# Patient Record
Sex: Female | Born: 1945
Health system: Southern US, Community
[De-identification: ages and names within clinical notes are randomized; demographics above are authoritative.]

## PROBLEM LIST (undated history)

## (undated) DIAGNOSIS — C801 Malignant (primary) neoplasm, unspecified: Secondary | ICD-10-CM

## (undated) DIAGNOSIS — J449 Chronic obstructive pulmonary disease, unspecified: Secondary | ICD-10-CM

## (undated) DIAGNOSIS — F32A Depression, unspecified: Secondary | ICD-10-CM

## (undated) DIAGNOSIS — E785 Hyperlipidemia, unspecified: Secondary | ICD-10-CM

## (undated) DIAGNOSIS — F329 Major depressive disorder, single episode, unspecified: Secondary | ICD-10-CM

## (undated) DIAGNOSIS — E119 Type 2 diabetes mellitus without complications: Secondary | ICD-10-CM

## (undated) HISTORY — DX: Chronic obstructive pulmonary disease, unspecified: J44.9

## (undated) HISTORY — DX: Depression, unspecified: F32.A

## (undated) HISTORY — DX: Malignant (primary) neoplasm, unspecified: C80.1

## (undated) HISTORY — DX: Type 2 diabetes mellitus without complications: E11.9

## (undated) HISTORY — DX: Major depressive disorder, single episode, unspecified: F32.9

## (undated) HISTORY — DX: Hyperlipidemia, unspecified: E78.5

## (undated) HISTORY — PX: CATARACT EXTRACTION: SUR2

---

## 2003-10-08 DIAGNOSIS — C801 Malignant (primary) neoplasm, unspecified: Secondary | ICD-10-CM

## 2003-10-08 HISTORY — DX: Malignant (primary) neoplasm, unspecified: C80.1

## 2003-10-08 HISTORY — PX: BREAST SURGERY: SHX581

## 2004-07-07 ENCOUNTER — Ambulatory Visit: Payer: Self-pay | Admitting: Internal Medicine

## 2004-08-07 ENCOUNTER — Ambulatory Visit: Payer: Self-pay | Admitting: Internal Medicine

## 2004-08-24 ENCOUNTER — Ambulatory Visit: Payer: Self-pay | Admitting: General Surgery

## 2004-09-06 ENCOUNTER — Ambulatory Visit: Payer: Self-pay | Admitting: Internal Medicine

## 2004-09-17 ENCOUNTER — Ambulatory Visit: Payer: Self-pay | Admitting: General Surgery

## 2004-10-07 ENCOUNTER — Ambulatory Visit: Payer: Self-pay | Admitting: Internal Medicine

## 2004-11-07 ENCOUNTER — Ambulatory Visit: Payer: Self-pay | Admitting: Internal Medicine

## 2004-11-26 ENCOUNTER — Other Ambulatory Visit: Payer: Self-pay

## 2004-11-26 ENCOUNTER — Emergency Department: Payer: Self-pay | Admitting: General Practice

## 2004-12-05 ENCOUNTER — Ambulatory Visit: Payer: Self-pay | Admitting: Internal Medicine

## 2005-01-05 ENCOUNTER — Ambulatory Visit: Payer: Self-pay | Admitting: Internal Medicine

## 2005-02-04 ENCOUNTER — Ambulatory Visit: Payer: Self-pay | Admitting: Internal Medicine

## 2005-02-08 ENCOUNTER — Ambulatory Visit: Payer: Self-pay | Admitting: General Surgery

## 2005-03-07 ENCOUNTER — Ambulatory Visit: Payer: Self-pay | Admitting: Internal Medicine

## 2005-04-06 ENCOUNTER — Ambulatory Visit: Payer: Self-pay | Admitting: Internal Medicine

## 2005-05-07 ENCOUNTER — Ambulatory Visit: Payer: Self-pay | Admitting: Internal Medicine

## 2005-06-07 ENCOUNTER — Ambulatory Visit: Payer: Self-pay | Admitting: Internal Medicine

## 2005-06-12 ENCOUNTER — Ambulatory Visit: Payer: Self-pay | Admitting: General Surgery

## 2005-07-07 ENCOUNTER — Ambulatory Visit: Payer: Self-pay | Admitting: Internal Medicine

## 2005-08-07 ENCOUNTER — Ambulatory Visit: Payer: Self-pay | Admitting: Internal Medicine

## 2005-09-06 ENCOUNTER — Ambulatory Visit: Payer: Self-pay | Admitting: Internal Medicine

## 2005-10-07 ENCOUNTER — Ambulatory Visit: Payer: Self-pay | Admitting: Internal Medicine

## 2005-12-03 ENCOUNTER — Ambulatory Visit: Payer: Self-pay | Admitting: Internal Medicine

## 2005-12-04 ENCOUNTER — Ambulatory Visit: Payer: Self-pay | Admitting: General Surgery

## 2005-12-05 ENCOUNTER — Ambulatory Visit: Payer: Self-pay | Admitting: Internal Medicine

## 2006-05-02 ENCOUNTER — Ambulatory Visit: Payer: Self-pay | Admitting: Internal Medicine

## 2006-05-07 ENCOUNTER — Ambulatory Visit: Payer: Self-pay | Admitting: Internal Medicine

## 2006-06-18 ENCOUNTER — Ambulatory Visit: Payer: Self-pay | Admitting: General Surgery

## 2006-12-02 ENCOUNTER — Ambulatory Visit: Payer: Self-pay | Admitting: Internal Medicine

## 2006-12-09 ENCOUNTER — Ambulatory Visit: Payer: Self-pay | Admitting: Family Medicine

## 2006-12-09 ENCOUNTER — Encounter: Payer: Self-pay | Admitting: Family Medicine

## 2006-12-09 ENCOUNTER — Other Ambulatory Visit: Admission: RE | Admit: 2006-12-09 | Discharge: 2006-12-09 | Payer: Self-pay | Admitting: Family Medicine

## 2007-03-03 ENCOUNTER — Ambulatory Visit: Payer: Self-pay | Admitting: General Surgery

## 2007-04-22 ENCOUNTER — Ambulatory Visit: Payer: Self-pay | Admitting: Family Medicine

## 2007-04-22 DIAGNOSIS — K8020 Calculus of gallbladder without cholecystitis without obstruction: Secondary | ICD-10-CM | POA: Insufficient documentation

## 2007-04-22 DIAGNOSIS — F17200 Nicotine dependence, unspecified, uncomplicated: Secondary | ICD-10-CM | POA: Insufficient documentation

## 2007-04-23 ENCOUNTER — Ambulatory Visit: Payer: Self-pay | Admitting: Internal Medicine

## 2007-04-28 LAB — CONVERTED CEMR LAB
ALT: 34 units/L (ref 0–35)
AST: 29 units/L (ref 0–37)
Albumin: 3.4 g/dL — ABNORMAL LOW (ref 3.5–5.2)
Alkaline Phosphatase: 102 units/L (ref 39–117)
BUN: 9 mg/dL (ref 6–23)
Basophils Absolute: 0.1 10*3/uL (ref 0.0–0.1)
Basophils Relative: 1.3 % — ABNORMAL HIGH (ref 0.0–1.0)
Bilirubin, Direct: 0.1 mg/dL (ref 0.0–0.3)
CO2: 28 meq/L (ref 19–32)
Calcium: 9.1 mg/dL (ref 8.4–10.5)
Chloride: 105 meq/L (ref 96–112)
Cholesterol: 236 mg/dL (ref 0–200)
Creatinine, Ser: 0.7 mg/dL (ref 0.4–1.2)
Direct LDL: 152.3 mg/dL
Eosinophils Absolute: 0.2 10*3/uL (ref 0.0–0.6)
Eosinophils Relative: 2.9 % (ref 0.0–5.0)
GFR calc Af Amer: 111 mL/min
GFR calc non Af Amer: 91 mL/min
Glucose, Bld: 127 mg/dL — ABNORMAL HIGH (ref 70–99)
HCT: 37.7 % (ref 36.0–46.0)
HDL: 31.6 mg/dL — ABNORMAL LOW (ref >39.0)
Hemoglobin: 13.5 g/dL (ref 12.0–15.0)
Hgb A1c MFr Bld: 5.9 % (ref 4.6–6.0)
Lymphocytes Relative: 25.9 % (ref 12.0–46.0)
MCHC: 35.9 g/dL (ref 30.0–36.0)
MCV: 92.1 fL (ref 78.0–100.0)
Monocytes Absolute: 0.3 10*3/uL (ref 0.2–0.7)
Monocytes Relative: 4.6 % (ref 3.0–11.0)
Neutro Abs: 3.9 10*3/uL (ref 1.4–7.7)
Neutrophils Relative %: 65.3 % (ref 43.0–77.0)
Platelets: 214 10*3/uL (ref 150–400)
Potassium: 4.3 meq/L (ref 3.5–5.1)
RBC: 4.1 M/uL (ref 3.87–5.11)
RDW: 12.7 % (ref 11.5–14.6)
Sodium: 140 meq/L (ref 135–145)
TSH: 2.38 microintl units/mL (ref 0.35–5.50)
Total Bilirubin: 0.6 mg/dL (ref 0.3–1.2)
Total CHOL/HDL Ratio: 7.5
Total Protein: 6.2 g/dL (ref 6.0–8.3)
Triglycerides: 398 mg/dL (ref 0–149)
VLDL: 80 mg/dL — ABNORMAL HIGH (ref 0–40)
WBC: 6.1 10*3/uL (ref 4.5–10.5)

## 2007-04-30 ENCOUNTER — Ambulatory Visit: Payer: Self-pay | Admitting: Family Medicine

## 2007-04-30 DIAGNOSIS — E782 Mixed hyperlipidemia: Secondary | ICD-10-CM

## 2007-04-30 DIAGNOSIS — E0849 Diabetes mellitus due to underlying condition with other diabetic neurological complication: Secondary | ICD-10-CM | POA: Insufficient documentation

## 2007-04-30 DIAGNOSIS — E1169 Type 2 diabetes mellitus with other specified complication: Secondary | ICD-10-CM | POA: Insufficient documentation

## 2007-05-05 ENCOUNTER — Telehealth (INDEPENDENT_AMBULATORY_CARE_PROVIDER_SITE_OTHER): Payer: Self-pay | Admitting: Internal Medicine

## 2007-07-06 ENCOUNTER — Ambulatory Visit: Payer: Self-pay | Admitting: Family Medicine

## 2007-07-07 LAB — CONVERTED CEMR LAB
BUN: 8 mg/dL (ref 6–23)
CO2: 30 meq/L (ref 19–32)
Calcium: 8.9 mg/dL (ref 8.4–10.5)
Chloride: 104 meq/L (ref 96–112)
Cholesterol: 186 mg/dL (ref 0–200)
Creatinine, Ser: 0.7 mg/dL (ref 0.4–1.2)
Direct LDL: 106.6 mg/dL
GFR calc Af Amer: 111 mL/min
GFR calc non Af Amer: 91 mL/min
Glucose, Bld: 126 mg/dL — ABNORMAL HIGH (ref 70–99)
HDL: 35.2 mg/dL — ABNORMAL LOW (ref >39.0)
Potassium: 4.2 meq/L (ref 3.5–5.1)
Sodium: 140 meq/L (ref 135–145)
Total CHOL/HDL Ratio: 5.3
Triglycerides: 344 mg/dL (ref 0–149)
VLDL: 69 mg/dL — ABNORMAL HIGH (ref 0–40)

## 2007-07-09 ENCOUNTER — Ambulatory Visit: Payer: Self-pay | Admitting: Family Medicine

## 2007-07-09 ENCOUNTER — Telehealth (INDEPENDENT_AMBULATORY_CARE_PROVIDER_SITE_OTHER): Payer: Self-pay | Admitting: *Deleted

## 2007-07-09 DIAGNOSIS — M79609 Pain in unspecified limb: Secondary | ICD-10-CM | POA: Insufficient documentation

## 2007-07-09 LAB — CONVERTED CEMR LAB
Cholesterol, target level: 200 mg/dL
HDL goal, serum: 40 mg/dL
LDL Goal: 130 mg/dL

## 2007-07-14 ENCOUNTER — Telehealth (INDEPENDENT_AMBULATORY_CARE_PROVIDER_SITE_OTHER): Payer: Self-pay | Admitting: *Deleted

## 2007-08-11 ENCOUNTER — Ambulatory Visit: Payer: Self-pay | Admitting: Family Medicine

## 2007-08-11 DIAGNOSIS — F331 Major depressive disorder, recurrent, moderate: Secondary | ICD-10-CM | POA: Insufficient documentation

## 2007-08-19 ENCOUNTER — Ambulatory Visit: Payer: Self-pay | Admitting: Family Medicine

## 2007-08-31 ENCOUNTER — Telehealth: Payer: Self-pay | Admitting: Family Medicine

## 2007-09-17 ENCOUNTER — Telehealth: Payer: Self-pay | Admitting: Family Medicine

## 2007-10-06 ENCOUNTER — Encounter: Payer: Self-pay | Admitting: Family Medicine

## 2007-11-12 ENCOUNTER — Ambulatory Visit: Payer: Self-pay | Admitting: General Surgery

## 2007-11-26 ENCOUNTER — Encounter: Payer: Self-pay | Admitting: Family Medicine

## 2008-01-11 ENCOUNTER — Telehealth: Payer: Self-pay | Admitting: Family Medicine

## 2008-01-11 ENCOUNTER — Encounter (INDEPENDENT_AMBULATORY_CARE_PROVIDER_SITE_OTHER): Payer: Self-pay | Admitting: *Deleted

## 2008-03-14 ENCOUNTER — Ambulatory Visit: Payer: Self-pay | Admitting: Family Medicine

## 2008-03-14 DIAGNOSIS — J449 Chronic obstructive pulmonary disease, unspecified: Secondary | ICD-10-CM | POA: Insufficient documentation

## 2008-03-22 LAB — CONVERTED CEMR LAB
ALT: 32 units/L (ref 0–35)
AST: 29 units/L (ref 0–37)
Albumin: 3.5 g/dL (ref 3.5–5.2)
Alkaline Phosphatase: 54 units/L (ref 39–117)
BUN: 10 mg/dL (ref 6–23)
Bilirubin, Direct: 0.1 mg/dL (ref 0.0–0.3)
CO2: 27 meq/L (ref 19–32)
Calcium: 9.3 mg/dL (ref 8.4–10.5)
Chloride: 102 meq/L (ref 96–112)
Cholesterol: 255 mg/dL (ref 0–200)
Creatinine, Ser: 0.9 mg/dL (ref 0.4–1.2)
Creatinine,U: 151.5 mg/dL
Direct LDL: 195.1 mg/dL
GFR calc Af Amer: 82 mL/min
GFR calc non Af Amer: 68 mL/min
Glucose, Bld: 122 mg/dL — ABNORMAL HIGH (ref 70–99)
HDL: 31.9 mg/dL — ABNORMAL LOW (ref >39.0)
Hgb A1c MFr Bld: 6.1 % — ABNORMAL HIGH (ref 4.6–6.0)
Microalb Creat Ratio: 2.6 mg/g (ref 0.0–30.0)
Microalb, Ur: 0.4 mg/dL (ref 0.0–1.9)
Potassium: 4.5 meq/L (ref 3.5–5.1)
Sodium: 139 meq/L (ref 135–145)
Total Bilirubin: 0.7 mg/dL (ref 0.3–1.2)
Total CHOL/HDL Ratio: 8
Total Protein: 6.9 g/dL (ref 6.0–8.3)
Triglycerides: 179 mg/dL — ABNORMAL HIGH (ref 0–149)
VLDL: 36 mg/dL (ref 0–40)

## 2008-03-30 ENCOUNTER — Ambulatory Visit: Payer: Self-pay | Admitting: Internal Medicine

## 2008-03-30 ENCOUNTER — Encounter: Payer: Self-pay | Admitting: Family Medicine

## 2008-04-06 ENCOUNTER — Ambulatory Visit: Payer: Self-pay | Admitting: Family Medicine

## 2008-04-06 DIAGNOSIS — M542 Cervicalgia: Secondary | ICD-10-CM | POA: Insufficient documentation

## 2008-04-12 ENCOUNTER — Ambulatory Visit (HOSPITAL_COMMUNITY): Admission: RE | Admit: 2008-04-12 | Discharge: 2008-04-12 | Payer: Self-pay | Admitting: *Deleted

## 2008-04-12 ENCOUNTER — Encounter: Payer: Self-pay | Admitting: Family Medicine

## 2008-05-03 ENCOUNTER — Telehealth: Payer: Self-pay | Admitting: Family Medicine

## 2008-06-24 ENCOUNTER — Ambulatory Visit: Payer: Self-pay | Admitting: Family Medicine

## 2008-06-24 DIAGNOSIS — D649 Anemia, unspecified: Secondary | ICD-10-CM | POA: Insufficient documentation

## 2008-06-24 DIAGNOSIS — R109 Unspecified abdominal pain: Secondary | ICD-10-CM | POA: Insufficient documentation

## 2008-06-24 LAB — CONVERTED CEMR LAB
ALT: 23 units/L (ref 0–35)
AST: 23 units/L (ref 0–37)
Albumin: 3.6 g/dL (ref 3.5–5.2)
Alkaline Phosphatase: 115 units/L (ref 39–117)
BUN: 8 mg/dL (ref 6–23)
Basophils Absolute: 0 10*3/uL (ref 0.0–0.1)
Basophils Relative: 0.6 % (ref 0.0–3.0)
Bilirubin Urine: NEGATIVE
Bilirubin, Direct: 0.2 mg/dL (ref 0.0–0.3)
Blood Glucose, Fasting: 145 mg/dL
CO2: 30 meq/L (ref 19–32)
Calcium: 9 mg/dL (ref 8.4–10.5)
Chloride: 105 meq/L (ref 96–112)
Creatinine, Ser: 0.7 mg/dL (ref 0.4–1.2)
Eosinophils Absolute: 0.3 10*3/uL (ref 0.0–0.7)
Eosinophils Relative: 3.6 % (ref 0.0–5.0)
GFR calc Af Amer: 110 mL/min
GFR calc non Af Amer: 91 mL/min
Glucose, Bld: 130 mg/dL — ABNORMAL HIGH (ref 70–99)
Glucose, Urine, Semiquant: NEGATIVE
HCT: 32.9 % — ABNORMAL LOW (ref 36.0–46.0)
Hemoglobin: 11.5 g/dL — ABNORMAL LOW (ref 12.0–15.0)
Hgb A1c MFr Bld: 5.9 % (ref 4.6–6.0)
Ketones, urine, test strip: NEGATIVE
Lymphocytes Relative: 18.7 % (ref 12.0–46.0)
MCHC: 34.9 g/dL (ref 30.0–36.0)
MCV: 94.7 fL (ref 78.0–100.0)
Monocytes Absolute: 0.5 10*3/uL (ref 0.1–1.0)
Monocytes Relative: 7.4 % (ref 3.0–12.0)
Neutro Abs: 5.2 10*3/uL (ref 1.4–7.7)
Neutrophils Relative %: 69.7 % (ref 43.0–77.0)
Nitrite: POSITIVE
Platelets: 202 10*3/uL (ref 150–400)
Potassium: 4.1 meq/L (ref 3.5–5.1)
Protein, U semiquant: 30
RBC: 3.47 M/uL — ABNORMAL LOW (ref 3.87–5.11)
RDW: 12.9 % (ref 11.5–14.6)
Sodium: 139 meq/L (ref 135–145)
Specific Gravity, Urine: <1.005
Total Bilirubin: 1.2 mg/dL (ref 0.3–1.2)
Total Protein: 7.4 g/dL (ref 6.0–8.3)
Urobilinogen, UA: 0.2
WBC: 7.4 10*3/uL (ref 4.5–10.5)
pH: 7.5

## 2008-06-25 ENCOUNTER — Encounter: Payer: Self-pay | Admitting: Family Medicine

## 2008-07-08 ENCOUNTER — Ambulatory Visit: Payer: Self-pay | Admitting: Family Medicine

## 2008-07-11 ENCOUNTER — Encounter: Admission: RE | Admit: 2008-07-11 | Discharge: 2008-07-11 | Payer: Self-pay | Admitting: Family Medicine

## 2008-07-12 LAB — CONVERTED CEMR LAB
Ferritin: 167.4 ng/mL (ref 10.0–291.0)
Folate: 10.6 ng/mL
Iron: 66 ug/dL (ref 42–145)
Saturation Ratios: 21.2 % (ref 20.0–50.0)
Transferrin: 222.8 mg/dL (ref >=212.0)
Vitamin B-12: 262 pg/mL (ref 211–911)

## 2008-07-13 ENCOUNTER — Telehealth: Payer: Self-pay | Admitting: Family Medicine

## 2008-07-20 ENCOUNTER — Encounter: Payer: Self-pay | Admitting: Family Medicine

## 2008-11-10 ENCOUNTER — Ambulatory Visit: Payer: Self-pay | Admitting: Family Medicine

## 2008-12-15 ENCOUNTER — Ambulatory Visit: Payer: Self-pay | Admitting: Family Medicine

## 2008-12-19 ENCOUNTER — Telehealth: Payer: Self-pay | Admitting: Family Medicine

## 2008-12-19 LAB — CONVERTED CEMR LAB
ALT: 40 units/L — ABNORMAL HIGH (ref 0–35)
AST: 33 units/L (ref 0–37)
Albumin: 3.9 g/dL (ref 3.5–5.2)
Alkaline Phosphatase: 110 units/L (ref 39–117)
BUN: 6 mg/dL (ref 6–23)
Bilirubin, Direct: 0.1 mg/dL (ref 0.0–0.3)
CO2: 29 meq/L (ref 19–32)
Calcium: 9.8 mg/dL (ref 8.4–10.5)
Chloride: 103 meq/L (ref 96–112)
Cholesterol: 243 mg/dL (ref 0–200)
Creatinine, Ser: 0.8 mg/dL (ref 0.4–1.2)
Direct LDL: 141.9 mg/dL
GFR calc Af Amer: 94 mL/min
GFR calc non Af Amer: 78 mL/min
Glucose, Bld: 124 mg/dL — ABNORMAL HIGH (ref 70–99)
HDL: 35.4 mg/dL — ABNORMAL LOW (ref >39.0)
Hgb A1c MFr Bld: 6.2 % — ABNORMAL HIGH (ref 4.6–6.0)
Potassium: 4.9 meq/L (ref 3.5–5.1)
Sodium: 141 meq/L (ref 135–145)
Total Bilirubin: 0.9 mg/dL (ref 0.3–1.2)
Total CHOL/HDL Ratio: 6.9
Total Protein: 6.9 g/dL (ref 6.0–8.3)
Triglycerides: 396 mg/dL (ref 0–149)
VLDL: 79 mg/dL — ABNORMAL HIGH (ref 0–40)

## 2008-12-20 ENCOUNTER — Encounter (INDEPENDENT_AMBULATORY_CARE_PROVIDER_SITE_OTHER): Payer: Self-pay | Admitting: *Deleted

## 2009-01-10 ENCOUNTER — Encounter: Payer: Self-pay | Admitting: Family Medicine

## 2009-01-10 ENCOUNTER — Telehealth: Payer: Self-pay | Admitting: Family Medicine

## 2009-01-16 ENCOUNTER — Telehealth: Payer: Self-pay | Admitting: Family Medicine

## 2009-01-20 ENCOUNTER — Telehealth: Payer: Self-pay | Admitting: Family Medicine

## 2009-02-02 ENCOUNTER — Ambulatory Visit: Payer: Self-pay | Admitting: Family Medicine

## 2009-02-02 DIAGNOSIS — K5900 Constipation, unspecified: Secondary | ICD-10-CM | POA: Insufficient documentation

## 2009-02-24 ENCOUNTER — Ambulatory Visit: Payer: Self-pay | Admitting: Family Medicine

## 2009-02-24 LAB — CONVERTED CEMR LAB
ALT: 31 units/L (ref 0–35)
AST: 26 units/L (ref 0–37)
Cholesterol: 188 mg/dL (ref 0–200)
Creatinine,U: 95.6 mg/dL
Direct LDL: 117.7 mg/dL
HDL: 35.6 mg/dL — ABNORMAL LOW (ref >39.00)
Hgb A1c MFr Bld: 6.2 % (ref 4.6–6.5)
Microalb Creat Ratio: 4.2 mg/g (ref 0.0–30.0)
Microalb, Ur: 0.4 mg/dL (ref 0.0–1.9)
Total CHOL/HDL Ratio: 5
Triglycerides: 308 mg/dL — ABNORMAL HIGH (ref 0.0–149.0)
VLDL: 61.6 mg/dL — ABNORMAL HIGH (ref 0.0–40.0)

## 2009-02-28 ENCOUNTER — Ambulatory Visit: Payer: Self-pay | Admitting: Family Medicine

## 2009-02-28 ENCOUNTER — Encounter: Payer: Self-pay | Admitting: Family Medicine

## 2009-02-28 DIAGNOSIS — C50919 Malignant neoplasm of unspecified site of unspecified female breast: Secondary | ICD-10-CM | POA: Insufficient documentation

## 2009-02-28 DIAGNOSIS — R209 Unspecified disturbances of skin sensation: Secondary | ICD-10-CM | POA: Insufficient documentation

## 2009-02-28 LAB — CONVERTED CEMR LAB: LDL Goal: 100 mg/dL

## 2009-03-01 ENCOUNTER — Other Ambulatory Visit: Admission: RE | Admit: 2009-03-01 | Discharge: 2009-03-01 | Payer: Self-pay | Admitting: Family Medicine

## 2009-03-02 ENCOUNTER — Ambulatory Visit: Payer: Self-pay | Admitting: Family Medicine

## 2009-03-02 ENCOUNTER — Encounter: Payer: Self-pay | Admitting: Family Medicine

## 2009-03-03 ENCOUNTER — Encounter (INDEPENDENT_AMBULATORY_CARE_PROVIDER_SITE_OTHER): Payer: Self-pay | Admitting: *Deleted

## 2009-03-07 ENCOUNTER — Encounter (INDEPENDENT_AMBULATORY_CARE_PROVIDER_SITE_OTHER): Payer: Self-pay | Admitting: *Deleted

## 2009-05-26 ENCOUNTER — Ambulatory Visit: Payer: Self-pay | Admitting: Family Medicine

## 2009-05-26 LAB — CONVERTED CEMR LAB
Cholesterol: 136 mg/dL (ref 0–200)
Direct LDL: 70.7 mg/dL
HDL: 35 mg/dL — ABNORMAL LOW (ref >39.00)
Hgb A1c MFr Bld: 6.9 % — ABNORMAL HIGH (ref 4.6–6.5)
LDL Cholesterol: 70 mg/dL
Total CHOL/HDL Ratio: 4
Triglycerides: 277 mg/dL — ABNORMAL HIGH (ref 0.0–149.0)
VLDL: 55.4 mg/dL — ABNORMAL HIGH (ref 0.0–40.0)

## 2009-05-29 ENCOUNTER — Ambulatory Visit: Payer: Self-pay | Admitting: Family Medicine

## 2009-08-28 ENCOUNTER — Ambulatory Visit: Payer: Self-pay | Admitting: Family Medicine

## 2009-08-29 LAB — CONVERTED CEMR LAB: Hgb A1c MFr Bld: 7.9 % — ABNORMAL HIGH (ref 4.6–6.5)

## 2009-09-05 ENCOUNTER — Ambulatory Visit: Payer: Self-pay | Admitting: Family Medicine

## 2009-09-05 DIAGNOSIS — I10 Essential (primary) hypertension: Secondary | ICD-10-CM | POA: Insufficient documentation

## 2009-09-05 DIAGNOSIS — R0789 Other chest pain: Secondary | ICD-10-CM | POA: Insufficient documentation

## 2009-09-11 ENCOUNTER — Telehealth (INDEPENDENT_AMBULATORY_CARE_PROVIDER_SITE_OTHER): Payer: Self-pay | Admitting: Radiology

## 2009-09-12 ENCOUNTER — Encounter (HOSPITAL_COMMUNITY): Admission: RE | Admit: 2009-09-12 | Discharge: 2009-10-06 | Payer: Self-pay | Admitting: Family Medicine

## 2009-09-12 ENCOUNTER — Ambulatory Visit: Payer: Self-pay | Admitting: Internal Medicine

## 2009-09-12 ENCOUNTER — Ambulatory Visit: Payer: Self-pay

## 2009-09-14 ENCOUNTER — Telehealth: Payer: Self-pay | Admitting: Family Medicine

## 2009-09-19 ENCOUNTER — Ambulatory Visit: Payer: Self-pay | Admitting: Family Medicine

## 2009-09-22 LAB — CONVERTED CEMR LAB
BUN: 5 mg/dL — ABNORMAL LOW (ref 6–23)
CO2: 27 meq/L (ref 19–32)
Calcium: 9.8 mg/dL (ref 8.4–10.5)
Chloride: 102 meq/L (ref 96–112)
Creatinine, Ser: 0.6 mg/dL (ref 0.4–1.2)
GFR calc non Af Amer: 107.98 mL/min (ref >60)
Glucose, Bld: 107 mg/dL — ABNORMAL HIGH (ref 70–99)
Potassium: 4.4 meq/L (ref 3.5–5.1)
Sodium: 138 meq/L (ref 135–145)

## 2009-09-25 ENCOUNTER — Ambulatory Visit: Payer: Self-pay | Admitting: Cardiology

## 2009-09-27 ENCOUNTER — Telehealth: Payer: Self-pay | Admitting: Family Medicine

## 2009-09-27 DIAGNOSIS — J984 Other disorders of lung: Secondary | ICD-10-CM | POA: Insufficient documentation

## 2009-12-04 ENCOUNTER — Ambulatory Visit: Payer: Self-pay | Admitting: Family Medicine

## 2009-12-05 LAB — CONVERTED CEMR LAB
ALT: 33 units/L (ref 0–35)
AST: 29 units/L (ref 0–37)
Albumin: 3.8 g/dL (ref 3.5–5.2)
Alkaline Phosphatase: 90 units/L (ref 39–117)
BUN: 4 mg/dL — ABNORMAL LOW (ref 6–23)
Bilirubin, Direct: 0.1 mg/dL (ref 0.0–0.3)
CO2: 28 meq/L (ref 19–32)
Calcium: 9.2 mg/dL (ref 8.4–10.5)
Chloride: 104 meq/L (ref 96–112)
Cholesterol: 255 mg/dL — ABNORMAL HIGH (ref 0–200)
Creatinine, Ser: 0.7 mg/dL (ref 0.4–1.2)
Direct LDL: 152.5 mg/dL
GFR calc non Af Amer: 90.32 mL/min (ref >60)
Glucose, Bld: 159 mg/dL — ABNORMAL HIGH (ref 70–99)
HDL: 42.2 mg/dL (ref >39.00)
Hgb A1c MFr Bld: 7.2 % — ABNORMAL HIGH (ref 4.6–6.5)
Potassium: 4.3 meq/L (ref 3.5–5.1)
Sodium: 138 meq/L (ref 135–145)
TSH: 2.16 microintl units/mL (ref 0.35–5.50)
Total Bilirubin: 0.5 mg/dL (ref 0.3–1.2)
Total CHOL/HDL Ratio: 6
Total Protein: 6.7 g/dL (ref 6.0–8.3)
Triglycerides: 480 mg/dL — ABNORMAL HIGH (ref 0.0–149.0)
VLDL: 96 mg/dL — ABNORMAL HIGH (ref 0.0–40.0)

## 2009-12-08 ENCOUNTER — Ambulatory Visit: Payer: Self-pay | Admitting: Family Medicine

## 2010-01-03 ENCOUNTER — Telehealth: Payer: Self-pay | Admitting: Family Medicine

## 2010-01-03 ENCOUNTER — Encounter: Payer: Self-pay | Admitting: Family Medicine

## 2010-01-18 ENCOUNTER — Telehealth: Payer: Self-pay | Admitting: Family Medicine

## 2010-01-19 ENCOUNTER — Telehealth: Payer: Self-pay | Admitting: Family Medicine

## 2010-02-21 ENCOUNTER — Ambulatory Visit: Payer: Self-pay | Admitting: Family Medicine

## 2010-02-23 ENCOUNTER — Telehealth: Payer: Self-pay | Admitting: Family Medicine

## 2010-02-23 ENCOUNTER — Ambulatory Visit: Payer: Self-pay | Admitting: Family Medicine

## 2010-02-27 ENCOUNTER — Encounter: Payer: Self-pay | Admitting: Family Medicine

## 2010-02-28 LAB — CONVERTED CEMR LAB
ALT: 20 units/L (ref 0–35)
AST: 25 units/L (ref 0–37)
Albumin: 4.4 g/dL (ref 3.5–5.2)
Alkaline Phosphatase: 97 units/L (ref 39–117)
BUN: 10 mg/dL (ref 6–23)
Bilirubin, Direct: 0.1 mg/dL (ref 0.0–0.3)
CO2: 27 meq/L (ref 19–32)
Calcium: 9.3 mg/dL (ref 8.4–10.5)
Chloride: 101 meq/L (ref 96–112)
Cholesterol: 131 mg/dL (ref 0–200)
Creatinine, Ser: 0.8 mg/dL (ref 0.4–1.2)
Creatinine,U: 227 mg/dL
Direct LDL: 69 mg/dL
GFR calc non Af Amer: 83.35 mL/min (ref >60)
Glucose, Bld: 133 mg/dL — ABNORMAL HIGH (ref 70–99)
HDL: 36.4 mg/dL — ABNORMAL LOW (ref >39.00)
Hgb A1c MFr Bld: 6.4 % (ref 4.6–6.5)
Microalb Creat Ratio: 0.6 mg/g (ref 0.0–30.0)
Microalb, Ur: 1.3 mg/dL (ref 0.0–1.9)
Potassium: 4 meq/L (ref 3.5–5.1)
Sodium: 140 meq/L (ref 135–145)
Total Bilirubin: 0.5 mg/dL (ref 0.3–1.2)
Total CHOL/HDL Ratio: 4
Total Protein: 6.9 g/dL (ref 6.0–8.3)
Triglycerides: 236 mg/dL — ABNORMAL HIGH (ref 0.0–149.0)
VLDL: 47.2 mg/dL — ABNORMAL HIGH (ref 0.0–40.0)

## 2010-03-02 ENCOUNTER — Telehealth: Payer: Self-pay | Admitting: Family Medicine

## 2010-03-02 ENCOUNTER — Ambulatory Visit: Payer: Self-pay | Admitting: Family Medicine

## 2010-03-20 ENCOUNTER — Telehealth: Payer: Self-pay | Admitting: Family Medicine

## 2010-03-20 ENCOUNTER — Ambulatory Visit: Payer: Self-pay | Admitting: Family Medicine

## 2010-03-21 LAB — CONVERTED CEMR LAB
Creatinine, Ser: 0.6 mg/dL (ref 0.4–1.2)
Uric Acid, Serum: 4.6 mg/dL (ref 2.4–7.0)

## 2010-03-27 ENCOUNTER — Ambulatory Visit: Payer: Self-pay | Admitting: Family Medicine

## 2010-03-27 ENCOUNTER — Encounter: Payer: Self-pay | Admitting: Family Medicine

## 2010-03-27 ENCOUNTER — Ambulatory Visit: Payer: Self-pay | Admitting: Cardiology

## 2010-03-29 ENCOUNTER — Telehealth: Payer: Self-pay | Admitting: Family Medicine

## 2010-11-06 NOTE — Letter (Signed)
Summary: Results Follow-up Letter  [place] [facility]  [address] [place], [place] [address]   Phone: [phone]  Fax: [phone]    03/07/2009     [place], [place]  [address]    Dear Ms. Theodoro Clock,   The following are the results of your recent test(s):  Test     Result     Pap Smear    Normal_______  Not Normal_____       Comments: _________________________________________________________ Cholesterol LDL(Bad cholesterol):          Your goal is less than:         HDL (Good cholesterol):        Your goal is more than: _________________________________________________________ Other Tests:  Bone Density test was normal repeat in 2 years _________________________________________________________  Please call for an appointment Or _________________________________________________________ _________________________________________________________ _________________________________________________________  Sincerely,  Zenda Alpers CMA [place] at [facility]

## 2010-11-06 NOTE — Assessment & Plan Note (Signed)
Summary: BODY ACHES/CLE   Vital Signs:  Patient Profile:   65 Years Old Female Weight:      147 pounds Temp:     97.9 degrees F oral Pulse rate:   88 / minute Pulse rhythm:   regular BP sitting:   120 / 70  (left arm) Cuff size:   regular  Vitals Entered By: Marty Heck (June 24, 2008 9:21 AM)                 Chief Complaint:  Pain on both sides x 2 weeks.  History of Present Illness: In past few weeks, has had bilateral flank achyness, low back pain. Last night started with a subjective fever, some increase sweating throughout the day Fatigue, weakness Some increase in cough, no wheezing, mucus is thicker Coughing makes it worse, no worse with movement no face pain, no ear pain no dysuria, no abdominal pain several days of constipation Has borderline DM atlast check, never kept appt with nutritionist.  Stopped spiriva  3 weeks ago because she feels that it was causing more cough and phlegm. Started working out with [employer] several weeks ago    Current Allergies: No known allergies   Past Medical History:    Reviewed history from 04/06/2008 and no changes required:       Current Problems:        COPD, MILD (ICD-496)       DEPRESSION, ACUTE, RECURRENT (ICD-296.33)       FOOT PAIN, BILATERAL (ICD-729.5)       HYPERGLYCEMIA (ICD-790.29)       HYPERLIPIDEMIA, MIXED (ICD-272.2)       EXAMINATION, ROUTINE MEDICAL (ICD-V70.0)       HYPERCHOLESTEROLEMIA, PURE (ICD-272.0)       CIGARETTE SMOKER (ICD-305.1)       CHOLELITHIASIS W/O CHOLECYSTITIS W/O OBST (ICD-574.20)           Social History:    Reviewed history from 04/06/2008 and no changes required:       Current Smoker    Review of Systems      See HPI   Physical Exam  General:     overweight appearing female in NAD Mouth:     Oral mucosa and oropharynx without lesions or exudates.  Teeth in good repair. MMM Lungs:     Normal respiratory effort, chest expands symmetrically. Lungs are clear  to auscultation, no crackles or wheezes. Heart:     Normal rate and regular rhythm. S1 and S2 normal without gallop, murmur, click, rub or other extra sounds. Abdomen:     diffuse mild ttp, B CVA tenderness mildlyno guarding, no rigidity, no inguinal hernia, no hepatomegaly, and no splenomegaly.   Pulses:     R and L posterior tibial pulses are full and equal bilaterally  Skin:     Intact without suspicious lesions or rashes Cervical Nodes:     no cervical or supraclavicular lymphadenopathy     Impression & Recommendations:  Problem # 1:  FLANK PAIN (ICD-789.09) Bilateral. Given weakness, cough and fever; this may all be due to viral syndrome. No specific findings on PE.  She does not feel like she is having a COPD exacerbation. Will treat with symptomatic care.  Check cbc, CMET to make sure no sign of bacterial infection.  The following medications were removed from the medication list:    Diclofenac Sodium 75 Mg Tbec (Diclofenac sodium) .[name]... 1 tab by mouth two times a day    Flexeril 5 Mg  Tabs (Cyclobenzaprine hcl) .[name]... 1-2 tab by mouth .at bedtime as needed muscle spasm  Orders: TLB-CBC Platelet - w/Differential (85025-CBCD) TLB-Hepatic/Liver Function Pnl (80076-HEPATIC) TLB-BMP (Basic Metabolic Panel-BMET) (99991111) T-Culture, Urine [id number])   Problem # 2:  DIABETES MELLITUS, TYPE II (ICD-250.00) She has diagnosis of diabetes. We will check A1C again to determine if  medication is needed. This may be contributing to her feeling poorly. Encouraged exercise, weight loss, healthy eating habits.  Labs Reviewed: HgBA1c: 6.1 (03/14/2008)   Creat: 0.9 (03/14/2008)   Microalbumin: 0.4 (03/14/2008)   Complete Medication List: 1)  Neurontin 300 Mg Caps (Gabapentin) .... Take 3 capsules by mouth at bedtime 2)  Cymbalta 30 Mg Cpep (Duloxetine hcl) .... Take 1 tablet by mouth bid 3)  Lipitor 40 Mg Tabs (Atorvastatin calcium) .... Take 1 tablet by mouth once a day 4)  Vit E  120 Mg  .... Take by mouth daily 5)  Magnesium  .... Take by mouth as directed 6)  Chromium  .... Take by mouth as directed  Other Orders: UA Dipstick W/ Micro (manual) (81000) T-Culture, Urine [id number]) TLB-A1C / Hgb A1C (Glycohemoglobin) (83036-A1C) TLB-Lipid Panel (80061-LIPID)    ] Prior Medications (reviewed today): NEURONTIN 300 MG  CAPS (GABAPENTIN) Take 3 capsules by mouth at bedtime CYMBALTA 30 MG  CPEP (DULOXETINE HCL) Take 1 tablet by mouth BID LIPITOR 40 MG  TABS (ATORVASTATIN CALCIUM) Take 1 tablet by mouth once a day VIT E 120 MG () take by mouth daily MAGNESIUM () take by mouth as directed CHROMIUM () take by mouth as directed Current Allergies: No known allergies   Laboratory Results   Urine Tests  Date/Time Received: June 24, 2008 9:49 AM  Date/Time Reported: June 24, 2008 9:49 AM   Routine Urinalysis   Color: yellow Appearance: Hazy Glucose: negative   (Normal Range: Negative) Bilirubin: negative   (Normal Range: Negative) Ketone: negative   (Normal Range: Negative) Spec. Gravity: <1.005   (Normal Range: 1.003-1.035) Blood: trace-intact   (Normal Range: Negative) pH: 7.5   (Normal Range: 5.0-8.0) Protein: 30   (Normal Range: Negative) Urobilinogen: 0.2   (Normal Range: 0-1) Nitrite: positive   (Normal Range: Negative) Leukocyte Esterace: small   (Normal Range: Negative)  Urine Microscopic WBC/HPF: 2-3 RBC/HPF: 2-3 Bacteria/HPF: many Epithelial/HPF: several    Comments: porr quality sample  Blood Tests    Time patient last ate: yesterday  Glucose (fasting): 145 mg/dL   (Normal Range: 70-105)      Laboratory Results   Urine Tests    Routine Urinalysis   Color: yellow Appearance: Hazy Glucose: negative   (Normal Range: Negative) Bilirubin: negative   (Normal Range: Negative) Ketone: negative   (Normal Range: Negative) Spec. Gravity: <1.005   (Normal Range: 1.003-1.035) Blood: trace-intact   (Normal Range:  Negative) pH: 7.5   (Normal Range: 5.0-8.0) Protein: 30   (Normal Range: Negative) Urobilinogen: 0.2   (Normal Range: 0-1) Nitrite: positive   (Normal Range: Negative) Leukocyte Esterace: small   (Normal Range: Negative)  Urine Microscopic WBC/hpf: 2-3 RBC/hpf: 2-3 Bacteria: many Epithelial: several    Comments: porr quality sample  Blood Tests     Glucose (fasting): 145 mg/dL   (Normal Range: 70-105)

## 2010-11-06 NOTE — Assessment & Plan Note (Signed)
Summary: FOLLOW UP   Vital Signs:  Patient Profile:   65 Years Old Female Weight:      152 pounds Temp:     97.8 degrees F oral Pulse rate:   80 / minute Pulse rhythm:   regular BP sitting:   126 / 78  (left arm) Cuff size:   large  Vitals Entered By: Christena Deem (August 11, 2007 3:10 PM)                 Chief Complaint:  Follow up.  History of Present Illness: borderline diabetes ache in both feet, top and bottom x 6-8 months no weakness in legs hurts to stand, ball of foot painful no numbness, tingling no low back pain wants to hold off on nerve conduction studies trial of neurontin 300mg  by mouth qHS Has had 90% improvement in foot pain no new symptoms associated  She has been irritable at work  worsening depression, no insomnia some anxiety, tearfulness worst at work no SI slowed thinking no hallucinations some paranoia On Effexor since May 2005 has been on celexa, zoloft, wellbutrin saw pshychiatrist in past in [place] at [facility] (reviewed today): No known allergies   Past Medical History:    Reviewed history and no changes required:       Current Problems:        DEPRESSION, ACUTE, RECURRENT (ICD-296.33)       FOOT PAIN, BILATERAL (ICD-729.5)       HYPERGLYCEMIA (ICD-790.29)       HYPERLIPIDEMIA, MIXED (ICD-272.2)       EXAMINATION, ROUTINE MEDICAL (ICD-V70.0)       HYPERCHOLESTEROLEMIA, PURE (ICD-272.0)       CIGARETTE SMOKER (ICD-305.1)       CHOLELITHIASIS W/O CHOLECYSTITIS W/O OBST (ICD-574.20)             Review of Systems      See HPI   Physical Exam  General:     overweight-appearing.   Psych:     Oriented X3 and memory intact for recent and remote, tearful    Impression & Recommendations:  Problem # 1:  DEPRESSION, ACUTE, RECURRENT (ICD-296.33) Will wean off effexor and try wellbutrin.  She is interested in this med because she would also like to stop smoking. No SI/HI.   Discussed med benifits, treatment course and SE. Close follow up.  Problem # 2:  FOOT PAIN, BILATERAL (ICD-729.5) much improved with neurontin.    Complete Medication List: 1)  Femara 2.5 Mg Tabs (Letrozole) .... Take 1 tablet by mouth once a day 2)  Fenofibrate 160 Mg Tabs (Fenofibrate) .... Take 1 tablet by mouth once a day 3)  Lamisil 250 Mg Tabs (Terbinafine hcl) .... Take 1 tablet by mouth once a day x 6weeks 4)  Neurontin 300 Mg Caps (Gabapentin) .[name].. 1 tab by mouth at bedtime, then may increase to two times a day after 1 week 5)  Wellbutrin Xl 150 Mg Tb24 (Bupropion hcl) .... Take 1 tablet by mouth once a day   Patient Instructions: 1)  Please schedule a follow-up appointment in 3 weeks f/up mood. 2)  Wean off effexor as instructed, then start wellbutrin.    Prescriptions: WELLBUTRIN XL 150 MG  TB24 (BUPROPION HCL) Take 1 tablet by mouth once a day  #30 x 0   Entered and Authorized by:   Eliezer Lofts MD   Signed by:   Eliezer Lofts MD on 08/11/2007   Method used:  Electronically sent to ...       [facility]       [place], [place]  [address]       Ph: [phone] or [phone]       Fax: [phone]   RxID:   [id number]  ] Current Allergies (reviewed today): No known allergies  Current Medications (including changes made in today's visit):  FEMARA 2.5 MG TABS (LETROZOLE) Take 1 tablet by mouth once a day FENOFIBRATE 160 MG  TABS (FENOFIBRATE) Take 1 tablet by mouth once a day LAMISIL 250 MG  TABS (TERBINAFINE HCL) Take 1 tablet by mouth once a day x 6weeks NEURONTIN 300 MG  CAPS (GABAPENTIN) 1 tab by mouth at bedtime, then may increase to two times a day after 1 week WELLBUTRIN XL 150 MG  TB24 (BUPROPION HCL) Take 1 tablet by mouth once a day

## 2010-11-06 NOTE — Assessment & Plan Note (Signed)
Summary: ? on rx/rbh   Vital Signs:  Patient Profile:   65 Years Old Female Weight:      151.13 pounds Temp:     98.2 degrees F oral Pulse rate:   92 / minute Pulse rhythm:   regular BP sitting:   106 / 70  (left arm) Cuff size:   regular  Vitals Entered By: Christena Deem (March 14, 2008 9:08 AM)                 Chief Complaint:  Follow up.  History of Present Illness: Few days after starting Chantix  headaches and anxiety she has now stopped the Chantix, her symptoms have improved--no furhter headaches previously had been doing fairly well on Cymbalta, 50% improvement no SI/no HI still with depression, some insomnia she was fired from her job   SOB with exercising, still smoking chronic smokers cough family members have COPD and she is interested in inhaler for breathing.    Current Allergies (reviewed today): No known allergies   Past Medical History:    Reviewed history from 08/11/2007 and no changes required:       Current Problems:        DEPRESSION, ACUTE, RECURRENT (ICD-296.33)       FOOT PAIN, BILATERAL (ICD-729.5)       HYPERGLYCEMIA (ICD-790.29)       HYPERLIPIDEMIA, MIXED (ICD-272.2)       EXAMINATION, ROUTINE MEDICAL (ICD-V70.0)       HYPERCHOLESTEROLEMIA, PURE (ICD-272.0)       CIGARETTE SMOKER (ICD-305.1)       CHOLELITHIASIS W/O CHOLECYSTITIS W/O OBST (ICD-574.20)              Physical Exam  General:     Well-developed,well-nourished,in no acute distress; alert,appropriate and cooperative throughout examination Mouth:     Oral mucosa and oropharynx without lesions or exudates.  Teeth in good repair. Neck:     no carotid bruit or thyromegaly n Lungs:     Normal respiratory effort, chest expands symmetrically. Lungs are clear to auscultation, no crackles or wheezes. Heart:     Normal rate and regular rhythm. S1 and S2 normal without gallop, murmur, click, rub or other extra sounds. Pulses:     R and L posterior tibial pulses are full  and equal bilaterally  Extremities:     no edema  Diabetes Management Exam:    Foot Exam (with socks and/or shoes not present):       Sensory-Pinprick/Light touch:          Left medial foot (L-4): normal          Left dorsal foot (L-5): normal          Left lateral foot (S-1): normal          Right medial foot (L-4): normal          Right dorsal foot (L-5): normal          Right lateral foot (S-1): normal       Sensory-Monofilament:          Left foot: normal          Right foot: normal       Inspection:          Left foot: normal          Right foot: normal       Nails:          Left foot: normal  Right foot: normal    Impression & Recommendations:  Problem # 1:  DEPRESSION, ACUTE, RECURRENT (ICD-296.33) poorly controlled, will increase to 60 mg daily on cymbalta. Follow up in 1 months, or earlier if needed.  Problem # 2:  HYPERLIPIDEMIA, MIXED (ICD-272.2) She has been working on lifestyle change. Due for reeval. Her updated medication list for this problem includes:    Fenofibrate 160 Mg Tabs (Fenofibrate) .[name]... Take 1 tablet by mouth once a day  Orders: TLB-BMP (Basic Metabolic Panel-BMET) (99991111) TLB-Hepatic/Liver Function Pnl (80076-HEPATIC) TLB-Lipid Panel (80061-LIPID)   Problem # 3:  HYPERGLYCEMIA (ICD-790.29) Will eval A1C given almost diagnosis of DM with fasting blood sugar. Encouraged exercise, weight loss, healthy eating habits.  Orders: TLB-A1C / Hgb A1C (Glycohemoglobin) (83036-A1C) TLB-Microalbumin/Creat Ratio, Urine (82043-MALB)   Problem # 4:  DYSPNEA ON EXERTION (ICD-786.09) Likely COPD, but spirometry poor quality and poor pt effort.   Will send for full PFTs. Likely needs Spiriva, as well as smoking cessation. Orders: Spirometry w/Graph (94010) Misc. Referral (Misc. Ref)   Complete Medication List: 1)  Fenofibrate 160 Mg Tabs (Fenofibrate) .... Take 1 tablet by mouth once a day 2)  Neurontin 300 Mg Caps (Gabapentin) ....  Take 3 capsules by mouth at bedtime 3)  Cymbalta 30 Mg Cpep (Duloxetine hcl) .... Take 1 tablet by mouth bid   Patient Instructions: 1)  Prescriptions sent to [facility].  2)  Follow up appt in 1 month mood eval.  3)  Referral Appointment Information 4)  Day/Date: 5)  Time: 6)  Place/MD: 7)  Address: 8)  Phone/Fax: 9)  Patient given appointment information. Information/Orders faxed/mailed.    Prescriptions: FENOFIBRATE 160 MG  TABS (FENOFIBRATE) Take 1 tablet by mouth once a day  #30 x 6   Entered and Authorized by:   Eliezer Lofts MD   Signed by:   Eliezer Lofts MD on 03/14/2008   Method used:   Electronically sent to ...       [facility]       [place], [place]  [address]       Ph: [phone] or [phone]       Fax: [phone]   RxID:   [id number] NEURONTIN 300 MG  CAPS (GABAPENTIN) Take 3 capsules by mouth at bedtime  #90 x 6   Entered and Authorized by:   Eliezer Lofts MD   Signed by:   Eliezer Lofts MD on 03/14/2008   Method used:   Electronically sent to ...       [facility]       [place], [place]  [address]       Ph: [phone] or [phone]       Fax: [phone]   RxID:   [id number] CYMBALTA 30 MG  CPEP (DULOXETINE HCL) Take 1 tablet by mouth BID  #60 x 6   Entered and Authorized by:   Eliezer Lofts MD   Signed by:   Eliezer Lofts MD on 03/14/2008   Method used:   Electronically sent to ...       [facility]       [place], [place]  [address]       Ph: [phone] or [phone]  Fax: [phone]   [id number]  ] Current Allergies (reviewed today): No known allergies  Current Medications (including changes made in today's visit):  FENOFIBRATE 160 MG  TABS (FENOFIBRATE) Take 1 tablet by mouth once a day NEURONTIN 300 MG  CAPS (GABAPENTIN) Take 3 capsules by mouth at bedtime CYMBALTA 30 MG  CPEP  (DULOXETINE HCL) Take 1 tablet by mouth BID

## 2010-11-06 NOTE — Miscellaneous (Signed)
  Clinical Lists Changes  Medications: Added new medication of CYMBALTA 30 MG CPEP (DULOXETINE HCL) Take 1 capsule by mouth two times a day

## 2010-11-06 NOTE — Progress Notes (Signed)
Summary: refill, referra  Phone Note Call from Patient Call back at [facility] Phone [phone]   Caller: Patient Call For: Gary Bultman Summary of Call: pt wants a referral to a podiatrist, also needs refill of gabepentin, and is requesting more lamisil. She says a friend of her is also on lamisil and she was told the dose she was on was for fingernail fungus not toenails. Initial call taken by: Daralene Milch,  September 17, 2007 9:41 AM  Follow-up for Phone Call        Referral sent. Dose is the same for toenails but may need to continue it out 12 weeks for toenails.  Notify patient that the below prescriptions have been sent to the pharmacy electronically.       Prescriptions: NEURONTIN 300 MG  CAPS (GABAPENTIN) 1 tab by mouth at bedtime, then may increase to two times a day after 1 week  #60 x 5   Entered and Authorized by:   Eliezer Lofts MD   Signed by:   Eliezer Lofts MD on 09/17/2007   Method used:   Telephoned to ...         [id number] LAMISIL 250 MG  TABS (TERBINAFINE HCL) Take 1 tablet by mouth once a day x 6weeks  #30 x 1   Entered and Authorized by:   Eliezer Lofts MD   Signed by:   Eliezer Lofts MD on 09/17/2007   Method used:   Telephoned to ...         [id number]:[id number]   Patient Advised. ..................................................................Marland KitchenChristena Deem  September 17, 2007 11:37 AM  Appended Document: refill, referra Pt called to advise that the pharmacy did not get the rx's sent in on 09/17/07.  Rx's called to [facility] at pt's request.

## 2010-11-06 NOTE — Consult Note (Signed)
Summary: [facility]/ Report/Maggie May   Imported By: Genice Rouge 07/21/2008 12:33:39  _____________________________________________________________________  External Attachment:    Type:   Image     Comment:   External Document

## 2010-11-06 NOTE — Miscellaneous (Signed)
  Clinical Lists Changes  Medications: Added new medication of CHANTIX 1 MG  TABS (VARENICLINE TARTRATE) Take one tablet by mouth two times a day. - Signed Rx of CHANTIX 1 MG  TABS (VARENICLINE TARTRATE) Take one tablet by mouth two times a day.;  #56 x 0;  Signed;  Entered by: Christena Deem;  Authorized by: Eliezer Lofts MD;  Method used: Print then Give to Patient    Prescriptions: CHANTIX 1 MG  TABS (VARENICLINE TARTRATE) Take one tablet by mouth two times a day.  #56 x 0   Entered by:   Christena Deem   Authorized by:   Eliezer Lofts MD   Signed by:   Christena Deem on 01/11/2008   Method used:   Print then Give to Patient   RxID:   [id number]

## 2010-11-06 NOTE — Assessment & Plan Note (Signed)
Summary: hand is swollen   Vital Signs:  Patient profile:   65 year old female Height:      62 inches Weight:      144.50 pounds BMI:     26.52 Temp:     98.5 degrees F oral Pulse rate:   84 / minute Pulse rhythm:   regular BP sitting:   110 / 70  (right arm) Cuff size:   regular  Vitals Entered By: Sherrian Divers CMA Deborra Medina) (Feb 23, 2010 3:44 PM) CC: swollen left hand   History of Present Illness: 65 yo female new to here for acute onset of left arm/ hand pain, swelling and redness.  Started yesterday afternoon, no known trauma/injury. Was outside, not sure if she was bitten by anything.  Of note, h/o breast CA and DVT in left arm. Not on any anticoagulation.      Current Medications (verified): 1)  Neurontin 300 Mg  Caps (Gabapentin) .... Take 3 Capsules By Mouth At Bedtime 2)  Crestor 20 Mg Tabs (Rosuvastatin Calcium) .[name].. 1 Tab By Mouth Daily 3)  Glucose Monitor of Choice .... Check Blood Sugar Daily Dx 250.00 4)  Test Strips of Choice .... Check Blood Sugar Daily Dx 250.00 5)  Lancets .... Check Blood Sugar Daily Dx 250.00 6)  Advair Diskus 250-50 Mcg/dose Misc (Fluticasone-Salmeterol) .[name].. 1 Puff Inh Two Times A Day 7)  Cymbalta 30 Mg Cpep (Duloxetine Hcl) .... Take 1 Capsule By Mouth Two Times A Day 8)  Fish Oil 1000 Mg Caps (Omega-3 Fatty Acids) .[name].. 1 Tab By Mouth Two Times A Day 9)  Ventolin Hfa 108 (90 Base) Mcg/act Aers (Albuterol Sulfate) .... 2 Puffs Every 4-8 Hours As Needed For Wheezing  Allergies (verified): No Known Drug Allergies  Review of Systems      See HPI General:  Denies fever. CV:  Denies chest pain or discomfort. Resp:  Denies shortness of breath. MS:  Denies loss of strength.  Physical Exam  General:  overweight appearing female in NAD, smells of smoke Lungs:  Normal respiratory effort, chest expands symmetrically. Lungs are clear to auscultation, no crackles or wheezes. Heart:  Normal rate and regular rhythm. S1 and S2 normal without  gallop, murmur, click, rub or other extra sounds. Msk:  left arm- swelling from left elbow to finger tips, TTP throughout, warm to touch. Good pulses Neurologic:  alert & oriented X3 and cranial nerves II-XII intact.   Skin:  abrasion on left hand   Impression & Recommendations:  Problem # 1:  ARM PAIN, LEFT (ICD-729.5) Assessment New Concerned for DVT given h/o malignancy and prior DVT. ? possible cellulitis given abrasion on left hand. Will send for doppler and start on Doxycycline 100 mg two times a day 10 days follow up with primary MD on Monday. Orders: Radiology Referral (Radiology)  Complete Medication List: 1)  Neurontin 300 Mg Caps (Gabapentin) .... Take 3 capsules by mouth at bedtime 2)  Crestor 20 Mg Tabs (Rosuvastatin calcium) .[name].. 1 tab by mouth daily 3)  Glucose Monitor of Choice  .... Check blood sugar daily dx 250.00 4)  Test Strips of Choice  .... Check blood sugar daily dx 250.00 5)  Lancets  .... Check blood sugar daily dx 250.00 6)  Advair Diskus 250-50 Mcg/dose Misc (Fluticasone-salmeterol) .[name].. 1 puff inh two times a day 7)  Cymbalta 30 Mg Cpep (Duloxetine hcl) .... Take 1 capsule by mouth two times a day 8)  Fish Oil 1000 Mg Caps (Omega-3 fatty  acids) .[name].. 1 tab by mouth two times a day 9)  Ventolin Hfa 108 (90 Base) Mcg/act Aers (Albuterol sulfate) .... 2 puffs every 4-8 hours as needed for wheezing  Patient Instructions: 1)  Take doxycline as directed. 2)  Please see Caren Griffins on your way out to set up your ultrasound. 3)  Please make appointment to see Dr. Diona Browner or Dr. Lorelei Pont on Monday if there is no improvement.  Current Allergies (reviewed today): No known allergies

## 2010-11-06 NOTE — Progress Notes (Signed)
Summary: wants to change to cymbalta  Phone Note Call from Patient Call back at Home Phone [phone]   Caller: Patient Call For: Eliezer Lofts MD Summary of Call: Pt wants to go back on cymbalta.  She says the welbutrin didnt help with her smoking or her depression.  Uses [facility]. Initial call taken by: Marty Heck,  January 16, 2009 12:07 PM  Follow-up for Phone Call        Left message on voicemail. Follow-up by: Christena Deem,  January 16, 2009 4:06 PM      Prescriptions: CYMBALTA 30 MG CPEP (DULOXETINE HCL) Take 1 capsule by mouth two times a day  #60 x 3   Entered and Authorized by:   Eliezer Lofts MD   Signed by:   Eliezer Lofts MD on 01/16/2009   Method used:   Electronically to        [facility] (retail)       [address]       [place], [place]  [address]       Ph: [phone]       Fax: [phone]   [id number]
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
## 2010-11-06 NOTE — Consult Note (Signed)
Summary: [facility]/Dr.Regal  [facility]/Dr.Regal   Imported By: Lanice Shirts 10/20/2007 15:00:06  _____________________________________________________________________  External Attachment:    Type:   Image     Comment:   External Document

## 2010-11-06 NOTE — Assessment & Plan Note (Signed)
Summary: 2:30 DISCUSS MEDICATION/CLE   Vital Signs:  Patient Profile:   65 Years Old Female Weight:      151.13 pounds Temp:     97.8 degrees F oral Pulse rate:   80 / minute Pulse rhythm:   regular BP sitting:   126 / 86  (left arm) Cuff size:   regular  Vitals Entered By: Christena Deem (August 19, 2007 2:32 PM)                 Chief Complaint:  Discuss medication - not feeling well.  History of Present Illness: Has been having some mild lightheadedness, dizzyness since stopping the effexor 11/9 Started wellbutirn on 11/10 tearful this AM at work   Current Allergies (reviewed today): No known allergies   Past Medical History:    Reviewed history from 08/11/2007 and no changes required:       Current Problems:        DEPRESSION, ACUTE, RECURRENT (ICD-296.33)       FOOT PAIN, BILATERAL (ICD-729.5)       HYPERGLYCEMIA (ICD-790.29)       HYPERLIPIDEMIA, MIXED (ICD-272.2)       EXAMINATION, ROUTINE MEDICAL (ICD-V70.0)       HYPERCHOLESTEROLEMIA, PURE (ICD-272.0)       CIGARETTE SMOKER (ICD-305.1)       CHOLELITHIASIS W/O CHOLECYSTITIS W/O OBST (ICD-574.20)           Family History:    Reviewed history and no changes required:    Review of Systems      See HPI   Physical Exam  General:     overweight-appearing, fatigued appearing Eyes:     No corneal or conjunctival inflammation noted. EOMI. Perrla. Funduscopic exam benign, without hemorrhages, exudates or papilledema. Vision grossly normal. No nystagmus Ears:     External ear exam shows no significant lesions or deformities.  Otoscopic examination reveals clear canals, tympanic membranes are intact bilaterally without bulging, retraction, inflammation or discharge. Hearing is grossly normal bilaterally. Nose:     External nasal examination shows no deformity or inflammation. Nasal mucosa are pink and moist without lesions or exudates. Mouth:     Oral mucosa and oropharynx without lesions or exudates.   Teeth in good repair. Lungs:     Normal respiratory effort, chest expands symmetrically. Lungs are clear to auscultation, no crackles or wheezes. Heart:     Normal rate and regular rhythm. S1 and S2 normal without gallop, murmur, click, rub or other extra sounds. Neurologic:     No cranial nerve deficits noted. Station and gait are normal.  Sensory, motor and coordinative functions appear intact. Psych:     Cognition and judgment appear intact. Alert and cooperative with normal attention span and concentration. No apparent delusions, illusions, hallucinations    Impression & Recommendations:  Problem # 1:  DEPRESSION, ACUTE, RECURRENT (ICD-296.33) lightheadedness likely secondary to new med. Pt cannot tolerate it.  Will stop wellbutrin and try Cymbalta in 1 week.  Offered referral to pshychiatrist. NO SI.  Call if not improving or if SE.  Complete Medication List: 1)  Femara 2.5 Mg Tabs (Letrozole) .... Take 1 tablet by mouth once a day 2)  Fenofibrate 160 Mg Tabs (Fenofibrate) .... Take 1 tablet by mouth once a day 3)  Lamisil 250 Mg Tabs (Terbinafine hcl) .... Take 1 tablet by mouth once a day x 6weeks 4)  Neurontin 300 Mg Caps (Gabapentin) .[name].. 1 tab by mouth at bedtime, then may increase to two times  a day after 1 week 5)  Cymbalta 30 Mg Cpep (Duloxetine hcl) .... Take 1 tablet by mouth once a day   Patient Instructions: 1)  Stop wellbutrin. 2)  In a week may start Cymbalta.    Prescriptions: CYMBALTA 30 MG  CPEP (DULOXETINE HCL) Take 1 tablet by mouth once a day  #30 x 0   Entered and Authorized by:   Eliezer Lofts MD   Signed by:   Eliezer Lofts MD on 08/19/2007   Method used:   Samples Given   RxID:   [id number]  ] Current Allergies (reviewed today): No known allergies  Current Medications (including changes made in today's visit):  FEMARA 2.5 MG TABS (LETROZOLE) Take 1 tablet by mouth once a day FENOFIBRATE 160 MG  TABS (FENOFIBRATE) Take 1 tablet by mouth once a  day LAMISIL 250 MG  TABS (TERBINAFINE HCL) Take 1 tablet by mouth once a day x 6weeks NEURONTIN 300 MG  CAPS (GABAPENTIN) 1 tab by mouth at bedtime, then may increase to two times a day after 1 week CYMBALTA 30 MG  CPEP (DULOXETINE HCL) Take 1 tablet by mouth once a day   Appended Document: Orders Update    Clinical Lists Changes  Orders: Added new Service order of Est. Patient Level III [id number]) - Signed

## 2010-11-06 NOTE — Progress Notes (Signed)
Summary: Pt returned nurse call....  Phone Note Call from Patient   Caller: Patient Call For: [phone] Summary of Call: Pt returned nurse call.  Would like to discuss diag mammo.Marland KitchenMarland KitchenPlease call her back.Allen Norris  March 29, 2010 12:57 PM Initial call taken by: Allen Norris,  March 29, 2010 12:57 PM  Follow-up for Phone Call        Patient wants results of CT scan if possible would not like to wait until monday Follow-up by: Zenda Alpers CMA Deborra Medina),  March 29, 2010 1:06 PM  Additional Follow-up for Phone Call Additional follow up Details #1::        diag mammo normal CT chest stable with 2 small nodules appearing identical -- f/u CT recommended in 12-18 months. Reassuring and not alarming exam at all. Additional Follow-up by: Owens Loffler MD,  March 29, 2010 1:16 PM    Additional Follow-up for Phone Call Additional follow up Details #2::    Lmom for patient to return my call Follow-up by: Zenda Alpers CMA Deborra Medina),  March 29, 2010 2:06 PM  Additional Follow-up for Phone Call Additional follow up Details #3:: Details for Additional Follow-up Action Taken: Patient advised of results.Cathlean Cower CMA   Additional Follow-up by: Zenda Alpers CMA Deborra Medina),  March 29, 2010 2:13 PM

## 2010-11-06 NOTE — Progress Notes (Signed)
Summary: ? on scan  Phone Note Call from Patient Call back at Home Phone [phone]   Caller: Patient Call For: Eliezer Lofts MD Reason for Call: Lab or Test Results Summary of Call: Pt states that the tech told her she felt a fatty tumor on her collar bone and would like to know if its true or not. Initial call taken by: Jasmine December CMA,  September 14, 2009 10:45 AM  Follow-up for Phone Call        Per tech at heart test..mass noted...cannot say what it is.  Amke appt for exam.  Follow-up by: Eliezer Lofts MD,  September 14, 2009 11:20 AM  Additional Follow-up for Phone Call Additional follow up Details #1::        Patient advised.Cathlean Cower CMA  Additional Follow-up by: Zenda Alpers CMA Deborra Medina),  September 14, 2009 12:43 PM

## 2010-11-06 NOTE — Consult Note (Signed)
Summary: [facility]/Consultation Report/Dr. Bary Castilla   [facility]Dr. Bary Castilla   Imported By: Genice Rouge 12/02/2007 10:29:59  _____________________________________________________________________  External Attachment:    Type:   Image     Comment:   External Document

## 2010-11-06 NOTE — Progress Notes (Signed)
Summary: wants results  Phone Note Call from Patient Call back at Home Phone [phone]   Caller: Patient Call For: Eliezer Lofts MD Summary of Call: Patient called requesting results from her ct scan and lab work.  Initial call taken by: Lacretia Nicks,  September 27, 2009 11:40 AM  Follow-up for Phone Call        Called patient. Reviewed CT scan results with her. Small pulmonary nodule - f/u 6-12 mo indicated.  No abnormality around the area in question noted and no uptake of contrast.  cc: Dr. Diona Browner Follow-up by: Owens Loffler MD,  September 27, 2009 1:57 PM     Appended Document: wants results See addendum to Ct result.

## 2010-11-06 NOTE — Progress Notes (Signed)
Summary: refill med  Phone Note Call from Patient Call back at Home Phone [phone]   Caller: Patient Call For: bedsole Summary of Call: pt recieved a message she wants refill effexor xr 150, 75mg  until can be seen nest month. Initial call taken by: Daralene Milch,  July 14, 2007 1:12 PM  Follow-up for Phone Call        Please call in below prescription to pharmacy.  Follow-up by: Eliezer Lofts MD,  July 14, 2007 1:51 PM  Additional Follow-up for Phone Call Additional follow up Details #1::        rx sent in to [facility] ..................................................................Marland KitchenRonny Bacon Chavers  July 14, 2007 2:26 PM       Prescriptions: EFFEXOR XR 75 MG CP24 (VENLAFAXINE HCL) Take 1 capsule by mouth once a day  #30 x 0   Entered by:   Daralene Milch   Authorized by:   Eliezer Lofts MD   Signed by:   Daralene Milch on 07/14/2007   Method used:   Electronically sent to ...       [facility]       [place], [place]  [address]       Ph: [phone] or [phone]       Fax: [phone]   RxID:   [id number] EFFEXOR XR 150 MG CP24 (VENLAFAXINE HCL) Take 1 capsule by mouth once a day  #30 x 0   Entered by:   Daralene Milch   Authorized by:   Eliezer Lofts MD   Signed by:   Daralene Milch on 07/14/2007   Method used:   Electronically sent to ...       [facility]       [place], [place]  [address]       Ph: [phone] or [phone]       Fax: [phone]   RxID:   [id number] EFFEXOR XR 150 MG CP24 (VENLAFAXINE HCL) Take 1 capsule by mouth once a day  #0 x 0   Entered and Authorized by:   Eliezer Lofts MD   Signed by:   Christena Deem on 07/14/2007   Method used:   Telephoned to ...         [id number]:[id number]

## 2010-11-06 NOTE — Progress Notes (Signed)
Summary: regarding tick bite  Phone Note Call from Patient   Caller: Patient Summary of Call: Pt came in to office yesterday.  She found a tick under her arm on saturday.  Area itches, is red and irritated looking.  Pt has been scratching.  She feels fine otherwise.  I advised pt to try some benedryl and see if that helps.  I called her back today, she did try the benedryl, it did help and she is better today.  She will call if any further problems. Initial call taken by: Marty Heck CMA,  January 18, 2010 3:55 PM

## 2010-11-06 NOTE — Assessment & Plan Note (Signed)
Summary: CPX/DLO   Vital Signs:  Patient profile:   65 year old female Height:      62 inches Weight:      140 pounds BMI:     25.70 Temp:     98.1 degrees F oral Pulse rate:   76 / minute Pulse rhythm:   regular BP sitting:   110 / 80  (left arm) Cuff size:   regular  Vitals Entered By: Zenda Alpers CMA Deborra Medina) (Mar 02, 2010 3:19 PM) CC: 30 minute exam   History of Present Illness:  The patient is here for annual wellness exam and preventative care.    Also with the following acute and chronic issues.   Left hand swelling..no evidence of DVT on dopplers. On doxycyline x 10 day course...with almost total improvement.  COPD, over useing albuterol. Continue Advair. Did not tolerate Spiriva. Open to smoking cessation  Hypertension History:      Positive major cardiovascular risk factors include female age 33 years old or older, diabetes, hyperlipidemia, hypertension, and current tobacco user.  Negative major cardiovascular risk factors include negative family history for ischemic heart disease.        Further assessment for target organ damage reveals no history of ASHD, stroke/TIA, or peripheral vascular disease.     Problems Prior to Update: 1)  Arm Pain, Left  (ICD-729.5) 2)  Pulmonary Nodule  (ICD-518.89) 3)  Neoplasms Unspec Nature Bone Soft Tissue&skin  (ICD-239.2) 4)  Abnormal Electrocardiogram  (ICD-794.31) 5)  Hypertension, Benign Essential  (ICD-401.1) 6)  Chest Pain, Atypical  (ICD-786.59) 7)  Hx of Adenocarcinoma, Breast  (ICD-174.9) 8)  Routine Gynecological Examination  (ICD-V72.31) 9)  Well Woman  (ICD-V70.0) 10)  Disturbance of Skin Sensation  (ICD-782.0) 11)  Special Screening For Osteoporosis  (ICD-V82.81) 12)  Constipation  (ICD-564.00) 13)  Anemia, Normocytic  (ICD-285.9) 14)  Flank Pain  (ICD-789.09) 15)  Neck Pain, Left  (ICD-723.1) 16)  Copd, Mild  (ICD-496) 17)  Depression, Acute, Recurrent  (ICD-296.33) 18)  Foot Pain, Bilateral   (ICD-729.5) 19)  Diabetes Mellitus, Type II  (ICD-250.00) 20)  Hyperlipidemia, Mixed  (ICD-272.2) 21)  Examination, Routine Medical  (ICD-V70.0) 22)  Cigarette Smoker  (ICD-305.1) 23)  Cholelithiasis w/o Cholecystitis w/o Obst  (ICD-574.20)  Current Medications (verified): 1)  Neurontin 300 Mg  Caps (Gabapentin) .... Take 3 Capsules By Mouth At Bedtime 2)  Crestor 20 Mg Tabs (Rosuvastatin Calcium) .[name].. 1 Tab By Mouth Daily 3)  Glucose Monitor of Choice .... Check Blood Sugar Daily Dx 250.00 4)  Test Strips of Choice .... Check Blood Sugar Daily Dx 250.00 5)  Lancets .... Check Blood Sugar Daily Dx 250.00 6)  Advair Diskus 250-50 Mcg/dose Misc (Fluticasone-Salmeterol) .[name].. 1 Puff Inh Two Times A Day 7)  Cymbalta 30 Mg Cpep (Duloxetine Hcl) .... Take 1 Capsule By Mouth Two Times A Day 8)  Fish Oil 1000 Mg Caps (Omega-3 Fatty Acids) .[name].. 1 Tab By Mouth Two Times A Day 9)  Proair Hfa 108 (90 Base) Mcg/act Aers (Albuterol Sulfate) .... 2 Puffs Every 4-6 Hours As Needed For Wheezing For Rescue 10)  Atrovent Hfa 17 Mcg/act Aers (Ipratropium Bromide Hfa) .... 2 Puff Inhaled Every 6 Hours 11)  Bupropion Hcl 150 Mg Xr12h-Tab (Bupropion Hcl) .[name].. 1 Tab By Mouth Two Times A Day X 7-12 Weeks  Allergies (verified): No Known Drug Allergies  Past History:  Past medical, surgical, family and social histories (including risk factors) reviewed, and no changes noted (except as noted  below).  Past Medical History: Reviewed history from 04/06/2008 and no changes required. Current Problems:  COPD, MILD (ICD-496) DEPRESSION, ACUTE, RECURRENT (ICD-296.33) FOOT PAIN, BILATERAL (ICD-729.5) HYPERGLYCEMIA (ICD-790.29) HYPERLIPIDEMIA, MIXED (ICD-272.2) EXAMINATION, ROUTINE MEDICAL (ICD-V70.0) HYPERCHOLESTEROLEMIA, PURE (ICD-272.0) CIGARETTE SMOKER (ICD-305.1) CHOLELITHIASIS W/O CHOLECYSTITIS W/O OBST (ICD-574.20)    Past Surgical History: Reviewed history from 07/09/2007 and no changes required. 2005  lumpectomy  Family History: Reviewed history and no changes required.  Social History: Reviewed history from 04/06/2008 and no changes required. Current Smoker  Review of Systems General:  Denies fatigue and fever. CV:  Denies chest pain or discomfort. Resp:  Denies shortness of breath. GI:  Denies abdominal pain and bloody stools. GU:  Denies abnormal vaginal bleeding and dysuria. Psych:  Denies anxiety and depression.  Physical Exam  General:  overweight appearing female in NAD, smells of smoke Ears:  External ear exam shows no significant lesions or deformities.  Otoscopic examination reveals clear canals, tympanic membranes are intact bilaterally without bulging, retraction, inflammation or discharge. Hearing is grossly normal bilaterally. Nose:  External nasal examination shows no deformity or inflammation. Nasal mucosa are pink and moist without lesions or exudates. Mouth:  Oral mucosa and oropharynx without lesions or exudates.  Poor dentition, MMM Neck:  no carotid bruit or thyromegaly .nodes Chest Wall:  No deformities, masses, or tenderness noted. Breasts:  No mass, nodules, thickening, tenderness, bulging, retraction, inflamation, nipple discharge or skin changes noted.   Lungs:  Normal respiratory effort, chest expands symmetrically. Lungs are clear to auscultation, no crackles or wheezes. Heart:  Normal rate and regular rhythm. S1 and S2 normal without gallop, murmur, click, rub or other extra sounds. Abdomen:  Bowel sounds positive,abdomen soft and non-tender without masses, organomegaly or hernias noted. Genitalia:  refused  Msk:  No deformity or scoliosis noted of thoracic or lumbar spine.   Pulses:  R and L carotid,radial,femoral,dorsalis pedis and posterior tibial pulses are full and equal bilaterally Extremities:  No clubbing, cyanosis, edema, or deformity noted with normal full range of motion of all joints.    Diabetes Management Exam:    Foot Exam (with socks  and/or shoes not present):       Sensory-Pinprick/Light touch:          Left medial foot (L-4): normal          Left dorsal foot (L-5): normal          Left lateral foot (S-1): normal          Right medial foot (L-4): normal          Right dorsal foot (L-5): normal          Right lateral foot (S-1): normal       Sensory-Monofilament:          Left foot: normal          Right foot: normal       Inspection:          Left foot: normal          Right foot: normal       Nails:          Left foot: normal          Right foot: normal   Impression & Recommendations:  Problem # 1:  WELL WOMAN (ICD-V70.0) Assessment Improved The patient's preventative maintenance and recommended screening tests for an annual wellness exam were reviewed in full today. Brought up to date unless services declined.  Counselled on the importance of  diet, exercise, and its role in overall health and mortality. The patient's FH and SH was reviewed, including their home life, tobacco status, and drug and alcohol status.     Problem # 2:  HYPERTENSION, BENIGN ESSENTIAL (ICD-401.1)  Well controlled. Continue current medication.   BP today: 110/80 Prior BP: 110/70 (02/23/2010)  Prior 10 Yr Risk Heart Disease: 24 % (12/08/2009)  Labs Reviewed: K+: 4.0 (02/21/2010) Creat: : 0.8 (02/21/2010)   Chol: 131 (02/21/2010)   HDL: 36.40 (02/21/2010)   LDL: 70 (05/26/2009)   TG: 236.0 (02/21/2010)  Problem # 3:  COPD, MILD (ICD-496) Stable control.  Her updated medication list for this problem includes:    Advair Diskus 250-50 Mcg/dose Misc (Fluticasone-salmeterol) .[name]... 1 puff inh two times a day    Proair Hfa 108 (90 Base) Mcg/act Aers (Albuterol sulfate) .[name]... 2 puffs every 4-6 hours as needed for wheezing for rescue    Atrovent Hfa 17 Mcg/act Aers (Ipratropium bromide hfa) .[name]... 2 puff inhaled every 6 hours  Problem # 4:  DIABETES MELLITUS, TYPE II (ICD-250.00) Well controlled. Continue current medication.    Labs Reviewed: Creat: 0.8 (02/21/2010)    Reviewed HgBA1c results: 6.4 (02/21/2010)  7.2 (12/04/2009)  Problem # 5:  DEPRESSION, ACUTE, RECURRENT (ICD-296.33) stable on current medication.   Complete Medication List: 1)  Neurontin 300 Mg Caps (Gabapentin) .... Take 3 capsules by mouth at bedtime 2)  Crestor 20 Mg Tabs (Rosuvastatin calcium) .[name].. 1 tab by mouth daily 3)  Glucose Monitor of Choice  .... Check blood sugar daily dx 250.00 4)  Test Strips of Choice  .... Check blood sugar daily dx 250.00 5)  Lancets  .... Check blood sugar daily dx 250.00 6)  Advair Diskus 250-50 Mcg/dose Misc (Fluticasone-salmeterol) .[name].. 1 puff inh two times a day 7)  Cymbalta 30 Mg Cpep (Duloxetine hcl) .... Take 1 capsule by mouth two times a day 8)  Fish Oil 1000 Mg Caps (Omega-3 fatty acids) .[name].. 1 tab by mouth two times a day 9)  Proair Hfa 108 (90 Base) Mcg/act Aers (Albuterol sulfate) .... 2 puffs every 4-6 hours as needed for wheezing for rescue 10)  Atrovent Hfa 17 Mcg/act Aers (Ipratropium bromide hfa) .... 2 puff inhaled every 6 hours 11)  Bupropion Hcl 150 Mg Xr12h-tab (Bupropion hcl) .[name].. 1 tab by mouth two times a day x 7-12 weeks  Other Orders: Radiology Referral (Radiology)  Hypertension Assessment/Plan:      The patient's hypertensive risk group is category C: Target organ damage and/or diabetes.  Her calculated 10 year risk of coronary heart disease is 24 %.  Today's blood pressure is 110/80.  Her blood pressure goal is < 130/80.  Patient Instructions: 1)  Referral Appointment Information 2)  Day/Date: 3)  Time: 4)  Place/MD: 5)  Address: 6)  Phone/Fax: 7)  Patient given appointment information. Information/Orders faxed/mailed.  8)  Call insurance about shingles vaccine coverage...make nurse visit to get if interested. 9)  Increase ALA and DHA Fish oil to 4000 mg divided daily.  10)  Follow up appt in 3 months DM.  11)  Start bupropion for smoking cessation and mood. 12)    Limit albuterol use..change to atrovent and save albutrol for rescue.  13)  BMP prior to visit, ICD-9: 250.00 Dx 14)  Hepatic Panel prior to visit ICD-9:  15)  Lipid panel prior to visit ICD-9 :  16)  HgBA1c prior to visit  ICD-9:  Prescriptions: BUPROPION HCL 150 MG XR12H-TAB (BUPROPION HCL)  1 tab by mouth two times a day x 7-12 weeks  #60 x 3   Entered and Authorized by:   Eliezer Lofts MD   Signed by:   Eliezer Lofts MD on 03/02/2010   Method used:   Electronically to        [facility] (retail)       [address]       [place], [place]  [address]       Ph: [phone]       Fax: [phone]   [id number] ATROVENT HFA 17 MCG/ACT AERS (IPRATROPIUM BROMIDE HFA) 2 puff inhaled every 6 hours  #1 x 11   Entered and Authorized by:   Eliezer Lofts MD   Signed by:   Eliezer Lofts MD on 03/02/2010   Method used:   Electronically to        [facility] (retail)       [address]       [place], [place]  [address]       Ph: [phone]       Fax: [phone]   [id number]   Current Allergies (reviewed today): No known allergies  Hemoccult Next Due:  Not Indicated Last PAP:  NEGATIVE FOR INTRAEPITHELIAL LESIONS OR MALIGNANCY. (02/28/2009 12:00:00 AM) PAP Next Due:  Refused DX last year nml

## 2010-11-06 NOTE — Progress Notes (Signed)
Summary: Nuc Pre-Procedure  Phone Note Outgoing Call Call back at Home Phone [phone]   Call placed by: Vedia Pereyra, CNMT Call placed to: Patient Reason for Call: Confirm/change Appt Summary of Call: Reviewed information on [facility] Information Sheet (see scanned document for further details).  Spoke with patient.     Nuclear Med Background Indications for Stress Test: Evaluation for Ischemia, Abnormal EKG   History: COPD      Nuclear Pre-Procedure Cardiac Risk Factors: Hypertension, NIDDM, Smoker Height (in): 62

## 2010-11-06 NOTE — Progress Notes (Signed)
Summary: Cymbalta  Phone Note Refill Request Message from:  Fax from Pharmacy on January 10, 2009 4:43 PM  Refills Requested: Medication #1:  CYMBALTA 30 MG CPEP Take 1 capsule by mouth two times a day. [facility]  Phone:   [phone]    This medication was not originally on the med list.  It was added in response to this refill request.   Method Requested: Electronic Initial call taken by: Christena Deem,  January 10, 2009 4:45 PM  Follow-up for Phone Call        denied.[name] on wellbutrin...no longer on cymbalta. Follow-up by: Eliezer Lofts MD,  January 10, 2009 5:05 PM  Additional Follow-up for Phone Call Additional follow up Details #1::        Faxed denial. Additional Follow-up by: Christena Deem,  January 10, 2009 5:26 PM

## 2010-11-06 NOTE — Assessment & Plan Note (Signed)
Summary: GALLSTONES  Medications Added EFFEXOR XR 150 MG CP24 (VENLAFAXINE HCL) Take 1 capsule by mouth once a day FEMARA 2.5 MG TABS (LETROZOLE) Take 1 tablet by mouth once a day EFFEXOR XR 75 MG CP24 (VENLAFAXINE HCL) Take 1 capsule by mouth once a day      Allergies Added: NKDA  Vital Signs:  Patient Profile:   64 Years Old Female Weight:      154 pounds Temp:     98.5 degrees F oral Pulse rate:   103 / minute BP sitting:   164 / 88  (right arm) Cuff size:   regular  Vitals Entered By: Glenna Durand (April 22, 2007 3:59 PM)               Chief Complaint:  gallstones and ?med to break them up.  History of Present Illness: Here to discuss meds to get rid of gall stones.  Has been diagnosed with stones since 1988, controlling with diet.  Discussed briefly with Dr Bary Castilla in 2005 when being treated for breast ca.  No plans then made for removal.  Not sure why flairing now.  Pain under R lower ribs, sharpe, goes away quickly.  Some nausea, no vomiting, or fever/chills.  Also wants to talk about stopping smoking.  Tried Chantix some months ago.  1mg  tabs made nauseated and not feel right.  Sopped in  ~ 2-3 wks of starting.  Wants to try again.  Has some pills from before.  Current Allergies: No known allergies      Review of Systems      See HPI   Physical Exam  General:     alert, well-developed, well-nourished, well-hydrated, and overweight-appearing.  NAD Head:     normocephalic.   Eyes:     no injection.   Lungs:     normal respiratory effort.   Abdomen:     obese, soft, normal bowel sounds, no masses, no abdominal hernia, no inguinal hernia, no hepatomegaly, and no splenomegaly.  tender mid epigastric and RUQ. Neurologic:     alert & oriented X3, sensation intact to light touch, and gait normal.   Skin:     turgor normal and color normal.   Psych:     normally interactive.      Impression & Recommendations:  Problem # 1:  CHOLELITHIASIS W/O  CHOLECYSTITIS W/O OBST (ICD-574.20) fat free diet  Dr Bary Castilla if increased pain/N/V  Problem # 2:  CIGARETTE SMOKER (ICD-305.1) reviewed use of Chantix   Medications Added to Medication List This Visit: 1)  Effexor Xr 150 Mg Cp24 (Venlafaxine hcl) .... Take 1 capsule by mouth once a day 2)  Femara 2.5 Mg Tabs (Letrozole) .... Take 1 tablet by mouth once a day 3)  Effexor Xr 75 Mg Cp24 (Venlafaxine hcl) .... Take 1 capsule by mouth once a day   Patient Instructions: 1)  schedule fasting labs:  [place] panel---v70.0 2)                                        lipids--272.0

## 2010-11-06 NOTE — Assessment & Plan Note (Signed)
Summary: 30 MIN 54MONTH FOLLOW UP/RBH   Vital Signs:  Patient profile:   65 year old female Height:      62 inches Weight:      146.6 pounds BMI:     26.91 Temp:     98.4 degrees F oral Pulse rate:   96 / minute Pulse rhythm:   regular BP sitting:   120 / 78  (left arm) Cuff size:   regular  Vitals Entered By: Zenda Alpers CMA Deborra Medina) (December 08, 2009 3:38 PM)  History of Present Illness: Chief complaint 3 month follow up  No further chest wall swelling or chesst pain. Stable SOB, chronic.   DM , improved control, but remains above 7. Limited exercise. No weight loss.  Some diet changes.  Still drinks soda.  Now eats whole.   Has been out of crestor for 1 week  now poorly controlled.   Hypertension History:      She denies chest pain, palpitations, dyspnea with exertion, orthopnea, peripheral edema, and side effects from treatment.  She notes no problems with any antihypertensive medication side effects.  Well controlled at home .        Positive major cardiovascular risk factors include female age 49 years old or older, diabetes, hyperlipidemia, hypertension, and current tobacco user.  Negative major cardiovascular risk factors include negative family history for ischemic heart disease.        Further assessment for target organ damage reveals no history of ASHD, stroke/TIA, or peripheral vascular disease.     Problems Prior to Update: 1)  Pulmonary Nodule  (ICD-518.89) 2)  Neoplasms Unspec Nature Bone Soft Tissue&skin  (ICD-239.2) 3)  Abnormal Electrocardiogram  (ICD-794.31) 4)  Hypertension, Benign Essential  (ICD-401.1) 5)  Chest Pain, Atypical  (ICD-786.59) 6)  Hx of Adenocarcinoma, Breast  (ICD-174.9) 7)  Routine Gynecological Examination  (ICD-V72.31) 8)  Well Woman  (ICD-V70.0) 9)  Disturbance of Skin Sensation  (ICD-782.0) 10)  Special Screening For Osteoporosis  (ICD-V82.81) 11)  Constipation  (ICD-564.00) 12)  Anemia, Normocytic  (ICD-285.9) 13)  Flank Pain   (ICD-789.09) 14)  Neck Pain, Left  (ICD-723.1) 15)  Copd, Mild  (ICD-496) 16)  Depression, Acute, Recurrent  (ICD-296.33) 17)  Foot Pain, Bilateral  (ICD-729.5) 18)  Diabetes Mellitus, Type II  (ICD-250.00) 19)  Hyperlipidemia, Mixed  (ICD-272.2) 20)  Examination, Routine Medical  (ICD-V70.0) 21)  Hypercholesterolemia, Pure  (ICD-272.0) 22)  Cigarette Smoker  (ICD-305.1) 23)  Cholelithiasis w/o Cholecystitis w/o Obst  (ICD-574.20)  Current Medications (verified): 1)  Neurontin 300 Mg  Caps (Gabapentin) .... Take 3 Capsules By Mouth At Bedtime 2)  Crestor 20 Mg Tabs (Rosuvastatin Calcium) .[name].. 1 Tab By Mouth Daily 3)  Glucose Monitor of Choice .... Check Blood Sugar Daily Dx 250.00 4)  Test Strips of Choice .... Check Blood Sugar Daily Dx 250.00 5)  Lancets .... Check Blood Sugar Daily Dx 250.00 6)  Advair Diskus 250-50 Mcg/dose Misc (Fluticasone-Salmeterol) .[name].. 1 Puff Inh Two Times A Day 7)  Cymbalta 30 Mg Cpep (Duloxetine Hcl) .... Take 1 Capsule By Mouth Two Times A Day 8)  Fish Oil 1000 Mg Caps (Omega-3 Fatty Acids) .[name].. 1 Tab By Mouth Two Times A Day  Allergies (verified): No Known Drug Allergies  Past History:  Past medical, surgical, family and social histories (including risk factors) reviewed, and no changes noted (except as noted below).  Past Medical History: Reviewed history from 04/06/2008 and no changes required. Current Problems:  COPD, MILD (ICD-496)  DEPRESSION, ACUTE, RECURRENT (ICD-296.33) FOOT PAIN, BILATERAL (ICD-729.5) HYPERGLYCEMIA (ICD-790.29) HYPERLIPIDEMIA, MIXED (ICD-272.2) EXAMINATION, ROUTINE MEDICAL (ICD-V70.0) HYPERCHOLESTEROLEMIA, PURE (ICD-272.0) CIGARETTE SMOKER (ICD-305.1) CHOLELITHIASIS W/O CHOLECYSTITIS W/O OBST (ICD-574.20)    Past Surgical History: Reviewed history from 07/09/2007 and no changes required. 2005 lumpectomy  Family History: Reviewed history and no changes required.  Social History: Reviewed history from 04/06/2008  and no changes required. Current Smoker  Review of Systems General:  Complains of fatigue. CV:  Denies chest pain or discomfort. Resp:  Denies sputum productive and wheezing; stable SOB. GI:  Denies abdominal pain. GU:  Denies dysuria.  Physical Exam  General:  overweight appearing female in NAD, smells of smoke Ears:  External ear exam shows no significant lesions or deformities.  Otoscopic examination reveals clear canals, tympanic membranes are intact bilaterally without bulging, retraction, inflammation or discharge. Hearing is grossly normal bilaterally. Nose:  External nasal examination shows no deformity or inflammation. Nasal mucosa are pink and moist without lesions or exudates. Mouth:  Oral mucosa and oropharynx without lesions or exudates.  Poor dentition, MMM Neck:  no carotid bruit or thyromegaly .nodes Lungs:  Normal respiratory effort, chest expands symmetrically. Lungs are clear to auscultation, no crackles or wheezes. Heart:  Normal rate and regular rhythm. S1 and S2 normal without gallop, murmur, click, rub or other extra sounds. Abdomen:  Bowel sounds positive,abdomen soft and non-tender without masses, organomegaly or hernias noted. Pulses:  R and L carotid,radial,femoral,dorsalis pedis and posterior tibial pulses are full and equal bilaterally Extremities:  No clubbing, cyanosis, edema, or deformity noted with normal full range of motion of all joints.    Diabetes Management Exam:    Foot Exam (with socks and/or shoes not present):       Sensory-Pinprick/Light touch:          Left medial foot (L-4): normal          Left dorsal foot (L-5): normal          Left lateral foot (S-1): normal          Right medial foot (L-4): normal          Right dorsal foot (L-5): normal          Right lateral foot (S-1): normal       Sensory-Monofilament:          Left foot: normal          Right foot: normal       Inspection:          Left foot: normal          Right foot:  normal       Nails:          Left foot: normal          Right foot: normal   Impression & Recommendations:  Problem # 1:  HYPERTENSION, BENIGN ESSENTIAL (ICD-401.1) Well controlled. Continue current medication.  BP today: 120/78 Prior BP: 130/70 (09/19/2009)  10 Yr Risk Heart Disease: 24 % Prior 10 Yr Risk Heart Disease: > 32 % (09/05/2009)  Labs Reviewed: K+: 4.3 (12/04/2009) Creat: : 0.7 (12/04/2009)   Chol: 255 (12/04/2009)   HDL: 42.20 (12/04/2009)   LDL: 70 (05/26/2009)   TG: 480.0 (12/04/2009)  Problem # 2:  CHEST PAIN, ATYPICAL (ICD-786.59) Work up negative except Chest Ct showed ?pulm nodule. Pain resolved.  Given smoker..follow up Chest CT for stability of nodule in 6 months. Encouraged smoking cessation.   Problem # 3:  DIABETES MELLITUS, TYPE II (  ICD-250.00)  Improvingl control. Continue current medication. Increase weight loss, diet changes and exercise.   Labs Reviewed: Creat: 0.7 (12/04/2009)    Reviewed HgBA1c results: 7.2 (12/04/2009)  7.9 (08/28/2009)  Problem # 4:  HYPERLIPIDEMIA, MIXED (ICD-272.2)  Restart crestor..recheck in 3 months.  Her updated medication list for this problem includes:    Crestor 20 Mg Tabs (Rosuvastatin calcium) .[name]... 1 tab by mouth daily  Labs Reviewed: SGOT: 29 (12/04/2009)   SGPT: 33 (12/04/2009)  Lipid Goals: Chol Goal: 200 (07/09/2007)   HDL Goal: 40 (07/09/2007)   LDL Goal: 100 (02/28/2009)   TG Goal: 150 (07/09/2007)  10 Yr Risk Heart Disease: 24 % Prior 10 Yr Risk Heart Disease: > 32 % (09/05/2009)   HDL:42.20 (12/04/2009), 35.00 (05/26/2009)  LDL:70 (05/26/2009), DEL (12/15/2008)  Chol:255 (12/04/2009), 136 (05/26/2009)  Trig:480.0 (12/04/2009), 277.0 (05/26/2009)  Complete Medication List: 1)  Neurontin 300 Mg Caps (Gabapentin) .... Take 3 capsules by mouth at bedtime 2)  Crestor 20 Mg Tabs (Rosuvastatin calcium) .[name].. 1 tab by mouth daily 3)  Glucose Monitor of Choice  .... Check blood sugar daily dx 250.00 4)   Test Strips of Choice  .... Check blood sugar daily dx 250.00 5)  Lancets  .... Check blood sugar daily dx 250.00 6)  Advair Diskus 250-50 Mcg/dose Misc (Fluticasone-salmeterol) .[name].. 1 puff inh two times a day 7)  Cymbalta 30 Mg Cpep (Duloxetine hcl) .... Take 1 capsule by mouth two times a day 8)  Fish Oil 1000 Mg Caps (Omega-3 fatty acids) .[name].. 1 tab by mouth two times a day  Hypertension Assessment/Plan:      The patient's hypertensive risk group is category C: Target organ damage and/or diabetes.  Her calculated 10 year risk of coronary heart disease is 24 %.  Today's blood pressure is 120/78.  Her blood pressure goal is < 130/80.  Patient Instructions: 1)  HgBA1c prior to visit  ICD-9: 250.0 ALL 2)  Please schedule a follow-up appointment in 3 months CPX.  3)  BMP prior to visit, ICD-9:  4)  Hepatic Panel prior to visit ICD-9:  5)  Lipid panel prior to visit ICD-9 :  6)  Urine Microalbumin prior to visit ICD-9 :  Prescriptions: CRESTOR 20 MG TABS (ROSUVASTATIN CALCIUM) 1 tab by mouth daily  #90 x 3   Entered and Authorized by:   Eliezer Lofts MD   Signed by:   Eliezer Lofts MD on 12/08/2009   Method used:   Electronically to        [facility] (retail)       [address]       [place], [place]  [address]       Ph: [phone]       Fax: [phone]   [id number]   Current Allergies (reviewed today): No known allergies

## 2010-11-06 NOTE — Progress Notes (Signed)
Summary: buproprian  Phone Note Call from Patient   Caller: Pharmacy Call For: Eliezer Lofts MD Summary of Call: Pharmacy called and said that the Buproprian was sent over as xr and it comes in xl or sr. They wanted to know if you meant sr.  Pleas advise. Call back # [phone]. Initial call taken by: Lacretia Nicks,  Mar 02, 2010 4:03 PM  Follow-up for Phone Call        Yes mean SR.Marland Kitchenalso ask pharm if she is due for any med refills..send in any as appropriate.  Follow-up by: Eliezer Lofts MD,  Mar 02, 2010 4:05 PM  Additional Follow-up for Phone Call Additional follow up Details #1::        Phone Call Completed Additional Follow-up by: Zenda Alpers CMA Deborra Medina),  Mar 02, 2010 4:15 PM

## 2010-11-06 NOTE — Progress Notes (Signed)
Summary: fyi(bedsole pt)  Phone Note Call from Patient Call back at Home Phone [phone]   Caller: Patient Call For: bedsole Summary of Call: lipitor rx, pt says she was on [facility]itor in [facility], she says she just had labs done and saw you recently. do you want to start her on lipitor? pt uses [facility] Initial call taken by: Daralene Milch,  May 05, 2007 10:16 AM  Follow-up for Phone Call        office visit 04/30/07---given handouts re low cholesterol diet, and low triglycerides.  No meds started as wanted to get triglycerides down and keep LDL at current level or lower.  Labs fasting in 2 mo--should have appt for these labs, if not please make.  Fasting lipids---272.2 ------------------------- noted, her response Follow-up by: Dixie Dials FNP,  May 05, 2007 10:34 AM  Additional Follow-up for Phone Call Additional follow up Details #1::        I advised pt this she was not happy with it she says she will contact [facility] b/c they originally prescribed med. ..................................................................Marland KitchenDaralene Milch  May 05, 2007 2:04 PM

## 2010-11-06 NOTE — Letter (Signed)
Summary: Generic Letter  [place] at [facility]  [address] [place], [place] [address]   Phone: [phone]  Fax: [phone]    02/27/2010  [place], [place]  [address]  Dear Ms. Theodoro Clock,   We have tried to contact you by telephone and was unable to reach you.  Please call our office and update your telephone number at your convenience.  Dr. Deborra Medina wanted you to know that your doppler ultrasound was normal (no blood clot).  Please call our office if you have any questions.       Sincerely,       Arnette Norris, MD

## 2010-11-06 NOTE — Assessment & Plan Note (Signed)
Summary: FU /MK   Vital Signs:  Patient Profile:   65 Years Old Female Weight:      151.25 pounds Temp:     97.9 degrees F oral Pulse rate:   80 / minute Pulse rhythm:   regular BP sitting:   122 / 72  (left arm) Cuff size:   regular  Vitals Entered By: Christena Deem (November 10, 2008 12:49 PM)                 Chief Complaint:  FU.  History of Present Illness: Has been having some left rib cage pain intermittant in certain positions for 1 year. Had lumpectomy left breast 2005  DM, well controlled but never completed classes. Planning on starting exercsie at gym.  Still smoking, Chantix made her irritable. Tried spriva wthout much improvement for COPD.  Depression, moderate control. No longer on cymbalta because of SE. Interested in trying something different for depression as well as for smoking cessation such as wellbutrin. See phone note from grandaughter describing sleeping all day, mood worsening etc.    Diabetes Management History:      No sensory loss is reported.  Self foot exams are not being performed.  She is checking home blood sugars.  She says that she is not exercising regularly.        Hypoglycemic symptoms are not occurring.  No hyperglycemic symptoms are reported.        There are no symptoms to suggest diabetic complications.  Other questions/concerns include: only rarely takes blood sugar, did have low of 63 once in last few months.  The following changes have been made to her treatment plan since last visit: exercise program.       Current Allergies (reviewed today): No known allergies   Past Medical History:    Reviewed history from 04/06/2008 and no changes required:       Current Problems:        COPD, MILD (ICD-496)       DEPRESSION, ACUTE, RECURRENT (ICD-296.33)       FOOT PAIN, BILATERAL (ICD-729.5)       HYPERGLYCEMIA (ICD-790.29)       HYPERLIPIDEMIA, MIXED (ICD-272.2)       EXAMINATION, ROUTINE MEDICAL (ICD-V70.0)  HYPERCHOLESTEROLEMIA, PURE (ICD-272.0)       CIGARETTE SMOKER (ICD-305.1)       CHOLELITHIASIS W/O CHOLECYSTITIS W/O OBST (ICD-574.20)           Social History:    Reviewed history from 04/06/2008 and no changes required:       Current Smoker   Risk Factors:  Exercise:  no  Mammogram History:     Date of Last Mammogram:  11/08/2007   Mammogram History:     Date of Last Mammogram:  11/08/2007    Results:  normal   Mammogram History:     Date of Last Mammogram:  11/08/2007   Mammogram History:     Date of Last Mammogram:  11/08/2007    Results:  normal   Mammogram History:     Date of Last Mammogram:  11/08/2007   Mammogram History:     Date of Last Mammogram:  11/08/2007    Results:  normal    Review of Systems  General      Complains of fatigue.      Denies fever.  CV      Denies chest pain or discomfort.  Resp      Complains of cough, shortness of breath, and  wheezing.      Denies coughing up blood.  GI      Denies abdominal pain, bloody stools, and constipation.  GU      Denies abnormal vaginal bleeding and dysuria.   Physical Exam  General:     overweight appearing female in NAD Neck:     no carotid bruit or thyromegaly   Chest Wall:     No deformities, masses, or tenderness noted. Lungs:     normal respiratory effort and no accessory muscle use, diffuse expiratory and inspiratory wheeze. Wheezy cough when laughsnormal respiratory effort and no accessory muscle use.   Heart:     Normal rate and regular rhythm. S1 and S2 normal without gallop, murmur, click, rub or other extra sounds. Abdomen:     Bowel sounds positive,abdomen soft and non-tender without masses, organomegaly or hernias noted. Psych:     Oriented X3, memory intact for recent and remote, depressed affect, withdrawn, tearful, and slightly anxious.    Diabetes Management Exam:    Foot Exam (with socks and/or shoes not present):       Sensory-Pinprick/Light touch:           Left medial foot (L-4): normal          Left dorsal foot (L-5): normal          Left lateral foot (S-1): normal          Right medial foot (L-4): normal          Right dorsal foot (L-5): normal          Right lateral foot (S-1): normal       Sensory-Monofilament:          Left foot: normal          Right foot: normal       Inspection:          Left foot: normal          Right foot: normal       Nails:          Left foot: normal          Right foot: normal    Impression & Recommendations:  Problem # 1:  COPD, MILD (ICD-496) Pysical exam appears worse than recent PFTs suggesting mild COPD. Will start wellbutrin for smoking cessation as well as add ADVAIr for control in addition to atrovent (she did not tolerate spiriva)  Medications and use reviewed in detail with pt.  Her updated medication list for this problem includes:    Atrovent Hfa 17 Mcg/act Aers (Ipratropium bromide hfa) .[name]... 2 puff inhaled every 6 hours as needed for wheeze/shortness of breath    Advair Diskus 250-50 Mcg/dose Misc (Fluticasone-salmeterol) .[name]... 1 puff inh two times a day   Problem # 2:  DEPRESSION, ACUTE, RECURRENT (ICD-296.33) Change to wellbutrin, close follow up. Offered counseling pt refused.   Problem # 3:  DIABETES MELLITUS, TYPE II (ICD-250.00) Due for fasting lab reeval, was well controlled at last check. Encouraged regular 3 month OV.  Labs Reviewed: HgBA1c: 5.9 (06/24/2008)   Creat: 0.7 (06/24/2008)   Microalbumin: 0.4 (03/14/2008)   Problem # 4:  FLANK PAIN (ICD-789.09) Nontender to palpation in left ribcage... positional per pt. If continues will consider furhter workup given smoking history.   Problem # 5:  Preventive Health Care (ICD-V70.0) Assessment: Comment Only Overdue. Scheduled.   Complete Medication List: 1)  Neurontin 300 Mg Caps (Gabapentin) .... Take 3 capsules by mouth at bedtime  2)  Lipitor 40 Mg Tabs (Atorvastatin calcium) .... Take 1 tablet by mouth once a day 3)   Glucose Monitor of Choice  .... Check blood sugar daily dx 250.00 4)  Test Strips of Choice  .... Check blood sugar daily dx 250.00 5)  Lancets  .... Check blood sugar daily dx 250.00 6)  Budeprion Xl 150 Mg Xr24h-tab (Bupropion hcl) .[name].. 1 tab by mouth daily 7)  Atrovent Hfa 17 Mcg/act Aers (Ipratropium bromide hfa) .... 2 puff inhaled every 6 hours as needed for wheeze/shortness of breath 8)  Advair Diskus 250-50 Mcg/dose Misc (Fluticasone-salmeterol) .[name].. 1 puff inh two times a day  Diabetes Management Assessment/Plan:      The following lipid goals have been established for the patient: Total cholesterol goal of 200; LDL cholesterol goal of 130; HDL cholesterol goal of 40; Triglyceride goal of 150.     Patient Instructions: 1)  Due for CPE with pap in next month. 2)  Fasting lipids, CMET, A1C Dx 250.0 3)  Use atrovent as needed for wheeze/ resucue. 4)  USe advair as daily controlling medicaiton.  5)  QUit smoking!!!!   Prescriptions: ADVAIR DISKUS 250-50 MCG/DOSE MISC (FLUTICASONE-SALMETEROL) 1 puff inh two times a day  #1 x 3   Entered and Authorized by:   Eliezer Lofts MD   Signed by:   Eliezer Lofts MD on 11/10/2008   Method used:   Electronically to        [facility] (retail)       [address]       [place], [place]  [address]       Ph: [phone]       Fax: [phone]   [id number] ATROVENT HFA 17 MCG/ACT AERS (IPRATROPIUM BROMIDE HFA) 2 puff inhaled every 6 hours as needed for wheeze/shortness of breath  #1 x 3   Entered and Authorized by:   Eliezer Lofts MD   Signed by:   Eliezer Lofts MD on 11/10/2008   Method used:   Electronically to        [facility] (retail)       [address]       [place], [place]  [address]       Ph: [phone]       Fax: [phone]   [id number] BUDEPRION XL 150 MG XR24H-TAB (BUPROPION HCL) 1 tab by mouth daily  #30 x 3   Entered and Authorized by:   Eliezer Lofts MD   Signed by:   Eliezer Lofts MD on  11/10/2008   Method used:   Electronically to        [facility] (retail)       [address]       [place], [place]  [address]       Ph: [phone]       Fax: [phone]   [id number]   Current Allergies (reviewed today): No known allergies  Current Medications (including changes made in today's visit):  NEURONTIN 300 MG  CAPS (GABAPENTIN) Take 3 capsules by mouth at bedtime LIPITOR 40 MG  TABS (ATORVASTATIN CALCIUM) Take 1 tablet by mouth once a day * GLUCOSE MONITOR OF CHOICE Check blood sugar daily Dx 250.00 * TEST STRIPS OF CHOICE Check blood sugar daily Dx 250.00 * LANCETS Check blood sugar daily Dx 250.00 BUDEPRION XL 150 MG XR24H-TAB (BUPROPION HCL) 1 tab by mouth daily ATROVENT HFA 17 MCG/ACT AERS (IPRATROPIUM BROMIDE HFA) 2 puff inhaled every  6 hours as needed for wheeze/shortness of breath ADVAIR DISKUS 250-50 MCG/DOSE MISC (FLUTICASONE-SALMETEROL) 1 puff inh two times a day    Mammogram Result Date:  11/08/2007 Mammogram Result:  normal Mammogram Next Due:  1 yr  Appended Document: Orders Update    Clinical Lists Changes  Orders: Added new Service order of Est. Patient Level IV [id number]) - Signed

## 2010-11-06 NOTE — Progress Notes (Signed)
Summary: Gabapentin  Phone Note Refill Request Message from:  Fax from Pharmacy on January 20, 2009 12:05 PM  Refills Requested: Medication #1:  NEURONTIN 300 MG  CAPS Take 3 capsules by mouth at bedtime [place]  Phone:   [phone]   Method Requested: Electronic Initial call taken by: Christena Deem,  January 20, 2009 12:05 PM      Prescriptions: NEURONTIN 300 MG  CAPS (GABAPENTIN) Take 3 capsules by mouth at bedtime  #90 x 6   Entered and Authorized by:   Owens Loffler MD   Signed by:   Owens Loffler MD on 01/20/2009   Method used:   Electronically to        [facility] (retail)       [address]       [place], [place]  [address]       Ph: [phone]       Fax: [phone]   [id number]

## 2010-11-06 NOTE — Progress Notes (Signed)
Summary: PT MISSED APPT FOR CPX.Marland KitchenMarland KitchenNEED LAB RESULTS   Phone Note Call from Patient Call back at [phone]   Reason for Call: Talk to Nurse Summary of Call: Pt came for appt,,,to late, rescheduled.  Wants to know results of labs. Gave # to phone tree.Marland KitchenMarland KitchenCPX scheduled for May 2010.Marland KitchenMarland KitchenMarland KitchenAllen Norris  December 19, 2008 1:26 PM Initial call taken by: Allen Norris,  December 19, 2008 1:26 PM  Follow-up for Phone Call        Call pt. I was going to discuss labs at CPE. You can give her lab results, but she needs to reschedule CPE to discuss furhter.  Follow-up by: Eliezer Lofts MD,  December 19, 2008 3:13 PM  Additional Follow-up for Phone Call Additional follow up Details #1::        Resulted on PhoneTree.  Additional Follow-up by: Christena Deem,  December 19, 2008 3:23 PM      Appended Document: PT MISSED APPT FOR CPX.Marland KitchenMarland KitchenNEED LAB RESULTS  Patient has rescheduled but cannot get back in for a CPX until May 25th.  I gave her results over the PhoneTree but no comments of yours were attached.  Is it okay for her to wait that long to do anything concerning the labs?

## 2010-11-06 NOTE — Letter (Signed)
Summary: Results Follow up Letter  [place] at [facility]  [address] [place], [place] [address]   Phone: [phone]  Fax: [phone]    03/03/2009 MRN: [id number]  [place], [place]  [address]  Dear Ms. Theodoro Clock,  The following are the results of your recent test(s):  Test         Result    Pap Smear:        Normal __x___  Not Normal _____ Comments:  Your pap smear reort was normal. Please make sure to repeat in one year. Enclosed is a copy of your report. ______________________________________________________ Cholesterol: LDL(Bad cholesterol):         Your goal is less than:         HDL (Good cholesterol):       Your goal is more than: Comments:  ______________________________________________________ Mammogram:        Normal _____  Not Normal _____ Comments:  ___________________________________________________________________ Hemoccult:        Normal _____  Not normal _______ Comments:    _____________________________________________________________________ Other Tests:    We routinely do not discuss normal results over the telephone.  If you desire a copy of the results, or you have any questions about this information we can discuss them at your next office visit.   Sincerely,

## 2010-11-06 NOTE — Assessment & Plan Note (Signed)
Summary: STOP SMOKING / LFW   Vital Signs:  Patient profile:   65 year old female Height:      62 inches Weight:      149.38 pounds BMI:     27.42 Temp:     98.3 degrees F Pulse rate:   104 / minute Pulse rhythm:   regular BP sitting:   140 / 84  (left arm)  Vitals Entered By: Christena Deem (February 02, 2009 10:53 AM)  History of Present Illness: Chief Complaint:  Wants to stop smoking Wants refills on Neurontin 300mg    Wellbutrin in past didn't help. Has also tried Chantix ...stopped for 6 weeks. Last year tried Chantix again but was irritable and didn't tolerate it.  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 No longer around boyfriend who smookes.  Volunteers once a week. Has friends encouraging her to quit.   Has been constipatied ;ately.  " Any colon cleanse I should use?"  Problems Prior to Update: 1)  Anemia, Normocytic  (ICD-285.9) 2)  Flank Pain  (ICD-789.09) 3)  Neck Pain, Left  (ICD-723.1) 4)  Copd, Mild  (ICD-496) 5)  Depression, Acute, Recurrent  (ICD-296.33) 6)  Foot Pain, Bilateral  (ICD-729.5) 7)  Diabetes Mellitus, Type II  (ICD-250.00) 8)  Hyperlipidemia, Mixed  (ICD-272.2) 9)  Examination, Routine Medical  (ICD-V70.0) 10)  Hypercholesterolemia, Pure  (ICD-272.0) 11)  Cigarette Smoker  (ICD-305.1) 12)  Cholelithiasis w/o Cholecystitis w/o Obst  (ICD-574.20)  Current Medications (verified): 1)  Neurontin 300 Mg  Caps (Gabapentin) .... Take 3 Capsules By Mouth At Bedtime 2)  Lipitor 80 Mg Tabs (Atorvastatin Calcium) .... Take 1 Tablet By Mouth Once A Day 3)  Glucose Monitor of Choice .... Check Blood Sugar Daily Dx 250.00 4)  Test Strips of Choice .... Check Blood Sugar Daily Dx 250.00 5)  Lancets .... Check Blood Sugar Daily Dx 250.00 6)  Atrovent Hfa 17 Mcg/act Aers (Ipratropium Bromide Hfa) .... 2 Puff Inhaled Every 6 Hours As Needed For Wheeze/shortness of Breath 7)  Advair Diskus 250-50 Mcg/dose Misc  (Fluticasone-Salmeterol) .[name].. 1 Puff Inh Two Times A Day 8)  Cymbalta 30 Mg Cpep (Duloxetine Hcl) .... Take 1 Capsule By Mouth Two Times A Day 9)  Chantix Starting Month Pak 0.5 Mg X 11 & 1 Mg X 42 Tabs (Varenicline Tartrate) .... As Directed. 10)  Chantix Continuing Month Pak 1 Mg Tabs (Varenicline Tartrate) .[name].. 1 Tab By Mouth Two Times A Day  Allergies (verified): No Known Drug Allergies  Past History:  Past medical, surgical, family and social histories (including risk factors) reviewed, and no changes noted (except as noted below).  Past Medical History:    Reviewed history from 04/06/2008 and no changes required:    Current Problems:     COPD, MILD (ICD-496)    DEPRESSION, ACUTE, RECURRENT (ICD-296.33)    FOOT PAIN, BILATERAL (ICD-729.5)    HYPERGLYCEMIA (ICD-790.29)    HYPERLIPIDEMIA, MIXED (ICD-272.2)    EXAMINATION, ROUTINE MEDICAL (ICD-V70.0)    HYPERCHOLESTEROLEMIA, PURE (ICD-272.0)    CIGARETTE SMOKER (ICD-305.1)    CHOLELITHIASIS W/O CHOLECYSTITIS W/O OBST (ICD-574.20)       Past Surgical History:    Reviewed history from 07/09/2007 and no changes required:    2005 lumpectomy  Family History:    Reviewed history and no changes required:  Social History:    Reviewed history from 04/06/2008 and no changes required:       Current  Smoker  Review of Systems General:  Denies fatigue. Resp:  Complains of shortness of breath and wheezing.  Physical Exam  General:  Well-developed,well-nourished,in no acute distress; alert,appropriate and cooperative throughout examination Lungs:  normal respiratory effort and no accessory muscle use, diffuse expiratory and inspiratory wheeze. Wheezy cough when laughsnormal respiratory effort and no accessory muscle use.   Heart:  Normal rate and regular rhythm. S1 and S2 normal without gallop, murmur, click, rub or other extra sounds.   Impression & Recommendations:  Problem # 1:  CIGARETTE SMOKER (ICD-305.1)  Spent 20 minutes  counseling on smoking cessation, medication, SE etc. Has set stop date. Given info on quit helpline. Will follow up at CPE in 1 month as scheduled.  Her updated medication list for this problem includes:    Chantix Starting Month Pak 0.5 Mg X 11 & 1 Mg X 42 Tabs (Varenicline tartrate) .[name]... As directed.    Chantix Continuing Month Pak 1 Mg Tabs (Varenicline tartrate) .[name]... 1 tab by mouth two times a day  Orders: Tobacco use cessation intensive >10 minutes [id number])  Problem # 2:  CONSTIPATION (ICD-564.00) Increase water, fiber and exercise. Use miralax as needed.Call if notimproving.   Complete Medication List: 1)  Neurontin 300 Mg Caps (Gabapentin) .... Take 3 capsules by mouth at bedtime 2)  Lipitor 80 Mg Tabs (Atorvastatin calcium) .... Take 1 tablet by mouth once a day 3)  Glucose Monitor of Choice  .... Check blood sugar daily dx 250.00 4)  Test Strips of Choice  .... Check blood sugar daily dx 250.00 5)  Lancets  .... Check blood sugar daily dx 250.00 6)  Atrovent Hfa 17 Mcg/act Aers (Ipratropium bromide hfa) .... 2 puff inhaled every 6 hours as needed for wheeze/shortness of breath 7)  Advair Diskus 250-50 Mcg/dose Misc (Fluticasone-salmeterol) .[name].. 1 puff inh two times a day 8)  Cymbalta 30 Mg Cpep (Duloxetine hcl) .... Take 1 capsule by mouth two times a day 9)  Chantix Starting Month Pak 0.5 Mg X 11 & 1 Mg X 42 Tabs (Varenicline tartrate) .... As directed. 10)  Chantix Continuing Month Pak 1 Mg Tabs (Varenicline tartrate) .[name].. 1 tab by mouth two times a day  Patient Instructions: 1)  INcrease fiber in diet...add Benefiber. 2)  Increase water in diet.  3)  May use miralax for constipation Prescriptions: CHANTIX CONTINUING MONTH PAK 1 MG TABS (VARENICLINE TARTRATE) 1 tab by mouth two times a day  #1 pack x 2   Entered and Authorized by:   Eliezer Lofts MD   Signed by:   Eliezer Lofts MD on 02/02/2009   Method used:   Electronically to        [facility] (retail)       [address]       [place], [place]  [address]       Ph: [phone]       Fax: [phone]   [id number] CHANTIX STARTING MONTH PAK 0.5 MG X 11 & 1 MG X 42 TABS (VARENICLINE TARTRATE) As directed.  #1 x 0   Entered and Authorized by:   Eliezer Lofts MD   Signed by:   Eliezer Lofts MD on 02/02/2009   Method used:   Electronically to        [facility] (retail)       [address]       [place], [place]  [address]       Ph: [phone]  Fax: [phone]   RxIDIP:[id number] NEURONTIN 300 MG  CAPS (GABAPENTIN) Take 3 capsules by mouth at bedtime  #270 x 3   Entered and Authorized by:   Eliezer Lofts MD   Signed by:   Eliezer Lofts MD on 02/02/2009   Method used:   Electronically to        [facility] (retail)       [address]       [place], [place]  [address]       Ph: [phone]       Fax: [phone]   [id number]     Current Allergies (reviewed today): No known allergies
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
## 2010-11-06 NOTE — Progress Notes (Signed)
Summary: ? BS monitor  Phone Note Call from Patient Call back at Home Phone [phone]   Caller: Patient Call For: Dr. Diona Browner Summary of Call: Patient states that when she went to the Nutrition classes, they suggested that she monitor her blood sugars.  Patient wants to know how she goes about getting a monitor?  Do we still have some here?  if so, she will need the Rx. for the supplies sent to her pharmacy. Initial call taken by: Christena Deem,  July 13, 2008 4:32 PM  Follow-up for Phone Call        Dr. Council Mechanic has some if she cannot afford it, but pt should research what type she wants on internet I will write a rx from [facility] choisce, lancets and strips of choice. PLease callin Rx's because I cannot send those through EMR because they are uncoded.  Follow-up by: Eliezer Lofts MD,  July 13, 2008 5:08 PM  Additional Follow-up for Phone Call Additional follow up Details #1::        Patient Advised.   She will get back to [place] on what she decides upon. Additional Follow-up by: Christena Deem,  July 13, 2008 5:19 PM    New/Updated Medications: * GLUCOSE MONITOR OF CHOICE Check blood sugar daily Dx 250.00 * TEST STRIPS OF CHOICE Check blood sugar daily Dx 250.00 * LANCETS Check blood sugar daily Dx 250.00   Prescriptions: LANCETS Check blood sugar daily Dx 250.00  #1 box x 11   Entered and Authorized by:   Eliezer Lofts MD   Signed by:   Eliezer Lofts MD on 07/13/2008   Method used:   Telephoned to ...       [facility] (retail)       [address]       [place], [place]  [address]       Ph: [phone]       Fax: [phone]   RxID:   [id number] TEST STRIPS OF CHOICE Check blood sugar daily Dx 250.00  #1 box x 11   Entered and Authorized by:   Eliezer Lofts MD   Signed by:   Eliezer Lofts MD on 07/13/2008   Method used:   Telephoned to ...       [facility] (retail)       [address]       [place], [place]  [address]       Ph: [phone]       Fax:  [phone]   RxID:   [id number] GLUCOSE MONITOR OF CHOICE Check blood sugar daily Dx 250.00  #1 x 0   Entered and Authorized by:   Eliezer Lofts MD   Signed by:   Eliezer Lofts MD on 07/13/2008   Method used:   Telephoned to ...       [facility] (retail)       [place]       [place], [place]  [address]       Ph: [phone]       Fax: [phone]   RxID:   [id number]

## 2010-11-06 NOTE — Assessment & Plan Note (Signed)
Summary: DISCUSS RESULT FROM STRESS TEST/FATTY TUMOR/[facility]   Vital Signs:  Patient profile:   65 year old female Weight:      147.38 pounds BMI:     27.05 Temp:     97.3 degrees F oral Pulse rate:   100 / minute Pulse rhythm:   regular BP sitting:   130 / 70  (left arm) Cuff size:   regular  Vitals Entered By: Sherrian Divers CMA Deborra Medina) (September 19, 2009 4:08 PM) CC: discuss results of stress test/fatty tumor   History of Present Illness: Left sided chest pain...cardiac stress test was low risk. pt does have hx of left breast adenocarcinoma. Breast exam was normal last OV, no axillary lymph nodes palpated.  Pt states she has noted swelling over left collarbone x 1 week. non tender.  Has not had any further chest pain. Stable SOB, smoker.   Discussed issue with radiologist... Has MRI cervical spine in 2009. Recommeneded chest cT to eval region, should be seen with this study.   Problems Prior to Update: 1)  Abnormal Electrocardiogram  (ICD-794.31) 2)  Hypertension, Benign Essential  (ICD-401.1) 3)  Chest Pain, Atypical  (ICD-786.59) 4)  Hx of Adenocarcinoma, Breast  (ICD-174.9) 5)  Routine Gynecological Examination  (ICD-V72.31) 6)  Well Woman  (ICD-V70.0) 7)  Disturbance of Skin Sensation  (ICD-782.0) 8)  Special Screening For Osteoporosis  (ICD-V82.81) 9)  Constipation  (ICD-564.00) 10)  Anemia, Normocytic  (ICD-285.9) 11)  Flank Pain  (ICD-789.09) 12)  Neck Pain, Left  (ICD-723.1) 13)  Copd, Mild  (ICD-496) 14)  Depression, Acute, Recurrent  (ICD-296.33) 15)  Foot Pain, Bilateral  (ICD-729.5) 16)  Diabetes Mellitus, Type II  (ICD-250.00) 17)  Hyperlipidemia, Mixed  (ICD-272.2) 18)  Examination, Routine Medical  (ICD-V70.0) 19)  Hypercholesterolemia, Pure  (ICD-272.0) 20)  Cigarette Smoker  (ICD-305.1) 21)  Cholelithiasis w/o Cholecystitis w/o Obst  (ICD-574.20)  Current Medications (verified): 1)  Neurontin 300 Mg  Caps (Gabapentin) .... Take 3 Capsules By Mouth At  Bedtime 2)  Crestor 20 Mg Tabs (Rosuvastatin Calcium) .[name].. 1 Tab By Mouth Daily 3)  Glucose Monitor of Choice .... Check Blood Sugar Daily Dx 250.00 4)  Test Strips of Choice .... Check Blood Sugar Daily Dx 250.00 5)  Lancets .... Check Blood Sugar Daily Dx 250.00 6)  Advair Diskus 250-50 Mcg/dose Misc (Fluticasone-Salmeterol) .[name].. 1 Puff Inh Two Times A Day 7)  Cymbalta 30 Mg Cpep (Duloxetine Hcl) .... Take 1 Capsule By Mouth Two Times A Day 8)  Fish Oil 1000 Mg Caps (Omega-3 Fatty Acids) .[name].. 1 Tab By Mouth Two Times A Day  Allergies (verified): No Known Drug Allergies  Past History:  Past medical, surgical, family and social histories (including risk factors) reviewed, and no changes noted (except as noted below).  Past Medical History: Reviewed history from 04/06/2008 and no changes required. Current Problems:  COPD, MILD (ICD-496) DEPRESSION, ACUTE, RECURRENT (ICD-296.33) FOOT PAIN, BILATERAL (ICD-729.5) HYPERGLYCEMIA (ICD-790.29) HYPERLIPIDEMIA, MIXED (ICD-272.2) EXAMINATION, ROUTINE MEDICAL (ICD-V70.0) HYPERCHOLESTEROLEMIA, PURE (ICD-272.0) CIGARETTE SMOKER (ICD-305.1) CHOLELITHIASIS W/O CHOLECYSTITIS W/O OBST (ICD-574.20)    Past Surgical History: Reviewed history from 07/09/2007 and no changes required. 2005 lumpectomy  Family History: Reviewed history and no changes required.  Social History: Reviewed history from 04/06/2008 and no changes required. Current Smoker  Review of Systems General:  Denies fatigue. CV:  Complains of chest pain or discomfort. Resp:  Denies sputum productive and wheezing. GI:  Denies abdominal pain.  Physical Exam  General:  overweight appearing female in  NAD, smells of smoke Mouth:  Oral mucosa and oropharynx without lesions or exudates.  Poor dentition Neck:  no carotid bruit or thyromegaly .nodes Chest Wall:  non tender to palpation  Breasts:  examined at last OV.  Lungs:  Normal respiratory effort, chest expands  symmetrically. Lungs are clear to auscultation, no crackles or wheezes. Heart:  Normal rate and regular rhythm. S1 and S2 normal without gallop, murmur, click, rub or other extra sounds. Msk:  left inferior clavicle with soft mobile 1.5 sm poorly circumscribed lesion, non tender, no erythema Pulses:  R and L carotid,radial,femoral,dorsalis pedis and posterior tibial pulses are full and equal bilaterally Extremities:  No clubbing, cyanosis, edema, or deformity noted with normal full range of motion of all joints.     Impression & Recommendations:  Problem # 1:  NEOPLASMS UNSPEC NATURE BONE SOFT TISSUE&SKIN (ICD-239.2) Will eval further with CT of chest with contrast given mass in smoker, with hisorty of breast cancer, left breast.  Orders: Radiology Referral (Radiology)  Problem # 2:  CHEST PAIN, ATYPICAL (ICD-786.59) Resolved..likely MSK or past surgical scar pain. MAmmogram nml recently.   Problem # 3:  Hx of ADENOCARCINOMA, BREAST (ICD-174.9)  Problem # 4:  CIGARETTE SMOKER (ICD-305.1)  Complete Medication List: 1)  Neurontin 300 Mg Caps (Gabapentin) .... Take 3 capsules by mouth at bedtime 2)  Crestor 20 Mg Tabs (Rosuvastatin calcium) .[name].. 1 tab by mouth daily 3)  Glucose Monitor of Choice  .... Check blood sugar daily dx 250.00 4)  Test Strips of Choice  .... Check blood sugar daily dx 250.00 5)  Lancets  .... Check blood sugar daily dx 250.00 6)  Advair Diskus 250-50 Mcg/dose Misc (Fluticasone-salmeterol) .[name].. 1 puff inh two times a day 7)  Cymbalta 30 Mg Cpep (Duloxetine hcl) .... Take 1 capsule by mouth two times a day 8)  Fish Oil 1000 Mg Caps (Omega-3 fatty acids) .[name].. 1 tab by mouth two times a day  Patient Instructions: 1)  Add TSH to upcoming labs in 12/04/2009 Dx 311 2)  ]Referral Appointment Information 3)  Day/Date: 4)  Time: 5)  Place/MD: 6)  Address: 7)  Phone/Fax: 8)  Patient given appointment information. Information/Orders faxed/mailed.   Current Allergies  (reviewed today): No known allergies   Appended Document: Orders Update    Clinical Lists Changes  Orders: Added new Test order of TLB-BMP (Basic Metabolic Panel-BMET) (99991111) - Signed

## 2010-11-06 NOTE — Progress Notes (Signed)
Summary: re referral  Phone Note Call from Patient Call back at Home Phone [phone]   Caller: Patient Call For: dr Diona Browner Summary of Call: pt has brought in notes from chiropractor recommending referral to neurosurgeon, notes are on your desk Initial call taken by: Marty Heck,  May 03, 2008 9:05 AM  Follow-up for Phone Call        Referral sent

## 2010-11-06 NOTE — Assessment & Plan Note (Signed)
Summary: 3 MONTH FOLLOW UPR/BH   Vital Signs:  Patient profile:   65 year old female Height:      62 inches Weight:      151.4 pounds BMI:     27.79 Temp:     98.3 degrees F oral Pulse rate:   88 / minute Pulse rhythm:   regular BP sitting:   140 / 80  (left arm) Cuff size:   regular  Vitals Entered By: Zenda Alpers CMA Deborra Medina) (September 05, 2009 3:42 PM)  History of Present Illness: Chief complaint 3 month follow up  DM, worsened control: No clear cause..no weight gain. [name] Has not been doing as well with the diet..had feel it was doing well..so she stated increase sugar in diet Hasn't had a meter to check them at home.  Not on any medication.   Hypertension History:      BP at home 120/80.        Positive major cardiovascular risk factors include female age 62 years old or older, diabetes, hyperlipidemia, hypertension, and current tobacco user.  Negative major cardiovascular risk factors include negative family history for ischemic heart disease.        Further assessment for target organ damage reveals no history of ASHD, stroke/TIA, or peripheral vascular disease.     Problems Prior to Update: 1)  Hx of Adenocarcinoma, Breast  (ICD-174.9) 2)  Routine Gynecological Examination  (ICD-V72.31) 3)  Well Woman  (ICD-V70.0) 4)  Disturbance of Skin Sensation  (ICD-782.0) 5)  Special Screening For Osteoporosis  (ICD-V82.81) 6)  Constipation  (ICD-564.00) 7)  Anemia, Normocytic  (ICD-285.9) 8)  Flank Pain  (ICD-789.09) 9)  Neck Pain, Left  (ICD-723.1) 10)  Copd, Mild  (ICD-496) 11)  Depression, Acute, Recurrent  (ICD-296.33) 12)  Foot Pain, Bilateral  (ICD-729.5) 13)  Diabetes Mellitus, Type II  (ICD-250.00) 14)  Hyperlipidemia, Mixed  (ICD-272.2) 15)  Examination, Routine Medical  (ICD-V70.0) 16)  Hypercholesterolemia, Pure  (ICD-272.0) 17)  Cigarette Smoker  (ICD-305.1) 18)  Cholelithiasis w/o Cholecystitis w/o Obst  (ICD-574.20)  Current Medications (verified): 1)   Neurontin 300 Mg  Caps (Gabapentin) .... Take 3 Capsules By Mouth At Bedtime 2)  Crestor 20 Mg Tabs (Rosuvastatin Calcium) .[name].. 1 Tab By Mouth Daily 3)  Glucose Monitor of Choice .... Check Blood Sugar Daily Dx 250.00 4)  Test Strips of Choice .... Check Blood Sugar Daily Dx 250.00 5)  Lancets .... Check Blood Sugar Daily Dx 250.00 6)  Advair Diskus 250-50 Mcg/dose Misc (Fluticasone-Salmeterol) .[name].. 1 Puff Inh Two Times A Day 7)  Cymbalta 30 Mg Cpep (Duloxetine Hcl) .... Take 1 Capsule By Mouth Two Times A Day 8)  Fish Oil 1000 Mg Caps (Omega-3 Fatty Acids) .[name].. 1 Tab By Mouth Two Times A Day  Allergies (verified): No Known Drug Allergies  Past History:  Past medical, surgical, family and social histories (including risk factors) reviewed, and no changes noted (except as noted below).  Past Medical History: Reviewed history from 04/06/2008 and no changes required. Current Problems:  COPD, MILD (ICD-496) DEPRESSION, ACUTE, RECURRENT (ICD-296.33) FOOT PAIN, BILATERAL (ICD-729.5) HYPERGLYCEMIA (ICD-790.29) HYPERLIPIDEMIA, MIXED (ICD-272.2) EXAMINATION, ROUTINE MEDICAL (ICD-V70.0) HYPERCHOLESTEROLEMIA, PURE (ICD-272.0) CIGARETTE SMOKER (ICD-305.1) CHOLELITHIASIS W/O CHOLECYSTITIS W/O OBST (ICD-574.20)    Past Surgical History: Reviewed history from 07/09/2007 and no changes required. 2005 lumpectomy  Family History: Reviewed history and no changes required.  Social History: Reviewed history from 04/06/2008 and no changes required. Current Smoker  Review of Systems General:  Denies fatigue and  fever. CV:  in last 2-3 years...momentary sharp pain in chest..occurs when bending over twisting in unusual way..no exertional component...near are where lumpectomy..2005. Resp:  stable SOB on Advair. GI:  Denies abdominal pain, bloody stools, constipation, diarrhea, indigestion, nausea, and vomiting. GU:  Denies dysuria.   Impression & Recommendations:  Problem # 1:  CHEST PAIN,  ATYPICAL (ICD-786.59)  Likely musculoskeletal vs scarring. No abnormality on exam.  EKG showed ? Q waves.. ? past ischemia.Joselyne Kolis is very high risk with smoking, DM and HTN. She has never had a heart stress test. Will send for Cardiolyte.   Orders: Cardiolite (Cardiolite)  Problem # 2:  HYPERTENSION, BENIGN ESSENTIAL (ICD-401.1)  New diagnosis. Follow at home... call if not at goal BP >130/80.  Orders: EKG w/ Interpretation (93000)  Problem # 3:  COPD, MILD (ICD-496) stable on advair Her updated medication list for this problem includes:    Advair Diskus 250-50 Mcg/dose Misc (Fluticasone-salmeterol) .[name]... 1 puff inh two times a day  Problem # 4:  DIABETES MELLITUS, TYPE II (ICD-250.00) worsened control...discussed diet cahnge...may be progression of disease. pt hesitant about starting medicaiton. Recheck in 3 months, if above 7 will start metformin.   Problem # 5:  HYPERLIPIDEMIA, MIXED (ICD-272.2) Due for reeval in 3 months..LDL at goal but triglycerides were high.  Her updated medication list for this problem includes:    Crestor 20 Mg Tabs (Rosuvastatin calcium) .[name]... 1 tab by mouth daily  Complete Medication List: 1)  Neurontin 300 Mg Caps (Gabapentin) .... Take 3 capsules by mouth at bedtime 2)  Crestor 20 Mg Tabs (Rosuvastatin calcium) .[name].. 1 tab by mouth daily 3)  Glucose Monitor of Choice  .... Check blood sugar daily dx 250.00 4)  Test Strips of Choice  .... Check blood sugar daily dx 250.00 5)  Lancets  .... Check blood sugar daily dx 250.00 6)  Advair Diskus 250-50 Mcg/dose Misc (Fluticasone-salmeterol) .[name].. 1 puff inh two times a day 7)  Cymbalta 30 Mg Cpep (Duloxetine hcl) .... Take 1 capsule by mouth two times a day 8)  Fish Oil 1000 Mg Caps (Omega-3 fatty acids) .[name].. 1 tab by mouth two times a day  Hypertension Assessment/Plan:      The patient's hypertensive risk group is category C: Target organ damage and/or diabetes.  Her calculated 10 year risk of  coronary heart disease is > 32 %.  Today's blood pressure is 140/80.  Her blood pressure goal is < 130/80.  Patient Instructions: 1)  Please schedule a follow-up appointment in 3 months  30 min.  2)  BMP prior to visit, ICD-9: 250.00 3)  Hepatic Panel prior to visit ICD-9:  4)  Lipid panel prior to visit ICD-9 :  5)  HgBA1c prior to visit  ICD-9:  6)  Referral Appointment Information 7)  Day/Date: 8)  Time: 9)  Place/MD: 10)  Address: 11)  Phone/Fax: 12)  Patient given appointment information. Information/Orders faxed/mailed.   Current Allergies (reviewed today): No known allergies    Influenza Vaccine (to be given today)   Appended Document: 3 MONTH FOLLOW UPR/BH breast exam normal no chest wall ttp.

## 2010-11-06 NOTE — Assessment & Plan Note (Signed)
Summary: 3 month follow up dm check/rbh   Vital Signs:  Patient profile:   65 year old female Height:      62 inches Weight:      152.50 pounds BMI:     27.99 Temp:     98 degrees F oral Pulse rate:   100 / minute Pulse rhythm:   regular BP sitting:   122 / 76  (left arm) Cuff size:   regular  Vitals Entered By: Mayes (May 29, 2009 12:06 PM) CC: follow-up visit - 3 months DM.  Needs refills on Advair and Cymbalta, Lipid Management   History of Present Illness: Did not tolerate Chantix due to mood changes. Still smoking. Called Quit Now.  Interested in info on  nicotine replacement.   Lipid Management History:      Positive NCEP/ATP III risk factors include female age 54 years old or older, diabetes, HDL cholesterol less than 40, and current tobacco user.  Negative NCEP/ATP III risk factors include no history of early menopause without estrogen hormone replacement, no family history for ischemic heart disease, non-hypertensive, no ASHD (atherosclerotic heart disease), no prior stroke/TIA, no peripheral vascular disease, and no history of aortic aneurysm.        Her compliance with the TLC diet is fair.  The patient expresses understanding of adjunctive measures for cholesterol lowering.  Adjunctive measures started by the patient include aerobic exercise and weight reduction.  She expresses no side effects from her lipid-lowering medication.  The patient denies any symptoms to suggest myopathy or liver disease.      Problems Prior to Update: 1)  Hx of Adenocarcinoma, Breast  (ICD-174.9) 2)  Routine Gynecological Examination  (ICD-V72.31) 3)  Well Woman  (ICD-V70.0) 4)  Disturbance of Skin Sensation  (ICD-782.0) 5)  Special Screening For Osteoporosis  (ICD-V82.81) 6)  Constipation  (ICD-564.00) 7)  Anemia, Normocytic  (ICD-285.9) 8)  Flank Pain  (ICD-789.09) 9)  Neck Pain, Left  (ICD-723.1) 10)  Copd, Mild  (ICD-496) 11)  Depression, Acute, Recurrent   (ICD-296.33) 12)  Foot Pain, Bilateral  (ICD-729.5) 13)  Diabetes Mellitus, Type II  (ICD-250.00) 14)  Hyperlipidemia, Mixed  (ICD-272.2) 15)  Examination, Routine Medical  (ICD-V70.0) 16)  Hypercholesterolemia, Pure  (ICD-272.0) 17)  Cigarette Smoker  (ICD-305.1) 18)  Cholelithiasis w/o Cholecystitis w/o Obst  (ICD-574.20)  Current Medications (verified): 1)  Neurontin 300 Mg  Caps (Gabapentin) .... Take 3 Capsules By Mouth At Bedtime 2)  Crestor 20 Mg Tabs (Rosuvastatin Calcium) .[name].. 1 Tab By Mouth Daily 3)  Glucose Monitor of Choice .... Check Blood Sugar Daily Dx 250.00 4)  Test Strips of Choice .... Check Blood Sugar Daily Dx 250.00 5)  Lancets .... Check Blood Sugar Daily Dx 250.00 6)  Advair Diskus 250-50 Mcg/dose Misc (Fluticasone-Salmeterol) .[name].. 1 Puff Inh Two Times A Day 7)  Cymbalta 30 Mg Cpep (Duloxetine Hcl) .... Take 1 Capsule By Mouth Two Times A Day 8)  Fish Oil 1000 Mg Caps (Omega-3 Fatty Acids) .[name].. 1 Tab By Mouth Two Times A Day  Allergies (verified): No Known Drug Allergies  Past History:  Past medical, surgical, family and social histories (including risk factors) reviewed, and no changes noted (except as noted below).  Past Medical History: Reviewed history from 04/06/2008 and no changes required. Current Problems:  COPD, MILD (ICD-496) DEPRESSION, ACUTE, RECURRENT (ICD-296.33) FOOT PAIN, BILATERAL (ICD-729.5) HYPERGLYCEMIA (ICD-790.29) HYPERLIPIDEMIA, MIXED (ICD-272.2) EXAMINATION, ROUTINE MEDICAL (ICD-V70.0) HYPERCHOLESTEROLEMIA, PURE (ICD-272.0) CIGARETTE SMOKER (ICD-305.1) CHOLELITHIASIS W/O CHOLECYSTITIS W/O  OBST (ICD-574.20)    Past Surgical History: Reviewed history from 07/09/2007 and no changes required. 2005 lumpectomy  Family History: Reviewed history and no changes required.  Social History: Reviewed history from 04/06/2008 and no changes required. Current Smoker  Review of Systems General:  Denies fatigue and fever. CV:  Denies  swelling of feet. Resp:  Denies shortness of breath, sputum productive, and wheezing.  Physical Exam  General:  overweight appearing female in NAD, smells of smoke Mouth:  Oral mucosa and oropharynx without lesions or exudates.  Poor dentition Neck:  no carotid bruit or thyromegaly .nodes Lungs:  Normal respiratory effort, chest expands symmetrically. Lungs are clear to auscultation, no crackles or wheezes. Heart:  Normal rate and regular rhythm. S1 and S2 normal without gallop, murmur, click, rub or other extra sounds. Pulses:  R and L carotid,radial,femoral,dorsalis pedis and posterior tibial pulses are full and equal bilaterally Extremities:  No clubbing, cyanosis, edema, or deformity noted with normal full range of motion of all joints.     Impression & Recommendations:  Problem # 1:  DIABETES MELLITUS, TYPE II (ICD-250.00) Well controlled with diet. Encouraged exercise, weight loss, healthy eating habits.  Labs Reviewed: Creat: 0.8 (12/15/2008)    Reviewed HgBA1c results: 6.2 (02/24/2009)  6.2 (12/15/2008)  Problem # 2:  HYPERLIPIDEMIA, MIXED (ICD-272.2)  LDL at goal. Continue to work on diet and lifestyle change to improve HDLa nd triglycerides.  Her updated medication list for this problem includes:    Crestor 20 Mg Tabs (Rosuvastatin calcium) .[name]... 1 tab by mouth daily  Labs Reviewed: SGOT: 26 (02/24/2009)   SGPT: 31 (02/24/2009)  Lipid Goals: Chol Goal: 200 (07/09/2007)   HDL Goal: 40 (07/09/2007)   LDL Goal: 100 (02/28/2009)   TG Goal: 150 (07/09/2007)  10 Yr Risk Heart Disease: 20 % Prior 10 Yr Risk Heart Disease: Not enough information (07/09/2007)   HDL:35.60 (02/24/2009), 35.4 (12/15/2008)  LDL:70 (05/26/2009), DEL (12/15/2008)  Chol:188 (02/24/2009), 243 (12/15/2008)  Trig:308.0 (02/24/2009), 396 (12/15/2008)  Problem # 3:  COPD, MILD (ICD-496) Counseled on smoking cessation options such as nicotine lozanges. She will try these. Continue current medicaitons.    The following medications were removed from the medication list:    Atrovent Hfa 17 Mcg/act Aers (Ipratropium bromide hfa) .[name]... 2 puff inhaled every 6 hours as needed for wheeze/shortness of breath Her updated medication list for this problem includes:    Advair Diskus 250-50 Mcg/dose Misc (Fluticasone-salmeterol) .[name]... 1 puff inh two times a day  Complete Medication List: 1)  Neurontin 300 Mg Caps (Gabapentin) .... Take 3 capsules by mouth at bedtime 2)  Crestor 20 Mg Tabs (Rosuvastatin calcium) .[name].. 1 tab by mouth daily 3)  Glucose Monitor of Choice  .... Check blood sugar daily dx 250.00 4)  Test Strips of Choice  .... Check blood sugar daily dx 250.00 5)  Lancets  .... Check blood sugar daily dx 250.00 6)  Advair Diskus 250-50 Mcg/dose Misc (Fluticasone-salmeterol) .[name].. 1 puff inh two times a day 7)  Cymbalta 30 Mg Cpep (Duloxetine hcl) .... Take 1 capsule by mouth two times a day 8)  Fish Oil 1000 Mg Caps (Omega-3 fatty acids) .[name].. 1 tab by mouth two times a day  Lipid Assessment/Plan:      The patient's calculated 10 year coronary heart disease risk is 20 %.  Based on NCEP/ATP III, the patient's risk factor category is "history of diabetes".  The patient's lipid goals are as follows: Total cholesterol goal is 200; LDL  cholesterol goal is 100; HDL cholesterol goal is 40; Triglyceride goal is 150.  Her LDL cholesterol goal has been met.    Patient Instructions: 1)  Please schedule a follow-up appointment in 3 months .  2)  HgBA1c prior to visit  ICD-9: 250.00 Prescriptions: CYMBALTA 30 MG CPEP (DULOXETINE HCL) Take 1 capsule by mouth two times a day  #120 x 3   Entered and Authorized by:   Eliezer Lofts MD   Signed by:   Eliezer Lofts MD on 05/29/2009   Method used:   Electronically to        [facility] (retail)       [address]       [place], [place]  [address]       Ph: [phone]       Fax: [phone]   [id number]:[id number] ADVAIR DISKUS 250-50 MCG/DOSE MISC  (FLUTICASONE-SALMETEROL) 1 puff inh two times a day  #3 x 3   Entered and Authorized by:   Eliezer Lofts MD   Signed by:   Eliezer Lofts MD on 05/29/2009   Method used:   Electronically to        [facility] (retail)       [address]       [place], [place]  [address]       Ph: [phone]       Fax: [phone]   RxIDXS:[id number]   Current Allergies (reviewed today): No known allergies

## 2010-11-06 NOTE — Assessment & Plan Note (Signed)
Summary: discuss labs/whc   Vital Signs:  Patient Profile:   65 Years Old Female Weight:      151.2 pounds Temp:     97.6 degrees F oral Pulse rate:   92 / minute Pulse rhythm:   regular BP sitting:   130 / 70  (left arm) Cuff size:   regular  Vitals Entered By: Christena Deem (July 09, 2007 9:21 AM)                 Chief Complaint:  Discuss labs.  History of Present Illness: Back on lipitor 20 mg daily  borderline diabetes ache in both feet, top and bottom x 6-8 months no weakness in legs hurts to stand, ball of foot painful no numbness, tingling no low back pain wants to hold off on nerve conduction studies  toenail fungus    Lipid Management History:      Positive NCEP/ATP III risk factors include female age 31 years old or older and HDL cholesterol less than 40.  Negative NCEP/ATP III risk factors include no history of early menopause without estrogen hormone replacement, no family history for ischemic heart disease, non-tobacco-user status, non-hypertensive, no ASHD (atherosclerotic heart disease), no prior stroke/TIA, no peripheral vascular disease, and no history of aortic aneurysm.     Current Allergies (reviewed today): No known allergies   Past Surgical History:    2005 lumpectomy   Family History:    Reviewed history and no changes required:   Risk Factors:  Tobacco use:  quit  Family History Risk Factors:    Family History of MI in females < 17 years old:  no    Family History of MI in males < 63 years old:  no    Physical Exam  General:     overweight-appearing.   Lungs:     Normal respiratory effort, chest expands symmetrically. Lungs are clear to auscultation, no crackles or wheezes. Heart:     Normal rate and regular rhythm. S1 and S2 normal without gallop, murmur, click, rub or other extra sounds. Pulses:     R and L dorsalis pedis and posterior tibial pulses are full and equal bilaterally Extremities:     No clubbing,  cyanosis, edema, or deformity noted with normal full range of motion of all joints.    Diabetes Management Exam:    Foot Exam (with socks and/or shoes not present):       Sensory-Pinprick/Light touch:          Left medial foot (L-4): normal          Left dorsal foot (L-5): normal          Left lateral foot (S-1): normal          Right medial foot (L-4): normal          Right dorsal foot (L-5): normal          Right lateral foot (S-1): normal       Sensory-Monofilament:          Left foot: normal          Right foot: normal       Inspection:          Left foot: normal          Right foot: normal       Nails:          Left foot: normal          Right foot: normal  Impression & Recommendations:  Problem # 1:  HYPERLIPIDEMIA, MIXED (ICD-272.2) Restarted lipitor and has successfully lowered LDl.  Triglycerides still high, start fenofibrate.  Rechaeck in 3 months. Encouraged exercise, weight loss, healthy eating habits.  Her updated medication list for this problem includes:    Lipitor 20 Mg Tabs (Atorvastatin calcium) .[name]... 1 by mouth once daily    Fenofibrate 160 Mg Tabs (Fenofibrate) .[name]... Take 1 tablet by mouth once a day   Problem # 2:  HYPERGLYCEMIA (ICD-790.29) Borderline DM.  Dietary control.   Labs Reviewed: HgBA1c: 5.9 (04/23/2007)   Creat: 0.7 (07/06/2007)      Problem # 3:  FOOT PAIN, BILATERAL (ICD-729.5) ? secondary to diabettic neuropathy vs. other.  Will try trial of neurontin, if no improvement consider further eval such as never conduction studies.    Complete Medication List: 1)  Effexor Xr 150 Mg Cp24 (Venlafaxine hcl) .... Take 1 capsule by mouth once a day 2)  Femara 2.5 Mg Tabs (Letrozole) .... Take 1 tablet by mouth once a day 3)  Effexor Xr 75 Mg Cp24 (Venlafaxine hcl) .... Take 1 capsule by mouth once a day 4)  Lipitor 20 Mg Tabs (Atorvastatin calcium) .[name].. 1 by mouth once daily 5)  Fenofibrate 160 Mg Tabs (Fenofibrate) .... Take 1 tablet by mouth  once a day 6)  Lamisil 250 Mg Tabs (Terbinafine hcl) .... Take 1 tablet by mouth once a day x 6weeks 7)  Neurontin 300 Mg Caps (Gabapentin) .[name].. 1 tab by mouth at bedtime, then may increase to two times a day after 1 week  Lipid Assessment/Plan:      Based on NCEP/ATP III, the patient's risk factor category is "2 or more risk factors and a calculated 10 year CAD risk of < 20%".  From this information, the patient's calculated lipid goals are as follows: Total cholesterol goal is 200; LDL cholesterol goal is 130; HDL cholesterol goal is 40; Triglyceride goal is 150.  Her LDL cholesterol goal has not been met.     Patient Instructions: 1)  Please return for a FASTING Lipid Profile, a1c, ASTALT  in 3 months. Dx 272.0/250.00 2)  Please schedule a follow-up appointment in 1 month, foot pain.    Prescriptions: NEURONTIN 300 MG  CAPS (GABAPENTIN) 1 tab by mouth at bedtime, then may increase to two times a day after 1 week  #60 x 1   Entered and Authorized by:   Eliezer Lofts MD   Signed by:   Daralene Milch on 07/09/2007   Method used:   Electronically sent to ...       [facility]       [place], [place]  [address]       Ph: [phone] or [phone]       Fax: [phone]   RxID:   [id number] LAMISIL 250 MG  TABS (TERBINAFINE HCL) Take 1 tablet by mouth once a day x 6weeks  #30 x 1   Entered and Authorized by:   Eliezer Lofts MD   Signed by:   Daralene Milch on 07/09/2007   Method used:   Electronically sent to ...       [facility]       [place], [place]  [address]       Ph: [phone] or [phone]  Fax: [phone]   RxIDEM:[id number] FENOFIBRATE 160 MG  TABS (FENOFIBRATE) Take 1 tablet by mouth once a day  #30 x 6   Entered and Authorized by:   Eliezer Lofts MD   Signed by:   Daralene Milch on 07/09/2007   Method used:   Electronically sent to ...       [facility]       [place], [place]  [address]       Ph: [phone] or [phone]       Fax: [phone]   RxID:   [id number]  ] Current Allergies (reviewed today): No known allergies

## 2010-11-06 NOTE — Assessment & Plan Note (Signed)
Summary: CRS/ WT: 151/ DX: CHESTPAIN/ DR. BEDSOLE/ SECURE HORIZONE./ W...  Nuclear Med Background Indications for Stress Test: Evaluation for Ischemia, Abnormal EKG   History: COPD  History Comments: NO DOCUMENTED CAD  Symptoms: Chest Pressure, DOE, Fatigue  Symptoms Comments: Last episode of [id number] week.   Nuclear Pre-Procedure Cardiac Risk Factors: Hypertension, NIDDM, Smoker Caffeine/Decaff Intake: None NPO After: 8:30 PM Lungs: Cler IV 0.9% NS with Angio Cath: 22g     IV Site: (R) wrist IV Started by: Eliezer Lofts EMT-P Chest Size (in) 36     Cup Size C     Height (in): 62 Weight (lb): 146 BMI: 26.80  Nuclear Med Study 1 or 2 day study:  1 day     Stress Test Type:  Stress Reading MD:  Glori Bickers, MD     Referring MD:  Eliezer Lofts, MD Resting Radionuclide:  Technetium 49m Tetrofosmin     Resting Radionuclide Dose:  10.9 mCi  Stress Radionuclide:  Technetium 70m Tetrofosmin     Stress Radionuclide Dose:  33 mCi   Stress Protocol Exercise Time (min):  4:16 min     Max HR:  137 bpm     Predicted Max HR:  Q000111Q bpm  Max Systolic BP: 99991111 mm Hg     Percent Max HR:  86.16 %     METS: 6.0 Rate Pressure Product:  25893    Stress Test Technologist:  Valetta Fuller CMA-N     Nuclear Technologist:  Charlton Amor CNMT  Rest Procedure  Myocardial perfusion imaging was performed at rest 45 minutes following the intravenous administration of Myoview Technetium 62m Tetrofosmin.  Stress Procedure  The patient exercised for 4:16.  The patient stopped due to fatigue and dyspnea with peak O2 SAT of 91%.  She denied any chest pain.  There were no diagnostic ST-T wave changes.  Myoview was injected at peak exercise and myocardial perfusion imaging was performed after a brief delay.  QPS Raw Data Images:  Normal; no motion artifact; normal heart/lung ratio. Stress Images:  There is normal uptake in all areas. Rest Images:  Normal homogeneous uptake in all areas of the  myocardium. Subtraction (SDS):  Normal Transient Ischemic Dilatation:  .98  (Normal <1.22)  Lung/Heart Ratio:  .35  (Normal <0.45)  Quantitative Gated Spect Images QGS EDV:  52 ml QGS ESV:  14 ml QGS EF:  72 % QGS cine images:  Normal  Findings Normal nuclear study      Overall Impression  Exercise Capacity: Fair exercise capacity. BP Response: Normal blood pressure response. Clinical Symptoms: Dyspnea, fatigue ECG Impression: No significant ST segment change suggestive of ischemia. Overall Impression: Normal stress nuclear study. Overall Impression Comments: O2 sats dropped to 91% with exercise suggestive of intrinsic pulmonary disease.

## 2010-11-06 NOTE — Progress Notes (Signed)
Summary: changed to proair  Phone Note From Pharmacy   Caller: [place] of Call: [facility] sent fax stating they changed pt to proair instead of ventolin due to cost, ventolin not covered on insurance.  Changed in emr. Initial call taken by: Marty Heck CMA,  Feb 23, 2010 3:34 PM  Follow-up for Phone Call        Noted.  Follow-up by: Eliezer Lofts MD,  Feb 23, 2010 5:24 PM    New/Updated Medications: PROAIR HFA 108 (90 BASE) MCG/ACT AERS (ALBUTEROL SULFATE) 2 puffs every 4-6 hours as needed for wheezing DOXYCYCLINE HYCLATE 100 MG CAPS (DOXYCYCLINE HYCLATE) Take 1 tab twice a day x 10 days Prescriptions: DOXYCYCLINE HYCLATE 100 MG CAPS (DOXYCYCLINE HYCLATE) Take 1 tab twice a day x 10 days  #20 x 0   Entered and Authorized by:   Arnette Norris MD   Signed by:   Arnette Norris MD on 02/23/2010   Method used:   Electronically to        [facility] (retail)       [address]       [place], [place]  [address]       Ph: [phone]       Fax: [phone]   RxIDCE:[id number]   Prior Medications: NEURONTIN 300 MG  CAPS (GABAPENTIN) Take 3 capsules by mouth at bedtime CRESTOR 20 MG TABS (ROSUVASTATIN CALCIUM) 1 tab by mouth daily GLUCOSE MONITOR OF CHOICE () Check blood sugar daily Dx 250.00 TEST STRIPS OF CHOICE () Check blood sugar daily Dx 250.00 LANCETS () Check blood sugar daily Dx 250.00 ADVAIR DISKUS 250-50 MCG/DOSE MISC (FLUTICASONE-SALMETEROL) 1 puff inh two times a day CYMBALTA 30 MG CPEP (DULOXETINE HCL) Take 1 capsule by mouth two times a day FISH OIL 1000 MG CAPS (OMEGA-3 FATTY ACIDS) 1 tab by mouth two times a day Current Allergies: No known allergies

## 2010-11-06 NOTE — Letter (Signed)
Summary: M&M CT Imaging Options Form/[facility]  M&M CT Imaging Options Form/[facility]   Imported By: Edmonia James 09/26/2009 08:02:41  _____________________________________________________________________  External Attachment:    Type:   Image     Comment:   External Document

## 2010-11-06 NOTE — Assessment & Plan Note (Signed)
Summary: discuss lab results/kmo   Vital Signs:  Patient Profile:   65 Years Old Female Weight:      154 pounds Temp:     98.3 degrees F oral Pulse rate:   89 / minute BP sitting:   142 / 84  (right arm) Cuff size:   regular  Vitals Entered By: Glenna Durand (April 30, 2007 2:33 PM)               Chief Complaint:  discuss lab results.  History of Present Illness: Here to discuss fasting labs done 04/23/07: total cholesterol---236 LDL---152.3 Hdl----31.6 VLDL--80 triglycerides --398 glucose----127 HgbA1C---5.9  Feet still hurt and feels bad all the time.  Current Allergies (reviewed today): No known allergies  Updated/Current Medications (including changes made in today's visit):  EFFEXOR XR 150 MG CP24 (VENLAFAXINE HCL) Take 1 capsule by mouth once a day FEMARA 2.5 MG TABS (LETROZOLE) Take 1 tablet by mouth once a day EFFEXOR XR 75 MG CP24 (VENLAFAXINE HCL) Take 1 capsule by mouth once a day      Review of Systems      See HPI   Physical Exam  General:     alert, well-developed, well-nourished, and well-hydrated.  smells of tobacco smoke Head:     normocephalic.   Lungs:     normal respiratory effort.   Neurologic:     alert & oriented X3 and gait normal.   Skin:     color normal.   Psych:     normally interactive and good eye contact.      Impression & Recommendations:  Problem # 1:  HYPERLIPIDEMIA, MIXED (ICD-272.2) Assessment: New handouts on lipids, low fat diet and low triglyceride diet--reviewed reviewed labs with her  Problem # 2:  HYPERGLYCEMIA (ICD-790.29) Assessment: New discussed low simple CHO diet reviewed labs with her wants to tryto improve diet and exercise for both of these problems before meds labs in 2 mo--if not down, will need meds discussed risks/benefits   Patient Instructions: 1)  Schedule fasting labs in 2 mo:  lipids---272.2 2)                                                    bmet---v70.0

## 2010-11-06 NOTE — Progress Notes (Signed)
Summary: forms for diabetic supplies, heating pad/ wants albuterol  Phone Note From Pharmacy Call back at Home Phone [phone]   Caller: [place] [facility] Summary of Call: Forms for diabetic supplies and heating pad are on your desk.  I checked with the pt and she does want these.  Also, pt is asking to go back on albuterol inhaler.  She says she  has been doing a lot of walking lately and feels that she needs it.  Uses [facility]. Initial call taken by: Marty Heck CMA,  January 03, 2010 9:35 AM  Follow-up for Phone Call        Will place forms in outbox.  Follow-up by: Eliezer Lofts MD,  January 03, 2010 1:02 PM  Additional Follow-up for Phone Call Additional follow up Details #1::        Patient advised.Cathlean Cower CMA  Additional Follow-up by: Zenda Alpers CMA Deborra Medina),  January 03, 2010 2:28 PM    New/Updated Medications: PROAIR HFA 108 (90 BASE) MCG/ACT AERS (ALBUTEROL SULFATE) 2 puff inh as needed  every 4-6 hours for wheeze  Prescriptions: PROAIR HFA 108 (90 BASE) MCG/ACT AERS (ALBUTEROL SULFATE) 2 puff inh as needed  every 4-6 hours for wheeze  #1 x 3   Entered and Authorized by:   Eliezer Lofts MD   Signed by:   Eliezer Lofts MD on 01/03/2010   Method used:   Electronically to        [facility] (retail)       [address]       [place], [place]  [address]       Ph: [phone]       Fax: [phone]   RxIDKG:[id number]

## 2010-11-06 NOTE — Progress Notes (Signed)
Summary: Chantix  Phone Note Refill Request Message from:  Fax from Pharmacy on January 11, 2008 10:57 AM  Refills Requested: Medication #1:  CHANTIX 1 MG  TABS Take one tablet by mouth two times a day.Katherina Right   Method Requested: Fax to [facility] Initial call taken by: Christena Deem,  January 11, 2008 10:58 AM      Prescriptions: CHANTIX 1 MG  TABS (VARENICLINE TARTRATE) Take one tablet by mouth two times a day.  #1 pack x 2   Entered and Authorized by:   Eliezer Lofts MD   Signed by:   Eliezer Lofts MD on 01/11/2008   Method used:   Handwritten   [id number]:[id number]

## 2010-11-06 NOTE — Progress Notes (Signed)
Summary: increase med  Phone Note Call from Patient Call back at Home Phone [phone] Message from:  Patient on July 09, 2007 3:19 PM  [facility]  Initial call taken by: Daralene Milch,  July 09, 2007 3:19 PM Caller: Patient Call For: bedsole Summary of Call: pt wants to have effexor increased, she is on a total of 225mg  but says she can't feel a difference. Initial call taken by: Daralene Milch,  July 09, 2007 3:17 PM  Follow-up for Phone Call        MAx Effexor is 225 mg.  Suggest appointment to discuss different medication or pshychiatry referral. Follow-up by: Eliezer Lofts MD,  July 10, 2007 8:29 AM  Additional Follow-up for Phone Call Additional follow up Details #1::        [facility] to call for an appointment. Additional Follow-up by: Christena Deem,  July 10, 2007 4:49 PM

## 2010-11-06 NOTE — Progress Notes (Signed)
Summary: refill request for neurontin  Phone Note Refill Request Message from:  Fax from Pharmacy  Refills Requested: Medication #1:  NEURONTIN 300 MG  CAPS Take 3 capsules by mouth at bedtime   Last Refilled: 12/14/2009 Faxed request from [facility], [place].  Initial call taken by: Marty Heck CMA,  March 20, 2010 12:26 PM    Prescriptions: NEURONTIN 300 MG  CAPS (GABAPENTIN) Take 3 capsules by mouth at bedtime  #270 x 3   Entered and Authorized by:   Eliezer Lofts MD   Signed by:   Eliezer Lofts MD on 03/20/2010   Method used:   Electronically to        [facility] (retail)       [address] [address]       [place], [place]  [address]       Ph: [phone]       Fax: [phone]   RxIDTD:[id number]

## 2010-11-06 NOTE — Progress Notes (Signed)
Summary: wants to change from proair  Phone Note From Pharmacy   Caller: [facility] of Call: Pt is using proair inhaler and this is causing her to have throat irritation.  [facility] is asking it they can change her to ventolin or other inhaler. Initial call taken by: Marty Heck CMA,  January 19, 2010 8:53 AM  Follow-up for Phone Call        okay to cahnge to ventolin....send in rx #1 3 RF.  MAke appt if throat irritation not improving for eval for thrush.  Follow-up by: Eliezer Lofts MD,  January 19, 2010 2:10 PM  Additional Follow-up for Phone Call Additional follow up Details #1::        Called to [facility], changed in emr.  Additional Follow-up by: Marty Heck CMA,  January 19, 2010 2:16 PM    New/Updated Medications: VENTOLIN HFA 108 (90 BASE) MCG/ACT AERS (ALBUTEROL SULFATE) 2 puffs every 4-8 hours as needed for wheezing  Prior Medications: NEURONTIN 300 MG  CAPS (GABAPENTIN) Take 3 capsules by mouth at bedtime CRESTOR 20 MG TABS (ROSUVASTATIN CALCIUM) 1 tab by mouth daily GLUCOSE MONITOR OF CHOICE () Check blood sugar daily Dx 250.00 TEST STRIPS OF CHOICE () Check blood sugar daily Dx 250.00 LANCETS () Check blood sugar daily Dx 250.00 ADVAIR DISKUS 250-50 MCG/DOSE MISC (FLUTICASONE-SALMETEROL) 1 puff inh two times a day CYMBALTA 30 MG CPEP (DULOXETINE HCL) Take 1 capsule by mouth two times a day FISH OIL 1000 MG CAPS (OMEGA-3 FATTY ACIDS) 1 tab by mouth two times a day Current Allergies: No known allergies

## 2010-11-06 NOTE — Miscellaneous (Signed)
  Clinical Lists Changes  Medications: Changed medication from LIPITOR 40 MG  TABS (ATORVASTATIN CALCIUM) Take 1 tablet by mouth once a day to LIPITOR 80 MG TABS (ATORVASTATIN CALCIUM) Take 1 tablet by mouth once a day

## 2010-11-06 NOTE — Medication Information (Signed)
Summary: [facility]  [facility]   Imported By: Edmonia James 01/09/2010 11:06:22  _____________________________________________________________________  External Attachment:    Type:   Image     Comment:   External Document

## 2010-11-06 NOTE — Progress Notes (Signed)
Summary: cymbalta rx  Phone Note Call from Patient Call back at Home Phone [phone] Call back at leave message on home voicemail   Caller: Patient Call For: Cindy Burns Summary of Call: pt needs rx for cymbalta called to [facility], she is currently on samples. Initial call taken by: Daralene Milch,  August 31, 2007 10:08 AM  Follow-up for Phone Call        Notify patient that the below prescription has been sent to the pharmacy electronically.   Message left on voicemail  per pt's request. Follow-up by: Emelia Salisbury,  August 31, 2007 12:28 PM      Prescriptions: CYMBALTA 30 MG  CPEP (DULOXETINE HCL) Take 1 tablet by mouth once a day  #30 x 5   Entered and Authorized by:   Eliezer Lofts MD   Signed by:   Eliezer Lofts MD on 08/31/2007   Method used:   Electronically sent to ...       [facility]       [place], [place]  [address]       Ph: [phone] or [phone]       Fax: [phone]   RxID:   [id number]

## 2010-11-06 NOTE — Miscellaneous (Signed)
Summary: Orders Update pft charges  Clinical Lists Changes  Orders: Added new Service order of Carbon Monoxide diffusing w/capacity (94720) - Signed Added new Service order of Lung Volumes (94240) - Signed Added new Service order of Spirometry (Pre & Post) (94060) - Signed 

## 2010-11-06 NOTE — Letter (Signed)
Summary: Results Follow up Letter  [facility]  [address] [place], [place] [address]   Phone: [phone]  Fax: [phone]    03/03/2009 MRN: [id number]  [address] [place], [place]  [address]  Dear Ms. Theodoro Clock,  The following are the results of your recent test(s):  Test         Result    Pap Smear:        Normal _____  Not Normal _____ Comments: ______________________________________________________ Cholesterol: LDL(Bad cholesterol):         Your goal is less than:         HDL (Good cholesterol):       Your goal is more than: Comments:  ______________________________________________________ Mammogram:        Normal __x___  Not Normal _____ Comments:   Your mammogram report was normal. Please make sure to repeat in one year. Enclosed is a copy of your report.  ___________________________________________________________________ Hemoccult:        Normal _____  Not normal _______ Comments:    _____________________________________________________________________ Other Tests:    We routinely do not discuss normal results over the telephone.  If you desire a copy of the results, or you have any questions about this information we can discuss them at your next office visit.   Sincerely,

## 2011-01-18 ENCOUNTER — Other Ambulatory Visit: Payer: Self-pay | Admitting: *Deleted

## 2011-01-18 MED ORDER — ROSUVASTATIN CALCIUM 20 MG PO TABS: 20.0000 mg | ORAL_TABLET | Freq: Every day | ORAL | Status: DC

## 2011-02-22 NOTE — Assessment & Plan Note (Signed)
[facility] OFFICE NOTE   Cindy, Burns                       MRN:          [id number]  DATE:12/02/2006                            DOB:          1946-01-20    CHIEF COMPLAINT:  A 65 year old white female here to establish new  doctor.   HISTORY OF PRESENT ILLNESS:  Cindy Burns is moving her doctors to be  closer to home from the [facility] with Dr. Lavonia Drafts.  She comes to clinic  today with the following concerns:  1. Depression and insomnia:  She states that she has been on Effexor      since 2005, every since she was diagnosed with breast cancer.  She      was previously on a higher dose at 225 and was having more      improvement with her depression and sleeping concerns.  She was      given a medication by her previous primary care doctor for      depression that she did not use because it was said that it was an      antidepressant.  She was concerned about being on 2      antidepressants.  She states that she just does not have energy and      has frequent moody spells.  2. Sharp abdominal pain, acute:  She had an episode for about an hour      1 week ago of sharp bilateral lower quadrant abdominal pain.  She      felt the need for a bowel movement.  She had difficulty having a      bowel movement and so took a stool softener.  Ever since that point      in time she has had regular bowel movements and no further pain.      She denies any pain with urinating, no constipation, diarrhea, no      blood in her stool.  3. Tobacco cessation:  She has tried to quit smoking in the past but      has a 40 pack year history.  She has tried Chantix in the past and      was initially successful but started back because she thought what      is the point in quitting.  4. Lesion on face, chronic:  She has had a gradually increasing in      size lesion on her forehead with a warty, pale appearance over the      past  6 to 7 years.  She denies any itching, redness.  She states      that she showed it to her surgeon and he felt it was benign.   REVIEW OF SYSTEMS:  No headache, no syncope.  Wears glasses.  No hearing  problems, no shortness of breath, occasional dyspnea on exertion.  No  nausea, diarrhea or constipation and no rectal bleeding.   PAST MEDICAL HISTORY:  1. Osteoarthritis.  2. In 2005 breast cancer.  3. Depression.  4. COPD.  5. Allergic rhinitis.  6. Tobacco abuse.  7. Hypercholesterolemia.  8. In 2005 DVT in left upper arm after lumpectomy.  9  Gallstones.  1. Stress incontinence.   HOSPITALIZATIONS, SURGERIES, PROCEDURES:  1. In 2005 lumpectomy and lymph node dissection on left.  2. In 1980 exploratory laparoscopy to evaluate for fertility.  3. Mammogram September 2007, negative.  The patient gets these done q.      6 months.  4. Pap smear 2005 - negative.  5. Colonoscopy 2007 - negative.   ALLERGIES:  None.   MEDICATIONS:  1. Effexor XR 150 mg daily.  2. Femara 2.5 mg  daily.   SOCIAL HISTORY:  A 40 pack-year history and currently smokes 30  cigarettes per day.  No alcohol, no drug use.  She works in [employer] (medical).  She  is single and has 1 healthy daughter.  She walks a lot at work and does  some weights.  She does not eat healthfully and has fast food 3 or 4  times per week.  She does try to get some salads and vegetables.   FAMILY HISTORY:  Father deceased at age 95 with mini stroke, dementia  and hypertension.  Mother alive at age 61 with Alzheimer's.  There is no  family history of heart attack before age 8.  She has 2 brothers, one  with emphysema, one who passed away with emphysema and pneumonia.  There  is no family history of diabetes.  There is no family history of any  type of cancer.   PHYSICAL EXAMINATION:  VITAL SIGNS:  Height 63 inches.  Weight 154,  making BMI 28.  Blood pressure 134/82, pulse 80, temperature 98.1.  GENERAL:  Obese appearing female in no  apparent distress.  HEENT:  PERRLA, extraocular muscles intact.  Oropharynx clear.  Tympanic  membranes clear.  Nares clear.  No thyromegaly.  No lymphadenopathy  supraclavicular, cervical.  SKIN:  Warty, stuck on appearing lesion on upper forehead, approximately  1 cm in diameter, uniform in color and pale, likely seborrheic  keratoses.  CARDIOVASCULAR:  Regular rate and rhythm. No murmurs, rubs or gallops.  PULMONARY:  Clear to auscultation bilaterally. No wheezes, rales or  rhonchi.  ABDOMEN:  Soft, nontender, normoactive bowel sounds.  No  hepatosplenomegaly.  MUSCULOSKELETAL:  Strength 5 out of 5 in upper and lower extremities.  NEUROLOGICALLY:  Alert and oriented x3.  Cranial nerves II through XII  grossly intact.  PSYCH:  Appropriate affect  No suicidal or homicidal ideation.  No  hallucinations.   ASSESSMENT AND PLAN:  1. Depression and insomnia, chronic:  We will increase her Effexor to      225 mg daily, which she will take by taking 150 mg of XR with a 75      mg of XR daily.  I will get records from her previous doctor to      determine what was done previously for depression as well as      insomnia.  She was given information on behavioral changes to make      for insomnia.  We may need a medication to assist with sleep if      this does not improve with the elevation of Effexor after 3 to 4      weeks.  2. Abdominal pain, acute:  This is resolved and is most likely      secondary to gastric constipation.  I encouraged her to improve her      diet and increase fiber.  3. Tobacco cessation:  She was given a prescription for Chantix which      she will retry.  She will also use the quit smoking hotline to help      with smoking cessation.  4. Lesion on face:  This is most likely seborrheic dermatosis.  The      patient is requesting cryotherapy at this point in time.  She will     return for an appointment to freeze this lesion.  We did discuss      the fact that we  will not be able to definitely identify this      lesion before treating it with cryotherapy.  It does not have any      characteristics or symptoms that suggest melanoma or other skin      cancer.  The patient was in full understanding and agreement.  5. Breast cancer:  She no longer goes to the [facility]cology [facility].  She      states she was somewhat unhappy with her care.  She is unsure as to      why they did not give her any refills of Femara and she needs some      at this time.  She states that she thought she was supposed to be      on it for 10 years total, until 2010.  I will get records from the      [facility] to determine how long she needs to continue Femara      and if she needs follow up with them.  In the meantime I will      refill it for 30 days.  6. Prevention:  She is up to date with prevention except for a Pap      smear is due.  She is also due for her q. 6 month mammogram.  She      is unsure as to when tetanus was last given.  See above for      recommendations on diet and exercise.  I will obtain records to      determine when cholesterol was last obtained and this may be due at      this point in time.     Cindy Lofts, MD  Electronically Signed    AB/MedQ  DD: 12/02/2006  DT: 12/02/2006  Job #: [id number]

## 2011-05-13 ENCOUNTER — Other Ambulatory Visit: Payer: Self-pay | Admitting: *Deleted

## 2011-05-13 MED ORDER — IPRATROPIUM BROMIDE HFA 17 MCG/ACT IN AERS: 2.0000 | INHALATION_SPRAY | Freq: Four times a day (QID) | RESPIRATORY_TRACT | Status: DC

## 2011-05-13 NOTE — Telephone Encounter (Signed)
PATIENT NOT SEEN SINCE 03-02-2010

## 2011-05-13 NOTE — Telephone Encounter (Signed)
Ok to refill, #1  F/u with Dr. Diona Browner please

## 2011-10-08 HISTORY — PX: CHOLECYSTECTOMY: SHX55

## 2012-01-02 ENCOUNTER — Ambulatory Visit: Payer: Self-pay | Admitting: Family Medicine

## 2012-01-10 ENCOUNTER — Ambulatory Visit: Payer: Self-pay | Admitting: Emergency Medicine

## 2012-01-10 LAB — HEPATIC FUNCTION PANEL A (ARMC)
Albumin: 3.9 g/dL (ref 3.4–5.0)
Alkaline Phosphatase: 96 U/L (ref 50–136)
Bilirubin, Direct: <0.1 mg/dL (ref 0.00–0.20)
Bilirubin,Total: 0.3 mg/dL (ref 0.2–1.0)
SGOT(AST): 27 U/L (ref 15–37)
SGPT (ALT): 27 U/L
Total Protein: 7.4 g/dL (ref 6.4–8.2)

## 2012-01-16 ENCOUNTER — Ambulatory Visit: Payer: Self-pay | Admitting: Emergency Medicine

## 2012-01-20 LAB — PATHOLOGY REPORT

## 2013-08-12 ENCOUNTER — Ambulatory Visit (INDEPENDENT_AMBULATORY_CARE_PROVIDER_SITE_OTHER): Payer: 59 | Admitting: Family Medicine

## 2013-08-12 ENCOUNTER — Encounter: Payer: Self-pay | Admitting: Family Medicine

## 2013-08-12 VITALS — BP 130/74 | HR 77 | Temp 97.9°F | Ht 62.0 in | Wt 138.0 lb

## 2013-08-12 DIAGNOSIS — Z23 Encounter for immunization: Secondary | ICD-10-CM

## 2013-08-12 DIAGNOSIS — C50919 Malignant neoplasm of unspecified site of unspecified female breast: Secondary | ICD-10-CM

## 2013-08-12 DIAGNOSIS — F332 Major depressive disorder, recurrent severe without psychotic features: Secondary | ICD-10-CM

## 2013-08-12 DIAGNOSIS — G909 Disorder of the autonomic nervous system, unspecified: Secondary | ICD-10-CM

## 2013-08-12 DIAGNOSIS — E1149 Type 2 diabetes mellitus with other diabetic neurological complication: Secondary | ICD-10-CM

## 2013-08-12 DIAGNOSIS — B309 Viral conjunctivitis, unspecified: Secondary | ICD-10-CM

## 2013-08-12 DIAGNOSIS — F17200 Nicotine dependence, unspecified, uncomplicated: Secondary | ICD-10-CM

## 2013-08-12 DIAGNOSIS — J984 Other disorders of lung: Secondary | ICD-10-CM

## 2013-08-12 DIAGNOSIS — E782 Mixed hyperlipidemia: Secondary | ICD-10-CM

## 2013-08-12 DIAGNOSIS — E1143 Type 2 diabetes mellitus with diabetic autonomic (poly)neuropathy: Secondary | ICD-10-CM | POA: Insufficient documentation

## 2013-08-12 DIAGNOSIS — J449 Chronic obstructive pulmonary disease, unspecified: Secondary | ICD-10-CM

## 2013-08-12 DIAGNOSIS — J4489 Other specified chronic obstructive pulmonary disease: Secondary | ICD-10-CM

## 2013-08-12 LAB — COMPREHENSIVE METABOLIC PANEL
ALT: 22 U/L (ref 0–35)
AST: 28 U/L (ref 0–37)
Albumin: 4.1 g/dL (ref 3.5–5.2)
Alkaline Phosphatase: 71 U/L (ref 39–117)
BUN: 6 mg/dL (ref 6–23)
CO2: 26 mEq/L (ref 19–32)
Calcium: 9 mg/dL (ref 8.4–10.5)
Chloride: 104 mEq/L (ref 96–112)
Creatinine, Ser: 0.7 mg/dL (ref 0.4–1.2)
GFR: 89.25 mL/min (ref >60.00)
Glucose, Bld: 121 mg/dL — ABNORMAL HIGH (ref 70–99)
Potassium: 4.5 mEq/L (ref 3.5–5.1)
Sodium: 137 mEq/L (ref 135–145)
Total Bilirubin: 0.6 mg/dL (ref 0.3–1.2)
Total Protein: 7.1 g/dL (ref 6.0–8.3)

## 2013-08-12 LAB — LIPID PANEL
Cholesterol: 235 mg/dL — ABNORMAL HIGH (ref 0–200)
HDL: 36.4 mg/dL — ABNORMAL LOW (ref >39.00)
Total CHOL/HDL Ratio: 6
Triglycerides: 273 mg/dL — ABNORMAL HIGH (ref 0.0–149.0)
VLDL: 54.6 mg/dL — ABNORMAL HIGH (ref 0.0–40.0)

## 2013-08-12 LAB — LDL CHOLESTEROL, DIRECT: Direct LDL: 156.3 mg/dL

## 2013-08-12 LAB — MICROALBUMIN / CREATININE URINE RATIO
Creatinine,U: 60 mg/dL
Microalb Creat Ratio: 0.3 mg/g (ref 0.0–30.0)
Microalb, Ur: 0.2 mg/dL (ref 0.0–1.9)

## 2013-08-12 LAB — HEMOGLOBIN A1C: Hgb A1c MFr Bld: 6.3 % (ref 4.6–6.5)

## 2013-08-12 MED ORDER — VARENICLINE TARTRATE 1 MG PO TABS: 1.0000 mg | ORAL_TABLET | Freq: Two times a day (BID) | ORAL | Status: DC

## 2013-08-12 MED ORDER — VARENICLINE TARTRATE 0.5 MG X 11 & 1 MG X 42 PO MISC: ORAL | Status: DC

## 2013-08-12 MED ORDER — BUPROPION HCL ER (XL) 150 MG PO TB24: 150.0000 mg | ORAL_TABLET | Freq: Every day | ORAL | Status: DC

## 2013-08-12 NOTE — Patient Instructions (Addendum)
Stop at lab on your way out. Schedule appt for medicare wellness in next few weeks. Quit smoking!  Stop venlafaxine and start wellbutrin daily. Start lubricating drops for eye viral infection.. Call if not improving in 2 weeks.

## 2013-08-12 NOTE — Assessment & Plan Note (Signed)
Symptomatic care 

## 2013-08-12 NOTE — Progress Notes (Signed)
Pre-visit discussion using our clinic review tool. No additional management support is needed unless otherwise documented below in the visit note.  

## 2013-08-12 NOTE — Progress Notes (Signed)
Subjective:    Patient ID: Cindy Burns, female    DOB: [birth year], 67 y.o.   MRN: [id number]  HPI  67 year old female presents to re-establish care.   Since last OV here she has been seeing [facility].  Last OV in June, for follow up, no changes made at that time. Has not had a CPX in quite sometime.  Hx of breast cancer dx in 2005 . ?2005. Last mammogram in  Early 2013.  Elevated Cholesterol:  Last labs done in 6/2014on crestor 40 mg.  Inadequately controlled. Using medications without problems:None Muscle aches: None Diet compliance:Moderate Exercise: not currently, used to walk. Other complaints:  Diabetes: inadequately controlled .. last  A1C 8.5,  Started on metformin 500 mg twice daily in June.. Has diarrhea SE.  She does not take it regualrly.  Has not ever tried any other meds Using medications without difficulties: Hypoglycemic episodes:None Hyperglycemic episodes:Yes Feet problems:neuropathy, no ulcers Blood Sugars averaging: checking twice a day,  FBS 140-150, afternoon 150-245 eye exam within last year:  COPD: On spiriva and uses atrovent prn. Last PFTs per pt within last year... Told she had breathing of a 67 year old... Started on spiriva at that time from advair... Helps some.  Depression, moderate control on venlafaxine... Wishes to change to wellbutrin for smoking cessation. She feels like it is not working as well as when it first started.  She has been having right eye discharge, redness clear ongoing x 1 wek. Some itchy, no pain. No foreign body sensation. Has been using [employer] lubricating drops.      Review of Systems  Constitutional: Negative for fever and fatigue.  HENT: Negative for ear pain.   Eyes: Negative for pain.  Respiratory: Positive for cough and shortness of breath. Negative for chest tightness.        Chronic  Cardiovascular: Negative for chest pain, palpitations and leg swelling.  Gastrointestinal: Negative for abdominal  pain.  Genitourinary: Negative for dysuria.       Objective:   Physical Exam  Constitutional: She is oriented to person, place, and time. Vital signs are normal. She appears well-developed and well-nourished. She is cooperative.  Non-toxic appearance. She does not appear ill. No distress.  Smells of smoke, yellowed fingernails from nicotine  HENT:  Head: Normocephalic.  Right Ear: Hearing, tympanic membrane, external ear and ear canal normal. Tympanic membrane is not erythematous, not retracted and not bulging.  Left Ear: Hearing, tympanic membrane, external ear and ear canal normal. Tympanic membrane is not erythematous, not retracted and not bulging.  Nose: No mucosal edema or rhinorrhea. Right sinus exhibits no maxillary sinus tenderness and no frontal sinus tenderness. Left sinus exhibits no maxillary sinus tenderness and no frontal sinus tenderness.  Mouth/Throat: Uvula is midline, oropharynx is clear and moist and mucous membranes are normal.  Eyes: EOM are normal. Pupils are equal, round, and reactive to light. Lids are everted and swept, no foreign bodies found. Right eye exhibits discharge. Right eye exhibits no exudate. No foreign body present in the right eye. Right conjunctiva is injected. Right conjunctiva has no hemorrhage.  Neck: Trachea normal and normal range of motion. Neck supple. Carotid bruit is not present. No mass and no thyromegaly present.  Cardiovascular: Normal rate, regular rhythm, S1 normal, S2 normal, normal heart sounds, intact distal pulses and normal pulses.  Exam reveals no gallop and no friction rub.   No murmur heard. Pulmonary/Chest: Effort normal and breath sounds normal. Not tachypneic. No respiratory  distress. She has no decreased breath sounds. She has no wheezes. She has no rhonchi. She has no rales.  Abdominal: Soft. Normal appearance and bowel sounds are normal. There is no tenderness.  Neurological: She is alert and oriented to person, place, and  time.  Skin: Skin is warm, dry and intact. No rash noted.  Psychiatric: Her speech is normal and behavior is normal. Judgment and thought content normal. Her mood appears not anxious. Cognition and memory are normal. She does not exhibit a depressed mood.   Diabetic foot exam: Normal inspection No skin breakdown No calluses  Normal DP pulses Normal sensation to light touch and monofilament Nails thickened but maintained well        Assessment & Plan:

## 2013-08-12 NOTE — Assessment & Plan Note (Signed)
She is interested in quitting smoking and would like to change her antidepressant to wellbutrin to see if this helps with craving.

## 2013-08-12 NOTE — Assessment & Plan Note (Signed)
Moderate control on gabapentin 400 mg at bedtime. Had SE at higher dose.

## 2013-08-12 NOTE — Addendum Note (Signed)
Addended by: Daralene Milch C on: 08/12/2013 10:26 AM   Modules accepted: Orders

## 2013-08-12 NOTE — Assessment & Plan Note (Signed)
Stop venlafaxine and change to wellbutrin.

## 2013-09-16 ENCOUNTER — Telehealth: Payer: Self-pay | Admitting: *Deleted

## 2013-09-16 NOTE — Telephone Encounter (Signed)
Received fax from [facility] for Diabetic Testing Supplies.  I did verify with Ms. Measel that she did want to order supplies from this company.  Placed in Dr. Rometta Emery in box for signature.

## 2013-09-23 ENCOUNTER — Ambulatory Visit (INDEPENDENT_AMBULATORY_CARE_PROVIDER_SITE_OTHER): Payer: 59 | Admitting: Family Medicine

## 2013-09-23 ENCOUNTER — Encounter: Payer: Self-pay | Admitting: Family Medicine

## 2013-09-23 VITALS — BP 118/72 | HR 81 | Temp 98.0°F | Ht 62.0 in | Wt 140.0 lb

## 2013-09-23 DIAGNOSIS — R197 Diarrhea, unspecified: Secondary | ICD-10-CM | POA: Insufficient documentation

## 2013-09-23 DIAGNOSIS — F332 Major depressive disorder, recurrent severe without psychotic features: Secondary | ICD-10-CM

## 2013-09-23 DIAGNOSIS — K529 Noninfective gastroenteritis and colitis, unspecified: Secondary | ICD-10-CM

## 2013-09-23 DIAGNOSIS — R300 Dysuria: Secondary | ICD-10-CM

## 2013-09-23 DIAGNOSIS — E1149 Type 2 diabetes mellitus with other diabetic neurological complication: Secondary | ICD-10-CM

## 2013-09-23 DIAGNOSIS — E782 Mixed hyperlipidemia: Secondary | ICD-10-CM

## 2013-09-23 LAB — POCT URINALYSIS DIPSTICK
Bilirubin, UA: NEGATIVE
Blood, UA: NEGATIVE
Glucose, UA: NEGATIVE
Ketones, UA: NEGATIVE
Nitrite, UA: POSITIVE
Protein, UA: NEGATIVE
Spec Grav, UA: 1.01
Urobilinogen, UA: 0.2
pH, UA: 6

## 2013-09-23 NOTE — Patient Instructions (Addendum)
Restart wellbutrin XL at 150 mg daily.  Call in 1 week if not tolerating wellbutrin. Try to quit smoking. Restart metformin twice daily. Get Back on track with exercise and continue healthy eating. Follow up in 1 month. Follow Up DM, chol   In 3 months with labs prior.  Stop at lab for stool testing

## 2013-09-23 NOTE — Assessment & Plan Note (Signed)
Poor control on max crestor.. Was better in past with exercise. She will get back to exercise. Recheck in 3 months.

## 2013-09-23 NOTE — Progress Notes (Signed)
Elevated Cholesterol: Poor control at last check.. On crestor 20 mg daily.  Lab Results  Component Value Date   CHOL 235* 08/12/2013   HDL 36.40* 08/12/2013   LDLCALC 70 05/26/2009   LDLDIRECT 156.3 08/12/2013   TRIG 273.0* 08/12/2013   CHOLHDL 6 08/12/2013    Using medications without problems:None  Muscle aches: None  Diet compliance:Moderate  Exercise: not currently, used to walk.  Other complaints:   Diabetes: Well controlled at last check. Had stopped  metformin 500 mg twice daily due to SE of diarrhea. She has noted that metformin is not cause of her diarrhea... She has even off this med in last month.  Lab Results  Component Value Date   HGBA1C 6.3 08/12/2013   Using medications without difficulties:  Hypoglycemic episodes:None  Hyperglycemic episodes:None Feet problems:neuropathy, no ulcers  Blood Sugars averaging: checking twice a day, FBS 147-150, 2 poist prandial 160s   Chronic diarrhea off and on in last year . Noted first after gallbladder surgery, but it has steadily worsened in last few weeks. She had one episode of  incontinence in last week. Has to run to restroom frequently.  Has 0-5 BMs a day.  No blood in stool.  Some abdominal pain, B lower, off and on, not related to BMs.  Colonoscopy:  nml 2006. No recent travel, no antibiotics.  Dysuria in last few week, some better now. .  Depression, 1 month ago changed venlafaxine to wellbutrin. She states she is not sure it was chantix or wellbutrin was that she went " bonkers" very irritable. She has been off all  antidepressants for 3 weeks. She states her mood is poorly controlled. Lack of motivation, frequent crying, sleeping poorly at night as cannot fall asleep, anhedonia.  Smoking cessation: Had mood SE to chantix... very irritable.  Review of Systems  Constitutional: Negative for fever and fatigue.  HENT: Negative for ear pain.  Eyes: Negative for pain.  Respiratory: No cough, no SOBNegative for chest  tightness.  Chronic  Cardiovascular: Negative for chest pain, palpitations and leg swelling.  Gastrointestinal: Negative for abdominal pain.  Genitourinary: Negative for dysuria.  Objective:   Physical Exam  Constitutional: She is oriented to person, place, and time. Vital signs are normal. She appears well-developed and well-nourished. She is cooperative. Non-toxic appearance. She does not appear ill. No distress.  Smells of smoke, yellowed fingernails from nicotine  HENT:  Head: Normocephalic.  Right Ear: Hearing, tympanic membrane, external ear and ear canal normal. Tympanic membrane is not erythematous, not retracted and not bulging.  Left Ear: Hearing, tympanic membrane, external ear and ear canal normal. Tympanic membrane is not erythematous, not retracted and not bulging.  Nose: No mucosal edema or rhinorrhea. Right sinus exhibits no maxillary sinus tenderness and no frontal sinus tenderness. Left sinus exhibits no maxillary sinus tenderness and no frontal sinus tenderness.  Mouth/Throat: Uvula is midline, oropharynx is clear and moist and mucous membranes are normal.  Eyes: EOM are normal. Pupils are equal, round, and reactive to light. Lids are everted and swept, no foreign bodies found. Right eye exhibits discharge. Right eye exhibits no exudate. No foreign body present in the right eye. Right conjunctiva is injected. Right conjunctiva has no hemorrhage.  Neck: Trachea normal and normal range of motion. Neck supple. Carotid bruit is not present. No mass and no thyromegaly present.  Cardiovascular: Normal rate, regular rhythm, S1 normal, S2 normal, normal heart sounds, intact distal pulses and normal pulses. Exam reveals no gallop and no  friction rub.  No murmur heard.  Pulmonary/Chest: Effort normal and breath sounds normal. Not tachypneic. No respiratory distress. She has no decreased breath sounds. She has no wheezes. She has no rhonchi. She has no rales.  Abdominal: Soft. Normal  appearance and bowel sounds are normal. There is no tenderness.  Neurological: She is alert and oriented to person, place, and time.  Skin: Skin is warm, dry and intact. No rash noted.  Psychiatric: Her speech is normal and behavior is normal. Judgment and thought content normal. Her mood appears not anxious. Cognition and memory are normal. She does not exhibit a depressed mood.   Diabetic foot exam:  Normal inspection  No skin breakdown  No calluses  Normal DP pulses  Normal sensation to light touch and monofilament  Nails thickened but maintained well

## 2013-09-23 NOTE — Assessment & Plan Note (Signed)
Trial of wellbutrin with out the chantix. Follow up in 1 month.

## 2013-09-23 NOTE — Assessment & Plan Note (Signed)
Restart metformin. Encouraged exercise, weight loss, healthy eating habits.

## 2013-09-23 NOTE — Progress Notes (Signed)
Pre-visit discussion using our clinic review tool. No additional management support is needed unless otherwise documented below in the visit note.  

## 2013-09-24 LAB — CBC WITH DIFFERENTIAL/PLATELET
Basophils Absolute: 0.1 10*3/uL (ref 0.0–0.1)
Basophils Relative: 0.8 % (ref 0.0–3.0)
Eosinophils Absolute: 0.1 10*3/uL (ref 0.0–0.7)
Eosinophils Relative: 1.8 % (ref 0.0–5.0)
HCT: 39.7 % (ref 36.0–46.0)
Hemoglobin: 13.4 g/dL (ref 12.0–15.0)
Lymphocytes Relative: 27.9 % (ref 12.0–46.0)
Lymphs Abs: 2.1 10*3/uL (ref 0.7–4.0)
MCHC: 33.8 g/dL (ref 30.0–36.0)
MCV: 95.9 fl (ref 78.0–100.0)
Monocytes Absolute: 0.2 10*3/uL (ref 0.1–1.0)
Monocytes Relative: 2.5 % — ABNORMAL LOW (ref 3.0–12.0)
Neutro Abs: 5.1 10*3/uL (ref 1.4–7.7)
Neutrophils Relative %: 67 % (ref 43.0–77.0)
Platelets: 212 10*3/uL (ref 150.0–400.0)
RBC: 4.14 Mil/uL (ref 3.87–5.11)
RDW: 13.5 % (ref 11.5–14.6)
WBC: 7.6 10*3/uL (ref 4.5–10.5)

## 2013-09-24 LAB — GLIA (IGA/G) + TTG IGA
Gliadin IgA: 2.7 U/mL (ref <20)
Gliadin IgG: 5.4 U/mL (ref <20)
Tissue Transglutaminase Ab, IgA: 3.6 U/mL (ref <20)

## 2013-09-24 LAB — TSH: TSH: 2.39 u[IU]/mL (ref 0.35–5.50)

## 2013-09-26 LAB — URINE CULTURE: Colony Count: >=100000

## 2013-09-27 MED ORDER — AMOXICILLIN 875 MG PO TABS: 875.0000 mg | ORAL_TABLET | Freq: Two times a day (BID) | ORAL | Status: DC

## 2013-09-29 LAB — CLOSTRIDIUM DIFFICILE EIA: CDIFTX: NEGATIVE

## 2013-10-02 LAB — STOOL CULTURE

## 2013-10-26 ENCOUNTER — Ambulatory Visit (INDEPENDENT_AMBULATORY_CARE_PROVIDER_SITE_OTHER): Payer: 59 | Admitting: Family Medicine

## 2013-10-26 ENCOUNTER — Encounter: Payer: Self-pay | Admitting: Family Medicine

## 2013-10-26 VITALS — BP 122/66 | HR 75 | Temp 97.3°F | Ht 62.0 in | Wt 138.8 lb

## 2013-10-26 DIAGNOSIS — F5104 Psychophysiologic insomnia: Secondary | ICD-10-CM

## 2013-10-26 DIAGNOSIS — F17200 Nicotine dependence, unspecified, uncomplicated: Secondary | ICD-10-CM

## 2013-10-26 DIAGNOSIS — G4700 Insomnia, unspecified: Secondary | ICD-10-CM

## 2013-10-26 DIAGNOSIS — F332 Major depressive disorder, recurrent severe without psychotic features: Secondary | ICD-10-CM

## 2013-10-26 MED ORDER — VARENICLINE TARTRATE 0.5 MG X 11 & 1 MG X 42 PO MISC: ORAL | Status: DC

## 2013-10-26 MED ORDER — TRAZODONE HCL 50 MG PO TABS: 25.0000 mg | ORAL_TABLET | Freq: Every evening | ORAL | Status: DC | PRN

## 2013-10-26 MED ORDER — VARENICLINE TARTRATE 1 MG PO TABS: 1.0000 mg | ORAL_TABLET | Freq: Two times a day (BID) | ORAL | Status: DC

## 2013-10-26 NOTE — Patient Instructions (Signed)
Continue wellbutrin at current dose. Start trazodone at bedtime for sleep.  Restart chantix for smoking cessation.

## 2013-10-26 NOTE — Assessment & Plan Note (Signed)
Will treat insomnia, with trazodone, continue wellbutrin . If not improving ,c an consider increase or change to different med.

## 2013-10-26 NOTE — Assessment & Plan Note (Signed)
Smoking cessation instruction/counseling given:  counseled patient on the dangers of tobacco use, advised patient to stop smoking, and reviewed strategies to maximize success Refilled chantix.. She feels this was not really causing her ? SE.

## 2013-10-26 NOTE — Progress Notes (Signed)
Pre-visit discussion using our clinic review tool. No additional management support is needed unless otherwise documented below in the visit note.  

## 2013-10-26 NOTE — Progress Notes (Signed)
   Subjective:    Patient ID: Cindy Burns, female    DOB: December 22, 1945, 68 y.o.   MRN: [id number]  HPI  68 year old female returns for 1 month follow up on depression At last OV she restarted wellbutrin XL at 150 mg daily.  No SE. She reports minimal benefit.   She is irritable still. Mind racing at bedtime, Trouble falling asleep.  She remains feeling very tearful, easily emotional.  She has not been able to quit.  She had stopped chantix due to irritability. Was successful on chantix in past but restarted.  She has been taking it at night.  She is having some SOB, from new wood burning stove she got for heat.  NO SI., no HI.    Review of Systems  Constitutional: Negative for fever and fatigue.  HENT: Negative for ear pain.   Eyes: Negative for pain.  Respiratory: Negative for chest tightness and shortness of breath.   Cardiovascular: Negative for chest pain, palpitations and leg swelling.  Gastrointestinal: Negative for abdominal pain.  Genitourinary: Negative for dysuria.       Objective:   Physical Exam  Constitutional: Vital signs are normal. She appears well-developed and well-nourished. She is cooperative.  Non-toxic appearance. She does not appear ill. No distress.  HENT:  Head: Normocephalic.  Right Ear: Hearing, tympanic membrane, external ear and ear canal normal. Tympanic membrane is not erythematous, not retracted and not bulging.  Left Ear: Hearing, tympanic membrane, external ear and ear canal normal. Tympanic membrane is not erythematous, not retracted and not bulging.  Nose: No mucosal edema or rhinorrhea. Right sinus exhibits no maxillary sinus tenderness and no frontal sinus tenderness. Left sinus exhibits no maxillary sinus tenderness and no frontal sinus tenderness.  Mouth/Throat: Uvula is midline, oropharynx is clear and moist and mucous membranes are normal.  Eyes: Conjunctivae, EOM and lids are normal. Pupils are equal, round, and reactive to light.  Lids are everted and swept, no foreign bodies found.  Neck: Trachea normal and normal range of motion. Neck supple. Carotid bruit is not present. No mass and no thyromegaly present.  Cardiovascular: Normal rate, regular rhythm, S1 normal, S2 normal, normal heart sounds, intact distal pulses and normal pulses.  Exam reveals no gallop and no friction rub.   No murmur heard. Pulmonary/Chest: Effort normal and breath sounds normal. Not tachypneic. No respiratory distress. She has no decreased breath sounds. She has no wheezes. She has no rhonchi. She has no rales.  Abdominal: Soft. Normal appearance and bowel sounds are normal. There is no tenderness.  Neurological: She is alert.  Skin: Skin is warm, dry and intact. No rash noted.  Psychiatric: Her speech is normal. Judgment and thought content normal. Her mood appears not anxious. She is slowed and withdrawn. Cognition and memory are normal. She exhibits a depressed mood.          Assessment & Plan:

## 2013-10-26 NOTE — Assessment & Plan Note (Signed)
Trial of trazodone. 

## 2013-11-07 ENCOUNTER — Telehealth: Payer: Self-pay | Admitting: Family Medicine

## 2013-11-07 NOTE — Telephone Encounter (Signed)
Relevant patient education assigned to patient using Emmi. ° °

## 2013-12-14 ENCOUNTER — Telehealth: Payer: Self-pay | Admitting: Family Medicine

## 2013-12-14 DIAGNOSIS — D649 Anemia, unspecified: Secondary | ICD-10-CM

## 2013-12-14 DIAGNOSIS — E1149 Type 2 diabetes mellitus with other diabetic neurological complication: Secondary | ICD-10-CM

## 2013-12-14 NOTE — Telephone Encounter (Signed)
Message copied by [name] on Tue Dec 14, 2013 10:10 PM ------      Message from: Ellamae Sia      Created: Mon Dec 13, 2013  5:13 PM      Regarding: Lab orders for Friday, 3.13.15       Lab orders for a 2 month f/u ------

## 2013-12-17 ENCOUNTER — Other Ambulatory Visit (INDEPENDENT_AMBULATORY_CARE_PROVIDER_SITE_OTHER): Payer: 59

## 2013-12-17 DIAGNOSIS — E1149 Type 2 diabetes mellitus with other diabetic neurological complication: Secondary | ICD-10-CM

## 2013-12-17 LAB — COMPREHENSIVE METABOLIC PANEL
ALT: 28 U/L (ref 0–35)
AST: 25 U/L (ref 0–37)
Albumin: 4.3 g/dL (ref 3.5–5.2)
Alkaline Phosphatase: 67 U/L (ref 39–117)
BUN: 10 mg/dL (ref 6–23)
CO2: 25 mEq/L (ref 19–32)
Calcium: 9.4 mg/dL (ref 8.4–10.5)
Chloride: 102 mEq/L (ref 96–112)
Creatinine, Ser: 0.7 mg/dL (ref 0.4–1.2)
GFR: 92.19 mL/min (ref >60.00)
Glucose, Bld: 128 mg/dL — ABNORMAL HIGH (ref 70–99)
Potassium: 5.1 mEq/L (ref 3.5–5.1)
Sodium: 137 mEq/L (ref 135–145)
Total Bilirubin: 0.7 mg/dL (ref 0.3–1.2)
Total Protein: 7.2 g/dL (ref 6.0–8.3)

## 2013-12-17 LAB — LIPID PANEL
Cholesterol: 113 mg/dL (ref 0–200)
HDL: 46.2 mg/dL (ref >39.00)
LDL Cholesterol: 26 mg/dL (ref 0–99)
Total CHOL/HDL Ratio: 2
Triglycerides: 206 mg/dL — ABNORMAL HIGH (ref 0.0–149.0)
VLDL: 41.2 mg/dL — ABNORMAL HIGH (ref 0.0–40.0)

## 2013-12-17 LAB — HEMOGLOBIN A1C: Hgb A1c MFr Bld: 7.1 % — ABNORMAL HIGH (ref 4.6–6.5)

## 2013-12-24 ENCOUNTER — Ambulatory Visit: Payer: 59 | Admitting: Family Medicine

## 2013-12-30 ENCOUNTER — Telehealth: Payer: Self-pay | Admitting: *Deleted

## 2013-12-30 NOTE — Telephone Encounter (Signed)
Please call pt with labs results. Trig high, LDL at goal, glucose high and DM worse than last check.  We did not call her because we were going to discuss at upcoming OV in 4.2015

## 2013-12-30 NOTE — Telephone Encounter (Signed)
Patient notified as instructed by telephone.  Result numbers given.

## 2013-12-30 NOTE — Telephone Encounter (Signed)
Cindy Burns left voicemail requesting results of her recent labs.  Please advise.

## 2014-01-10 ENCOUNTER — Telehealth: Payer: Self-pay | Admitting: *Deleted

## 2014-01-10 NOTE — Telephone Encounter (Signed)
Received fax from [facility] for Diabetic Testing Supplies.  Called and spoke with Cindy Burns because we had filled out Diabetic Testing Supply form from [facility] back in 09/2013.  Cindy Burns states she never received any supplies from [facility] and does wish to change to [facility].  Form completed and placed in Dr. Rometta Emery in box for signature.

## 2014-01-11 NOTE — Telephone Encounter (Signed)
Form faxed to [facility] [phone].

## 2014-01-14 ENCOUNTER — Ambulatory Visit (INDEPENDENT_AMBULATORY_CARE_PROVIDER_SITE_OTHER): Payer: 59 | Admitting: Family Medicine

## 2014-01-14 ENCOUNTER — Encounter: Payer: Self-pay | Admitting: Family Medicine

## 2014-01-14 VITALS — BP 122/60 | HR 82 | Temp 98.2°F | Ht 62.0 in | Wt 143.5 lb

## 2014-01-14 DIAGNOSIS — E782 Mixed hyperlipidemia: Secondary | ICD-10-CM

## 2014-01-14 DIAGNOSIS — E1149 Type 2 diabetes mellitus with other diabetic neurological complication: Secondary | ICD-10-CM

## 2014-01-14 DIAGNOSIS — J984 Other disorders of lung: Secondary | ICD-10-CM

## 2014-01-14 LAB — HM DIABETES FOOT EXAM

## 2014-01-14 MED ORDER — METFORMIN HCL 500 MG PO TABS: 500.0000 mg | ORAL_TABLET | Freq: Two times a day (BID) | ORAL | Status: DC

## 2014-01-14 MED ORDER — TIOTROPIUM BROMIDE MONOHYDRATE 18 MCG IN CAPS: 18.0000 ug | ORAL_CAPSULE | Freq: Every day | RESPIRATORY_TRACT | Status: DC

## 2014-01-14 MED ORDER — GABAPENTIN 400 MG PO CAPS: 800.0000 mg | ORAL_CAPSULE | Freq: Every day | ORAL | Status: DC

## 2014-01-14 NOTE — Progress Notes (Signed)
Pre visit review using our clinic review tool, if applicable. No additional management support is needed unless otherwise documented below in the visit note. 

## 2014-01-14 NOTE — Progress Notes (Signed)
68 year old female presents for follow up.  She is doing well overall. Mood is doing well.  Elevated Cholesterol: Much better control crestor 40 mg daily.  Lab Results  Component Value Date   CHOL 113 12/17/2013   HDL 46.20 12/17/2013   LDLCALC 26 12/17/2013   LDLDIRECT 156.3 08/12/2013   TRIG 206.0* 12/17/2013   CHOLHDL 2 12/17/2013  Using medications without problems:None  Muscle aches: None  Diet compliance:Moderate  Exercise: walking Other complaints:   Diabetes: Well controlled at last check. Had stopped metformin 500 mg twice daily due to SE of diarrhea.   Lab Results  Component Value Date   HGBA1C 7.1* 12/17/2013    Wt Readings from Last 3 Encounters:  01/14/14 143 lb 8 oz (65.091 kg)  10/26/13 138 lb 12 oz (62.937 kg)  09/23/13 140 lb (63.504 kg)  Using medications without difficulties:  Hypoglycemic episodes:None  Hyperglycemic episodes:None  Feet problems:neuropathy, no ulcers   Due for eye exam Blood Sugars averaging: checking twice a day,She is eating more    She is still working on quitting with chantix. She is using much less.  Review of Systems  Constitutional: Negative for fever and fatigue.  HENT: Negative for ear pain.  Eyes: Negative for pain.  Respiratory: No cough, no SOB Negative for chest tightness.  Cardiovascular: Negative for chest pain, palpitations and leg swelling.  Gastrointestinal: Negative for abdominal pain.  Genitourinary: Negative for dysuria.  Objective:   Physical Exam  Constitutional: She is oriented to person, place, and time. Vital signs are normal. She appears well-developed and well-nourished. She is cooperative. Non-toxic appearance. She does not appear ill. No distress.  Smells of smoke, yellowed fingernails from nicotine  HENT:  Head: Normocephalic.  Right Ear: Hearing, tympanic membrane, external ear and ear canal normal. Tympanic membrane is not erythematous, not retracted and not bulging.  Left Ear: Hearing, tympanic  membrane, external ear and ear canal normal. Tympanic membrane is not erythematous, not retracted and not bulging.  Nose: No mucosal edema or rhinorrhea. Right sinus exhibits no maxillary sinus tenderness and no frontal sinus tenderness. Left sinus exhibits no maxillary sinus tenderness and no frontal sinus tenderness.  Mouth/Throat: Uvula is midline, oropharynx is clear and moist and mucous membranes are normal.  Eyes: EOM are normal. Pupils are equal, round, and reactive to light.   Neck: Trachea normal and normal range of motion. Neck supple. Carotid bruit is not present. No mass and no thyromegaly present.  Cardiovascular: Normal rate, regular rhythm, S1 normal, S2 normal, normal heart sounds, intact distal pulses and normal pulses. Exam reveals no gallop and no friction rub.  No murmur heard.  Pulmonary/Chest: Effort normal and breath sounds normal. Not tachypneic. No respiratory distress. She has no decreased breath sounds. She has no wheezes. She has no rhonchi. She has no rales.  Abdominal: Soft. Normal appearance and bowel sounds are normal. There is no tenderness.  Neurological: She is alert and oriented to person, place, and time.  Skin: Skin is warm, dry and intact. No rash noted.  Psychiatric: Her speech is normal and behavior is normal. Judgment and thought content normal. Her mood appears not anxious. Cognition and memory are normal. She does not exhibit a depressed mood.   Diabetic foot exam: Normal inspection  No skin breakdown  No calluses  Normal DP pulses  Normal sensation to light touch and monofilament  Nails thickened but maintained well

## 2014-01-14 NOTE — Patient Instructions (Addendum)
Get back on track with healthy eating focusing on decreasing the sweets and bread/pasta/potatos. Follow up in 3 months for medicare well visit with fasting labs prior.   Stop at front desk to set up ct chest for eval pulm nodule.

## 2014-01-25 ENCOUNTER — Telehealth: Payer: Self-pay

## 2014-01-25 NOTE — Telephone Encounter (Signed)
Relevant patient education assigned to patient using Emmi. ° °

## 2014-01-26 NOTE — Assessment & Plan Note (Signed)
Worsened control.  Get back on track with healthy eating focusing on decreasing the sweets and bread/pasta/potatos.

## 2014-01-26 NOTE — Assessment & Plan Note (Signed)
Due for re-eval with CT.

## 2014-01-26 NOTE — Assessment & Plan Note (Signed)
Improved control on crestor 40 mg daily. Encouraged exercise, weight loss, healthy eating habits.

## 2014-02-07 ENCOUNTER — Other Ambulatory Visit: Payer: 59

## 2014-02-11 ENCOUNTER — Ambulatory Visit (INDEPENDENT_AMBULATORY_CARE_PROVIDER_SITE_OTHER)
Admission: RE | Admit: 2014-02-11 | Discharge: 2014-02-11 | Disposition: A | Discharge status: Home / Self Care | Payer: Medicare Other | Source: Ambulatory Visit | Attending: Family Medicine | Admitting: Family Medicine

## 2014-02-11 ENCOUNTER — Encounter: Payer: Self-pay | Admitting: Family Medicine

## 2014-02-11 DIAGNOSIS — J984 Other disorders of lung: Secondary | ICD-10-CM

## 2014-02-14 ENCOUNTER — Other Ambulatory Visit: Payer: Self-pay | Admitting: *Deleted

## 2014-02-14 MED ORDER — IPRATROPIUM BROMIDE HFA 17 MCG/ACT IN AERS: 2.0000 | INHALATION_SPRAY | RESPIRATORY_TRACT | Status: DC | PRN

## 2014-02-14 MED ORDER — ROSUVASTATIN CALCIUM 40 MG PO TABS: 40.0000 mg | ORAL_TABLET | Freq: Every day | ORAL | Status: DC

## 2014-04-13 ENCOUNTER — Other Ambulatory Visit: Payer: Self-pay

## 2014-04-13 LAB — HM DIABETES EYE EXAM

## 2014-04-13 MED ORDER — TIOTROPIUM BROMIDE MONOHYDRATE 18 MCG IN CAPS: 18.0000 ug | ORAL_CAPSULE | Freq: Every day | RESPIRATORY_TRACT | Status: DC

## 2014-04-13 MED ORDER — IPRATROPIUM BROMIDE HFA 17 MCG/ACT IN AERS: 2.0000 | INHALATION_SPRAY | RESPIRATORY_TRACT | Status: DC | PRN

## 2014-04-13 MED ORDER — GABAPENTIN 400 MG PO CAPS: 800.0000 mg | ORAL_CAPSULE | Freq: Every day | ORAL | Status: DC

## 2014-04-13 MED ORDER — METFORMIN HCL 500 MG PO TABS: 500.0000 mg | ORAL_TABLET | Freq: Two times a day (BID) | ORAL | Status: DC

## 2014-04-13 MED ORDER — TRAZODONE HCL 50 MG PO TABS: 25.0000 mg | ORAL_TABLET | Freq: Every evening | ORAL | Status: DC | PRN

## 2014-04-13 NOTE — Telephone Encounter (Signed)
Call pt to have her make appt for 3 month follow up as recommended. I sent in prescriptions.

## 2014-04-13 NOTE — Telephone Encounter (Signed)
[facility] left v/m; pt has compliance problem and [facility] requesting 90 day refills on gabapentin,atorvent inhaler,metformin, spiriva and trazodone. Pt would be on auto refill. Pt last seen 01/14/14 and was to f/u 3 month wellness exam. Pt does not have future appt scheduled.Please advise.

## 2014-04-14 NOTE — Telephone Encounter (Signed)
[name], can you please call and scheduled Cindy Burns for Medicare Wellness visit.

## 2014-04-14 NOTE — Telephone Encounter (Signed)
Schedule for [facility] wellness 04/22/14. Pt also scheduled acute visit for tomorrow 04/15/14.

## 2014-04-15 ENCOUNTER — Ambulatory Visit (INDEPENDENT_AMBULATORY_CARE_PROVIDER_SITE_OTHER): Payer: Medicare Other | Admitting: Family Medicine

## 2014-04-15 ENCOUNTER — Other Ambulatory Visit: Payer: 59

## 2014-04-15 ENCOUNTER — Encounter: Payer: Self-pay | Admitting: Family Medicine

## 2014-04-15 ENCOUNTER — Telehealth: Payer: Self-pay | Admitting: Family Medicine

## 2014-04-15 VITALS — BP 114/60 | HR 77 | Temp 97.8°F | Ht 62.0 in | Wt 135.5 lb

## 2014-04-15 DIAGNOSIS — F17200 Nicotine dependence, unspecified, uncomplicated: Secondary | ICD-10-CM

## 2014-04-15 DIAGNOSIS — B880 Other acariasis: Secondary | ICD-10-CM | POA: Insufficient documentation

## 2014-04-15 DIAGNOSIS — E1149 Type 2 diabetes mellitus with other diabetic neurological complication: Secondary | ICD-10-CM

## 2014-04-15 LAB — HEMOGLOBIN A1C: Hgb A1c MFr Bld: 7.2 % — ABNORMAL HIGH (ref 4.6–6.5)

## 2014-04-15 MED ORDER — TRIAMCINOLONE ACETONIDE 0.5 % EX CREA: 1.0000 "application " | TOPICAL_CREAM | Freq: Two times a day (BID) | CUTANEOUS | Status: DC

## 2014-04-15 NOTE — Progress Notes (Signed)
Pre visit review using our clinic review tool, if applicable. No additional management support is needed unless otherwise documented below in the visit note. 

## 2014-04-15 NOTE — Progress Notes (Signed)
   Subjective:    Patient ID: Cindy Burns, female    DOB: [birth year], 68 y.o.   MRN: [id number]  Rash Pertinent negatives include no eye pain, fatigue, fever or shortness of breath.    68 year old female with DM and peripheral neuropathy presents with new onset rash on  B ankles, first noted 2 weeks ago. Started as blisters. Rash is very itchy, no painful. She has tried diabetic cream for cream without much relief. Not spreading.    She is having issues with wellbutrin. She is on this for quitting smoking.  She reports she is very irritable since being on wellbutrin.  She is smoking very little now. She is tolerating chantix.   Review of Systems  Constitutional: Negative for fever and fatigue.  HENT: Negative for ear pain.   Eyes: Negative for pain.  Respiratory: Negative for chest tightness and shortness of breath.   Cardiovascular: Negative for chest pain, palpitations and leg swelling.  Gastrointestinal: Negative for abdominal pain.  Genitourinary: Negative for dysuria.  Skin: Positive for rash.  Hematological: Does not bruise/bleed easily.       Objective:   Physical Exam  Constitutional: She appears well-developed and well-nourished.  Eyes: Conjunctivae are normal. Pupils are equal, round, and reactive to light.  Neck: Normal range of motion.  Cardiovascular: Normal rate.   Skin: Skin is warm. Rash noted.  Erythematous nodules B ankles          Assessment & Plan:

## 2014-04-15 NOTE — Patient Instructions (Addendum)
Apply cream to ankles twice daily.  Decrease the Wellbutrin to 150 mg daily every other day for 1 week, then 1 tab every 2 days x 1 week, then stop. Continue chantix and quit smoking.

## 2014-04-15 NOTE — Assessment & Plan Note (Signed)
Continue chantix. D/C wellbutrin, but titrate off over time.

## 2014-04-15 NOTE — Telephone Encounter (Signed)
Pt was suppose to have scheduled [employer] wellness after labs.. Please remind her to do so.

## 2014-04-15 NOTE — Addendum Note (Signed)
Addended by: Ellamae Sia on: 04/15/2014 03:25 PM   Modules accepted: Orders

## 2014-04-15 NOTE — Telephone Encounter (Signed)
Message copied by [name] on Fri Apr 15, 2014  8:11 AM ------      Message from: Ellamae Sia      Created: Tue Apr 05, 2014  2:14 PM      Regarding: Lab orders for Friday, 7.10.15       Lab orders, no f/u appt  ------

## 2014-04-15 NOTE — Assessment & Plan Note (Signed)
Treat with topical steroid cream.

## 2014-04-22 ENCOUNTER — Encounter: Payer: Self-pay | Admitting: Family Medicine

## 2014-04-22 ENCOUNTER — Ambulatory Visit (INDEPENDENT_AMBULATORY_CARE_PROVIDER_SITE_OTHER): Payer: Medicare Other | Admitting: Family Medicine

## 2014-04-22 VITALS — BP 114/60 | HR 75 | Temp 98.1°F | Ht 62.0 in | Wt 136.8 lb

## 2014-04-22 DIAGNOSIS — E782 Mixed hyperlipidemia: Secondary | ICD-10-CM

## 2014-04-22 DIAGNOSIS — Z0000 Encounter for general adult medical examination without abnormal findings: Secondary | ICD-10-CM

## 2014-04-22 DIAGNOSIS — Z23 Encounter for immunization: Secondary | ICD-10-CM

## 2014-04-22 DIAGNOSIS — F332 Major depressive disorder, recurrent severe without psychotic features: Secondary | ICD-10-CM

## 2014-04-22 DIAGNOSIS — E2839 Other primary ovarian failure: Secondary | ICD-10-CM

## 2014-04-22 DIAGNOSIS — J449 Chronic obstructive pulmonary disease, unspecified: Secondary | ICD-10-CM

## 2014-04-22 DIAGNOSIS — E1149 Type 2 diabetes mellitus with other diabetic neurological complication: Secondary | ICD-10-CM

## 2014-04-22 LAB — HM DIABETES FOOT EXAM

## 2014-04-22 MED ORDER — GLIPIZIDE ER 10 MG PO TB24: 10.0000 mg | ORAL_TABLET | Freq: Every day | ORAL | Status: DC

## 2014-04-22 MED ORDER — VENLAFAXINE HCL ER 37.5 MG PO CP24: 37.5000 mg | ORAL_CAPSULE | Freq: Every day | ORAL | Status: DC

## 2014-04-22 NOTE — Patient Instructions (Addendum)
Keep working on exercise. Work on low carb diet, eat smaller meals, portion size. Stop soda.  Continue metformin at current dose.  Add glucotrol XL daily.  Follow blood sugar  Fasting ( goal < 120) and 2 hours after dinner (  goal< 180)  Follow up DM in 3 months with labs prior. I strongly recommend schedule mammogram. Please call to schedule if you decide to do these. Stop at front to  set up DEXA.  Look into coverage of shingles vaccine.  Start venlafaxine 37.5 mg daily x 1 week then increase to 2 tabs daily. Follow up for 30 min OV in 1 month for mood, pelvic exam, spirometry.

## 2014-04-22 NOTE — Progress Notes (Signed)
Subjective:    Patient ID: Cindy Burns, female    DOB: 14-Sep-1946, 68 y.o.   MRN: [id number]  HPI 68 year old female here for  Medicare wellness exam. I have personally reviewed the Medicare Annual Wellness questionnaire and have noted 1. The patient's medical and social history 2. Their use of alcohol, tobacco or illicit drugs 3. Their current medications and supplements 4. The patient's functional ability including ADL's, fall risks, home safety risks and hearing or visual             impairment. 5. Diet and physical activities 6. Evidence for depression or mood disorders The patients weight, height, BMI and visual acuity have been recorded in the chart I have made referrals, counseling and provided education to the patient based review of the above and I have provided the pt with a written personalized care plan for preventive services.   Elevated Cholesterol: Much better control crestor 40 mg daily.  LDL at goal < 100. Lab Results  Component Value Date   CHOL 113 12/17/2013   HDL 46.20 12/17/2013   LDLCALC 26 12/17/2013   LDLDIRECT 156.3 08/12/2013   TRIG 206.0* 12/17/2013   CHOLHDL 2 12/17/2013  Using medications without problems:None  Muscle aches: None  Diet compliance:Moderate  Exercise: walking  Other complaints:   Diabetes: Well controlled at last check. Metformin 500 mg twice daily due to SE Lab Results  Component Value Date   HGBA1C 7.2* 04/15/2014  Using medications without difficulties:  Hypoglycemic episodes:None  Hyperglycemic episodes:yes  Feet problems:neuropathy, no ulcers  Uptodate with eye exam  Blood Sugars averaging: checking twice a day FBS 225, 2 hours after meals  200 Wt Readings from Last 3 Encounters:  04/22/14 136 lb 12 oz (62.029 kg)  04/15/14 135 lb 8 oz (61.462 kg)  01/14/14 143 lb 8 oz (65.091 kg)   She has joined gym last week, her diet is moderate  COPD, inadequate control on spiriva. advair  Not tolerated due to taste.   Depression  poor control:  Weaning off wellbutrin. Effexor and zoloft helpful in past.  Review of Systems  Constitutional: Negative for fever and fatigue.  HENT: Negative for ear pain.   Eyes: Negative for pain.  Respiratory: Negative for chest tightness and shortness of breath.   Cardiovascular: Negative for chest pain, palpitations and leg swelling.  Gastrointestinal: Negative for abdominal pain.  Genitourinary: Negative for dysuria.       Objective:   Physical Exam  Constitutional: Vital signs are normal. She appears well-developed and well-nourished. She is cooperative.  Non-toxic appearance. She does not appear ill. No distress.  HENT:  Head: Normocephalic.  Right Ear: Hearing, tympanic membrane, external ear and ear canal normal.  Left Ear: Hearing, tympanic membrane, external ear and ear canal normal.  Nose: Nose normal.  Eyes: Conjunctivae, EOM and lids are normal. Pupils are equal, round, and reactive to light. Lids are everted and swept, no foreign bodies found.  Neck: Trachea normal and normal range of motion. Neck supple. Carotid bruit is not present. No mass and no thyromegaly present.  Cardiovascular: Normal rate, regular rhythm, S1 normal, S2 normal, normal heart sounds and intact distal pulses.  Exam reveals no gallop.   No murmur heard. Pulmonary/Chest: Effort normal and breath sounds normal. No respiratory distress. She has no wheezes. She has no rhonchi. She has no rales.  Abdominal: Soft. Normal appearance and bowel sounds are normal. She exhibits no distension, no fluid wave, no abdominal bruit and  no mass. There is no hepatosplenomegaly. There is no tenderness. There is no rebound, no guarding and no CVA tenderness. No hernia.  Genitourinary: Vagina normal. No breast swelling, tenderness, discharge or bleeding. There is no rash, tenderness or lesion on the right labia. There is no rash, tenderness or lesion on the left labia. Uterus is not deviated, not enlarged, not fixed and  not tender. Right adnexum displays no mass, no tenderness and no fullness. Left adnexum displays no mass, no tenderness and no fullness.  Lymphadenopathy:    She has no cervical adenopathy.    She has no axillary adenopathy.  Neurological: She is alert. She has normal strength. No cranial nerve deficit or sensory deficit.  Skin: Skin is warm, dry and intact. No rash noted.  Psychiatric: Her speech is normal and behavior is normal. Judgment normal. Her mood appears not anxious. Cognition and memory are normal. She does not exhibit a depressed mood.          Assessment & Plan:  The patient's preventative maintenance and recommended screening tests for an annual wellness exam were reviewed in full today. Brought up to date unless services declined.  Counselled on the importance of diet, exercise, and its role in overall health and mortality. The patient's FH and SH was reviewed, including their home life, tobacco status, and drug and alcohol status.   Vaccines: uptodate with td, due for PNA  ( will get) and shingles (will consider) DEXA:  nml in 2005, due  Mammo: Hx of breast cancer, s/p lumpectomy, chemo and radiation, last mammogram 2010 SHE REFUSES.  Colon: nml 2006  Repeat 2016 PAP/DVE: no pap indicated, due for DVE.  Quit smoking today!  She has 50 pack year history. Last spirometry in 2009.  Chest CT: 01/2014 IMPRESSION:  1. Stable small lung nodules. No new lung nodules.  2. Stable areas of lung scarring and paraseptal emphysema.  3. No acute findings in the lungs.  4. No evidence metastatic disease. No change from the previous CT.  5. Lung nodules should be considered benign needing no additional  followup evaluation.

## 2014-04-22 NOTE — Progress Notes (Signed)
Pre visit review using our clinic review tool, if applicable. No additional management support is needed unless otherwise documented below in the visit note. 

## 2014-05-18 ENCOUNTER — Other Ambulatory Visit: Payer: Self-pay

## 2014-05-18 MED ORDER — GLIPIZIDE ER 10 MG PO TB24: 10.0000 mg | ORAL_TABLET | Freq: Every day | ORAL | Status: DC

## 2014-05-18 MED ORDER — VENLAFAXINE HCL ER 37.5 MG PO CP24: 37.5000 mg | ORAL_CAPSULE | Freq: Every day | ORAL | Status: DC

## 2014-05-18 NOTE — Telephone Encounter (Signed)
Amy with [facility] request 90 day rx for glipizide and venlafaxine to help pt improve adherence to getting medications. AMy will d/c monthly refills on both and change glipizide to 90 x 3 and venlafaxine #90 x 0.

## 2014-05-19 NOTE — Assessment & Plan Note (Signed)
Keep working on exercise. Work on low carb diet, eat smaller meals, portion size. Stop soda.  Continue metformin at current dose.  Add glucotrol XL daily.  Follow blood sugar at home.  Follow up DM in 3 months with labs prior.

## 2014-05-19 NOTE — Assessment & Plan Note (Signed)
Start venlafaxine 37.5 mg daily x 1 week then increase to 2 tabs daily. Follow up in 1 month.

## 2014-05-19 NOTE — Assessment & Plan Note (Signed)
Improved control on crestor.

## 2014-05-19 NOTE — Assessment & Plan Note (Signed)
Inadequate control on spiriva. Quit smoking. Will see how se does off cigs, she will return for spirometry at next app in 1 month.

## 2014-05-25 ENCOUNTER — Encounter: Payer: Self-pay | Admitting: Family Medicine

## 2014-05-25 ENCOUNTER — Ambulatory Visit: Payer: Self-pay | Admitting: Family Medicine

## 2014-05-31 ENCOUNTER — Ambulatory Visit (INDEPENDENT_AMBULATORY_CARE_PROVIDER_SITE_OTHER): Payer: Medicare Other | Admitting: Family Medicine

## 2014-05-31 ENCOUNTER — Encounter: Payer: Self-pay | Admitting: Family Medicine

## 2014-05-31 VITALS — BP 130/70 | HR 95 | Temp 98.1°F | Ht 62.0 in | Wt 133.8 lb

## 2014-05-31 DIAGNOSIS — J4489 Other specified chronic obstructive pulmonary disease: Secondary | ICD-10-CM

## 2014-05-31 DIAGNOSIS — F17200 Nicotine dependence, unspecified, uncomplicated: Secondary | ICD-10-CM

## 2014-05-31 DIAGNOSIS — F332 Major depressive disorder, recurrent severe without psychotic features: Secondary | ICD-10-CM

## 2014-05-31 DIAGNOSIS — J449 Chronic obstructive pulmonary disease, unspecified: Secondary | ICD-10-CM

## 2014-05-31 MED ORDER — VENLAFAXINE HCL ER 75 MG PO CP24: 75.0000 mg | ORAL_CAPSULE | Freq: Every day | ORAL | Status: DC

## 2014-05-31 NOTE — Progress Notes (Signed)
Pre visit review using our clinic review tool, if applicable. No additional management support is needed unless otherwise documented below in the visit note. 

## 2014-05-31 NOTE — Assessment & Plan Note (Signed)
Worsening. Never increased to maintenance dose venlafaxine and new event of brother's death.  Encourage counseling or support group for breavement. Increase venalfaxine to 75 mg daily.

## 2014-05-31 NOTE — Progress Notes (Signed)
   Subjective:    Patient ID: Cindy Burns, female    DOB: [birth year], 68 y.o.   MRN: [id number]  HPI 68 year old female presents for breast and pelvic exam ( DVE only). Hx of breast cancer, s/p lumpectomy, chemo and radiation, last mammogram 2010 SHE REFUSES.   She was also due at last OV for spirometry but we ran out of time. Unfortunately she has restarted smoking given stress. She stopped chantix. She uses spiriva for COPD. Spirometry was normal today!  At last OV she was started on  venlafaxine for depression. Only on 37.5 mg daily  Her brother passed away last week. FROM COPD.    Review of Systems  Constitutional: Negative for fever and fatigue.  HENT: Negative for ear pain.   Eyes: Negative for pain.  Respiratory: Positive for cough and shortness of breath. Negative for chest tightness.   Cardiovascular: Negative for chest pain, palpitations and leg swelling.  Gastrointestinal: Negative for abdominal pain.  Genitourinary: Negative for dysuria.       Objective:   Physical Exam  Constitutional: Vital signs are normal. She appears well-developed and well-nourished. She is cooperative.  Non-toxic appearance. She does not appear ill. No distress.  HENT:  Head: Normocephalic.  Right Ear: Hearing, tympanic membrane, external ear and ear canal normal.  Left Ear: Hearing, tympanic membrane, external ear and ear canal normal.  Nose: Nose normal.  Eyes: Conjunctivae, EOM and lids are normal. Pupils are equal, round, and reactive to light. Lids are everted and swept, no foreign bodies found.  Neck: Trachea normal and normal range of motion. Neck supple. Carotid bruit is not present. No mass and no thyromegaly present.  Cardiovascular: Normal rate, regular rhythm, S1 normal, S2 normal, normal heart sounds and intact distal pulses.  Exam reveals no gallop.   No murmur heard. Pulmonary/Chest: Effort normal and breath sounds normal. No respiratory distress. She has no wheezes. She  has no rhonchi. She has no rales.  Abdominal: Soft. Normal appearance and bowel sounds are normal. She exhibits no distension, no fluid wave, no abdominal bruit and no mass. There is no hepatosplenomegaly. There is no tenderness. There is no rebound, no guarding and no CVA tenderness. No hernia.  Genitourinary: Vagina normal and uterus normal. No breast swelling, tenderness, discharge or bleeding. Pelvic exam was performed with patient supine. There is no rash, tenderness or lesion on the right labia. There is no rash, tenderness or lesion on the left labia. Uterus is not enlarged and not tender. Right adnexum displays no mass, no tenderness and no fullness. Left adnexum displays no mass, no tenderness and no fullness.  Lymphadenopathy:    She has no cervical adenopathy.    She has no axillary adenopathy.  Neurological: She is alert. She has normal strength. No cranial nerve deficit or sensory deficit.  Skin: Skin is warm, dry and intact. No rash noted.  Psychiatric: Her speech is normal and behavior is normal. Judgment normal. Her mood appears not anxious. Cognition and memory are normal. She does not exhibit a depressed mood.          Assessment & Plan:  GYN EXAM:  Breast exam and DVE performed.

## 2014-05-31 NOTE — Assessment & Plan Note (Signed)
Pt restarted smoking with death of her bronther.  Spirometry today looks good on spiriva. Recommended cessation of smoking, continue chantix when ready.

## 2014-05-31 NOTE — Patient Instructions (Signed)
Increase venlafaxine to 75 mg daily ( 2 x 37.5 mg). Continue chantix. Try to quit smoking again when you are ready. If shortness of breath is not improving let me know.

## 2014-07-12 ENCOUNTER — Telehealth: Payer: Self-pay

## 2014-07-12 NOTE — Telephone Encounter (Signed)
Pt recently had an eye exam on 04/01/14 at [facility].  Office was asked to fax a copy of patient's eye exam report to [phone].

## 2014-07-15 NOTE — Telephone Encounter (Signed)
Fax received

## 2014-07-19 ENCOUNTER — Other Ambulatory Visit: Payer: Medicare Other

## 2014-07-19 ENCOUNTER — Telehealth: Payer: Self-pay | Admitting: Family Medicine

## 2014-07-19 DIAGNOSIS — E119 Type 2 diabetes mellitus without complications: Secondary | ICD-10-CM

## 2014-07-19 NOTE — Telephone Encounter (Signed)
Message copied by [name] on Tue Jul 19, 2014  7:31 AM ------      Message from: Ellamae Sia      Created: Wed Jul 13, 2014  6:11 PM      Regarding: Lab orders for Tuesday, 10.13.15       Lab orders for a 3 month f/u ------

## 2014-07-26 ENCOUNTER — Ambulatory Visit: Payer: Medicare Other | Admitting: Family Medicine

## 2014-07-29 ENCOUNTER — Other Ambulatory Visit (INDEPENDENT_AMBULATORY_CARE_PROVIDER_SITE_OTHER): Payer: Medicare Other

## 2014-07-29 DIAGNOSIS — E119 Type 2 diabetes mellitus without complications: Secondary | ICD-10-CM

## 2014-07-29 LAB — COMPREHENSIVE METABOLIC PANEL
ALT: 22 U/L (ref 0–35)
AST: 23 U/L (ref 0–37)
Albumin: 3.7 g/dL (ref 3.5–5.2)
Alkaline Phosphatase: 68 U/L (ref 39–117)
BUN: 10 mg/dL (ref 6–23)
CO2: 25 mEq/L (ref 19–32)
Calcium: 9.4 mg/dL (ref 8.4–10.5)
Chloride: 98 mEq/L (ref 96–112)
Creatinine, Ser: 0.8 mg/dL (ref 0.4–1.2)
GFR: 78.54 mL/min (ref >60.00)
Glucose, Bld: 116 mg/dL — ABNORMAL HIGH (ref 70–99)
Potassium: 4.7 mEq/L (ref 3.5–5.1)
Sodium: 134 mEq/L — ABNORMAL LOW (ref 135–145)
Total Bilirubin: 0.4 mg/dL (ref 0.2–1.2)
Total Protein: 7.6 g/dL (ref 6.0–8.3)

## 2014-07-29 LAB — LIPID PANEL
Cholesterol: 275 mg/dL — ABNORMAL HIGH (ref 0–200)
HDL: 41.5 mg/dL (ref >39.00)
NonHDL: 233.5
Total CHOL/HDL Ratio: 7
Triglycerides: 547 mg/dL — ABNORMAL HIGH (ref 0.0–149.0)
VLDL: 109.4 mg/dL — ABNORMAL HIGH (ref 0.0–40.0)

## 2014-07-29 LAB — LDL CHOLESTEROL, DIRECT: Direct LDL: 154.3 mg/dL

## 2014-07-29 LAB — HEMOGLOBIN A1C: Hgb A1c MFr Bld: 6.5 % (ref 4.6–6.5)

## 2014-07-29 LAB — MICROALBUMIN / CREATININE URINE RATIO
Creatinine,U: 85.6 mg/dL
Microalb Creat Ratio: 0.5 mg/g (ref 0.0–30.0)
Microalb, Ur: 0.4 mg/dL (ref 0.0–1.9)

## 2014-07-29 NOTE — Addendum Note (Signed)
Addended by: Ellamae Sia on: 07/29/2014 10:31 AM   Modules accepted: Orders

## 2014-08-05 ENCOUNTER — Encounter: Payer: Self-pay | Admitting: Family Medicine

## 2014-08-05 ENCOUNTER — Ambulatory Visit (INDEPENDENT_AMBULATORY_CARE_PROVIDER_SITE_OTHER): Payer: Medicare Other | Admitting: Family Medicine

## 2014-08-05 VITALS — BP 120/70 | HR 83 | Temp 97.8°F | Ht 62.0 in | Wt 142.2 lb

## 2014-08-05 DIAGNOSIS — Z23 Encounter for immunization: Secondary | ICD-10-CM

## 2014-08-05 DIAGNOSIS — E782 Mixed hyperlipidemia: Secondary | ICD-10-CM

## 2014-08-05 DIAGNOSIS — E119 Type 2 diabetes mellitus without complications: Secondary | ICD-10-CM

## 2014-08-05 DIAGNOSIS — F331 Major depressive disorder, recurrent, moderate: Secondary | ICD-10-CM

## 2014-08-05 LAB — HM DIABETES FOOT EXAM

## 2014-08-05 NOTE — Assessment & Plan Note (Signed)
Improved control on venlafaxine.

## 2014-08-05 NOTE — Progress Notes (Signed)
Pre visit review using our clinic review tool, if applicable. No additional management support is needed unless otherwise documented below in the visit note. 

## 2014-08-05 NOTE — Progress Notes (Signed)
68 year old female presents for DM follow up 3 months.  Elevated Cholesterol: LDL far from goal, trig very high.  On crestor,  She has been frying a lot more often lately. LDL goal < 100.  Lab Results  Component Value Date   CHOL 275* 07/29/2014   HDL 41.50 07/29/2014   LDLCALC 26 12/17/2013   LDLDIRECT 154.3 07/29/2014   TRIG 547.0 Triglyceride is over 400; calculations on Lipids are invalid.* 07/29/2014   CHOLHDL 7 07/29/2014  Using medications without problems:None  Muscle aches: None  Diet compliance:Moderate  Exercise: walking  some Other complaints:   Diabetes: Well controlled Metformin 500 mg twice daily due to SE at higher dose Lab Results  Component Value Date   HGBA1C 6.5 07/29/2014  Using medications without difficulties:  Hypoglycemic episodes:None  Hyperglycemic episodes:None  Feet problems:neuropathy, no ulcers  Uptodate with eye exam , had cataract surgery last week. Blood Sugars averaging: checking twice a day FBS 166, 2 hours after meals 88-166  Wt Readings from Last 3 Encounters:  08/05/14 142 lb 4 oz (64.524 kg)  05/31/14 133 lb 12 oz (60.669 kg)  04/22/14 136 lb 12 oz (62.029 kg)    COPD, good control on spiriva at last OV.  Depression improved control: on venlafaxine 75 mg daily now in last month.   Review of Systems  Constitutional: Negative for fever and fatigue.  HENT: Negative for ear pain.  Eyes: Negative for pain.  Respiratory: Negative for chest tightness and shortness of breath.  Cardiovascular: Negative for chest pain, palpitations and leg swelling.  Gastrointestinal: Negative for abdominal pain.  Genitourinary: Negative for dysuria.  Objective:   Physical Exam  Constitutional: Vital signs are normal. She appears well-developed and well-nourished. She is cooperative. Non-toxic appearance. She does not appear ill. No distress.  HENT:  Head: Normocephalic.  Right Ear: Hearing, tympanic membrane, external ear and ear canal normal.  Left  Ear: Hearing, tympanic membrane, external ear and ear canal normal.  Nose: Nose normal.  Eyes: Conjunctivae, EOM and lids are normal. Pupils are equal, round, and reactive to light. Lids are everted and swept, no foreign bodies found.  Neck: Trachea normal and normal range of motion. Neck supple. Carotid bruit is not present. No mass and no thyromegaly present.  Cardiovascular: Normal rate, regular rhythm, S1 normal, S2 normal, normal heart sounds and intact distal pulses. Exam reveals no gallop.  No murmur heard.  Pulmonary/Chest: Effort normal and breath sounds normal. No respiratory distress. She has no wheezes. She has no rhonchi. She has no rales.  Abdominal: Soft. Normal appearance and bowel sounds are normal. She exhibits no distension, no fluid wave, no abdominal bruit and no mass. There is no hepatosplenomegaly. There is no tenderness. There is no rebound, no guarding and no CVA tenderness. No hernia.  Lymphadenopathy:  She has no cervical adenopathy.  She has no axillary adenopathy.  Neurological: She is alert. She has normal strength. No cranial nerve deficit or sensory deficit.  Skin: Skin is warm, dry and intact. No rash noted.  Psychiatric: Her speech is normal and behavior is normal. Judgment normal. Her mood appears not anxious. Cognition and memory are normal. She does not exhibit a depressed mood.   Diabetic foot exam: Normal inspection No skin breakdown No calluses  Normal DP pulses Normal sensation to light touch and monofilament Nails thickened and turned in  Assessment & Plan:

## 2014-08-05 NOTE — Assessment & Plan Note (Signed)
Improved control. Continue current regimen. Encouraged exercise, weight loss, healthy eating habits.

## 2014-08-05 NOTE — Assessment & Plan Note (Signed)
Much worsened control especially of trig due to frying and decrease in exercsie. Return to healthy lifestyle. Start omega 3 fatty acids.

## 2014-08-05 NOTE — Patient Instructions (Addendum)
Stop frying foods. Decrease cholesterol in diet. Continue crestor. Can start flax seed oil 2000 mg divided daily. Get back to regualr exercise.

## 2014-08-08 ENCOUNTER — Emergency Department (HOSPITAL_COMMUNITY)
Admission: EM | Admit: 2014-08-08 | Discharge: 2014-08-08 | Disposition: A | Discharge status: Home / Self Care | Payer: Medicare Other | Attending: Emergency Medicine | Admitting: Emergency Medicine

## 2014-08-08 ENCOUNTER — Emergency Department (HOSPITAL_COMMUNITY): Payer: Medicare Other

## 2014-08-08 ENCOUNTER — Telehealth: Payer: Self-pay | Admitting: *Deleted

## 2014-08-08 ENCOUNTER — Encounter (HOSPITAL_COMMUNITY): Payer: Self-pay | Admitting: *Deleted

## 2014-08-08 DIAGNOSIS — S023XXA Fracture of orbital floor, initial encounter for closed fracture: Secondary | ICD-10-CM | POA: Insufficient documentation

## 2014-08-08 DIAGNOSIS — J449 Chronic obstructive pulmonary disease, unspecified: Secondary | ICD-10-CM | POA: Diagnosis not present

## 2014-08-08 DIAGNOSIS — S01411A Laceration without foreign body of right cheek and temporomandibular area, initial encounter: Secondary | ICD-10-CM | POA: Diagnosis not present

## 2014-08-08 DIAGNOSIS — Z9889 Other specified postprocedural states: Secondary | ICD-10-CM | POA: Diagnosis not present

## 2014-08-08 DIAGNOSIS — Z79899 Other long term (current) drug therapy: Secondary | ICD-10-CM | POA: Diagnosis not present

## 2014-08-08 DIAGNOSIS — F329 Major depressive disorder, single episode, unspecified: Secondary | ICD-10-CM | POA: Diagnosis not present

## 2014-08-08 DIAGNOSIS — Y9389 Activity, other specified: Secondary | ICD-10-CM | POA: Insufficient documentation

## 2014-08-08 DIAGNOSIS — Z23 Encounter for immunization: Secondary | ICD-10-CM | POA: Insufficient documentation

## 2014-08-08 DIAGNOSIS — E119 Type 2 diabetes mellitus without complications: Secondary | ICD-10-CM | POA: Insufficient documentation

## 2014-08-08 DIAGNOSIS — W01198A Fall on same level from slipping, tripping and stumbling with subsequent striking against other object, initial encounter: Secondary | ICD-10-CM | POA: Insufficient documentation

## 2014-08-08 DIAGNOSIS — Z853 Personal history of malignant neoplasm of breast: Secondary | ICD-10-CM | POA: Diagnosis not present

## 2014-08-08 DIAGNOSIS — Z720 Tobacco use: Secondary | ICD-10-CM | POA: Insufficient documentation

## 2014-08-08 DIAGNOSIS — Y9289 Other specified places as the place of occurrence of the external cause: Secondary | ICD-10-CM | POA: Insufficient documentation

## 2014-08-08 DIAGNOSIS — IMO0002 Reserved for concepts with insufficient information to code with codable children: Secondary | ICD-10-CM

## 2014-08-08 DIAGNOSIS — E785 Hyperlipidemia, unspecified: Secondary | ICD-10-CM | POA: Diagnosis not present

## 2014-08-08 DIAGNOSIS — S0285XA Fracture of orbit, unspecified, initial encounter for closed fracture: Secondary | ICD-10-CM

## 2014-08-08 DIAGNOSIS — W19XXXA Unspecified fall, initial encounter: Secondary | ICD-10-CM

## 2014-08-08 MED ORDER — TETANUS-DIPHTH-ACELL PERTUSSIS 5-2.5-18.5 LF-MCG/0.5 IM SUSP
0.5000 mL | Freq: Once | INTRAMUSCULAR | Status: AC
  Administered 2014-08-08: 0.5 mL via INTRAMUSCULAR
  Filled 2014-08-08: qty 0.5

## 2014-08-08 MED ORDER — NAPROXEN 250 MG PO TABS
500.0000 mg | ORAL_TABLET | Freq: Once | ORAL | Status: AC
  Administered 2014-08-08: 500 mg via ORAL
  Filled 2014-08-08: qty 2

## 2014-08-08 MED ORDER — CEFAZOLIN SODIUM 1-5 GM-% IV SOLN
1.0000 g | Freq: Once | INTRAVENOUS | Status: AC
  Administered 2014-08-08: 1 g via INTRAVENOUS
  Filled 2014-08-08: qty 50

## 2014-08-08 MED ORDER — LIDOCAINE-EPINEPHRINE (PF) 2 %-1:200000 IJ SOLN
10.0000 mL | Freq: Once | INTRAMUSCULAR | Status: AC
  Administered 2014-08-08: 10 mL via INTRADERMAL
  Filled 2014-08-08: qty 20

## 2014-08-08 NOTE — ED Notes (Signed)
Opthalmology at bedside.

## 2014-08-08 NOTE — ED Notes (Signed)
Pt states she had cataract surgery this am to right eye. When she arrived home this morning she slipped and fell hitting the right side of her face. Pt denies LOC. On exam patient with swelling to right eye, states vision is blurry to right eye, small laceration noted to right cheek bleeding is controlled. Pt does not take blood thinners.

## 2014-08-08 NOTE — Telephone Encounter (Signed)
Pt walked into office with a cut over her eye, per pt and her friend pt had cataract surgery last week and today pt fell onto brick step and cut her eye. Per Dr. Lorelei Pont pt should be seen in the ED, pt's friend will drive her to the ED. Mearl Latin also assisted with patient I will forward note to her so add in her comments.

## 2014-08-08 NOTE — ED Provider Notes (Signed)
CSN: [id number]     Arrival date & time 08/08/14  1201 History  This chart was scribed for non-physician practitioner, Britt Bottom, NP working with Jasper Riling. Alvino Chapel, MD by Frederich Balding, ED scribe. This patient was seen in room TR04C/TR04C and the patient's care was started at 1:07 PM.    Chief Complaint  Patient presents with  . Fall  . Facial Laceration   The history is provided by the patient. No language interpreter was used.    HPI Comments: Cindy Burns is a 68 y.o. female who presents to the Emergency Department complaining of a fall that occurred this morning around 11:30 AM. States she slipped on some leaves and hit the right side of her face on concrete steps. Denies LOC. She has a laceration to her right cheek and swelling around her right eye. Rates pain 9/10. Pt had cataract surgery this morning prior to the fall and states her vision was normal before she left the eye doctor. She now reports blurry vision.   Past Medical History  Diagnosis Date  . Depression   . Diabetes mellitus without complication   . Hyperlipidemia   . COPD (chronic obstructive pulmonary disease)   . Cancer 2005    Breast Cancer   Past Surgical History  Procedure Laterality Date  . Cholecystectomy  2013  . Breast surgery  2005  . Cataract extraction     Family History  Problem Relation Age of Onset  . Arthritis Mother   . Alzheimer's disease Mother   . Dementia Father   . COPD Brother   . COPD Brother    History  Substance Use Topics  . Smoking status: Current Every Day Smoker -- 0.03 packs/day for 40 years    Types: Cigarettes  . Smokeless tobacco: Never Used  . Alcohol Use: No   OB History    No data available     Review of Systems  HENT: Positive for facial swelling.   Eyes: Positive for visual disturbance.  Skin: Positive for wound.  All other systems reviewed and are negative.  Allergies  Review of patient's allergies indicates no known allergies.  Home  Medications   Prior to Admission medications   Medication Sig Start Date End Date Taking? Authorizing Provider  BESIVANCE 0.6 % SUSP  07/19/14   Historical Provider, MD  DUREZOL 0.05 % EMUL  07/19/14   Historical Provider, MD  gabapentin (NEURONTIN) 400 MG capsule Take 2 capsules (800 mg total) by mouth at bedtime. 04/13/14   [name], MD  glipiZIDE (GLUCOTROL XL) 10 MG 24 hr tablet Take 1 tablet (10 mg total) by mouth daily with breakfast. 05/18/14   [name], MD  ILEVRO 0.3 % SUSP  07/19/14   Historical Provider, MD  ipratropium (ATROVENT HFA) 17 MCG/ACT inhaler Inhale 2 puffs into the lungs as needed for wheezing. 04/13/14   [name], MD  metFORMIN (GLUCOPHAGE) 500 MG tablet Take 1 tablet (500 mg total) by mouth 2 (two) times daily with a meal. 04/13/14   Amy E Diona Browner, MD  rosuvastatin (CRESTOR) 40 MG tablet Take 1 tablet (40 mg total) by mouth daily. 02/14/14   [name], MD  tiotropium (SPIRIVA) 18 MCG inhalation capsule Place 1 capsule (18 mcg total) into inhaler and inhale daily. 04/13/14   [name], MD  triamcinolone cream (KENALOG) 0.5 % Apply 1 application topically 2 (two) times daily. 04/15/14   [name], MD  varenicline (CHANTIX) 1 MG  tablet Take 1 tablet (1 mg total) by mouth 2 (two) times daily. 10/26/13   [name], MD  venlafaxine XR (EFFEXOR-XR) 75 MG 24 hr capsule Take 1 capsule (75 mg total) by mouth daily with breakfast. 05/31/14   Amy E Bedsole, MD   BP 157/70 mmHg  Pulse 94  Temp(Src) 98 F (36.7 C) (Oral)  Resp 19  SpO2 97%   Physical Exam  Constitutional: She is oriented to person, place, and time. She appears well-developed and well-nourished. No distress.  HENT:  Head: Normocephalic. Head is with contusion and with laceration.    About 2 cm linear horizontal laceration to right cheek.   Eyes: Pupils are equal, round, and reactive to light.  Infraorbital swelling with ecchymosis.    Neck: Neck supple. No tracheal deviation present.   Cardiovascular: Normal rate.   Pulmonary/Chest: Effort normal. No respiratory distress.  Musculoskeletal: Normal range of motion.  Neurological: She is alert and oriented to person, place, and time.  Skin: Skin is warm and dry.  Psychiatric: She has a normal mood and affect. Her behavior is normal.  Nursing note and vitals reviewed.   ED Course  Procedures (including critical care time)  DIAGNOSTIC STUDIES: Oxygen Saturation is 97% on RA, normal by my interpretation.    COORDINATION OF CARE: 1:11 PM-Discussed treatment plan which includes pain medication, visual acuity and CT scan with pt at bedside and pt agreed to plan.   Labs Review Labs Reviewed - No data to display  Imaging Review Ct Head Wo Contrast  08/08/2014   CLINICAL DATA:  [place] [address] and hit RIGHT side of face and head. Recent cataract surgery earlier today.  EXAM: CT HEAD WITHOUT CONTRAST  CT MAXILLOFACIAL WITHOUT CONTRAST  TECHNIQUE: Multidetector CT imaging of the head and maxillofacial structures were performed using the standard protocol without intravenous contrast. Multiplanar CT image reconstructions of the maxillofacial structures were also generated.  COMPARISON:  None.  FINDINGS: CT HEAD FINDINGS  No evidence for acute infarction, hemorrhage, mass lesion, hydrocephalus, or extra-axial fluid. Normal for age cerebral volume. Mild hypoattenuation white matter suggesting small vessel disease. Calvarium intact. No mastoid or middle ear fluid. Vascular calcification of the carotid siphons.  CT MAXILLOFACIAL FINDINGS  There is an acute inferior blowout fracture of the RIGHT orbit. Hyperattenuating material within the RIGHT maxillary sinus suggesting hemorrhage. There is 9 mm of orbital floor depression laterally, associated with inferomedial orbital emphysema. Proptosis is present without intraconal hemorrhage. The globe is grossly intact. BILATERAL cataract extraction. Slight layering fluid in this sphenoid sinus could be  inflammatory. Similarly slight asymmetric fluid in the RIGHT ethmoid air cells not clearly posttraumatic. No nasal bone or zygomatic arch fracture. TMJs located. No mandibular fracture. No nasal cavity masses.  IMPRESSION: No acute intracranial findings.  Acute inferior blowout fracture of the RIGHT orbit. Hemosinus. 9 mm of orbital floor depression with orbital emphysema and proptosis.  Findings discussed with ordering provider.   Electronically Signed   By: Rolla Flatten M.D.   On: 08/08/2014 14:22   Ct Maxillofacial Wo Cm  08/08/2014   CLINICAL DATA:  [place] [address] and hit RIGHT side of face and head. Recent cataract surgery earlier today.  EXAM: CT HEAD WITHOUT CONTRAST  CT MAXILLOFACIAL WITHOUT CONTRAST  TECHNIQUE: Multidetector CT imaging of the head and maxillofacial structures were performed using the standard protocol without intravenous contrast. Multiplanar CT image reconstructions of the maxillofacial structures were also generated.  COMPARISON:  None.  FINDINGS: CT HEAD FINDINGS  No evidence  for acute infarction, hemorrhage, mass lesion, hydrocephalus, or extra-axial fluid. Normal for age cerebral volume. Mild hypoattenuation white matter suggesting small vessel disease. Calvarium intact. No mastoid or middle ear fluid. Vascular calcification of the carotid siphons.  CT MAXILLOFACIAL FINDINGS  There is an acute inferior blowout fracture of the RIGHT orbit. Hyperattenuating material within the RIGHT maxillary sinus suggesting hemorrhage. There is 9 mm of orbital floor depression laterally, associated with inferomedial orbital emphysema. Proptosis is present without intraconal hemorrhage. The globe is grossly intact. BILATERAL cataract extraction. Slight layering fluid in this sphenoid sinus could be inflammatory. Similarly slight asymmetric fluid in the RIGHT ethmoid air cells not clearly posttraumatic. No nasal bone or zygomatic arch fracture. TMJs located. No mandibular fracture. No nasal cavity masses.   IMPRESSION: No acute intracranial findings.  Acute inferior blowout fracture of the RIGHT orbit. Hemosinus. 9 mm of orbital floor depression with orbital emphysema and proptosis.  Findings discussed with ordering provider.   Electronically Signed   By: Rolla Flatten M.D.   On: 08/08/2014 14:22     EKG Interpretation None      MDM   Final diagnoses:  Fall   68 yo female presenting after fall with swelling and bruising to rt zygoma.  She recently had cataract surgery this am.  Pt denies LOC.  Discussed case with Dr. Alvino Chapel.    Tdap, Visual acuity, Naproxen, CT maxillofacial and re-eval.  Rn reports pt seemed frustrated about getting into wheelchair.  Will add-on CT head.     2:10 PM Called by radiologist regarding result of CT scan.  Reports pt has blow-out fracture of rt orbit.  Discussed case with Dr. Christy Gentles who will assume care.   Pt and visitor updated and pt transported via wheelchair to Trauma A bed.  Filed Vitals:   08/08/14 1242  BP: 157/70  Pulse: 94  Temp: 98 F (36.7 C)  TempSrc: Oral  Resp: 19  SpO2: 97%   Meds given in ED:  Medications  naproxen (NAPROSYN) tablet 500 mg (500 mg Oral Given 08/08/14 1345)  Tdap (BOOSTRIX) injection 0.5 mL (0.5 mLs Intramuscular Given 08/08/14 1418)    New Prescriptions   No medications on file     I personally performed the services described in this documentation, which was scribed in my presence. The recorded information has been reviewed and is accurate.  Britt Bottom, NP 08/08/14 1433

## 2014-08-08 NOTE — ED Provider Notes (Signed)
CSN: [id number]     Arrival date & time 08/08/14  1201 History   First MD Initiated Contact with Patient 08/08/14 1258     Chief Complaint  Patient presents with  . Fall  . Facial Laceration     (Consider location/radiation/quality/duration/timing/severity/associated sxs/prior Treatment) Patient is a 68 y.o. female presenting with fall. The history is provided by the patient. No language interpreter was used.  Fall This is a new problem. The current episode started today. The problem occurs rarely. The problem has been unchanged. Pertinent negatives include no abdominal pain, chest pain, congestion, coughing, fever, headaches, nausea, rash, sore throat or vomiting. Exacerbated by: palpation. Treatments tried: aleve. The treatment provided mild relief.    Past Medical History  Diagnosis Date  . Depression   . Diabetes mellitus without complication   . Hyperlipidemia   . COPD (chronic obstructive pulmonary disease)   . Cancer 2005    Breast Cancer   Past Surgical History  Procedure Laterality Date  . Cholecystectomy  2013  . Breast surgery  2005  . Cataract extraction     Family History  Problem Relation Age of Onset  . Arthritis Mother   . Alzheimer's disease Mother   . Dementia Father   . COPD Brother   . COPD Brother    History  Substance Use Topics  . Smoking status: Current Every Day Smoker -- 0.03 packs/day for 40 years    Types: Cigarettes  . Smokeless tobacco: Never Used  . Alcohol Use: No   OB History    No data available     Review of Systems  Constitutional: Negative for fever.  HENT: Negative for congestion, rhinorrhea and sore throat.   Respiratory: Negative for cough and shortness of breath.   Cardiovascular: Negative for chest pain.  Gastrointestinal: Negative for nausea, vomiting, abdominal pain and diarrhea.  Genitourinary: Negative for dysuria and hematuria.  Skin: Positive for wound (right maxillary region). Negative for rash.  Neurological:  Negative for syncope, light-headedness and headaches.  All other systems reviewed and are negative.     Allergies  Review of patient's allergies indicates no known allergies.  Home Medications   Prior to Admission medications   Medication Sig Start Date End Date Taking? Authorizing Provider  BESIVANCE 0.6 % SUSP Place 1 drop into both eyes 3 (three) times daily.  07/19/14  Yes Historical Provider, MD  DUREZOL 0.05 % EMUL Place 1 drop into the left eye 3 (three) times daily.  07/19/14  Yes Historical Provider, MD  gabapentin (NEURONTIN) 400 MG capsule Take 2 capsules (800 mg total) by mouth at bedtime. 04/13/14  Yes [name], MD  glipiZIDE (GLUCOTROL XL) 10 MG 24 hr tablet Take 1 tablet (10 mg total) by mouth daily with breakfast. 05/18/14  Yes Amy E Bedsole, MD  ILEVRO 0.3 % SUSP Place 1 drop into the left eye at bedtime.  07/19/14  Yes Historical Provider, MD  ipratropium (ATROVENT HFA) 17 MCG/ACT inhaler Inhale 2 puffs into the lungs as needed for wheezing. Patient taking differently: Inhale 2 puffs into the lungs 2 (two) times daily as needed for wheezing.  04/13/14  Yes [name], MD  metFORMIN (GLUCOPHAGE) 500 MG tablet Take 1 tablet (500 mg total) by mouth 2 (two) times daily with a meal. 04/13/14  Yes Amy E Bedsole, MD  rosuvastatin (CRESTOR) 40 MG tablet Take 1 tablet (40 mg total) by mouth daily. 02/14/14  Yes [name], MD  tiotropium (SPIRIVA) 18 MCG  inhalation capsule Place 1 capsule (18 mcg total) into inhaler and inhale daily. 04/13/14  Yes [name], MD  varenicline (CHANTIX) 1 MG tablet Take 1 tablet (1 mg total) by mouth 2 (two) times daily. 10/26/13  Yes [name], MD  venlafaxine XR (EFFEXOR-XR) 75 MG 24 hr capsule Take 1 capsule (75 mg total) by mouth daily with breakfast. 05/31/14  Yes Amy E Bedsole, MD  triamcinolone cream (KENALOG) 0.5 % Apply 1 application topically 2 (two) times daily. 04/15/14   Amy E Bedsole, MD   BP 157/70 mmHg  Pulse 94  Temp(Src) 98  F (36.7 C) (Oral)  Resp 19  SpO2 97% Physical Exam  Constitutional: She is oriented to person, place, and time. She appears well-developed and well-nourished.  HENT:  Head: Normocephalic.  Right Ear: External ear normal.  Left Ear: External ear normal.  5 cm laceration to right maxilla  Eyes: EOM are normal.  Dilated right pupil 4-5 mm, sluggish Visual Acuity: R 20/50, L 20/50 Proptosis to right eye  Neck: Normal range of motion. Neck supple.  Cardiovascular: Normal rate, regular rhythm and intact distal pulses.  Exam reveals no gallop and no friction rub.   No murmur heard. Pulmonary/Chest: Effort normal and breath sounds normal. No respiratory distress. She has no wheezes. She has no rales. She exhibits no tenderness.  Abdominal: Soft. Bowel sounds are normal. She exhibits no distension. There is no tenderness. There is no rebound.  Musculoskeletal: Normal range of motion. She exhibits no edema or tenderness.  Lymphadenopathy:    She has no cervical adenopathy.  Neurological: She is alert and oriented to person, place, and time.  Skin: Skin is warm. No rash noted.  Psychiatric: She has a normal mood and affect. Her behavior is normal.  Nursing note and vitals reviewed.   ED Course  LACERATION REPAIR Date/Time: 08/08/2014 6:26 PM Performed by: Joanell Rising Authorized by: Ezequiel Essex Consent: Verbal consent obtained. Risks and benefits: risks, benefits and alternatives were discussed Consent given by: patient Patient identity confirmed: arm band and hospital-assigned identification number Body area: head/neck Location details: right cheek Laceration length: 3 cm Foreign bodies: no foreign bodies Tendon involvement: none Nerve involvement: none Vascular damage: no Anesthesia: local infiltration Local anesthetic: lidocaine 1% with epinephrine Anesthetic total: 3 ml Preparation: Patient was prepped and draped in the usual sterile fashion. Irrigation solution:  saline Irrigation method: jet lavage Amount of cleaning: standard Debridement: none Degree of undermining: none Skin closure: 6-0 Prolene Number of sutures: 6 Technique: simple Approximation: close Approximation difficulty: simple Dressing: antibiotic ointment Patient tolerance: Patient tolerated the procedure well with no immediate complications   (including critical care time) Labs Review Labs Reviewed - No data to display  Imaging Review Ct Head Wo Contrast  08/08/2014   CLINICAL DATA:  [address] and hit RIGHT side of face and head. Recent cataract surgery earlier today.  EXAM: CT HEAD WITHOUT CONTRAST  CT MAXILLOFACIAL WITHOUT CONTRAST  TECHNIQUE: Multidetector CT imaging of the head and maxillofacial structures were performed using the standard protocol without intravenous contrast. Multiplanar CT image reconstructions of the maxillofacial structures were also generated.  COMPARISON:  None.  FINDINGS: CT HEAD FINDINGS  No evidence for acute infarction, hemorrhage, mass lesion, hydrocephalus, or extra-axial fluid. Normal for age cerebral volume. Mild hypoattenuation white matter suggesting small vessel disease. Calvarium intact. No mastoid or middle ear fluid. Vascular calcification of the carotid siphons.  CT MAXILLOFACIAL FINDINGS  There is an acute inferior blowout fracture  of the RIGHT orbit. Hyperattenuating material within the RIGHT maxillary sinus suggesting hemorrhage. There is 9 mm of orbital floor depression laterally, associated with inferomedial orbital emphysema. Proptosis is present without intraconal hemorrhage. The globe is grossly intact. BILATERAL cataract extraction. Slight layering fluid in this sphenoid sinus could be inflammatory. Similarly slight asymmetric fluid in the RIGHT ethmoid air cells not clearly posttraumatic. No nasal bone or zygomatic arch fracture. TMJs located. No mandibular fracture. No nasal cavity masses.  IMPRESSION: No acute intracranial findings.  Acute  inferior blowout fracture of the RIGHT orbit. Hemosinus. 9 mm of orbital floor depression with orbital emphysema and proptosis.  Findings discussed with ordering provider.   Electronically Signed   By: Rolla Flatten M.D.   On: 08/08/2014 14:22   Ct Maxillofacial Wo Cm  08/08/2014   CLINICAL DATA:  [address] and hit RIGHT side of face and head. Recent cataract surgery earlier today.  EXAM: CT HEAD WITHOUT CONTRAST  CT MAXILLOFACIAL WITHOUT CONTRAST  TECHNIQUE: Multidetector CT imaging of the head and maxillofacial structures were performed using the standard protocol without intravenous contrast. Multiplanar CT image reconstructions of the maxillofacial structures were also generated.  COMPARISON:  None.  FINDINGS: CT HEAD FINDINGS  No evidence for acute infarction, hemorrhage, mass lesion, hydrocephalus, or extra-axial fluid. Normal for age cerebral volume. Mild hypoattenuation white matter suggesting small vessel disease. Calvarium intact. No mastoid or middle ear fluid. Vascular calcification of the carotid siphons.  CT MAXILLOFACIAL FINDINGS  There is an acute inferior blowout fracture of the RIGHT orbit. Hyperattenuating material within the RIGHT maxillary sinus suggesting hemorrhage. There is 9 mm of orbital floor depression laterally, associated with inferomedial orbital emphysema. Proptosis is present without intraconal hemorrhage. The globe is grossly intact. BILATERAL cataract extraction. Slight layering fluid in this sphenoid sinus could be inflammatory. Similarly slight asymmetric fluid in the RIGHT ethmoid air cells not clearly posttraumatic. No nasal bone or zygomatic arch fracture. TMJs located. No mandibular fracture. No nasal cavity masses.  IMPRESSION: No acute intracranial findings.  Acute inferior blowout fracture of the RIGHT orbit. Hemosinus. 9 mm of orbital floor depression with orbital emphysema and proptosis.  Findings discussed with ordering provider.   Electronically Signed   By: Rolla Flatten  M.D.   On: 08/08/2014 14:22     EKG Interpretation None      MDM   Final diagnoses:  Fall  Orbital fracture, closed, initial encounter  Laceration    3:02 PM Pt is a 68 y.o. female with pertinent PMHX of DM, COPD, HLD, Depression, cataract surgery today on right eye who presents to the ED with fall. Mechanical fall today around 10Am. Endorses some blurriness of vision. No LOC or amnesia no syncope or lightheadedness. Did not have shield when patient fell. No vision loss. No other injuries. Denies chest pain or shortness of breath. Denies abdominal pain, nausea, vomiting or diarrhea. No pain with moving eye. No fevers or recent illness. Not on blood thinnders   On exam: no evidence of entrapment. Plan to consult ENT trauma and discuss with patient's opthalmologist Dr. Talbert Forest. Visual acuity 20/50 both eyes. Left pupil dilated. Normal ROM of all extremities. No chest or abdominal tenderness  Plan for Dr. Talbert Forest to evaluate in the ED. Discussed with ENT trauma on call for evaluation. Tetanus updated today, Ancef started for concern for possible open fracture  Per Dr. Talbert Forest no injury to eye, plan for close follow up tomorrow in clinic. Discussed with ENT on call: no acute surgical intervention.  Close follow up in 5 days. Please see above procedure note for laceration repair. No antibiotics indicated for discharge  Plan for discharge. Patient to follow up with ENt in 5 days for suture removal and follow up of orbital blowout fracture. Strict return precautions given.  6:26 PM:  I have discussed the diagnosis/risks/treatment options with the patient and believe the pt to be eligible for discharge home to follow-up with ENT in 5 days, Dr. Talbert Forest tomorrow for post-op check. We also discussed returning to the ED immediately if new or worsening sx occur. We discussed the sx which are most concerning (e.g., worsening symptoms) that necessitate immediate return. Any new prescriptions provided to the  patient are listed below.   New Prescriptions   No medications on file    The patient appears reasonably screened and/or stabilized for discharge and I doubt any other medical condition or other [facility] requiring further screening, evaluation or treatment in the ED at this time prior to discharge . Pt in agreement with discharge plan. Return precautions given. Pt discharged VSS   Imaging reviewed by myself and considered in medical decision making if ordered.  Imaging interpreted by radiology. Pt was discussed with my attending, Dr. Donata Clay, MD 08/09/14 [phone]

## 2014-08-08 NOTE — ED Notes (Signed)
MD notified that pt is in room for sutures

## 2014-08-08 NOTE — Consult Note (Signed)
  Ophthalmology Consult  68 y/o female s/p fall with right inferior floor fracture.  Pt had cataract surgery in this eye this am.  Pt states vision is good and has minimal pain.  Pt does have paresthesia under right eye down to right lip.  No diplopia, etc.  On exam vision was 20/30 right eye and 20/25 left eye without glasses.  Pupil was dilated in the right eye secondary from dilation for cataract surgery done today.  Extraocular motility was intact with full ductions and versions in both eyes.  Visual field full to confrontation.  Intraocular pressure was 24 in the right eye and soft to palpation in the left eye.  Pt with echhymosis of RLL. Pt with laceration on face below RLL.  Pt with mild chemosis in the right eye.  No lacerations of conjunctiva seen.  Cornea clear in both eyes and pt has 2.51mm staining wound temporally in the right eye from surgery done today.  The wound was seidel negative.  Iris was round in both eyes and PCIOL in place in both eyes.  Retina flat and intact in both eyes.    A/P 1.Open floor fracture right eye: no ocular damage from trauma.  Pt s/p phaco/pciol in the right eye with excellent outcome.  No diplopia or limitation to eye movement.  Pt should see ENT doctor in 1-2 weeks to evaluate and see if any further treatment is needed.  Pt has been given IV antibiotic and should be placed on oral antibiotic and pt should do open sneezes and no blowing nose.  I will see patient tomorrow for postop day one care for cataract surgery. 2.Facial laceration:  To be evaluated by face call prior to discharge.  Thank you for allowing me to participate in the care of this patient.  Please feel free to contact me if you have any concerns.  Darleen Crocker, M.D. (cell) [phone] (office) [phone]

## 2014-08-08 NOTE — Consult Note (Signed)
Reason for Consult:right orbital floor fracture Referring Physician: urgency room  Cindy Burns is an 68 y.o. female.  HPI: 68 year old who had underwent a cataract surgery today and fell and hit her right face. She sustained a laceration of the right cheek. She does complain about numbness of the right cheek right lateral nose and upper lip.She also has significant swelling around her eye. She presented to the emergency room and underwent a CT scan which showed a large defect orbital floor fracture. There doesn't appear to be obvious entrapment or extension of the muscle into the maxillary sinus. There is the expected swelling of the maxillary sinus from bleeding. There does appear to be proptosis but no obvious hematoma but there is some air. Ophthalmology has seen the patient and evaluated the orbit and globe. She doesn't complain about any new nasal obstruction. Doesn't complain about any diplopia. Her vision seems to be baseline. No oral cavity complaints. No neck pain.  Past Medical History  Diagnosis Date  . Depression   . Diabetes mellitus without complication   . Hyperlipidemia   . COPD (chronic obstructive pulmonary disease)   . Cancer 2005    Breast Cancer    Past Surgical History  Procedure Laterality Date  . Cholecystectomy  2013  . Breast surgery  2005  . Cataract extraction      Family History  Problem Relation Age of Onset  . Arthritis Mother   . Alzheimer's disease Mother   . Dementia Father   . COPD Brother   . COPD Brother     Social History:  reports that she has been smoking Cigarettes.  She has a 1.2 pack-year smoking history. She has never used smokeless tobacco. She reports that she does not drink alcohol or use illicit drugs.  Allergies: No Known Allergies  Medications: I have reviewed the patient's current medications.  No results found for this or any previous visit (from the past 48 hour(s)).  Ct Head Wo Contrast  08/08/2014   CLINICAL DATA:   [address] and hit RIGHT side of face and head. Recent cataract surgery earlier today.  EXAM: CT HEAD WITHOUT CONTRAST  CT MAXILLOFACIAL WITHOUT CONTRAST  TECHNIQUE: Multidetector CT imaging of the head and maxillofacial structures were performed using the standard protocol without intravenous contrast. Multiplanar CT image reconstructions of the maxillofacial structures were also generated.  COMPARISON:  None.  FINDINGS: CT HEAD FINDINGS  No evidence for acute infarction, hemorrhage, mass lesion, hydrocephalus, or extra-axial fluid. Normal for age cerebral volume. Mild hypoattenuation white matter suggesting small vessel disease. Calvarium intact. No mastoid or middle ear fluid. Vascular calcification of the carotid siphons.  CT MAXILLOFACIAL FINDINGS  There is an acute inferior blowout fracture of the RIGHT orbit. Hyperattenuating material within the RIGHT maxillary sinus suggesting hemorrhage. There is 9 mm of orbital floor depression laterally, associated with inferomedial orbital emphysema. Proptosis is present without intraconal hemorrhage. The globe is grossly intact. BILATERAL cataract extraction. Slight layering fluid in this sphenoid sinus could be inflammatory. Similarly slight asymmetric fluid in the RIGHT ethmoid air cells not clearly posttraumatic. No nasal bone or zygomatic arch fracture. TMJs located. No mandibular fracture. No nasal cavity masses.  IMPRESSION: No acute intracranial findings.  Acute inferior blowout fracture of the RIGHT orbit. Hemosinus. 9 mm of orbital floor depression with orbital emphysema and proptosis.  Findings discussed with ordering provider.   Electronically Signed   By: Rolla Flatten M.D.   On: 08/08/2014 14:22   Ct Maxillofacial Wo Cm  08/08/2014   CLINICAL DATA:  [address] and hit RIGHT side of face and head. Recent cataract surgery earlier today.  EXAM: CT HEAD WITHOUT CONTRAST  CT MAXILLOFACIAL WITHOUT CONTRAST  TECHNIQUE: Multidetector CT imaging of the head and maxillofacial  structures were performed using the standard protocol without intravenous contrast. Multiplanar CT image reconstructions of the maxillofacial structures were also generated.  COMPARISON:  None.  FINDINGS: CT HEAD FINDINGS  No evidence for acute infarction, hemorrhage, mass lesion, hydrocephalus, or extra-axial fluid. Normal for age cerebral volume. Mild hypoattenuation white matter suggesting small vessel disease. Calvarium intact. No mastoid or middle ear fluid. Vascular calcification of the carotid siphons.  CT MAXILLOFACIAL FINDINGS  There is an acute inferior blowout fracture of the RIGHT orbit. Hyperattenuating material within the RIGHT maxillary sinus suggesting hemorrhage. There is 9 mm of orbital floor depression laterally, associated with inferomedial orbital emphysema. Proptosis is present without intraconal hemorrhage. The globe is grossly intact. BILATERAL cataract extraction. Slight layering fluid in this sphenoid sinus could be inflammatory. Similarly slight asymmetric fluid in the RIGHT ethmoid air cells not clearly posttraumatic. No nasal bone or zygomatic arch fracture. TMJs located. No mandibular fracture. No nasal cavity masses.  IMPRESSION: No acute intracranial findings.  Acute inferior blowout fracture of the RIGHT orbit. Hemosinus. 9 mm of orbital floor depression with orbital emphysema and proptosis.  Findings discussed with ordering provider.   Electronically Signed   By: Rolla Flatten M.D.   On: 08/08/2014 14:22    Review of Systems  Constitutional: Negative.   HENT: Negative.   Skin: Negative.    Blood pressure 166/73, pulse 89, temperature 98 F (36.7 C), temperature source Oral, resp. rate 20, SpO2 99 %. Physical Exam  Constitutional: She appears well-developed and well-nourished.  HENT:  Awake and alert.there is a linear laceration of the right cheek. She is numb in the right side of her face along the nose and right upper lip. Right eye has some conjunctival hemorrhage and  there is surrounding swelling. There is erythema to the swelling. Her right pupil is dilated. She doesn't appear to have any entrapment or subjective diplopia. Nose is clear.no evidence of acute trauma. Septum is without hematoma area Oral cavity/oropharynx-no evidence of lesions or acute injury. Neck is without swelling or adenopathy  Neck: Normal range of motion. Neck supple.    Assessment/Plan: Right orbital floor fracture-this does appear to be a large trapdoor deformity. This may need repair secondary to the volume and thus the risk of endophthalmos.  We discussed this issue. She doesn't appear to have any diplopia. She will follow-up in my office in about 5 days to allow more the swelling to decrease and reexamined the eye. Regarding the proptosis that is a ophthalmologic issue to be addressed by Dr. Talbert Forest as I would not expect that a orbital floor fracture would cause proptosis but rather just the opposite. She instructed not to blow her nose. The laceration to be closed by the emergency room.  Melissa Montane 08/08/2014, 6:03 PM

## 2014-08-08 NOTE — Telephone Encounter (Signed)
Pt had 1" + cut under rt eye when slipped on leaves this morning and fell and hit step at pts home; pt said she was not unconscious at any time. Pt advised me that she had cataract surgery earlier this morning; spoke with Dr Lorelei Pont and pt and friend was advised need to take pt to ED for eval and treatment and to contact the opthomologist that did surgery.Offered to call ambulance for transport if needed but pts friend said she would take pt to [facility] ED and would contact eye dr. Karena Addison applied dressing to cut and I assisted pt to car in w/c.

## 2014-08-08 NOTE — Discharge Instructions (Signed)
1. No blowing nose 2. Call to get appointment in 5 days to get stitches out 3. Keep cut clean and dry with soap and water 4. Come back if worsening symptoms Facial Fracture A facial fracture is a break in one of the bones of your face. HOME CARE INSTRUCTIONS   Protect the injured part of your face until it is healed.  Do not participate in activities which give chance for re-injury until your doctor approves.  Gently wash and dry your face.  Wear head and facial protection while riding a bicycle, motorcycle, or snowmobile. SEEK MEDICAL CARE IF:   An oral temperature above 102 F (38.9 C) develops.  You have severe headaches or notice changes in your vision.  You have new numbness or tingling in your face.  You develop nausea (feeling sick to your stomach), vomiting or a stiff neck. SEEK IMMEDIATE MEDICAL CARE IF:   You develop difficulty seeing or experience double vision.  You become dizzy, lightheaded, or faint.  You develop trouble speaking, breathing, or swallowing.  You have a watery discharge from your nose or ear. MAKE SURE YOU:   Understand these instructions.  Will watch your condition.  Will get help right away if you are not doing well or get worse. Document Released: 09/23/2005 Document Revised: 12/16/2011 Document Reviewed: 05/12/2008 [facility] Patient Information 2015 [place], [place]. This information is not intended to replace advice given to you by your health care provider. Make sure you discuss any questions you have with your health care provider.

## 2014-09-07 ENCOUNTER — Ambulatory Visit (HOSPITAL_COMMUNITY): Admission: RE | Admit: 2014-09-07 | Payer: Medicare Other | Source: Ambulatory Visit | Admitting: Otolaryngology

## 2014-09-07 ENCOUNTER — Encounter (HOSPITAL_COMMUNITY): Admission: RE | Payer: Self-pay | Source: Ambulatory Visit

## 2014-09-07 SURGERY — OPEN REDUCTION INTERNAL FIXATION (ORIF) ORBITAL FRACTURE
Anesthesia: General | Laterality: Right

## 2014-09-14 ENCOUNTER — Telehealth: Payer: Self-pay

## 2014-09-14 ENCOUNTER — Other Ambulatory Visit: Payer: Self-pay | Admitting: Family Medicine

## 2014-09-14 MED ORDER — ONETOUCH ULTRASOFT LANCETS MISC: Status: DC

## 2014-09-14 MED ORDER — GLUCOSE BLOOD VI STRP: ORAL_STRIP | Status: DC

## 2014-09-14 MED ORDER — ONETOUCH ULTRA SYSTEM W/DEVICE KIT: 1.0000 | PACK | Freq: Once | Status: DC

## 2014-09-14 NOTE — Telephone Encounter (Signed)
Prescription on One Touch Ultra Kit, Test Strips & Lancets sent to [facility].

## 2014-09-14 NOTE — Telephone Encounter (Signed)
Last office visit 08/05/2014.  Last refilled 10/26/2013 for #60 with 5 refills.  Ok to refill?

## 2014-09-14 NOTE — Telephone Encounter (Signed)
Amy at [facility] left v/m; pt had requested a diabetic meter, test strips and lancets from [facility]; pt had discussed with Dr Diona Browner previously.Please advise.pt last seen 08/05/14.

## 2014-10-31 ENCOUNTER — Telehealth: Payer: Self-pay | Admitting: Family Medicine

## 2014-10-31 DIAGNOSIS — E782 Mixed hyperlipidemia: Secondary | ICD-10-CM

## 2014-10-31 DIAGNOSIS — E119 Type 2 diabetes mellitus without complications: Secondary | ICD-10-CM

## 2014-10-31 NOTE — Telephone Encounter (Signed)
-----   Message from Ellamae Sia sent at 10/27/2014  5:30 PM EST ----- Regarding: Lab orders for Tuesday, 1.26.16 Lab orders for a 3 month f/u

## 2014-11-01 ENCOUNTER — Other Ambulatory Visit (INDEPENDENT_AMBULATORY_CARE_PROVIDER_SITE_OTHER): Payer: Medicare Other

## 2014-11-01 DIAGNOSIS — E782 Mixed hyperlipidemia: Secondary | ICD-10-CM | POA: Diagnosis not present

## 2014-11-01 DIAGNOSIS — E119 Type 2 diabetes mellitus without complications: Secondary | ICD-10-CM | POA: Diagnosis not present

## 2014-11-01 LAB — COMPREHENSIVE METABOLIC PANEL
ALT: 33 U/L (ref 0–35)
AST: 33 U/L (ref 0–37)
Albumin: 4.5 g/dL (ref 3.5–5.2)
Alkaline Phosphatase: 65 U/L (ref 39–117)
BUN: 9 mg/dL (ref 6–23)
CO2: 27 mEq/L (ref 19–32)
Calcium: 9.9 mg/dL (ref 8.4–10.5)
Chloride: 99 mEq/L (ref 96–112)
Creatinine, Ser: 0.7 mg/dL (ref 0.40–1.20)
GFR: 88.92 mL/min (ref >60.00)
Glucose, Bld: 143 mg/dL — ABNORMAL HIGH (ref 70–99)
Potassium: 4.3 mEq/L (ref 3.5–5.1)
Sodium: 134 mEq/L — ABNORMAL LOW (ref 135–145)
Total Bilirubin: 0.5 mg/dL (ref 0.2–1.2)
Total Protein: 7.5 g/dL (ref 6.0–8.3)

## 2014-11-01 LAB — LIPID PANEL
Cholesterol: 122 mg/dL (ref 0–200)
HDL: 44.6 mg/dL (ref >39.00)
NonHDL: 77.4
Total CHOL/HDL Ratio: 3
Triglycerides: 306 mg/dL — ABNORMAL HIGH (ref 0.0–149.0)
VLDL: 61.2 mg/dL — ABNORMAL HIGH (ref 0.0–40.0)

## 2014-11-01 LAB — HEMOGLOBIN A1C: Hgb A1c MFr Bld: 8 % — ABNORMAL HIGH (ref 4.6–6.5)

## 2014-11-01 LAB — LDL CHOLESTEROL, DIRECT: Direct LDL: 48 mg/dL

## 2014-11-03 ENCOUNTER — Other Ambulatory Visit: Payer: Self-pay | Admitting: Family Medicine

## 2014-11-08 ENCOUNTER — Ambulatory Visit (INDEPENDENT_AMBULATORY_CARE_PROVIDER_SITE_OTHER): Payer: Medicare Other | Admitting: Family Medicine

## 2014-11-08 ENCOUNTER — Encounter: Payer: Self-pay | Admitting: Family Medicine

## 2014-11-08 VITALS — BP 130/70 | HR 78 | Temp 97.4°F | Ht 62.0 in | Wt 146.2 lb

## 2014-11-08 DIAGNOSIS — E782 Mixed hyperlipidemia: Secondary | ICD-10-CM

## 2014-11-08 DIAGNOSIS — IMO0002 Reserved for concepts with insufficient information to code with codable children: Secondary | ICD-10-CM

## 2014-11-08 DIAGNOSIS — E1141 Type 2 diabetes mellitus with diabetic mononeuropathy: Secondary | ICD-10-CM

## 2014-11-08 DIAGNOSIS — E1149 Type 2 diabetes mellitus with other diabetic neurological complication: Secondary | ICD-10-CM

## 2014-11-08 DIAGNOSIS — E1165 Type 2 diabetes mellitus with hyperglycemia: Secondary | ICD-10-CM

## 2014-11-08 LAB — HM DIABETES FOOT EXAM

## 2014-11-08 NOTE — Assessment & Plan Note (Signed)
Continue metformin and glipizide. Get back on track with activity and exercise. Work on weight loss. Re-eval in 3 months.

## 2014-11-08 NOTE — Progress Notes (Signed)
Pre visit review using our clinic review tool, if applicable. No additional management support is needed unless otherwise documented below in the visit note. 

## 2014-11-08 NOTE — Progress Notes (Signed)
69 year old female presents for DM follow up 3 months.  Elevated Cholesterol: LDL now at goal but  trig very high. On crestorLDL goal < 100.  Has stopped frying. Lab Results  Component Value Date   CHOL 122 11/01/2014   HDL 44.60 11/01/2014   LDLCALC 26 12/17/2013   LDLDIRECT 48.0 11/01/2014   TRIG 306.0* 11/01/2014   CHOLHDL 3 11/01/2014  Using medications without problems:None  Muscle aches: None  Diet compliance:Moderate  Exercise: none Other complaints:   Diabetes: Worsened control on Metformin 500 mg twice daily due to SE at higher dose. Occ forgets glipizide. She had orbital fracture after fall from slipping. Had orbital fracture. Stiches placed. She has been less active since. She refused surgery to repair. Lab Results  Component Value Date   HGBA1C 8.0* 11/01/2014  Using medications without difficulties:  Hypoglycemic episodes:None  Hyperglycemic episodes:yes Feet problems:neuropathy, no ulcers  Uptodate with eye exam , had cataract surgery  Blood Sugars averaging: checking twice a day FBS 300, 2 hours after meals 200  Wt Readings from Last 3 Encounters:  11/08/14 146 lb 4 oz (66.339 kg)  08/05/14 142 lb 4 oz (64.524 kg)  05/31/14 133 lb 12 oz (60.669 kg)   Review of Systems  Constitutional: Negative for fever and fatigue.  HENT: Negative for ear pain.  Eyes: Negative for pain.  Respiratory: Negative for chest tightness and shortness of breath.  Cardiovascular: Negative for chest pain, palpitations and leg swelling.  Gastrointestinal: Negative for abdominal pain.  Genitourinary: Negative for dysuria.  Objective:   Physical Exam  Constitutional: Vital signs are normal. She appears well-developed and well-nourished. She is cooperative. Non-toxic appearance. She does not appear ill. No distress.  HENT:  Head: Normocephalic.  Right Ear: Hearing, tympanic membrane, external ear and ear canal normal.  Left Ear: Hearing, tympanic membrane, external  ear and ear canal normal.  Nose: Nose normal.  Eyes: Conjunctivae, EOM and lids are normal. Pupils are equal, round, and reactive to light. Lids are everted and swept, no foreign bodies found.  Neck: Trachea normal and normal range of motion. Neck supple. Carotid bruit is not present. No mass and no thyromegaly present.  Cardiovascular: Normal rate, regular rhythm, S1 normal, S2 normal, normal heart sounds and intact distal pulses. Exam reveals no gallop.  No murmur heard.  Pulmonary/Chest: Effort normal and breath sounds normal. No respiratory distress. She has no wheezes. She has no rhonchi. She has no rales.  Abdominal: Soft. Normal appearance and bowel sounds are normal. She exhibits no distension, no fluid wave, no abdominal bruit and no mass. There is no hepatosplenomegaly. There is no tenderness. There is no rebound, no guarding and no CVA tenderness. No hernia.  Lymphadenopathy:  She has no cervical adenopathy.  She has no axillary adenopathy.  Neurological: She is alert. She has normal strength. No cranial nerve deficit or sensory deficit.  Skin: Skin is warm, dry and intact. No rash noted.  Psychiatric: Her speech is normal and behavior is normal. Judgment normal. Her mood appears not anxious. Cognition and memory are normal. She does not exhibit a depressed mood.   Diabetic foot exam: Normal inspection No skin breakdown No calluses  Normal DP pulses Normal sensation to light touch and monofilament Nails thickened and turned in

## 2014-11-08 NOTE — Assessment & Plan Note (Signed)
Now back at goal on crestor.

## 2014-11-08 NOTE — Patient Instructions (Addendum)
Get back to regular exercise. Get more active!  Keep working on low carb and low cholesterol diet.  Call if even after exercise blood sugars fasting are 200-300.

## 2015-01-02 ENCOUNTER — Other Ambulatory Visit: Payer: Self-pay | Admitting: Family Medicine

## 2015-01-02 NOTE — Telephone Encounter (Signed)
Last office visit 11/08/2014.  Last refilled 04/13/2014 for #180 with 1 refill.  Ok to refill?

## 2015-01-19 DIAGNOSIS — S61219A Laceration without foreign body of unspecified finger without damage to nail, initial encounter: Secondary | ICD-10-CM | POA: Diagnosis not present

## 2015-01-20 ENCOUNTER — Encounter: Payer: Self-pay | Admitting: Family Medicine

## 2015-01-20 ENCOUNTER — Ambulatory Visit (INDEPENDENT_AMBULATORY_CARE_PROVIDER_SITE_OTHER): Payer: Medicare Other | Admitting: Family Medicine

## 2015-01-20 ENCOUNTER — Telehealth: Payer: Self-pay | Admitting: Family Medicine

## 2015-01-20 VITALS — BP 130/64 | HR 78 | Temp 97.8°F | Ht 62.0 in | Wt 137.2 lb

## 2015-01-20 DIAGNOSIS — L089 Local infection of the skin and subcutaneous tissue, unspecified: Secondary | ICD-10-CM

## 2015-01-20 DIAGNOSIS — E1141 Type 2 diabetes mellitus with diabetic mononeuropathy: Secondary | ICD-10-CM

## 2015-01-20 DIAGNOSIS — S61412A Laceration without foreign body of left hand, initial encounter: Secondary | ICD-10-CM | POA: Diagnosis not present

## 2015-01-20 DIAGNOSIS — E1165 Type 2 diabetes mellitus with hyperglycemia: Secondary | ICD-10-CM

## 2015-01-20 DIAGNOSIS — E1149 Type 2 diabetes mellitus with other diabetic neurological complication: Secondary | ICD-10-CM

## 2015-01-20 DIAGNOSIS — IMO0002 Reserved for concepts with insufficient information to code with codable children: Secondary | ICD-10-CM

## 2015-01-20 DIAGNOSIS — S91302A Unspecified open wound, left foot, initial encounter: Secondary | ICD-10-CM | POA: Diagnosis not present

## 2015-01-20 MED ORDER — AMOXICILLIN-POT CLAVULANATE 875-125 MG PO TABS: 1.0000 | ORAL_TABLET | Freq: Two times a day (BID) | ORAL | Status: DC

## 2015-01-20 NOTE — Patient Instructions (Signed)
Start and complete Augmentin twice daily x 10 days.  Follow up with Dr. Diona Browner on Tuesday, work in. Wash both areas with warm and soap water 1-2 times a day.  Apply triple antibiotic ointment daily. Keep lesion on foot covered with gauze.

## 2015-01-20 NOTE — Progress Notes (Signed)
   Subjective:    Patient ID: Cindy Burns, female    DOB: [birth year], 69 y.o.   MRN: [id number]  HPI   69 year old female  with poorly controlled DM presents following visit to urgent care on 4/14 for laceration on left hand. She cut  Hand with a cat food container at webspace between 1 and 2nd digits.  She applied steri strips, no sutures. Told to not move much, she has thumb and pointer finger taped together.  Also has draining sore on left foot, new in last 24 hours. At base of second digit. Started as a blood blister, She drained it with a blood glucose needle.  Applied hydrogen peroxide.  Now today macerated tissue, area is smaller since blood came out, pain with walking. No fever. Mo redness surrounding. Clear fluids draining. Some mild pain on top of foot.  Diabetes improved now. On glipizide, has been improving diet. FBS: 124 Lab Results  Component Value Date   HGBA1C 8.0* 11/01/2014         Review of Systems  Constitutional: Negative for fatigue.  HENT: Negative for ear pain.   Eyes: Negative for pain.  Respiratory: Negative for shortness of breath.   Cardiovascular: Negative for chest pain and leg swelling.       Objective:   Physical Exam  Constitutional: Vital signs are normal. She appears well-developed and well-nourished. She is cooperative.  Non-toxic appearance. She does not appear ill. No distress.  HENT:  Head: Normocephalic.  Right Ear: Hearing, tympanic membrane, external ear and ear canal normal. Tympanic membrane is not erythematous, not retracted and not bulging.  Left Ear: Hearing, tympanic membrane, external ear and ear canal normal. Tympanic membrane is not erythematous, not retracted and not bulging.  Nose: No mucosal edema or rhinorrhea. Right sinus exhibits no maxillary sinus tenderness and no frontal sinus tenderness. Left sinus exhibits no maxillary sinus tenderness and no frontal sinus tenderness.  Mouth/Throat: Uvula is midline,  oropharynx is clear and moist and mucous membranes are normal.  Eyes: Conjunctivae, EOM and lids are normal. Pupils are equal, round, and reactive to light. Lids are everted and swept, no foreign bodies found.  Neck: Trachea normal and normal range of motion. Neck supple. Carotid bruit is not present. No thyroid mass and no thyromegaly present.  Cardiovascular: Normal rate, regular rhythm, S1 normal, S2 normal, normal heart sounds, intact distal pulses and normal pulses.  Exam reveals no gallop and no friction rub.   No murmur heard. Pulmonary/Chest: Effort normal and breath sounds normal. No tachypnea. No respiratory distress. She has no decreased breath sounds. She has no wheezes. She has no rhonchi. She has no rales.  Abdominal: Soft. Normal appearance and bowel sounds are normal. There is no tenderness.  Neurological: She is alert.  Skin: Skin is warm, dry and intact. Lesion noted. No rash noted.  Left hand with laceration at web space between 1 and 2nd digit about 3 cm long, partially healed, no wound edge to approximate, pus in wound when steri strips removed.  Left foot: base of left 2nd digit,  Ragged opening, macerated tissue, no surrounding erythema.  Psychiatric: Her speech is normal and behavior is normal. Judgment and thought content normal. Her mood appears not anxious. Cognition and memory are normal. She does not exhibit a depressed mood.          Assessment & Plan:

## 2015-01-20 NOTE — Assessment & Plan Note (Addendum)
Area cleaned, old steri strips removes.  Will treat with antibiotics to cover for infection in diabetic. Will heal by secondary intention. One steristrip place to keep lesion for repeated reopening.  Close follow up.

## 2015-01-20 NOTE — Telephone Encounter (Signed)
Patient Name: Cindy Burns  DOB: 06-22-1946    Initial Comment Caller states she has a sore in between her toes and she has diabetes.    Nurse Assessment  Nurse: Verlin Fester RN, Stanton Kidney Date/Time Eilene Ghazi Time): 01/20/2015 11:02:52 AM  Confirm and document reason for call. If symptomatic, describe symptoms. ---Patient states she has a sore between the left great toe and second toe and she lanced it and got the pus and stuff out of it.  Has the patient traveled out of the country within the last 30 days? ---No  Does the patient require triage? ---Yes  Related visit to physician within the last 2 weeks? ---No  Does the PT have any chronic conditions? (i.e. diabetes, asthma, etc.) ---Yes  List chronic conditions. ---"COPD, diabetes,     Guidelines    Guideline Title Affirmed Question Affirmed Notes  Diabetes - Foot Problems and Questions [1] Drainage from foot AND [2] new or increased    Final Disposition User   See Physician within 4 Hours (or PCP triage) Verlin Fester, RN, Stanton Kidney

## 2015-01-20 NOTE — Assessment & Plan Note (Signed)
No clear infeciton. Keep covered . Was and apply antibiotic pintment. Close follow up.

## 2015-01-20 NOTE — Progress Notes (Signed)
Pre visit review using our clinic review tool, if applicable. No additional management support is needed unless otherwise documented below in the visit note. 

## 2015-01-20 NOTE — Telephone Encounter (Signed)
Pt has appt 01/20/15 at 2:30 with Dr Diona Browner.

## 2015-01-20 NOTE — Assessment & Plan Note (Signed)
Improved control. Contnue improvement of CBGs to help with wound healing.

## 2015-01-24 ENCOUNTER — Ambulatory Visit (INDEPENDENT_AMBULATORY_CARE_PROVIDER_SITE_OTHER): Payer: Medicare Other | Admitting: Family Medicine

## 2015-01-24 ENCOUNTER — Encounter: Payer: Self-pay | Admitting: Family Medicine

## 2015-01-24 VITALS — BP 120/54 | HR 107 | Temp 98.8°F | Ht 62.0 in | Wt 138.2 lb

## 2015-01-24 DIAGNOSIS — S61412A Laceration without foreign body of left hand, initial encounter: Secondary | ICD-10-CM

## 2015-01-24 DIAGNOSIS — S91302A Unspecified open wound, left foot, initial encounter: Secondary | ICD-10-CM

## 2015-01-24 DIAGNOSIS — L089 Local infection of the skin and subcutaneous tissue, unspecified: Secondary | ICD-10-CM | POA: Diagnosis not present

## 2015-01-24 NOTE — Progress Notes (Signed)
   Subjective:    Patient ID: Cindy Burns, female    DOB: [birth year], 69 y.o.   MRN: [id number]  HPI   69 year old female presents for 4 day follow up of infected laceration in right hand.  Started on  Augmentin on 4/15. Pt also had seperate lesion on right toe interspace   She reports less pain, less redness in both foot and hand wound. Steri strips have washed off.  No fever. Feeling well otherwsre. NO SE to antibiotics.  Also applying topical antibiotics.    Review of Systems  Constitutional: Negative for fever and fatigue.  Respiratory: Negative for cough.   Cardiovascular: Negative for chest pain.  Gastrointestinal: Negative for abdominal pain.       Objective:   Physical Exam  Constitutional: Vital signs are normal. She appears well-developed and well-nourished. She is cooperative.  Non-toxic appearance. She does not appear ill. No distress.  HENT:  Head: Normocephalic.  Right Ear: Hearing, tympanic membrane, external ear and ear canal normal. Tympanic membrane is not erythematous, not retracted and not bulging.  Left Ear: Hearing, tympanic membrane, external ear and ear canal normal. Tympanic membrane is not erythematous, not retracted and not bulging.  Nose: No mucosal edema or rhinorrhea. Right sinus exhibits no maxillary sinus tenderness and no frontal sinus tenderness. Left sinus exhibits no maxillary sinus tenderness and no frontal sinus tenderness.  Mouth/Throat: Uvula is midline, oropharynx is clear and moist and mucous membranes are normal.  Neck: Trachea normal and normal range of motion. Neck supple. Carotid bruit is present.  Cardiovascular: Normal rate, regular rhythm, S1 normal, S2 normal, normal heart sounds, intact distal pulses and normal pulses.  Exam reveals no gallop and no friction rub.   No murmur heard. Pulmonary/Chest: Effort normal and breath sounds normal. No tachypnea. No respiratory distress. She has no decreased breath sounds. She has no  wheezes. She has no rhonchi. She has no rales.  Abdominal: There is no tenderness.  Neurological: She is alert.  Skin: Skin is warm, dry and intact. Lesion noted. No rash noted.  Left hand with laceration at web space between 1 and 2nd digit about 3 cm long, partially healed, no pus and less redness surrounding, wound edge still far from approximated and steri strips off.  Left foot: base of left 2nd digit,  Ragged opening, macerated tissue, no surrounding erythema.  Psychiatric: Her speech is normal and behavior is normal. Judgment and thought content normal. Her mood appears not anxious. Cognition and memory are normal. She does not exhibit a depressed mood.          Assessment & Plan:

## 2015-01-24 NOTE — Assessment & Plan Note (Signed)
Improving with washing, topical antibiotics and bandaid. Continue to follow. Pt aware of reasons to call including failure to heal.

## 2015-01-24 NOTE — Assessment & Plan Note (Signed)
On augmentin. Improving, less redness, no fever, less pain.

## 2015-01-24 NOTE — Progress Notes (Signed)
Pre visit review using our clinic review tool, if applicable. No additional management support is needed unless otherwise documented below in the visit note. 

## 2015-01-24 NOTE — Patient Instructions (Signed)
Continue antibiotics and make sure to complete.  Call if redness spreading around wounds or fever.  Call also if lesion not healing as expected.

## 2015-01-29 NOTE — Op Note (Signed)
PATIENT NAME:  Cindy Burns, Cindy Burns MR#:  [id number] DATE OF BIRTH:  [birth year]  DATE OF PROCEDURE:  01/16/2012  PREOPERATIVE DIAGNOSIS: Acute cholecystitis.   POSTOPERATIVE DIAGNOSIS: Acute cholecystitis.   PROCEDURE PERFORMED: Laparoscopic cholecystectomy with cholangiogram.   SURGEON:  Vella Kohler, MD   ANESTHESIA:  General.   INDICATION: This patient was brought to surgery for right upper quadrant abdominal pain and was found to have gallstones. Her liver functions were preoperatively normal. The patient was then brought to surgery.   DESCRIPTION OF PROCEDURE:  Under general anesthesia, the abdomen was then prepped and draped. A small incision was made above the umbilicus. After cutting skin and subcutaneous tissue, the fascia was cut. The abdomen was entered with direct vision. The patient did have a lot of adhesions in the umbilical area due to previous surgery. Care was taken not to injure the bowel. After we entered the abdomen, the trocar was inserted. Another trocar was put into the epigastric region, two 5 mm put in the right upper quadrant of the abdomen. Once we entered the abdomen, the gallbladder was very large, distended and the left lobe of the liver was very big. I had to retract the left lobe of liver, so I used another 5 mm trocar and put a liver retractor in to find the place where the cystic artery and the cystic duct was. First of all, the cystic artery was then clipped and cut. Cystic duct was isolated very nicely. Cholangiogram was then taken by Kumar clamp and it showed normal common bile duct at this time. After this was done, the cystic duct was then clipped and cut and the gallbladder was removed from the liver and taken out from the umbilical port. After this was done, all ports were then visualized with a camera to make sure there was no bleeding. Umbilical port was closed with interrupted 0 Vicryl sutures. The subcuticular suturing applied in the skin and subcutaneous  tissue. Steri-Strips were applied.      The patient tolerated the procedure well and was sent to the recovery room in satisfactory condition.   ____________________________ Welford Roche Phylis Bougie, MD msh:ap D: 01/16/2012 13:01:43 ET T: 01/16/2012 13:21:40 ET JOB#: [id number]  cc: Keaira Whitehurst S. Phylis Bougie, MD, <Dictator> Meindert A. Brunetta Genera, MD Sharene Butters MD ELECTRONICALLY SIGNED 01/20/2012 17:16

## 2015-01-30 ENCOUNTER — Telehealth: Payer: Self-pay | Admitting: Family Medicine

## 2015-01-30 ENCOUNTER — Other Ambulatory Visit: Payer: Self-pay | Admitting: Family Medicine

## 2015-01-30 DIAGNOSIS — E1143 Type 2 diabetes mellitus with diabetic autonomic (poly)neuropathy: Secondary | ICD-10-CM

## 2015-01-30 NOTE — Telephone Encounter (Signed)
-----   Message from Ellamae Sia sent at 01/30/2015  8:21 AM EDT ----- Regarding: Lab orders for Tuesday, 4.26.16 Labs orders for a 3 month f/u

## 2015-01-31 ENCOUNTER — Other Ambulatory Visit (INDEPENDENT_AMBULATORY_CARE_PROVIDER_SITE_OTHER): Payer: Medicare Other

## 2015-01-31 DIAGNOSIS — E1143 Type 2 diabetes mellitus with diabetic autonomic (poly)neuropathy: Secondary | ICD-10-CM | POA: Diagnosis not present

## 2015-01-31 LAB — COMPREHENSIVE METABOLIC PANEL
ALT: 23 U/L (ref 0–35)
AST: 27 U/L (ref 0–37)
Albumin: 4 g/dL (ref 3.5–5.2)
Alkaline Phosphatase: 58 U/L (ref 39–117)
BUN: 5 mg/dL — ABNORMAL LOW (ref 6–23)
CO2: 29 mEq/L (ref 19–32)
Calcium: 9.3 mg/dL (ref 8.4–10.5)
Chloride: 104 mEq/L (ref 96–112)
Creatinine, Ser: 0.72 mg/dL (ref 0.40–1.20)
GFR: 86.01 mL/min (ref >60.00)
Glucose, Bld: 138 mg/dL — ABNORMAL HIGH (ref 70–99)
Potassium: 4 mEq/L (ref 3.5–5.1)
Sodium: 139 mEq/L (ref 135–145)
Total Bilirubin: 0.4 mg/dL (ref 0.2–1.2)
Total Protein: 6.4 g/dL (ref 6.0–8.3)

## 2015-01-31 LAB — LIPID PANEL
Cholesterol: 82 mg/dL (ref 0–200)
HDL: 31.5 mg/dL — ABNORMAL LOW (ref >39.00)
NonHDL: 50.5
Total CHOL/HDL Ratio: 3
Triglycerides: 202 mg/dL — ABNORMAL HIGH (ref 0.0–149.0)
VLDL: 40.4 mg/dL — ABNORMAL HIGH (ref 0.0–40.0)

## 2015-01-31 LAB — HEMOGLOBIN A1C: Hgb A1c MFr Bld: 7.6 % — ABNORMAL HIGH (ref 4.6–6.5)

## 2015-01-31 LAB — LDL CHOLESTEROL, DIRECT: Direct LDL: 28 mg/dL

## 2015-02-07 ENCOUNTER — Encounter: Payer: Self-pay | Admitting: Family Medicine

## 2015-02-07 ENCOUNTER — Ambulatory Visit (INDEPENDENT_AMBULATORY_CARE_PROVIDER_SITE_OTHER): Payer: Medicare Other | Admitting: Family Medicine

## 2015-02-07 VITALS — BP 120/60 | HR 83 | Temp 98.1°F | Ht 62.0 in | Wt 135.0 lb

## 2015-02-07 DIAGNOSIS — E782 Mixed hyperlipidemia: Secondary | ICD-10-CM

## 2015-02-07 DIAGNOSIS — IMO0002 Reserved for concepts with insufficient information to code with codable children: Secondary | ICD-10-CM

## 2015-02-07 DIAGNOSIS — E1149 Type 2 diabetes mellitus with other diabetic neurological complication: Secondary | ICD-10-CM

## 2015-02-07 DIAGNOSIS — F331 Major depressive disorder, recurrent, moderate: Secondary | ICD-10-CM | POA: Diagnosis not present

## 2015-02-07 DIAGNOSIS — E1141 Type 2 diabetes mellitus with diabetic mononeuropathy: Secondary | ICD-10-CM

## 2015-02-07 DIAGNOSIS — E1165 Type 2 diabetes mellitus with hyperglycemia: Secondary | ICD-10-CM

## 2015-02-07 LAB — HM DIABETES FOOT EXAM

## 2015-02-07 MED ORDER — TIOTROPIUM BROMIDE MONOHYDRATE 18 MCG IN CAPS: 18.0000 ug | ORAL_CAPSULE | Freq: Every day | RESPIRATORY_TRACT | Status: DC

## 2015-02-07 MED ORDER — METFORMIN HCL 500 MG PO TABS: 1000.0000 mg | ORAL_TABLET | Freq: Two times a day (BID) | ORAL | Status: DC

## 2015-02-07 MED ORDER — GLIPIZIDE ER 10 MG PO TB24: 10.0000 mg | ORAL_TABLET | Freq: Every day | ORAL | Status: DC

## 2015-02-07 MED ORDER — VENLAFAXINE HCL ER 37.5 MG PO CP24: 37.5000 mg | ORAL_CAPSULE | Freq: Every day | ORAL | Status: DC

## 2015-02-07 MED ORDER — ROSUVASTATIN CALCIUM 40 MG PO TABS: 40.0000 mg | ORAL_TABLET | Freq: Every day | ORAL | Status: DC

## 2015-02-07 NOTE — Assessment & Plan Note (Signed)
Pt states spiriva does not seem to be helping as much with SOB as [previous. Pt continues to try to quit smoking. She does not have time today for spirometry, will recheck at later date.  Will refill for now, may need to add symbicort or breo to treat if FEV1/FVC decreased.

## 2015-02-07 NOTE — Patient Instructions (Addendum)
Continue working on low carb diet.  Increase exercise and activity. Increase metformin to 2 tabs twice daily for diabetes .  Continue glipizide and crestor.  Schedule follow up in 3 months with A1C prior.

## 2015-02-07 NOTE — Assessment & Plan Note (Signed)
INadequate control. Increase metformin to 2 tabs twice daily and work on lifestyle changes.

## 2015-02-07 NOTE — Assessment & Plan Note (Signed)
Improved and LDL now at goal on crestor daily.

## 2015-02-07 NOTE — Progress Notes (Signed)
Pre visit review using our clinic review tool, if applicable. No additional management support is needed unless otherwise documented below in the visit note. 

## 2015-02-07 NOTE — Progress Notes (Signed)
   Subjective:    Patient ID: Cindy Burns, female    DOB: 05-May-1946, 69 y.o.   MRN: [id number]  HPI   69 year old female presents for 3 months follow up.  Diabetes:   Improving on metformin and glipizide Lab Results  Component Value Date   HGBA1C 7.6* 01/31/2015  Using medications without difficulties: Hypoglycemic episodes:None Hyperglycemic episodes:occ Feet problems: Has healed lesion on left foot,. See previous OV. Still discolored. Blood Sugars averaging: FBS 120-147, at bedtime 245 eye exam within last year: yes   Elevated Cholesterol:  LDL at goal  Now < 100 on crestor, trig remain high Lab Results  Component Value Date   CHOL 82 01/31/2015   HDL 31.50* 01/31/2015   LDLCALC 26 12/17/2013   LDLDIRECT 28.0 01/31/2015   TRIG 202.0* 01/31/2015   CHOLHDL 3 01/31/2015  Using medications without problems: Muscle aches:  Diet compliance: moderate Exercise: Gardening a lot Other complaints:   Mild COPD  Last spirometry: 2009 FEV1/FVC 68%  05/2014   nml after starting spiriva 72 % FEV/FVC SE of bad taste in mouth with Advair.,  Spiriva does not seem to be helping with COPD.    Review of Systems  Constitutional: Negative for fever and fatigue.  HENT: Negative for ear pain.   Eyes: Negative for pain.  Respiratory: Negative for chest tightness and shortness of breath.   Cardiovascular: Negative for chest pain, palpitations and leg swelling.  Gastrointestinal: Negative for abdominal pain.  Genitourinary: Negative for dysuria.       Objective:   Physical Exam  Constitutional: Vital signs are normal. She appears well-developed and well-nourished. She is cooperative.  Non-toxic appearance. She does not appear ill. No distress.  HENT:  Head: Normocephalic.  Right Ear: Hearing, tympanic membrane, external ear and ear canal normal. Tympanic membrane is not erythematous, not retracted and not bulging.  Left Ear: Hearing, tympanic membrane, external ear and ear canal  normal. Tympanic membrane is not erythematous, not retracted and not bulging.  Nose: No mucosal edema or rhinorrhea. Right sinus exhibits no maxillary sinus tenderness and no frontal sinus tenderness. Left sinus exhibits no maxillary sinus tenderness and no frontal sinus tenderness.  Mouth/Throat: Uvula is midline, oropharynx is clear and moist and mucous membranes are normal.  Eyes: Conjunctivae, EOM and lids are normal. Pupils are equal, round, and reactive to light. Lids are everted and swept, no foreign bodies found.  Neck: Trachea normal and normal range of motion. Neck supple. Carotid bruit is not present. No thyroid mass and no thyromegaly present.  Cardiovascular: Normal rate, regular rhythm, S1 normal, S2 normal, normal heart sounds, intact distal pulses and normal pulses.  Exam reveals no gallop and no friction rub.   No murmur heard. Pulmonary/Chest: Effort normal. No tachypnea. No respiratory distress. She has no decreased breath sounds. She has wheezes in the right lower field. She has no rhonchi. She has no rales.  Abdominal: Soft. Normal appearance and bowel sounds are normal. There is no tenderness.  Neurological: She is alert.  Skin: Skin is warm, dry and intact. No rash noted.  Psychiatric: Her speech is normal and behavior is normal. Judgment and thought content normal. Her mood appears not anxious. Cognition and memory are normal. She does not exhibit a depressed mood.    Diabetic foot exam: Normal inspection No skin breakdown No calluses  Normal DP pulses Normal sensation to light touch and monofilament Nails normal       Assessment & Plan:

## 2015-02-07 NOTE — Assessment & Plan Note (Signed)
Stable on venlafaxine.

## 2015-02-17 ENCOUNTER — Encounter: Payer: Self-pay | Admitting: Family Medicine

## 2015-02-24 ENCOUNTER — Other Ambulatory Visit: Payer: Self-pay | Admitting: Family Medicine

## 2015-02-27 ENCOUNTER — Other Ambulatory Visit: Payer: Self-pay | Admitting: Family Medicine

## 2015-04-07 ENCOUNTER — Telehealth: Payer: Self-pay | Admitting: *Deleted

## 2015-04-07 NOTE — Telephone Encounter (Signed)
Received fax from [facility] requesting order for Diabetic Testing Supplies.  I spoke with Ms. Cindy Burns and she does not wish to get diabetic testing supplies from this company.  Form faxed back to [facility] denying this request at [phone].

## 2015-04-27 ENCOUNTER — Telehealth: Payer: Self-pay

## 2015-04-27 ENCOUNTER — Encounter: Payer: Self-pay | Admitting: Family Medicine

## 2015-04-27 NOTE — Telephone Encounter (Signed)
Called patient to notify her of being due for a mammogram. Patient declined any help scheduling one at the moment. 

## 2015-05-04 ENCOUNTER — Telehealth: Payer: Self-pay | Admitting: Family Medicine

## 2015-05-04 DIAGNOSIS — E1165 Type 2 diabetes mellitus with hyperglycemia: Secondary | ICD-10-CM

## 2015-05-04 DIAGNOSIS — E782 Mixed hyperlipidemia: Secondary | ICD-10-CM

## 2015-05-04 DIAGNOSIS — D649 Anemia, unspecified: Secondary | ICD-10-CM

## 2015-05-04 DIAGNOSIS — E1149 Type 2 diabetes mellitus with other diabetic neurological complication: Secondary | ICD-10-CM

## 2015-05-04 DIAGNOSIS — IMO0002 Reserved for concepts with insufficient information to code with codable children: Secondary | ICD-10-CM

## 2015-05-04 NOTE — Telephone Encounter (Signed)
-----   Message from Ellamae Sia sent at 05/03/2015  6:30 PM EDT ----- Regarding: Lab orders for Wednesday, 8.3.16 Lab orders for a f/u appt

## 2015-05-10 ENCOUNTER — Other Ambulatory Visit (INDEPENDENT_AMBULATORY_CARE_PROVIDER_SITE_OTHER): Payer: Medicare Other

## 2015-05-10 DIAGNOSIS — E1141 Type 2 diabetes mellitus with diabetic mononeuropathy: Secondary | ICD-10-CM | POA: Diagnosis not present

## 2015-05-10 DIAGNOSIS — E1165 Type 2 diabetes mellitus with hyperglycemia: Secondary | ICD-10-CM

## 2015-05-10 DIAGNOSIS — E1149 Type 2 diabetes mellitus with other diabetic neurological complication: Secondary | ICD-10-CM

## 2015-05-10 DIAGNOSIS — IMO0002 Reserved for concepts with insufficient information to code with codable children: Secondary | ICD-10-CM

## 2015-05-10 LAB — HEMOGLOBIN A1C: Hgb A1c MFr Bld: 6.6 % — ABNORMAL HIGH (ref 4.6–6.5)

## 2015-05-13 ENCOUNTER — Other Ambulatory Visit: Payer: Self-pay | Admitting: Family Medicine

## 2015-05-16 ENCOUNTER — Encounter: Payer: Self-pay | Admitting: Family Medicine

## 2015-05-16 ENCOUNTER — Ambulatory Visit (INDEPENDENT_AMBULATORY_CARE_PROVIDER_SITE_OTHER): Payer: Medicare Other | Admitting: Family Medicine

## 2015-05-16 VITALS — BP 128/68 | HR 80 | Temp 97.9°F | Wt 136.0 lb

## 2015-05-16 DIAGNOSIS — R630 Anorexia: Secondary | ICD-10-CM | POA: Diagnosis not present

## 2015-05-16 DIAGNOSIS — J449 Chronic obstructive pulmonary disease, unspecified: Secondary | ICD-10-CM

## 2015-05-16 DIAGNOSIS — E1141 Type 2 diabetes mellitus with diabetic mononeuropathy: Secondary | ICD-10-CM

## 2015-05-16 DIAGNOSIS — E1165 Type 2 diabetes mellitus with hyperglycemia: Secondary | ICD-10-CM

## 2015-05-16 DIAGNOSIS — IMO0002 Reserved for concepts with insufficient information to code with codable children: Secondary | ICD-10-CM

## 2015-05-16 DIAGNOSIS — F331 Major depressive disorder, recurrent, moderate: Secondary | ICD-10-CM

## 2015-05-16 DIAGNOSIS — E1149 Type 2 diabetes mellitus with other diabetic neurological complication: Secondary | ICD-10-CM

## 2015-05-16 LAB — HM DIABETES FOOT EXAM

## 2015-05-16 MED ORDER — IPRATROPIUM BROMIDE HFA 17 MCG/ACT IN AERS: 2.0000 | INHALATION_SPRAY | Freq: Two times a day (BID) | RESPIRATORY_TRACT | Status: DC | PRN

## 2015-05-16 MED ORDER — ROSUVASTATIN CALCIUM 40 MG PO TABS: 40.0000 mg | ORAL_TABLET | Freq: Every day | ORAL | Status: DC

## 2015-05-16 NOTE — Assessment & Plan Note (Signed)
Likely due to med SE from chantix or effexor.

## 2015-05-16 NOTE — Progress Notes (Signed)
Subjective:    Patient ID: Cindy Burns, female    DOB: 03/24/1946, 69 y.o.   MRN: [id number]  HPI   69 year old female presents for 3 months follow up.  Diabetes: Good control  on metformin max and glipizide mg Lab Results  Component Value Date   HGBA1C 6.6* 05/10/2015  Using medications without difficulties: Hypoglycemic episodes:None Hyperglycemic episodes:occ Feet problems: Has healed lesion on left foot,. See previous OV. Still discolored. Blood Sugars averaging: FBS 90, at bedtime 150 eye exam within last year: due   In last 3-4 months less appetite .  No abdominal pain. No fever. Mood moderately well controlled. She feels that this may be causing her decreased appetite. Wt Readings from Last 3 Encounters:  05/16/15 136 lb (61.689 kg)  02/07/15 135 lb (61.236 kg)  01/24/15 138 lb 4 oz (62.71 kg)    Mild COPD Last spirometry: 2009 FEV1/FVC 68% 05/2014 nml after starting spiriva 72 % FEV/FVC SE of bad taste in mouth with Advair., Spiriva does not seem to be helping with COPD much.   She is using chantix ( has been on now in last 2 months) to quits smoking. Stopped smoking July 4 th, restarted  In last 2 weeks.             Review of Systems  Constitutional: Negative for fever and fatigue.  HENT: Negative for ear pain.   Eyes: Negative for pain.  Respiratory: Negative for shortness of breath.   Cardiovascular: Negative for chest pain.       Objective:   Physical Exam  Constitutional: Vital signs are normal. She appears well-developed and well-nourished. She is cooperative.  Non-toxic appearance. She does not appear ill. No distress.  HENT:  Head: Normocephalic.  Right Ear: Hearing, tympanic membrane, external ear and ear canal normal. Tympanic membrane is not erythematous, not retracted and not bulging.  Left Ear: Hearing, tympanic membrane, external ear and ear canal normal. Tympanic membrane is not erythematous, not retracted and not  bulging.  Nose: No mucosal edema or rhinorrhea. Right sinus exhibits no maxillary sinus tenderness and no frontal sinus tenderness. Left sinus exhibits no maxillary sinus tenderness and no frontal sinus tenderness.  Mouth/Throat: Uvula is midline, oropharynx is clear and moist and mucous membranes are normal.  Eyes: Conjunctivae, EOM and lids are normal. Pupils are equal, round, and reactive to light. Lids are everted and swept, no foreign bodies found.  Neck: Trachea normal and normal range of motion. Neck supple. Carotid bruit is not present. No thyroid mass and no thyromegaly present.  Cardiovascular: Normal rate, regular rhythm, S1 normal, S2 normal, normal heart sounds, intact distal pulses and normal pulses.  Exam reveals no gallop and no friction rub.   No murmur heard. Pulmonary/Chest: Effort normal and breath sounds normal. No tachypnea. No respiratory distress. She has no decreased breath sounds. She has no wheezes. She has no rhonchi. She has no rales.  Abdominal: Soft. Normal appearance and bowel sounds are normal. There is no tenderness.  Neurological: She is alert.  Skin: Skin is warm, dry and intact. No rash noted.  Psychiatric: Her speech is normal and behavior is normal. Judgment and thought content normal. Her mood appears not anxious. Cognition and memory are normal. She does not exhibit a depressed mood.  Diabetic foot exam: Normal inspection No skin breakdown No calluses  Normal DP pulses Normal sensation to light touch and monofilament Nails normal         Assessment &  Plan:

## 2015-05-16 NOTE — Patient Instructions (Addendum)
Schedule yearly eye exam.  Stop smoking.  Continue chantix until 3-6 months after quitting.  Call if appetite and mood not improving.

## 2015-05-16 NOTE — Assessment & Plan Note (Signed)
Eval at next OV with spirometry once pt has quit smoking. She is using chantix to quit and was encouraged to try cessation again.

## 2015-05-16 NOTE — Assessment & Plan Note (Signed)
Now at goal on current regimen with metformin and glucotrol.  She reports this may be due to her decrease appetite. No weight loss. Encouraged exercise, weight loss, healthy eating habits.

## 2015-05-16 NOTE — Progress Notes (Signed)
Pre visit review using our clinic review tool, if applicable. No additional management support is needed unless otherwise documented below in the visit note. 

## 2015-05-16 NOTE — Assessment & Plan Note (Signed)
Moderate control, she states she does better in the fall. She is not interested in increasing venlafaxine at this time. Will follow up in 3 months.

## 2015-07-28 ENCOUNTER — Other Ambulatory Visit: Payer: Self-pay | Admitting: Family Medicine

## 2015-07-28 NOTE — Telephone Encounter (Signed)
Last office visit 05/16/2015.  Last refilled 04/13/2014 for #180 with 1 refill.  Ok to refill?

## 2015-08-03 ENCOUNTER — Other Ambulatory Visit: Payer: Self-pay | Admitting: Family Medicine

## 2015-08-07 ENCOUNTER — Other Ambulatory Visit: Payer: Self-pay | Admitting: Family Medicine

## 2015-08-09 ENCOUNTER — Other Ambulatory Visit: Payer: Self-pay | Admitting: Family Medicine

## 2015-08-18 ENCOUNTER — Encounter: Payer: Self-pay | Admitting: Family Medicine

## 2015-08-18 ENCOUNTER — Ambulatory Visit (INDEPENDENT_AMBULATORY_CARE_PROVIDER_SITE_OTHER): Payer: Medicare Other | Admitting: Family Medicine

## 2015-08-18 VITALS — BP 144/76 | HR 80 | Temp 97.4°F | Ht 62.0 in | Wt 140.5 lb

## 2015-08-18 DIAGNOSIS — Z23 Encounter for immunization: Secondary | ICD-10-CM

## 2015-08-18 DIAGNOSIS — J449 Chronic obstructive pulmonary disease, unspecified: Secondary | ICD-10-CM

## 2015-08-18 DIAGNOSIS — F331 Major depressive disorder, recurrent, moderate: Secondary | ICD-10-CM | POA: Diagnosis not present

## 2015-08-18 DIAGNOSIS — F17200 Nicotine dependence, unspecified, uncomplicated: Secondary | ICD-10-CM

## 2015-08-18 DIAGNOSIS — E0842 Diabetes mellitus due to underlying condition with diabetic polyneuropathy: Secondary | ICD-10-CM

## 2015-08-18 LAB — HM DIABETES FOOT EXAM

## 2015-08-18 MED ORDER — VENLAFAXINE HCL ER 75 MG PO CP24: 75.0000 mg | ORAL_CAPSULE | Freq: Every day | ORAL | Status: DC

## 2015-08-18 NOTE — Progress Notes (Signed)
Pre visit review using our clinic review tool, if applicable. No additional management support is needed unless otherwise documented below in the visit note. 

## 2015-08-18 NOTE — Assessment & Plan Note (Signed)
Inadequate control, increase venlafaxine to 75 mg daily and follow up in 1 month.

## 2015-08-18 NOTE — Assessment & Plan Note (Addendum)
Inadequate control. Encouraged smoking cessation.  Spirometry on Spiriva: shows no obstruction.  Continue spiriva quit smoking for better control.

## 2015-08-18 NOTE — Progress Notes (Signed)
69 year old female presents for 3 months follow up.  Diabetes: Good control on metformin max and glipizide mg Lab Results  Component Value Date   HGBA1C 6.6* 05/10/2015   Using medications without difficulties: Hypoglycemic episodes:None Hyperglycemic episodes:occ Feet problems: Has healed lesion on left foot,. See previous OV. Still discolored. Blood Sugars averaging: FBS 90, at bedtime 150 eye exam within last year: due  Major depressive disorder, moderate control  on venlafaxine low dose 37.5 mg daily.  Decreased appetite has improved.Angelina Ok COPD, no improvement. SOB with exertion .[name] Up stairs, none at rest. Occ wheezing.   Daily cough, mucus, clear. Last spirometry: 2009 FEV1/FVC 68% 05/2014 nml after starting spiriva 72 % FEV/FVC SE of bad taste in mouth with Advair. Spiriva does not seem to be helping with COPD much.  She is using chantix ( has been on now in last 2 months) to quit smoking.  She stopped  Smoking for 3 weeks but has started back now. 1/2 pack a day.  Restarted because of stress.             Review of Systems  Constitutional: Negative for fever and fatigue.  HENT: Negative for ear pain.  Eyes: Negative for pain.  Respiratory: Positive for shortness of breath with exertion.  Cardiovascular: Negative for chest pain.       Objective:   Physical Exam  Constitutional: Vital signs are normal. She appears well-developed and well-nourished. She is cooperative. Non-toxic appearance. She does not appear ill. No distress.  HENT:  Head: Normocephalic.  Right Ear: Hearing, tympanic membrane, external ear and ear canal normal. Tympanic membrane is not erythematous, not retracted and not bulging.  Left Ear: Hearing, tympanic membrane, external ear and ear canal normal. Tympanic membrane is not erythematous, not retracted and not bulging.  Nose: No mucosal edema or rhinorrhea. Right sinus exhibits no maxillary sinus tenderness and no  frontal sinus tenderness. Left sinus exhibits no maxillary sinus tenderness and no frontal sinus tenderness.  Mouth/Throat: Uvula is midline, oropharynx is clear and moist and mucous membranes are normal.  Eyes: Conjunctivae, EOM and lids are normal. Pupils are equal, round, and reactive to light. Lids are everted and swept, no foreign bodies found.  Neck: Trachea normal and normal range of motion. Neck supple. Carotid bruit is not present. No thyroid mass and no thyromegaly present.  Cardiovascular: Normal rate, regular rhythm, S1 normal, S2 normal, normal heart sounds, intact distal pulses and normal pulses. Exam reveals no gallop and no friction rub.  No murmur heard. Pulmonary/Chest: Effort normal and breath sounds normal. No tachypnea. No respiratory distress. She has no decreased breath sounds. She has no wheezes. She has no rhonchi. She has no rales.  Abdominal: Soft. Normal appearance and bowel sounds are normal. There is no tenderness.  Neurological: She is alert.  Skin: Skin is warm, dry and intact. No rash noted.  Psychiatric: Her speech is normal and behavior is normal. Judgment and thought content normal. Her mood appears not anxious. Cognition and memory are normal. She does not exhibit a depressed mood.   Diabetic foot exam: Normal inspection No skin breakdown No calluses  Normal DP pulses Normal sensation to light touch and monofilament Nails normal

## 2015-08-18 NOTE — Patient Instructions (Addendum)
Quit smoking, continue Chantix. Increase venlafaxine to 75 mg daily.  Continue spiriva.

## 2015-08-18 NOTE — Assessment & Plan Note (Signed)
Well controlled. Continue current medication. Encouraged exercise, weight loss, healthy eating habits.  

## 2015-09-15 ENCOUNTER — Ambulatory Visit: Payer: Medicare Other | Admitting: Family Medicine

## 2015-09-19 ENCOUNTER — Other Ambulatory Visit: Payer: Self-pay | Admitting: Family Medicine

## 2015-09-19 DIAGNOSIS — Z1231 Encounter for screening mammogram for malignant neoplasm of breast: Secondary | ICD-10-CM

## 2015-11-23 ENCOUNTER — Other Ambulatory Visit: Payer: Self-pay | Admitting: Family Medicine

## 2015-11-23 NOTE — Telephone Encounter (Signed)
Last office visit 08/18/2015.  Last refilled 08/07/2015 for #56 with 2 refills.  Ok to refill?

## 2016-01-10 ENCOUNTER — Ambulatory Visit: Payer: Medicare Other | Attending: Family Medicine

## 2016-01-22 ENCOUNTER — Other Ambulatory Visit: Payer: Self-pay | Admitting: Family Medicine

## 2016-01-22 NOTE — Telephone Encounter (Signed)
Last office visit 08/18/2015.  Last refilled 04/13/2014 for #180 with 1 refill.  Refill?

## 2016-01-30 ENCOUNTER — Encounter: Payer: Self-pay | Admitting: Family Medicine

## 2016-01-30 ENCOUNTER — Ambulatory Visit (INDEPENDENT_AMBULATORY_CARE_PROVIDER_SITE_OTHER): Payer: Medicare Other | Admitting: Family Medicine

## 2016-01-30 VITALS — BP 116/74 | HR 87 | Temp 98.3°F | Ht 62.0 in | Wt 140.5 lb

## 2016-01-30 DIAGNOSIS — R197 Diarrhea, unspecified: Secondary | ICD-10-CM

## 2016-01-30 DIAGNOSIS — F17200 Nicotine dependence, unspecified, uncomplicated: Secondary | ICD-10-CM

## 2016-01-30 NOTE — Patient Instructions (Addendum)
Quit smoking again. Work on stress reduction, relaxation. Get back to walking. Drink lots of fluids. Call if symptoms return.

## 2016-01-30 NOTE — Progress Notes (Signed)
   Subjective:    Patient ID: Cindy Burns, female    DOB: March 08, 1946, 70 y.o.   MRN: [id number]  HPI   70 year old female with history of diabetes presents with acute intermittent diarrhea in last 3 weeks. BMs 2-3 times daily, loose to nml No abdominal pain.  No N/V. No blood in stool. No change in diet.  She has taken loperamide x 2 .. This has helped significantly. Pepto bismol and cheese did not  Help at all. Now symptoms are resolved. She is now back to her normal baseline.  No recent travel, no recent antibiotics.  She has quit smoking.. She has been more on edge lately, wonders if stress causing diarrhea.   Social History /Family History/Past Medical History reviewed and updated if needed.   Review of Systems  Constitutional: Negative for fever and fatigue.  HENT: Negative for ear pain.   Eyes: Negative for pain.  Respiratory: Negative for chest tightness and shortness of breath.   Cardiovascular: Negative for chest pain, palpitations and leg swelling.  Gastrointestinal: Negative for abdominal pain.  Genitourinary: Negative for dysuria.       Objective:   Physical Exam  Constitutional: Vital signs are normal. She appears well-developed and well-nourished. She is cooperative.  Non-toxic appearance. She does not appear ill. No distress.  HENT:  Head: Normocephalic.  Right Ear: Hearing, tympanic membrane, external ear and ear canal normal. Tympanic membrane is not erythematous, not retracted and not bulging.  Left Ear: Hearing, tympanic membrane, external ear and ear canal normal. Tympanic membrane is not erythematous, not retracted and not bulging.  Nose: No mucosal edema or rhinorrhea. Right sinus exhibits no maxillary sinus tenderness and no frontal sinus tenderness. Left sinus exhibits no maxillary sinus tenderness and no frontal sinus tenderness.  Mouth/Throat: Uvula is midline, oropharynx is clear and moist and mucous membranes are normal.  Eyes: Conjunctivae,  EOM and lids are normal. Pupils are equal, round, and reactive to light. Lids are everted and swept, no foreign bodies found.  Neck: Trachea normal and normal range of motion. Neck supple. Carotid bruit is not present. No thyroid mass and no thyromegaly present.  Cardiovascular: Normal rate, regular rhythm, S1 normal, S2 normal, normal heart sounds, intact distal pulses and normal pulses.  Exam reveals no gallop and no friction rub.   No murmur heard. Pulmonary/Chest: Effort normal and breath sounds normal. No tachypnea. No respiratory distress. She has no decreased breath sounds. She has no wheezes. She has no rhonchi. She has no rales.  Abdominal: Soft. Normal appearance and bowel sounds are normal. There is no tenderness.  Neurological: She is alert.  Skin: Skin is warm, dry and intact. No rash noted.  Psychiatric: Her speech is normal and behavior is normal. Judgment and thought content normal. Her mood appears not anxious. Cognition and memory are normal. She does not exhibit a depressed mood.          Assessment & Plan:

## 2016-01-30 NOTE — Assessment & Plan Note (Signed)
Encouraged pt to quit smoking again. Reassured that this is not cause of diarrhea

## 2016-01-30 NOTE — Progress Notes (Signed)
Pre visit review using our clinic review tool, if applicable. No additional management support is needed unless otherwise documented below in the visit note. 

## 2016-01-30 NOTE — Assessment & Plan Note (Signed)
Viral illness versus IBS.  Now resolved with immodium.

## 2016-02-15 ENCOUNTER — Other Ambulatory Visit: Payer: Self-pay

## 2016-02-15 MED ORDER — METFORMIN HCL 500 MG PO TABS: 1000.0000 mg | ORAL_TABLET | Freq: Two times a day (BID) | ORAL | Status: DC

## 2016-02-15 MED ORDER — GLIPIZIDE ER 10 MG PO TB24: 10.0000 mg | ORAL_TABLET | Freq: Every morning | ORAL | Status: DC

## 2016-02-15 MED ORDER — ROSUVASTATIN CALCIUM 40 MG PO TABS: 40.0000 mg | ORAL_TABLET | Freq: Every day | ORAL | Status: DC

## 2016-02-15 MED ORDER — VENLAFAXINE HCL ER 75 MG PO CP24: 75.0000 mg | ORAL_CAPSULE | Freq: Every day | ORAL | Status: DC

## 2016-02-15 MED ORDER — GABAPENTIN 400 MG PO CAPS: 800.0000 mg | ORAL_CAPSULE | Freq: Every day | ORAL | Status: DC

## 2016-02-15 MED ORDER — GLUCOSE BLOOD VI STRP: ORAL_STRIP | Status: DC

## 2016-02-15 NOTE — Telephone Encounter (Signed)
Rxs sent electronically.  

## 2016-02-22 ENCOUNTER — Telehealth: Payer: Self-pay | Admitting: Family Medicine

## 2016-02-22 DIAGNOSIS — E782 Mixed hyperlipidemia: Secondary | ICD-10-CM

## 2016-02-22 DIAGNOSIS — E0842 Diabetes mellitus due to underlying condition with diabetic polyneuropathy: Secondary | ICD-10-CM

## 2016-02-22 NOTE — Telephone Encounter (Signed)
-----   Message from Ellamae Sia sent at 02/14/2016  4:33 PM EDT ----- Regarding: Lab orders for Friday, 5.19.17 Lab orders for a 1 month f/u

## 2016-02-23 ENCOUNTER — Other Ambulatory Visit (INDEPENDENT_AMBULATORY_CARE_PROVIDER_SITE_OTHER): Payer: Medicare Other

## 2016-02-23 DIAGNOSIS — E0842 Diabetes mellitus due to underlying condition with diabetic polyneuropathy: Secondary | ICD-10-CM

## 2016-02-23 DIAGNOSIS — E782 Mixed hyperlipidemia: Secondary | ICD-10-CM

## 2016-02-23 LAB — COMPREHENSIVE METABOLIC PANEL
ALT: 21 U/L (ref 0–35)
AST: 22 U/L (ref 0–37)
Albumin: 4.5 g/dL (ref 3.5–5.2)
Alkaline Phosphatase: 56 U/L (ref 39–117)
BUN: 10 mg/dL (ref 6–23)
CO2: 25 mEq/L (ref 19–32)
Calcium: 9.6 mg/dL (ref 8.4–10.5)
Chloride: 100 mEq/L (ref 96–112)
Creatinine, Ser: 0.69 mg/dL (ref 0.40–1.20)
GFR: 90.05 mL/min (ref >60.00)
Glucose, Bld: 202 mg/dL — ABNORMAL HIGH (ref 70–99)
Potassium: 4 mEq/L (ref 3.5–5.1)
Sodium: 134 mEq/L — ABNORMAL LOW (ref 135–145)
Total Bilirubin: 0.5 mg/dL (ref 0.2–1.2)
Total Protein: 7.1 g/dL (ref 6.0–8.3)

## 2016-02-23 LAB — LIPID PANEL
Cholesterol: 120 mg/dL (ref 0–200)
HDL: 36.2 mg/dL — ABNORMAL LOW (ref >39.00)
NonHDL: 83.84
Total CHOL/HDL Ratio: 3
Triglycerides: 332 mg/dL — ABNORMAL HIGH (ref 0.0–149.0)
VLDL: 66.4 mg/dL — ABNORMAL HIGH (ref 0.0–40.0)

## 2016-02-23 LAB — LDL CHOLESTEROL, DIRECT: Direct LDL: 42 mg/dL

## 2016-02-23 LAB — HEMOGLOBIN A1C: Hgb A1c MFr Bld: 8.3 % — ABNORMAL HIGH (ref 4.6–6.5)

## 2016-03-01 ENCOUNTER — Ambulatory Visit (INDEPENDENT_AMBULATORY_CARE_PROVIDER_SITE_OTHER): Payer: Medicare Other | Admitting: Family Medicine

## 2016-03-01 ENCOUNTER — Encounter: Payer: Self-pay | Admitting: Family Medicine

## 2016-03-01 VITALS — BP 134/70 | HR 88 | Temp 98.0°F | Wt 141.0 lb

## 2016-03-01 DIAGNOSIS — F17200 Nicotine dependence, unspecified, uncomplicated: Secondary | ICD-10-CM | POA: Diagnosis not present

## 2016-03-01 DIAGNOSIS — E782 Mixed hyperlipidemia: Secondary | ICD-10-CM

## 2016-03-01 DIAGNOSIS — E0842 Diabetes mellitus due to underlying condition with diabetic polyneuropathy: Secondary | ICD-10-CM

## 2016-03-01 DIAGNOSIS — E1143 Type 2 diabetes mellitus with diabetic autonomic (poly)neuropathy: Secondary | ICD-10-CM

## 2016-03-01 DIAGNOSIS — J449 Chronic obstructive pulmonary disease, unspecified: Secondary | ICD-10-CM

## 2016-03-01 LAB — HM DIABETES FOOT EXAM

## 2016-03-01 MED ORDER — VARENICLINE TARTRATE 1 MG PO TABS: 1.0000 mg | ORAL_TABLET | Freq: Two times a day (BID) | ORAL | Status: DC

## 2016-03-01 NOTE — Patient Instructions (Addendum)
Work on low [employer], stop sugar in coffee. Increase water, stop juice/lemonade.  Start back on exercise. Get yearly eye exam.  Quit smoking. Restart chantix for 6 months.

## 2016-03-01 NOTE — Progress Notes (Signed)
Pre visit review using our clinic review tool, if applicable. No additional management support is needed unless otherwise documented below in the visit note. 

## 2016-03-01 NOTE — Progress Notes (Signed)
70 year old female presents for follow up.  Diabetes: Inadequate control on metformin max and glipizide mg No current infection She has been putting sugar in coffee, poor diet, worse than usual. She has been fairly compliant with her meds. No walking as she used to. Lab Results  Component Value Date   HGBA1C 8.3* 02/23/2016  Using medications without difficulties: Hypoglycemic episodes:None Hyperglycemic episodes:occ Feet problems: none. Blood Sugars averaging: She has been having issues with meter, FBS 276 eye exam within last year: due  Elevated Cholesterol:  LDL at goal on crestor 40 mg daily. Lab Results  Component Value Date   CHOL 120 02/23/2016   HDL 36.20* 02/23/2016   LDLCALC 26 12/17/2013   LDLDIRECT 42.0 02/23/2016   TRIG 332.0* 02/23/2016   CHOLHDL 3 02/23/2016  Using medications without problems: Muscle aches:  None   BP Readings from Last 3 Encounters:  03/01/16 134/70  01/30/16 116/74  08/18/15 144/76   Restarted smoking once stopped chantix.. Now back on 1 pack a day.   Social History /Family History/Past Medical History reviewed and updated if needed.       Review of Systems  Constitutional: Negative for fever and fatigue.  HENT: Negative for ear pain.  Eyes: Negative for pain.  Respiratory: Negative for shortness of breath. Cough whren she lies down at night.  Cardiovascular: Negative for chest pain.       Objective:   Physical Exam  Constitutional: Vital signs are normal. She appears well-developed and well-nourished. She is cooperative. Non-toxic appearance. She does not appear ill. No distress.  HENT:  Head: Normocephalic.  Right Ear: Hearing, tympanic membrane, external ear and ear canal normal. Tympanic membrane is not erythematous, not retracted and not bulging.  Left Ear: Hearing, tympanic membrane, external ear and ear canal normal. Tympanic membrane is not erythematous, not retracted and not bulging.  Nose: No mucosal  edema or rhinorrhea. Right sinus exhibits no maxillary sinus tenderness and no frontal sinus tenderness. Left sinus exhibits no maxillary sinus tenderness and no frontal sinus tenderness.  Mouth/Throat: Uvula is midline, oropharynx is clear and moist and mucous membranes are normal.  Eyes: Conjunctivae, EOM and lids are normal. Pupils are equal, round, and reactive to light. Lids are everted and swept, no foreign bodies found.  Neck: Trachea normal and normal range of motion. Neck supple. Carotid bruit is not present. No thyroid mass and no thyromegaly present.  Cardiovascular: Normal rate, regular rhythm, S1 normal, S2 normal, normal heart sounds, intact distal pulses and normal pulses. Exam reveals no gallop and no friction rub.  No murmur heard. Pulmonary/Chest: Effort normal and breath sounds normal. No tachypnea. No respiratory distress. She has no decreased breath sounds. She has no wheezes. She has no rhonchi. She has no rales.  Abdominal: Soft. Normal appearance and bowel sounds are normal. There is no tenderness.  Neurological: She is alert.  Skin: Skin is warm, dry and intact. No rash noted.  Psychiatric: Her speech is normal and behavior is normal. Judgment and thought content normal. Her mood appears not anxious. Cognition and memory are normal. She does not exhibit a depressed mood.   Diabetic foot exam: Normal inspection No skin breakdown No calluses  Normal DP pulses Normal sensation to light touch and monofilament Nails normal

## 2016-03-11 ENCOUNTER — Telehealth: Payer: Self-pay

## 2016-03-11 MED ORDER — VARENICLINE TARTRATE 0.5 MG X 11 & 1 MG X 42 PO MISC: ORAL | Status: DC

## 2016-03-11 NOTE — Telephone Encounter (Signed)
Sent in starter pack.

## 2016-03-11 NOTE — Telephone Encounter (Signed)
Pt was seen 03/01/16 and had been smoking for 3 weeks; pt request starter pak of [facility]ix [facility]. Pt request cb when done.

## 2016-03-12 NOTE — Telephone Encounter (Signed)
Left message for [name] that Dr. Diona Browner has sent in prescription for Chantix Starter Pack to [facility] as requested.

## 2016-03-21 NOTE — Assessment & Plan Note (Signed)
Get back on track with healthy lifestyle. Re-eval in 3 months.

## 2016-03-21 NOTE — Assessment & Plan Note (Signed)
Due to poor D<M control Stable on current medications.

## 2016-03-21 NOTE — Assessment & Plan Note (Signed)
Repeat chantix course

## 2016-03-21 NOTE — Assessment & Plan Note (Signed)
Encouraged pt to quit smoking to continue to improve breathing.

## 2016-03-21 NOTE — Assessment & Plan Note (Signed)
Well controlled. Continue current medication.  

## 2016-04-04 ENCOUNTER — Other Ambulatory Visit: Payer: Self-pay | Admitting: Family Medicine

## 2016-04-04 NOTE — Telephone Encounter (Signed)
Last office visit 03/01/2016.  Last refilled 02/15/2016 for #180 with no refills. Ok to refill?

## 2016-05-20 ENCOUNTER — Encounter: Payer: Self-pay | Admitting: Family Medicine

## 2016-05-31 ENCOUNTER — Other Ambulatory Visit: Payer: Self-pay | Admitting: Family Medicine

## 2016-05-31 ENCOUNTER — Other Ambulatory Visit: Payer: Medicare Other

## 2016-06-04 ENCOUNTER — Ambulatory Visit: Payer: Medicare Other | Admitting: Family Medicine

## 2016-06-25 ENCOUNTER — Other Ambulatory Visit: Payer: Self-pay | Admitting: Family Medicine

## 2016-06-26 ENCOUNTER — Telehealth: Payer: Self-pay | Admitting: Family Medicine

## 2016-06-26 ENCOUNTER — Other Ambulatory Visit: Payer: Self-pay | Admitting: Family Medicine

## 2016-06-26 MED ORDER — GLUCOSE BLOOD VI STRP: ORAL_STRIP | 3 refills | Status: DC

## 2016-06-26 MED ORDER — ACCU-CHEK NANO SMARTVIEW W/DEVICE KIT: PACK | 0 refills | Status: DC

## 2016-06-26 NOTE — Telephone Encounter (Signed)
Bea at [facility] called about some refills requests that they recently sent to [place].  Wanted to confirm we received them.  ALso wanted to discuss changing her One Touch to a an Patent examiner.  Please call her at [phone] to discuss.

## 2016-06-26 NOTE — Telephone Encounter (Signed)
Patient returned Donna's call.  Patient can be reached at [phone].

## 2016-06-26 NOTE — Telephone Encounter (Signed)
I spoke with patient.  She states she does not need refills on the glipizide and crestor that [place] keeps sending [place].  Refilled for the year with [facility] 03/2016.  Rx sent to [facility] for Accu-Chek Nano Meter and test strips.

## 2016-06-26 NOTE — Telephone Encounter (Signed)
Spoke with Cindy Burns.  She states she does not need refills on the Glipizide or Crestor but she does need a Rx for an Accu-Chek Nano Meter and Test Strips.  Prescriptions sent to [facility] for meter and strips as requested.

## 2016-06-26 NOTE — Telephone Encounter (Signed)
Left message for Cindy Burns to return my call.  Need to verify if she is using [place] or [facility]. Getting refill request from [facility] for her Glipizide and Crestor but these were refilled with [facility] on 04/04/2016 for #90 with 3 refill.

## 2016-06-27 ENCOUNTER — Ambulatory Visit (INDEPENDENT_AMBULATORY_CARE_PROVIDER_SITE_OTHER): Payer: Medicare Other | Admitting: Family Medicine

## 2016-06-27 ENCOUNTER — Encounter: Payer: Self-pay | Admitting: Family Medicine

## 2016-06-27 VITALS — BP 144/86 | HR 89 | Temp 98.8°F | Ht 62.0 in | Wt 137.5 lb

## 2016-06-27 DIAGNOSIS — E0842 Diabetes mellitus due to underlying condition with diabetic polyneuropathy: Secondary | ICD-10-CM

## 2016-06-27 DIAGNOSIS — Z23 Encounter for immunization: Secondary | ICD-10-CM

## 2016-06-27 DIAGNOSIS — E782 Mixed hyperlipidemia: Secondary | ICD-10-CM

## 2016-06-27 DIAGNOSIS — R220 Localized swelling, mass and lump, head: Secondary | ICD-10-CM

## 2016-06-27 NOTE — Patient Instructions (Addendum)
Stop at lab on way out for A1C. Call in 2 weeks with blood sugar measurements fasting and  2 hours after meals.  We will paln on starting victoza if sugars are not coming down with the study medication.  Quit smoking. We call you about the plan for swelling in left face.

## 2016-06-27 NOTE — Progress Notes (Signed)
Pre visit review using our clinic review tool, if applicable. No additional management support is needed unless otherwise documented below in the visit note. 

## 2016-06-27 NOTE — Progress Notes (Signed)
70 year old female presents for  3 month follow up DM.  Diabetes: Due for re-eval. On Max glipizide and metformin. Started clinical trial for combo med for DM and cholesterol 2 weeks ago. Using medications without difficulties: Hypoglycemic episodes:None Hyperglycemic episodes:occ Feet problems: none. Blood Sugars averaging: FBS 200s, Does not check after meals. eye exam within last year: due  Elevated Cholesterol:  LDL at goal on crestor 40 mg daily at last check Recent Labs       Lab Results  Component Value Date   CHOL 120 02/23/2016   HDL 36.20* 02/23/2016   LDLCALC 26 12/17/2013   LDLDIRECT 42.0 02/23/2016   TRIG 332.0* 02/23/2016   CHOLHDL 3 02/23/2016    Using medications without problems: Muscle aches:  None   BP Readings from Last 3 Encounters:  06/27/16 (!) 144/86  03/01/16 134/70  01/30/16 116/74   On chantix for smoking cessation. Social History /Family History/Past Medical History reviewed and updated if needed.     Mass in left face , upper neck. Comes and goes. Smoker Typically goes away on its own after a few  hours.  Resolves completely.  Has noted since breast cancer in 2005. Tender when larger.  Never mentioned to MD in past.     Review of Systems  Constitutional: Negative for fever and fatigue.  HENT: Negative for ear pain.  Eyes: Negative for pain.  Respiratory: Negative for shortness of breath. Cough whren she lies down at night.  Cardiovascular: Negative for chest pain.       Objective:   Physical Exam  Constitutional: Vital signs are normal. She appears well-developed and well-nourished. She is cooperative. Non-toxic appearance. She does not appear ill. No distress.  HENT:  Head: Normocephalic.  Right Ear: Hearing, tympanic membrane, external ear and ear canal normal. Tympanic membrane is not erythematous, not retracted and not bulging.  Left Ear: Hearing, tympanic membrane, external ear and ear canal normal.  Tympanic membrane is not erythematous, not retracted and not bulging.  Nose: No mucosal edema or rhinorrhea. Right sinus exhibits no maxillary sinus tenderness and no frontal sinus tenderness. Left sinus exhibits no maxillary sinus tenderness and no frontal sinus tenderness.  Mouth/Throat: Uvula is midline, oropharynx is clear and moist and mucous membranes are normal.  Eyes: Conjunctivae, EOM and lids are normal. Pupils are equal, round, and reactive to light. Lids are everted and swept, no foreign bodies found.  Neck: Trachea normal and normal range of motion. Neck supple. Carotid bruit is not present. No thyroid mass and no thyromegaly present.  Cardiovascular: Normal rate, regular rhythm, S1 normal, S2 normal, normal heart sounds, intact distal pulses and normal pulses. Exam reveals no gallop and no friction rub.  No murmur heard. Pulmonary/Chest: Effort normal and breath sounds normal. No tachypnea. No respiratory distress. She has no decreased breath sounds. She has no wheezes. She has no rhonchi. She has no rales.  Abdominal: Soft. Normal appearance and bowel sounds are normal. There is no tenderness.  Neurological: She is alert.  Skin: Skin is warm, dry and intact. No rash noted.  Psychiatric: Her speech is normal and behavior is normal. Judgment and thought content normal. Her mood appears not anxious. Cognition and memory are normal. She does not exhibit a depressed mood.   Diabetic foot exam: Normal inspection No skin breakdown No calluses  Normal DP pulses Normal sensation to light touch and monofilament Nails normal

## 2016-06-28 LAB — HEMOGLOBIN A1C: Hgb A1c MFr Bld: 7.8 % — ABNORMAL HIGH (ref 4.6–6.5)

## 2016-07-12 ENCOUNTER — Other Ambulatory Visit: Payer: Self-pay | Admitting: Family Medicine

## 2016-07-26 DIAGNOSIS — R220 Localized swelling, mass and lump, head: Secondary | ICD-10-CM | POA: Insufficient documentation

## 2016-07-26 NOTE — Assessment & Plan Note (Signed)
Due for re-eval. Call in 2 weeks with blood sugar measurements fasting and  2 hours after meals.  We will paln on starting victoza if sugars are not coming down with the study medication.

## 2016-07-26 NOTE — Assessment & Plan Note (Signed)
Well controlled. Continue current medication. Encouraged exercise, weight loss, healthy eating habits.  

## 2016-11-29 ENCOUNTER — Telehealth: Payer: Self-pay | Admitting: Family Medicine

## 2016-11-29 DIAGNOSIS — Z1159 Encounter for screening for other viral diseases: Secondary | ICD-10-CM

## 2016-11-29 DIAGNOSIS — E782 Mixed hyperlipidemia: Secondary | ICD-10-CM

## 2016-11-29 DIAGNOSIS — Z9189 Other specified personal risk factors, not elsewhere classified: Secondary | ICD-10-CM

## 2016-11-29 DIAGNOSIS — E0842 Diabetes mellitus due to underlying condition with diabetic polyneuropathy: Secondary | ICD-10-CM

## 2016-11-29 DIAGNOSIS — D649 Anemia, unspecified: Secondary | ICD-10-CM

## 2016-11-29 NOTE — Telephone Encounter (Signed)
-----   Message from Eustace Pen, LPN sent at 6/91/6756  4:14 PM EST ----- Regarding: Labs 11/29/16 Please place lab orders. Thank you.

## 2016-11-29 NOTE — Telephone Encounter (Signed)
-----   Message from Eustace Pen, LPN sent at 1/67/4255  4:14 PM EST ----- Regarding: Labs 11/29/16 Please place lab orders. Thank you.

## 2016-12-02 ENCOUNTER — Ambulatory Visit: Payer: Medicare Other

## 2016-12-02 ENCOUNTER — Other Ambulatory Visit: Payer: Medicare Other

## 2016-12-18 ENCOUNTER — Telehealth: Payer: Self-pay | Admitting: Family Medicine

## 2016-12-18 NOTE — Telephone Encounter (Signed)
Left pt message asking to call Allison back directly at [phone] to schedule AWV.+ labs with Lesia and CPE with PCP. °

## 2016-12-24 LAB — HM DIABETES EYE EXAM

## 2016-12-27 ENCOUNTER — Encounter: Payer: Self-pay | Admitting: Family Medicine

## 2016-12-27 ENCOUNTER — Ambulatory Visit (INDEPENDENT_AMBULATORY_CARE_PROVIDER_SITE_OTHER): Payer: Medicare Other | Admitting: Family Medicine

## 2016-12-27 ENCOUNTER — Encounter (INDEPENDENT_AMBULATORY_CARE_PROVIDER_SITE_OTHER): Payer: Self-pay

## 2016-12-27 DIAGNOSIS — E782 Mixed hyperlipidemia: Secondary | ICD-10-CM

## 2016-12-27 DIAGNOSIS — E1143 Type 2 diabetes mellitus with diabetic autonomic (poly)neuropathy: Secondary | ICD-10-CM

## 2016-12-27 DIAGNOSIS — E0842 Diabetes mellitus due to underlying condition with diabetic polyneuropathy: Secondary | ICD-10-CM | POA: Diagnosis not present

## 2016-12-27 NOTE — Progress Notes (Signed)
Pre visit review using our clinic review tool, if applicable. No additional management support is needed unless otherwise documented below in the visit note. 

## 2016-12-27 NOTE — Assessment & Plan Note (Signed)
Due to poor control DM. Stable neuropathy.

## 2016-12-27 NOTE — Assessment & Plan Note (Signed)
IMproving control. Re-eval with labs.

## 2016-12-27 NOTE — Assessment & Plan Note (Signed)
Due for re-eval. 

## 2016-12-27 NOTE — Patient Instructions (Addendum)
Return for labs Monday morning. Work on [employer] and regular exercise. Quit smoking.

## 2016-12-27 NOTE — Progress Notes (Signed)
   Subjective:    Patient ID: Cindy Burns, female    DOB: June 11, 1946, 71 y.o.   MRN: [id number]  HPI  71 year old female presents for DM follow up.  Diabetes:  Due for re-eval. On new study medication. Pt off metformin. Off glipizide max. Using medications without difficulties: Hypoglycemic episodes: none Hyperglycemic episodes: occ Feet problems: none Blood Sugars averaging: FBS 170 eye exam within last year: last week    Due for re-eval cholesterol. Pt no longer on crestor  BP Readings from Last 3 Encounters:  12/27/16 140/76  06/27/16 (!) 144/86  03/01/16 134/70       Review of Systems  Constitutional: Negative for fatigue and fever.  HENT: Negative for ear pain.   Eyes: Negative for pain.  Respiratory: Negative for chest tightness and shortness of breath.   Cardiovascular: Negative for chest pain, palpitations and leg swelling.  Gastrointestinal: Negative for abdominal pain.  Genitourinary: Negative for dysuria.       Objective:   Physical Exam  Constitutional: Vital signs are normal. She appears well-developed and well-nourished. She is cooperative.  Non-toxic appearance. She does not appear ill. No distress.  HENT:  Head: Normocephalic.  Right Ear: Hearing, tympanic membrane, external ear and ear canal normal. Tympanic membrane is not erythematous, not retracted and not bulging.  Left Ear: Hearing, tympanic membrane, external ear and ear canal normal. Tympanic membrane is not erythematous, not retracted and not bulging.  Nose: No mucosal edema or rhinorrhea. Right sinus exhibits no maxillary sinus tenderness and no frontal sinus tenderness. Left sinus exhibits no maxillary sinus tenderness and no frontal sinus tenderness.  Mouth/Throat: Uvula is midline, oropharynx is clear and moist and mucous membranes are normal.  Eyes: Conjunctivae, EOM and lids are normal. Pupils are equal, round, and reactive to light. Lids are everted and swept, no foreign bodies found.    Neck: Trachea normal and normal range of motion. Neck supple. Carotid bruit is not present. No thyroid mass and no thyromegaly present.  Cardiovascular: Normal rate, regular rhythm, S1 normal, S2 normal, normal heart sounds, intact distal pulses and normal pulses.  Exam reveals no gallop and no friction rub.   No murmur heard. Pulmonary/Chest: Effort normal and breath sounds normal. No tachypnea. No respiratory distress. She has no decreased breath sounds. She has no wheezes. She has no rhonchi. She has no rales.  Abdominal: Soft. Normal appearance and bowel sounds are normal. There is no tenderness.  Neurological: She is alert.  Skin: Skin is warm, dry and intact. No rash noted.  Psychiatric: Her speech is normal and behavior is normal. Judgment and thought content normal. Her mood appears not anxious. Cognition and memory are normal. She does not exhibit a depressed mood.      Diabetic foot exam: Normal inspection, healing scab on left lateral foot No skin breakdown No calluses  Normal DP pulses Normal sensation to light touch and monofilament Nails thickened.     Assessment & Plan:

## 2016-12-30 ENCOUNTER — Other Ambulatory Visit: Payer: Medicare Other

## 2017-01-29 NOTE — Telephone Encounter (Signed)
Scheduled 03/27/17

## 2017-02-03 ENCOUNTER — Other Ambulatory Visit: Payer: Self-pay | Admitting: Family Medicine

## 2017-02-06 ENCOUNTER — Telehealth: Payer: Self-pay | Admitting: Family Medicine

## 2017-02-06 DIAGNOSIS — E871 Hypo-osmolality and hyponatremia: Secondary | ICD-10-CM

## 2017-02-06 NOTE — Telephone Encounter (Signed)
Notify pt that recent study labs show low sodium 128. She has had slightly low sodium in past.  Have her not restrict sodium and make sure she is not having more than 8x 8 oz glasses of water a day.  Return to recheck sodium in 1-2 weeks if not being rechecked by study MD.

## 2017-02-07 ENCOUNTER — Telehealth: Payer: Self-pay

## 2017-02-07 NOTE — Addendum Note (Signed)
Addended by: [name] on: 02/07/2017 10:08 AM   Modules accepted: Orders

## 2017-02-07 NOTE — Telephone Encounter (Signed)
[name] notified that Ms. Cindy Burns has been notified of the low sodium level  She was advised to not restrict sodium in her diet and make sure she is not having more than 8 x 8 oz glasses of water a day.  She is scheduled on 02/21/2017 for lab only to recheck sodium level.

## 2017-02-07 NOTE — Telephone Encounter (Signed)
Ms. Vanschaick notified as instructed by telephone.  Lab appointment scheduled for 02/21/2017 at 11:00 am to recheck sodium.

## 2017-02-07 NOTE — Telephone Encounter (Signed)
[name] with [facility] left v/m requesting cb; abigail wants to know if Dr Diona Browner received critical lab results on 01/31/17. Vernie Shanks is unable to reach pt and wants to verify pt has f/u about these values.

## 2017-02-21 ENCOUNTER — Other Ambulatory Visit (INDEPENDENT_AMBULATORY_CARE_PROVIDER_SITE_OTHER): Payer: Medicare Other

## 2017-02-21 DIAGNOSIS — E871 Hypo-osmolality and hyponatremia: Secondary | ICD-10-CM

## 2017-02-21 LAB — BASIC METABOLIC PANEL WITH GFR
BUN: 9 mg/dL (ref 6–23)
CO2: 25 meq/L (ref 19–32)
Calcium: 9.1 mg/dL (ref 8.4–10.5)
Chloride: 100 meq/L (ref 96–112)
Creatinine, Ser: 0.73 mg/dL (ref 0.40–1.20)
GFR: 84.13 mL/min (ref >60.00)
Glucose, Bld: 242 mg/dL — ABNORMAL HIGH (ref 70–99)
Potassium: 3.8 meq/L (ref 3.5–5.1)
Sodium: 131 meq/L — ABNORMAL LOW (ref 135–145)

## 2017-02-28 ENCOUNTER — Ambulatory Visit (INDEPENDENT_AMBULATORY_CARE_PROVIDER_SITE_OTHER): Payer: Medicare Other | Admitting: Family Medicine

## 2017-02-28 ENCOUNTER — Encounter: Payer: Self-pay | Admitting: Family Medicine

## 2017-02-28 VITALS — BP 173/89 | HR 84 | Temp 98.3°F | Ht 62.0 in | Wt 135.5 lb

## 2017-02-28 DIAGNOSIS — E782 Mixed hyperlipidemia: Secondary | ICD-10-CM | POA: Diagnosis not present

## 2017-02-28 DIAGNOSIS — I152 Hypertension secondary to endocrine disorders: Secondary | ICD-10-CM | POA: Insufficient documentation

## 2017-02-28 DIAGNOSIS — I10 Essential (primary) hypertension: Secondary | ICD-10-CM

## 2017-02-28 DIAGNOSIS — E0842 Diabetes mellitus due to underlying condition with diabetic polyneuropathy: Secondary | ICD-10-CM | POA: Diagnosis not present

## 2017-02-28 DIAGNOSIS — E1159 Type 2 diabetes mellitus with other circulatory complications: Secondary | ICD-10-CM | POA: Insufficient documentation

## 2017-02-28 MED ORDER — SITAGLIPTIN PHOSPHATE 100 MG PO TABS: 100.0000 mg | ORAL_TABLET | Freq: Every day | ORAL | 11 refills | Status: DC

## 2017-02-28 MED ORDER — VARENICLINE TARTRATE 0.5 MG X 11 & 1 MG X 42 PO MISC: ORAL | 0 refills | Status: DC

## 2017-02-28 MED ORDER — VARENICLINE TARTRATE 1 MG PO TABS: 1.0000 mg | ORAL_TABLET | Freq: Two times a day (BID) | ORAL | 3 refills | Status: DC

## 2017-02-28 NOTE — Progress Notes (Signed)
   Subjective:    Patient ID: Cindy Burns, female    DOB: 05-12-1946, 71 y.o.   MRN: [id number]  HPI  71 year old female presents for  Follow up on labs.   Hyponatremia. She has had persistently low sodium at last 2 checks.  she is on venlafaxine that can cause hyponatremia.  She does not restrict sodium in diet.  DM.[name] Poor control.. Recent CBG in 200s. She had 2 cups of coffee with cream and sugar.  FBS per pt at home 120-154  On glipizide 10 mg daily. She stopped metformin given concerns about this med in general.  She reports poor diet.. Especially given food stamp limits  She is currently in trial  For unknown Dm med.  Due for repeat eval a1c.  Lab Results  Component Value Date   HGBA1C 7.8 (H) 06/27/2016   Last A1C in clinical trial 7.3 AlC.  Hypertension:    Poor control  On no meds.  BP Readings from Last 3 Encounters:  02/28/17 (!) 173/89  12/27/16 140/76  06/27/16 (!) 144/86   Using medication without problems or lightheadedness: none Chest pain with exertion:none Edema:none Short of breath none Average home BPs: Other issues:   Clinical trials change crestor (was having SE) to zetia for cholesterol control.   Review of Systems     Objective:   Physical Exam  Constitutional: Vital signs are normal. She appears well-developed and well-nourished. She is cooperative.  Non-toxic appearance. She does not appear ill. No distress.  HENT:  Head: Normocephalic.  Right Ear: Hearing, tympanic membrane, external ear and ear canal normal. Tympanic membrane is not erythematous, not retracted and not bulging.  Left Ear: Hearing, tympanic membrane, external ear and ear canal normal. Tympanic membrane is not erythematous, not retracted and not bulging.  Nose: No mucosal edema or rhinorrhea. Right sinus exhibits no maxillary sinus tenderness and no frontal sinus tenderness. Left sinus exhibits no maxillary sinus tenderness and no frontal sinus tenderness.  Mouth/Throat:  Uvula is midline, oropharynx is clear and moist and mucous membranes are normal.  Eyes: Conjunctivae, EOM and lids are normal. Pupils are equal, round, and reactive to light. Lids are everted and swept, no foreign bodies found.  Neck: Trachea normal and normal range of motion. Neck supple. Carotid bruit is not present. No thyroid mass and no thyromegaly present.  Cardiovascular: Normal rate, regular rhythm, S1 normal, S2 normal, normal heart sounds, intact distal pulses and normal pulses.  Exam reveals no gallop and no friction rub.   No murmur heard. Pulmonary/Chest: Effort normal and breath sounds normal. No tachypnea. No respiratory distress. She has no decreased breath sounds. She has no wheezes. She has no rhonchi. She has no rales.  Abdominal: Soft. Normal appearance and bowel sounds are normal. There is no tenderness.  Neurological: She is alert.  Skin: Skin is warm, dry and intact. No rash noted.  Psychiatric: Her speech is normal and behavior is normal. Judgment and thought content normal. Her mood appears not anxious. Cognition and memory are normal. She does not exhibit a depressed mood.          Assessment & Plan:

## 2017-02-28 NOTE — Patient Instructions (Addendum)
Get blood pressure cuff. Follow Blood pressure daily and record measurments. Call in 2 weeks with BP measurements.. Goal < 140/90. Start Januvia for DM control.  Quit smoking with chantix.  Do not limit sodium.  Keep appt as scheduled.

## 2017-03-21 ENCOUNTER — Telehealth: Payer: Self-pay | Admitting: Family Medicine

## 2017-03-21 DIAGNOSIS — Z1159 Encounter for screening for other viral diseases: Secondary | ICD-10-CM

## 2017-03-21 DIAGNOSIS — D649 Anemia, unspecified: Secondary | ICD-10-CM

## 2017-03-21 DIAGNOSIS — E0842 Diabetes mellitus due to underlying condition with diabetic polyneuropathy: Secondary | ICD-10-CM

## 2017-03-21 DIAGNOSIS — E782 Mixed hyperlipidemia: Secondary | ICD-10-CM

## 2017-03-21 NOTE — Telephone Encounter (Signed)
-----   Message from Eustace Pen, LPN sent at 4/81/8563  4:19 PM EDT ----- Regarding: Labs 6/21 Hep C and A1C needed per health maintenance.  Please place lab orders.  [facility] Medicare

## 2017-03-27 ENCOUNTER — Ambulatory Visit: Payer: Medicare Other

## 2017-03-27 ENCOUNTER — Other Ambulatory Visit: Payer: Medicare Other

## 2017-03-31 ENCOUNTER — Other Ambulatory Visit (INDEPENDENT_AMBULATORY_CARE_PROVIDER_SITE_OTHER): Payer: Medicare Other

## 2017-03-31 DIAGNOSIS — D649 Anemia, unspecified: Secondary | ICD-10-CM | POA: Diagnosis not present

## 2017-03-31 DIAGNOSIS — Z1159 Encounter for screening for other viral diseases: Secondary | ICD-10-CM

## 2017-03-31 DIAGNOSIS — E0842 Diabetes mellitus due to underlying condition with diabetic polyneuropathy: Secondary | ICD-10-CM | POA: Diagnosis not present

## 2017-03-31 DIAGNOSIS — Z9189 Other specified personal risk factors, not elsewhere classified: Secondary | ICD-10-CM

## 2017-03-31 LAB — CBC WITH DIFFERENTIAL/PLATELET
Basophils Absolute: 0 10*3/uL (ref 0.0–0.1)
Basophils Relative: 0.8 % (ref 0.0–3.0)
Eosinophils Absolute: 0.1 10*3/uL (ref 0.0–0.7)
Eosinophils Relative: 2.8 % (ref 0.0–5.0)
HCT: 41.1 % (ref 36.0–46.0)
Hemoglobin: 14.4 g/dL (ref 12.0–15.0)
Lymphocytes Relative: 31.4 % (ref 12.0–46.0)
Lymphs Abs: 1.6 10*3/uL (ref 0.7–4.0)
MCHC: 35 g/dL (ref 30.0–36.0)
MCV: 92.5 fl (ref 78.0–100.0)
Monocytes Absolute: 0.2 10*3/uL (ref 0.1–1.0)
Monocytes Relative: 3.9 % (ref 3.0–12.0)
Neutro Abs: 3.1 10*3/uL (ref 1.4–7.7)
Neutrophils Relative %: 61.1 % (ref 43.0–77.0)
Platelets: 233 10*3/uL (ref 150.0–400.0)
RBC: 4.44 Mil/uL (ref 3.87–5.11)
RDW: 13.6 % (ref 11.5–15.5)
WBC: 5.1 10*3/uL (ref 4.0–10.5)

## 2017-03-31 LAB — MICROALBUMIN / CREATININE URINE RATIO
Creatinine,U: 115.7 mg/dL
Microalb Creat Ratio: 0.6 mg/g (ref 0.0–30.0)
Microalb, Ur: <0.7 mg/dL (ref 0.0–1.9)

## 2017-03-31 LAB — HEMOGLOBIN A1C: Hgb A1c MFr Bld: 7.3 % — ABNORMAL HIGH (ref 4.6–6.5)

## 2017-03-31 NOTE — Addendum Note (Signed)
Addended by: Ellamae Sia on: 03/31/2017 11:37 AM   Modules accepted: Orders

## 2017-04-01 LAB — HEPATITIS C ANTIBODY: HCV Ab: NEGATIVE

## 2017-04-03 ENCOUNTER — Ambulatory Visit (INDEPENDENT_AMBULATORY_CARE_PROVIDER_SITE_OTHER): Payer: Medicare Other | Admitting: Family Medicine

## 2017-04-03 ENCOUNTER — Encounter: Payer: Self-pay | Admitting: Family Medicine

## 2017-04-03 VITALS — BP 136/80 | HR 86 | Temp 98.3°F | Ht 61.5 in | Wt 131.5 lb

## 2017-04-03 DIAGNOSIS — E782 Mixed hyperlipidemia: Secondary | ICD-10-CM

## 2017-04-03 DIAGNOSIS — Z0000 Encounter for general adult medical examination without abnormal findings: Secondary | ICD-10-CM

## 2017-04-03 DIAGNOSIS — F331 Major depressive disorder, recurrent, moderate: Secondary | ICD-10-CM

## 2017-04-03 DIAGNOSIS — I10 Essential (primary) hypertension: Secondary | ICD-10-CM

## 2017-04-03 DIAGNOSIS — J449 Chronic obstructive pulmonary disease, unspecified: Secondary | ICD-10-CM | POA: Diagnosis not present

## 2017-04-03 DIAGNOSIS — E0842 Diabetes mellitus due to underlying condition with diabetic polyneuropathy: Secondary | ICD-10-CM | POA: Diagnosis not present

## 2017-04-03 DIAGNOSIS — F17209 Nicotine dependence, unspecified, with unspecified nicotine-induced disorders: Secondary | ICD-10-CM | POA: Diagnosis not present

## 2017-04-03 DIAGNOSIS — K439 Ventral hernia without obstruction or gangrene: Secondary | ICD-10-CM | POA: Diagnosis not present

## 2017-04-03 DIAGNOSIS — E1143 Type 2 diabetes mellitus with diabetic autonomic (poly)neuropathy: Secondary | ICD-10-CM | POA: Diagnosis not present

## 2017-04-03 DIAGNOSIS — F17200 Nicotine dependence, unspecified, uncomplicated: Secondary | ICD-10-CM

## 2017-04-03 LAB — HM DIABETES FOOT EXAM

## 2017-04-03 NOTE — Assessment & Plan Note (Signed)
Counseled on smoking cessation.. Continue chantix.

## 2017-04-03 NOTE — Patient Instructions (Addendum)
Stop spiriva.. Call insurance to determine if they cover Advair, Symbicort or Dulera for COPD control.  Call if you change your mind about changing depression medication. Great work with low carbohydrate diet and exercise.  Quit smoking. Please stop at the front desk to set up referral.

## 2017-04-03 NOTE — Assessment & Plan Note (Signed)
No incarceration, no pain or redness. Pt does not wish to repair.  Instructed pt on emergent symptoms and  When need to go to ER.

## 2017-04-03 NOTE — Assessment & Plan Note (Signed)
Not interested in med change at this time despite moderate control.

## 2017-04-03 NOTE — Progress Notes (Signed)
Subjective:    Patient ID: Cindy Burns, female    DOB: 01-Feb-1946, 71 y.o.   MRN: [id number]  HPI   The patient presents for complete physical and review of chronic health problems.  SHE WILL RETURN TO SEE LEISA PINSON FOR AMW AT A LATER DATE. He/She also has the following acute concerns today: She has noted area of abdomen poking out on right abdomen.. Present ever since gallbladder surgery.. But  Bigger in last 2 month. No soreness, no redness.   Diabetes:  Improved control on januvia  and glipizide as well as research medication. Lab Results  Component Value Date   HGBA1C 7.3 (H) 03/31/2017  Using medications without difficulties: Hypoglycemic episodes:none Hyperglycemic episodes: arely only after she eats something sugary Feet problems: neuropathy stable.. No numbness Blood Sugars averaging: FBS  129  eye exam within last year: 12/24/2016  Major depressive disorder: moderate control on venlafaxine. PHQ9: 11  She is not interested in changing medication at this time.  Hypertension:   BP at goal on   No medication. BP Readings from Last 3 Encounters:  04/03/17 136/80  02/28/17 (!) 173/89  12/27/16 140/76  Using medication without problems or lightheadedness:  none Chest pain with exertion:none Edema:none Short of breath: mild Average home BPs:  Other issues:  Elevated Cholesterol:  On zetia  Diet compliance: moderate Exercise: walking 2 times a week Other complaints:  Mild COPD:  She feels spiriva has not helped much.  Still smoking .[name] On chantix.   Social History /Family History/Past Medical History reviewed in detail and updated in EMR if needed. Blood pressure 136/80, pulse 86, temperature 98.3 F (36.8 C), temperature source Oral, height 5' 1.5" (1.562 m), weight 131 lb 8 oz (59.6 kg), SpO2 97 %.   Review of Systems  Constitutional: Negative for fatigue and fever.  HENT: Negative for congestion.   Eyes: Negative for pain.  Respiratory: Positive for  shortness of breath. Negative for cough.   Cardiovascular: Negative for chest pain, palpitations and leg swelling.  Gastrointestinal: Negative for abdominal pain.  Genitourinary: Negative for dysuria and vaginal bleeding.  Musculoskeletal: Negative for back pain.  Neurological: Negative for syncope, light-headedness and headaches.  Psychiatric/Behavioral: Positive for dysphoric mood and sleep disturbance. Negative for suicidal ideas.       Objective:   Physical Exam  Constitutional: Vital signs are normal. She appears well-developed and well-nourished. She is cooperative.  Non-toxic appearance. She does not appear ill. No distress.  HENT:  Head: Normocephalic.  Right Ear: Hearing, tympanic membrane, external ear and ear canal normal. Tympanic membrane is not erythematous, not retracted and not bulging.  Left Ear: Hearing, tympanic membrane, external ear and ear canal normal. Tympanic membrane is not erythematous, not retracted and not bulging.  Nose: No mucosal edema or rhinorrhea. Right sinus exhibits no maxillary sinus tenderness and no frontal sinus tenderness. Left sinus exhibits no maxillary sinus tenderness and no frontal sinus tenderness.  Mouth/Throat: Uvula is midline, oropharynx is clear and moist and mucous membranes are normal.  Eyes: Conjunctivae, EOM and lids are normal. Pupils are equal, round, and reactive to light. Lids are everted and swept, no foreign bodies found.  Neck: Trachea normal and normal range of motion. Neck supple. Carotid bruit is not present. No thyroid mass and no thyromegaly present.  Cardiovascular: Normal rate, regular rhythm, S1 normal, S2 normal, normal heart sounds, intact distal pulses and normal pulses.  Exam reveals no gallop and no friction rub.   No murmur heard.  Pulmonary/Chest: Effort normal. No tachypnea. No respiratory distress. She has decreased breath sounds. She has no wheezes. She has no rhonchi. She has no rales.  Abdominal: Soft. Normal  appearance and bowel sounds are normal. There is no tenderness. A hernia is present. Hernia confirmed positive in the ventral area.  Easily reducible hernia  Neurological: She is alert.  Skin: Skin is warm, dry and intact. No rash noted.  Psychiatric: Her speech is normal and behavior is normal. Judgment and thought content normal. Her mood appears not anxious. Cognition and memory are normal. She does not exhibit a depressed mood.     Diabetic foot exam: Normal inspection No skin breakdown No calluses  Normal DP pulses Normal sensation to light touch and monofilament Nails thickened      Assessment & Plan:  The patient's preventative maintenance and recommended screening tests for an annual wellness exam were reviewed in full today. Brought up to date unless services declined.  Counselled on the importance of diet, exercise, and its role in overall health and mortality. The patient's FH and SH was reviewed, including their home life, tobacco status, and drug and alcohol status.   Vaccines: uptodate with td, PCV 13 and 23 and shingles due (will consider) DEXA:  nml in 2005,  Osteopenia in 2015, repeat in 2020  Mammo: Hx of breast cancer, s/p lumpectomy, chemo and radiation, last mammogram 2010 SHE REFUSES.  Colon: nml 2006  Repeat overdue.[name] Desire cologuard. PAP/DVE: no pap indicated, DVE not indcated as no family history of ovarian cancer  Smoker:  She has 50 pack year history. Last spirometry 08/2015 nml  Chest CT: 01/2014 IMPRESSION:  1. Stable small lung nodules. No new lung nodules.  2. Stable areas of lung scarring and paraseptal emphysema.  3. No acute findings in the lungs.  4. No evidence metastatic disease. No change from the previous CT.  5. Lung nodules should be considered benign needing no additional  followup evaluation.  SHE IS A CANDIDATE FOR LUNG CANCER SCREENING PROGRAM.

## 2017-04-03 NOTE — Assessment & Plan Note (Signed)
Stable on gabapentin. Needs continue better control of DM.

## 2017-04-03 NOTE — Assessment & Plan Note (Signed)
Well controlled. Continue current medication.  

## 2017-04-03 NOTE — Assessment & Plan Note (Signed)
Wishes to stop spiriva.. She will look into other options covered by insurance. She will let us know what they cover.

## 2017-04-03 NOTE — Assessment & Plan Note (Signed)
Improving control on current regimen. A.eblife

## 2017-04-08 NOTE — Progress Notes (Deleted)
 Subjective:   Cindy Burns is a 70 y.o. female who presents for Medicare Annual (Subsequent) preventive examination.  Review of Systems:  No ROS.  Medicare Wellness Visit. Additional risk factors are reflected in the social history.        Objective:     Vitals: There were no vitals taken for this visit.  There is no height or weight on file to calculate BMI.   Tobacco History  Smoking Status  . Current Every Day Smoker  . Packs/day: 0.03  . Years: 40.00  . Types: Cigarettes  Smokeless Tobacco  . Never Used     Ready to quit: Not Answered Counseling given: Not Answered   Past Medical History:  Diagnosis Date  . Cancer (HCC) 2005   Breast Cancer  . COPD (chronic obstructive pulmonary disease) (HCC)   . Depression   . Diabetes mellitus without complication (HCC)   . Hyperlipidemia    Past Surgical History:  Procedure Laterality Date  . BREAST SURGERY  2005  . CATARACT EXTRACTION    . CHOLECYSTECTOMY  2013   Family History  Problem Relation Age of Onset  . Arthritis Mother   . Alzheimer's disease Mother   . Dementia Father   . COPD Brother   . COPD Brother    History  Sexual Activity  . Sexual activity: No    Outpatient Encounter Prescriptions as of 04/18/2017  Medication Sig  . Blood Glucose Monitoring Suppl (ACCU-CHEK NANO SMARTVIEW) w/Device KIT Use to check blood sugar daily.  E11.9  . ezetimibe (ZETIA) 10 MG tablet Take 10 mg by mouth daily.   . gabapentin (NEURONTIN) 400 MG capsule Take 2 capsules by mouth at bedtime  . glipiZIDE (GLUCOTROL XL) 10 MG 24 hr tablet TAKE ONE TABLET BY MOUTH EVERY MORNING  . glucose blood (ACCU-CHEK SMARTVIEW) test strip Use to check blood sugar daily.  E11.9  . Lancets (ONETOUCH ULTRASOFT) lancets Use to test blood sugar once daily. DX: E11.9  . sitaGLIPtin (JANUVIA) 100 MG tablet Take 1 tablet (100 mg total) by mouth daily.  . varenicline (CHANTIX CONTINUING MONTH PAK) 1 MG tablet Take 1 tablet (1 mg total) by  mouth 2 (two) times daily.  . varenicline (CHANTIX STARTING MONTH PAK) 0.5 MG X 11 & 1 MG X 42 tablet Take one 0.5 mg tab daily for 3 days,then inc. to one 0.5 mg tab BID x 4 days, then inc. to one 1 mg tab BID.  . venlafaxine XR (EFFEXOR-XR) 75 MG 24 hr capsule Take 1 capsule by mouth  daily with breakfast   No facility-administered encounter medications on file as of 04/18/2017.     Activities of Daily Living No flowsheet data found.  Patient Care Team: Bedsole, Amy E, MD as PCP - General    Assessment:    Physical assessment deferred to PCP.  Exercise Activities and Dietary recommendations    Goals    None     Fall Risk Fall Risk  04/03/2017 04/03/2017 04/22/2014  Falls in the past year? No No No   Depression Screen PHQ 2/9 Scores 04/03/2017 04/22/2014  PHQ - 2 Score 3 2  PHQ- 9 Score 11 8     Cognitive Function        Immunization History  Administered Date(s) Administered  . Influenza,inj,Quad PF,36+ Mos 08/12/2013, 08/05/2014, 08/18/2015, 06/27/2016  . Pneumococcal Conjugate-13 04/22/2014  . Pneumococcal Polysaccharide-23 08/18/2015  . Td 10/07/1997, 02/28/2009  . Tdap 08/08/2014   Screening Tests Health Maintenance    Topic Date Due  . COLONOSCOPY  08/06/1998  . MAMMOGRAM  12/27/2017 (Originally 08/06/1998)  . INFLUENZA VACCINE  05/07/2017  . HEMOGLOBIN A1C  09/30/2017  . OPHTHALMOLOGY EXAM  12/24/2017  . URINE MICROALBUMIN  03/31/2018  . FOOT EXAM  04/03/2018  . DEXA SCAN  05/26/2019  . TETANUS/TDAP  08/08/2024  . Hepatitis C Screening  Completed  . PNA vac Low Risk Adult  Completed      Plan:    Follow-up w/ PCP as scheduled.   I have personally reviewed and noted the following in the patient's chart:   . Medical and social history . Use of alcohol, tobacco or illicit drugs  . Current medications and supplements . Functional ability and status . Nutritional status . Physical activity . Advanced directives . List of other  physicians . Vitals . Screenings to include cognitive, depression, and falls . Referrals and appointments  In addition, I have reviewed and discussed with patient certain preventive protocols, quality metrics, and best practice recommendations. A written personalized care plan for preventive services as well as general preventive health recommendations were provided to patient.     Dorrene German, RN  04/08/2017

## 2017-04-10 NOTE — Progress Notes (Deleted)
PCP notes:   Health maintenance:   Abnormal screenings:    Patient concerns:    Nurse concerns:   Next PCP appt:    

## 2017-04-15 NOTE — Assessment & Plan Note (Signed)
Now on zetia given SE to crestor. Re-eval in 3 months.

## 2017-04-15 NOTE — Assessment & Plan Note (Signed)
Poor control.ON study medication. Add [place].  Encouraged exercise, weight loss, healthy eating habits.   Follow up in 3 months.

## 2017-04-15 NOTE — Assessment & Plan Note (Signed)
Follow BP at home.  Call if > 140/90 consistently.  ? Related to study drug she may be taking.

## 2017-04-18 ENCOUNTER — Ambulatory Visit: Payer: Medicare Other

## 2017-04-19 ENCOUNTER — Other Ambulatory Visit: Payer: Self-pay | Admitting: Family Medicine

## 2017-04-19 NOTE — Telephone Encounter (Signed)
Last office visit 04/03/17.  Last refilled 04/04/16 for #180 with 1 refill.  Ok to refill?

## 2017-05-05 ENCOUNTER — Other Ambulatory Visit: Payer: Self-pay | Admitting: Family Medicine

## 2017-08-12 ENCOUNTER — Other Ambulatory Visit: Payer: Self-pay | Admitting: Family Medicine

## 2017-09-09 ENCOUNTER — Other Ambulatory Visit: Payer: Self-pay | Admitting: Family Medicine

## 2017-10-23 DIAGNOSIS — J441 Chronic obstructive pulmonary disease with (acute) exacerbation: Secondary | ICD-10-CM | POA: Diagnosis not present

## 2017-10-28 ENCOUNTER — Other Ambulatory Visit: Payer: Self-pay | Admitting: Family Medicine

## 2017-10-28 NOTE — Telephone Encounter (Signed)
Last office visit 04/03/2017.  Last refilled 02/28/2017 for #60 with no refills.  Ok to refill?

## 2017-11-19 DIAGNOSIS — R030 Elevated blood-pressure reading, without diagnosis of hypertension: Secondary | ICD-10-CM | POA: Diagnosis not present

## 2017-11-19 DIAGNOSIS — L0100 Impetigo, unspecified: Secondary | ICD-10-CM | POA: Diagnosis not present

## 2017-11-19 DIAGNOSIS — L03211 Cellulitis of face: Secondary | ICD-10-CM | POA: Diagnosis not present

## 2017-12-26 ENCOUNTER — Ambulatory Visit: Payer: Medicare Other | Admitting: Family Medicine

## 2018-01-06 ENCOUNTER — Other Ambulatory Visit: Payer: Self-pay

## 2018-01-06 ENCOUNTER — Encounter: Payer: Self-pay | Admitting: Family Medicine

## 2018-01-06 ENCOUNTER — Ambulatory Visit (INDEPENDENT_AMBULATORY_CARE_PROVIDER_SITE_OTHER): Payer: Medicare Other | Admitting: Family Medicine

## 2018-01-06 VITALS — BP 150/82 | HR 88 | Temp 97.8°F | Ht 61.5 in | Wt 136.8 lb

## 2018-01-06 DIAGNOSIS — J449 Chronic obstructive pulmonary disease, unspecified: Secondary | ICD-10-CM | POA: Diagnosis not present

## 2018-01-06 DIAGNOSIS — I10 Essential (primary) hypertension: Secondary | ICD-10-CM

## 2018-01-06 DIAGNOSIS — E782 Mixed hyperlipidemia: Secondary | ICD-10-CM

## 2018-01-06 DIAGNOSIS — E0842 Diabetes mellitus due to underlying condition with diabetic polyneuropathy: Secondary | ICD-10-CM | POA: Diagnosis not present

## 2018-01-06 DIAGNOSIS — E1143 Type 2 diabetes mellitus with diabetic autonomic (poly)neuropathy: Secondary | ICD-10-CM | POA: Diagnosis not present

## 2018-01-06 LAB — HM DIABETES FOOT EXAM

## 2018-01-06 MED ORDER — LOSARTAN POTASSIUM 25 MG PO TABS: 25.0000 mg | ORAL_TABLET | Freq: Every day | ORAL | 11 refills | Status: DC

## 2018-01-06 NOTE — Assessment & Plan Note (Signed)
Poor control.. Start ARB ( given COPD will not use ACEI)  follow up in 2 weeks.

## 2018-01-06 NOTE — Assessment & Plan Note (Signed)
Due for re-eval on zetia and study drug

## 2018-01-06 NOTE — Assessment & Plan Note (Signed)
Likely poor control.[name] Re-eval with A1C. Will likely need an additional med Encouraged exercise, weight loss, healthy eating habits.

## 2018-01-06 NOTE — Patient Instructions (Addendum)
Follow BP at home.. Goal < 140/90. Get back on track with low carb diet, increase exercise as able.  Set up yearly eye exam as able.   Quit smoking.  Start low dose losartan daily forBP.

## 2018-01-06 NOTE — Progress Notes (Signed)
Subjective:    Patient ID: Cindy Burns, female    DOB: 02-07-1946, 72 y.o.   MRN: [id number]  HPI     72 year old female presents for follow up DM and HTN, cholesterol  Diabetes:   Due for re-eval. Previously poor control   Pt now on januvia, glucotrol on study drug Lab Results  Component Value Date   HGBA1C 7.3 (H) 03/31/2017  Using medications without difficulties: Hypoglycemic episodes:no Hyperglycemic episodes:yes Feet problems: Has DM associated neuropathy.. On gabapentin No ulcers seen. Blood Sugars averaging: FBS 168-200 eye exam within last year: Now walking some.  Eats minimmaly but poor choices, bacon, eggs, hamburger, some salads for lunch. Wt Readings from Last 3 Encounters:  01/06/18 136 lb 12 oz (62 kg)  04/03/17 131 lb 8 oz (59.6 kg)  02/28/17 135 lb 8 oz (61.5 kg)    Hypertension:   Inadequate control in office today on no med. Using medication without problems or lightheadedness:  none Chest pain with exertion:none Edema:none Short of breath:  stable Average home BPs:  Uses wrist cuff.. 180-190/  She is in a study. Other issues:   Elevated Cholesterol:  OVerdue for re-eval on zetia  SE to crestor in apst Lab Results  Component Value Date   CHOL 120 02/23/2016   HDL 36.20 (L) 02/23/2016   LDLCALC 26 12/17/2013   LDLDIRECT 42.0 02/23/2016   TRIG 332.0 (H) 02/23/2016   CHOLHDL 3 02/23/2016  Using medications without problems: Muscle aches:  Diet compliance: Exercise: Other complaints:  Blood pressure (!) 150/82, pulse 88, temperature 97.8 F (36.6 C), temperature source Oral, height 5' 1.5" (1.562 m), weight 136 lb 12 oz (62 kg).    Review of Systems  Constitutional: Negative for fatigue and fever.  HENT: Negative for congestion.   Eyes: Negative for pain.  Respiratory: Positive for cough and shortness of breath.        Chronic  Cardiovascular: Negative for chest pain, palpitations and leg swelling.  Gastrointestinal: Negative for  abdominal pain.  Genitourinary: Negative for dysuria and vaginal bleeding.  Musculoskeletal: Negative for back pain.  Neurological: Negative for syncope, light-headedness and headaches.  Psychiatric/Behavioral: Negative for dysphoric mood.       Objective:   Physical Exam  Constitutional: Vital signs are normal. She appears well-developed and well-nourished. She is cooperative.  Non-toxic appearance. She does not appear ill. No distress.  HENT:  Head: Normocephalic.  Right Ear: Hearing, tympanic membrane, external ear and ear canal normal. Tympanic membrane is not erythematous, not retracted and not bulging.  Left Ear: Hearing, tympanic membrane, external ear and ear canal normal. Tympanic membrane is not erythematous, not retracted and not bulging.  Nose: No mucosal edema or rhinorrhea. Right sinus exhibits no maxillary sinus tenderness and no frontal sinus tenderness. Left sinus exhibits no maxillary sinus tenderness and no frontal sinus tenderness.  Mouth/Throat: Uvula is midline, oropharynx is clear and moist and mucous membranes are normal.  Eyes: Pupils are equal, round, and reactive to light. Conjunctivae, EOM and lids are normal. Lids are everted and swept, no foreign bodies found.  Neck: Trachea normal and normal range of motion. Neck supple. Carotid bruit is not present. No thyroid mass and no thyromegaly present.  Cardiovascular: Normal rate, regular rhythm, S1 normal, S2 normal, normal heart sounds, intact distal pulses and normal pulses. Exam reveals no gallop and no friction rub.  No murmur heard. Pulmonary/Chest: Effort normal. No tachypnea. No respiratory distress. She has no decreased breath sounds. She  has wheezes. She has no rhonchi. She has no rales.   Scattered wheeze  Abdominal: Soft. Normal appearance and bowel sounds are normal. There is no tenderness.  Neurological: She is alert.  Skin: Skin is warm, dry and intact. No rash noted.  Psychiatric: Her speech is normal  and behavior is normal. Judgment and thought content normal. Her mood appears not anxious. Cognition and memory are normal. She does not exhibit a depressed mood.          Assessment & Plan:

## 2018-01-06 NOTE — Assessment & Plan Note (Signed)
Pt will retry cessation. Discuss med in detail at next OV. Consider repeat spirometry.

## 2018-01-13 ENCOUNTER — Other Ambulatory Visit (INDEPENDENT_AMBULATORY_CARE_PROVIDER_SITE_OTHER): Payer: Medicare Other

## 2018-01-13 DIAGNOSIS — E0842 Diabetes mellitus due to underlying condition with diabetic polyneuropathy: Secondary | ICD-10-CM | POA: Diagnosis not present

## 2018-01-13 DIAGNOSIS — E782 Mixed hyperlipidemia: Secondary | ICD-10-CM | POA: Diagnosis not present

## 2018-01-13 LAB — HEMOGLOBIN A1C: Hgb A1c MFr Bld: 8.5 % — ABNORMAL HIGH (ref 4.6–6.5)

## 2018-01-13 LAB — COMPREHENSIVE METABOLIC PANEL
ALT: 18 U/L (ref 0–35)
AST: 21 U/L (ref 0–37)
Albumin: 4.3 g/dL (ref 3.5–5.2)
Alkaline Phosphatase: 31 U/L — ABNORMAL LOW (ref 39–117)
BUN: 7 mg/dL (ref 6–23)
CO2: 27 mEq/L (ref 19–32)
Calcium: 9.3 mg/dL (ref 8.4–10.5)
Chloride: 103 mEq/L (ref 96–112)
Creatinine, Ser: 0.72 mg/dL (ref 0.40–1.20)
GFR: 85.25 mL/min (ref >60.00)
Glucose, Bld: 139 mg/dL — ABNORMAL HIGH (ref 70–99)
Potassium: 4.3 mEq/L (ref 3.5–5.1)
Sodium: 136 mEq/L (ref 135–145)
Total Bilirubin: 0.4 mg/dL (ref 0.2–1.2)
Total Protein: 7.1 g/dL (ref 6.0–8.3)

## 2018-01-13 LAB — LIPID PANEL
Cholesterol: 199 mg/dL (ref 0–200)
HDL: 35.6 mg/dL — ABNORMAL LOW (ref >39.00)
LDL Cholesterol: 127 mg/dL — ABNORMAL HIGH (ref 0–99)
NonHDL: 163.09
Total CHOL/HDL Ratio: 6
Triglycerides: 182 mg/dL — ABNORMAL HIGH (ref 0.0–149.0)
VLDL: 36.4 mg/dL (ref 0.0–40.0)

## 2018-01-13 LAB — MICROALBUMIN / CREATININE URINE RATIO
Creatinine,U: 41 mg/dL
Microalb Creat Ratio: 1.7 mg/g (ref 0.0–30.0)
Microalb, Ur: <0.7 mg/dL (ref 0.0–1.9)

## 2018-01-19 ENCOUNTER — Telehealth: Payer: Self-pay | Admitting: Family Medicine

## 2018-01-19 NOTE — Telephone Encounter (Signed)
Copied from [place] [id number]. Topic: Quick Communication - See Telephone Encounter >> Jan 19, 2018  2:56 PM Margot Ables wrote: CRM for notification. See Telephone encounter for: 01/19/18. Pt has been in an ongoing trial. They received a recent OV note but it doesn't look like the others and seems incomplete. They need an update of the change of HTN medication and a complete medication list. Please fax to [phone].

## 2018-01-19 NOTE — Telephone Encounter (Signed)
Last office visit note from 01/06/2018 and medication list faxed to [place] at [phone].

## 2018-01-30 ENCOUNTER — Ambulatory Visit: Payer: Medicare Other | Admitting: Family Medicine

## 2018-02-11 ENCOUNTER — Telehealth: Payer: Self-pay | Admitting: Family Medicine

## 2018-02-11 NOTE — Telephone Encounter (Signed)
Copied from [place] [phone]. Topic: Quick Communication - See Telephone Encounter >> Feb 11, 2018  3:00 PM Synthia Innocent wrote: CRM for notification. See Telephone encounter for: 02/11/18. [employer] calling requesting DM care, patient not taking a high dose of statin. Please advise

## 2018-02-12 ENCOUNTER — Other Ambulatory Visit: Payer: Self-pay | Admitting: Family Medicine

## 2018-02-12 NOTE — Telephone Encounter (Signed)
Last office visit 01/06/2018.  Last refilled 02/28/2017 for #53 with no refills.  Refill?

## 2018-02-12 NOTE — Telephone Encounter (Signed)
Noted  

## 2018-02-24 ENCOUNTER — Other Ambulatory Visit: Payer: Self-pay | Admitting: Family Medicine

## 2018-03-27 ENCOUNTER — Other Ambulatory Visit: Payer: Self-pay | Admitting: Family Medicine

## 2018-03-31 ENCOUNTER — Other Ambulatory Visit: Payer: Self-pay | Admitting: Family Medicine

## 2018-04-01 ENCOUNTER — Other Ambulatory Visit: Payer: Self-pay | Admitting: *Deleted

## 2018-04-01 MED ORDER — LOSARTAN POTASSIUM 25 MG PO TABS: 25.0000 mg | ORAL_TABLET | Freq: Every day | ORAL | 1 refills | Status: DC

## 2018-04-21 ENCOUNTER — Other Ambulatory Visit: Payer: Self-pay | Admitting: Family Medicine

## 2018-04-22 NOTE — Telephone Encounter (Signed)
Electronic refill request Last office visit 01/06/18 Last refill 04/21/17 #180/1

## 2018-07-16 ENCOUNTER — Other Ambulatory Visit: Payer: Self-pay

## 2018-07-16 NOTE — Patient Outreach (Signed)
[facility]) Care Management  07/16/2018  Cindy Burns 01-11-1946 [id number]   Medication Adherence call to Cindy Burns left a message for patient to call back patient is due on Glipizide Er 10 mg and Losartan 25 mg under [place].   [place] Management Direct Dial [phone]  Fax [phone] [email or web address]

## 2018-07-23 ENCOUNTER — Telehealth: Payer: Self-pay | Admitting: *Deleted

## 2018-07-23 NOTE — Telephone Encounter (Signed)
Copied from [place] [id number]. Topic: General - Other >> Jul 23, 2018 11:19 AM Ivar Drape wrote: Reason for CRM:   Molli Knock w/Dr. Willaim Rayas at [place] [phone] would like a call back from Dr. Esmeralda Arthur medical assistant.

## 2018-07-23 NOTE — Telephone Encounter (Signed)
Spoke with Molli Knock from [place].  They recently saw Ms. Bryngelson and did labs on her which they have faxed over to Dr. Diona Browner.  Dr. Kinnie Scales thinks Ms. Haubner has a UTI and would like Dr. Diona Browner be the one to treat patient.  They also ask that we fax them over what patient will be treated with so they can add that to her file at [phone].  Labs placed in Dr. Rometta Emery in box to review.

## 2018-07-24 NOTE — Telephone Encounter (Signed)
Left message for Cindy Burns to return call.  Ok to [facility] Triage to speak with patient when she calls back and find out if she is having dysuria, frequency, urge or urinary change.  Any fever?

## 2018-07-24 NOTE — Telephone Encounter (Signed)
Tried reaching patient again.  Left voicemail to please try and return our call before 5:00 pm today if she can.

## 2018-07-24 NOTE — Telephone Encounter (Signed)
Call pt.. Is she having dysuria, freq, urge or urinary change, any fever?  Did they send urine for culture?   UA showed pos nitrite , mod LE, no blood

## 2018-07-27 NOTE — Telephone Encounter (Signed)
Left message for Ms. Parkey to return call.  Ok to [facility] Triage to speak with patient when she calls back and find out if she is having dysuria, frequency, urge,  urinary change or fever?

## 2018-07-28 NOTE — Telephone Encounter (Signed)
Pt. Returned call; as call was being transferred to Nurse Triage, the call disconnected.  Attempted to return call to pt.; left voice message to return call to office re: discussion of symptoms she may be experiencing.

## 2018-07-28 NOTE — Telephone Encounter (Signed)
Pt returning call to office and states she is not currently experiencing any symptoms. Pt denies dysuria, frequency, urgency, fever or any urinary changes.

## 2018-07-29 NOTE — Telephone Encounter (Signed)
In that case, I doubt true UTI, likley contaminant of urine. Increase water intake. If she has further concerns have her make an appt for UA, micro and culture eval of urine.

## 2018-07-29 NOTE — Telephone Encounter (Signed)
Left detailed message for Cindy Burns that since she is asymptomatic Dr. Diona Browner  doubts it is a true UTI.   Likley a contaminated urine.  Advised to increase water intake. If she has further concerns or does start experiencing symptoms,  she should make an appt for UA, micro and culture evaluation of urine.

## 2018-07-31 ENCOUNTER — Telehealth: Payer: Self-pay

## 2018-07-31 NOTE — Telephone Encounter (Signed)
Left message for patient to call back, need AWV with Lattie Haw and CPE with Dr Diona Browner scheduled for 2019 Kris Mouton, RMA

## 2018-08-12 ENCOUNTER — Other Ambulatory Visit: Payer: Self-pay | Admitting: Family Medicine

## 2018-08-12 ENCOUNTER — Telehealth: Payer: Self-pay

## 2018-08-12 NOTE — Telephone Encounter (Signed)
Pt walked in at 430pm saying she had called the office to ask for an antibiotic for a possible UTI and our message said to go to an urgent care. Pt walked in asking for a message be sent for an antibiotic. I advised her that she would need to be seen before anyone would send in an antibiotic especially because she does not have a history of them. I offered to schedule her an appointment tomorrow with the option of going to an Urgent Care tonight. She said she would go to an urgent care.

## 2018-08-13 NOTE — Telephone Encounter (Signed)
Agreed .[name] Pt needs appt  To evaluate.

## 2018-08-13 NOTE — Telephone Encounter (Signed)
Last office visit 01/06/2018.  AVS states to follow up in 2 weeks for HTN and COPD.  No future appointments.  Last CPE 04/03/2017.  Message was left on 07/31/2018 to call and schedule MWV. Refill?

## 2018-08-17 ENCOUNTER — Telehealth: Payer: Self-pay | Admitting: Family Medicine

## 2018-08-17 NOTE — Telephone Encounter (Signed)
Called pt to reschedule 11/12 appt. Needs to be scheduled with PCP.

## 2018-08-18 ENCOUNTER — Encounter: Payer: Self-pay | Admitting: Family Medicine

## 2018-08-18 ENCOUNTER — Ambulatory Visit (INDEPENDENT_AMBULATORY_CARE_PROVIDER_SITE_OTHER): Payer: Medicare Other | Admitting: Family Medicine

## 2018-08-18 ENCOUNTER — Ambulatory Visit: Payer: Medicare Other | Admitting: Internal Medicine

## 2018-08-18 VITALS — BP 150/80 | HR 100 | Temp 97.6°F | Ht 61.5 in | Wt 134.5 lb

## 2018-08-18 DIAGNOSIS — E0842 Diabetes mellitus due to underlying condition with diabetic polyneuropathy: Secondary | ICD-10-CM | POA: Diagnosis not present

## 2018-08-18 DIAGNOSIS — J449 Chronic obstructive pulmonary disease, unspecified: Secondary | ICD-10-CM

## 2018-08-18 DIAGNOSIS — E782 Mixed hyperlipidemia: Secondary | ICD-10-CM | POA: Diagnosis not present

## 2018-08-18 DIAGNOSIS — I10 Essential (primary) hypertension: Secondary | ICD-10-CM | POA: Diagnosis not present

## 2018-08-18 LAB — POCT GLYCOSYLATED HEMOGLOBIN (HGB A1C): Hemoglobin A1C: 7.6 % — AB (ref 4.0–5.6)

## 2018-08-18 LAB — COMPREHENSIVE METABOLIC PANEL
ALT: 13 U/L (ref 0–35)
AST: 15 U/L (ref 0–37)
Albumin: 4.4 g/dL (ref 3.5–5.2)
Alkaline Phosphatase: 34 U/L — ABNORMAL LOW (ref 39–117)
BUN: 10 mg/dL (ref 6–23)
CO2: 26 mEq/L (ref 19–32)
Calcium: 9.4 mg/dL (ref 8.4–10.5)
Chloride: 98 mEq/L (ref 96–112)
Creatinine, Ser: 0.76 mg/dL (ref 0.40–1.20)
GFR: 79.96 mL/min (ref >60.00)
Glucose, Bld: 217 mg/dL — ABNORMAL HIGH (ref 70–99)
Potassium: 4.6 mEq/L (ref 3.5–5.1)
Sodium: 131 mEq/L — ABNORMAL LOW (ref 135–145)
Total Bilirubin: 0.4 mg/dL (ref 0.2–1.2)
Total Protein: 7.2 g/dL (ref 6.0–8.3)

## 2018-08-18 LAB — HM DIABETES FOOT EXAM

## 2018-08-18 LAB — LIPID PANEL
Cholesterol: 207 mg/dL — ABNORMAL HIGH (ref 0–200)
HDL: 40.4 mg/dL (ref >39.00)
NonHDL: 166.98
Total CHOL/HDL Ratio: 5
Triglycerides: 250 mg/dL — ABNORMAL HIGH (ref 0.0–149.0)
VLDL: 50 mg/dL — ABNORMAL HIGH (ref 0.0–40.0)

## 2018-08-18 LAB — LDL CHOLESTEROL, DIRECT: Direct LDL: 156 mg/dL

## 2018-08-18 MED ORDER — ALBUTEROL SULFATE HFA 108 (90 BASE) MCG/ACT IN AERS: 2.0000 | INHALATION_SPRAY | Freq: Four times a day (QID) | RESPIRATORY_TRACT | 2 refills | Status: DC | PRN

## 2018-08-18 MED ORDER — VENLAFAXINE HCL ER 75 MG PO CP24: 75.0000 mg | ORAL_CAPSULE | Freq: Every day | ORAL | 1 refills | Status: DC

## 2018-08-18 MED ORDER — BUDESONIDE-FORMOTEROL FUMARATE 160-4.5 MCG/ACT IN AERO: 2.0000 | INHALATION_SPRAY | Freq: Two times a day (BID) | RESPIRATORY_TRACT | 3 refills | Status: DC

## 2018-08-18 NOTE — Assessment & Plan Note (Signed)
Due for re-eval. IF not at goal.. Add low dose statin in addition to zetia and study dryg.

## 2018-08-18 NOTE — Assessment & Plan Note (Addendum)
Repeat spirometry today: showed moderate obstruction.  No benefit from spiriva.  Add symbicort and re-eval in 3 months.

## 2018-08-18 NOTE — Assessment & Plan Note (Signed)
Stable on gabapentin.   

## 2018-08-18 NOTE — Patient Instructions (Addendum)
Work on low salt diet. Continue losartan low dose for now.  Get a better fitting cuff for home measurement. Call with measurements in next. Set up yearly eye exam.  Keep up the great work on low carb diet, increase exercise as able.   Please stop at the lab to have labs drawn.  Start symbicort for control everyday.. Can use albuterol as needed for rescue ( coughing fits) limit use

## 2018-08-18 NOTE — Assessment & Plan Note (Signed)
Improving control on current regimen with lifestyle changes.  Re-eval in 3 months.. If not at goal add additional med.

## 2018-08-18 NOTE — Assessment & Plan Note (Addendum)
?   White coat HTN element?  Work on low salt diet. Continue losartan low dose for now.  Get a better fitting cuff for home measurement.

## 2018-08-18 NOTE — Progress Notes (Signed)
Subjective:    Patient ID: Cindy Burns, female    DOB: [birth year], 72 y.o.   MRN: [id number]  HPI   72 year old female presents for follow up  Diabetes:   Improved control on januvia and glipizide.  She has been working  On diet and exercise more. Lab Results  Component Value Date   HGBA1C 7.6 (A) 08/18/2018  Using medications without difficulties: Hypoglycemic episodes:none Hyperglycemic episodes: none Feet problems: no ulcers Blood Sugars averaging: FBS 144 eye exam within last year: due  Wt Readings from Last 3 Encounters:  08/18/18 134 lb 8 oz (61 kg)  01/06/18 136 lb 12 oz (62 kg)  04/03/17 131 lb 8 oz (59.6 kg)     Hypertension:   inadequate control  on losartan 25 mg daily.. ? Some component of white coat HTN. BP Readings from Last 3 Encounters:  08/18/18 (!) 150/80  01/06/18 (!) 150/82  04/03/17 136/80  Using medication without problems or lightheadedness:  none Chest pain with exertion: none Edema:none Short of breath:yes. Average home BPs: She reports fluctuating BP.[name] [facility] occ 140/90... [facility]. Other issues:  Elevated Cholesterol:  Inadequate control last check. Despite zetia and she is in a study on a study drug for cholesterol.  SE to crestor in past but had better control.  Lab Results  Component Value Date   CHOL 199 01/13/2018   HDL 35.60 (L) 01/13/2018   LDLCALC 127 (H) 01/13/2018   LDLDIRECT 42.0 02/23/2016   TRIG 182.0 (H) 01/13/2018   CHOLHDL 6 01/13/2018  Using medications without problems: Muscle aches:  Diet compliance: eating healthier Exercise: walking more Other complaints:   She has not yet quit smoking.  COPD, mild: Does have coughing fits, no infections, has stable SOB. Cannot afford spiriva but it also did not help much.   Blood pressure (!) 150/80, pulse 100, temperature 97.6 F (36.4 C), temperature source Oral, height 5' 1.5" (1.562 m), weight 134 lb 8 oz (61 kg), SpO2 97  %. Social History /Family History/Past Medical History reviewed in detail and updated in EMR if needed.   Review of Systems  Constitutional: Negative for fatigue and fever.  HENT: Negative for congestion.   Eyes: Negative for pain.  Respiratory: Positive for cough and shortness of breath.   Cardiovascular: Negative for chest pain, palpitations and leg swelling.  Gastrointestinal: Negative for abdominal pain.  Genitourinary: Negative for dysuria and vaginal bleeding.  Musculoskeletal: Negative for back pain.  Neurological: Negative for syncope, light-headedness and headaches.  Psychiatric/Behavioral: Negative for dysphoric mood.       Objective:   Physical Exam  Constitutional: Vital signs are normal. She appears well-developed and well-nourished. She is cooperative.  Non-toxic appearance. She does not appear ill. No distress.  HENT:  Head: Normocephalic.  Right Ear: Hearing, tympanic membrane, external ear and ear canal normal. Tympanic membrane is not erythematous, not retracted and not bulging.  Left Ear: Hearing, tympanic membrane, external ear and ear canal normal. Tympanic membrane is not erythematous, not retracted and not bulging.  Nose: No mucosal edema or rhinorrhea. Right sinus exhibits no maxillary sinus tenderness and no frontal sinus tenderness. Left sinus exhibits no maxillary sinus tenderness and no frontal sinus tenderness.  Mouth/Throat: Uvula is midline, oropharynx is clear and moist and mucous membranes are normal.  Eyes: Pupils are equal, round, and reactive to light. Conjunctivae, EOM and lids are normal. Lids are everted and swept, no foreign bodies  found.  Neck: Trachea normal and normal range of motion. Neck supple. Carotid bruit is not present. No thyroid mass and no thyromegaly present.  Cardiovascular: Normal rate, regular rhythm, S1 normal, S2 normal, normal heart sounds, intact distal pulses and normal pulses. Exam reveals no gallop and no friction rub.  No  murmur heard. Pulmonary/Chest: Effort normal. No tachypnea. No respiratory distress. She has decreased breath sounds. She has wheezes. She has no rhonchi. She has no rales.  Abdominal: Soft. Normal appearance and bowel sounds are normal. There is no tenderness.  Neurological: She is alert.  Skin: Skin is warm, dry and intact. No rash noted.  Psychiatric: Her speech is normal and behavior is normal. Judgment and thought content normal. Her mood appears not anxious. Cognition and memory are normal. She does not exhibit a depressed mood.     Diabetic foot exam: Normal inspection No skin breakdown No calluses  Normal DP pulses Normal sensation to light touch and monofilament Nails thickened      Assessment & Plan:

## 2018-08-18 NOTE — Telephone Encounter (Signed)
appt has already been changed to 08/18/18 with Dr Diona Browner. Nothing further needed.

## 2018-08-19 ENCOUNTER — Telehealth: Payer: Self-pay | Admitting: *Deleted

## 2018-08-19 ENCOUNTER — Other Ambulatory Visit: Payer: Self-pay | Admitting: Family Medicine

## 2018-08-19 LAB — FECAL OCCULT BLOOD, IMMUNOCHEMICAL: IFOBT: NEGATIVE

## 2018-08-19 NOTE — Telephone Encounter (Signed)
Left message for Cindy Burns to return call in regards to lab results.

## 2018-08-19 NOTE — Telephone Encounter (Signed)
-----   Message from [name], MD sent at 08/18/2018  5:19 PM EST ----- Very poor control of cholesterol, sugar higher than expected.  Work on low carb, low chol diet.  Start low dose atorvastatin.. We can increase as tolerated.  Call in atorvastatin 10 mg daily #30 11 RF.  Will recheck in 3 months at next lab draw.

## 2018-08-20 ENCOUNTER — Encounter: Payer: Self-pay | Admitting: *Deleted

## 2018-08-20 MED ORDER — ATORVASTATIN CALCIUM 10 MG PO TABS: 10.0000 mg | ORAL_TABLET | Freq: Every day | ORAL | 3 refills | Status: DC

## 2018-08-20 NOTE — Telephone Encounter (Signed)
Unable to reach patient by phone. Letter with results and recommendations mailed to patient. [facility] [place]  Patient does not have a 3 month appointment scheduled so I ask her to call the office to schedule. (See result note).

## 2018-08-20 NOTE — Telephone Encounter (Signed)
Left message for Cindy Burns to return call in regards to lab results.

## 2018-09-16 ENCOUNTER — Encounter: Payer: Self-pay | Admitting: Family Medicine

## 2018-10-11 ENCOUNTER — Other Ambulatory Visit: Payer: Self-pay | Admitting: Family Medicine

## 2018-10-12 NOTE — Telephone Encounter (Signed)
Last office visit 08/18/2018.  Last refilled 04/24/2018 for #180 with 1 refill.  No future appointments.

## 2018-11-09 ENCOUNTER — Ambulatory Visit: Payer: Self-pay | Admitting: *Deleted

## 2018-11-09 NOTE — Telephone Encounter (Signed)
Pt reports cough, worsening SOB x 2 weeks. States cough productive for clear phlegm. Reports had diabetes clinical trail appt today and "Doctor listened to my lungs and told me I may have pneumonia in my right lung."  Denies fever, CP "Except my ribs hurt from coughing." States has been using nebs and inhalers. Can not lie flat at night. Audible wheezing  during call. Speaking in short sentences, speech halting. Directed pt to ED. States she will go to UC. Made aware she may be sent to ED; reiterated recommendation to go to ED.  Pt verbalizes understanding. Care advise given per protocol.  Reason for Disposition . [1] MODERATE difficulty breathing (e.g., speaks in phrases, SOB even at rest, pulse 100-120) AND [2] NEW-onset or WORSE than normal    With audible wheezing.  Answer Assessment - Initial Assessment Questions 1. RESPIRATORY STATUS: "Describe your breathing?" (e.g., wheezing, shortness of breath, unable to speak, severe coughing)      Wheezing, coughing, SOB 2. ONSET: "When did this breathing problem begin?"      2 weeks ago, worsening gradually 3. PATTERN "Does the difficult breathing come and go, or has it been constant since it started?"      Constant 4. SEVERITY: "How bad is your breathing?" (e.g., mild, moderate, severe)    - MILD: No SOB at rest, mild SOB with walking, speaks normally in sentences, can lay down, no retractions, pulse < 100.    - MODERATE: SOB at rest, SOB with minimal exertion and prefers to sit, cannot lie down flat, speaks in phrases, mild retractions, audible wheezing, pulse 100-120.    - SEVERE: Very SOB at rest, speaks in single words, struggling to breathe, sitting hunched forward, retractions, pulse > 120     Moderate 5. RECURRENT SYMPTOM: "Have you had difficulty breathing before?" If so, ask: "When was the last time?" and "What happened that time?"      Yes. H/O COPD 6. CARDIAC HISTORY: "Do you have any history of heart disease?" (e.g., heart attack,  angina, bypass surgery, angioplasty)      HTN 7. LUNG HISTORY: "Do you have any history of lung disease?"  (e.g., pulmonary embolus, asthma, emphysema)     COPD 8. CAUSE: "What do you think is causing the breathing problem?"      "Told by MD at diabetic clinic trail today I may have pneumonia, right lung." 9. OTHER SYMPTOMS: "Do you have any other symptoms? (e.g., dizziness, runny nose, cough, chest pain, fever)     Cough, runny nose  Protocols used: BREATHING DIFFICULTY-A-AH

## 2018-11-10 ENCOUNTER — Other Ambulatory Visit: Payer: Self-pay | Admitting: Family Medicine

## 2018-11-10 NOTE — Telephone Encounter (Signed)
LM on VM (okay per DPR) to check on status of patient.  I do not see any notes in the EMR where she went to the ER and want to be sure that she has been evaluated for symptoms.  Will forward to Dr. Diona Browner and her CMA Butch Penny to follow up.

## 2018-11-10 NOTE — Telephone Encounter (Signed)
Please call to check on patient. 

## 2018-11-11 NOTE — Telephone Encounter (Signed)
Left message on voicemail asking patient to return my call to give [place] an update on how she is doing since calling triage nurse on Sunday.  No record that patient went to ER as instructed.

## 2018-11-11 NOTE — Telephone Encounter (Signed)
Spoke with Cindy Burns.  She states she went to an Urgent Care over the weekend but the wait was over 2 hours each time she went so she finally gave up.  She did not want to go to the ER because last time she went she was there for 8 hours. She states she has been taking Mucinex every 12 hours.  She did call today and was schedule with Cindy Burns on Friday 11/13/2018.  I was able to move her appointment up to tomorrow at 11:15 am with Cindy Burns.

## 2018-11-12 ENCOUNTER — Ambulatory Visit (INDEPENDENT_AMBULATORY_CARE_PROVIDER_SITE_OTHER): Payer: Medicare Other | Admitting: Internal Medicine

## 2018-11-12 ENCOUNTER — Encounter: Payer: Self-pay | Admitting: Internal Medicine

## 2018-11-12 VITALS — BP 146/70 | HR 100 | Temp 97.9°F | Wt 134.0 lb

## 2018-11-12 DIAGNOSIS — J441 Chronic obstructive pulmonary disease with (acute) exacerbation: Secondary | ICD-10-CM

## 2018-11-12 MED ORDER — IPRATROPIUM-ALBUTEROL 0.5-2.5 (3) MG/3ML IN SOLN
3.0000 mL | Freq: Once | RESPIRATORY_TRACT | Status: AC
  Administered 2018-11-12: 3 mL via RESPIRATORY_TRACT

## 2018-11-12 MED ORDER — PREDNISONE 10 MG PO TABS: ORAL_TABLET | ORAL | 0 refills | Status: DC

## 2018-11-12 MED ORDER — AMOXICILLIN-POT CLAVULANATE 875-125 MG PO TABS: 1.0000 | ORAL_TABLET | Freq: Two times a day (BID) | ORAL | 0 refills | Status: DC

## 2018-11-12 NOTE — Addendum Note (Signed)
Addended by: Lurlean Nanny on: 11/12/2018 11:38 AM   Modules accepted: Orders

## 2018-11-12 NOTE — Progress Notes (Signed)
HPI  Pt presents to the clinic today with c/o runny nose, nasal congestion, cough and chest congestion. This started 1 month ago. She is blowing clear mucous out of her nose. The cough is productive of white/cream mucous. She is short of breath, but this is chronic. She denies ear pain or sore throat. She denies fever, chills and body aches. She has not had sick contacts.  She has used Symbicort and Albuterol with some relief. She has a history of COPD and DM 2. She is UTD on pneumonia vaccines. She does continue to smoke.  Review of Systems      Past Medical History:  Diagnosis Date  . Cancer [facility]) 2005   Breast Cancer  . COPD (chronic obstructive pulmonary disease) ([place])   . Depression   . Diabetes mellitus without complication ([place])   . Hyperlipidemia     Family History  Problem Relation Age of Onset  . Arthritis Mother   . Alzheimer's disease Mother   . Dementia Father   . COPD Brother   . COPD Brother     Social History   Socioeconomic History  . Marital status: Divorced    Spouse name: Not on file  . Number of children: Not on file  . Years of education: Not on file  . Highest education level: Not on file  Occupational History  . Not on file  Social Needs  . Financial resource strain: Not on file  . Food insecurity:    Worry: Not on file    Inability: Not on file  . Transportation needs:    Medical: Not on file    Non-medical: Not on file  Tobacco Use  . Smoking status: Current Every Day Smoker    Packs/day: 0.03    Years: 40.00    Pack years: 1.20    Types: Cigarettes  . Smokeless tobacco: Never Used  Substance and Sexual Activity  . Alcohol use: No  . Drug use: No  . Sexual activity: Never  Lifestyle  . Physical activity:    Days per week: Not on file    Minutes per session: Not on file  . Stress: Not on file  Relationships  . Social connections:    Talks on phone: Not on file    Gets together: Not on file    Attends religious service: Not on  file    Active member of club or organization: Not on file    Attends meetings of clubs or organizations: Not on file    Relationship status: Not on file  . Intimate partner violence:    Fear of current or ex partner: Not on file    Emotionally abused: Not on file    Physically abused: Not on file    Forced sexual activity: Not on file  Other Topics Concern  . Not on file  Social History Narrative   Single, 1 daughter in [place]    on medicare/medicaid, disabled.    No Known Allergies   Constitutional: . Denies headache, fatigue, fever or  abrupt weight changes.  HEENT:  Positive runny nose, nasal congestion. Denies eye redness, eye pain, pressure behind the eyes, facial pain ,ear pain, ringing in the ears, wax buildup or sore throat. Respiratory: Positive cough and shortness of breath. Denies difficulty breathing.  Cardiovascular: Denies chest pain, chest tightness, palpitations or swelling in the hands or feet.   No other specific complaints in a complete review of systems (except as listed in HPI above).  Objective:  BP (!) 146/70   Pulse 100   Temp 97.9 F (36.6 C) (Oral)   Wt 134 lb (60.8 kg)   SpO2 97%   BMI 24.91 kg/m  Wt Readings from Last 3 Encounters:  11/12/18 134 lb (60.8 kg)  08/18/18 134 lb 8 oz (61 kg)  01/06/18 136 lb 12 oz (62 kg)     General: Appears her stated age, chronically ill appearing, in NAD. HEENT: Head: normal shape and size, maxillary sinus tenderness noted; Right Ear: cerumen impaction; Left Ear: Tm's gray and intact, normal light reflex; Nose: mucosa pink and moist, septum midline; Throat/Mouth:Teeth present, mucosa erythematous and moist, no exudate noted, no lesions or ulcerations noted.  Neck: No cervical lymphadenopathy.  Cardiovascular: Normal rate and rhythm.   Pulmonary/Chest: Normal effort and positive vesicular breath sounds with scattered rhonchi throughout. No respiratory distress. No wheezes, rales noted.       Assessment  & Plan:   COPD Exacerbation:  Get some rest and drink plenty of water Duoneb in office RX for Augmentin BID x 7 days RX for Pred  Taper x 6 days Continue Symbicort and Albuterol as needed  RTC as needed or if symptoms persist.   Webb Silversmith, NP '

## 2018-11-12 NOTE — Patient Instructions (Signed)

## 2018-11-13 ENCOUNTER — Ambulatory Visit: Payer: Medicare Other | Admitting: Primary Care

## 2018-11-16 ENCOUNTER — Telehealth: Payer: Self-pay | Admitting: Family Medicine

## 2018-11-16 NOTE — Telephone Encounter (Signed)
Left message asking pt to call office  [name], CMA  Ramond Craver B        Please schedule Diabetes follow up with fasting labs prior with Dr. Diona Browner   Thanks  Destiny

## 2018-11-24 NOTE — Telephone Encounter (Signed)
Left message asking pt to call office  °

## 2018-11-30 ENCOUNTER — Encounter: Payer: Self-pay | Admitting: Family Medicine

## 2018-11-30 NOTE — Telephone Encounter (Signed)
Left message asking pt to call office also mailed letter

## 2018-12-10 ENCOUNTER — Telehealth: Payer: Self-pay | Admitting: Family Medicine

## 2018-12-10 DIAGNOSIS — E0842 Diabetes mellitus due to underlying condition with diabetic polyneuropathy: Secondary | ICD-10-CM

## 2018-12-10 DIAGNOSIS — E782 Mixed hyperlipidemia: Secondary | ICD-10-CM

## 2018-12-10 DIAGNOSIS — D649 Anemia, unspecified: Secondary | ICD-10-CM

## 2018-12-10 NOTE — Telephone Encounter (Signed)
-----   Message from Ellamae Sia sent at 12/07/2018  3:52 PM EST ----- Regarding: Lab orders for Friday, 3.6.20 Lab orders for a f/u appt

## 2018-12-11 ENCOUNTER — Ambulatory Visit (INDEPENDENT_AMBULATORY_CARE_PROVIDER_SITE_OTHER): Payer: Medicare Other | Admitting: Family Medicine

## 2018-12-11 ENCOUNTER — Other Ambulatory Visit (INDEPENDENT_AMBULATORY_CARE_PROVIDER_SITE_OTHER): Payer: Medicare Other

## 2018-12-11 VITALS — BP 150/70 | HR 82 | Temp 98.3°F | Ht 61.5 in | Wt 132.8 lb

## 2018-12-11 DIAGNOSIS — I152 Hypertension secondary to endocrine disorders: Secondary | ICD-10-CM

## 2018-12-11 DIAGNOSIS — E785 Hyperlipidemia, unspecified: Secondary | ICD-10-CM

## 2018-12-11 DIAGNOSIS — E1143 Type 2 diabetes mellitus with diabetic autonomic (poly)neuropathy: Secondary | ICD-10-CM

## 2018-12-11 DIAGNOSIS — E1169 Type 2 diabetes mellitus with other specified complication: Secondary | ICD-10-CM | POA: Diagnosis not present

## 2018-12-11 DIAGNOSIS — E0842 Diabetes mellitus due to underlying condition with diabetic polyneuropathy: Secondary | ICD-10-CM

## 2018-12-11 DIAGNOSIS — E782 Mixed hyperlipidemia: Secondary | ICD-10-CM

## 2018-12-11 DIAGNOSIS — E1159 Type 2 diabetes mellitus with other circulatory complications: Secondary | ICD-10-CM | POA: Diagnosis not present

## 2018-12-11 DIAGNOSIS — I10 Essential (primary) hypertension: Secondary | ICD-10-CM | POA: Diagnosis not present

## 2018-12-11 LAB — HEMOGLOBIN A1C: Hgb A1c MFr Bld: 9 % — ABNORMAL HIGH (ref 4.6–6.5)

## 2018-12-11 LAB — LIPID PANEL
Cholesterol: 225 mg/dL — ABNORMAL HIGH (ref 0–200)
HDL: 44.1 mg/dL (ref >39.00)
NonHDL: 181.16
Total CHOL/HDL Ratio: 5
Triglycerides: 296 mg/dL — ABNORMAL HIGH (ref 0.0–149.0)
VLDL: 59.2 mg/dL — ABNORMAL HIGH (ref 0.0–40.0)

## 2018-12-11 LAB — COMPREHENSIVE METABOLIC PANEL
ALT: 13 U/L (ref 0–35)
AST: 16 U/L (ref 0–37)
Albumin: 4.4 g/dL (ref 3.5–5.2)
Alkaline Phosphatase: 43 U/L (ref 39–117)
BUN: 12 mg/dL (ref 6–23)
CO2: 25 mEq/L (ref 19–32)
Calcium: 9.3 mg/dL (ref 8.4–10.5)
Chloride: 93 mEq/L — ABNORMAL LOW (ref 96–112)
Creatinine, Ser: 0.84 mg/dL (ref 0.40–1.20)
GFR: 66.96 mL/min (ref >60.00)
Glucose, Bld: 229 mg/dL — ABNORMAL HIGH (ref 70–99)
Potassium: 3.9 mEq/L (ref 3.5–5.1)
Sodium: 127 mEq/L — ABNORMAL LOW (ref 135–145)
Total Bilirubin: 0.4 mg/dL (ref 0.2–1.2)
Total Protein: 7.3 g/dL (ref 6.0–8.3)

## 2018-12-11 LAB — HM DIABETES FOOT EXAM

## 2018-12-11 LAB — LDL CHOLESTEROL, DIRECT: Direct LDL: 163 mg/dL

## 2018-12-11 MED ORDER — VARENICLINE TARTRATE 1 MG PO TABS: 1.0000 mg | ORAL_TABLET | Freq: Two times a day (BID) | ORAL | 3 refills | Status: DC

## 2018-12-11 MED ORDER — VARENICLINE TARTRATE 0.5 MG X 11 & 1 MG X 42 PO MISC: ORAL | 0 refills | Status: DC

## 2018-12-11 NOTE — Assessment & Plan Note (Signed)
Likely poor control given FBS and post prandials above goal.. She reuses additional med at this time.  Cwill work aggressively on diet and start exercise.

## 2018-12-11 NOTE — Patient Instructions (Addendum)
Please stop at the lab to have labs drawn.  Due for yearly eye exam.  Work on low carbohydrate diet.  Stop smoking with chantix.  Follow BP closely over the next month.. if not down < 140 /90 with quitting smoking and diet changes/exercsie.. call to increase losartan

## 2018-12-11 NOTE — Assessment & Plan Note (Signed)
Stable

## 2018-12-11 NOTE — Assessment & Plan Note (Signed)
Inadequate control at home and in office. Pt wishes to quit smoking and try lifestyle change before increase in losartan. If not improving will increase dose.

## 2018-12-11 NOTE — Assessment & Plan Note (Signed)
Due for re-eval. 

## 2018-12-11 NOTE — Progress Notes (Signed)
Subjective:    Patient ID: Cindy Burns, female    DOB: Feb 09, 1946, 73 y.o.   MRN: [id number]  HPI   73 year old female presents for follow up DM  Diabetes:   Due for lab re-eval.... on [place]   She has not been any exercise  And ha not been watching her diet. Lab Results  Component Value Date   HGBA1C 7.6 (A) 08/18/2018  Using medications without difficulties: Hypoglycemic episodes:none Hyperglycemic episodes: 289 after meals Feet problems: no ulcers Blood Sugars averaging: FBS 189 eye exam within last year:  Hypertension:   Inadequate   Control at home and here in office on losartan.. taking her medication regularly. BP Readings from Last 3 Encounters:  12/11/18 (!) 150/70  11/12/18 (!) 146/70  08/18/18 (!) 150/80  Using medication without problems or lightheadedness: none Chest pain with exertion:none Edema:none Short of breath:moderate.. has daily cough  But some better on on symbicort and albuterol prn. Average home BPs: above goal. Other issues:   Wants to restart Chantix for smoking cessation.  Due for re-eval of cholesterol as well.  Social History /Family History/Past Medical History reviewed in detail and updated in EMR if needed. Blood pressure (!) 150/70, pulse 82, temperature 98.3 F (36.8 C), temperature source Oral, height 5' 1.5" (1.562 m), weight 132 lb 12 oz (60.2 kg), SpO2 95 %.  Review of Systems  Constitutional: Negative for fatigue and fever.  HENT: Negative for congestion.   Eyes: Negative for pain.  Respiratory: Positive for cough and shortness of breath.   Cardiovascular: Negative for chest pain, palpitations and leg swelling.  Gastrointestinal: Negative for abdominal pain.  Genitourinary: Negative for dysuria and vaginal bleeding.  Musculoskeletal: Negative for back pain.  Neurological: Negative for syncope, light-headedness and headaches.  Psychiatric/Behavioral: Negative for dysphoric mood.       Objective:   Physical  Exam Constitutional:      General: She is not in acute distress.    Appearance: Normal appearance. She is well-developed. She is not ill-appearing or toxic-appearing.  HENT:     Head: Normocephalic.     Right Ear: Hearing, tympanic membrane, ear canal and external ear normal. Tympanic membrane is not erythematous, retracted or bulging.     Left Ear: Hearing, tympanic membrane, ear canal and external ear normal. Tympanic membrane is not erythematous, retracted or bulging.     Nose: No mucosal edema or rhinorrhea.     Right Sinus: No maxillary sinus tenderness or frontal sinus tenderness.     Left Sinus: No maxillary sinus tenderness or frontal sinus tenderness.     Mouth/Throat:     Pharynx: Uvula midline.  Eyes:     General: Lids are normal. Lids are everted, no foreign bodies appreciated.     Conjunctiva/sclera: Conjunctivae normal.     Pupils: Pupils are equal, round, and reactive to light.  Neck:     Musculoskeletal: Normal range of motion and neck supple.     Thyroid: No thyroid mass or thyromegaly.     Vascular: No carotid bruit.     Trachea: Trachea normal.  Cardiovascular:     Rate and Rhythm: Normal rate and regular rhythm.     Pulses: Normal pulses.     Heart sounds: Normal heart sounds, S1 normal and S2 normal. No murmur. No friction rub. No gallop.   Pulmonary:     Effort: Pulmonary effort is normal. No tachypnea or respiratory distress.     Breath sounds: Normal breath sounds. No  wheezing, rhonchi or rales.  Abdominal:     General: Bowel sounds are normal.     Palpations: Abdomen is soft.     Tenderness: There is no abdominal tenderness.  Skin:    General: Skin is warm and dry.     Findings: No rash.  Neurological:     Mental Status: She is alert.  Psychiatric:        Mood and Affect: Mood is not anxious or depressed.        Speech: Speech normal.        Behavior: Behavior normal. Behavior is cooperative.        Thought Content: Thought content normal.         Judgment: Judgment normal.     Diabetic foot exam: Normal inspection No skin breakdown No calluses  Normal DP pulses Normal sensation to light touch and monofilament Nails thick and yellow.[name] offered podiatry referral .. pt will consider       Assessment & Plan:

## 2018-12-14 ENCOUNTER — Telehealth: Payer: Self-pay | Admitting: Family Medicine

## 2018-12-14 ENCOUNTER — Telehealth: Payer: Self-pay | Admitting: *Deleted

## 2018-12-14 NOTE — Telephone Encounter (Signed)
Best number [phone] Fax # [phone]  Kieth Brightly @ [employer] Please refax paperwork that you sent on Friday.  Kieth Brightly stated the only received page 1 of 13

## 2018-12-14 NOTE — Telephone Encounter (Signed)
Left message for Cindy Burns to return my call in regards to her lab results.

## 2018-12-14 NOTE — Telephone Encounter (Signed)
Office note and labs re-faxed to [facility] at [phone].

## 2018-12-14 NOTE — Telephone Encounter (Signed)
-----   Message from [name], MD sent at 12/12/2018 12:31 PM EST ----- Let pt know that diabetes and cholesterol is very poorly controlled.  I recommend increasing the atorvastatin for cholesterol... let me know if agreeable Also in past she was on metformin for DM.[name] why stopped? SE? Renal function is nml and not a contraindication.  If she does not tolerate metformin I would recommend   Insulin or a med change. Let me know. Make sure follow up schedule in 3 months with fasting labs prior

## 2018-12-15 NOTE — Telephone Encounter (Signed)
Left message for Cindy Burns to return my call in regards to her lab results.

## 2018-12-16 NOTE — Telephone Encounter (Signed)
Left detailed message on voicemail in regards to Cindy Burns's  lab results and Dr. Rometta Emery recommendations.  I ask that she call us back to let up know if she is agreeable to increasing the dose of her atorvastatin.  I also ask that she let us know why the Metformin for her diabetes was stopped.  If side effects,  then I ask that she let us know if she is agreeable to starting insulin or a different diabetic medication.  I ask that she also schedule a 3 month follow up with fasting labs prior when she calls back.

## 2018-12-16 NOTE — Telephone Encounter (Signed)
Pt returned your call Pt stated you can leave message on home phone number if she doesn't answer

## 2018-12-21 ENCOUNTER — Encounter: Payer: Self-pay | Admitting: *Deleted

## 2018-12-21 NOTE — Telephone Encounter (Signed)
Can we send her a letter?

## 2018-12-21 NOTE — Telephone Encounter (Signed)
Cindy Burns has not returned my call in regards to detailed message I left.  Please advise what you would like me to do next?

## 2018-12-21 NOTE — Telephone Encounter (Signed)
Letter mailed as instructed by Dr. Diona Browner.

## 2018-12-23 ENCOUNTER — Other Ambulatory Visit: Payer: Self-pay | Admitting: *Deleted

## 2018-12-23 MED ORDER — GLUCOSE BLOOD VI STRP: ORAL_STRIP | 3 refills | Status: DC

## 2019-01-13 ENCOUNTER — Encounter: Payer: Self-pay | Admitting: *Deleted

## 2019-01-13 NOTE — Telephone Encounter (Signed)
This encounter was created in error - please disregard.

## 2019-01-19 ENCOUNTER — Other Ambulatory Visit: Payer: Self-pay | Admitting: Family Medicine

## 2019-02-10 ENCOUNTER — Telehealth: Payer: Self-pay | Admitting: Family Medicine

## 2019-02-10 NOTE — Telephone Encounter (Signed)
Left message asking pt to call office please schedule DOXY ME dm follow with labs prior to 6/6

## 2019-02-22 ENCOUNTER — Other Ambulatory Visit: Payer: Self-pay | Admitting: Family Medicine

## 2019-03-18 ENCOUNTER — Encounter: Payer: Self-pay | Admitting: Family Medicine

## 2019-03-18 ENCOUNTER — Ambulatory Visit (INDEPENDENT_AMBULATORY_CARE_PROVIDER_SITE_OTHER): Payer: Medicare Other | Admitting: Family Medicine

## 2019-03-18 VITALS — BP 113/60 | Ht 61.5 in | Wt 126.0 lb

## 2019-03-18 DIAGNOSIS — I10 Essential (primary) hypertension: Secondary | ICD-10-CM | POA: Diagnosis not present

## 2019-03-18 DIAGNOSIS — E1143 Type 2 diabetes mellitus with diabetic autonomic (poly)neuropathy: Secondary | ICD-10-CM | POA: Diagnosis not present

## 2019-03-18 DIAGNOSIS — R2689 Other abnormalities of gait and mobility: Secondary | ICD-10-CM | POA: Diagnosis not present

## 2019-03-18 DIAGNOSIS — E0842 Diabetes mellitus due to underlying condition with diabetic polyneuropathy: Secondary | ICD-10-CM

## 2019-03-18 NOTE — Assessment & Plan Note (Signed)
Due for re-eval. NO clear low cbgs.

## 2019-03-18 NOTE — Assessment & Plan Note (Signed)
With weight loss and low salt diet.. BP is better controlled. Not clearly causing her balance issues.  She will follow at home. If remaining low we can consider decreasing BP med at 2 week follow up appt.

## 2019-03-18 NOTE — Addendum Note (Signed)
Addended by: Ellamae Sia on: 03/18/2019 02:00 PM   Modules accepted: Orders

## 2019-03-18 NOTE — Patient Instructions (Addendum)
Patient will call to set up fasting labs now.. curbside.  Also set up follow up in 2 weeks.   Get correct arm cuff.. follow BP at home, in next 2 week. Call if BP running < 90/60.

## 2019-03-18 NOTE — Assessment & Plan Note (Signed)
Unclear cause. Not clearly due to low BP or vertigo.  Eval with labs.  May be due to peripheral neuraopathy form DM...  Follow up appt in office for physical exam/neuro eval.

## 2019-03-18 NOTE — Assessment & Plan Note (Signed)
This may be contributing to balance issues.

## 2019-03-18 NOTE — Progress Notes (Signed)
AVS mailed to patient as instructed by Dr. Bedsole. 

## 2019-03-18 NOTE — Progress Notes (Signed)
VIRTUAL VISIT Due to national recommendations of social distancing due to [place] 19, a virtual visit is felt to be most appropriate for this patient at this time.   I connected with the patient on 03/18/19 at 10:20 AM EDT by virtual telehealth platform and verified that I am speaking with the correct person using two identifiers.   Interactive audio and video telecommunications were attempted between this provider and patient, however failed, due to patient having technical difficulties OR patient did not have access to video capability.  We continued and completed visit with audio only.   . I discussed the limitations, risks, security and privacy concerns of performing an evaluation and management service by  virtual telehealth platform and the availability of in person appointments. I also discussed with the patient that there may be a patient responsible charge related to this service. The patient expressed understanding and agreed to proceed.  Patient location: Home Provider Location: [facility] Participants: Eliezer Lofts and [name]   Chief Complaint  Patient presents with  . Fatigue  . Memory Issue    History of Present Illness: 73 year old female presents for  Re-eval of BP meds.  She report she has been more unstable on her feet in last few months. She is not lightheaded or dizzy. Just feel balance is off. When she stands up from bed she feel unstable but denies vertigo. No headache, no numbness or weakness. Gets tired easily.. hard to mow her whole yard at once, rest in between.  No head injury.  No CP, no SOB.  Hypertension:    On losartan 25 mg daily.  Clinical trial nurse BP Readings from Last 3 Encounters:  03/18/19 113/60  12/11/18 (!) 150/70  11/12/18 (!) 146/70  Using medication without problems or lightheadedness: none Chest pain with exertion:none Edema:none Short of breath: none Average home BPs: hasn't been checking Other issues:  She has  been using less salt lately Wt down 126 ... Wt Readings from Last 3 Encounters:  03/18/19 126 lb (57.2 kg)  12/11/18 132 lb 12 oz (60.2 kg)  11/12/18 134 lb (60.8 kg)    FBS 159-185  COVID 19 screen No recent travel or known exposure to COVID19 The patient denies respiratory symptoms of COVID 19 at this time.  The importance of social distancing was discussed today.   ROS    Past Medical History:  Diagnosis Date  . Cancer [facility]) 2005   Breast Cancer  . COPD (chronic obstructive pulmonary disease) ([place])   . Depression   . Diabetes mellitus without complication ([place])   . Hyperlipidemia     reports that she has been smoking cigarettes. She has a 1.20 pack-year smoking history. She has never used smokeless tobacco. She reports that she does not drink alcohol or use drugs.   Current Outpatient Medications:  .  albuterol (PROVENTIL HFA;VENTOLIN HFA) 108 (90 Base) MCG/ACT inhaler, Inhale 2 puffs into the lungs every 6 (six) hours as needed for wheezing or shortness of breath., Disp: 1 Inhaler, Rfl: 2 .  atorvastatin (LIPITOR) 10 MG tablet, Take 1 tablet (10 mg total) by mouth daily., Disp: 90 tablet, Rfl: 3 .  Blood Glucose Monitoring Suppl (ACCU-CHEK NANO SMARTVIEW) w/Device KIT, Use to check blood sugar daily.  E11.9, Disp: 1 kit, Rfl: 0 .  ezetimibe (ZETIA) 10 MG tablet, Take 10 mg by mouth daily. , Disp: , Rfl:  .  gabapentin (NEURONTIN) 400 MG capsule, TAKE TWO CAPSULES BY MOUTH AT BEDTIME,  Disp: 180 capsule, Rfl: 1 .  glipiZIDE (GLUCOTROL XL) 10 MG 24 hr tablet, TAKE ONE TABLET BY MOUTH EACH MORNING, Disp: 90 tablet, Rfl: 1 .  glucose blood (ACCU-CHEK SMARTVIEW) test strip, Use to check blood sugar daily. E11.9, Disp: 100 each, Rfl: 3 .  JANUVIA 100 MG tablet, TAKE 1 TABLET BY MOUTH DAILY, Disp: 90 tablet, Rfl: 3 .  Lancets (ONETOUCH ULTRASOFT) lancets, Use to test blood sugar once daily. DX: E11.9, Disp: 100 each, Rfl: 3 .  losartan (COZAAR) 25 MG tablet, TAKE 1 TABLET BY MOUTH  EVERY DAY, Disp: 90 tablet, Rfl: 1 .  SYMBICORT 160-4.5 MCG/ACT inhaler, TAKE 2 PUFFS BY MOUTH TWICE A DAY, Disp: 30.6 Inhaler, Rfl: 1 .  venlafaxine XR (EFFEXOR-XR) 75 MG 24 hr capsule, TAKE 1 CAPSULE BY MOUTH EVERY DAY, Disp: 90 capsule, Rfl: 1   Observations/Objective: Blood pressure 113/60, height 5' 1.5" (1.562 m), weight 126 lb (57.2 kg).  Physical Exam  Physical Exam Constitutional:      General: The patient is not in acute distress. Pulmonary:     Effort: Pulmonary effort is normal. No respiratory distress.  Neurological:     Mental Status: The patient is alert and oriented to person, place, and time.  Psychiatric:        Mood and Affect: Mood normal.        Behavior: Behavior normal.   Assessment and Plan Hypertension With weight loss and low salt diet.. BP is better controlled. Not clearly causing her balance issues.  She will follow at home. If remaining low we can consider decreasing BP med at 2 week follow up appt.  Balance problem Unclear cause. Not clearly due to low BP or vertigo.  Eval with labs.  May be due to peripheral neuraopathy form DM...  Follow up appt in office for physical exam/neuro eval.  Diabetes mellitus due to underlying condition, controlled, with neurologic complication ([place]) Due for re-eval. NO clear low cbgs.  Diabetic autonomic neuropathy This may be contributing to balance issues.    I discussed the assessment and treatment plan with the patient. The patient was provided an opportunity to ask questions and all were answered. The patient agreed with the plan and demonstrated an understanding of the instructions.   The patient was advised to call back or seek an in-person evaluation if the symptoms worsen or if the condition fails to improve as anticipated.  Total visit time 15 minutes, > 50% spent counseling and cordinating patients care.     Eliezer Lofts, MD

## 2019-04-01 ENCOUNTER — Other Ambulatory Visit (INDEPENDENT_AMBULATORY_CARE_PROVIDER_SITE_OTHER): Payer: Medicare Other

## 2019-04-01 DIAGNOSIS — I10 Essential (primary) hypertension: Secondary | ICD-10-CM | POA: Diagnosis not present

## 2019-04-01 DIAGNOSIS — R2689 Other abnormalities of gait and mobility: Secondary | ICD-10-CM

## 2019-04-01 LAB — CBC WITH DIFFERENTIAL/PLATELET
Basophils Absolute: 0.1 10*3/uL (ref 0.0–0.1)
Basophils Relative: 0.7 % (ref 0.0–3.0)
Eosinophils Absolute: 0.1 10*3/uL (ref 0.0–0.7)
Eosinophils Relative: 1.4 % (ref 0.0–5.0)
HCT: 40.1 % (ref 36.0–46.0)
Hemoglobin: 13.6 g/dL (ref 12.0–15.0)
Lymphocytes Relative: 17.3 % (ref 12.0–46.0)
Lymphs Abs: 1.5 10*3/uL (ref 0.7–4.0)
MCHC: 34 g/dL (ref 30.0–36.0)
MCV: 92.5 fl (ref 78.0–100.0)
Monocytes Absolute: 0.4 10*3/uL (ref 0.1–1.0)
Monocytes Relative: 4.5 % (ref 3.0–12.0)
Neutro Abs: 6.8 10*3/uL (ref 1.4–7.7)
Neutrophils Relative %: 76.1 % (ref 43.0–77.0)
Platelets: 266 10*3/uL (ref 150.0–400.0)
RBC: 4.33 Mil/uL (ref 3.87–5.11)
RDW: 13.9 % (ref 11.5–15.5)
WBC: 8.9 10*3/uL (ref 4.0–10.5)

## 2019-04-01 LAB — COMPREHENSIVE METABOLIC PANEL
ALT: 10 U/L (ref 0–35)
AST: 13 U/L (ref 0–37)
Albumin: 4.2 g/dL (ref 3.5–5.2)
Alkaline Phosphatase: 36 U/L — ABNORMAL LOW (ref 39–117)
BUN: 11 mg/dL (ref 6–23)
CO2: 21 mEq/L (ref 19–32)
Calcium: 9.1 mg/dL (ref 8.4–10.5)
Chloride: 98 mEq/L (ref 96–112)
Creatinine, Ser: 0.83 mg/dL (ref 0.40–1.20)
GFR: 67.84 mL/min (ref >60.00)
Glucose, Bld: 139 mg/dL — ABNORMAL HIGH (ref 70–99)
Potassium: 4.8 mEq/L (ref 3.5–5.1)
Sodium: 130 mEq/L — ABNORMAL LOW (ref 135–145)
Total Bilirubin: 0.4 mg/dL (ref 0.2–1.2)
Total Protein: 6.7 g/dL (ref 6.0–8.3)

## 2019-04-01 LAB — T3, FREE: T3, Free: 2.6 pg/mL (ref 2.3–4.2)

## 2019-04-01 LAB — HEMOGLOBIN A1C: Hgb A1c MFr Bld: 6.9 % — ABNORMAL HIGH (ref 4.6–6.5)

## 2019-04-01 LAB — LIPID PANEL
Cholesterol: 203 mg/dL — ABNORMAL HIGH (ref 0–200)
HDL: 40.9 mg/dL (ref >39.00)
LDL Cholesterol: 122 mg/dL — ABNORMAL HIGH (ref 0–99)
NonHDL: 161.6
Total CHOL/HDL Ratio: 5
Triglycerides: 200 mg/dL — ABNORMAL HIGH (ref 0.0–149.0)
VLDL: 40 mg/dL (ref 0.0–40.0)

## 2019-04-01 LAB — TSH: TSH: 2.94 u[IU]/mL (ref 0.35–4.50)

## 2019-04-01 LAB — T4, FREE: Free T4: 0.79 ng/dL (ref 0.60–1.60)

## 2019-04-01 LAB — VITAMIN B12: Vitamin B-12: 139 pg/mL — ABNORMAL LOW (ref 211–911)

## 2019-04-06 ENCOUNTER — Other Ambulatory Visit: Payer: Self-pay

## 2019-04-06 ENCOUNTER — Ambulatory Visit (INDEPENDENT_AMBULATORY_CARE_PROVIDER_SITE_OTHER): Payer: Medicare Other | Admitting: Family Medicine

## 2019-04-06 ENCOUNTER — Encounter: Payer: Self-pay | Admitting: Family Medicine

## 2019-04-06 VITALS — BP 130/60 | HR 88 | Temp 98.0°F | Ht 61.5 in | Wt 126.2 lb

## 2019-04-06 DIAGNOSIS — I10 Essential (primary) hypertension: Secondary | ICD-10-CM | POA: Diagnosis not present

## 2019-04-06 DIAGNOSIS — R2689 Other abnormalities of gait and mobility: Secondary | ICD-10-CM

## 2019-04-06 DIAGNOSIS — E0842 Diabetes mellitus due to underlying condition with diabetic polyneuropathy: Secondary | ICD-10-CM | POA: Diagnosis not present

## 2019-04-06 DIAGNOSIS — E222 Syndrome of inappropriate secretion of antidiuretic hormone: Secondary | ICD-10-CM | POA: Insufficient documentation

## 2019-04-06 DIAGNOSIS — E871 Hypo-osmolality and hyponatremia: Secondary | ICD-10-CM | POA: Diagnosis not present

## 2019-04-06 DIAGNOSIS — E538 Deficiency of other specified B group vitamins: Secondary | ICD-10-CM

## 2019-04-06 LAB — HM DIABETES FOOT EXAM

## 2019-04-06 MED ORDER — CYANOCOBALAMIN 1000 MCG/ML IJ SOLN
1000.0000 ug | Freq: Once | INTRAMUSCULAR | Status: AC
  Administered 2019-04-06: 1000 ug via INTRAMUSCULAR

## 2019-04-06 MED ORDER — ALBUTEROL SULFATE HFA 108 (90 BASE) MCG/ACT IN AERS: 2.0000 | INHALATION_SPRAY | Freq: Four times a day (QID) | RESPIRATORY_TRACT | 2 refills | Status: DC | PRN

## 2019-04-06 MED ORDER — ACCU-CHEK NANO SMARTVIEW W/DEVICE KIT: PACK | 0 refills | Status: DC

## 2019-04-06 MED ORDER — ACCU-CHEK SMARTVIEW VI STRP: ORAL_STRIP | 3 refills | Status: DC

## 2019-04-06 NOTE — Patient Instructions (Addendum)
Start B12 1000 mg daily OTC.  Do not restrict salt in diet.  Continue losartan 25 mg.  Keep up healthy eating.

## 2019-04-06 NOTE — Assessment & Plan Note (Signed)
Given B12 1000mg  x 1 today.. start oral b12 daily 1000 mg... re-eval in 3 months

## 2019-04-06 NOTE — Assessment & Plan Note (Signed)
Stop salt restirction and re-eval in 3 months.. may be contributing to balance issues.

## 2019-04-06 NOTE — Assessment & Plan Note (Signed)
Well controlled. Continue current medication.  

## 2019-04-06 NOTE — Addendum Note (Signed)
Addended by: [name] on: 04/06/2019 08:49 AM   Modules accepted: Orders

## 2019-04-06 NOTE — Progress Notes (Signed)
Chief Complaint  Patient presents with  . Follow-up    BP    History of Present Illness: HPI  73 year old female presents for  2 week follow up BP.  Hypertension:  Improved control of BP.. on losartan 25 mg daily BP Readings from Last 3 Encounters:  04/06/19 130/60  03/18/19 113/60  12/11/18 (!) 150/70  Using medication without problems or lightheadedness:  See below Chest pain with exertion: none Edema: none Short of breath:none Average home BPs: 110/50-140/70 Other issues:   recent balance issues: She report she has been more unstable on her feet in last few months. She is not lightheaded or dizzy. Just feel balance is off.   she does feel sit may be getting better in last few weeks. When she stands up from bed she feel unstable but denies vertigo. No headache, no numbness or weakness. Gets tired easily.. hard to mow her whole yard at once, rest in between.  No head injury.  recent labs showed DM well controlled, nml cbc, nml thyroid, no anemia... B12 was very low at 139  Sodium also on low side  hse restricts sodium a lot.  No CP, no SOB.    Wt Readings from Last 3 Encounters:  04/06/19 126 lb 4 oz (57.3 kg)  03/18/19 126 lb (57.2 kg)  12/11/18 132 lb 12 oz (60.2 kg)  Body mass index is 23.47 kg/m.    COVID 19 screen No recent travel or known exposure to COVID19 The patient denies respiratory symptoms of COVID 19 at this time.  The importance of social distancing was discussed today.   Review of Systems  Constitutional: Negative for chills and fever.  HENT: Negative for congestion and ear pain.   Eyes: Negative for pain and redness.  Respiratory: Negative for cough and shortness of breath.   Cardiovascular: Negative for chest pain, palpitations and leg swelling.       Has varicose veins.[name] wears compression hose.  Gastrointestinal: Negative for abdominal pain, blood in stool, constipation, diarrhea, nausea and vomiting.  Genitourinary: Negative for dysuria.   Musculoskeletal: Negative for falls and myalgias.  Skin: Negative for rash.  Neurological: Negative for dizziness.  Psychiatric/Behavioral: Negative for depression. The patient is not nervous/anxious.       Past Medical History:  Diagnosis Date  . Cancer [facility]) 2005   Breast Cancer  . COPD (chronic obstructive pulmonary disease) ([place])   . Depression   . Diabetes mellitus without complication ([place])   . Hyperlipidemia     reports that she has been smoking cigarettes. She has a 1.20 pack-year smoking history. She has never used smokeless tobacco. She reports that she does not drink alcohol or use drugs.   Current Outpatient Medications:  .  albuterol (VENTOLIN HFA) 108 (90 Base) MCG/ACT inhaler, Inhale 2 puffs into the lungs every 6 (six) hours as needed for wheezing or shortness of breath., Disp: 18 g, Rfl: 2 .  atorvastatin (LIPITOR) 10 MG tablet, Take 1 tablet (10 mg total) by mouth daily., Disp: 90 tablet, Rfl: 3 .  Blood Glucose Monitoring Suppl (ACCU-CHEK NANO SMARTVIEW) w/Device KIT, Use to check blood sugar daily.  E11.9, Disp: 1 kit, Rfl: 0 .  ezetimibe (ZETIA) 10 MG tablet, Take 10 mg by mouth daily. , Disp: , Rfl:  .  gabapentin (NEURONTIN) 400 MG capsule, TAKE TWO CAPSULES BY MOUTH AT BEDTIME, Disp: 180 capsule, Rfl: 1 .  glipiZIDE (GLUCOTROL XL) 10 MG 24 hr tablet, TAKE ONE TABLET BY MOUTH EACH  MORNING, Disp: 90 tablet, Rfl: 1 .  glucose blood (ACCU-CHEK SMARTVIEW) test strip, Use to check blood sugar daily. E11.9, Disp: 100 each, Rfl: 3 .  JANUVIA 100 MG tablet, TAKE 1 TABLET BY MOUTH DAILY, Disp: 90 tablet, Rfl: 3 .  Lancets (ONETOUCH ULTRASOFT) lancets, Use to test blood sugar once daily. DX: E11.9, Disp: 100 each, Rfl: 3 .  losartan (COZAAR) 25 MG tablet, TAKE 1 TABLET BY MOUTH EVERY DAY, Disp: 90 tablet, Rfl: 1 .  SYMBICORT 160-4.5 MCG/ACT inhaler, TAKE 2 PUFFS BY MOUTH TWICE A DAY, Disp: 30.6 Inhaler, Rfl: 1 .  venlafaxine XR (EFFEXOR-XR) 75 MG 24 hr capsule, TAKE 1  CAPSULE BY MOUTH EVERY DAY, Disp: 90 capsule, Rfl: 1   Observations/Objective: Blood pressure 130/60, pulse 88, temperature 98 F (36.7 C), temperature source Temporal, height 5' 1.5" (1.562 m), weight 126 lb 4 oz (57.3 kg).  Physical Exam Constitutional:      General: She is not in acute distress.    Appearance: Normal appearance. She is well-developed. She is not ill-appearing or toxic-appearing.  HENT:     Head: Normocephalic.     Right Ear: Hearing, tympanic membrane, ear canal and external ear normal. Tympanic membrane is not erythematous, retracted or bulging.     Left Ear: Hearing, tympanic membrane, ear canal and external ear normal. Tympanic membrane is not erythematous, retracted or bulging.     Nose: No mucosal edema or rhinorrhea.     Right Sinus: No maxillary sinus tenderness or frontal sinus tenderness.     Left Sinus: No maxillary sinus tenderness or frontal sinus tenderness.     Mouth/Throat:     Pharynx: Uvula midline.  Eyes:     General: Lids are normal. Lids are everted, no foreign bodies appreciated.     Conjunctiva/sclera: Conjunctivae normal.     Pupils: Pupils are equal, round, and reactive to light.  Neck:     Musculoskeletal: Normal range of motion and neck supple.     Thyroid: No thyroid mass or thyromegaly.     Vascular: No carotid bruit.     Trachea: Trachea normal.  Cardiovascular:     Rate and Rhythm: Normal rate and regular rhythm.     Pulses: Normal pulses.     Heart sounds: Normal heart sounds, S1 normal and S2 normal. No murmur. No friction rub. No gallop.   Pulmonary:     Effort: Pulmonary effort is normal. No tachypnea or respiratory distress.     Breath sounds: Normal breath sounds. No decreased breath sounds, wheezing, rhonchi or rales.  Abdominal:     General: Bowel sounds are normal.     Palpations: Abdomen is soft.     Tenderness: There is no abdominal tenderness.  Skin:    General: Skin is warm and dry.     Findings: No rash.   Neurological:     Mental Status: She is alert and oriented to person, place, and time.     GCS: GCS eye subscore is 4. GCS verbal subscore is 5. GCS motor subscore is 6.     Cranial Nerves: No cranial nerve deficit.     Sensory: No sensory deficit.     Motor: No abnormal muscle tone.     Coordination: Coordination normal.     Gait: Gait normal.     Deep Tendon Reflexes: Reflexes are normal and symmetric.     Comments: Nml cerebellar exam   No papilledema  Psychiatric:        Mood  and Affect: Mood is not anxious or depressed.        Speech: Speech normal.        Behavior: Behavior normal. Behavior is cooperative.        Thought Content: Thought content normal.        Cognition and Memory: Memory is not impaired. She does not exhibit impaired recent memory or impaired remote memory.        Judgment: Judgment normal.     Diabetic foot exam: Normal inspection No skin breakdown No calluses  Normal DP pulses Normal sensation to light touch and monofilament Nails normal   Assessment and Plan    Hyponatremia  Stop salt restirction and re-eval in 3 months.. may be contributing to balance issues.  B12 deficiency  Given B12 1064m x 1 today.. start oral b12 daily 1000 mg... re-eval in 3 months  Diabetes mellitus due to underlying condition, controlled, with neurologic complication ([place] Good control  With weight loss.  Hypertension Well controlled. Continue current medication.     AEliezer Lofts MD

## 2019-04-06 NOTE — Assessment & Plan Note (Signed)
Good control  With weight loss.

## 2019-04-07 ENCOUNTER — Other Ambulatory Visit: Payer: Self-pay | Admitting: Family Medicine

## 2019-04-07 DIAGNOSIS — E0842 Diabetes mellitus due to underlying condition with diabetic polyneuropathy: Secondary | ICD-10-CM

## 2019-04-07 MED ORDER — ACCU-CHEK AVIVA VI STRP: ORAL_STRIP | 3 refills | Status: DC

## 2019-04-16 ENCOUNTER — Telehealth: Payer: Self-pay | Admitting: *Deleted

## 2019-04-16 NOTE — Telephone Encounter (Signed)
Received lab results from [facility].  Cindy Burns sodium level is low at 125 mmol/L and needs to follow up with Dr. Diona Burns.  I left her a message asking that she call the office to schedule a in office appointment next week with Dr. Diona Burns.

## 2019-04-18 ENCOUNTER — Other Ambulatory Visit: Payer: Self-pay | Admitting: Family Medicine

## 2019-04-19 ENCOUNTER — Telehealth: Payer: Self-pay | Admitting: Family Medicine

## 2019-04-19 NOTE — Telephone Encounter (Signed)
Dr Diona Browner I spoke with pt to schedule a re-eval appointment next per you instructions on paperwork from [facility],  Pt stated she didn't want to come in until her oct appointment

## 2019-04-20 NOTE — Telephone Encounter (Signed)
Noted  

## 2019-04-21 ENCOUNTER — Telehealth: Payer: Self-pay | Admitting: *Deleted

## 2019-04-21 ENCOUNTER — Other Ambulatory Visit: Payer: Self-pay | Admitting: Family Medicine

## 2019-04-21 DIAGNOSIS — E0842 Diabetes mellitus due to underlying condition with diabetic polyneuropathy: Secondary | ICD-10-CM

## 2019-04-21 MED ORDER — ACCU-CHEK AVIVA VI STRP: ORAL_STRIP | 0 refills | Status: DC

## 2019-04-21 NOTE — Telephone Encounter (Signed)
Received faxed refill request for Accuchek aviva test strips. Refill sent to pharmacy as requested.

## 2019-04-22 ENCOUNTER — Other Ambulatory Visit: Payer: Self-pay | Admitting: Family Medicine

## 2019-04-27 ENCOUNTER — Ambulatory Visit: Payer: Medicare Other | Admitting: Family Medicine

## 2019-04-30 ENCOUNTER — Encounter: Payer: Self-pay | Admitting: Family Medicine

## 2019-04-30 ENCOUNTER — Other Ambulatory Visit: Payer: Self-pay

## 2019-04-30 ENCOUNTER — Ambulatory Visit (INDEPENDENT_AMBULATORY_CARE_PROVIDER_SITE_OTHER): Payer: Medicare Other | Admitting: Family Medicine

## 2019-04-30 VITALS — BP 128/70 | HR 81 | Temp 98.2°F | Ht 61.5 in | Wt 125.5 lb

## 2019-04-30 DIAGNOSIS — E782 Mixed hyperlipidemia: Secondary | ICD-10-CM

## 2019-04-30 DIAGNOSIS — E871 Hypo-osmolality and hyponatremia: Secondary | ICD-10-CM | POA: Diagnosis not present

## 2019-04-30 LAB — BASIC METABOLIC PANEL
BUN: 15 mg/dL (ref 6–23)
CO2: 24 mEq/L (ref 19–32)
Calcium: 9.7 mg/dL (ref 8.4–10.5)
Chloride: 96 mEq/L (ref 96–112)
Creatinine, Ser: 0.93 mg/dL (ref 0.40–1.20)
GFR: 59.48 mL/min — ABNORMAL LOW (ref >60.00)
Glucose, Bld: 140 mg/dL — ABNORMAL HIGH (ref 70–99)
Potassium: 4.1 mEq/L (ref 3.5–5.1)
Sodium: 129 mEq/L — ABNORMAL LOW (ref 135–145)

## 2019-04-30 MED ORDER — ROSUVASTATIN CALCIUM 20 MG PO TABS: 20.0000 mg | ORAL_TABLET | Freq: Every day | ORAL | 3 refills | Status: DC

## 2019-04-30 NOTE — Assessment & Plan Note (Signed)
Feel she has muscle wasting with atorvastatin.. wish to change back to crestor... given poor control will change to crestor 20 mg daily. Continue Zetia.

## 2019-04-30 NOTE — Patient Instructions (Addendum)
Add salt to foods.  Limit water.  We will cal with labs results.

## 2019-04-30 NOTE — Assessment & Plan Note (Signed)
Chronic issue.. worse with 03/2019 test. Corrected fro glucose likely 126-127. Pt asymptomatic.  NO ETOH.  ON gabapentin and venlafaxine that could be contributing.  Already restrict wsater and uses salt liberally.   Check Urine Na and osm.  Recheck BMET today.  If remaining low.. will stop venlafaxine.

## 2019-04-30 NOTE — Progress Notes (Signed)
Chief Complaint  Patient presents with  . Follow-up    Labs  . Medication Dose Change    Lipitor discussion    History of Present Illness: HPI  73 year old female presents for evaluation of low sodium.  She has had chronically low sodium for past 3 years 127-136. Mild to moderate  Recent lab with study show moderately low sodium at  125 04/16/2019  Corrected sodium given hyperglycemial likly closer to 126-127 She is not on any diuretic that causes low sodium but  venlafaxine can cause  SIADH, gabapentin has hyponatremia listed as SE.  Lab Results  Component Value Date   HGBA1C 6.9 (H) 04/01/2019   She denies ETOH  She is an smoker  She drink  24-32 ozwater daily. Does restricting    She wishes to change from lipitor to crestor. She feels she felt better on that medication.  CHol not at goal on lipitor 10... will change to crestor 20 (was on 40 mg before) Lab Results  Component Value Date   CHOL 203 (H) 04/01/2019   HDL 40.90 04/01/2019   LDLCALC 122 (H) 04/01/2019   LDLDIRECT 163.0 12/11/2018   TRIG 200.0 (H) 04/01/2019   CHOLHDL 5 04/01/2019      COVID 19 screen No recent travel or known exposure to COVID19 The patient denies respiratory symptoms of COVID 19 at this time.  The importance of social distancing was discussed today.   Review of Systems  Constitutional: Negative for chills and fever.  HENT: Negative for congestion and ear pain.   Eyes: Negative for pain and redness.  Respiratory: Negative for cough and shortness of breath.   Cardiovascular: Negative for chest pain, palpitations and leg swelling.  Gastrointestinal: Negative for abdominal pain, blood in stool, constipation, diarrhea, nausea and vomiting.  Genitourinary: Negative for dysuria.  Musculoskeletal: Negative for falls and myalgias.  Skin: Negative for rash.  Neurological: Negative for dizziness.  Psychiatric/Behavioral: Negative for depression. The patient is not nervous/anxious.        Past Medical History:  Diagnosis Date  . Cancer [facility]) 2005   Breast Cancer  . COPD (chronic obstructive pulmonary disease) ([place])   . Depression   . Diabetes mellitus without complication ([place])   . Hyperlipidemia     reports that she has been smoking cigarettes. She has a 1.20 pack-year smoking history. She has never used smokeless tobacco. She reports that she does not drink alcohol or use drugs.   Current Outpatient Medications:  .  albuterol (VENTOLIN HFA) 108 (90 Base) MCG/ACT inhaler, Inhale 2 puffs into the lungs every 6 (six) hours as needed for wheezing or shortness of breath., Disp: 18 g, Rfl: 2 .  atorvastatin (LIPITOR) 10 MG tablet, Take 1 tablet (10 mg total) by mouth daily., Disp: 90 tablet, Rfl: 3 .  Blood Glucose Monitoring Suppl (ACCU-CHEK AVIVA PLUS) w/Device KIT, Use to check blood sugar daily., Disp: 1 kit, Rfl: 0 .  ezetimibe (ZETIA) 10 MG tablet, Take 10 mg by mouth daily. , Disp: , Rfl:  .  gabapentin (NEURONTIN) 400 MG capsule, TAKE TWO CAPSULES BY MOUTH AT BEDTIME, Disp: 180 capsule, Rfl: 1 .  glipiZIDE (GLUCOTROL XL) 10 MG 24 hr tablet, TAKE 1 TABLET BY MOUTH EVERY MORNING, Disp: 90 tablet, Rfl: 1 .  glucose blood (ACCU-CHEK AVIVA) test strip, Use to check blood sugar daily., Disp: 100 each, Rfl: 0 .  JANUVIA 100 MG tablet, TAKE 1 TABLET BY MOUTH DAILY, Disp: 90 tablet, Rfl: 2 .  Lancets (ONETOUCH ULTRASOFT) lancets, Use to test blood sugar once daily. DX: E11.9, Disp: 100 each, Rfl: 3 .  losartan (COZAAR) 25 MG tablet, TAKE 1 TABLET BY MOUTH EVERY DAY, Disp: 90 tablet, Rfl: 1 .  SYMBICORT 160-4.5 MCG/ACT inhaler, TAKE 2 PUFFS BY MOUTH TWICE A DAY, Disp: 30.6 Inhaler, Rfl: 1 .  venlafaxine XR (EFFEXOR-XR) 75 MG 24 hr capsule, TAKE 1 CAPSULE BY MOUTH EVERY DAY, Disp: 90 capsule, Rfl: 1   Observations/Objective: Blood pressure 128/70, pulse 81, temperature 98.2 F (36.8 C), temperature source Temporal, height 5' 1.5" (1.562 m), weight 125 lb 8 oz (56.9 kg), SpO2 96  %.  Physical Exam Constitutional:      General: She is not in acute distress.    Appearance: Normal appearance. She is well-developed. She is not ill-appearing or toxic-appearing.  HENT:     Head: Normocephalic.     Right Ear: Hearing, tympanic membrane, ear canal and external ear normal. Tympanic membrane is not erythematous, retracted or bulging.     Left Ear: Hearing, tympanic membrane, ear canal and external ear normal. Tympanic membrane is not erythematous, retracted or bulging.     Nose: No mucosal edema or rhinorrhea.     Right Sinus: No maxillary sinus tenderness or frontal sinus tenderness.     Left Sinus: No maxillary sinus tenderness or frontal sinus tenderness.     Mouth/Throat:     Pharynx: Uvula midline.  Eyes:     General: Lids are normal. Lids are everted, no foreign bodies appreciated.     Conjunctiva/sclera: Conjunctivae normal.     Pupils: Pupils are equal, round, and reactive to light.  Neck:     Musculoskeletal: Normal range of motion and neck supple.     Thyroid: No thyroid mass or thyromegaly.     Vascular: No carotid bruit.     Trachea: Trachea normal.  Cardiovascular:     Rate and Rhythm: Normal rate and regular rhythm.     Pulses: Normal pulses.     Heart sounds: Normal heart sounds, S1 normal and S2 normal. No murmur. No friction rub. No gallop.   Pulmonary:     Effort: Pulmonary effort is normal. No tachypnea or respiratory distress.     Breath sounds: Normal breath sounds. No decreased breath sounds, wheezing, rhonchi or rales.  Abdominal:     General: Bowel sounds are normal.     Palpations: Abdomen is soft.     Tenderness: There is no abdominal tenderness.  Skin:    General: Skin is warm and dry.     Findings: No rash.  Neurological:     Mental Status: She is alert.  Psychiatric:        Mood and Affect: Mood is not anxious or depressed.        Speech: Speech normal.        Behavior: Behavior normal. Behavior is cooperative.        Thought  Content: Thought content normal.        Judgment: Judgment normal.      Assessment and Plan   Hyponatremia Chronic issue.. worse with 03/2019 test. Corrected fro glucose likely 126-127. Pt asymptomatic.  NO ETOH.  ON gabapentin and venlafaxine that could be contributing.  Already restrict wsater and uses salt liberally.   Check Urine Na and osm.  Recheck BMET today.  If remaining low.. will stop venlafaxine.  HYPERLIPIDEMIA, MIXED Feel she has muscle wasting with atorvastatin.. wish to change back to crestor... given poor control  will change to crestor 20 mg daily. Continue Zetia.     Eliezer Lofts, MD

## 2019-05-01 ENCOUNTER — Other Ambulatory Visit: Payer: Self-pay | Admitting: Family Medicine

## 2019-05-04 LAB — OSMOLALITY, URINE: Osmolality, Ur: 348 mOsm/kg (ref 50–1200)

## 2019-05-04 LAB — SODIUM, URINE, RANDOM: Sodium, Ur: 69 mmol/L (ref 28–272)

## 2019-05-06 ENCOUNTER — Telehealth: Payer: Self-pay | Admitting: Family Medicine

## 2019-05-06 NOTE — Telephone Encounter (Signed)
Labs from 04/30/2019 & 04/01/2019 faxed to [place] at [phone].

## 2019-05-06 NOTE — Telephone Encounter (Signed)
[facility] called and stated that they received the urinalysis results from our office.  They are requesting all recent lab results be faxed over to them for the patient  Fax- [phone]  Phone- [phone]

## 2019-06-30 ENCOUNTER — Telehealth: Payer: Self-pay | Admitting: Family Medicine

## 2019-06-30 ENCOUNTER — Other Ambulatory Visit: Payer: Self-pay | Admitting: Family Medicine

## 2019-06-30 DIAGNOSIS — E538 Deficiency of other specified B group vitamins: Secondary | ICD-10-CM

## 2019-06-30 DIAGNOSIS — E871 Hypo-osmolality and hyponatremia: Secondary | ICD-10-CM

## 2019-06-30 DIAGNOSIS — E0842 Diabetes mellitus due to underlying condition with diabetic polyneuropathy: Secondary | ICD-10-CM

## 2019-06-30 DIAGNOSIS — D649 Anemia, unspecified: Secondary | ICD-10-CM

## 2019-06-30 NOTE — Telephone Encounter (Signed)
-----   Message from Cloyd Stagers, RT sent at 06/22/2019  1:33 PM EDT ----- Regarding: Lab Orders for Thursday 9.24.2020 Please place lab orders for Thursday 9.24.2020, 3 month DM f/u on Thursday 10.1.2020 Thank you, Dyke Maes RT(R)

## 2019-07-01 ENCOUNTER — Other Ambulatory Visit: Payer: Medicare Other

## 2019-07-08 ENCOUNTER — Ambulatory Visit: Payer: Medicare Other | Admitting: Family Medicine

## 2019-07-29 ENCOUNTER — Other Ambulatory Visit (INDEPENDENT_AMBULATORY_CARE_PROVIDER_SITE_OTHER): Payer: Medicare Other

## 2019-07-29 DIAGNOSIS — E871 Hypo-osmolality and hyponatremia: Secondary | ICD-10-CM | POA: Diagnosis not present

## 2019-07-29 DIAGNOSIS — E538 Deficiency of other specified B group vitamins: Secondary | ICD-10-CM

## 2019-07-29 DIAGNOSIS — E0842 Diabetes mellitus due to underlying condition with diabetic polyneuropathy: Secondary | ICD-10-CM

## 2019-07-29 LAB — COMPREHENSIVE METABOLIC PANEL
ALT: 10 U/L (ref 0–35)
AST: 14 U/L (ref 0–37)
Albumin: 4.2 g/dL (ref 3.5–5.2)
Alkaline Phosphatase: 40 U/L (ref 39–117)
BUN: 11 mg/dL (ref 6–23)
CO2: 26 mEq/L (ref 19–32)
Calcium: 9.2 mg/dL (ref 8.4–10.5)
Chloride: 97 mEq/L (ref 96–112)
Creatinine, Ser: 0.92 mg/dL (ref 0.40–1.20)
GFR: 60.18 mL/min (ref >60.00)
Glucose, Bld: 189 mg/dL — ABNORMAL HIGH (ref 70–99)
Potassium: 4.5 mEq/L (ref 3.5–5.1)
Sodium: 131 mEq/L — ABNORMAL LOW (ref 135–145)
Total Bilirubin: 0.4 mg/dL (ref 0.2–1.2)
Total Protein: 7 g/dL (ref 6.0–8.3)

## 2019-07-29 LAB — LIPID PANEL
Cholesterol: 115 mg/dL (ref 0–200)
HDL: 36.4 mg/dL — ABNORMAL LOW (ref >39.00)
LDL Cholesterol: 50 mg/dL (ref 0–99)
NonHDL: 78.51
Total CHOL/HDL Ratio: 3
Triglycerides: 142 mg/dL (ref 0.0–149.0)
VLDL: 28.4 mg/dL (ref 0.0–40.0)

## 2019-07-29 LAB — HEMOGLOBIN A1C: Hgb A1c MFr Bld: 6.8 % — ABNORMAL HIGH (ref 4.6–6.5)

## 2019-07-29 LAB — VITAMIN B12: Vitamin B-12: 245 pg/mL (ref 211–911)

## 2019-07-30 ENCOUNTER — Encounter: Payer: Self-pay | Admitting: Family Medicine

## 2019-07-30 ENCOUNTER — Other Ambulatory Visit: Payer: Self-pay

## 2019-07-30 ENCOUNTER — Ambulatory Visit (INDEPENDENT_AMBULATORY_CARE_PROVIDER_SITE_OTHER): Payer: Medicare Other | Admitting: Family Medicine

## 2019-07-30 VITALS — BP 148/64 | HR 98 | Temp 98.3°F | Ht 61.5 in | Wt 131.2 lb

## 2019-07-30 DIAGNOSIS — R0989 Other specified symptoms and signs involving the circulatory and respiratory systems: Secondary | ICD-10-CM | POA: Diagnosis not present

## 2019-07-30 DIAGNOSIS — E0842 Diabetes mellitus due to underlying condition with diabetic polyneuropathy: Secondary | ICD-10-CM

## 2019-07-30 DIAGNOSIS — E222 Syndrome of inappropriate secretion of antidiuretic hormone: Secondary | ICD-10-CM | POA: Diagnosis not present

## 2019-07-30 DIAGNOSIS — Z23 Encounter for immunization: Secondary | ICD-10-CM | POA: Diagnosis not present

## 2019-07-30 DIAGNOSIS — I10 Essential (primary) hypertension: Secondary | ICD-10-CM

## 2019-07-30 DIAGNOSIS — F331 Major depressive disorder, recurrent, moderate: Secondary | ICD-10-CM

## 2019-07-30 DIAGNOSIS — R160 Hepatomegaly, not elsewhere classified: Secondary | ICD-10-CM | POA: Insufficient documentation

## 2019-07-30 DIAGNOSIS — E782 Mixed hyperlipidemia: Secondary | ICD-10-CM

## 2019-07-30 LAB — CORTISOL-AM, BLOOD: Cortisol - AM: 15.6 ug/dL

## 2019-07-30 MED ORDER — ALBUTEROL SULFATE HFA 108 (90 BASE) MCG/ACT IN AERS: 2.0000 | INHALATION_SPRAY | Freq: Four times a day (QID) | RESPIRATORY_TRACT | 2 refills | Status: DC | PRN

## 2019-07-30 NOTE — Assessment & Plan Note (Signed)
Well controlled. Continue current medication.  

## 2019-07-30 NOTE — Assessment & Plan Note (Signed)
No ETOH, no family history of liver issues, no tylenol, nml LFTS.   Eval with [place].

## 2019-07-30 NOTE — Assessment & Plan Note (Signed)
High risk with smoking and DM... eval with dopplers.

## 2019-07-30 NOTE — Progress Notes (Signed)
No critical labs need to be addressed urgently. We will discuss labs in detail at upcoming office visit.   

## 2019-07-30 NOTE — Assessment & Plan Note (Signed)
Eval completed. Likely due to venlafaxine. Consider change of med for mood at follow up.  Water restriction.

## 2019-07-30 NOTE — Assessment & Plan Note (Signed)
Poor control per pt. No change with meds today... will follow up in 4 weeks.  Offered counseling.. pt refuses. Consider change at next OV to med not associated with SIADH.[name] wellbutrin ( possibly inefffective in past) vs other. Eval with GAD7 and PHQ9 next OV to determine biggest problem area.

## 2019-07-30 NOTE — Progress Notes (Signed)
Chief Complaint  Patient presents with  . Follow-up    3 month DM F/U    History of Present Illness: HPI  76 year ol d female presents for follow up DM  Diabetes:  Lab Results  Component Value Date   HGBA1C 6.8 (H) 07/29/2019  Using medications without difficulties: Hypoglycemic episodes: Hyperglycemic episodes: Feet problems: Blood Sugars averaging: eye exam within last year:  Elevated Cholesterol:  At goal on zetia and crestor Lab Results  Component Value Date   CHOL 115 07/29/2019   HDL 36.40 (L) 07/29/2019   LDLCALC 50 07/29/2019   LDLDIRECT 163.0 12/11/2018   TRIG 142.0 07/29/2019   CHOLHDL 3 07/29/2019  Using medications without problems: Muscle aches:  Diet compliance: Exercise: Other complaints:  Low sodium, improved some. NML cortisol level and thyroid Possible SIADH... may be due to venlafaxine  She is working on quitting with patches, nicotine.   She feels that MDD/GAD not well controlle don venlafaxine   During study assessment: MD noted  possible heptomegaly.. ( no ETOH, no family history of liver issue,  No Tylenol, nml LFTS.  COVID 19 screen No recent travel or known exposure to COVID19 The patient denies respiratory symptoms of COVID 19 at this time.  The importance of social distancing was discussed today.   ROS    Past Medical History:  Diagnosis Date  . Cancer [facility]) 2005   Breast Cancer  . COPD (chronic obstructive pulmonary disease) ([place])   . Depression   . Diabetes mellitus without complication ([place])   . Hyperlipidemia     reports that she has been smoking cigarettes. She has a 40.00 pack-year smoking history. She has never used smokeless tobacco. She reports that she does not drink alcohol or use drugs.   Current Outpatient Medications:  .  Blood Glucose Monitoring Suppl (ACCU-CHEK AVIVA PLUS) w/Device KIT, Use to check blood sugar daily., Disp: 1 kit, Rfl: 0 .  ezetimibe (ZETIA) 10 MG tablet, Take 10 mg by mouth daily. , Disp:  , Rfl:  .  gabapentin (NEURONTIN) 400 MG capsule, TAKE TWO CAPSULES BY MOUTH AT BEDTIME, Disp: 180 capsule, Rfl: 1 .  glipiZIDE (GLUCOTROL XL) 10 MG 24 hr tablet, TAKE 1 TABLET BY MOUTH EVERY MORNING, Disp: 90 tablet, Rfl: 1 .  glucose blood (ACCU-CHEK AVIVA) test strip, Use to check blood sugar daily., Disp: 100 each, Rfl: 0 .  JANUVIA 100 MG tablet, TAKE 1 TABLET BY MOUTH DAILY, Disp: 90 tablet, Rfl: 2 .  Lancets (ONETOUCH ULTRASOFT) lancets, Use to test blood sugar once daily. DX: E11.9, Disp: 100 each, Rfl: 3 .  losartan (COZAAR) 25 MG tablet, TAKE 1 TABLET BY MOUTH EVERY DAY, Disp: 90 tablet, Rfl: 1 .  rosuvastatin (CRESTOR) 20 MG tablet, Take 1 tablet (20 mg total) by mouth daily., Disp: 90 tablet, Rfl: 3 .  SYMBICORT 160-4.5 MCG/ACT inhaler, TAKE 2 PUFFS BY MOUTH TWICE A DAY, Disp: 30.6 Inhaler, Rfl: 1 .  venlafaxine XR (EFFEXOR-XR) 75 MG 24 hr capsule, TAKE 1 CAPSULE BY MOUTH EVERY DAY, Disp: 90 capsule, Rfl: 1 .  albuterol (VENTOLIN HFA) 108 (90 Base) MCG/ACT inhaler, Inhale 2 puffs into the lungs every 6 (six) hours as needed for wheezing or shortness of breath. (Patient not taking: Reported on 07/30/2019), Disp: 18 g, Rfl: 2   Observations/Objective: Blood pressure (!) 148/64, pulse 98, temperature 98.3 F (36.8 C), height 5' 1.5" (1.562 m), weight 131 lb 4 oz (59.5 kg), SpO2 96 %.  Physical Exam Constitutional:  General: She is not in acute distress.    Appearance: Normal appearance. She is well-developed. She is not ill-appearing or toxic-appearing.  HENT:     Head: Normocephalic.     Right Ear: Hearing, tympanic membrane, ear canal and external ear normal.     Left Ear: Hearing, tympanic membrane, ear canal and external ear normal.     Nose: Nose normal.  Eyes:     General: Lids are normal. Lids are everted, no foreign bodies appreciated.     Conjunctiva/sclera: Conjunctivae normal.     Pupils: Pupils are equal, round, and reactive to light.  Neck:     Musculoskeletal:  Normal range of motion and neck supple.     Thyroid: No thyroid mass or thyromegaly.     Vascular: No carotid bruit.     Trachea: Trachea normal.  Cardiovascular:     Rate and Rhythm: Normal rate and regular rhythm.     Heart sounds: Normal heart sounds, S1 normal and S2 normal. No murmur. No gallop.      Comments: (possible slight right carotid bruit Pulmonary:     Effort: Pulmonary effort is normal. No respiratory distress.     Breath sounds: Normal breath sounds. No wheezing, rhonchi or rales.  Abdominal:     General: Bowel sounds are normal. There is no distension or abdominal bruit.     Palpations: Abdomen is soft. There is no fluid wave or mass.     Tenderness: There is no abdominal tenderness. There is no guarding or rebound.     Hernia: No hernia is present.     Comments: Mild hepatomegaly  Lymphadenopathy:     Cervical: No cervical adenopathy.  Skin:    General: Skin is warm and dry.     Findings: No rash.  Neurological:     Mental Status: She is alert.     Cranial Nerves: No cranial nerve deficit.     Sensory: No sensory deficit.  Psychiatric:        Mood and Affect: Mood is not anxious or depressed.        Speech: Speech normal.        Behavior: Behavior normal. Behavior is cooperative.        Judgment: Judgment normal.      Assessment and Plan   Hepatomegaly No ETOH, no family history of liver issues, no tylenol, nml LFTS.   Eval with [place].  Right carotid bruit High risk with smoking and DM... eval with dopplers.  SIADH (syndrome of inappropriate ADH production) ([place])  Eval completed. Likely due to venlafaxine. Consider change of med for mood at follow up.  Water restriction.  Hypertension Well controlled. Continue current medication.   Diabetes mellitus due to underlying condition, controlled, with neurologic complication ([place]) Well controlled. Continue current medication.   HYPERLIPIDEMIA, MIXED Well controlled. Continue current  medication.   Major depressive disorder, recurrent episode, moderate  Poor control per pt. No change with meds today... will follow up in 4 weeks.  Offered counseling.. pt refuses. Consider change at next OV to med not associated with SIADH.[name] wellbutrin ( possibly inefffective in past) vs other. Eval with GAD7 and PHQ9 next OV to determine biggest problem area.     Eliezer Lofts, MD

## 2019-07-30 NOTE — Patient Instructions (Signed)
We will call you to set up Ultrasounds.

## 2019-08-03 ENCOUNTER — Telehealth: Payer: Self-pay | Admitting: Family Medicine

## 2019-08-03 NOTE — Telephone Encounter (Signed)
Received a call back from patient requesting that I cancel the Abdominal US and the Carotid US that I just got the patient scheduled for yesterday for this Thursday and Friday. She said she was up all night worrying about it and doesn't want to have them done. She appreciates Dr Rometta Emery concern but just doesn't want to have them done. I have canceled these tests per patients request.

## 2019-08-03 NOTE — Telephone Encounter (Signed)
Noted  

## 2019-08-06 ENCOUNTER — Ambulatory Visit: Payer: Medicare Other

## 2019-09-09 ENCOUNTER — Telehealth: Payer: Self-pay | Admitting: *Deleted

## 2019-09-09 NOTE — Telephone Encounter (Signed)
Left message for Ms. Langenberg to return my call.  Received urine results from [facility] that are suggestive of a UTI.  They were unable to do a culture due to inadequate sample.  I have ask patient to call us back and let us know if she is having any symptoms of a UTI.

## 2019-09-10 MED ORDER — SULFAMETHOXAZOLE-TRIMETHOPRIM 800-160 MG PO TABS: 1.0000 | ORAL_TABLET | Freq: Two times a day (BID) | ORAL | 0 refills | Status: DC

## 2019-09-10 NOTE — Telephone Encounter (Signed)
We will go ahead and treat. I will send in 3 day of antibiotics. If symptoms not resolved.. she will need appt with UA/micro/culture.

## 2019-09-10 NOTE — Telephone Encounter (Signed)
Patient called back about message left  She stated that she having to use the restroom more than normal She is not having any burning or pain with urinating.  No odor or cloudy urine.  She stated that she is having a little bit of pain in the stomach area.  And tried more than usual.

## 2019-09-10 NOTE — Telephone Encounter (Signed)
Left message for Cindy Burns that Dr. Diona Browner wants to go ahead and treat her for a UTI.  She has sent in a 3 day course of Bactrim to [facility] in [place].  I advised that if her symptoms are not resolved after completing the antibiotics she will need to be seen in the office to collect another urine sample for UA/Micro/Culture per Dr. Diona Browner.  I ask that she call us back if she has any questions.

## 2019-09-23 ENCOUNTER — Other Ambulatory Visit: Payer: Self-pay | Admitting: Family Medicine

## 2019-10-10 ENCOUNTER — Other Ambulatory Visit: Payer: Self-pay | Admitting: Family Medicine

## 2019-10-21 ENCOUNTER — Other Ambulatory Visit: Payer: Self-pay | Admitting: Family Medicine

## 2019-10-29 ENCOUNTER — Other Ambulatory Visit: Payer: Self-pay | Admitting: Family Medicine

## 2019-11-18 ENCOUNTER — Telehealth: Payer: Self-pay

## 2019-11-18 ENCOUNTER — Ambulatory Visit: Payer: Medicare Other | Admitting: Family Medicine

## 2019-11-18 NOTE — Telephone Encounter (Signed)
Pt said for 3 days has lightheadedness (room does not spin around); pt is constantly thirsty and tired; this AM her FBS was 157.pt usually has cough due to COPD and cough not worse than usual. New symptom of runny nose on 11/17/19.  No other covid symptoms and no travel or known exposure to covid. Pt cannot take BP. Dr Damita Dunnings advise that pt should go to UC for eval, vitals and possible testing., pt voiced understanding and is going to UC in [place]. FYI to Dr Damita Dunnings.

## 2019-11-18 NOTE — Telephone Encounter (Signed)
Thanks.  Routed to PCP as FYI.

## 2019-11-27 DIAGNOSIS — R300 Dysuria: Secondary | ICD-10-CM | POA: Diagnosis not present

## 2019-11-30 ENCOUNTER — Other Ambulatory Visit: Payer: Self-pay | Admitting: Family Medicine

## 2020-01-06 DIAGNOSIS — N179 Acute kidney failure, unspecified: Secondary | ICD-10-CM

## 2020-01-06 HISTORY — DX: Acute kidney failure, unspecified: N17.9

## 2020-01-08 ENCOUNTER — Other Ambulatory Visit: Payer: Self-pay

## 2020-01-08 ENCOUNTER — Emergency Department: Payer: Medicare Other

## 2020-01-08 ENCOUNTER — Inpatient Hospital Stay (HOSPITAL_COMMUNITY)
Admission: AD | Admit: 2020-01-08 | Discharge: 2020-02-19 | DRG: 673 | Disposition: A | Discharge status: Home Health | Payer: Medicare Other | Source: Other Acute Inpatient Hospital | Attending: Internal Medicine | Admitting: Internal Medicine

## 2020-01-08 ENCOUNTER — Inpatient Hospital Stay (HOSPITAL_COMMUNITY): Payer: Medicare Other

## 2020-01-08 ENCOUNTER — Emergency Department
Admission: EM | Admit: 2020-01-08 | Discharge: 2020-01-08 | Disposition: A | Discharge status: Other Acute Inpatient Hospital | Payer: Medicare Other | Attending: Emergency Medicine | Admitting: Emergency Medicine

## 2020-01-08 ENCOUNTER — Encounter: Payer: Self-pay | Admitting: *Deleted

## 2020-01-08 DIAGNOSIS — Z20822 Contact with and (suspected) exposure to covid-19: Secondary | ICD-10-CM | POA: Diagnosis present

## 2020-01-08 DIAGNOSIS — W938XXA Exposure to other excessive cold of man-made origin, initial encounter: Secondary | ICD-10-CM | POA: Insufficient documentation

## 2020-01-08 DIAGNOSIS — F17200 Nicotine dependence, unspecified, uncomplicated: Secondary | ICD-10-CM | POA: Diagnosis not present

## 2020-01-08 DIAGNOSIS — M21372 Foot drop, left foot: Secondary | ICD-10-CM | POA: Diagnosis present

## 2020-01-08 DIAGNOSIS — M5126 Other intervertebral disc displacement, lumbar region: Secondary | ICD-10-CM | POA: Diagnosis not present

## 2020-01-08 DIAGNOSIS — E861 Hypovolemia: Secondary | ICD-10-CM | POA: Diagnosis present

## 2020-01-08 DIAGNOSIS — N179 Acute kidney failure, unspecified: Secondary | ICD-10-CM

## 2020-01-08 DIAGNOSIS — Z825 Family history of asthma and other chronic lower respiratory diseases: Secondary | ICD-10-CM

## 2020-01-08 DIAGNOSIS — Z853 Personal history of malignant neoplasm of breast: Secondary | ICD-10-CM | POA: Diagnosis not present

## 2020-01-08 DIAGNOSIS — Z515 Encounter for palliative care: Secondary | ICD-10-CM

## 2020-01-08 DIAGNOSIS — Z9049 Acquired absence of other specified parts of digestive tract: Secondary | ICD-10-CM | POA: Insufficient documentation

## 2020-01-08 DIAGNOSIS — E119 Type 2 diabetes mellitus without complications: Secondary | ICD-10-CM | POA: Insufficient documentation

## 2020-01-08 DIAGNOSIS — Z638 Other specified problems related to primary support group: Secondary | ICD-10-CM

## 2020-01-08 DIAGNOSIS — L89152 Pressure ulcer of sacral region, stage 2: Secondary | ICD-10-CM | POA: Diagnosis not present

## 2020-01-08 DIAGNOSIS — M25572 Pain in left ankle and joints of left foot: Secondary | ICD-10-CM

## 2020-01-08 DIAGNOSIS — E876 Hypokalemia: Secondary | ICD-10-CM | POA: Diagnosis not present

## 2020-01-08 DIAGNOSIS — Z992 Dependence on renal dialysis: Secondary | ICD-10-CM

## 2020-01-08 DIAGNOSIS — I10 Essential (primary) hypertension: Secondary | ICD-10-CM | POA: Diagnosis present

## 2020-01-08 DIAGNOSIS — Z452 Encounter for adjustment and management of vascular access device: Secondary | ICD-10-CM

## 2020-01-08 DIAGNOSIS — B9620 Unspecified Escherichia coli [E. coli] as the cause of diseases classified elsewhere: Secondary | ICD-10-CM | POA: Diagnosis not present

## 2020-01-08 DIAGNOSIS — N390 Urinary tract infection, site not specified: Secondary | ICD-10-CM | POA: Diagnosis present

## 2020-01-08 DIAGNOSIS — J449 Chronic obstructive pulmonary disease, unspecified: Secondary | ICD-10-CM | POA: Diagnosis not present

## 2020-01-08 DIAGNOSIS — E860 Dehydration: Secondary | ICD-10-CM | POA: Diagnosis present

## 2020-01-08 DIAGNOSIS — E875 Hyperkalemia: Secondary | ICD-10-CM | POA: Diagnosis not present

## 2020-01-08 DIAGNOSIS — M5116 Intervertebral disc disorders with radiculopathy, lumbar region: Secondary | ICD-10-CM | POA: Diagnosis present

## 2020-01-08 DIAGNOSIS — L89626 Pressure-induced deep tissue damage of left heel: Secondary | ICD-10-CM | POA: Diagnosis present

## 2020-01-08 DIAGNOSIS — R809 Proteinuria, unspecified: Secondary | ICD-10-CM | POA: Diagnosis not present

## 2020-01-08 DIAGNOSIS — J9811 Atelectasis: Secondary | ICD-10-CM | POA: Diagnosis not present

## 2020-01-08 DIAGNOSIS — D631 Anemia in chronic kidney disease: Secondary | ICD-10-CM | POA: Diagnosis present

## 2020-01-08 DIAGNOSIS — R0602 Shortness of breath: Secondary | ICD-10-CM

## 2020-01-08 DIAGNOSIS — Z86718 Personal history of other venous thrombosis and embolism: Secondary | ICD-10-CM

## 2020-01-08 DIAGNOSIS — J9601 Acute respiratory failure with hypoxia: Secondary | ICD-10-CM | POA: Diagnosis present

## 2020-01-08 DIAGNOSIS — Z9911 Dependence on respirator [ventilator] status: Secondary | ICD-10-CM | POA: Diagnosis not present

## 2020-01-08 DIAGNOSIS — R062 Wheezing: Secondary | ICD-10-CM

## 2020-01-08 DIAGNOSIS — I120 Hypertensive chronic kidney disease with stage 5 chronic kidney disease or end stage renal disease: Secondary | ICD-10-CM | POA: Diagnosis not present

## 2020-01-08 DIAGNOSIS — E872 Acidosis, unspecified: Secondary | ICD-10-CM | POA: Diagnosis present

## 2020-01-08 DIAGNOSIS — M5117 Intervertebral disc disorders with radiculopathy, lumbosacral region: Secondary | ICD-10-CM | POA: Diagnosis present

## 2020-01-08 DIAGNOSIS — T68XXXA Hypothermia, initial encounter: Secondary | ICD-10-CM | POA: Diagnosis not present

## 2020-01-08 DIAGNOSIS — Z820 Family history of epilepsy and other diseases of the nervous system: Secondary | ICD-10-CM

## 2020-01-08 DIAGNOSIS — M25472 Effusion, left ankle: Secondary | ICD-10-CM | POA: Diagnosis present

## 2020-01-08 DIAGNOSIS — E8770 Fluid overload, unspecified: Secondary | ICD-10-CM | POA: Diagnosis not present

## 2020-01-08 DIAGNOSIS — M5136 Other intervertebral disc degeneration, lumbar region: Secondary | ICD-10-CM | POA: Diagnosis not present

## 2020-01-08 DIAGNOSIS — E782 Mixed hyperlipidemia: Secondary | ICD-10-CM | POA: Diagnosis present

## 2020-01-08 DIAGNOSIS — E1129 Type 2 diabetes mellitus with other diabetic kidney complication: Secondary | ICD-10-CM | POA: Diagnosis not present

## 2020-01-08 DIAGNOSIS — E1110 Type 2 diabetes mellitus with ketoacidosis without coma: Secondary | ICD-10-CM | POA: Diagnosis present

## 2020-01-08 DIAGNOSIS — E1159 Type 2 diabetes mellitus with other circulatory complications: Secondary | ICD-10-CM | POA: Diagnosis present

## 2020-01-08 DIAGNOSIS — Z79899 Other long term (current) drug therapy: Secondary | ICD-10-CM

## 2020-01-08 DIAGNOSIS — N281 Cyst of kidney, acquired: Secondary | ICD-10-CM | POA: Diagnosis not present

## 2020-01-08 DIAGNOSIS — Z9114 Patient's other noncompliance with medication regimen: Secondary | ICD-10-CM

## 2020-01-08 DIAGNOSIS — E1143 Type 2 diabetes mellitus with diabetic autonomic (poly)neuropathy: Secondary | ICD-10-CM | POA: Diagnosis not present

## 2020-01-08 DIAGNOSIS — E1122 Type 2 diabetes mellitus with diabetic chronic kidney disease: Secondary | ICD-10-CM | POA: Diagnosis not present

## 2020-01-08 DIAGNOSIS — N186 End stage renal disease: Secondary | ICD-10-CM | POA: Diagnosis present

## 2020-01-08 DIAGNOSIS — J90 Pleural effusion, not elsewhere classified: Secondary | ICD-10-CM | POA: Diagnosis not present

## 2020-01-08 DIAGNOSIS — T730XXA Starvation, initial encounter: Secondary | ICD-10-CM | POA: Diagnosis present

## 2020-01-08 DIAGNOSIS — I959 Hypotension, unspecified: Secondary | ICD-10-CM | POA: Diagnosis not present

## 2020-01-08 DIAGNOSIS — E785 Hyperlipidemia, unspecified: Secondary | ICD-10-CM | POA: Diagnosis not present

## 2020-01-08 DIAGNOSIS — L8990 Pressure ulcer of unspecified site, unspecified stage: Secondary | ICD-10-CM | POA: Diagnosis present

## 2020-01-08 DIAGNOSIS — Z7984 Long term (current) use of oral hypoglycemic drugs: Secondary | ICD-10-CM

## 2020-01-08 DIAGNOSIS — Z9119 Patient's noncompliance with other medical treatment and regimen: Secondary | ICD-10-CM

## 2020-01-08 DIAGNOSIS — N19 Unspecified kidney failure: Secondary | ICD-10-CM | POA: Diagnosis not present

## 2020-01-08 DIAGNOSIS — Z7951 Long term (current) use of inhaled steroids: Secondary | ICD-10-CM

## 2020-01-08 DIAGNOSIS — F17210 Nicotine dependence, cigarettes, uncomplicated: Secondary | ICD-10-CM | POA: Diagnosis present

## 2020-01-08 DIAGNOSIS — R609 Edema, unspecified: Secondary | ICD-10-CM | POA: Diagnosis not present

## 2020-01-08 DIAGNOSIS — N172 Acute kidney failure with medullary necrosis: Secondary | ICD-10-CM | POA: Diagnosis not present

## 2020-01-08 DIAGNOSIS — Z4682 Encounter for fitting and adjustment of non-vascular catheter: Secondary | ICD-10-CM | POA: Diagnosis not present

## 2020-01-08 DIAGNOSIS — J441 Chronic obstructive pulmonary disease with (acute) exacerbation: Secondary | ICD-10-CM | POA: Diagnosis not present

## 2020-01-08 DIAGNOSIS — N3000 Acute cystitis without hematuria: Secondary | ICD-10-CM | POA: Diagnosis not present

## 2020-01-08 DIAGNOSIS — I152 Hypertension secondary to endocrine disorders: Secondary | ICD-10-CM | POA: Diagnosis present

## 2020-01-08 DIAGNOSIS — Z8261 Family history of arthritis: Secondary | ICD-10-CM

## 2020-01-08 DIAGNOSIS — Z4901 Encounter for fitting and adjustment of extracorporeal dialysis catheter: Secondary | ICD-10-CM | POA: Diagnosis not present

## 2020-01-08 DIAGNOSIS — Z743 Need for continuous supervision: Secondary | ICD-10-CM | POA: Diagnosis not present

## 2020-01-08 DIAGNOSIS — M7989 Other specified soft tissue disorders: Secondary | ICD-10-CM | POA: Diagnosis not present

## 2020-01-08 DIAGNOSIS — Z818 Family history of other mental and behavioral disorders: Secondary | ICD-10-CM

## 2020-01-08 LAB — COMPREHENSIVE METABOLIC PANEL
ALT: 26 U/L (ref 0–44)
ALT: 21 U/L (ref 0–44)
AST: 17 U/L (ref 15–41)
AST: 18 U/L (ref 15–41)
Albumin: 3.5 g/dL (ref 3.5–5.0)
Albumin: 2.1 g/dL — ABNORMAL LOW (ref 3.5–5.0)
Alkaline Phosphatase: 120 U/L (ref 38–126)
Alkaline Phosphatase: 87 U/L (ref 38–126)
Anion gap: 26 — ABNORMAL HIGH (ref 5–15)
BUN: 225 mg/dL — ABNORMAL HIGH (ref 8–23)
BUN: 196 mg/dL — ABNORMAL HIGH (ref 8–23)
CO2: <7 mmol/L — ABNORMAL LOW (ref 22–32)
CO2: 8 mmol/L — ABNORMAL LOW (ref 22–32)
Calcium: 6.5 mg/dL — ABNORMAL LOW (ref 8.9–10.3)
Calcium: 5.6 mg/dL — CL (ref 8.9–10.3)
Chloride: 90 mmol/L — ABNORMAL LOW (ref 98–111)
Chloride: 98 mmol/L (ref 98–111)
Creatinine, Ser: 15.43 mg/dL — ABNORMAL HIGH (ref 0.44–1.00)
Creatinine, Ser: 13.36 mg/dL — ABNORMAL HIGH (ref 0.44–1.00)
GFR calc Af Amer: 2 mL/min — ABNORMAL LOW (ref >60)
GFR calc Af Amer: 3 mL/min — ABNORMAL LOW (ref >60)
GFR calc non Af Amer: 2 mL/min — ABNORMAL LOW (ref >60)
GFR calc non Af Amer: 2 mL/min — ABNORMAL LOW (ref >60)
Glucose, Bld: 225 mg/dL — ABNORMAL HIGH (ref 70–99)
Glucose, Bld: 323 mg/dL — ABNORMAL HIGH (ref 70–99)
Potassium: 7.4 mmol/L (ref 3.5–5.1)
Potassium: 4.3 mmol/L (ref 3.5–5.1)
Sodium: 126 mmol/L — ABNORMAL LOW (ref 135–145)
Sodium: 132 mmol/L — ABNORMAL LOW (ref 135–145)
Total Bilirubin: 0.9 mg/dL (ref 0.3–1.2)
Total Bilirubin: 0.8 mg/dL (ref 0.3–1.2)
Total Protein: 8.2 g/dL — ABNORMAL HIGH (ref 6.5–8.1)
Total Protein: 5.3 g/dL — ABNORMAL LOW (ref 6.5–8.1)

## 2020-01-08 LAB — URINALYSIS, COMPLETE (UACMP) WITH MICROSCOPIC
Bilirubin Urine: NEGATIVE
Glucose, UA: NEGATIVE mg/dL
Ketones, ur: NEGATIVE mg/dL
Nitrite: NEGATIVE
Protein, ur: 100 mg/dL — AB
Specific Gravity, Urine: 1.009 (ref 1.005–1.030)
Squamous Epithelial / HPF: NONE SEEN (ref 0–5)
WBC, UA: >50 WBC/hpf — ABNORMAL HIGH (ref 0–5)
pH: 5 (ref 5.0–8.0)

## 2020-01-08 LAB — POCT I-STAT 7, (LYTES, BLD GAS, ICA,H+H)
Acid-base deficit: 18 mmol/L — ABNORMAL HIGH (ref 0.0–2.0)
Bicarbonate: 9.5 mmol/L — ABNORMAL LOW (ref 20.0–28.0)
Calcium, Ion: 0.8 mmol/L — CL (ref 1.15–1.40)
HCT: 19 % — ABNORMAL LOW (ref 36.0–46.0)
Hemoglobin: 6.5 g/dL — CL (ref 12.0–15.0)
O2 Saturation: 99 %
Patient temperature: 36.2
Potassium: 4.5 mmol/L (ref 3.5–5.1)
Sodium: 131 mmol/L — ABNORMAL LOW (ref 135–145)
TCO2: 10 mmol/L — ABNORMAL LOW (ref 22–32)
pCO2 arterial: 29.2 mmHg — ABNORMAL LOW (ref 32.0–48.0)
pH, Arterial: 7.114 — CL (ref 7.350–7.450)
pO2, Arterial: 177 mmHg — ABNORMAL HIGH (ref 83.0–108.0)

## 2020-01-08 LAB — RESPIRATORY PANEL BY RT PCR (FLU A&B, COVID)
Influenza A by PCR: NEGATIVE
Influenza B by PCR: NEGATIVE
SARS Coronavirus 2 by RT PCR: NEGATIVE

## 2020-01-08 LAB — CBC
HCT: 20.5 % — ABNORMAL LOW (ref 36.0–46.0)
Hemoglobin: 6.8 g/dL — CL (ref 12.0–15.0)
MCH: 28.6 pg (ref 26.0–34.0)
MCHC: 33.2 g/dL (ref 30.0–36.0)
MCV: 86.1 fL (ref 80.0–100.0)
Platelets: 216 10*3/uL (ref 150–400)
RBC: 2.38 MIL/uL — ABNORMAL LOW (ref 3.87–5.11)
RDW: 14.5 % (ref 11.5–15.5)
WBC: 13.4 10*3/uL — ABNORMAL HIGH (ref 4.0–10.5)
nRBC: 0.1 % (ref 0.0–0.2)

## 2020-01-08 LAB — BLOOD GAS, ARTERIAL
Acid-base deficit: 24.7 mmol/L — ABNORMAL HIGH (ref 0.0–2.0)
Acid-base deficit: 22.7 mmol/L — ABNORMAL HIGH (ref 0.0–2.0)
Allens test (pass/fail): POSITIVE — AB
Bicarbonate: 3 mmol/L — ABNORMAL LOW (ref 20.0–28.0)
Bicarbonate: 6.5 mmol/L — ABNORMAL LOW (ref 20.0–28.0)
FIO2: 24
FIO2: 24
MECHVT: 450 mL
Mechanical Rate: 28
O2 Saturation: 98.8 %
O2 Saturation: 97.2 %
PEEP: 5 cmH2O
Patient temperature: 37
Patient temperature: 37
pCO2 arterial: <19 mmHg — CL (ref 32.0–48.0)
pCO2 arterial: 24 mmHg — ABNORMAL LOW (ref 32.0–48.0)
pH, Arterial: 7.08 — CL (ref 7.350–7.450)
pH, Arterial: 7.04 — CL (ref 7.350–7.450)
pO2, Arterial: 166 mmHg — ABNORMAL HIGH (ref 83.0–108.0)
pO2, Arterial: 129 mmHg — ABNORMAL HIGH (ref 83.0–108.0)

## 2020-01-08 LAB — CBC WITH DIFFERENTIAL/PLATELET
Abs Immature Granulocytes: 0.33 10*3/uL — ABNORMAL HIGH (ref 0.00–0.07)
Basophils Absolute: 0 10*3/uL (ref 0.0–0.1)
Basophils Relative: 0 %
Eosinophils Absolute: 0 10*3/uL (ref 0.0–0.5)
Eosinophils Relative: 0 %
HCT: 26.1 % — ABNORMAL LOW (ref 36.0–46.0)
Hemoglobin: 9 g/dL — ABNORMAL LOW (ref 12.0–15.0)
Immature Granulocytes: 2 %
Lymphocytes Relative: 2 %
Lymphs Abs: 0.4 10*3/uL — ABNORMAL LOW (ref 0.7–4.0)
MCH: 28.7 pg (ref 26.0–34.0)
MCHC: 34.5 g/dL (ref 30.0–36.0)
MCV: 83.1 fL (ref 80.0–100.0)
Monocytes Absolute: 0.2 10*3/uL (ref 0.1–1.0)
Monocytes Relative: 1 %
Neutro Abs: 17.5 10*3/uL — ABNORMAL HIGH (ref 1.7–7.7)
Neutrophils Relative %: 95 %
Platelets: 330 10*3/uL (ref 150–400)
RBC: 3.14 MIL/uL — ABNORMAL LOW (ref 3.87–5.11)
RDW: 14.4 % (ref 11.5–15.5)
WBC: 18.4 10*3/uL — ABNORMAL HIGH (ref 4.0–10.5)
nRBC: 0 % (ref 0.0–0.2)

## 2020-01-08 LAB — TROPONIN I (HIGH SENSITIVITY)
Troponin I (High Sensitivity): 10 ng/L (ref <18)
Troponin I (High Sensitivity): 10 ng/L (ref <18)

## 2020-01-08 LAB — GLUCOSE, RANDOM: Glucose, Bld: 273 mg/dL — ABNORMAL HIGH (ref 70–99)

## 2020-01-08 LAB — MAGNESIUM: Magnesium: 1.2 mg/dL — ABNORMAL LOW (ref 1.7–2.4)

## 2020-01-08 LAB — NA AND K (SODIUM & POTASSIUM), RAND UR
Potassium Urine: 23 mmol/L
Sodium, Ur: 77 mmol/L

## 2020-01-08 LAB — BLOOD GAS, VENOUS
Acid-base deficit: 24.9 mmol/L — ABNORMAL HIGH (ref 0.0–2.0)
Bicarbonate: 3.8 mmol/L — ABNORMAL LOW (ref 20.0–28.0)
O2 Saturation: 83.6 %
Patient temperature: 37
pCO2, Ven: <19 mmHg — CL (ref 44.0–60.0)
pH, Ven: 7.04 — CL (ref 7.250–7.430)
pO2, Ven: 71 mmHg — ABNORMAL HIGH (ref 32.0–45.0)

## 2020-01-08 LAB — LACTIC ACID, PLASMA
Lactic Acid, Venous: 1.1 mmol/L (ref 0.5–1.9)
Lactic Acid, Venous: 3.7 mmol/L (ref 0.5–1.9)
Lactic Acid, Venous: 2.1 mmol/L (ref 0.5–1.9)

## 2020-01-08 LAB — GLUCOSE, CAPILLARY
Glucose-Capillary: 239 mg/dL — ABNORMAL HIGH (ref 70–99)
Glucose-Capillary: 299 mg/dL — ABNORMAL HIGH (ref 70–99)
Glucose-Capillary: 317 mg/dL — ABNORMAL HIGH (ref 70–99)

## 2020-01-08 LAB — CK: Total CK: 178 U/L (ref 38–234)

## 2020-01-08 LAB — BRAIN NATRIURETIC PEPTIDE: B Natriuretic Peptide: 70 pg/mL (ref 0.0–100.0)

## 2020-01-08 LAB — TSH: TSH: 2.362 u[IU]/mL (ref 0.350–4.500)

## 2020-01-08 MED ORDER — ORAL CARE MOUTH RINSE
15.0000 mL | OROMUCOSAL | Status: DC
  Administered 2020-01-09 – 2020-01-11 (×24): 15 mL via OROMUCOSAL

## 2020-01-08 MED ORDER — CALCIUM GLUCONATE-NACL 1-0.675 GM/50ML-% IV SOLN
1.0000 g | Freq: Once | INTRAVENOUS | Status: AC
  Administered 2020-01-08: 1000 mg via INTRAVENOUS
  Filled 2020-01-08: qty 50

## 2020-01-08 MED ORDER — HEPARIN SODIUM (PORCINE) 1000 UNIT/ML DIALYSIS
1000.0000 [IU] | INTRAMUSCULAR | Status: DC | PRN
  Administered 2020-01-11: 3500 [IU] via INTRAVENOUS_CENTRAL
  Filled 2020-01-08 (×3): qty 6

## 2020-01-08 MED ORDER — PRISMASOL BGK 4/2.5 32-4-2.5 MEQ/L REPLACEMENT SOLN
Status: DC
  Filled 2020-01-08 (×6): qty 5000

## 2020-01-08 MED ORDER — PANTOPRAZOLE SODIUM 40 MG IV SOLR
40.0000 mg | INTRAVENOUS | Status: DC
  Administered 2020-01-09 (×2): 40 mg via INTRAVENOUS
  Filled 2020-01-08 (×2): qty 40

## 2020-01-08 MED ORDER — FAMOTIDINE IN NACL 20-0.9 MG/50ML-% IV SOLN
20.0000 mg | INTRAVENOUS | Status: DC
  Administered 2020-01-09: 20 mg via INTRAVENOUS
  Filled 2020-01-08: qty 50

## 2020-01-08 MED ORDER — STERILE WATER FOR INJECTION IV SOLN
INTRAVENOUS | Status: DC
  Filled 2020-01-08 (×3): qty 150

## 2020-01-08 MED ORDER — SODIUM CHLORIDE 0.9 % IV SOLN: INTRAVENOUS | Status: DC | PRN

## 2020-01-08 MED ORDER — ALTEPLASE 2 MG IJ SOLR
2.0000 mg | Freq: Once | INTRAMUSCULAR | Status: DC | PRN
  Filled 2020-01-08: qty 2

## 2020-01-08 MED ORDER — SODIUM BICARBONATE 8.4 % IV SOLN
50.0000 meq | Freq: Once | INTRAVENOUS | Status: AC
  Administered 2020-01-08: 50 meq via INTRAVENOUS

## 2020-01-08 MED ORDER — INSULIN ASPART 100 UNIT/ML ~~LOC~~ SOLN: 0.0000 [IU] | Freq: Three times a day (TID) | SUBCUTANEOUS | Status: DC

## 2020-01-08 MED ORDER — METHYLPREDNISOLONE SODIUM SUCC 125 MG IJ SOLR
125.0000 mg | Freq: Once | INTRAMUSCULAR | Status: AC
  Administered 2020-01-08: 125 mg via INTRAVENOUS

## 2020-01-08 MED ORDER — SODIUM CHLORIDE 0.9 % IV BOLUS
1000.0000 mL | Freq: Once | INTRAVENOUS | Status: AC
  Administered 2020-01-08: 1000 mL via INTRAVENOUS

## 2020-01-08 MED ORDER — FENTANYL 2500MCG IN NS 250ML (10MCG/ML) PREMIX INFUSION
0.0000 ug/h | INTRAVENOUS | Status: DC
  Administered 2020-01-08: 25 ug/h via INTRAVENOUS
  Filled 2020-01-08: qty 250

## 2020-01-08 MED ORDER — STERILE WATER FOR INJECTION IV SOLN
INTRAVENOUS | Status: DC
  Filled 2020-01-08 (×3): qty 850

## 2020-01-08 MED ORDER — SODIUM CHLORIDE 0.9 % IV BOLUS
1000.0000 mL | Freq: Once | INTRAVENOUS | Status: AC
  Administered 2020-01-08: 17:00:00 1000 mL via INTRAVENOUS

## 2020-01-08 MED ORDER — SODIUM CHLORIDE 0.9% IV SOLUTION: Freq: Once | INTRAVENOUS | Status: AC

## 2020-01-08 MED ORDER — SODIUM BICARBONATE 8.4 % IV SOLN
50.0000 meq | Freq: Once | INTRAVENOUS | Status: AC
  Administered 2020-01-08: 50 meq via INTRAVENOUS
  Filled 2020-01-08: qty 50

## 2020-01-08 MED ORDER — MIDAZOLAM HCL 5 MG/5ML IJ SOLN
2.0000 mg | Freq: Once | INTRAMUSCULAR | Status: AC
  Administered 2020-01-08: 19:00:00 2 mg via INTRAVENOUS

## 2020-01-08 MED ORDER — ALBUTEROL SULFATE (2.5 MG/3ML) 0.083% IN NEBU
10.0000 mg | INHALATION_SOLUTION | Freq: Once | RESPIRATORY_TRACT | Status: AC
  Administered 2020-01-08: 17:00:00 10 mg via RESPIRATORY_TRACT
  Filled 2020-01-08: qty 12

## 2020-01-08 MED ORDER — SODIUM CHLORIDE 0.9 % IV SOLN
1.0000 g | INTRAVENOUS | Status: AC
  Administered 2020-01-09 – 2020-01-14 (×7): 1 g via INTRAVENOUS
  Filled 2020-01-08 (×4): qty 1
  Filled 2020-01-08: qty 10
  Filled 2020-01-08 (×3): qty 1

## 2020-01-08 MED ORDER — SODIUM CHLORIDE 0.9 % IV SOLN
1.0000 g | Freq: Once | INTRAVENOUS | Status: DC
  Filled 2020-01-08: qty 10

## 2020-01-08 MED ORDER — MAGNESIUM SULFATE 2 GM/50ML IV SOLN
2.0000 g | Freq: Once | INTRAVENOUS | Status: AC
  Administered 2020-01-09: 2 g via INTRAVENOUS
  Filled 2020-01-08: qty 50

## 2020-01-08 MED ORDER — IPRATROPIUM-ALBUTEROL 0.5-2.5 (3) MG/3ML IN SOLN
6.0000 mL | Freq: Once | RESPIRATORY_TRACT | Status: AC
  Administered 2020-01-08: 6 mL via RESPIRATORY_TRACT
  Filled 2020-01-08: qty 6

## 2020-01-08 MED ORDER — ETOMIDATE 2 MG/ML IV SOLN
15.0000 mg | Freq: Once | INTRAVENOUS | Status: AC
  Administered 2020-01-08: 19:00:00 15 mg via INTRAVENOUS

## 2020-01-08 MED ORDER — INSULIN ASPART 100 UNIT/ML IV SOLN
5.0000 [IU] | Freq: Once | INTRAVENOUS | Status: AC
  Administered 2020-01-08: 5 [IU] via INTRAVENOUS
  Filled 2020-01-08: qty 0.05

## 2020-01-08 MED ORDER — MIDAZOLAM 50MG/50ML (1MG/ML) PREMIX INFUSION
0.5000 mg/h | INTRAVENOUS | Status: DC
  Administered 2020-01-08: 0.5 mg/h via INTRAVENOUS
  Filled 2020-01-08: qty 50

## 2020-01-08 MED ORDER — SODIUM CHLORIDE 0.9 % IV SOLN
1.0000 g | Freq: Once | INTRAVENOUS | Status: AC
  Administered 2020-01-09: 1 g via INTRAVENOUS
  Filled 2020-01-08: qty 10

## 2020-01-08 MED ORDER — SODIUM BICARBONATE 8.4 % IV SOLN
INTRAVENOUS | Status: AC
  Filled 2020-01-08: qty 50

## 2020-01-08 MED ORDER — SODIUM CHLORIDE 0.9 % IV SOLN
1.0000 g | Freq: Once | INTRAVENOUS | Status: AC
  Administered 2020-01-08: 1 g via INTRAVENOUS
  Filled 2020-01-08: qty 10

## 2020-01-08 MED ORDER — FENTANYL CITRATE (PF) 100 MCG/2ML IJ SOLN
75.0000 ug | Freq: Once | INTRAMUSCULAR | Status: AC
  Administered 2020-01-08: 75 ug via INTRAVENOUS

## 2020-01-08 MED ORDER — INSULIN ASPART 100 UNIT/ML ~~LOC~~ SOLN: 0.0000 [IU] | Freq: Every day | SUBCUTANEOUS | Status: DC

## 2020-01-08 MED ORDER — HEPARIN SODIUM (PORCINE) 5000 UNIT/ML IJ SOLN
5000.0000 [IU] | Freq: Three times a day (TID) | INTRAMUSCULAR | Status: DC
  Administered 2020-01-09 – 2020-01-29 (×62): 5000 [IU] via SUBCUTANEOUS
  Filled 2020-01-08 (×59): qty 1

## 2020-01-08 MED ORDER — ROCURONIUM BROMIDE 50 MG/5ML IV SOLN
60.0000 mg | Freq: Once | INTRAVENOUS | Status: AC
  Administered 2020-01-08: 19:00:00 60 mg via INTRAVENOUS
  Filled 2020-01-08: qty 6

## 2020-01-08 MED ORDER — CALCIUM GLUCONATE 10 % IV SOLN
1.0000 g | Freq: Once | INTRAVENOUS | Status: AC
  Administered 2020-01-08: 1 g via INTRAVENOUS
  Filled 2020-01-08: qty 10

## 2020-01-08 MED ORDER — METHYLPREDNISOLONE SODIUM SUCC 125 MG IJ SOLR
125.0000 mg | Freq: Once | INTRAMUSCULAR | Status: DC
  Filled 2020-01-08: qty 2

## 2020-01-08 MED ORDER — PRISMASOL BGK 4/2.5 32-4-2.5 MEQ/L IV SOLN
INTRAVENOUS | Status: DC
  Filled 2020-01-08 (×38): qty 5000

## 2020-01-08 MED ORDER — VANCOMYCIN HCL IN DEXTROSE 1-5 GM/200ML-% IV SOLN
1000.0000 mg | Freq: Once | INTRAVENOUS | Status: AC
  Administered 2020-01-08: 1000 mg via INTRAVENOUS
  Filled 2020-01-08: qty 200

## 2020-01-08 MED ORDER — INSULIN GLARGINE 100 UNIT/ML ~~LOC~~ SOLN
5.0000 [IU] | Freq: Every day | SUBCUTANEOUS | Status: DC
  Administered 2020-01-09: 5 [IU] via SUBCUTANEOUS
  Filled 2020-01-08 (×2): qty 0.05

## 2020-01-08 MED ORDER — CHLORHEXIDINE GLUCONATE 0.12% ORAL RINSE (MEDLINE KIT)
15.0000 mL | Freq: Two times a day (BID) | OROMUCOSAL | Status: DC
  Administered 2020-01-09 – 2020-01-11 (×6): 15 mL via OROMUCOSAL

## 2020-01-08 NOTE — ED Notes (Signed)
Patient intubated, tube at 24.

## 2020-01-08 NOTE — ED Notes (Signed)
Bair Hugger applied at 1430.

## 2020-01-08 NOTE — ED Notes (Signed)
Patient remains restless, tachypneic. Pulse ox is 100%. Patient repositioned on stretcher.

## 2020-01-08 NOTE — ED Notes (Addendum)
Bladder scan was 237ml. Dr. Ellender Hose aware.

## 2020-01-08 NOTE — ED Notes (Signed)
Patient states she has been non-compliant with meds.

## 2020-01-08 NOTE — ED Provider Notes (Signed)
[facility] Emergency Department Provider Note  ____________________________________________  Time seen: Approximately 1:49 PM  I have reviewed the triage vital signs and the nursing notes.   HISTORY  Chief Complaint Shortness of Breath    HPI Cindy Burns is a 74 y.o. female who presents the emergency department complaining of shortness of breath.  Patient was brought to the emergency department via [facility].  They were called out in regards to shortness of breath.  Patient has poor living conditions, has had social work consult in the past due to poor living conditions.  They report that patient met them outside of her residence complaining of shortness of breath.  Initially fire department had placed the patient on nonrebreather mask and given increased work of breathing, initial O2 saturations.  On EMS arrival, patient was at 100%.  She was transitioned to a nasal cannula at 3 L/min maintaining between 95 and 100% on nasal cannula.  According to EMS, she does have increased work of breathing with a history of COPD.  According to EMS they state that patient has a history consistent with diabetes, COPD, high blood pressure depression, but states that the patient denies taking any medications.  Patient is endorsing 4 days of increasing shortness of breath.  She denies any fevers or chills, nasal congestion, sore throat.  She does have a cough which she states is chronic.  Patient denies any belly pain, dysuria, polyuria, hematuria.  Acknowledges her medical history but denies taking any medications for same.  Unsure when patient would have last taken her prescribed medications.         Past Medical History:  Diagnosis Date  . Cancer [facility]) 2005   Breast Cancer  . COPD (chronic obstructive pulmonary disease) ([place])   . Depression   . Diabetes mellitus without complication ([place])   . Hyperlipidemia     Patient Active Problem List   Diagnosis Date Noted  .  Hepatomegaly 07/30/2019  . Right carotid bruit 07/30/2019  . SIADH (syndrome of inappropriate ADH production) ([place]) 04/06/2019  . B12 deficiency 04/06/2019  . Balance problem 03/18/2019  . Ventral hernia 04/03/2017  . Hypertension 02/28/2017  . Chronic insomnia 10/26/2013  . Diabetic autonomic neuropathy ([place]) 08/12/2013  . PULMONARY NODULE 09/27/2009  . ADENOCARCINOMA, BREAST 02/28/2009  . Normocytic anemia 06/24/2008  . Moderate COPD (chronic obstructive pulmonary disease) ([place]) 03/14/2008  . Major depressive disorder, recurrent episode, moderate ([place]) 08/11/2007  . Diabetes mellitus due to underlying condition, controlled, with neurologic complication ([place]) 17/79/3903  . HYPERLIPIDEMIA, MIXED 04/30/2007  . CIGARETTE SMOKER 04/22/2007    Past Surgical History:  Procedure Laterality Date  . BREAST SURGERY  2005  . CATARACT EXTRACTION    . CHOLECYSTECTOMY  2013    Prior to Admission medications   Not on File    Allergies Patient has no known allergies.  Family History  Problem Relation Age of Onset  . Arthritis Mother   . Alzheimer's disease Mother   . Dementia Father   . COPD Brother   . COPD Brother     Social History Social History   Tobacco Use  . Smoking status: Current Every Day Smoker    Packs/day: 1.00    Years: 40.00    Pack years: 40.00    Types: Cigarettes  . Smokeless tobacco: Never Used  Substance Use Topics  . Alcohol use: No  . Drug use: No     Review of Systems  Constitutional: No fever/chills Eyes: No visual  changes. No discharge ENT: No upper respiratory complaints. Cardiovascular: no chest pain. Respiratory: Chronic cough.  Increasing shortness of breath x4 days Gastrointestinal: No abdominal pain.  No nausea, no vomiting.  No diarrhea.  No constipation. Genitourinary: Negative for dysuria. No hematuria Musculoskeletal: Negative for musculoskeletal pain. Skin: Negative for rash, abrasions, lacerations, ecchymosis. Neurological:  Negative for headaches, focal weakness or numbness. 10-point ROS otherwise negative.  ____________________________________________   PHYSICAL EXAM:  VITAL SIGNS: ED Triage Vitals  Enc Vitals Group     BP      Pulse      Resp      Temp      Temp src      SpO2      Weight      Height      Head Circumference      Peak Flow      Pain Score      Pain Loc      Pain Edu?      Excl. in [place]?      Constitutional: Alert and oriented. Well appearing and in moderate respiratory distress. Eyes: Conjunctivae are normal. PERRL. EOMI. Head: Atraumatic. ENT:      Ears:       Nose: No congestion/rhinnorhea.      Mouth/Throat: Mucous membranes are moist.  Oropharynx is nonerythematous and nonedematous.  Uvula is midline. Neck: No stridor.  Neck is supple full range of motion Hematological/Lymphatic/Immunilogical: No cervical lymphadenopathy. Cardiovascular: Normal rate, regular rhythm. Normal S1 and S2.  Good peripheral circulation. Respiratory: Increased respiratory effort with tachypnea, retractions, belly breathing.. Lungs with inspiratory and expiratory wheezing in all lung fields.  Faint inspiratory wheezing, significant expiratory wheezes.  No rales or rhonchi.Kermit Balo air entry to the bases with no decreased or absent breath sounds. Gastrointestinal: Belly breathing.  Umbilical hernia which is soft, nontender and and reducible.  Bowel sounds 4 quadrants. Soft and nontender to palpation. No guarding or rigidity. No palpable masses. No distention. No CVA tenderness. Musculoskeletal: Full range of motion to all extremities. No gross deformities appreciated. Neurologic:  Normal speech and language. No gross focal neurologic deficits are appreciated.  Skin:  Skin is warm, dry and intact. No rash noted. Psychiatric: Mood and affect are normal. Speech and behavior are normal. Patient exhibits appropriate insight and judgement.   ____________________________________________   LABS (all labs  ordered are listed, but only abnormal results are displayed)  Labs Reviewed  CBC WITH DIFFERENTIAL/PLATELET - Abnormal; Notable for the following components:      Result Value   WBC 18.4 (*)    RBC 3.14 (*)    Hemoglobin 9.0 (*)    HCT 26.1 (*)    Neutro Abs 17.5 (*)    Lymphs Abs 0.4 (*)    Abs Immature Granulocytes 0.33 (*)    All other components within normal limits  COMPREHENSIVE METABOLIC PANEL - Abnormal; Notable for the following components:   Sodium 126 (*)    Potassium 7.4 (*)    Chloride 90 (*)    CO2 <7 (*)    Glucose, Bld 225 (*)    BUN 225 (*)    Creatinine, Ser 15.43 (*)    Calcium 6.5 (*)    Total Protein 8.2 (*)    GFR calc non Af Amer 2 (*)    GFR calc Af Amer 2 (*)    All other components within normal limits  URINALYSIS, COMPLETE (UACMP) WITH MICROSCOPIC - Abnormal; Notable for the following components:   Color,  Urine YELLOW (*)    APPearance TURBID (*)    Hgb urine dipstick MODERATE (*)    Protein, ur 100 (*)    Leukocytes,Ua LARGE (*)    WBC, UA >50 (*)    Bacteria, UA MANY (*)    All other components within normal limits  LACTIC ACID, PLASMA - Abnormal; Notable for the following components:   Lactic Acid, Venous 3.7 (*)    All other components within normal limits  GLUCOSE, RANDOM - Abnormal; Notable for the following components:   Glucose, Bld 273 (*)    All other components within normal limits  BLOOD GAS, VENOUS - Abnormal; Notable for the following components:   pH, Ven 7.04 (*)    pCO2, Ven <19.0 (*)    pO2, Ven 71.0 (*)    Bicarbonate 3.8 (*)    Acid-base deficit 24.9 (*)    All other components within normal limits  BLOOD GAS, ARTERIAL - Abnormal; Notable for the following components:   pH, Arterial 7.08 (*)    pCO2 arterial <19.0 (*)    pO2, Arterial 166 (*)    Bicarbonate 3.0 (*)    Acid-base deficit 24.7 (*)    Allens test (pass/fail) POSITIVE (*)    All other components within normal limits  GLUCOSE, CAPILLARY - Abnormal;  Notable for the following components:   Glucose-Capillary 239 (*)    All other components within normal limits  BLOOD GAS, ARTERIAL - Abnormal; Notable for the following components:   pH, Arterial 7.04 (*)    pCO2 arterial 24 (*)    pO2, Arterial 129 (*)    Bicarbonate 6.5 (*)    Acid-base deficit 22.7 (*)    All other components within normal limits  RESPIRATORY PANEL BY RT PCR (FLU A&B, COVID)  CULTURE, BLOOD (SINGLE)  BRAIN NATRIURETIC PEPTIDE  LACTIC ACID, PLASMA  NA AND K (SODIUM & POTASSIUM), RAND UR  TSH  CK  TROPONIN I (HIGH SENSITIVITY)  TROPONIN I (HIGH SENSITIVITY)   ____________________________________________  EKG   ____________________________________________  RADIOLOGY I personally viewed and evaluated these images as part of my medical decision making, as well as reviewing the written report by the radiologist.  DG Abdomen 1 View  Result Date: 01/08/2020 CLINICAL DATA:  Enteric catheter placement. EXAM: ABDOMEN - 1 VIEW COMPARISON:  January 17, 2005 FINDINGS: The bowel gas pattern is nonobstructive. Enteric catheter overlies the gastric body. IMPRESSION: Enteric catheter overlies the gastric body. Electronically Signed   By: Fidela Salisbury M.D.   On: 01/08/2020 20:00   DG Chest Portable 1 View  Result Date: 01/08/2020 CLINICAL DATA:  Check endotracheal tube placement and central line placement EXAM: PORTABLE CHEST 1 VIEW COMPARISON:  Film from earlier in the same day. FINDINGS: Endotracheal tube is noted in satisfactory position 4 cm above the carina. Left jugular central line is noted in the mid superior vena cava. No pneumothorax is seen. Gastric catheter extends into the stomach. Cardiac shadow is within normal limits. Aortic calcifications are seen. Patchy atelectatic changes are noted in the bases new from the prior exam. Hyperinflation is again seen. IMPRESSION: Tubes and lines in satisfactory position. Mild bibasilar atelectasis. Electronically Signed   By:  Inez Catalina M.D.   On: 01/08/2020 19:59   DG Chest Port 1 View  Result Date: 01/08/2020 CLINICAL DATA:  Increased shortness of breath and chills for 3-4 days. History of COPD. EXAM: PORTABLE CHEST 1 VIEW COMPARISON:  CT, 02/11/2014. FINDINGS: Cardiac silhouette is normal in size. Normal  mediastinal and hilar contours. Lungs are hyperexpanded. Mild interstitial prominence in the lower lungs. No evidence of pneumonia or pulmonary edema. No pleural effusion or pneumothorax. Left axillary vascular clips consistent with prior breast surgery. Skeletal structures are grossly intact. IMPRESSION: 1. No acute cardiopulmonary disease. 2. Lung hyperexpansion consistent with COPD. Electronically Signed   By: Lajean Manes M.D.   On: 01/08/2020 14:51    ____________________________________________    PROCEDURES  Procedure(s) performed:    .Critical Care Performed by: Darletta Moll, PA-C Authorized by: Darletta Moll, PA-C   Critical care provider statement:    Critical care time (minutes):  83   Critical care was necessary to treat or prevent imminent or life-threatening deterioration of the following conditions:  Respiratory failure and sepsis   Critical care was time spent personally by me on the following activities:  Obtaining history from patient or surrogate, examination of patient, evaluation of patient's response to treatment, development of treatment plan with patient or surrogate, ordering and review of laboratory studies, ordering and review of radiographic studies, pulse oximetry, re-evaluation of patient's condition and review of old charts  Procedure Name: Intubation Date/Time: 01/08/2020 7:05 PM Performed by: Darletta Moll, PA-C Pre-anesthesia Checklist: Patient identified, Emergency Drugs available, Suction available, Patient being monitored and Timeout performed Oxygen Delivery Method: Ambu bag Preoxygenation: Pre-oxygenation with 100% oxygen Induction Type:  Rapid sequence Ventilation: Mask ventilation without difficulty Laryngoscope Size: Glidescope and 3 Tube size: 7.5 mm Number of attempts: 1 Airway Equipment and Method: Video-laryngoscopy Placement Confirmation: ETT inserted through vocal cords under direct vision,  Positive ETCO2,  CO2 detector and Breath sounds checked- equal and bilateral Secured at: 24 cm Tube secured with: ETT holder Dental Injury: Teeth and Oropharynx as per pre-operative assessment          Medications  sodium bicarbonate 150 mEq in sterile water 1,000 mL infusion ( Intravenous Transfusing/Transfer 01/08/20 2030)  fentaNYL 2581mg in NS 2561m(1042mml) infusion-PREMIX (0 mcg/hr Intravenous Transfusing/Transfer 01/08/20 2030)  midazolam (VERSED) 50 mg/50 mL (1 mg/mL) premix infusion (0.5 mg/hr Intravenous Transfusing/Transfer 01/08/20 2030)  ipratropium-albuterol (DUONEB) 0.5-2.5 (3) MG/3ML nebulizer solution 6 mL (6 mLs Nebulization Given 01/08/20 1436)  methylPREDNISolone sodium succinate (SOLU-MEDROL) 125 mg/2 mL injection 125 mg (125 mg Intravenous Given 01/08/20 1433)  sodium chloride 0.9 % bolus 1,000 mL (0 mLs Intravenous Stopped 01/08/20 1718)  cefTRIAXone (ROCEPHIN) 1 g in sodium chloride 0.9 % 100 mL IVPB (0 g Intravenous Stopped 01/08/20 1628)  calcium gluconate inj 10% (1 g) URGENT USE ONLY! (1 g Intravenous Given 01/08/20 1639)  albuterol (PROVENTIL) (2.5 MG/3ML) 0.083% nebulizer solution 10 mg (10 mg Nebulization Given 01/08/20 1643)  insulin aspart (novoLOG) injection 5 Units (5 Units Intravenous Given 01/08/20 1655)  sodium bicarbonate injection 50 mEq (50 mEq Intravenous Given 01/08/20 1650)  sodium chloride 0.9 % bolus 1,000 mL (0 mLs Intravenous Stopped 01/08/20 1856)  vancomycin (VANCOCIN) IVPB 1000 mg/200 mL premix (0 mg Intravenous Stopped 01/08/20 1842)  calcium gluconate 1 g/ 50 mL sodium chloride IVPB (0 g Intravenous Stopped 01/08/20 1917)  etomidate (AMIDATE) injection 15 mg (15 mg Intravenous Given 01/08/20  1853)  rocuronium (ZEMURON) injection 60 mg (60 mg Intravenous Given 01/08/20 1854)  sodium bicarbonate injection 50 mEq (50 mEq Intravenous Given 01/08/20 1847)  sodium chloride 0.9 % bolus 1,000 mL (1,000 mLs Intravenous Transfusing/Transfer 01/08/20 2030)  fentaNYL (SUBLIMAZE) injection 75 mcg (75 mcg Intravenous Given 01/08/20 1913)  midazolam (VERSED) 5 MG/5ML injection 2 mg (2 mg Intravenous Given 01/08/20  1914)     ____________________________________________   INITIAL IMPRESSION / ASSESSMENT AND PLAN / ED COURSE  Pertinent labs & imaging results that were available during my care of the patient were reviewed by me and considered in my medical decision making (see chart for details).  Review of the [place] CSRS was performed in accordance of the [place] prior to dispensing any controlled drugs.  Clinical Course as of Jan 07 2106  Sat Jan 08, 2020  1402 Patient presented to the emergency department with increased respiratory effort.  Patient states that she has had 4 days of worsening shortness of breath.  Patient has multiple medical diagnoses and is supposed to be on multiple home medications.  Patient denies taking any medications at home.  Patient had increased respiratory effort, retractions, belly breathing, wheezing.  Patient denied any other complaint other than shortness of breath.  Patient will be evaluated with labs, imaging.   [JC]  3710 Patient presented to the emergency department with respiratory complaints.  Patient had increased work of breathing, significant wheezing, retractions, belly breathing.  Patient's labs are concerning with a potassium of 7.4, sodium 126, bicarb of less than 7, creatinine 15.4.  White blood cell count 18.4 with left shift.  Patient has bacteria, leukocytes in her urine.   [JC]  6269 Patient with acute renal failure, acidemia, hypothermia.  Patient with critical lab findings as described above.  Patient has received fluids, antibiotics, calcium, steroid,  albuterol.  At this time, patient still has increased respiratory effort but is maintaining stable vital signs.  Unfortunately our critical care unit was full, patient would require transfer.  Patient will need emergent dialysis as well.  Patient has been accepted at [facility] for transfer for critical care services as well as Vas-Cath placement for emergent dialysis.   [JC]  4854 Patient has been placed on BiPAP, ABG reveals pH is 7.08, bicarb is 3.0, PCO2 is less than 19.   [JC]    Clinical Course User Index [JC] Sady Monaco, Charline Bills, PA-C          Acidemia, hypothermia, acute renal failure, COPD.  Patient presented to emergency department with shortness of breath x4 days.  Patient has been noncompliant with her at home medications for chronic medical problems.  Initially patient was stable in regards to her vital signs, physical exam.  Patient's labs returned with significant abnormalities.  Unfortunately at this time, CCU was full and patient would require transfer for critical care bed.  Will require emergent dialysis for acute kidney failure.  Patient will require Vas-Cath placement as she is not a dialysis patient currently.  Patient has remained stable here in the emergency department.  She is tachypneic but maintaining good oxygen saturation, blood pressure and pulses.  Patient's labs reveal a pH of 7.04, PCO2 less than 19, bicarb of 3.8, potassium of 7.4, elevated white blood cell count, bacteria in the urine.  Patient has continued to try to compensate.  Patient's temperature down to 91.1 F, tachycardic at 111, respirations of 28 on BiPAP.  pH 7.08, PCO2 less than 19, bicarb 3.0.  Lactic acid 3.7.  Patient started to decompensate while here in the emergency department with increased work of breathing, increased tiredness, worsening or not improving labs..  Patient had increased work of breathing, was becoming visibly tired.  I discussed at length multiple times the patient's condition,  her wishes proceeding forward.  Patient requested that all possible treatments be provided.  After discussing patient's increasing tiredness, work of  breathing, results it was determined that patient would be best managed with intubation.  Patient agrees to this plan.  Patient was given induction medications, intubated successfully.  Patient was maintained here in the emergency department until able to be transferred to [facility] for further management.  Central access was obtained by Dr. Ellender Hose.     ____________________________________________  FINAL CLINICAL IMPRESSION(S) / ED DIAGNOSES  Final diagnoses:  Acute renal failure, unspecified acute renal failure type ([place])  Acidemia  Hypothermia, initial encounter      NEW MEDICATIONS STARTED DURING THIS VISIT:  ED Discharge Orders    None          This chart was dictated using voice recognition software/Dragon. Despite best efforts to proofread, errors can occur which can change the meaning. Any change was purely unintentional.    Brynda Peon 01/08/20 2107    Duffy Bruce, MD 01/09/20 1606

## 2020-01-08 NOTE — Consult Note (Addendum)
Renal Service Consult Note [facility]  Cindy Burns 01/08/2020 [place] Requesting Physician:  Dr Tamala Julian, D.   Reason for Consult:  Renal failure HPI: The patient is a 74 y.o. year-old w/ hx of HL, DM2, COPD, breast Ca (2005), tobacco use presented to [facility], [place] w/ 4d hx of SOB, +chronic cough. No fevers, chills or CP.  Pt was hypoxemic and was given NRB initially, then down to 3L [place] when EMS arrived. In ED labs showed sig renal failure w/ K+ 7 and met acidosis.  Pt was given acute temporizing measures in ED. CXR was negative. Pt was tx'd to [facility] due to lack of ICU beds at [facility].  Asked to see for renal failure.   Pt seen in ICU, on vent sedated. No hx provided. No home meds.        ROS - n/a  Past Medical History  Past Medical History:  Diagnosis Date  . Cancer [facility]) 2005   Breast Cancer  . COPD (chronic obstructive pulmonary disease) ([place])   . Depression   . Diabetes mellitus without complication ([place])   . Hyperlipidemia    Past Surgical History  Past Surgical History:  Procedure Laterality Date  . BREAST SURGERY  2005  . CATARACT EXTRACTION    . CHOLECYSTECTOMY  2013   Family History  Family History  Problem Relation Age of Onset  . Arthritis Mother   . Alzheimer's disease Mother   . Dementia Father   . COPD Brother   . COPD Brother    Social History  reports that she has been smoking cigarettes. She has a 40.00 pack-year smoking history. She has never used smokeless tobacco. She reports that she does not drink alcohol or use drugs. Allergies No Known Allergies Home medications Prior to Admission medications   Not on File     There were no vitals filed for this visit. Exam Gen on vent, sedated, not responsive No rash, cyanosis or gangrene Sclera anicteric, throat w ETT, throat dry No jvd or bruits, flat neck veins Chest clear bilat to bases no rales, wheezing or bronchial BS RRR no MRG Abd soft ntnd no mass or ascites +bs GU foley  draining whitish yellow clear urine MS no joint effusions or deformity Ext no LE edema, some mottling of feet bilat, poor skin turgor Neuro is sedated on the vent    Home meds:  - none listed    BP 130 /70 in ED   Temp 92.5 >> 95 deg F    HR 84 > 118 in ED  RR 23- 28   EKG 2:10 pm #1  - NSR, QRS 113 msec   EKG 5:23 pm #3 - sinus tach, QRS 110 msec   CXR - no active disease   UA turbid mod Hb, large LE, prot 100, many bact/ 11-20 rbc/ >50 wbc   UNa 77    2:12 pm lab > Na 126  K 7.4  CO2 <7  BUN 225  Cr 15.43  Ca 6.5  Alb 3.5  Tprot 8.5    LFT's ok  WBC 18K  Hb 9.0  plt 330    Baseline creat 0.92 Oct 2020  Assessment/ Plan: 1. Renal failure - baseline creat 0.92 in 07/2019. Pt is dry on exam, agree w/ IVF's. Appears to be infected (UTI) but not in shock. No home meds. UA looks infected. Get renal US, urine lytes. Suspect this is AKI.  Cont IVF's at 75- 125 cc/hr for  now. Will need RRT, HD vs CRRT. Await repeat labs.  2. Hyperkalemia - severe, sp temporizing meds at outside ED, repeat K+ pending here.   3. Hypothermia/ ^^WBC - r/o sepsis, getting IV abx, cx's pending. Would rec dc Vanc asap if cx's allow.   4. Hypoxemic resp failure - not sure COPD vs other? Per CCM. On vent. CXR w/o acute changes.  5. Tobacco/ COPD 6. Hx breast Ca - in 2005 7. H/o DM - no meds on home list      Kelly Splinter  MD 01/08/2020, 9:39 PM  Recent Labs  Lab 01/08/20 1412  WBC 18.4*  HGB 9.0*   Recent Labs  Lab 01/08/20 1412  K 7.4*  BUN 225*  CREATININE 15.43*  CALCIUM 6.5*

## 2020-01-08 NOTE — ED Notes (Signed)
Pt's possessions given to [facility], clothing and one necklace

## 2020-01-08 NOTE — ED Notes (Signed)
Arterial blood gas collected by RT

## 2020-01-08 NOTE — ED Notes (Signed)
This RN gave report to [facility], Jannette Fogo

## 2020-01-08 NOTE — ED Notes (Addendum)
Chanda Busing PA-C aware of Lactic acid of 3.7

## 2020-01-08 NOTE — ED Notes (Signed)
Patient is having difficulty leaving Bipap in place. Chanda Busing PA-C at bedside.

## 2020-01-08 NOTE — Progress Notes (Signed)
As [facility]ink RN following for sepsis protocol, noted RN's note that PA aware of elev LA and events that has taken place

## 2020-01-08 NOTE — ED Notes (Signed)
Patient's neighbor Chrissie Noa called and stated he could come pick up the patient. His number is [phone].

## 2020-01-08 NOTE — ED Notes (Addendum)
Patient given ice water per Dr. Ellender Hose

## 2020-01-08 NOTE — Progress Notes (Signed)
CRITICAL VALUE ALERT  Critical Value:  Calcium 5.6  Date & Time Notied:  01/08/20 11:04 PM   Provider Notified: Mickel Baas Gleason, PA  Orders Received/Actions taken: Acknowledged

## 2020-01-08 NOTE — ED Notes (Signed)
75 Dr. Ellender Hose informed of blood gas results.

## 2020-01-08 NOTE — H&P (Signed)
NAME:  Cindy Burns, MRN:  [id number], DOB:  January 21, 1946, LOS: 0 ADMISSION DATE:  01/08/2020, CONSULTATION DATE:  01/08/20 REFERRING MD:  EDP, CHIEF COMPLAINT:  SHOB   Brief History   Cindy Burns is a 74 y.o female with a hx of breast cancer, COPD, MDD, and DM who presented to [facility] with SOB. She was found to have acute renal failure with a significant high anion gap metabolic acidosis and hyperkalemia. She was subsequently transferred to [facility] for HD and further evaluation/management.  History of present illness   Cindy Burns is a 74 y.o female with a hx of breast cancer, COPD, MDD, and DM who presented to [facility] with SOB. Patient reportly had 4 days of worsening SOB with her chronic cough. She called EMS because her symptoms had progressed. Fire department arrived to find the patient hypoxic and she was placed on NRB. By the time EMS arrived the patient was able to be transitioned to 3L/min [place].  transitoned to [place] .  Unable to tolerate bipap and subsequently intubated with significant metabolic acidosis. U/a positive .  Urine culture, blood culture sent.   Central line placed in left IJ.  Started on vanc, ceft.    Transferred to [place] with last abg 7.08/19/166/ bicarb 3.  Nephrology consulted.  Pending IHD for hyperkalemia, metabolic acidosis.    On admission found to be anemia with hb of 6.9 down from 9.0 at [place] prior to 4L resuscitation.  Also found to have negative urine ketones, positive betahydroxybutyrate.    Past Medical History   Past Medical History:  Diagnosis Date  . Cancer [facility]) 2005   Breast Cancer  . COPD (chronic obstructive pulmonary disease) ([place])   . Depression   . Diabetes mellitus without complication ([place])   . Hyperlipidemia    Significant Hospital Events   4/3 > Admitted for acute renal failure with hyperkalemia, placement of an HD cath   Consults:  Nephrology   Procedures:  4/3 > left IJ  placed at [place], intubation  Significant Diagnostic Tests:   CXR 4/3:  1. No acute cardiopulmonary disease. 2. Lung hyperexpansion consistent with COPD.  Micro Data:  SARs COVID > Negative 4/3 Blood cultures > Pending   Antimicrobials:  4/3 Ceftriaxone >> 4/3 Vancomycin >>  Interim history/subjective:  See above  Objective   Temperature (!) 95.7 F (35.4 C), temperature source Bladder, weight 52.9 kg.    Vent Mode: AC FiO2 (%):  [24 %] 24 % Set Rate:  [28 bmp] 28 bmp Vt Set:  [450 mL] 450 mL PEEP:  [5 cmH20] 5 cmH20   Intake/Output Summary (Last 24 hours) at 01/08/2020 2220 Last data filed at 01/08/2020 2200 Gross per 24 hour  Intake --  Output 150 ml  Net -150 ml   Filed Weights   01/08/20 2147  Weight: 52.9 kg    Examination: General: intubated sedated on fentanyl gtt HENT: perrla Lungs: cta b/l Cardiovascular: rrr no m/r/g Abdomen: soft nt, nd, bs+ Extremities: no le edema Neuro: non focal, sedated GU: foley in place  Resolved Hospital Problem list     Assessment & Plan:  74 year old history of breast cancer, copd, dm presents as transfer from [facility] ED for AKI, UTI, AG metabolic acidosis, hyperkalemia.    AG metabolic acidosis- dka, lactic acidosis. Given 4L at [place].  Lactic acidosis improved from 3.7 to 2.1.  Urine ketones negative. bhb +.  Started on insulin gtt -  will continue insulin gtt per DKA protocol - will continue fluids with bicarb at 150 cc per hour per renal - pending CRRT  Anemia- hb dropped from 9 to 6.8, ptt, inr slightly elevated, fibrinogen normal.   - will transfuse 1 unit prbc - will get cbc q6hours - will get iron, tibc, ferritin, folate , vit b12  UTI- u/a postive at [place].  ucx pending.  Started on vanc, ceftriaxone.  - will continue ceftriazone 1g q24 hours - pending ucx, bcx   Electrolytes- hyperkalemia resolved.  Developed hypocalcemia, hypomagnesemia.  - will give 2g mag sulfate - will give 2g calcium  chloride  - will check mag, renal function daily per renal   AKI - likely secondary to DKA, hypovolemia.  tlc left IJ placed in [place].  Switched to [place] over wire on 01/08/20.   - will hold further vanc per renal - will continue management of DKA per above  - will transfuse 1 unit of prbc for anemia - will start CRRT per renal   COPD- clear on exam  DM- hba1c 8.5 - will rx per DKA above     Best practice:  Diet: renal diet per NG Pain/Anxiety/Delirium protocol (if indicated): fentanyl gtt  VAP protocol (if indicated): per protocol DVT prophylaxis: held given symptomatic anemia of uncertain etiology  GI prophylaxis: pepcid Glucose control: dka protocol Mobility: bed rest  Code Status: full code Family Communication: ms bowles is patients only listed contact.  She is notified on patients progress.  We will need to clarify whether she can act as medical POA.   Disposition: ICU  Labs   CBC: Recent Labs  Lab 01/08/20 1412  WBC 18.4*  NEUTROABS 17.5*  HGB 9.0*  HCT 26.1*  MCV 83.1  PLT 185    Basic Metabolic Panel: Recent Labs  Lab 01/08/20 1412 01/08/20 1811  NA 126*  --   K 7.4*  --   CL 90*  --   CO2 <7*  --   GLUCOSE 225* 273*  BUN 225*  --   CREATININE 15.43*  --   CALCIUM 6.5*  --    GFR: Estimated Creatinine Clearance: 2.6 mL/min (A) (by C-G formula based on SCr of 15.43 mg/dL (H)). Recent Labs  Lab 01/08/20 1412 01/08/20 1811  WBC 18.4*  --   LATICACIDVEN 1.1 3.7*    Liver Function Tests: Recent Labs  Lab 01/08/20 1412  AST 17  ALT 26  ALKPHOS 120  BILITOT 0.9  PROT 8.2*  ALBUMIN 3.5   No results for input(s): LIPASE, AMYLASE in the last 168 hours. No results for input(s): AMMONIA in the last 168 hours.  ABG    Component Value Date/Time   PHART 7.04 (LL) 01/08/2020 1938   PCO2ART 24 (L) 01/08/2020 1938   PO2ART 129 (H) 01/08/2020 1938   HCO3 6.5 (L) 01/08/2020 1938   ACIDBASEDEF 22.7 (H) 01/08/2020 1938   O2SAT 97.2  01/08/2020 1938     Coagulation Profile: No results for input(s): INR, PROTIME in the last 168 hours.  Cardiac Enzymes: Recent Labs  Lab 01/08/20 1622  CKTOTAL 178    HbA1C: Hgb A1c MFr Bld  Date/Time Value Ref Range Status  07/29/2019 09:48 AM 6.8 (H) 4.6 - 6.5 % Final    Comment:    Glycemic Control Guidelines for People with Diabetes:Non Diabetic:  <6%Goal of Therapy: <7%Additional Action Suggested:  >8%   04/01/2019 08:33 AM 6.9 (H) 4.6 - 6.5 % Final    Comment:  Glycemic Control Guidelines for People with Diabetes:Non Diabetic:  <6%Goal of Therapy: <7%Additional Action Suggested:  >8%    CBG: Recent Labs  Lab 01/08/20 1812 01/08/20 2144  GLUCAP 239* 299*    Review of Systems:   12 point ROS preformed. All negative aside from those mentioned in the HPI.  Past Medical History  She,  has a past medical history of Cancer ([place]) (2005), COPD (chronic obstructive pulmonary disease) ([place]), Depression, Diabetes mellitus without complication ([place]), and Hyperlipidemia.   Surgical History    Past Surgical History:  Procedure Laterality Date  . BREAST SURGERY  2005  . CATARACT EXTRACTION    . CHOLECYSTECTOMY  2013     Social History   reports that she has been smoking cigarettes. She has a 40.00 pack-year smoking history. She has never used smokeless tobacco. She reports that she does not drink alcohol or use drugs.   Family History   Her family history includes Alzheimer's disease in her mother; Arthritis in her mother; COPD in her brother and brother; Dementia in her father.   Allergies No Known Allergies   Home Medications  Prior to Admission medications   Not on File    Critical Care time: 45 minutes

## 2020-01-08 NOTE — ED Provider Notes (Signed)
Medical screening examination/treatment/procedure(s) were conducted as a shared visit with non-physician practitioner(s) and myself.  I personally evaluated the patient during the encounter. Briefly, the patient is a 74 yo F here with weakness, altered mental status, failure to thrive with poor PO intake. On assumption of care, patient noted to be profoundly acidotic with acute renal failure, severe hyperkalemia, hypothermia, and possible sepsis. Will broaden out antibiotics, fluid resuscitate, and patient given multiple temporizing measures for her hyperkalemia, in addition to being started on bicarb drip for her severe AKI. Despite this, patient became increasingly altered and fatigued, which I suspect is 2/2 respiratory fatigue from compensating for her metabolic acidosis, complicated by a worsening lactic acidosis. Confirmed with patient that she is full code.   Patient intubated per PA note. I was present and supervised the procedure. I then placed a left IJ line given need for multiple access. Suspect she'll need R IJ or femoral HD cath, hence the placement on left. Otherwise, she was accepted to [facility] for emergent HD and admission.  .Critical Care Performed by: Duffy Bruce, MD Authorized by: Duffy Bruce, MD   Critical care provider statement:    Critical care time (minutes):  65   Critical care time was exclusive of:  Separately billable procedures and treating other patients and teaching time   Critical care was necessary to treat or prevent imminent or life-threatening deterioration of the following conditions:  Cardiac failure, circulatory failure, respiratory failure and dehydration   Critical care was time spent personally by me on the following activities:  Development of treatment plan with patient or surrogate, discussions with consultants, evaluation of patient's response to treatment, examination of patient, obtaining history from patient or surrogate, ordering and  performing treatments and interventions, ordering and review of laboratory studies, ordering and review of radiographic studies, pulse oximetry, re-evaluation of patient's condition and review of old charts   I assumed direction of critical care for this patient from another provider in my specialty: no   .1-3 Lead EKG Interpretation Performed by: Duffy Bruce, MD Authorized by: Duffy Bruce, MD     Interpretation: abnormal     ECG rate:  100-120   ECG rate assessment: tachycardic     Ectopy: none     Conduction: normal   Comments:     Indication: Hyperkalemia, respiratory failure, renal failure .Central Line  Date/Time: 01/09/2020 2:03 AM Performed by: Duffy Bruce, MD Authorized by: Duffy Bruce, MD   Consent:    Consent obtained:  Emergent situation   Risks discussed:  Arterial puncture, incorrect placement, infection, nerve damage, pneumothorax and bleeding Pre-procedure details:    Hand hygiene: Hand hygiene performed prior to insertion     Sterile barrier technique: All elements of maximal sterile technique followed     Skin preparation:  2% chlorhexidine   Skin preparation agent: Skin preparation agent completely dried prior to procedure   Sedation:    Sedation type:  Deep Anesthesia (see MAR for exact dosages):    Anesthesia method:  Local infiltration   Local anesthetic:  Lidocaine 1% w/o epi Procedure details:    Location:  L internal jugular   Site selection rationale:  U/S access, reserving R IJ and femorals for likely HD catheter access, low infection risk   Patient position:  Flat   Procedural supplies:  Triple lumen   Landmarks identified: yes     Ultrasound guidance: yes     Sterile ultrasound techniques: Sterile gel and sterile probe covers were used  Number of attempts:  1   Successful placement: yes   Post-procedure details:    Post-procedure:  Dressing applied   Assessment:  Free fluid flow, no pneumothorax on x-ray, placement verified by  x-ray and blood return through all ports   Patient tolerance of procedure:  Tolerated well, no immediate complications       Duffy Bruce, MD 01/09/20 [phone]

## 2020-01-08 NOTE — ED Notes (Signed)
[place] PA-C aware of potassium of 7.4

## 2020-01-08 NOTE — ED Notes (Signed)
Called [facility] for transfer to CCU spoke to [place]

## 2020-01-08 NOTE — Progress Notes (Signed)
CRITICAL VALUE ALERT  Critical Value:  Hemoglobin 6.8   Date & Time Notied:  01/08/20 10:45 PM   Provider Notified: Mickel Baas Gleason, PA at bedside  Orders Received/Actions taken: Acknowledged, awaiting orders.

## 2020-01-08 NOTE — Progress Notes (Signed)
CRITICAL VALUE ALERT  Critical Value:  Lactic acid 2.1  Date & Time Notied:  01/08/20 11:11 PM   Provider Notified: Mickel Baas Gleason, PA  Orders Received/Actions taken: Acknowledged

## 2020-01-08 NOTE — ED Notes (Signed)
Dr. Ellender Hose, Roderic Palau cuthriel PA-C, this writer, Gwenette Greet RN, Meagan RN, Larene Beach RT, Stacy RT at bedside prior to intubation. Patient advised of need for intubation by Chanda Busing. PA-C and Dr. Ellender Hose.

## 2020-01-08 NOTE — ED Triage Notes (Addendum)
Per EMS report, patient c/o increased shortness of breath and chills for 3-4 days. Patient has a history of COPD, but does not use O2 at home. Patient states she has not been around anyone with Covid. Per EMS report, patient met them outside. EMT went inside and there were 4 dogs with feces on the floor. Patient arrived with strong odor on person and clothes. Patient's feet were cold to the touch, but toes were not cyanotic. Patient has labored breathing, tachypnea.

## 2020-01-08 NOTE — Procedures (Signed)
Hemodialysis Catheter Insertion Procedure Note Cindy Burns [id number] 21-Aug-1946  Procedure: Insertion of Hemodialysis Catheter Indications: Dialysis Access   Procedure Details Consent: no family available, consent obtained from friend Time Out: Verified patient identification, verified procedure, site/side was marked, verified correct patient position, special equipment/implants available, medications/allergies/relevent history reviewed, required imaging and test results available.  Performed  Maximum sterile technique was used including antiseptics, cap, gloves, gown, hand hygiene, mask and sheet. Skin prep: Chlorhexidine; local anesthetic administered Double lumen hemodialysis catheter was inserted into left internal jugular vein using the Seldinger technique.  Evaluation Blood flow good Complications: No apparent complications Patient did tolerate procedure well. Chest X-ray ordered to verify placement.  CXR: pending.   Cindy Burns Cindy Burns 01/08/2020

## 2020-01-08 NOTE — ED Notes (Signed)
Patient is lying against railing. Patient is alert, moves easily. Patient 's RR is 22, 100% on 2L.  Patient is still labored.

## 2020-01-08 NOTE — ED Notes (Signed)
Chanda Busing PA-C aware of ABG results.

## 2020-01-08 NOTE — Consult Note (Signed)
PHARMACY -  BRIEF ANTIBIOTIC NOTE   Pharmacy has received consult(s) for vancomycin from an ED provider. Patient also received ceftriaxone x 1. The patient's profile has been reviewed for ht/wt/allergies/indication/available labs. No known drug allergies per chart.  Patient with severe AKI with Scr 15.43 from baseline ~0.9.   One time order(s) placed for -Vancomycin 1 g x 1  Further antibiotics/pharmacy consults should be ordered by admitting physician if indicated.                       Thank you, [place] Resident 01/08/2020  5:08 PM

## 2020-01-08 NOTE — Progress Notes (Signed)
[facility]Link Physician-Brief Progress Note Patient Name: Cindy Burns DOB: 26-Nov-1945 MRN: [id number]   Date of Service  01/08/2020  HPI/Events of Note  35 F COPD (FEV1 55%), DM presented with SOB and chronic cough found to be in severe metabolic acidosis with K 7.4 and creatinine 15. CO2 <7, beta hydroxy 3.5, lactic acid 3.7  eICU Interventions  High AG met acid combination of renal failure, lactic acidosis and DKA plan for CRRT, insulin drip and fluids     Intervention Category Major Interventions: Acid-Base disturbance - evaluation and management;Electrolyte abnormality - evaluation and management;Respiratory failure - evaluation and management Evaluation Type: New Patient Evaluation  Judd Lien 01/08/2020, 10:01 PM

## 2020-01-08 NOTE — ED Notes (Signed)
Patient is restless, sitting up for c/o shortness of breath. Patient will follow commands. Patient won't leave [facility] on consistently. Patient can be redirected and will converse with caregiver.

## 2020-01-08 NOTE — ED Notes (Signed)
Report given to Archivist at [address] [place] [facility] at [facility].

## 2020-01-09 ENCOUNTER — Inpatient Hospital Stay (HOSPITAL_COMMUNITY): Payer: Medicare Other

## 2020-01-09 DIAGNOSIS — N179 Acute kidney failure, unspecified: Secondary | ICD-10-CM | POA: Diagnosis not present

## 2020-01-09 DIAGNOSIS — L8990 Pressure ulcer of unspecified site, unspecified stage: Secondary | ICD-10-CM | POA: Diagnosis present

## 2020-01-09 LAB — MRSA PCR SCREENING: MRSA by PCR: NEGATIVE

## 2020-01-09 LAB — FERRITIN: Ferritin: 702 ng/mL — ABNORMAL HIGH (ref 11–307)

## 2020-01-09 LAB — HEMOGLOBIN AND HEMATOCRIT, BLOOD
HCT: 22.9 % — ABNORMAL LOW (ref 36.0–46.0)
HCT: 19.5 % — ABNORMAL LOW (ref 36.0–46.0)
HCT: 26.3 % — ABNORMAL LOW (ref 36.0–46.0)
Hemoglobin: 8 g/dL — ABNORMAL LOW (ref 12.0–15.0)
Hemoglobin: 6.7 g/dL — CL (ref 12.0–15.0)
Hemoglobin: 9.4 g/dL — ABNORMAL LOW (ref 12.0–15.0)

## 2020-01-09 LAB — GLUCOSE, CAPILLARY
Glucose-Capillary: 102 mg/dL — ABNORMAL HIGH (ref 70–99)
Glucose-Capillary: 102 mg/dL — ABNORMAL HIGH (ref 70–99)
Glucose-Capillary: 106 mg/dL — ABNORMAL HIGH (ref 70–99)
Glucose-Capillary: 108 mg/dL — ABNORMAL HIGH (ref 70–99)
Glucose-Capillary: 114 mg/dL — ABNORMAL HIGH (ref 70–99)
Glucose-Capillary: 117 mg/dL — ABNORMAL HIGH (ref 70–99)
Glucose-Capillary: 118 mg/dL — ABNORMAL HIGH (ref 70–99)
Glucose-Capillary: 121 mg/dL — ABNORMAL HIGH (ref 70–99)
Glucose-Capillary: 121 mg/dL — ABNORMAL HIGH (ref 70–99)
Glucose-Capillary: 121 mg/dL — ABNORMAL HIGH (ref 70–99)
Glucose-Capillary: 122 mg/dL — ABNORMAL HIGH (ref 70–99)
Glucose-Capillary: 130 mg/dL — ABNORMAL HIGH (ref 70–99)
Glucose-Capillary: 143 mg/dL — ABNORMAL HIGH (ref 70–99)
Glucose-Capillary: 158 mg/dL — ABNORMAL HIGH (ref 70–99)
Glucose-Capillary: 168 mg/dL — ABNORMAL HIGH (ref 70–99)
Glucose-Capillary: 200 mg/dL — ABNORMAL HIGH (ref 70–99)
Glucose-Capillary: 259 mg/dL — ABNORMAL HIGH (ref 70–99)
Glucose-Capillary: 291 mg/dL — ABNORMAL HIGH (ref 70–99)
Glucose-Capillary: 99 mg/dL (ref 70–99)

## 2020-01-09 LAB — CBC
HCT: 20.5 % — ABNORMAL LOW (ref 36.0–46.0)
HCT: 22.8 % — ABNORMAL LOW (ref 36.0–46.0)
Hemoglobin: 6.9 g/dL — CL (ref 12.0–15.0)
Hemoglobin: 8 g/dL — ABNORMAL LOW (ref 12.0–15.0)
MCH: 28.6 pg (ref 26.0–34.0)
MCH: 28.4 pg (ref 26.0–34.0)
MCHC: 33.7 g/dL (ref 30.0–36.0)
MCHC: 35.1 g/dL (ref 30.0–36.0)
MCV: 85.1 fL (ref 80.0–100.0)
MCV: 80.9 fL (ref 80.0–100.0)
Platelets: 239 10*3/uL (ref 150–400)
Platelets: 156 10*3/uL (ref 150–400)
RBC: 2.41 MIL/uL — ABNORMAL LOW (ref 3.87–5.11)
RBC: 2.82 MIL/uL — ABNORMAL LOW (ref 3.87–5.11)
RDW: 14.5 % (ref 11.5–15.5)
RDW: 13.9 % (ref 11.5–15.5)
WBC: 14.6 10*3/uL — ABNORMAL HIGH (ref 4.0–10.5)
WBC: 12.1 10*3/uL — ABNORMAL HIGH (ref 4.0–10.5)
nRBC: 0 % (ref 0.0–0.2)
nRBC: 0 % (ref 0.0–0.2)

## 2020-01-09 LAB — RENAL FUNCTION PANEL
Albumin: 2.1 g/dL — ABNORMAL LOW (ref 3.5–5.0)
Albumin: 2.3 g/dL — ABNORMAL LOW (ref 3.5–5.0)
Albumin: 2.2 g/dL — ABNORMAL LOW (ref 3.5–5.0)
Anion gap: 17 — ABNORMAL HIGH (ref 5–15)
Anion gap: 18 — ABNORMAL HIGH (ref 5–15)
Anion gap: 11 (ref 5–15)
BUN: 113 mg/dL — ABNORMAL HIGH (ref 8–23)
BUN: 93 mg/dL — ABNORMAL HIGH (ref 8–23)
BUN: 53 mg/dL — ABNORMAL HIGH (ref 8–23)
CO2: 18 mmol/L — ABNORMAL LOW (ref 22–32)
CO2: 18 mmol/L — ABNORMAL LOW (ref 22–32)
CO2: 23 mmol/L (ref 22–32)
Calcium: 6.4 mg/dL — CL (ref 8.9–10.3)
Calcium: 6.8 mg/dL — ABNORMAL LOW (ref 8.9–10.3)
Calcium: 6.9 mg/dL — ABNORMAL LOW (ref 8.9–10.3)
Chloride: 100 mmol/L (ref 98–111)
Chloride: 99 mmol/L (ref 98–111)
Chloride: 101 mmol/L (ref 98–111)
Creatinine, Ser: 6.76 mg/dL — ABNORMAL HIGH (ref 0.44–1.00)
Creatinine, Ser: 5.88 mg/dL — ABNORMAL HIGH (ref 0.44–1.00)
Creatinine, Ser: 4.08 mg/dL — ABNORMAL HIGH (ref 0.44–1.00)
GFR calc Af Amer: 7 mL/min — ABNORMAL LOW (ref >60)
GFR calc Af Amer: 8 mL/min — ABNORMAL LOW (ref >60)
GFR calc Af Amer: 12 mL/min — ABNORMAL LOW (ref >60)
GFR calc non Af Amer: 6 mL/min — ABNORMAL LOW (ref >60)
GFR calc non Af Amer: 7 mL/min — ABNORMAL LOW (ref >60)
GFR calc non Af Amer: 10 mL/min — ABNORMAL LOW (ref >60)
Glucose, Bld: 308 mg/dL — ABNORMAL HIGH (ref 70–99)
Glucose, Bld: 110 mg/dL — ABNORMAL HIGH (ref 70–99)
Glucose, Bld: 118 mg/dL — ABNORMAL HIGH (ref 70–99)
Phosphorus: 5.3 mg/dL — ABNORMAL HIGH (ref 2.5–4.6)
Phosphorus: 4.9 mg/dL — ABNORMAL HIGH (ref 2.5–4.6)
Phosphorus: 3.3 mg/dL (ref 2.5–4.6)
Potassium: 3.5 mmol/L (ref 3.5–5.1)
Potassium: 3.6 mmol/L (ref 3.5–5.1)
Potassium: 3.7 mmol/L (ref 3.5–5.1)
Sodium: 135 mmol/L (ref 135–145)
Sodium: 135 mmol/L (ref 135–145)
Sodium: 135 mmol/L (ref 135–145)

## 2020-01-09 LAB — APTT
aPTT: 31 seconds (ref 24–36)
aPTT: 38 seconds — ABNORMAL HIGH (ref 24–36)

## 2020-01-09 LAB — PREPARE RBC (CROSSMATCH)

## 2020-01-09 LAB — RETICULOCYTES
Immature Retic Fract: 16.1 % — ABNORMAL HIGH (ref 2.3–15.9)
RBC.: 2.42 MIL/uL — ABNORMAL LOW (ref 3.87–5.11)
Retic Count, Absolute: 65.8 10*3/uL (ref 19.0–186.0)
Retic Ct Pct: 2.7 % (ref 0.4–3.1)

## 2020-01-09 LAB — BETA-HYDROXYBUTYRIC ACID
Beta-Hydroxybutyric Acid: 0.59 mmol/L — ABNORMAL HIGH (ref 0.05–0.27)
Beta-Hydroxybutyric Acid: 3.5 mmol/L — ABNORMAL HIGH (ref 0.05–0.27)

## 2020-01-09 LAB — KETONES, URINE: Ketones, ur: 5 mg/dL — AB

## 2020-01-09 LAB — VITAMIN B12: Vitamin B-12: 510 pg/mL (ref 180–914)

## 2020-01-09 LAB — PROTIME-INR
INR: 1.4 — ABNORMAL HIGH (ref 0.8–1.2)
Prothrombin Time: 17 seconds — ABNORMAL HIGH (ref 11.4–15.2)

## 2020-01-09 LAB — LACTIC ACID, PLASMA: Lactic Acid, Venous: 1.2 mmol/L (ref 0.5–1.9)

## 2020-01-09 LAB — FIBRINOGEN: Fibrinogen: 587 mg/dL — ABNORMAL HIGH (ref 210–475)

## 2020-01-09 LAB — IRON AND TIBC
Iron: 143 ug/dL (ref 28–170)
Saturation Ratios: 83 % — ABNORMAL HIGH (ref 10.4–31.8)
TIBC: 172 ug/dL — ABNORMAL LOW (ref 250–450)
UIBC: 29 ug/dL

## 2020-01-09 LAB — HEMOGLOBIN A1C
Hgb A1c MFr Bld: 8.5 % — ABNORMAL HIGH (ref 4.8–5.6)
Mean Plasma Glucose: 197.25 mg/dL

## 2020-01-09 LAB — SODIUM, URINE, RANDOM: Sodium, Ur: 101 mmol/L

## 2020-01-09 LAB — CREATININE, URINE, RANDOM: Creatinine, Urine: 33.82 mg/dL

## 2020-01-09 LAB — PHOSPHORUS: Phosphorus: >30 mg/dL — ABNORMAL HIGH (ref 2.5–4.6)

## 2020-01-09 LAB — FOLATE: Folate: 10.1 ng/mL (ref >5.9)

## 2020-01-09 LAB — ABO/RH: ABO/RH(D): A POS

## 2020-01-09 MED ORDER — CHLORHEXIDINE GLUCONATE 0.12 % MT SOLN
OROMUCOSAL | Status: AC
  Filled 2020-01-09: qty 15

## 2020-01-09 MED ORDER — FENTANYL 2500MCG IN NS 250ML (10MCG/ML) PREMIX INFUSION
25.0000 ug/h | INTRAVENOUS | Status: DC
  Administered 2020-01-09: 200 ug/h via INTRAVENOUS
  Administered 2020-01-09: 50 ug/h via INTRAVENOUS
  Administered 2020-01-10: 150 ug/h via INTRAVENOUS
  Administered 2020-01-11: 200 ug/h via INTRAVENOUS
  Filled 2020-01-09 (×3): qty 250

## 2020-01-09 MED ORDER — PRISMASOL BGK 4/2.5 32-4-2.5 MEQ/L REPLACEMENT SOLN
Status: DC
  Filled 2020-01-09 (×3): qty 5000

## 2020-01-09 MED ORDER — INSULIN DETEMIR 100 UNIT/ML ~~LOC~~ SOLN
5.0000 [IU] | Freq: Two times a day (BID) | SUBCUTANEOUS | Status: DC
  Administered 2020-01-09 – 2020-01-10 (×2): 5 [IU] via SUBCUTANEOUS
  Filled 2020-01-09 (×3): qty 0.05

## 2020-01-09 MED ORDER — MIDAZOLAM 50MG/50ML (1MG/ML) PREMIX INFUSION
0.5000 mg/h | INTRAVENOUS | Status: DC
  Administered 2020-01-09: 0.5 mg/h via INTRAVENOUS
  Filled 2020-01-09: qty 50

## 2020-01-09 MED ORDER — SODIUM CHLORIDE 0.9% IV SOLUTION: Freq: Once | INTRAVENOUS | Status: AC

## 2020-01-09 MED ORDER — MIDAZOLAM HCL 2 MG/2ML IJ SOLN
INTRAMUSCULAR | Status: AC
  Filled 2020-01-09: qty 2

## 2020-01-09 MED ORDER — DEXTROSE 50 % IV SOLN
0.0000 mL | INTRAVENOUS | Status: DC | PRN
  Filled 2020-01-09: qty 50

## 2020-01-09 MED ORDER — CHLORHEXIDINE GLUCONATE CLOTH 2 % EX PADS
6.0000 | MEDICATED_PAD | Freq: Every day | CUTANEOUS | Status: DC
  Administered 2020-01-10 – 2020-01-24 (×15): 6 via TOPICAL

## 2020-01-09 MED ORDER — INSULIN ASPART 100 UNIT/ML ~~LOC~~ SOLN
1.0000 [IU] | SUBCUTANEOUS | Status: DC
  Administered 2020-01-10: 1 [IU] via SUBCUTANEOUS

## 2020-01-09 MED ORDER — CHLORHEXIDINE GLUCONATE CLOTH 2 % EX PADS
6.0000 | MEDICATED_PAD | Freq: Every day | CUTANEOUS | Status: DC
  Administered 2020-01-09: 6 via TOPICAL

## 2020-01-09 MED ORDER — DEXTROSE-NACL 5-0.45 % IV SOLN: INTRAVENOUS | Status: DC

## 2020-01-09 MED ORDER — SODIUM CHLORIDE 0.9 % IV SOLN: INTRAVENOUS | Status: DC

## 2020-01-09 MED ORDER — POTASSIUM CHLORIDE 10 MEQ/100ML IV SOLN
10.0000 meq | INTRAVENOUS | Status: AC
  Filled 2020-01-09: qty 100

## 2020-01-09 MED ORDER — MIDAZOLAM HCL 2 MG/2ML IJ SOLN
2.0000 mg | INTRAMUSCULAR | Status: DC | PRN
  Administered 2020-01-09 (×2): 2 mg via INTRAVENOUS

## 2020-01-09 MED ORDER — INSULIN REGULAR(HUMAN) IN NACL 100-0.9 UT/100ML-% IV SOLN
INTRAVENOUS | Status: DC
  Administered 2020-01-09: 8 [IU]/h via INTRAVENOUS
  Filled 2020-01-09: qty 100

## 2020-01-09 MED ORDER — SODIUM CHLORIDE 0.9 % IV SOLN
INTRAVENOUS | Status: DC | PRN
  Administered 2020-01-09 – 2020-01-11 (×3): 250 mL via INTRAVENOUS
  Administered 2020-01-12: 500 mL via INTRAVENOUS

## 2020-01-09 NOTE — Progress Notes (Signed)
NAME:  Cindy Burns, MRN:  [id number], DOB:  15-Aug-1946, LOS: 1 ADMISSION DATE:  01/08/2020, CONSULTATION DATE:  01/08/20 REFERRING MD:  EDP, CHIEF COMPLAINT:  SHOB   Brief History   Cindy Burns is a 74 y.o female with a hx of breast cancer, COPD, MDD, and DM who presented to [facility] with SOB. She was found to have acute renal failure with a significant high anion gap metabolic acidosis and hyperkalemia. She was subsequently transferred to [facility] for HD and further evaluation/management.  History of present illness   Cindy Burns is a 74 y.o female with a hx of breast cancer, COPD, MDD, and DM who presented to [facility] with SOB. Patient reportly had 4 days of worsening SOB with her chronic cough. She called EMS because her symptoms had progressed. Fire department arrived to find the patient hypoxic and she was placed on NRB. By the time EMS arrived the patient was able to be transitioned to 3L/min [place]. On arrival to ED, requiring Bipap.  Unable to tolerate bipap and subsequently intubated with significant metabolic acidosis. U/a positive .  Urine culture, blood culture sent. Central line placed in left IJ.  Started on vanc, ceft.    Transferred to  with last abg 7.08/19/166/ bicarb 3.  Nephrology consulted.  Pending IHD for hyperkalemia, metabolic acidosis.    On admission found to be anemia with hb of 6.9 down from 9.0 at [place] prior to 4L resuscitation.  Also found to have negative urine ketones, positive betahydroxybutyrate.    Past Medical History   Past Medical History:  Diagnosis Date  . Cancer [facility]) 2005   Breast Cancer  . COPD (chronic obstructive pulmonary disease) ([place])   . Depression   . Diabetes mellitus without complication ([place])   . Hyperlipidemia    Significant Hospital Events   4/3 > Admitted for acute renal failure with hyperkalemia, placement of an HD cath   Consults:  Nephrology   Procedures:  4/3 >  left IJ placed at [place], intubation  Significant Diagnostic Tests:   CXR 4/3:  1. No acute cardiopulmonary disease. 2. Lung hyperexpansion consistent with COPD.  Renal Ultrasound 4/4:  1. No acute findings.  No hydronephrosis. 2. Relative small right kidney with borderline increased renal parenchymal echogenicity consistent medical renal disease. 3. Left renal cyst.  Micro Data:  SARs COVID > Negative 4/3 Blood cultures > Pending  Urine culture > Pending   Antimicrobials:  Ceftriaxone 4/3 >> Vancomycin 4/3-4/4  Interim history/subjective:  Overnight, patient had [place] placed and was initiated on CRRT. No other major events.   Obtained history from niece, Cindy Burns, today. She states that Cindy Burns lives on her property but they are not in contact any longer due to disagreements. She states that she is Ms. [place] closest living blood relative though. Patient does have a daughter but she "did not raise her ," and so no one in the family knows where the daughter is. As far as Cindy Burns knows, patient performs all her own ADLs, including grocery shopping and cooking. As far as PMHx, she knows the patient mentioned she was diabetic 5 years ago. In addition, she noted that patient wore oxygen at home. She has a long history of tobacco use but no recent alcohol or drug use as far as Cindy Burns knows.   Objective   Blood pressure (!) 144/72, pulse (!) 103, temperature 97.7 F (36.5 C), resp. rate 19, weight 52.9  kg, SpO2 100 %.    Vent Mode: PRVC FiO2 (%):  [24 %-40 %] 40 % Set Rate:  [28 bmp-30 bmp] 30 bmp Vt Set:  [400 mL-450 mL] 400 mL PEEP:  [5 cmH20] 5 cmH20 Plateau Pressure:  [13 cmH20-19 cmH20] 19 cmH20   Intake/Output Summary (Last 24 hours) at 01/09/2020 0828 Last data filed at 01/09/2020 0806 Gross per 24 hour  Intake 1546.45 ml  Output 615 ml  Net 931.45 ml   Filed Weights   01/08/20 2147 01/09/20 0500  Weight: 52.9 kg 52.9 kg   Physical Exam Vitals and  nursing note reviewed.  Constitutional:      Appearance: She is ill-appearing.     Interventions: She is sedated and intubated.  Cardiovascular:     Rate and Rhythm: Normal rate and regular rhythm.     Heart sounds: No murmur.  Pulmonary:     Effort: Pulmonary effort is normal. No respiratory distress. She is intubated.     Breath sounds: No wheezing or rales.  Abdominal:     General: Bowel sounds are normal. There is no distension.     Palpations: Abdomen is soft.     Tenderness: There is no abdominal tenderness. There is no guarding.  Skin:    General: Skin is warm and dry.     Coloration: Skin is not jaundiced.     Findings: No bruising or lesion.  Neurological:     Comments: Sedated; does not respond to noxious stimuli    Resolved Hospital Problem list   None  Assessment & Plan:   Ketoacidosis , likely combination of diabetic and starvation Likely in the setting of medication non-compliance with poor social support. On arrival there was not evidence of AMS or acute encephalopathy that would explain medication noncompliance though. - Endo Tool for insulin management. BG on the lower end this AM at 120. Given she continues to be in DKA, will increased D5 and continue insulin gtt.  - Trend beta-hydroxy  - Continue Bicarb at 100 cc/hr via CRRT  Acute Respiratory Failure Patient presented with SOB and required Bipap on arrival to [facility] ED. Due to inability to tolerate Bipap, she was subsequently intubated for airway protection. Likely secondary to severe metabolic acidosis.  - Continue PRVC, wean as tolerated  Acute Renal Failure  Normal creatinine last noted in October 2020. Differential includes hypovolemia in light of severe DKA. She did have moderate hemoglobin and 100 protein on UA yesterday. She is not edematous on examination, leaning away from nephrotic syndrome.  Renal ultrasound shows right kidney with changes consistent with chronic renal disease, but no  obstruction or hydronephrosis to explain acute failure.  - Nephrology following; we appreciate their recommendations  - CRRT initiated   Acute Normocytic Anemia Initial hemoglobin of 9.0, however likely hemo-concentrated. Upon fluid resuscitation, hemoglobin dropped to 6.8. Iron panel does not show evidence of iron deficiency; folate and B12 normal. This AM, it is 6.5. 1 unit of pRBCs have been transfused. Initially responded well, however repeat shows decrease to 6.7. No evidence of bleeding on examination. Will continue to transfuse PRN and monitor closely.  - Post-transfusion CBC - CBC BID  Urinary Tract Infection  Urinalysis at [place] showed many bacteria with large leukocytes. She was started on Vancomycin and Ceftriaxone. Vancomycin discontinued as MRSA unlikely and patient has severe AKI.  - Continue ceftriaxone  - Follow urine culture   Hypocalcemia  On arrival, calcium of 5.6. She received 1 g of CaCl. Improved on  repeat. Likely related to acute renal failure - Monitor daily, replete PRN  Hypermagnesemia  Patient received 2 g of Mg sulfate after initial labs indicated a low Mag of 1.2  - Monitor daily, replete PRN  Hyperphosphatemia  Continue to monitor as patient has been initiated on CRRT.   COPD - No wheezing noted on examination  - Duonebs PRN if she should develop wheezing   Best practice:  Diet: NPO Pain/Anxiety/Delirium protocol (if indicated): fentanyl and midazolam gtt VAP protocol (if indicated): per protocol DVT prophylaxis: Continue to hold in light of assumed active bleed  GI prophylaxis: Protonix  Glucose control: DKA protocol Mobility: bed rest  Code Status:Full Family Communication: Niece updated on 4/4 by telephone  Disposition: ICU  Labs   CBC: Recent Labs  Lab 01/08/20 1412 01/08/20 2220 01/08/20 2241 01/08/20 2356  WBC 18.4* 13.4* 14.6*  --   NEUTROABS 17.5*  --   --   --   HGB 9.0* 6.8* 6.9* 6.5*  HCT 26.1* 20.5* 20.5* 19.0*  MCV  83.1 86.1 85.1  --   PLT 330 216 239  --     Basic Metabolic Panel: Recent Labs  Lab 01/08/20 1412 01/08/20 1811 01/08/20 2210 01/08/20 2356  NA 126*  --  132* 131*  K 7.4*  --  4.3 4.5  CL 90*  --  98  --   CO2 <7*  --  8*  --   GLUCOSE 225* 273* 323*  --   BUN 225*  --  196*  --   CREATININE 15.43*  --  13.36*  --   CALCIUM 6.5*  --  5.6*  --   MG  --   --  1.2*  --   PHOS  --   --  >12.0*  --    GFR: Estimated Creatinine Clearance: 3.1 mL/min (A) (by C-G formula based on SCr of 13.36 mg/dL (H)). Recent Labs  Lab 01/08/20 1412 01/08/20 1811 01/08/20 2220 01/08/20 2241 01/09/20 0247  WBC 18.4*  --  13.4* 14.6*  --   LATICACIDVEN 1.1 3.7* 2.1*  --  1.2    Liver Function Tests: Recent Labs  Lab 01/08/20 1412 01/08/20 2210  AST 17 18  ALT 26 21  ALKPHOS 120 87  BILITOT 0.9 0.8  PROT 8.2* 5.3*  ALBUMIN 3.5 2.1*   No results for input(s): LIPASE, AMYLASE in the last 168 hours. No results for input(s): AMMONIA in the last 168 hours.  ABG    Component Value Date/Time   PHART 7.114 (LL) 01/08/2020 2356   PCO2ART 29.2 (L) 01/08/2020 2356   PO2ART 177.0 (H) 01/08/2020 2356   HCO3 9.5 (L) 01/08/2020 2356   TCO2 10 (L) 01/08/2020 2356   ACIDBASEDEF 18.0 (H) 01/08/2020 2356   O2SAT 99.0 01/08/2020 2356     Coagulation Profile: Recent Labs  Lab 01/08/20 2345  INR 1.4*    Cardiac Enzymes: Recent Labs  Lab 01/08/20 1622  CKTOTAL 178    HbA1C: Hgb A1c MFr Bld  Date/Time Value Ref Range Status  01/08/2020 11:45 PM 8.5 (H) 4.8 - 5.6 % Final    Comment:    (NOTE) Pre diabetes:          5.7%-6.4% Diabetes:              >6.4% Glycemic control for   <7.0% adults with diabetes   07/29/2019 09:48 AM 6.8 (H) 4.6 - 6.5 % Final    Comment:    Glycemic Control Guidelines for People  with Diabetes:Non Diabetic:  <6%Goal of Therapy: <7%Additional Action Suggested:  >8%    CBG: Recent Labs  Lab 01/09/20 0351 01/09/20 0515 01/09/20 0617 01/09/20 0656  01/09/20 0801  GLUCAP 259* 200* 168* 158* 143*    Review of Systems:   Unable to obtain  Past Medical History  She,  has a past medical history of Cancer ([place]) (2005), COPD (chronic obstructive pulmonary disease) ([place]), Depression, Diabetes mellitus without complication ([place]), and Hyperlipidemia.   Surgical History    Past Surgical History:  Procedure Laterality Date  . BREAST SURGERY  2005  . CATARACT EXTRACTION    . CHOLECYSTECTOMY  2013     Social History   reports that she has been smoking cigarettes. She has a 40.00 pack-year smoking history. She has never used smokeless tobacco. She reports that she does not drink alcohol or use drugs.   Family History   Her family history includes Alzheimer's disease in her mother; Arthritis in her mother; COPD in her brother and brother; Dementia in her father.   Allergies No Known Allergies   Medications Prior to Admission: - Ezetimibe 10 mg daily QD - Gabapentin 800 mg at bedtime  - Glipizide 10 mg QD - Januvia 100 mg QD - Losartan 25 mg QD - Rosuvastatin 20 mg QD  - Symbicort - Venlafaxine 65 mg QD - Albuterol    Dr. Jose Persia Internal Medicine PGY-1  Pager: [phone] 01/09/2020, 8:28 AM

## 2020-01-09 NOTE — Progress Notes (Signed)
45 ml of versed gtt (from [facility]) wasted in med waste bin with Lucile Crater, RN.

## 2020-01-09 NOTE — Plan of Care (Signed)
  Problem: Education: Goal: Knowledge of General Education information will improve Description: Including pain rating scale, medication(s)/side effects and non-pharmacologic comfort measures Outcome: Not Progressing   Problem: Health Behavior/Discharge Planning: Goal: Ability to manage health-related needs will improve Outcome: Not Progressing   Problem: Clinical Measurements: Goal: Ability to maintain clinical measurements within normal limits will improve Outcome: Progressing Goal: Will remain free from infection Outcome: Progressing Goal: Diagnostic test results will improve Outcome: Progressing Goal: Respiratory complications will improve Outcome: Progressing Goal: Cardiovascular complication will be avoided Outcome: Progressing   Problem: Activity: Goal: Risk for activity intolerance will decrease Outcome: Progressing   Problem: Nutrition: Goal: Adequate nutrition will be maintained Outcome: Not Progressing   Problem: Coping: Goal: Level of anxiety will decrease Outcome: Not Progressing   Problem: Elimination: Goal: Will not experience complications related to bowel motility Outcome: Progressing Goal: Will not experience complications related to urinary retention Outcome: Not Applicable Note: Foley in place    Problem: Safety: Goal: Ability to remain free from injury will improve Outcome: Progressing   Problem: Skin Integrity: Goal: Risk for impaired skin integrity will decrease Outcome: Progressing Note: Stage 2 to coccyx   Problem: Fluid Volume: Goal: Hemodynamic stability will improve Outcome: Progressing   Problem: Clinical Measurements: Goal: Diagnostic test results will improve Outcome: Progressing Goal: Signs and symptoms of infection will decrease Outcome: Progressing   Problem: Respiratory: Goal: Ability to maintain adequate ventilation will improve Outcome: Progressing

## 2020-01-09 NOTE — Progress Notes (Signed)
Unable to obtain blood consent d/t no next of kin identified and patient is obtunded. Attending aware.

## 2020-01-09 NOTE — Progress Notes (Signed)
Inpatient Diabetes Program Recommendations  AACE/ADA: New Consensus Statement on Inpatient Glycemic Control (2015)  Target Ranges:  Prepandial:   less than 140 mg/dL      Peak postprandial:   less than 180 mg/dL (1-2 hours)      Critically ill patients:  140 - 180 mg/dL   Lab Results  Component Value Date   GLUCAP 143 (H) 01/09/2020   HGBA1C 8.5 (H) 01/08/2020    Review of Glycemic Control Results for Cindy Burns, Cindy Burns (MRN [id number]) as of 01/09/2020 09:12  Ref. Range 01/09/2020 06:17 01/09/2020 06:56 01/09/2020 08:01  Glucose-Capillary Latest Ref Range: 70 - 99 mg/dL 168 (H) 158 (H) 143 (H)   Diabetes history: Type 2 DM Outpatient Diabetes medications: none Current orders for Inpatient glycemic control: IV insulin Lantus 5 units QHS (prior to starting Endotool) Solumedrol 125 mg x1  Inpatient Diabetes Program Recommendations:    Noted consult, labs and current plan. In agreement to continue with Iv insulin until acidosis clears.   Consider adding BMP Q4H in addition to current plan.   Will continue to follow.   Thanks, Bronson Curb, MSN, RNC-OB Diabetes Coordinator [phone] (8a-5p)

## 2020-01-09 NOTE — Progress Notes (Signed)
eLink Physician-Brief Progress Note Patient Name: Cindy Burns DOB: 1945-11-23 MRN: [id number]   Date of Service  01/09/2020  HPI/Events of Note  Glucose 100s Corrected AG 15  eICU Interventions  Transitioned to Levemir 5 units as per protocol     Intervention Category Major Interventions: Hyperglycemia - active titration of insulin therapy  Judd Lien 01/09/2020, 10:34 PM

## 2020-01-09 NOTE — Progress Notes (Addendum)
[facility] Progress Note  Subjective: 450 cc UOP yest. BP's stable.  FiO2 40%. +800 cc yest (not including 4L bolus at [facility] pta).  BUN down 113 and creat down 6.7  Vitals:   01/09/20 1000 01/09/20 1100 01/09/20 1111 01/09/20 1200  BP: (!) 152/84 (!) 161/71 (!) 147/72 138/69  Pulse: (!) 107 (!) 102 99 94  Resp: 12 (!) 24 18 (!) 30  Temp: 98.4 F (36.9 C) (!) 97.2 F (36.2 C) (!) 97 F (36.1 C) (!) 96.4 F (35.8 C)  TempSrc:      SpO2: 100% 100%  100%  Weight:        Exam: Gen on vent, ETT in place, NG in place Chest clear bilat to bases RRR no MRG Abd soft ntnd no mass or ascites +bs GU foley draining clear urine small amts MS no joint effusions or deformity Ext no LE edema, some mottling of feet bilat Neuro is sedated on the vent    Home meds:  - none listed    CXR - no active disease   UA turbid mod Hb, large LE, prot 100, many bact/ 11-20 rbc/ >50 wbc   UNa 101, UCr 34   Urine sediment (undersigned, 4/4) -- > 50 wbc/ hpf, 5-10 rbc/ hpf, no casts    Baseline creat 0.92 Oct 2020  Assessment/ Plan: 1. Renal failure - baseline creat 0.92 in 07/2019. Unclear cause of severe AKI.  Urine sediment not helpful, no casts. Poss UTI. Dehydration very likely contributing factor. No home meds listed. Making urine here. Will keep even for today. Severely azotemic on presentation, improving w/ CRRT, will cont for now. 2. Met acidosis - much better, dc IV bicarb gtt.  3. Hyperkalemia - resolved w/ RRT  4. Hypothermia/ ^^WBC - getting IV abx, blood/ urine cx's pending. Would rec dc Vanc asap if cx's allow.  Not requiring pressor support. WBC down and temps may be improving.  5. Hypoxemic resp failure - on vent. CXR w/o acute changes.  6. Tobacco/ COPD 7. Hx breast Ca - in 2005 8. H/o DM - no meds on home list       Kelly Splinter 01/09/2020, 12:06 PM   Recent Labs  Lab 01/08/20 2210 01/08/20 2220 01/08/20 2356 01/09/20 0940  K 4.3   < > 4.5 3.5  BUN  196*  --   --  113*  CREATININE 13.36*  --   --  6.76*  CALCIUM 5.6*  --   --  6.4*  PHOS >12.0*  --   --  5.3*  HGB  --    < > 6.5* 8.0*  8.0*   < > = values in this interval not displayed.   Inpatient medications: . chlorhexidine gluconate (MEDLINE KIT)  15 mL Mouth Rinse BID  . Chlorhexidine Gluconate Cloth  6 each Topical Q0600  . heparin  5,000 Units Subcutaneous Q8H  . mouth rinse  15 mL Mouth Rinse 10 times per day  . pantoprazole (PROTONIX) IV  40 mg Intravenous Q24H   .  prismasol BGK 4/2.5 400 mL/hr at 01/09/20 0040  . sodium chloride Stopped (01/09/20 0519)  . sodium chloride 10 mL/hr at 01/09/20 1200  . cefTRIAXone (ROCEPHIN)  IV Stopped (01/09/20 0143)  . dextrose 5 % and 0.45% NaCl 75 mL/hr at 01/09/20 1200  . fentaNYL infusion INTRAVENOUS 200 mcg/hr (01/09/20 1200)  . insulin 1.3 mL/hr at 01/09/20 1200  . midazolam 0.5 mg/hr (01/09/20 1200)  . prismasol BGK 4/2.5 1,800  mL/hr at 01/09/20 1107  . sodium bicarbonate (isotonic) 1000 mL infusion 100 mL/hr at 01/09/20 0933  . sodium chloride     sodium chloride, alteplase, dextrose, heparin, midazolam, sodium chloride

## 2020-01-09 NOTE — Progress Notes (Signed)
CRITICAL VALUE ALERT  Critical Value:  Hgb 6.7  Date & Time Notied:  01/09/20 1333  Provider Notified: PA  Orders Received/Actions taken: will transfuse

## 2020-01-10 DIAGNOSIS — N390 Urinary tract infection, site not specified: Secondary | ICD-10-CM | POA: Diagnosis not present

## 2020-01-10 DIAGNOSIS — E872 Acidosis: Secondary | ICD-10-CM | POA: Diagnosis not present

## 2020-01-10 DIAGNOSIS — N179 Acute kidney failure, unspecified: Secondary | ICD-10-CM | POA: Diagnosis not present

## 2020-01-10 DIAGNOSIS — B9620 Unspecified Escherichia coli [E. coli] as the cause of diseases classified elsewhere: Secondary | ICD-10-CM

## 2020-01-10 DIAGNOSIS — E1110 Type 2 diabetes mellitus with ketoacidosis without coma: Secondary | ICD-10-CM

## 2020-01-10 LAB — RENAL FUNCTION PANEL
Albumin: 2.3 g/dL — ABNORMAL LOW (ref 3.5–5.0)
Albumin: 2.3 g/dL — ABNORMAL LOW (ref 3.5–5.0)
Anion gap: 11 (ref 5–15)
Anion gap: 12 (ref 5–15)
BUN: 26 mg/dL — ABNORMAL HIGH (ref 8–23)
BUN: 21 mg/dL (ref 8–23)
CO2: 24 mmol/L (ref 22–32)
CO2: 24 mmol/L (ref 22–32)
Calcium: 7.3 mg/dL — ABNORMAL LOW (ref 8.9–10.3)
Calcium: 7.2 mg/dL — ABNORMAL LOW (ref 8.9–10.3)
Chloride: 102 mmol/L (ref 98–111)
Chloride: 98 mmol/L (ref 98–111)
Creatinine, Ser: 2.67 mg/dL — ABNORMAL HIGH (ref 0.44–1.00)
Creatinine, Ser: 2.18 mg/dL — ABNORMAL HIGH (ref 0.44–1.00)
GFR calc Af Amer: 20 mL/min — ABNORMAL LOW (ref >60)
GFR calc Af Amer: 26 mL/min — ABNORMAL LOW (ref >60)
GFR calc non Af Amer: 17 mL/min — ABNORMAL LOW (ref >60)
GFR calc non Af Amer: 22 mL/min — ABNORMAL LOW (ref >60)
Glucose, Bld: 101 mg/dL — ABNORMAL HIGH (ref 70–99)
Glucose, Bld: 106 mg/dL — ABNORMAL HIGH (ref 70–99)
Phosphorus: 2.6 mg/dL (ref 2.5–4.6)
Phosphorus: 2.3 mg/dL — ABNORMAL LOW (ref 2.5–4.6)
Potassium: 4.3 mmol/L (ref 3.5–5.1)
Potassium: 4.1 mmol/L (ref 3.5–5.1)
Sodium: 137 mmol/L (ref 135–145)
Sodium: 134 mmol/L — ABNORMAL LOW (ref 135–145)

## 2020-01-10 LAB — PHOSPHORUS: Phosphorus: 2.2 mg/dL — ABNORMAL LOW (ref 2.5–4.6)

## 2020-01-10 LAB — CBC WITH DIFFERENTIAL/PLATELET
Abs Immature Granulocytes: 0.06 10*3/uL (ref 0.00–0.07)
Basophils Absolute: 0 10*3/uL (ref 0.0–0.1)
Basophils Relative: 0 %
Eosinophils Absolute: 0 10*3/uL (ref 0.0–0.5)
Eosinophils Relative: 0 %
HCT: 31.3 % — ABNORMAL LOW (ref 36.0–46.0)
Hemoglobin: 10.6 g/dL — ABNORMAL LOW (ref 12.0–15.0)
Immature Granulocytes: 1 %
Lymphocytes Relative: 6 %
Lymphs Abs: 0.6 10*3/uL — ABNORMAL LOW (ref 0.7–4.0)
MCH: 29.3 pg (ref 26.0–34.0)
MCHC: 33.9 g/dL (ref 30.0–36.0)
MCV: 86.5 fL (ref 80.0–100.0)
Monocytes Absolute: 0.6 10*3/uL (ref 0.1–1.0)
Monocytes Relative: 6 %
Neutro Abs: 8.6 10*3/uL — ABNORMAL HIGH (ref 1.7–7.7)
Neutrophils Relative %: 87 %
Platelets: 93 10*3/uL — ABNORMAL LOW (ref 150–400)
RBC: 3.62 MIL/uL — ABNORMAL LOW (ref 3.87–5.11)
RDW: 14.1 % (ref 11.5–15.5)
WBC: 9.8 10*3/uL (ref 4.0–10.5)
nRBC: 0 % (ref 0.0–0.2)

## 2020-01-10 LAB — TYPE AND SCREEN
ABO/RH(D): A POS
Antibody Screen: NEGATIVE
Unit division: 0
Unit division: 0

## 2020-01-10 LAB — MAGNESIUM
Magnesium: 2.2 mg/dL (ref 1.7–2.4)
Magnesium: 2.2 mg/dL (ref 1.7–2.4)

## 2020-01-10 LAB — GLUCOSE, CAPILLARY
Glucose-Capillary: 111 mg/dL — ABNORMAL HIGH (ref 70–99)
Glucose-Capillary: 98 mg/dL (ref 70–99)
Glucose-Capillary: 96 mg/dL (ref 70–99)
Glucose-Capillary: 124 mg/dL — ABNORMAL HIGH (ref 70–99)
Glucose-Capillary: 80 mg/dL (ref 70–99)
Glucose-Capillary: 133 mg/dL — ABNORMAL HIGH (ref 70–99)
Glucose-Capillary: 114 mg/dL — ABNORMAL HIGH (ref 70–99)

## 2020-01-10 LAB — HEMOGLOBIN AND HEMATOCRIT, BLOOD
HCT: 29.3 % — ABNORMAL LOW (ref 36.0–46.0)
Hemoglobin: 9.9 g/dL — ABNORMAL LOW (ref 12.0–15.0)

## 2020-01-10 LAB — PROTEIN / CREATININE RATIO, URINE
Creatinine, Urine: 13.21 mg/dL
Protein Creatinine Ratio: 7.72 mg/mg{Cre} — ABNORMAL HIGH (ref 0.00–0.15)
Total Protein, Urine: 102 mg/dL

## 2020-01-10 LAB — BPAM RBC
Blood Product Expiration Date: 202104302359
Blood Product Expiration Date: 202104302359
ISSUE DATE / TIME: 202104040210
ISSUE DATE / TIME: 202104041606
Unit Type and Rh: 6200
Unit Type and Rh: 6200

## 2020-01-10 LAB — APTT: aPTT: 37 seconds — ABNORMAL HIGH (ref 24–36)

## 2020-01-10 LAB — BETA-HYDROXYBUTYRIC ACID: Beta-Hydroxybutyric Acid: 0.77 mmol/L — ABNORMAL HIGH (ref 0.05–0.27)

## 2020-01-10 MED ORDER — DEXMEDETOMIDINE HCL IN NACL 400 MCG/100ML IV SOLN
0.0000 ug/kg/h | INTRAVENOUS | Status: DC
  Administered 2020-01-10: 10:00:00 0.2 ug/kg/h via INTRAVENOUS
  Administered 2020-01-11: 03:00:00 0.6 ug/kg/h via INTRAVENOUS
  Filled 2020-01-10 (×2): qty 100

## 2020-01-10 MED ORDER — INSULIN DETEMIR 100 UNIT/ML ~~LOC~~ SOLN
5.0000 [IU] | Freq: Two times a day (BID) | SUBCUTANEOUS | Status: DC
  Administered 2020-01-10 – 2020-01-11 (×3): 5 [IU] via SUBCUTANEOUS
  Filled 2020-01-10 (×5): qty 0.05

## 2020-01-10 MED ORDER — VITAL HIGH PROTEIN PO LIQD
1000.0000 mL | ORAL | Status: DC
  Administered 2020-01-11: 1000 mL

## 2020-01-10 MED ORDER — VITAL HIGH PROTEIN PO LIQD
1000.0000 mL | ORAL | Status: DC
  Administered 2020-01-10: 09:00:00 1000 mL

## 2020-01-10 MED ORDER — LABETALOL HCL 5 MG/ML IV SOLN
5.0000 mg | INTRAVENOUS | Status: DC | PRN
  Administered 2020-01-10 – 2020-01-12 (×3): 5 mg via INTRAVENOUS
  Filled 2020-01-10 (×3): qty 4

## 2020-01-10 MED ORDER — SODIUM CHLORIDE 0.9 % IV SOLN
10.0000 mg | Freq: Every day | INTRAVENOUS | Status: DC
  Administered 2020-01-10: 10 mg via INTRAVENOUS
  Filled 2020-01-10 (×2): qty 1

## 2020-01-10 MED ORDER — FENTANYL BOLUS VIA INFUSION
50.0000 ug | INTRAVENOUS | Status: DC | PRN
  Administered 2020-01-10 – 2020-01-11 (×2): 50 ug via INTRAVENOUS
  Filled 2020-01-10: qty 50

## 2020-01-10 MED ORDER — IPRATROPIUM-ALBUTEROL 0.5-2.5 (3) MG/3ML IN SOLN
3.0000 mL | Freq: Four times a day (QID) | RESPIRATORY_TRACT | Status: DC
  Administered 2020-01-10 – 2020-01-13 (×14): 3 mL via RESPIRATORY_TRACT
  Filled 2020-01-10 (×13): qty 3

## 2020-01-10 MED ORDER — LABETALOL HCL 5 MG/ML IV SOLN
5.0000 mg | Freq: Once | INTRAVENOUS | Status: AC
  Administered 2020-01-10: 5 mg via INTRAVENOUS
  Filled 2020-01-10: qty 4

## 2020-01-10 MED ORDER — PRO-STAT SUGAR FREE PO LIQD
30.0000 mL | Freq: Two times a day (BID) | ORAL | Status: DC
  Administered 2020-01-10: 09:00:00 30 mL
  Filled 2020-01-10: qty 30

## 2020-01-10 MED ORDER — INSULIN ASPART 100 UNIT/ML ~~LOC~~ SOLN
1.0000 [IU] | SUBCUTANEOUS | Status: DC
  Administered 2020-01-10: 1 [IU] via SUBCUTANEOUS
  Administered 2020-01-11 – 2020-01-12 (×3): 2 [IU] via SUBCUTANEOUS
  Administered 2020-01-12 – 2020-01-13 (×2): 1 [IU] via SUBCUTANEOUS

## 2020-01-10 MED ORDER — POLYETHYLENE GLYCOL 3350 17 G PO PACK
17.0000 g | PACK | Freq: Two times a day (BID) | ORAL | Status: DC
  Administered 2020-01-10 – 2020-01-11 (×3): 17 g via ORAL
  Filled 2020-01-10 (×3): qty 1

## 2020-01-10 MED ORDER — SENNA 8.6 MG PO TABS
1.0000 | ORAL_TABLET | Freq: Two times a day (BID) | ORAL | Status: DC
  Administered 2020-01-10 – 2020-01-11 (×3): 8.6 mg via ORAL
  Filled 2020-01-10 (×3): qty 1

## 2020-01-10 NOTE — Progress Notes (Signed)
[facility] NEPHROLOGY PROGRESS NOTE  Assessment/ Plan: Pt is a 74 y.o. yo female with COPD, DM, breast cancer, admitted with respiratory failure on mechanical ventilation found to have DKA, UTI and severe AKI requiring CRRT.  #Severe acute kidney injury likely due to severe dehydration, UTI and DKA: UA with possible UTI.  Quantify proteinuria.  A1c 8.5.  Kidney US with no hydronephrosis relatively small right kidney.  Severely acidemic on presentation therefore CRRT was started.  Tolerating well. 4 k bath. Continue to monitor lab.  #Hyperkalemia: Improved with dialysis.  #Metabolic acidosis: Off sodium bicarbonate.  Improved.  #Acute hypoxic respiratory failure: Currently intubated.  Per PCCM.  Subjective: Seen and examined ICU.  Remains sedated and intubated.  On CRRT.  238 cc.  Total UF 2338 via CRRT.  No new event. Objective Vital signs in last 24 hours: Vitals:   01/10/20 0600 01/10/20 0700 01/10/20 0730 01/10/20 0800  BP: (!) 158/77 (!) 161/77 (!) 161/77 (!) 148/73  Pulse: 97 94  (!) 102  Resp: (!) 30 (!) 21  (!) 30  Temp: 98.1 F (36.7 C) 98.2 F (36.8 C)  98.6 F (37 C)  TempSrc:    Bladder  SpO2: 100% 100% 100% 98%  Weight:       Weight change: 1.6 kg  Intake/Output Summary (Last 24 hours) at 01/10/2020 0845 Last data filed at 01/10/2020 0800 Gross per 24 hour  Intake 3029.51 ml  Output 2787 ml  Net 242.51 ml       Labs: Basic Metabolic Panel: Recent Labs  Lab 01/09/20 0940 01/09/20 1413 01/09/20 2033  NA 135 135 135  K 3.5 3.6 3.7  CL 100 99 101  CO2 18* 18* 23  GLUCOSE 308* 110* 118*  BUN 113* 93* 53*  CREATININE 6.76* 5.88* 4.08*  CALCIUM 6.4* 6.8* 6.9*  PHOS 5.3* 4.9* 3.3   Liver Function Tests: Recent Labs  Lab 01/08/20 1412 01/08/20 1412 01/08/20 2210 01/08/20 2210 01/09/20 0940 01/09/20 1413 01/09/20 2033  AST 17  --  18  --   --   --   --   ALT 26  --  21  --   --   --   --   ALKPHOS 120  --  87  --   --   --   --    BILITOT 0.9  --  0.8  --   --   --   --   PROT 8.2*  --  5.3*  --   --   --   --   ALBUMIN 3.5   < > 2.1*   < > 2.1* 2.3* 2.2*   < > = values in this interval not displayed.   No results for input(s): LIPASE, AMYLASE in the last 168 hours. No results for input(s): AMMONIA in the last 168 hours. CBC: Recent Labs  Lab 01/08/20 1412 01/08/20 1412 01/08/20 2220 01/08/20 2220 01/08/20 2241 01/08/20 2356 01/09/20 0940 01/09/20 0940 01/09/20 1314 01/09/20 2033 01/10/20 0737  WBC 18.4*   < > 13.4*  --  14.6*  --  12.1*  --   --   --   --   NEUTROABS 17.5*  --   --   --   --   --   --   --   --   --   --   HGB 9.0*   < > 6.8*   < > 6.9*   < > 8.0*  8.0*   < > 6.7*  9.4* 9.9*  HCT 26.1*   < > 20.5*   < > 20.5*   < > 22.9*  22.8*   < > 19.5* 26.3* 29.3*  MCV 83.1  --  86.1  --  85.1  --  80.9  --   --   --   --   PLT 330   < > 216  --  239  --  156  --   --   --   --    < > = values in this interval not displayed.   Cardiac Enzymes: Recent Labs  Lab 01/08/20 1622  CKTOTAL 178   CBG: Recent Labs  Lab 01/09/20 2307 01/09/20 2341 01/10/20 0108 01/10/20 0334 01/10/20 0732  GLUCAP 118* 122* 111* 98 96    Iron Studies:  Recent Labs    01/08/20 2345  IRON 143  TIBC 172*  FERRITIN 702*   Studies/Results: DG Abdomen 1 View  Result Date: 01/08/2020 CLINICAL DATA:  Enteric catheter placement. EXAM: ABDOMEN - 1 VIEW COMPARISON:  January 17, 2005 FINDINGS: The bowel gas pattern is nonobstructive. Enteric catheter overlies the gastric body. IMPRESSION: Enteric catheter overlies the gastric body. Electronically Signed   By: Fidela Salisbury M.D.   On: 01/08/2020 20:00   US RENAL  Result Date: 01/09/2020 CLINICAL DATA:  Acute kidney injury. EXAM: RENAL / URINARY TRACT ULTRASOUND COMPLETE COMPARISON:  None. FINDINGS: Right Kidney: Renal measurements: 9.5 x 3.9 x 4.7 cm = volume: 92 mL. Borderline increased parenchymal echogenicity. No mass or stone. No hydronephrosis. Left Kidney:  Renal measurements: 1.4 x 4.9 x 6.7 cm = volume: 198 mL. Normal parenchymal echogenicity. Upper pole cyst, 1.7 x 2.1 x 1.6 cm. No other masses, no stones and no hydronephrosis. Bladder: Not visualized. Other: None. IMPRESSION: 1. No acute findings.  No hydronephrosis. 2. Relative small right kidney with borderline increased renal parenchymal echogenicity consistent medical renal disease. 3. Left renal cyst. Electronically Signed   By: Lajean Manes M.D.   On: 01/09/2020 07:59   DG CHEST PORT 1 VIEW  Result Date: 01/08/2020 CLINICAL DATA:  Check central line placement EXAM: PORTABLE CHEST 1 VIEW COMPARISON:  Film from earlier in the same day. FINDINGS: Endotracheal tube and gastric catheter are noted in satisfactory position. Previously seen left jugular central line is been removed and a new temporary dialysis catheter is noted via the left jugular approach. Catheter tip is noted at the cavoatrial junction. No pneumothorax is seen. Persistent right basilar atelectasis and small effusion is seen. No acute bony abnormality is noted. IMPRESSION: No pneumothorax following central line exchange. The temporary dialysis catheter is in satisfactory position. Stable right basilar atelectasis and effusion. Electronically Signed   By: Inez Catalina M.D.   On: 01/08/2020 23:40   DG Chest Portable 1 View  Result Date: 01/08/2020 CLINICAL DATA:  Check endotracheal tube placement and central line placement EXAM: PORTABLE CHEST 1 VIEW COMPARISON:  Film from earlier in the same day. FINDINGS: Endotracheal tube is noted in satisfactory position 4 cm above the carina. Left jugular central line is noted in the mid superior vena cava. No pneumothorax is seen. Gastric catheter extends into the stomach. Cardiac shadow is within normal limits. Aortic calcifications are seen. Patchy atelectatic changes are noted in the bases new from the prior exam. Hyperinflation is again seen. IMPRESSION: Tubes and lines in satisfactory position.  Mild bibasilar atelectasis. Electronically Signed   By: Inez Catalina M.D.   On: 01/08/2020 19:59   DG Chest [facility]  1 View  Result Date: 01/08/2020 CLINICAL DATA:  Increased shortness of breath and chills for 3-4 days. History of COPD. EXAM: PORTABLE CHEST 1 VIEW COMPARISON:  CT, 02/11/2014. FINDINGS: Cardiac silhouette is normal in size. Normal mediastinal and hilar contours. Lungs are hyperexpanded. Mild interstitial prominence in the lower lungs. No evidence of pneumonia or pulmonary edema. No pleural effusion or pneumothorax. Left axillary vascular clips consistent with prior breast surgery. Skeletal structures are grossly intact. IMPRESSION: 1. No acute cardiopulmonary disease. 2. Lung hyperexpansion consistent with COPD. Electronically Signed   By: Lajean Manes M.D.   On: 01/08/2020 14:51    Medications: Infusions: .  prismasol BGK 4/2.5 400 mL/hr at 01/10/20 0249  .  prismasol BGK 4/2.5 200 mL/hr at 01/09/20 1352  . sodium chloride 10 mL/hr at 01/10/20 0800  . sodium chloride Stopped (01/09/20 1241)  . cefTRIAXone (ROCEPHIN)  IV Stopped (01/09/20 2300)  . dextrose 5 % and 0.45% NaCl 75 mL/hr at 01/10/20 0800  . fentaNYL infusion INTRAVENOUS 50 mcg/hr (01/10/20 0800)  . insulin Stopped (01/10/20 0108)  . midazolam 0.5 mg/hr (01/10/20 0800)  . prismasol BGK 4/2.5 1,800 mL/hr at 01/10/20 5396  . sodium chloride      Scheduled Medications: . chlorhexidine gluconate (MEDLINE KIT)  15 mL Mouth Rinse BID  . Chlorhexidine Gluconate Cloth  6 each Topical Q0600  . heparin  5,000 Units Subcutaneous Q8H  . insulin aspart  1-3 Units Subcutaneous Q4H  . insulin detemir  5 Units Subcutaneous Q12H  . mouth rinse  15 mL Mouth Rinse 10 times per day  . pantoprazole (PROTONIX) IV  40 mg Intravenous Q24H    have reviewed scheduled and prn medications.  Physical Exam: General: Intubated, sedated Heart:RRR, s1s2 nl, no rubs Lungs: Coarse breath sound bilateral Abdomen:soft, Non-tender,  non-distended Extremities:No edema Dialysis Access: Left IJ temporary HD catheter  Phill Steck Tanna Furry 01/10/2020,8:45 AM  LOS: 2 days  Pager: [phone]

## 2020-01-10 NOTE — Progress Notes (Addendum)
NAME:  Cindy Burns, MRN:  [id number], DOB:  1946-02-07, LOS: 2 ADMISSION DATE:  01/08/2020, CONSULTATION DATE:  01/08/20 REFERRING MD:  EDP, CHIEF COMPLAINT:  SHOB   Brief History   Cindy Burns is a 74 y.o female with a hx of breast cancer, COPD, MDD, and DM who presented to [facility] with SOB. She was found to have acute renal failure with a significant high anion gap metabolic acidosis and hyperkalemia. She was subsequently transferred to [facility] cone for HD and further evaluation/management.  History of present illness   Cindy Burns is a 74 y.o female with a hx of breast cancer, COPD, MDD, and DM who presented to [facility] with SOB. Patient reportly had 4 days of worsening SOB with her chronic cough. She called EMS because her symptoms had progressed. Fire department arrived to find the patient hypoxic and she was placed on NRB. By the time EMS arrived the patient was able to be transitioned to 3L/min [place]. On arrival to ED, requiring Bipap.  Unable to tolerate bipap and subsequently intubated with significant metabolic acidosis. U/a positive .  Urine culture, blood culture sent. Central line placed in left IJ.  Started on vanc, ceft.    Transferred to [place] with last abg 7.08/19/166/ bicarb 3.  Nephrology consulted.  Pending IHD for hyperkalemia, metabolic acidosis.    On admission found to be anemia with hb of 6.9 down from 9.0 at [place] prior to 4L resuscitation.  Also found to have negative urine ketones, positive betahydroxybutyrate.    Past Medical History   Past Medical History:  Diagnosis Date  . Cancer [facility]) 2005   Breast Cancer  . COPD (chronic obstructive pulmonary disease) ([place])   . Depression   . Diabetes mellitus without complication ([place])   . Hyperlipidemia    Significant Hospital Events   4/3 > Admitted for acute renal failure with hyperkalemia, placement of an HD cath   Consults:  Nephrology   Procedures:  4/3 >  left IJ placed at [place], intubation  Significant Diagnostic Tests:   CXR 4/3:  1. No acute cardiopulmonary disease. 2. Lung hyperexpansion consistent with COPD.  Renal Ultrasound 4/4:  1. No acute findings.  No hydronephrosis. 2. Relative small right kidney with borderline increased renal parenchymal echogenicity consistent medical renal disease. 3. Left renal cyst.  Micro Data:  SARs COVID > Negative 4/3 Blood cultures > Pending  Urine culture > Pending   Antimicrobials:  Ceftriaxone 4/3 >> Vancomycin 4/3-4/4  Interim history/subjective:   Overnight, patient had hypoglycemia noted. She was subsequently switched from insulin drip to [place] insulin, SSI sensitive and Levemir.  Objective   Blood pressure (!) 158/77, pulse 97, temperature 98.1 F (36.7 C), resp. rate (!) 30, weight 54.5 kg, SpO2 100 %.    Vent Mode: PRVC FiO2 (%):  [40 %] 40 % Set Rate:  [30 bmp] 30 bmp Vt Set:  [400 mL] 400 mL PEEP:  [5 cmH20] 5 cmH20 Plateau Pressure:  [13 cmH20-19 cmH20] 17 cmH20   Intake/Output Summary (Last 24 hours) at 01/10/2020 0634 Last data filed at 01/10/2020 0600 Gross per 24 hour  Intake 3066.83 ml  Output 2574 ml  Net 492.83 ml   Filed Weights   01/08/20 2147 01/09/20 0500 01/10/20 0100  Weight: 52.9 kg 52.9 kg 54.5 kg   Physical Exam Vitals and nursing note reviewed.  Constitutional:      Appearance: She is ill-appearing.     Interventions: She  is sedated and intubated.  Cardiovascular:     Rate and Rhythm: Normal rate and regular rhythm.     Heart sounds: No murmur.  Pulmonary:     Effort: Pulmonary effort is normal. No respiratory distress. She is intubated.     Breath sounds: No wheezing or rales.  Abdominal:     General: Bowel sounds are normal. There is no distension.     Palpations: Abdomen is soft.     Tenderness: There is no abdominal tenderness. There is no guarding.  Skin:    General: Skin is warm and dry.     Coloration: Skin is not jaundiced.      Findings: Erythema (mild erythema of left foot, mild without lesions, papules or macules) present. No bruising or lesion.  Neurological:     Comments: Increasingly alert this AM, able to answer questions appropriately.     Resolved Hospital Problem list   None  Assessment & Plan:   Ketoacidosis , likely combination of diabetic and starvation In the setting of medication non-compliance with poor social support.  - Switched from insulin drip to [place] insulin on 4/5 - SSI (sensitive) + Levemir 5 units BID  - Discontinue beta-hydroxy trending   Acute Respiratory Failure Patient presented with SOB and required Bipap on arrival to [facility] ED. Due to inability to tolerate Bipap, she was subsequently intubated for airway protection. Secondary to severe metabolic acidosis.  - Continue PRVC, wean as tolerated - Spontaneous breathing trials today   Acute Renal Failure  Normal creatinine last noted in October 2020. Differential includes hypovolemia in light of severe DKA versus nephrotic syndrome. Renal ultrasound shows right kidney with changes consistent with chronic renal disease, but no obstruction or hydronephrosis to explain acute failure.  - Nephrology following; we appreciate their recommendations  - CRRT, per Nephrology  - Protein/creatnine ratio   Acute Normocytic Anemia Initial hemoglobin of 9.0, however likely hemo-concentrated. Upon fluid resuscitation, hemoglobin dropped to 6.8. Iron panel does not show evidence of iron deficiency. Reliability of results in an acute process is variable though. Folate and B12 normal. Required 2 pRBCs during admission thus far. Hemoglobin improving at this time.  - CBC BID  Urinary Tract Infection  Urinalysis at [place] showed many bacteria with large leukocytes. She was started on Vancomycin and Ceftriaxone. Vancomycin discontinued as MRSA unlikely and patient has severe AKI. Culture currently growing E.coli although lower quantity of colonies;  susceptibilities pending  - Ceftriaxone x 7 days, currently Day 3/7 - Follow culture susceptibilities   Hypocalcemia  On arrival, calcium of 5.6. She received 1 g of CaCl. Improved on repeat. Likely related to acute renal failure - Monitor daily, replete PRN  Hypermagnesemia  - Monitor daily, replete PRN  Hyperphosphatemia  Continue to monitor as patient has been initiated on dialysis.   COPD - No wheezing noted on examination  - Duonebs q6h  Hypertension Hx of longstanding HTN at home; medications included Losartan. She has become increasingly more hypertensive during admission, more so today that sedatives are being weaned.  - Labetalol 5 mg for BP > 180/110  Best practice:  Diet: Starting tube feeds today Pain/Anxiety/Delirium protocol (if indicated): Fentanyl + Precedex PRN VAP protocol (if indicated): Per protocol DVT prophylaxis: Heparin  GI prophylaxis: Protonix changed to Pepcid today Glucose control: SSI + Levemir  Mobility: Bed rest Bowel Regimen: Miralax + Senna  Code Status: Full Family Communication: Niece updated on 4/5 Disposition: ICU  Labs   CBC: Recent Labs  Lab 01/08/20  1412 01/08/20 1412 01/08/20 2220 01/08/20 2220 01/08/20 2241 01/08/20 2356 01/09/20 0940 01/09/20 1314 01/09/20 2033  WBC 18.4*  --  13.4*  --  14.6*  --  12.1*  --   --   NEUTROABS 17.5*  --   --   --   --   --   --   --   --   HGB 9.0*   < > 6.8*   < > 6.9* 6.5* 8.0*  8.0* 6.7* 9.4*  HCT 26.1*   < > 20.5*   < > 20.5* 19.0* 22.9*  22.8* 19.5* 26.3*  MCV 83.1  --  86.1  --  85.1  --  80.9  --   --   PLT 330  --  216  --  239  --  156  --   --    < > = values in this interval not displayed.    Basic Metabolic Panel: Recent Labs  Lab 01/08/20 1412 01/08/20 1412 01/08/20 1811 01/08/20 2210 01/08/20 2356 01/09/20 0940 01/09/20 1413 01/09/20 2033  NA 126*   < >  --  132* 131* 135 135 135  K 7.4*   < >  --  4.3 4.5 3.5 3.6 3.7  CL 90*  --   --  98  --  100 99 101   CO2 <7*  --   --  8*  --  18* 18* 23  GLUCOSE 225*   < > 273* 323*  --  308* 110* 118*  BUN 225*  --   --  196*  --  113* 93* 53*  CREATININE 15.43*  --   --  13.36*  --  6.76* 5.88* 4.08*  CALCIUM 6.5*  --   --  5.6*  --  6.4* 6.8* 6.9*  MG  --   --   --  1.2*  --   --   --   --   PHOS  --   --   --  >30.0*  --  5.3* 4.9* 3.3   < > = values in this interval not displayed.   GFR: Estimated Creatinine Clearance: 10 mL/min (A) (by C-G formula based on SCr of 4.08 mg/dL (H)). Recent Labs  Lab 01/08/20 1412 01/08/20 1811 01/08/20 2220 01/08/20 2241 01/09/20 0247 01/09/20 0940  WBC 18.4*  --  13.4* 14.6*  --  12.1*  LATICACIDVEN 1.1 3.7* 2.1*  --  1.2  --     Liver Function Tests: Recent Labs  Lab 01/08/20 1412 01/08/20 2210 01/09/20 0940 01/09/20 1413 01/09/20 2033  AST 17 18  --   --   --   ALT 26 21  --   --   --   ALKPHOS 120 87  --   --   --   BILITOT 0.9 0.8  --   --   --   PROT 8.2* 5.3*  --   --   --   ALBUMIN 3.5 2.1* 2.1* 2.3* 2.2*   No results for input(s): LIPASE, AMYLASE in the last 168 hours. No results for input(s): AMMONIA in the last 168 hours.  ABG    Component Value Date/Time   PHART 7.114 (LL) 01/08/2020 2356   PCO2ART 29.2 (L) 01/08/2020 2356   PO2ART 177.0 (H) 01/08/2020 2356   HCO3 9.5 (L) 01/08/2020 2356   TCO2 10 (L) 01/08/2020 2356   ACIDBASEDEF 18.0 (H) 01/08/2020 2356   O2SAT 99.0 01/08/2020 2356     Coagulation Profile: Recent Labs  Lab 01/08/20 2345  INR 1.4*    Cardiac Enzymes: Recent Labs  Lab 01/08/20 1622  CKTOTAL 178    HbA1C: Hgb A1c MFr Bld  Date/Time Value Ref Range Status  01/08/2020 11:45 PM 8.5 (H) 4.8 - 5.6 % Final    Comment:    (NOTE) Pre diabetes:          5.7%-6.4% Diabetes:              >6.4% Glycemic control for   <7.0% adults with diabetes   07/29/2019 09:48 AM 6.8 (H) 4.6 - 6.5 % Final    Comment:    Glycemic Control Guidelines for People with Diabetes:Non Diabetic:  <6%Goal of Therapy:  <7%Additional Action Suggested:  >8%    CBG: Recent Labs  Lab 01/09/20 2200 01/09/20 2307 01/09/20 2341 01/10/20 0108 01/10/20 0334  GLUCAP 121* 118* 122* 111* 98    Review of Systems:   Unable to obtain  Past Medical History  She,  has a past medical history of Cancer ([place]) (2005), COPD (chronic obstructive pulmonary disease) ([place]), Depression, Diabetes mellitus without complication ([place]), and Hyperlipidemia.   Surgical History    Past Surgical History:  Procedure Laterality Date  . BREAST SURGERY  2005  . CATARACT EXTRACTION    . CHOLECYSTECTOMY  2013     Social History   reports that she has been smoking cigarettes. She has a 40.00 pack-year smoking history. She has never used smokeless tobacco. She reports that she does not drink alcohol or use drugs.   Family History   Her family history includes Alzheimer's disease in her mother; Arthritis in her mother; COPD in her brother and brother; Dementia in her father.   Allergies No Known Allergies   Medications Prior to Admission: - Ezetimibe 10 mg daily QD - Gabapentin 800 mg at bedtime  - Glipizide 10 mg QD - Januvia 100 mg QD - Losartan 25 mg QD - Rosuvastatin 20 mg QD  - Symbicort - Venlafaxine 65 mg QD - Albuterol    Dr. Jose Persia Internal Medicine PGY-1  Pager: [phone] 01/10/2020, 6:34 AM      Pulmonary critical care attending:  74 year old female past medical history of COPD diabetes admitted for hyperglycemia, DKA, hyperkalemia, anion gap metabolic acidosis.  Patient was intubated after failing BiPAP therapy.  Was also found to be in renal failure presumed related to hypovolemia.  Additionally concern for urinary tract infection cultures have grown 10,000 colonies of E. coli.    Patient remains in the intensive care unit on mechanical life support.  Patient was transitioned to an SBT this morning however very weak and unable to maintain.  Therefore was put back in a rate.  BP (!) 170/71    Pulse 79   Temp (!) 97.5 F (36.4 C)   Resp 19   Wt 54.5 kg   SpO2 100%   BMI 21.98 kg/m   General: Elderly female intubated on mechanical life support, awake follows commands HEENT: NCAT, tracking appropriately Heart: Regular rhythm S1-S2 Lungs: Bilateral mechanically ventilated breath sounds  Labs: Reviewed Serum creatinine 2.67 Hemoglobin 9.9 hematocrit 29.3  Chest imaging: No significant infiltrate no pneumothorax  Assessment: Acute hypoxemic respiratory failure requiring intubation and mechanical ventilation Anion gap metabolic acidosis secondary to diabetic ketoacidosis, possible starvation ketosis Acute renal failure, likely hypovolemic, prerenal azotemia Sepsis secondary to urinary tract infection, E. coli History of COPD Hypertension  Plan: Ceftriaxone x7 days Follow CBC Start scheduled nebulized treatments Stop continuous sedation As needed plus  Precedex started today SBT tomorrow morning hopeful for potential liberation from mechanical support.  Appears too weak for this today. CVVHD per nephro  This patient is critically ill with multiple organ system failure; which, requires frequent high complexity decision making, assessment, support, evaluation, and titration of therapies. This was completed through the application of advanced monitoring technologies and extensive interpretation of multiple databases. During this encounter critical care time was devoted to patient care services described in this note for 32 minutes.  Garner Nash, DO [facility] 01/10/2020 3:35 PM

## 2020-01-10 NOTE — Progress Notes (Signed)
Initial Nutrition Assessment  DOCUMENTATION CODES:   Not applicable  INTERVENTION:   Tube feeding:   Vital High Protein at 55 ml/h (1320 ml per day)   Provides 1320 kcal, 116 gm protein, 1104 ml free water daily  NUTRITION DIAGNOSIS:   Inadequate oral intake related to inability to eat as evidenced by NPO status.  GOAL:   Patient will meet greater than or equal to 90% of their needs  MONITOR:   Vent status, Labs, TF tolerance, Skin  REASON FOR ASSESSMENT:   Ventilator, Consult Enteral/tube feeding initiation and management  ASSESSMENT:   74 yo female admitted with respiratory failure, DKA, UTI, AKI. Intubated on admission. PMH includes COPD, DM, breast cancer.   Patient has been on CRRT since 4/4.  Insulin drip has been stopped. Received MD Consult for TF initiation and management. Currently receiving Vital High Protein at 25 ml/h. Tolerating well.  Discussed patient with RN today. Patient may be able to be extubated tomorrow.   Patient is currently intubated on ventilator support MV: 13 L/min Temp (24hrs), Avg:97.6 F (36.4 C), Min:96.4 F (35.8 C), Max:98.6 F (37 C)   Labs reviewed. BUN 26 (H), creatinine 2.67 (H) CBG's: [phone]  Medications reviewed and include novolog, levemir, precedex.   IVF: D5 1/2NS at 75 ml/h  Weight is down by 8% over the past 6 months, not significant for the time frame.  I/O +1.1 L since admission.  NUTRITION - FOCUSED PHYSICAL EXAM:    Most Recent Value  Orbital Region  Unable to assess  Upper Arm Region  Mild depletion  Thoracic and Lumbar Region  Unable to assess  Buccal Region  Unable to assess  Temple Region  No depletion  Clavicle Bone Region  No depletion  Clavicle and Acromion Bone Region  No depletion  Scapular Bone Region  No depletion  Dorsal Hand  Unable to assess  Patellar Region  Mild depletion  Anterior Thigh Region  Unable to assess  Posterior Calf Region  Unable to assess  Edema (RD  Assessment)  Mild  Hair  Reviewed  Eyes  Unable to assess  Mouth  Unable to assess  Skin  Reviewed  Nails  Reviewed       Diet Order:   Diet Order            Diet NPO time specified  Diet effective now              EDUCATION NEEDS:   Not appropriate for education at this time  Skin:  Skin Assessment: Skin Integrity Issues: Skin Integrity Issues:: DTI, Stage II, Other (Comment) DTI: L heel Stage II: coccyx Other: wound to L big toe; MASD to groin, perineum, buttocks   Last BM:  no BM documented  Height:   Ht Readings from Last 1 Encounters:  01/08/20 5\' 2"  (1.575 m)    Weight:   Wt Readings from Last 1 Encounters:  01/10/20 54.5 kg    Ideal Body Weight:  50 kg  BMI:  Body mass index is 21.98 kg/m.  Estimated Nutritional Needs:   Kcal:  1350  Protein:  100-120 gm  Fluid:  >/= 1.5 L    Molli Barrows, RD, LDN, CNSC Please refer to Amion for contact information.

## 2020-01-11 DIAGNOSIS — Z9911 Dependence on respirator [ventilator] status: Secondary | ICD-10-CM

## 2020-01-11 DIAGNOSIS — N179 Acute kidney failure, unspecified: Secondary | ICD-10-CM | POA: Diagnosis not present

## 2020-01-11 DIAGNOSIS — J9601 Acute respiratory failure with hypoxia: Secondary | ICD-10-CM | POA: Diagnosis not present

## 2020-01-11 LAB — RENAL FUNCTION PANEL
Albumin: 2 g/dL — ABNORMAL LOW (ref 3.5–5.0)
Albumin: 2.1 g/dL — ABNORMAL LOW (ref 3.5–5.0)
Anion gap: 10 (ref 5–15)
Anion gap: 10 (ref 5–15)
BUN: 21 mg/dL (ref 8–23)
BUN: 24 mg/dL — ABNORMAL HIGH (ref 8–23)
CO2: 25 mmol/L (ref 22–32)
CO2: 24 mmol/L (ref 22–32)
Calcium: 7.3 mg/dL — ABNORMAL LOW (ref 8.9–10.3)
Calcium: 7.3 mg/dL — ABNORMAL LOW (ref 8.9–10.3)
Chloride: 101 mmol/L (ref 98–111)
Chloride: 101 mmol/L (ref 98–111)
Creatinine, Ser: 1.72 mg/dL — ABNORMAL HIGH (ref 0.44–1.00)
Creatinine, Ser: 2.39 mg/dL — ABNORMAL HIGH (ref 0.44–1.00)
GFR calc Af Amer: 34 mL/min — ABNORMAL LOW (ref >60)
GFR calc Af Amer: 23 mL/min — ABNORMAL LOW (ref >60)
GFR calc non Af Amer: 29 mL/min — ABNORMAL LOW (ref >60)
GFR calc non Af Amer: 20 mL/min — ABNORMAL LOW (ref >60)
Glucose, Bld: 121 mg/dL — ABNORMAL HIGH (ref 70–99)
Glucose, Bld: 96 mg/dL (ref 70–99)
Phosphorus: 1.9 mg/dL — ABNORMAL LOW (ref 2.5–4.6)
Phosphorus: 2.4 mg/dL — ABNORMAL LOW (ref 2.5–4.6)
Potassium: 3.9 mmol/L (ref 3.5–5.1)
Potassium: 4 mmol/L (ref 3.5–5.1)
Sodium: 136 mmol/L (ref 135–145)
Sodium: 135 mmol/L (ref 135–145)

## 2020-01-11 LAB — CBC WITH DIFFERENTIAL/PLATELET
Abs Immature Granulocytes: 0.05 10*3/uL (ref 0.00–0.07)
Abs Immature Granulocytes: 0.07 10*3/uL (ref 0.00–0.07)
Basophils Absolute: 0 10*3/uL (ref 0.0–0.1)
Basophils Absolute: 0 10*3/uL (ref 0.0–0.1)
Basophils Relative: 0 %
Basophils Relative: 0 %
Eosinophils Absolute: 0.1 10*3/uL (ref 0.0–0.5)
Eosinophils Absolute: 0 10*3/uL (ref 0.0–0.5)
Eosinophils Relative: 1 %
Eosinophils Relative: 1 %
HCT: 29.5 % — ABNORMAL LOW (ref 36.0–46.0)
HCT: 28.9 % — ABNORMAL LOW (ref 36.0–46.0)
Hemoglobin: 10 g/dL — ABNORMAL LOW (ref 12.0–15.0)
Hemoglobin: 9.5 g/dL — ABNORMAL LOW (ref 12.0–15.0)
Immature Granulocytes: 1 %
Immature Granulocytes: 1 %
Lymphocytes Relative: 9 %
Lymphocytes Relative: 9 %
Lymphs Abs: 0.7 10*3/uL (ref 0.7–4.0)
Lymphs Abs: 0.7 10*3/uL (ref 0.7–4.0)
MCH: 28.9 pg (ref 26.0–34.0)
MCH: 29.1 pg (ref 26.0–34.0)
MCHC: 33.9 g/dL (ref 30.0–36.0)
MCHC: 32.9 g/dL (ref 30.0–36.0)
MCV: 85.3 fL (ref 80.0–100.0)
MCV: 88.7 fL (ref 80.0–100.0)
Monocytes Absolute: 0.5 10*3/uL (ref 0.1–1.0)
Monocytes Absolute: 0.4 10*3/uL (ref 0.1–1.0)
Monocytes Relative: 6 %
Monocytes Relative: 6 %
Neutro Abs: 6.6 10*3/uL (ref 1.7–7.7)
Neutro Abs: 6.2 10*3/uL (ref 1.7–7.7)
Neutrophils Relative %: 83 %
Neutrophils Relative %: 83 %
Platelets: 91 10*3/uL — ABNORMAL LOW (ref 150–400)
Platelets: 104 10*3/uL — ABNORMAL LOW (ref 150–400)
RBC: 3.46 MIL/uL — ABNORMAL LOW (ref 3.87–5.11)
RBC: 3.26 MIL/uL — ABNORMAL LOW (ref 3.87–5.11)
RDW: 14.1 % (ref 11.5–15.5)
RDW: 14.1 % (ref 11.5–15.5)
WBC: 7.9 10*3/uL (ref 4.0–10.5)
WBC: 7.5 10*3/uL (ref 4.0–10.5)
nRBC: 0 % (ref 0.0–0.2)
nRBC: 0 % (ref 0.0–0.2)

## 2020-01-11 LAB — GLUCOSE, CAPILLARY
Glucose-Capillary: 100 mg/dL — ABNORMAL HIGH (ref 70–99)
Glucose-Capillary: 102 mg/dL — ABNORMAL HIGH (ref 70–99)
Glucose-Capillary: 155 mg/dL — ABNORMAL HIGH (ref 70–99)
Glucose-Capillary: 157 mg/dL — ABNORMAL HIGH (ref 70–99)
Glucose-Capillary: 79 mg/dL (ref 70–99)
Glucose-Capillary: 98 mg/dL (ref 70–99)

## 2020-01-11 LAB — MAGNESIUM
Magnesium: 2.2 mg/dL (ref 1.7–2.4)
Magnesium: 2.3 mg/dL (ref 1.7–2.4)

## 2020-01-11 LAB — PHOSPHORUS
Phosphorus: 1.8 mg/dL — ABNORMAL LOW (ref 2.5–4.6)
Phosphorus: 2.3 mg/dL — ABNORMAL LOW (ref 2.5–4.6)

## 2020-01-11 LAB — URINE CULTURE: Culture: 10000 — AB

## 2020-01-11 MED ORDER — POTASSIUM & SODIUM PHOSPHATES 280-160-250 MG PO PACK
2.0000 | PACK | Freq: Once | ORAL | Status: DC
  Filled 2020-01-11: qty 2

## 2020-01-11 MED ORDER — SENNA 8.6 MG PO TABS: 1.0000 | ORAL_TABLET | Freq: Two times a day (BID) | ORAL | Status: DC

## 2020-01-11 MED ORDER — FAMOTIDINE 40 MG/5ML PO SUSR: 10.0000 mg | Freq: Every day | ORAL | Status: DC

## 2020-01-11 MED ORDER — SENNA 8.6 MG PO TABS: 2.0000 | ORAL_TABLET | Freq: Two times a day (BID) | ORAL | Status: DC

## 2020-01-11 MED ORDER — ORAL CARE MOUTH RINSE
15.0000 mL | Freq: Two times a day (BID) | OROMUCOSAL | Status: DC
  Administered 2020-01-11 – 2020-02-17 (×52): 15 mL via OROMUCOSAL

## 2020-01-11 MED ORDER — POLYETHYLENE GLYCOL 3350 17 G PO PACK: 17.0000 g | PACK | Freq: Two times a day (BID) | ORAL | Status: DC

## 2020-01-11 MED ORDER — POTASSIUM PHOSPHATES 15 MMOLE/5ML IV SOLN
10.0000 mmol | Freq: Once | INTRAVENOUS | Status: AC
  Administered 2020-01-11: 14:00:00 10 mmol via INTRAVENOUS
  Filled 2020-01-11: qty 3.33

## 2020-01-11 NOTE — Procedures (Signed)
Extubation Procedure Note  Patient Details:   Name: DANISE DEHNE DOB: 1946/03/24 MRN: [id number]   Airway Documentation:    Vent end date: 01/11/20 Vent end time: 1057   Evaluation  O2 sats: stable throughout Complications: No apparent complications Patient did tolerate procedure well. Bilateral Breath Sounds: Diminished   Yes   Pt extubated per to 2L [place] per MD order. Pt able to vocalize and has weak NPC. Pt encouraged to use yankeur to clear secretions.  Vilinda Blanks 01/11/2020, 10:58 AM

## 2020-01-11 NOTE — Progress Notes (Addendum)
NAME:  Cindy Burns, MRN:  [id number], DOB:  October 12, 1945, LOS: 3 ADMISSION DATE:  01/08/2020, CONSULTATION DATE:  01/08/20 REFERRING MD:  EDP, CHIEF COMPLAINT:  SHOB   Brief History   Cindy Burns is a 74 y.o female with a hx of breast cancer, COPD, MDD, and DM who presented to [facility] with SOB. She was found to have acute renal failure with a significant high anion gap metabolic acidosis and hyperkalemia. She was subsequently transferred to [facility] for HD and further evaluation/management.  History of present illness   Cindy Burns is a 74 y.o female with a hx of breast cancer, COPD, MDD, and DM who presented to [facility] with SOB. Patient reportly had 4 days of worsening SOB with her chronic cough. She called EMS because her symptoms had progressed. Fire department arrived to find the patient hypoxic and she was placed on NRB. By the time EMS arrived the patient was able to be transitioned to 3L/min [place]. On arrival to ED, requiring Bipap.  Unable to tolerate bipap and subsequently intubated with significant metabolic acidosis. U/a positive .  Urine culture, blood culture sent. Central line placed in left IJ.  Started on vanc, ceft.    Transferred to [place] with last abg 7.08/19/166/ bicarb 3.  Nephrology consulted.  Pending IHD for hyperkalemia, metabolic acidosis.    On admission found to be anemia with hb of 6.9 down from 9.0 at [place] prior to 4L resuscitation.  Also found to have negative urine ketones, positive betahydroxybutyrate.    Past Medical History   Past Medical History:  Diagnosis Date  . Cancer [facility]) 2005   Breast Cancer  . COPD (chronic obstructive pulmonary disease) ([place])   . Depression   . Diabetes mellitus without complication ([place])   . Hyperlipidemia    Significant Hospital Events   4/3 > Admitted for acute renal failure with hyperkalemia, placement of an HD cath   Consults:  Nephrology   Procedures:  4/3 >  left IJ placed at [place], intubation  Significant Diagnostic Tests:   CXR 4/3:  1. No acute cardiopulmonary disease. 2. Lung hyperexpansion consistent with COPD.  Renal Ultrasound 4/4:  1. No acute findings.  No hydronephrosis. 2. Relative small right kidney with borderline increased renal parenchymal echogenicity consistent medical renal disease. 3. Left renal cyst.  Micro Data:  SARs COVID > Negative 4/3 Blood cultures > Pending  Urine culture > Pending   Antimicrobials:  Ceftriaxone 4/3 >> Vancomycin 4/3-4/4  Interim history/subjective:   No overnight events noted. She continues to have improving urine output. She remained afebrile and hemodynamically stable.   This AM, she was transitioned to CPAP/PSV.   Objective   Blood pressure (!) 181/70, pulse 84, temperature (!) 97.3 F (36.3 C), resp. rate (!) 25, weight 53.3 kg, SpO2 98 %.    Vent Mode: CPAP;PSV FiO2 (%):  [30 %-40 %] 30 % Set Rate:  [28 bmp] 28 bmp Vt Set:  [400 mL] 400 mL PEEP:  [5 cmH20] 5 cmH20 Pressure Support:  [5 cmH20] 5 cmH20 Plateau Pressure:  [15 cmH20-17 cmH20] 17 cmH20   Intake/Output Summary (Last 24 hours) at 01/11/2020 0752 Last data filed at 01/11/2020 0700 Gross per 24 hour  Intake 2701.68 ml  Output 2817 ml  Net -115.32 ml   Filed Weights   01/09/20 0500 01/10/20 0100 01/11/20 0500  Weight: 52.9 kg 54.5 kg 53.3 kg   Physical Exam Vitals and nursing note reviewed.  HENT:     Head: Normocephalic and atraumatic.  Cardiovascular:     Rate and Rhythm: Regular rhythm. Tachycardia present.     Heart sounds: No murmur.  Pulmonary:     Effort: Pulmonary effort is normal. No respiratory distress.     Breath sounds: No wheezing or rales.  Abdominal:     General: There is no distension.     Palpations: Abdomen is soft.     Tenderness: There is no abdominal tenderness.  Musculoskeletal:     Right lower leg: No edema.     Left lower leg: No edema.  Skin:    General: Skin is warm and  dry.  Neurological:     General: No focal deficit present.     Mental Status: She is alert.     Comments: Minimally sedated, following all commands.   Psychiatric:        Mood and Affect: Mood normal.        Behavior: Behavior normal.     Resolved Hospital Problem list   Hypermagnesemia  Hyperphosphatemia  Hypocalcemia  Assessment & Plan:   Ketoacidosis Likely combination of diabetic and starvation In the setting of medication non-compliance with poor social support. Metabolic acidosis resolved a tthis time with AG back WNL.  - Continue SSI (sensitive) + Levemir 5 units BID   Acute Respiratory Failure Patient presented with SOB and required Bipap on arrival to [facility] ED. Due to inability to tolerate Bipap, she was subsequently intubated for airway protection. Secondary to severe metabolic acidosis.  - Transitioned to CPAP/PSV this AM - Spontaneous breathing trials daily  - Extubation today  Acute Renal Failure  Normal creatinine last noted in October 2020. Differential includes hypovolemia in light of severe DKA versus nephrotic syndrome, likely from longstanding HTN or DM2. Renal ultrasound shows right kidney with changes consistent with chronic renal disease, but no obstruction or hydronephrosis to explain acute failure. - Nephrology following; we appreciate their recommendations  - Discontinued CRRT today  Acute Normocytic Anemia Initial hemoglobin of 9.0, however likely hemo-concentrated. Upon fluid resuscitation, hemoglobin dropped to 6.8. Iron panel does not show evidence of iron deficiency. Reliability of results in an acute process is variable though. Folate and B12 normal. Required 2 pRBCs during admission thus far. Hemoglobin improving at this time.  - CBC BID  Urinary Tract Infection  Urinalysis at [place] showed many bacteria with large leukocytes. She was started on Vancomycin and Ceftriaxone. Vancomycin discontinued as MRSA unlikely and patient has severe AKI.  Culture currently growing E.coli although lower quantity of colonies; susceptibilities pending  - Ceftriaxone x 7 days, currently Day 4/7 - Follow culture susceptibilities   COPD - No wheezing noted on examination  - Duonebs q6h  Hypertension Improved today.  - Labetalol 5 mg for BP > 180/110  Hypophosphoremia  - Phos-nak ordered  Best practice:  Diet: Starting tube feeds today Pain/Anxiety/Delirium protocol (if indicated): Fentanyl + Precedex PRN VAP protocol (if indicated): Per protocol DVT prophylaxis: Heparin  GI prophylaxis: Protonix changed to Pepcid today Glucose control: SSI + Levemir  Mobility: Bed rest Bowel Regimen: Miralax + Senna  Code Status: Full Family Communication: Niece updated on 4/6, patient gave verbal consent to update niece.  Disposition: ICU  Labs   CBC: Recent Labs  Lab 01/08/20 1412 01/08/20 1412 01/08/20 2220 01/08/20 2220 01/08/20 2241 01/08/20 2356 01/09/20 0940 01/09/20 1314 01/09/20 2033 01/10/20 0737 01/10/20 1651  WBC 18.4*  --  13.4*  --  14.6*  --  12.1*  --   --   --  9.8  NEUTROABS 17.5*  --   --   --   --   --   --   --   --   --  8.6*  HGB 9.0*   < > 6.8*   < > 6.9*   < > 8.0*  8.0* 6.7* 9.4* 9.9* 10.6*  HCT 26.1*   < > 20.5*   < > 20.5*   < > 22.9*  22.8* 19.5* 26.3* 29.3* 31.3*  MCV 83.1  --  86.1  --  85.1  --  80.9  --   --   --  86.5  PLT 330  --  216  --  239  --  156  --   --   --  93*   < > = values in this interval not displayed.    Basic Metabolic Panel: Recent Labs  Lab 01/08/20 2210 01/08/20 2356 01/09/20 1413 01/09/20 2033 01/10/20 0737 01/10/20 1651 01/11/20 0527  NA 132*   < > 135 135 137 134* 136  K 4.3   < > 3.6 3.7 4.3 4.1 3.9  CL 98   < > 99 101 102 98 101  CO2 8*   < > 18* 23 24 24 25   GLUCOSE 323*   < > 110* 118* 101* 106* 121*  BUN 196*   < > 93* 53* 26* 21 21  CREATININE 13.36*   < > 5.88* 4.08* 2.67* 2.18* 1.72*  CALCIUM 5.6*   < > 6.8* 6.9* 7.3* 7.2* 7.3*  MG 1.2*  --   --   --   2.2 2.2 2.2  PHOS >30.0*   < > 4.9* 3.3 2.6 2.2*  2.3* 1.9*  1.8*   < > = values in this interval not displayed.   GFR: Estimated Creatinine Clearance: 23.7 mL/min (A) (by C-G formula based on SCr of 1.72 mg/dL (H)). Recent Labs  Lab 01/08/20 1412 01/08/20 1412 01/08/20 1811 01/08/20 2220 01/08/20 2241 01/09/20 0247 01/09/20 0940 01/10/20 1651  WBC 18.4*   < >  --  13.4* 14.6*  --  12.1* 9.8  LATICACIDVEN 1.1  --  3.7* 2.1*  --  1.2  --   --    < > = values in this interval not displayed.    Liver Function Tests: Recent Labs  Lab 01/08/20 1412 01/08/20 1412 01/08/20 2210 01/09/20 0940 01/09/20 1413 01/09/20 2033 01/10/20 0737 01/10/20 1651 01/11/20 0527  AST 17  --  18  --   --   --   --   --   --   ALT 26  --  21  --   --   --   --   --   --   ALKPHOS 120  --  87  --   --   --   --   --   --   BILITOT 0.9  --  0.8  --   --   --   --   --   --   PROT 8.2*  --  5.3*  --   --   --   --   --   --   ALBUMIN 3.5   < > 2.1*   < > 2.3* 2.2* 2.3* 2.3* 2.0*   < > = values in this interval not displayed.   No results for input(s): LIPASE, AMYLASE in the last 168 hours. No results for input(s): AMMONIA in the last 168 hours.  ABG    Component Value  Date/Time   PHART 7.114 (LL) 01/08/2020 2356   PCO2ART 29.2 (L) 01/08/2020 2356   PO2ART 177.0 (H) 01/08/2020 2356   HCO3 9.5 (L) 01/08/2020 2356   TCO2 10 (L) 01/08/2020 2356   ACIDBASEDEF 18.0 (H) 01/08/2020 2356   O2SAT 99.0 01/08/2020 2356     Coagulation Profile: Recent Labs  Lab 01/08/20 2345  INR 1.4*    Cardiac Enzymes: Recent Labs  Lab 01/08/20 1622  CKTOTAL 178    HbA1C: Hgb A1c MFr Bld  Date/Time Value Ref Range Status  01/08/2020 11:45 PM 8.5 (H) 4.8 - 5.6 % Final    Comment:    (NOTE) Pre diabetes:          5.7%-6.4% Diabetes:              >6.4% Glycemic control for   <7.0% adults with diabetes   07/29/2019 09:48 AM 6.8 (H) 4.6 - 6.5 % Final    Comment:    Glycemic Control Guidelines  for People with Diabetes:Non Diabetic:  <6%Goal of Therapy: <7%Additional Action Suggested:  >8%    CBG: Recent Labs  Lab 01/10/20 1521 01/10/20 1914 01/10/20 2302 01/11/20 0304 01/11/20 0722  GLUCAP 80 133* 114* 157* 98    Past Medical History  She,  has a past medical history of Cancer ([place]) (2005), COPD (chronic obstructive pulmonary disease) ([place]), Depression, Diabetes mellitus without complication ([place]), and Hyperlipidemia.   Surgical History    Past Surgical History:  Procedure Laterality Date  . BREAST SURGERY  2005  . CATARACT EXTRACTION    . CHOLECYSTECTOMY  2013     Social History   reports that she has been smoking cigarettes. She has a 40.00 pack-year smoking history. She has never used smokeless tobacco. She reports that she does not drink alcohol or use drugs.   Family History   Her family history includes Alzheimer's disease in her mother; Arthritis in her mother; COPD in her brother and brother; Dementia in her father.   Allergies No Known Allergies   Medications Prior to Admission: - Ezetimibe 10 mg daily QD - Gabapentin 800 mg at bedtime  - Glipizide 10 mg QD - Januvia 100 mg QD - Losartan 25 mg QD - Rosuvastatin 20 mg QD  - Symbicort - Venlafaxine 65 mg QD - Albuterol    Dr. Jose Persia Internal Medicine PGY-1  Pager: [phone] 01/11/2020, 7:52 AM     PCCM Attending:   74 yo, breast ca, copd, dmII, admitted for DKA.   Doing well with SBT this morning   BP (!) 185/72 (BP Location: Right Arm)   Pulse (!) 107   Temp 99 F (37.2 C)   Resp 18   Wt 53.3 kg   SpO2 97%   BMI 21.49 kg/m   Gen: elderly fm, chronically ill appearing, on mech vent  Neck: ETT in place  Lungs: BL mechanical ventilated breaths  Heart: RRR s1 s2  Abd: small hernia   Labs reviewed  Gap closed cbgs better   CXR reviewed  A: AHRF on MV  AGMA, DKA ARF, improving On CVVHD, now discontinued by nephrology  History of COPD  HTN   P: Ceftriaxone X7  days and then stop  Scheduled nebs  Liberate from vent today  cvvhd stopped per nephro May need BIPAP post extubation   This patient is critically ill with multiple organ system failure; which, requires frequent high complexity decision making, assessment, support, evaluation, and titration of therapies. This was completed through the application  of advanced monitoring technologies and extensive interpretation of multiple databases. During this encounter critical care time was devoted to patient care services described in this note for 31 minutes.   Garner Nash, DO [facility] 01/11/2020 11:30 AM

## 2020-01-11 NOTE — Progress Notes (Signed)
[name] NEPHROLOGY PROGRESS NOTE  Assessment/ Plan: Pt is a 74 y.o. yo female with COPD, DM, breast cancer, admitted with respiratory failure on mechanical ventilation found to have DKA, UTI and severe AKI requiring CRRT.  #Severe acute kidney injury likely due to severe dehydration, UTI and DKA: UA with possible UTI.   Kidney US with no hydronephrosis relatively small right kidney.  Severely acidemic on presentation therefore CRRT was started. -Volume status acceptable.  Acidosis improved.  Discontinue CRRT today.  Monitor for renal recovery.    #Nephrotic range proteinuria likely diabetic nephropathy: Patient has A1c 8.5.  Needs outpatient follow-up.  #Hyperkalemia: Improved with dialysis.  #Metabolic acidosis: Off sodium bicarbonate.  Improved.  #Acute hypoxic respiratory failure: Plan to extubate today.  Per PCCM.  Discussed with ICU team.  Subjective: More alert today.  Plan to extubate today.  Tolerating CRRT well.  No UF.  Lab improved.  No new event.  Objective Vital signs in last 24 hours: Vitals:   01/11/20 0600 01/11/20 0700 01/11/20 0746 01/11/20 0800  BP: (!) 159/84 (!) 144/61 (!) 181/70   Pulse: 86 79 84   Resp: (!) 28 (!) 28 (!) 25   Temp: (!) 97 F (36.1 C) (!) 97 F (36.1 C) (!) 97.3 F (36.3 C)   TempSrc:    Bladder  SpO2: 99% 100% 98%   Weight:       Weight change: -1.2 kg  Intake/Output Summary (Last 24 hours) at 01/11/2020 0927 Last data filed at 01/11/2020 0900 Gross per 24 hour  Intake 2672.09 ml  Output 2785 ml  Net -112.91 ml       Labs: Basic Metabolic Panel: Recent Labs  Lab 01/10/20 0737 01/10/20 1651 01/11/20 0527  NA 137 134* 136  K 4.3 4.1 3.9  CL 102 98 101  CO2 '24 24 25  ' GLUCOSE 101* 106* 121*  BUN 26* 21 21  CREATININE 2.67* 2.18* 1.72*  CALCIUM 7.3* 7.2* 7.3*  PHOS 2.6 2.2*  2.3* 1.9*  1.8*   Liver Function Tests: Recent Labs  Lab 01/08/20 1412 01/08/20 1412 01/08/20 2210 01/09/20 0940  01/10/20 0737 01/10/20 1651 01/11/20 0527  AST 17  --  18  --   --   --   --   ALT 26  --  21  --   --   --   --   ALKPHOS 120  --  87  --   --   --   --   BILITOT 0.9  --  0.8  --   --   --   --   PROT 8.2*  --  5.3*  --   --   --   --   ALBUMIN 3.5   < > 2.1*   < > 2.3* 2.3* 2.0*   < > = values in this interval not displayed.   No results for input(s): LIPASE, AMYLASE in the last 168 hours. No results for input(s): AMMONIA in the last 168 hours. CBC: Recent Labs  Lab 01/08/20 1412 01/08/20 1412 01/08/20 2220 01/08/20 2220 01/08/20 2241 01/08/20 2356 01/09/20 0940 01/09/20 1314 01/10/20 0737 01/10/20 1651 01/11/20 0527  WBC 18.4*   < > 13.4*   < > 14.6*   < > 12.1*  --   --  9.8 7.9  NEUTROABS 17.5*  --   --   --   --   --   --   --   --  8.6* 6.6  HGB 9.0*   < >  6.8*   < > 6.9*   < > 8.0*  8.0*   < > 9.9* 10.6* 10.0*  HCT 26.1*   < > 20.5*   < > 20.5*   < > 22.9*  22.8*   < > 29.3* 31.3* 29.5*  MCV 83.1   < > 86.1  --  85.1  --  80.9  --   --  86.5 85.3  PLT 330   < > 216   < > 239   < > 156  --   --  93* 91*   < > = values in this interval not displayed.   Cardiac Enzymes: Recent Labs  Lab 01/08/20 1622  CKTOTAL 178   CBG: Recent Labs  Lab 01/10/20 1521 01/10/20 1914 01/10/20 2302 01/11/20 0304 01/11/20 0722  GLUCAP 80 133* 114* 157* 98    Iron Studies:  Recent Labs    01/08/20 2345  IRON 143  TIBC 172*  FERRITIN 702*   Studies/Results: No results found.  Medications: Infusions: .  prismasol BGK 4/2.5 400 mL/hr at 01/11/20 0409  .  prismasol BGK 4/2.5 200 mL/hr at 01/10/20 1644  . sodium chloride 10 mL/hr at 01/11/20 0900  . cefTRIAXone (ROCEPHIN)  IV Stopped (01/10/20 2335)  . dexmedetomidine (PRECEDEX) IV infusion 0.2 mcg/kg/hr (01/11/20 0900)  . famotidine (PEPCID) IV Stopped (01/10/20 2244)  . feeding supplement (VITAL HIGH PROTEIN) 1,000 mL (01/11/20 0734)  . fentaNYL infusion INTRAVENOUS 50 mcg/hr (01/11/20 0900)  . prismasol BGK  4/2.5 1,800 mL/hr at 01/11/20 0812  . sodium chloride      Scheduled Medications: . chlorhexidine gluconate (MEDLINE KIT)  15 mL Mouth Rinse BID  . Chlorhexidine Gluconate Cloth  6 each Topical Q0600  . heparin  5,000 Units Subcutaneous Q8H  . insulin aspart  1-3 Units Subcutaneous Q4H  . insulin detemir  5 Units Subcutaneous Q12H  . ipratropium-albuterol  3 mL Nebulization Q6H  . mouth rinse  15 mL Mouth Rinse 10 times per day  . polyethylene glycol  17 g Oral BID  . senna  1 tablet Oral BID    have reviewed scheduled and prn medications.  Physical Exam: General: Intubated, sedated, following simple commands. Heart:RRR, s1s2 nl, no rubs Lungs: Coarse breath sound bilateral Abdomen:soft, Non-tender, non-distended Extremities:No edema Dialysis Access: Left IJ temporary HD catheter  Cindy Burns 01/11/2020,9:27 AM  LOS: 3 days  Pager: [phone]

## 2020-01-12 DIAGNOSIS — N179 Acute kidney failure, unspecified: Secondary | ICD-10-CM | POA: Diagnosis not present

## 2020-01-12 LAB — CBC WITH DIFFERENTIAL/PLATELET
Abs Immature Granulocytes: 0.05 10*3/uL (ref 0.00–0.07)
Basophils Absolute: 0 10*3/uL (ref 0.0–0.1)
Basophils Relative: 0 %
Eosinophils Absolute: 0.1 10*3/uL (ref 0.0–0.5)
Eosinophils Relative: 1 %
HCT: 28.6 % — ABNORMAL LOW (ref 36.0–46.0)
Hemoglobin: 9.3 g/dL — ABNORMAL LOW (ref 12.0–15.0)
Immature Granulocytes: 1 %
Lymphocytes Relative: 14 %
Lymphs Abs: 0.9 10*3/uL (ref 0.7–4.0)
MCH: 28.9 pg (ref 26.0–34.0)
MCHC: 32.5 g/dL (ref 30.0–36.0)
MCV: 88.8 fL (ref 80.0–100.0)
Monocytes Absolute: 0.4 10*3/uL (ref 0.1–1.0)
Monocytes Relative: 7 %
Neutro Abs: 5 10*3/uL (ref 1.7–7.7)
Neutrophils Relative %: 77 %
Platelets: 90 10*3/uL — ABNORMAL LOW (ref 150–400)
RBC: 3.22 MIL/uL — ABNORMAL LOW (ref 3.87–5.11)
RDW: 14 % (ref 11.5–15.5)
WBC: 6.5 10*3/uL (ref 4.0–10.5)
nRBC: 0 % (ref 0.0–0.2)

## 2020-01-12 LAB — GLUCOSE, CAPILLARY
Glucose-Capillary: 82 mg/dL (ref 70–99)
Glucose-Capillary: 73 mg/dL (ref 70–99)
Glucose-Capillary: 126 mg/dL — ABNORMAL HIGH (ref 70–99)
Glucose-Capillary: 80 mg/dL (ref 70–99)
Glucose-Capillary: 118 mg/dL — ABNORMAL HIGH (ref 70–99)
Glucose-Capillary: 132 mg/dL — ABNORMAL HIGH (ref 70–99)

## 2020-01-12 LAB — RENAL FUNCTION PANEL
Albumin: 2 g/dL — ABNORMAL LOW (ref 3.5–5.0)
Albumin: 2.3 g/dL — ABNORMAL LOW (ref 3.5–5.0)
Anion gap: 16 — ABNORMAL HIGH (ref 5–15)
Anion gap: 14 (ref 5–15)
BUN: 36 mg/dL — ABNORMAL HIGH (ref 8–23)
BUN: 40 mg/dL — ABNORMAL HIGH (ref 8–23)
CO2: 19 mmol/L — ABNORMAL LOW (ref 22–32)
CO2: 21 mmol/L — ABNORMAL LOW (ref 22–32)
Calcium: 7.4 mg/dL — ABNORMAL LOW (ref 8.9–10.3)
Calcium: 7.5 mg/dL — ABNORMAL LOW (ref 8.9–10.3)
Chloride: 104 mmol/L (ref 98–111)
Chloride: 99 mmol/L (ref 98–111)
Creatinine, Ser: 3.64 mg/dL — ABNORMAL HIGH (ref 0.44–1.00)
Creatinine, Ser: 4.07 mg/dL — ABNORMAL HIGH (ref 0.44–1.00)
GFR calc Af Amer: 14 mL/min — ABNORMAL LOW (ref >60)
GFR calc Af Amer: 12 mL/min — ABNORMAL LOW (ref >60)
GFR calc non Af Amer: 12 mL/min — ABNORMAL LOW (ref >60)
GFR calc non Af Amer: 10 mL/min — ABNORMAL LOW (ref >60)
Glucose, Bld: 79 mg/dL (ref 70–99)
Glucose, Bld: 80 mg/dL (ref 70–99)
Phosphorus: 2.8 mg/dL (ref 2.5–4.6)
Phosphorus: 3 mg/dL (ref 2.5–4.6)
Potassium: 3.9 mmol/L (ref 3.5–5.1)
Potassium: 4.2 mmol/L (ref 3.5–5.1)
Sodium: 139 mmol/L (ref 135–145)
Sodium: 134 mmol/L — ABNORMAL LOW (ref 135–145)

## 2020-01-12 LAB — APTT: aPTT: 56 seconds — ABNORMAL HIGH (ref 24–36)

## 2020-01-12 MED ORDER — SENNA 8.6 MG PO TABS
2.0000 | ORAL_TABLET | Freq: Two times a day (BID) | ORAL | Status: DC
  Administered 2020-01-12 – 2020-02-05 (×18): 17.2 mg via ORAL
  Filled 2020-01-12 (×31): qty 2

## 2020-01-12 MED ORDER — SODIUM BICARBONATE 650 MG PO TABS
650.0000 mg | ORAL_TABLET | Freq: Two times a day (BID) | ORAL | Status: DC
  Administered 2020-01-12 – 2020-01-15 (×7): 650 mg via ORAL
  Filled 2020-01-12 (×7): qty 1

## 2020-01-12 MED ORDER — ENSURE ENLIVE PO LIQD
237.0000 mL | Freq: Two times a day (BID) | ORAL | Status: DC
  Administered 2020-01-13 – 2020-01-31 (×31): 237 mL via ORAL

## 2020-01-12 MED ORDER — POLYETHYLENE GLYCOL 3350 17 G PO PACK
17.0000 g | PACK | Freq: Two times a day (BID) | ORAL | Status: DC
  Administered 2020-01-12 – 2020-02-05 (×13): 17 g via ORAL
  Filled 2020-01-12 (×29): qty 1

## 2020-01-12 NOTE — Progress Notes (Signed)
[facility] NEPHROLOG[facility] PROGRESS NOTE  Assessment/ Plan: Pt is a 74 y.o. yo female with COPD, DM, breast cancer, admitted with respiratory failure on mechanical ventilation found to have DKA, UTI and severe AKI requiring CRRT.  #Severe acute kidney injury likely due to severe dehydration, UTI and DKA: UA with possible UTI.   Kidney US with no hydronephrosis relatively small right kidney.  Severely acidemic on presentation therefore CRRT was started till 4/6. Urine output increasing however both BUN and creatinine level trending up.  We will continue to monitor urine output and labs.  No plan for HD today.    #Nephrotic range proteinuria likely diabetic nephropathy: Patient has A1c 8.5.  Needs outpatient follow-up.  #Hyperkalemia: Improved with dialysis.  #Metabolic acidosis: Start oral sodium bicarbonate  #Acute hypoxic respiratory failure: Extubated and on oxygen.  Discussed with ICU team.  Subjective: She is extubated, alert awake and following commands.  Asking water.  Urine output of 822 cc Objective Vital signs in last 24 hours: Vitals:   01/12/20 0400 01/12/20 0500 01/12/20 0600 01/12/20 0726  BP: (!) 152/61 (!) 147/64 (!) 160/71   Pulse: 93 86 88   Resp: 19 19 20    Temp:    98.4 F (36.9 C)  TempSrc:    Oral  SpO2: 96% 94% 96%   Weight:  53.9 kg     Weight change: 0.6 kg  Intake/Output Summary (Last 24 hours) at 01/12/2020 0806 Last data filed at 01/12/2020 2836 Gross per 24 hour  Intake 504.08 ml  Output 919 ml  Net -414.92 ml       Labs: Basic Metabolic Panel: Recent Labs  Lab 01/11/20 0527 01/11/20 1645 01/12/20 0600  NA 136 135 139  K 3.9 4.0 3.9  CL 101 101 104  CO2 25 24 19*  GLUCOSE 121* 96 79  BUN 21 24* 36*  CREATININE 1.72* 2.39* 3.64*  CALCIUM 7.3* 7.3* 7.4*  PHOS 1.9*  1.8* 2.3*  2.4* 2.8   Liver Function Tests: Recent Labs  Lab 01/08/20 1412 01/08/20 1412 01/08/20 2210 01/09/20 0940 01/11/20 0527 01/11/20 1645  01/12/20 0600  AST 17  --  18  --   --   --   --   ALT 26  --  21  --   --   --   --   ALKPHOS 120  --  87  --   --   --   --   BILITOT 0.9  --  0.8  --   --   --   --   PROT 8.2*  --  5.3*  --   --   --   --   ALBUMIN 3.5   < > 2.1*   < > 2.0* 2.1* 2.0*   < > = values in this interval not displayed.   No results for input(s): LIPASE, AMYLASE in the last 168 hours. No results for input(s): AMMONIA in the last 168 hours. CBC: Recent Labs  Lab 01/09/20 0940 01/09/20 1314 01/10/20 1651 01/10/20 1651 01/11/20 0527 01/11/20 1645 01/12/20 0600  WBC 12.1*   < > 9.8   < > 7.9 7.5 6.5  NEUTROABS  --   --  8.6*   < > 6.6 6.2 5.0  HGB 8.0*  8.0*   < > 10.6*   < > 10.0* 9.5* 9.3*  HCT 22.9*  22.8*   < > 31.3*   < > 29.5* 28.9* 28.6*  MCV 80.9  --  86.5  --  85.3 88.7 88.8  PLT 156   < > 93*   < > 91* 104* 90*   < > = values in this interval not displayed.   Cardiac Enzymes: Recent Labs  Lab 01/08/20 1622  CKTOTAL 178   CBG: Recent Labs  Lab 01/11/20 1514 01/11/20 1937 01/11/20 2342 01/12/20 0347 01/12/20 0724  GLUCAP 79 100* 102* 82 73    Iron Studies:  No results for input(s): IRON, TIBC, TRANSFERRIN, FERRITIN in the last 72 hours. Studies/Results: No results found.  Medications: Infusions: . sodium chloride Stopped (01/11/20 1401)  . cefTRIAXone (ROCEPHIN)  IV Stopped (01/12/20 0027)  . dexmedetomidine (PRECEDEX) IV infusion Stopped (01/11/20 1056)  . feeding supplement (VITAL HIGH PROTEIN) Stopped (01/11/20 1050)  . fentaNYL infusion INTRAVENOUS Stopped (01/11/20 1056)  . sodium chloride      Scheduled Medications: . Chlorhexidine Gluconate Cloth  6 each Topical Q0600  . famotidine  10.4 mg Per Tube QHS  . heparin  5,000 Units Subcutaneous Q8H  . insulin aspart  1-3 Units Subcutaneous Q4H  . insulin detemir  5 Units Subcutaneous Q12H  . ipratropium-albuterol  3 mL Nebulization Q6H  . mouth rinse  15 mL Mouth Rinse BID  . polyethylene glycol  17 g Per  Tube BID  . senna  2 tablet Per Tube BID    have reviewed scheduled and prn medications.  Physical Exam: General: Extubated, alert awake and following commands. Heart:RRR, s1s2 nl, no rubs Lungs: Clear bilateral, no wheezing Abdomen:soft, Non-tender, non-distended Extremities:No edema Dialysis Access: Left IJ temporary HD catheter  Cindy Burns 01/12/2020,8:06 AM  LOS: 4 days  Pager: [phone]

## 2020-01-12 NOTE — Evaluation (Signed)
Clinical/Bedside Swallow Evaluation Patient Details  Name: Cindy Burns MRN: [id number] Date of Birth: [birth year]  Today's Date: 01/12/2020 Time: SLP Start Time (ACUTE ONLY): 7858 SLP Stop Time (ACUTE ONLY): 0910 SLP Time Calculation (min) (ACUTE ONLY): 17 min  Past Medical History:  Past Medical History:  Diagnosis Date  . Cancer [facility]) 2005   Breast Cancer  . COPD (chronic obstructive pulmonary disease) ([place])   . Depression   . Diabetes mellitus without complication ([place])   . Hyperlipidemia    Past Surgical History:  Past Surgical History:  Procedure Laterality Date  . BREAST SURGERY  2005  . CATARACT EXTRACTION    . CHOLECYSTECTOMY  2013   HPI:  Cindy Burns is a 74 y.o female with a hx of breast cancer, COPD, MDD, and DM who presented to [facility] with SOB. She was found to have acute renal failure with a significant high anion gap metabolic acidosis and hyperkalemia. She was subsequently transferred to [facility] cone for HD and further evaluation/management.   Assessment / Plan / Recommendation Clinical Impression  Pt is s/p 3.5 day intubation with adequate vocal quality and intensity. Cough is mild-moderately weak on command. Impacting her aspiration risk presently is her history of COPD paired with recent intubation although the only sign of airway compromise was thin liquid post cracker. Continuous cup and straw sips water consumed without outward signs aspiration. Upper denture in place on arrival and therapist donned lower plate to allow funtional mastication. Will initiated Dys 3 (mechanical soft) initially, thin liquids, straws allowed and meds whole in puree. Decreased hand strength and coordination and will need assist with meals. ST will follow pt.    SLP Visit Diagnosis: Dysphagia, unspecified (R13.10)    Aspiration Risk  Mild aspiration risk    Diet Recommendation Dysphagia 3 (Mech soft);Thin liquid   Liquid Administration via:  Straw;Cup Medication Administration: Whole meds with puree Supervision: Patient able to self feed;Staff to assist with self feeding;Full supervision/cueing for compensatory strategies Compensations: Slow rate;Small sips/bites Postural Changes: Seated upright at 90 degrees    Other  Recommendations Oral Care Recommendations: Oral care BID   Follow up Recommendations None      Frequency and Duration min 2x/week  2 weeks       Prognosis Prognosis for Safe Diet Advancement: Good      Swallow Study   General HPI: Cindy Burns is a 74 y.o female with a hx of breast cancer, COPD, MDD, and DM who presented to [facility] with SOB. She was found to have acute renal failure with a significant high anion gap metabolic acidosis and hyperkalemia. She was subsequently transferred to [facility] for HD and further evaluation/management. Type of Study: Bedside Swallow Evaluation Previous Swallow Assessment: none Diet Prior to this Study: NPO Temperature Spikes Noted: No Respiratory Status: Nasal cannula History of Recent Intubation: Yes Length of Intubations (days): (3 1/2) Date extubated: 01/11/20 Behavior/Cognition: Alert;Cooperative;Pleasant mood Oral Cavity Assessment: Within Functional Limits Oral Care Completed by SLP: No Oral Cavity - Dentition: Dentures, top;Dentures, bottom Vision: Functional for self-feeding Self-Feeding Abilities: Needs assist Patient Positioning: Upright in bed Baseline Vocal Quality: Normal Volitional Cough: Weak Volitional Swallow: Able to elicit    Oral/Motor/Sensory Function Overall Oral Motor/Sensory Function: Within functional limits   Ice Chips Ice chips: Not tested   Thin Liquid Thin Liquid: Impaired Presentation: Cup;Straw Pharyngeal  Phase Impairments: Cough - Immediate    Nectar Thick Nectar Thick Liquid: Not tested   Honey Thick  Honey Thick Liquid: Not tested   Puree Puree: Within functional limits   Solid     Solid:  Within functional limits      Houston Siren 01/12/2020,9:34 AM  Orbie Pyo Colvin Caroli.Ed Risk analyst [phone] Office [phone]

## 2020-01-12 NOTE — Progress Notes (Addendum)
Nutrition Follow-up  DOCUMENTATION CODES:   Not applicable  INTERVENTION:   Ensure Enlive po BID, each supplement provides 350 kcal and 20 grams of protein  NUTRITION DIAGNOSIS:   Inadequate oral intake related to inability to eat as evidenced by NPO status.  Ongoing, diet just advanced today  GOAL:   Patient will meet greater than or equal to 90% of their needs  Unmet  MONITOR:   PO intake, Supplement acceptance, Skin  ASSESSMENT:   74 yo female admitted with respiratory failure, DKA, UTI, AKI. Intubated on admission. PMH includes COPD, DM, breast cancer.  4/6: Extubated and CRRT stopped.  Bedside swallow evaluation with SLP today. Diet advanced to dysphagia 3 with thin liquids. Lunch today will be first meal. Suspect intake will be poor at least initially. Will add PO supplements.  Labs reviewed. BUN 36 (H), creatinine 3.64 (H) CBG's: 82-73-126  Medications reviewed and include novolog, miralax, senokot, sodium bicarb tablet.   Weight stable, 53.9 kg today.  Diet Order:   Diet Order            DIET DYS 3 Room service appropriate? Yes with Assist; Fluid consistency: Thin  Diet effective now              EDUCATION NEEDS:   Not appropriate for education at this time  Skin:  Skin Assessment: Skin Integrity Issues: Skin Integrity Issues:: DTI, Stage II, Other (Comment) DTI: L heel Stage II: coccyx Other: wound to L great toe; MASD to groin, perineum, buttocks  Last BM:  no BM documented  Height:   Ht Readings from Last 1 Encounters:  01/08/20 5\' 2"  (1.575 m)    Weight:   Wt Readings from Last 1 Encounters:  01/12/20 53.9 kg    Ideal Body Weight:  50 kg  BMI:  Body mass index is 21.73 kg/m.  Estimated Nutritional Needs:   Kcal:  1400-1600  Protein:  80-90 gm  Fluid:  >/= 1.5 L    Molli Barrows, RD, LDN, CNSC Please refer to Amion for contact information.

## 2020-01-12 NOTE — Evaluation (Signed)
Occupational Therapy Evaluation Patient Details Name: Cindy Burns MRN: [id number] DOB: 03-17-1946 Today's Date: 01/12/2020    History of Present Illness Pt is a 74 year old woman admitted on 01/08/20 to [facility] and subsequently transferred to [facility] with respiratory failure requring intubation, DKA, UTI, hyperkalemia and renal failure requiring CRRT. Extubated 01/11/20. PMH: COPD, breast cancer, Dm, MDD.   Clinical Impression   Pt was independent and living alone prior to admission. She was awakened for therapy, but lethargic with eyes closed until she was positioned in sitting at EOB. Pt demonstrates global weakness, fair static sitting balance and inability to stand. She requires 2 person assist to squat-pivot to chair. She is dependent in all ADL.  Pt with minimal verbalization this visit, difficult to accurately assess cognition, but needs multimodal cues and increased time to follow simple commands. Pt with stable VS on RA throughout session. Pt will need post acute rehab in SNF. Will follow acutely.    Follow Up Recommendations  SNF;Supervision/Assistance - 24 hour    Equipment Recommendations  Other (comment)(defer to next venue)    Recommendations for Other Services       Precautions / Restrictions Precautions Precautions: Fall Restrictions Weight Bearing Restrictions: No      Mobility Bed Mobility Overal bed mobility: Needs Assistance Bed Mobility: Supine to Sit     Supine to sit: Total assist     General bed mobility comments: assisted for all aspects, no initiation, but became more alert once in sitting  Transfers Overall transfer level: Needs assistance   Transfers: Squat Pivot Transfers     Squat pivot transfers: +2 physical assistance;Total assist     General transfer comment: pt unable to bear weight, squat pivoted to chair, recommended nursing assist her back with maxisky    Balance Overall balance assessment: Needs assistance   Sitting balance-Leahy  Scale: Fair Sitting balance - Comments: able to sit with trunk away from back of chair once placed                                   ADL either performed or assessed with clinical judgement   ADL                                         General ADL Comments: requires total assist, incontinent of bowel, cleaned up     Vision         Perception     Praxis      Pertinent Vitals/Pain Pain Assessment: Faces Faces Pain Scale: No hurt     Hand Dominance Right   Extremity/Trunk Assessment Upper Extremity Assessment Upper Extremity Assessment: Generalized weakness(did not demonstrate any functional use)   Lower Extremity Assessment Lower Extremity Assessment: Defer to PT evaluation   Cervical / Trunk Assessment Cervical / Trunk Assessment: (flexed posture, forward head)   Communication Communication Communication: Expressive difficulties(minimally verbal)   Cognition Arousal/Alertness: Lethargic Behavior During Therapy: Flat affect Overall Cognitive Status: Impaired/Different from baseline Area of Impairment: Following commands;Attention                   Current Attention Level: Focused   Following Commands: Follows one step commands with increased time;Follows one step commands inconsistently           General Comments  Exercises     Shoulder Instructions      Home Living Family/patient expects to be discharged to:: Skilled nursing facility Living Arrangements: Alone                                      Prior Functioning/Environment Level of Independence: Independent        Comments: reports driving PTA        OT Problem List: Decreased strength;Decreased activity tolerance;Impaired balance (sitting and/or standing);Decreased cognition;Decreased coordination;Decreased safety awareness;Decreased knowledge of use of DME or AE;Impaired UE functional use      OT Treatment/Interventions:  Self-care/ADL training;Therapeutic exercise;DME and/or AE instruction;Therapeutic activities;Cognitive remediation/compensation;Patient/family education;Balance training    OT Goals(Current goals can be found in the care plan section) Acute Rehab OT Goals Patient Stated Goal: agreeable to rehab OT Goal Formulation: Patient unable to participate in goal setting Time For Goal Achievement: 01/26/20 Potential to Achieve Goals: Fair ADL Goals Pt Will Perform Eating: with min assist;sitting(when cleared for PO) Pt Will Perform Grooming: sitting;with mod assist Pt Will Perform Upper Body Dressing: with mod assist;sitting Pt Will Transfer to Toilet: with mod assist;stand pivot transfer Pt/caregiver will Perform Home Exercise Program: Increased strength;Both right and left upper extremity;With minimal assist Additional ADL Goal #1: Pt will perform bed mobility with moderate assistance in preparation for ADL. Additional ADL Goal #2: Pt will follow one step commands with 50% accuracy.  OT Frequency: Min 2X/week   Barriers to D/C: Decreased caregiver support          Co-evaluation PT/OT/SLP Co-Evaluation/Treatment: Yes Reason for Co-Treatment: For patient/therapist safety   OT goals addressed during session: Strengthening/ROM;ADL's and self-care      AM-PAC OT "6 Clicks" Daily Activity     Outcome Measure Help from another person eating meals?: Total Help from another person taking care of personal grooming?: Total Help from another person toileting, which includes using toliet, bedpan, or urinal?: Total Help from another person bathing (including washing, rinsing, drying)?: Total Help from another person to put on and taking off regular upper body clothing?: Total Help from another person to put on and taking off regular lower body clothing?: Total 6 Click Score: 6   End of Session Nurse Communication: Mobility status;Need for lift equipment  Activity Tolerance: Patient limited by  lethargy(VSS on RA) Patient left: in chair;with call bell/phone within reach;with chair alarm set  OT Visit Diagnosis: Muscle weakness (generalized) (M62.81)                Time: 1510-1535 OT Time Calculation (min): 25 min Charges:  OT General Charges $OT Visit: 1 Visit OT Evaluation $OT Eval Moderate Complexity: 1 Mod  Nestor Lewandowsky, OTR/L Acute Rehabilitation Services Pager: [phone] Office: [phone]  Cindy Burns 01/12/2020, 3:58 PM

## 2020-01-12 NOTE — Progress Notes (Addendum)
NAME:  Cindy Burns, MRN:  [id number], DOB:  04-07-1946, LOS: 4 ADMISSION DATE:  01/08/2020, CONSULTATION DATE:  01/08/20 REFERRING MD:  EDP, CHIEF COMPLAINT:  SHOB   Brief History   Cindy Burns is a 74 y.o female with a hx of breast cancer, COPD, MDD, and DM who presented to [facility] with SOB. She was found to have acute renal failure with a significant high anion gap metabolic acidosis and hyperkalemia. She was subsequently transferred to [facility] cone for HD and further evaluation/management.  History of present illness   Cindy Burns is a 74 y.o female with a hx of breast cancer, COPD, MDD, and DM who presented to [facility] with SOB. Patient reportly had 4 days of worsening SOB with her chronic cough. She called EMS because her symptoms had progressed. Fire department arrived to find the patient hypoxic and she was placed on NRB. By the time EMS arrived the patient was able to be transitioned to 3L/min [place]. On arrival to ED, requiring Bipap.  Unable to tolerate bipap and subsequently intubated with significant metabolic acidosis. U/a positive .  Urine culture, blood culture sent. Central line placed in left IJ.  Started on vanc, ceft.    Transferred to [place] with last abg 7.08/19/166/ bicarb 3.  Nephrology consulted.  Pending IHD for hyperkalemia, metabolic acidosis.    On admission found to be anemia with hb of 6.9 down from 9.0 at [place] prior to 4L resuscitation.  Also found to have negative urine ketones, positive betahydroxybutyrate.    Past Medical History   Past Medical History:  Diagnosis Date  . Cancer [facility]) 2005   Breast Cancer  . COPD (chronic obstructive pulmonary disease) ([place])   . Depression   . Diabetes mellitus without complication ([place])   . Hyperlipidemia    Significant Hospital Events   4/3 > Admitted for acute renal failure with hyperkalemia, placement of an HD cath 4/6 > Extubated, CRRT stopped   Consults:   Nephrology   Procedures:  4/3 > left IJ placed at [place], intubation  Significant Diagnostic Tests:   CXR 4/3:  1. No acute cardiopulmonary disease. 2. Lung hyperexpansion consistent with COPD.  Renal Ultrasound 4/4:  1. No acute findings.  No hydronephrosis. 2. Relative small right kidney with borderline increased renal parenchymal echogenicity consistent medical renal disease. 3. Left renal cyst.  Micro Data:  SARs COVID > Negative 4/3 Blood cultures > Pending  Urine culture > Pending   Antimicrobials:  Ceftriaxone 4/3 >> Vancomycin 4/3-4/4  Interim history/subjective:   No overnight events noted. She continues to have improving urine output. She remained afebrile and hemodynamically stable.   This AM, she was transitioned to CPAP/PSV.   Objective   Blood pressure (!) 159/74, pulse 89, temperature 98.4 F (36.9 C), temperature source Oral, resp. rate 18, weight 53.9 kg, SpO2 97 %.        Intake/Output Summary (Last 24 hours) at 01/12/2020 1043 Last data filed at 01/12/2020 1000 Gross per 24 hour  Intake 480.22 ml  Output 730 ml  Net -249.78 ml   Filed Weights   01/10/20 0100 01/11/20 0500 01/12/20 0500  Weight: 54.5 kg 53.3 kg 53.9 kg   Physical Exam Vitals and nursing note reviewed.  HENT:     Head: Normocephalic and atraumatic.  Cardiovascular:     Rate and Rhythm: Regular rhythm. Tachycardia present.     Heart sounds: No murmur.  Pulmonary:     Effort:  Pulmonary effort is normal. No respiratory distress.     Breath sounds: No wheezing or rales.  Abdominal:     General: There is no distension.     Palpations: Abdomen is soft.     Tenderness: There is no abdominal tenderness.  Musculoskeletal:     Right lower leg: No edema.     Left lower leg: No edema.  Skin:    General: Skin is warm and dry.  Neurological:     General: No focal deficit present.     Mental Status: She is alert.     Comments: Minimally sedated, following all commands.    Psychiatric:        Mood and Affect: Mood normal.        Behavior: Behavior normal.     Resolved Hospital Problem list   Hypermagnesemia  Hyperphosphatemia Hypocalcemia  Assessment & Plan:   Ketoacidosis Likely combination of diabetic and starvation In the setting of medication non-compliance with poor social support. Metabolic acidosis resolved a tthis time with AG back WNL.  - Continue SSI (sensitive)  Acute Respiratory Failure Patient presented with SOB and required Bipap on arrival to [facility] ED. Due to inability to tolerate Bipap, she was subsequently intubated for airway protection. - Secondary to severe metabolic acidosis.  - Continue supplemental oxygen PRN. Currently at 2L and saturating well.   Acute Renal Failure  Normal creatinine last noted in October 2020. Differential includes hypovolemia in light of severe DKA versus nephrotic syndrome, likely from longstanding HTN or DM2.  - Nephrology following; we appreciate their recommendations - Increased creatinine and BUN noted this AM but continues urine output - No HD planned for today.  - Strict in/outs - Monitor renal function   Acute Normocytic Anemia Required 2 pRBCs during admission thus far. Hemoglobin improving for +48 hours.  - CBC daily  Urinary Tract Infection  Urinalysis at [facility] showed many bacteria with large leukocytes. She was started on Vancomycin and Ceftriaxone. Vancomycin discontinued as MRSA unlikely and patient has severe AKI. Culture currently growing E.coli although lower quantity of colonies; susceptibilities pending  - Ceftriaxone x 7 days, currently Day 4/7 - Follow culture susceptibilities   COPD - No wheezing noted on examination  - Duonebs q6h  Hypertension Continues to be elevated. She has required 1 dose of Labetalol yesterday and once today.  - Labetalol 5 mg for BP > 180/110  Best practice:  Diet: Dysphagia 3 diet  Pain/Anxiety/Delirium protocol (if indicated): N/A VAP  protocol (if indicated): N/A DVT prophylaxis: Heparin  GI prophylaxis: N/A Glucose control: SSI Mobility: PT/OT Bowel Regimen: Miralax + Senna  Code Status: Full Family Communication: Niece last updated on 4/6 Disposition: Transfer to floor today  Labs   CBC: Recent Labs  Lab 01/08/20 1412 01/08/20 2220 01/09/20 0940 01/09/20 1314 01/10/20 0737 01/10/20 1651 01/11/20 0527 01/11/20 1645 01/12/20 0600  WBC 18.4*   < > 12.1*  --   --  9.8 7.9 7.5 6.5  NEUTROABS 17.5*  --   --   --   --  8.6* 6.6 6.2 5.0  HGB 9.0*   < > 8.0*  8.0*   < > 9.9* 10.6* 10.0* 9.5* 9.3*  HCT 26.1*   < > 22.9*  22.8*   < > 29.3* 31.3* 29.5* 28.9* 28.6*  MCV 83.1   < > 80.9  --   --  86.5 85.3 88.7 88.8  PLT 330   < > 156  --   --  93* 91*  104* 90*   < > = values in this interval not displayed.    Basic Metabolic Panel: Recent Labs  Lab 01/08/20 2210 01/08/20 2356 01/10/20 0737 01/10/20 1651 01/11/20 0527 01/11/20 1645 01/12/20 0600  NA 132*   < > 137 134* 136 135 139  K 4.3   < > 4.3 4.1 3.9 4.0 3.9  CL 98   < > 102 98 101 101 104  CO2 8*   < > 24 24 25 24  19*  GLUCOSE 323*   < > 101* 106* 121* 96 79  BUN 196*   < > 26* 21 21 24* 36*  CREATININE 13.36*   < > 2.67* 2.18* 1.72* 2.39* 3.64*  CALCIUM 5.6*   < > 7.3* 7.2* 7.3* 7.3* 7.4*  MG 1.2*  --  2.2 2.2 2.2 2.3  --   PHOS >30.0*   < > 2.6 2.2*  2.3* 1.9*  1.8* 2.3*  2.4* 2.8   < > = values in this interval not displayed.   GFR: Estimated Creatinine Clearance: 11.2 mL/min (A) (by C-G formula based on SCr of 3.64 mg/dL (H)). Recent Labs  Lab 01/08/20 1412 01/08/20 1412 01/08/20 1811 01/08/20 2220 01/08/20 2241 01/09/20 0247 01/09/20 0940 01/10/20 1651 01/11/20 0527 01/11/20 1645 01/12/20 0600  WBC 18.4*   < >  --  13.4*   < >  --    < > 9.8 7.9 7.5 6.5  LATICACIDVEN 1.1  --  3.7* 2.1*  --  1.2  --   --   --   --   --    < > = values in this interval not displayed.    Liver Function Tests: Recent Labs  Lab  01/08/20 1412 01/08/20 1412 01/08/20 2210 01/09/20 0940 01/10/20 0737 01/10/20 1651 01/11/20 0527 01/11/20 1645 01/12/20 0600  AST 17  --  18  --   --   --   --   --   --   ALT 26  --  21  --   --   --   --   --   --   ALKPHOS 120  --  87  --   --   --   --   --   --   BILITOT 0.9  --  0.8  --   --   --   --   --   --   PROT 8.2*  --  5.3*  --   --   --   --   --   --   ALBUMIN 3.5   < > 2.1*   < > 2.3* 2.3* 2.0* 2.1* 2.0*   < > = values in this interval not displayed.   No results for input(s): LIPASE, AMYLASE in the last 168 hours. No results for input(s): AMMONIA in the last 168 hours.  ABG    Component Value Date/Time   PHART 7.114 (LL) 01/08/2020 2356   PCO2ART 29.2 (L) 01/08/2020 2356   PO2ART 177.0 (H) 01/08/2020 2356   HCO3 9.5 (L) 01/08/2020 2356   TCO2 10 (L) 01/08/2020 2356   ACIDBASEDEF 18.0 (H) 01/08/2020 2356   O2SAT 99.0 01/08/2020 2356     Coagulation Profile: Recent Labs  Lab 01/08/20 2345  INR 1.4*    Cardiac Enzymes: Recent Labs  Lab 01/08/20 1622  CKTOTAL 178    HbA1C: Hgb A1c MFr Bld  Date/Time Value Ref Range Status  01/08/2020 11:45 PM 8.5 (H) 4.8 - 5.6 % Final  Comment:    (NOTE) Pre diabetes:          5.7%-6.4% Diabetes:              >6.4% Glycemic control for   <7.0% adults with diabetes   07/29/2019 09:48 AM 6.8 (H) 4.6 - 6.5 % Final    Comment:    Glycemic Control Guidelines for People with Diabetes:Non Diabetic:  <6%Goal of Therapy: <7%Additional Action Suggested:  >8%    CBG: Recent Labs  Lab 01/11/20 1514 01/11/20 1937 01/11/20 2342 01/12/20 0347 01/12/20 0724  GLUCAP 79 100* 102* 82 73    Past Medical History  She,  has a past medical history of Cancer ([place]) (2005), COPD (chronic obstructive pulmonary disease) ([place]), Depression, Diabetes mellitus without complication ([place]), and Hyperlipidemia.   Surgical History    Past Surgical History:  Procedure Laterality Date  . BREAST SURGERY  2005  . CATARACT  EXTRACTION    . CHOLECYSTECTOMY  2013     Social History   reports that she has been smoking cigarettes. She has a 40.00 pack-year smoking history. She has never used smokeless tobacco. She reports that she does not drink alcohol or use drugs.   Family History   Her family history includes Alzheimer's disease in her mother; Arthritis in her mother; COPD in her brother and brother; Dementia in her father.   Allergies No Known Allergies   Medications Prior to Admission: - Ezetimibe 10 mg daily QD - Gabapentin 800 mg at bedtime  - Glipizide 10 mg QD - Januvia 100 mg QD - Losartan 25 mg QD - Rosuvastatin 20 mg QD  - Symbicort - Venlafaxine 65 mg QD - Albuterol    Dr. Jose Persia Internal Medicine PGY-1  Pager: [phone] 01/12/2020, 10:43 AM    PCCM Attending:   74 yo, h/o breast ca, copd, dmii, UTI, e coli, arf on cvvhd, DKA, AGMA  BP (!) 159/74   Pulse 89   Temp 98.4 F (36.9 C) (Oral)   Resp 18   Wt 53.9 kg   SpO2 97%   BMI 21.73 kg/m   Gen: alert, oriented, following commands  HENT: NCAT, sclera clear, tracking  Heart: rrr, s1 s2  Lungs, diminished, no wheeze   Labs reviewed   A:  AHRF on MV, resolved  AGMA, DKA, resolved  COPD  ARF, on CVVHD, may need HD again  Has good UOP  HTN  P: Complete ceftriaxone X7 days  Scheduled nebs  Repeat bedside swallow  SLP to see  If fails could need cortrack   Stable for transfer from the ICU to [facility] for tomorrow  Garner Nash, DO [facility] 01/12/2020 11:09 AM

## 2020-01-12 NOTE — Progress Notes (Signed)
Family was updated on plan of care and current status over the phone. All questions were answered at this time.

## 2020-01-12 NOTE — Evaluation (Signed)
Physical Therapy Evaluation Patient Details Name: Cindy Burns MRN: [id number] DOB: 02/01/1946 Today's Date: 01/12/2020   History of Present Illness  Pt is a 74 year old woman admitted on 01/08/20 to [facility] and subsequently transferred to [facility] with respiratory failure requring intubation, DKA, UTI, hyperkalemia and renal failure requiring CRRT. Extubated 01/11/20. PMH: COPD, breast cancer, Dm, MDD.  Clinical Impression  Pt admitted with/for respiratory failure requiring intubation.  On evaluation, pt was minimally responsive to interaction by therapists and needed total assist for basic mobility.  Pt currently limited functionally due to the problems listed. ( See problems list.)   Pt will benefit from PT to maximize function and safety in order to get ready for next venue listed below.     Follow Up Recommendations SNF;Supervision/Assistance - 24 hour    Equipment Recommendations  None recommended by PT;Other (comment)(TBD)    Recommendations for Other Services       Precautions / Restrictions Precautions Precautions: Fall Restrictions Weight Bearing Restrictions: No      Mobility  Bed Mobility Overal bed mobility: Needs Assistance Bed Mobility: Supine to Sit     Supine to sit: Total assist     General bed mobility comments: assisted for all aspects, no initiation, but became more alert once in sitting  Transfers Overall transfer level: Needs assistance   Transfers: Squat Pivot Transfers     Squat pivot transfers: +2 physical assistance;Total assist     General transfer comment: pt unable to bear weight, squat pivoted to chair, recommended nursing assist her back with [facility]  Ambulation/Gait             General Gait Details: unable  Stairs            Wheelchair Mobility    Modified Rankin (Stroke Patients Only)       Balance Overall balance assessment: Needs assistance   Sitting balance-Leahy Scale: Fair Sitting balance - Comments: able to sit  with trunk away from back of chair once placed     Standing balance-Leahy Scale: Poor                               Pertinent Vitals/Pain Pain Assessment: Faces Faces Pain Scale: No hurt Pain Intervention(s): Monitored during session    Home Living Family/patient expects to be discharged to:: Skilled nursing facility Living Arrangements: Alone                    Prior Function Level of Independence: Independent         Comments: reports driving PTA     Hand Dominance   Dominant Hand: Right    Extremity/Trunk Assessment   Upper Extremity Assessment Upper Extremity Assessment: Generalized weakness    Lower Extremity Assessment Lower Extremity Assessment: Generalized weakness(pt appears inattentive to her L side at times)    Cervical / Trunk Assessment Cervical / Trunk Assessment: (flexed posture, forward head)  Communication   Communication: Expressive difficulties(minimally verbal)  Cognition Arousal/Alertness: Lethargic Behavior During Therapy: Flat affect Overall Cognitive Status: Impaired/Different from baseline Area of Impairment: Following commands;Attention                   Current Attention Level: Focused   Following Commands: Follows one step commands with increased time;Follows one step commands inconsistently              General Comments      Exercises  Assessment/Plan    PT Assessment Patient needs continued PT services  PT Problem List Decreased strength;Decreased activity tolerance;Decreased balance;Decreased mobility;Decreased coordination;Decreased cognition;Decreased safety awareness       PT Treatment Interventions Gait training;Functional mobility training;Therapeutic activities;Balance training;Patient/family education    PT Goals (Current goals can be found in the Care Plan section)  Acute Rehab PT Goals Patient Stated Goal: agreeable to rehab PT Goal Formulation: With patient Time For Goal  Achievement: 01/26/20 Potential to Achieve Goals: Fair    Frequency Min 3X/week   Barriers to discharge        Co-evaluation PT/OT/SLP Co-Evaluation/Treatment: Yes Reason for Co-Treatment: For patient/therapist safety PT goals addressed during session: Mobility/safety with mobility OT goals addressed during session: Strengthening/ROM       AM-PAC PT "6 Clicks" Mobility  Outcome Measure Help needed turning from your back to your side while in a flat bed without using bedrails?: Total Help needed moving from lying on your back to sitting on the side of a flat bed without using bedrails?: Total Help needed moving to and from a bed to a chair (including a wheelchair)?: Total Help needed standing up from a chair using your arms (e.g., wheelchair or bedside chair)?: Total Help needed to walk in hospital room?: Total Help needed climbing 3-5 steps with a railing? : Total 6 Click Score: 6    End of Session   Activity Tolerance: Patient limited by fatigue;Patient limited by lethargy Patient left: in chair;with call bell/phone within reach;with chair alarm set Nurse Communication: Mobility status;Need for lift equipment PT Visit Diagnosis: Muscle weakness (generalized) (M62.81);Other abnormalities of gait and mobility (R26.89)    Time: 7681-1572 PT Time Calculation (min) (ACUTE ONLY): 35 min   Charges:   PT Evaluation $PT Eval Moderate Complexity: 1 Mod          01/12/2020  Ginger Carne., PT Acute Rehabilitation Services [phone]  (pager) [phone]  (office)  Tessie Fass Elanah Osmanovic 01/12/2020, 5:47 PM

## 2020-01-13 DIAGNOSIS — Z452 Encounter for adjustment and management of vascular access device: Secondary | ICD-10-CM

## 2020-01-13 DIAGNOSIS — N179 Acute kidney failure, unspecified: Secondary | ICD-10-CM | POA: Diagnosis not present

## 2020-01-13 LAB — CULTURE, BLOOD (SINGLE): Culture: NO GROWTH

## 2020-01-13 LAB — GLUCOSE, CAPILLARY
Glucose-Capillary: 95 mg/dL (ref 70–99)
Glucose-Capillary: 142 mg/dL — ABNORMAL HIGH (ref 70–99)
Glucose-Capillary: 293 mg/dL — ABNORMAL HIGH (ref 70–99)
Glucose-Capillary: 189 mg/dL — ABNORMAL HIGH (ref 70–99)
Glucose-Capillary: 208 mg/dL — ABNORMAL HIGH (ref 70–99)
Glucose-Capillary: 292 mg/dL — ABNORMAL HIGH (ref 70–99)
Glucose-Capillary: 123 mg/dL — ABNORMAL HIGH (ref 70–99)

## 2020-01-13 LAB — RENAL FUNCTION PANEL
Albumin: 2.1 g/dL — ABNORMAL LOW (ref 3.5–5.0)
Anion gap: 16 — ABNORMAL HIGH (ref 5–15)
BUN: 48 mg/dL — ABNORMAL HIGH (ref 8–23)
CO2: 21 mmol/L — ABNORMAL LOW (ref 22–32)
Calcium: 7.4 mg/dL — ABNORMAL LOW (ref 8.9–10.3)
Chloride: 97 mmol/L — ABNORMAL LOW (ref 98–111)
Creatinine, Ser: 5.06 mg/dL — ABNORMAL HIGH (ref 0.44–1.00)
GFR calc Af Amer: 9 mL/min — ABNORMAL LOW (ref >60)
GFR calc non Af Amer: 8 mL/min — ABNORMAL LOW (ref >60)
Glucose, Bld: 110 mg/dL — ABNORMAL HIGH (ref 70–99)
Phosphorus: 3.5 mg/dL (ref 2.5–4.6)
Potassium: 3.8 mmol/L (ref 3.5–5.1)
Sodium: 134 mmol/L — ABNORMAL LOW (ref 135–145)

## 2020-01-13 LAB — CBC WITH DIFFERENTIAL/PLATELET
Abs Immature Granulocytes: 0.08 10*3/uL — ABNORMAL HIGH (ref 0.00–0.07)
Basophils Absolute: 0 10*3/uL (ref 0.0–0.1)
Basophils Relative: 0 %
Eosinophils Absolute: 0.1 10*3/uL (ref 0.0–0.5)
Eosinophils Relative: 1 %
HCT: 28 % — ABNORMAL LOW (ref 36.0–46.0)
Hemoglobin: 9.1 g/dL — ABNORMAL LOW (ref 12.0–15.0)
Immature Granulocytes: 1 %
Lymphocytes Relative: 14 %
Lymphs Abs: 0.9 10*3/uL (ref 0.7–4.0)
MCH: 28.5 pg (ref 26.0–34.0)
MCHC: 32.5 g/dL (ref 30.0–36.0)
MCV: 87.8 fL (ref 80.0–100.0)
Monocytes Absolute: 0.4 10*3/uL (ref 0.1–1.0)
Monocytes Relative: 7 %
Neutro Abs: 5 10*3/uL (ref 1.7–7.7)
Neutrophils Relative %: 77 %
Platelets: 111 10*3/uL — ABNORMAL LOW (ref 150–400)
RBC: 3.19 MIL/uL — ABNORMAL LOW (ref 3.87–5.11)
RDW: 13.8 % (ref 11.5–15.5)
WBC: 6.6 10*3/uL (ref 4.0–10.5)
nRBC: 0 % (ref 0.0–0.2)

## 2020-01-13 LAB — APTT: aPTT: 42 seconds — ABNORMAL HIGH (ref 24–36)

## 2020-01-13 MED ORDER — INSULIN ASPART 100 UNIT/ML ~~LOC~~ SOLN
0.0000 [IU] | Freq: Every day | SUBCUTANEOUS | Status: DC
  Administered 2020-01-23: 2 [IU] via SUBCUTANEOUS

## 2020-01-13 MED ORDER — SODIUM CHLORIDE 0.9 % IV SOLN: INTRAVENOUS | Status: DC

## 2020-01-13 MED ORDER — INSULIN GLARGINE 100 UNIT/ML ~~LOC~~ SOLN
5.0000 [IU] | Freq: Every day | SUBCUTANEOUS | Status: DC
  Administered 2020-01-13 – 2020-01-25 (×13): 5 [IU] via SUBCUTANEOUS
  Filled 2020-01-13 (×14): qty 0.05

## 2020-01-13 MED ORDER — AMLODIPINE BESYLATE 5 MG PO TABS
5.0000 mg | ORAL_TABLET | Freq: Every day | ORAL | Status: DC
  Administered 2020-01-13 – 2020-02-04 (×22): 5 mg via ORAL
  Filled 2020-01-13 (×24): qty 1

## 2020-01-13 MED ORDER — INSULIN ASPART 100 UNIT/ML ~~LOC~~ SOLN
0.0000 [IU] | Freq: Three times a day (TID) | SUBCUTANEOUS | Status: DC
  Administered 2020-01-13: 5 [IU] via SUBCUTANEOUS
  Administered 2020-01-13: 3 [IU] via SUBCUTANEOUS
  Administered 2020-01-14: 4 [IU] via SUBCUTANEOUS
  Administered 2020-01-14: 2 [IU] via SUBCUTANEOUS
  Administered 2020-01-15 (×2): 1 [IU] via SUBCUTANEOUS
  Administered 2020-01-17 (×2): 2 [IU] via SUBCUTANEOUS
  Administered 2020-01-18: 3 [IU] via SUBCUTANEOUS
  Administered 2020-01-18 – 2020-01-20 (×3): 2 [IU] via SUBCUTANEOUS
  Administered 2020-01-20: 1 [IU] via SUBCUTANEOUS
  Administered 2020-01-21: 2 [IU] via SUBCUTANEOUS
  Administered 2020-01-22: 1 [IU] via SUBCUTANEOUS
  Administered 2020-01-22: 3 [IU] via SUBCUTANEOUS
  Administered 2020-01-23: 1 [IU] via SUBCUTANEOUS
  Administered 2020-01-24 (×2): 2 [IU] via SUBCUTANEOUS

## 2020-01-13 NOTE — Progress Notes (Signed)
PROGRESS NOTE    MIHIRA Burns  PNT:[id number] DOB: 1946-05-09 DOA: 01/08/2020 PCP: [name], MD    Brief Narrative:  74yo with hx of breast cancer, COPD, MDD, and DM presented to [place] with sob, found to be in ARF with high anion gap acidosis. Transferred to [facility] for further work up.  Assessment & Plan:   Active Problems:   Renal failure   AKI (acute kidney injury) ([place])   Pressure injury of skin  Acidosis secondary toDKA and renal failure -glucose trends now much stable and seems controlled -Cr worsening, per below. Nephrology following -Continue on bicarb as tolerated  Acute hypoxemic respiratory failure -Did require intubation initially, since extubated -Currently remains on room air. Resolved  ARF -Cr of 15.43 as of 4/3 -Nephrology following, pt is s/p CRRT in ICU -Cr currently over 5 -HD on hold per nephrology -Will repeat bmet in AM  Normocytic anemia -Remains hemodynamically stable at this time -Hgb currently stable at 9.1 -Will repeat CBC in AM  UTI -Currently on day 5 of rocephin, planning to complete 7 day course -Pan-sensitive ecoli noted on urine culture. Blood cx neg  COPD -currently on minimal O2 support -Continued on duoneb as per RT  HTN -BP stable at this time -Cont on norvasc and PRN labetalol  DM -Glucose overall stable -Blood sugar was noted to be in the mid-200's this afternoon.  -Have ordered lantus 5 units as was recommended by Diabetic Coordinator earlier -Have changed SSI to [facility]   DVT prophylaxis: Heparin subq Code Status: Full Family Communication: Pt in room, family not at bedside Disposition Plan: From home, plan d/c to snf when renal function improves and when cleared by Nephrology  Consultants:   PCCM  Nephrology  Procedures:   CRRT  Antimicrobials: Anti-infectives (From admission, onward)   Start     Dose/Rate Route Frequency Ordered Stop   01/08/20 2315  cefTRIAXone (ROCEPHIN) 1 g in sodium chloride  0.9 % 100 mL IVPB     1 g 200 mL/hr over 30 Minutes Intravenous Every 24 hours 01/08/20 2224 01/15/20 2314       Subjective: Without complaints this AM  Objective: Vitals:   01/13/20 0700 01/13/20 0800 01/13/20 0821 01/13/20 1157  BP: (!) 177/106     Pulse: 80     Resp: (!) 21     Temp:   (!) 97.3 F (36.3 C) (!) 97.4 F (36.3 C)  TempSrc:   Oral Oral  SpO2: 99% 98%    Weight:        Intake/Output Summary (Last 24 hours) at 01/13/2020 1358 Last data filed at 01/13/2020 0800 Gross per 24 hour  Intake 704.41 ml  Output 500 ml  Net 204.41 ml   Filed Weights   01/10/20 0100 01/11/20 0500 01/12/20 0500  Weight: 54.5 kg 53.3 kg 53.9 kg    Examination:  General exam: Appears calm and comfortable  Respiratory system: Clear to auscultation. Respiratory effort normal. Cardiovascular system: S1 & S2 heard, Regular Gastrointestinal system: Abdomen is nondistended, soft and nontender. No organomegaly or masses felt. Normal bowel sounds heard. Central nervous system: Alert and oriented. No focal neurological deficits. Extremities: Symmetric 5 x 5 power. Skin: No rashes, lesions Psychiatry: Judgement and insight appear normal. Mood & affect appropriate.   Data Reviewed: I have personally reviewed following labs and imaging studies  CBC: Recent Labs  Lab 01/10/20 1651 01/11/20 0527 01/11/20 1645 01/12/20 0600 01/13/20 0507  WBC 9.8 7.9 7.5 6.5 6.6  NEUTROABS  8.6* 6.6 6.2 5.0 5.0  HGB 10.6* 10.0* 9.5* 9.3* 9.1*  HCT 31.3* 29.5* 28.9* 28.6* 28.0*  MCV 86.5 85.3 88.7 88.8 87.8  PLT 93* 91* 104* 90* 338*   Basic Metabolic Panel: Recent Labs  Lab 01/08/20 2210 01/08/20 2356 01/10/20 0737 01/10/20 0737 01/10/20 1651 01/10/20 1651 01/11/20 0527 01/11/20 1645 01/12/20 0600 01/12/20 1601 01/13/20 0507  NA 132*   < > 137   < > 134*   < > 136 135 139 134* 134*  K 4.3   < > 4.3   < > 4.1   < > 3.9 4.0 3.9 4.2 3.8  CL 98   < > 102   < > 98   < > 101 101 104 99 97*  CO2  8*   < > 24   < > 24   < > 25 24 19* 21* 21*  GLUCOSE 323*   < > 101*   < > 106*   < > 121* 96 79 80 110*  BUN 196*   < > 26*   < > 21   < > 21 24* 36* 40* 48*  CREATININE 13.36*   < > 2.67*   < > 2.18*   < > 1.72* 2.39* 3.64* 4.07* 5.06*  CALCIUM 5.6*   < > 7.3*   < > 7.2*   < > 7.3* 7.3* 7.4* 7.5* 7.4*  MG 1.2*  --  2.2  --  2.2  --  2.2 2.3  --   --   --   PHOS >30.0*   < > 2.6   < > 2.2*  2.3*   < > 1.9*  1.8* 2.3*  2.4* 2.8 3.0 3.5   < > = values in this interval not displayed.   GFR: Estimated Creatinine Clearance: 8.1 mL/min (A) (by C-G formula based on SCr of 5.06 mg/dL (H)). Liver Function Tests: Recent Labs  Lab 01/08/20 1412 01/08/20 1412 01/08/20 2210 01/09/20 0940 01/11/20 0527 01/11/20 1645 01/12/20 0600 01/12/20 1601 01/13/20 0507  AST 17  --  18  --   --   --   --   --   --   ALT 26  --  21  --   --   --   --   --   --   ALKPHOS 120  --  87  --   --   --   --   --   --   BILITOT 0.9  --  0.8  --   --   --   --   --   --   PROT 8.2*  --  5.3*  --   --   --   --   --   --   ALBUMIN 3.5   < > 2.1*   < > 2.0* 2.1* 2.0* 2.3* 2.1*   < > = values in this interval not displayed.   No results for input(s): LIPASE, AMYLASE in the last 168 hours. No results for input(s): AMMONIA in the last 168 hours. Coagulation Profile: Recent Labs  Lab 01/08/20 2345  INR 1.4*   Cardiac Enzymes: Recent Labs  Lab 01/08/20 1622  CKTOTAL 178   BNP (last 3 results) No results for input(s): PROBNP in the last 8760 hours. HbA1C: No results for input(s): HGBA1C in the last 72 hours. CBG: Recent Labs  Lab 01/12/20 1904 01/12/20 2310 01/13/20 0302 01/13/20 0819 01/13/20 1156  GLUCAP 118* 132* 95 142* 293*  Lipid Profile: No results for input(s): CHOL, HDL, LDLCALC, TRIG, CHOLHDL, LDLDIRECT in the last 72 hours. Thyroid Function Tests: No results for input(s): TSH, T4TOTAL, FREET4, T3FREE, THYROIDAB in the last 72 hours. Anemia Panel: No results for input(s):  VITAMINB12, FOLATE, FERRITIN, TIBC, IRON, RETICCTPCT in the last 72 hours. Sepsis Labs: Recent Labs  Lab 01/08/20 1412 01/08/20 1811 01/08/20 2220 01/09/20 0247  LATICACIDVEN 1.1 3.7* 2.1* 1.2    Recent Results (from the past 240 hour(s))  Respiratory Panel by RT PCR (Flu A&B, Covid) - Nasopharyngeal Swab     Status: None   Collection Time: 01/08/20  2:12 PM   Specimen: Nasopharyngeal Swab  Result Value Ref Range Status   SARS Coronavirus 2 by RT PCR NEGATIVE NEGATIVE Final    Comment: (NOTE) SARS-CoV-2 target nucleic acids are NOT DETECTED. The SARS-CoV-2 RNA is generally detectable in upper respiratoy specimens during the acute phase of infection. The lowest concentration of SARS-CoV-2 viral copies this assay can detect is 131 copies/mL. A negative result does not preclude SARS-Cov-2 infection and should not be used as the sole basis for treatment or other patient management decisions. A negative result may occur with  improper specimen collection/handling, submission of specimen other than nasopharyngeal swab, presence of viral mutation(s) within the areas targeted by this assay, and inadequate number of viral copies (<131 copies/mL). A negative result must be combined with clinical observations, patient history, and epidemiological information. The expected result is Negative. Fact Sheet for Patients:  PinkCheek.be Fact Sheet for Healthcare Providers:  GravelBags.it This test is not yet ap proved or cleared by the [place] FDA and  has been authorized for detection and/or diagnosis of SARS-CoV-2 by FDA under an Emergency Use Authorization (EUA). This EUA will remain  in effect (meaning this test can be used) for the duration of the COVID-19 declaration under Section 564(b)(1) of the Act, 21 U.S.C. section 360bbb-3(b)(1), unless the authorization is terminated or revoked sooner.    Influenza A by PCR NEGATIVE  NEGATIVE Final   Influenza B by PCR NEGATIVE NEGATIVE Final    Comment: (NOTE) The Xpert Xpress SARS-CoV-2/FLU/RSV assay is intended as an aid in  the diagnosis of influenza from Nasopharyngeal swab specimens and  should not be used as a sole basis for treatment. Nasal washings and  aspirates are unacceptable for Xpert Xpress SARS-CoV-2/FLU/RSV  testing. Fact Sheet for Patients: PinkCheek.be Fact Sheet for Healthcare Providers: GravelBags.it This test is not yet approved or cleared by the [place] FDA and  has been authorized for detection and/or diagnosis of SARS-CoV-2 by  FDA under an Emergency Use Authorization (EUA). This EUA will remain  in effect (meaning this test can be used) for the duration of the  Covid-19 declaration under Section 564(b)(1) of the Act, 21  U.S.C. section 360bbb-3(b)(1), unless the authorization is  terminated or revoked. Performed at [facility], [address] [place], [place] [address]   Blood culture (single)     Status: None   Collection Time: 01/08/20  2:12 PM   Specimen: BLOOD  Result Value Ref Range Status   Specimen Description BLOOD RIGHT ANTECUBITAL  Final   Special Requests   Final    BOTTLES DRAWN AEROBIC AND ANAEROBIC Blood Culture results may not be optimal due to an excessive volume of blood received in culture bottles   Culture   Final    NO GROWTH 5 DAYS Performed at [facility], [address], [place], [place] [address]  Report Status 01/13/2020 FINAL  Final  MRSA PCR Screening     Status: None   Collection Time: 01/08/20 10:20 PM   Specimen: Nasopharyngeal  Result Value Ref Range Status   MRSA by PCR NEGATIVE NEGATIVE Final    Comment:        The GeneXpert MRSA Assay (FDA approved for NASAL specimens only), is one component of a comprehensive MRSA colonization surveillance program. It is not intended to diagnose MRSA infection nor to  guide or monitor treatment for MRSA infections. Performed at [facility], [place] [address] [place], [place] [address]   Culture, Urine     Status: Abnormal   Collection Time: 01/08/20 10:41 PM   Specimen: Urine, Catheterized  Result Value Ref Range Status   Specimen Description URINE, CATHETERIZED  Final   Special Requests   Final    NONE Performed at [facility], [place] [address] [place], [place] [address]    Culture 10,000 COLONIES/mL ESCHERICHIA COLI (A)  Final   Report Status 01/11/2020 FINAL  Final   Organism ID, Bacteria ESCHERICHIA COLI (A)  Final      Susceptibility   Escherichia coli - MIC*    AMPICILLIN 4 SENSITIVE Sensitive     CEFAZOLIN <=4 SENSITIVE Sensitive     CEFTRIAXONE <=0.25 SENSITIVE Sensitive     CIPROFLOXACIN <=0.25 SENSITIVE Sensitive     GENTAMICIN <=1 SENSITIVE Sensitive     IMIPENEM <=0.25 SENSITIVE Sensitive     NITROFURANTOIN <=16 SENSITIVE Sensitive     TRIMETH/SULFA <=20 SENSITIVE Sensitive     AMPICILLIN/SULBACTAM <=2 SENSITIVE Sensitive     PIP/TAZO <=4 SENSITIVE Sensitive     * 10,000 COLONIES/mL ESCHERICHIA COLI  Culture, blood (single)     Status: None (Preliminary result)   Collection Time: 01/08/20 11:45 PM   Specimen: BLOOD  Result Value Ref Range Status   Specimen Description BLOOD LEFT ARM  Final   Special Requests   Final    BOTTLES DRAWN AEROBIC AND ANAEROBIC Blood Culture results may not be optimal due to an excessive volume of blood received in culture bottles   Culture   Final    NO GROWTH 4 DAYS Performed at [facility], [place] [address] [place], [place] [address]    Report Status PENDING  Incomplete     Radiology Studies: No results found.  Scheduled Meds: . amLODipine  5 mg Oral Daily  . Chlorhexidine Gluconate Cloth  6 each Topical Q0600  . feeding supplement (ENSURE ENLIVE)  237 mL Oral BID BM  . heparin  5,000 Units Subcutaneous Q8H  . insulin aspart  0-5 Units Subcutaneous QHS  . insulin aspart   0-9 Units Subcutaneous TID WC  . insulin glargine  5 Units Subcutaneous Daily  . ipratropium-albuterol  3 mL Nebulization Q6H  . mouth rinse  15 mL Mouth Rinse BID  . polyethylene glycol  17 g Oral BID  . senna  2 tablet Oral BID  . sodium bicarbonate  650 mg Oral BID   Continuous Infusions: . sodium chloride 10 mL/hr at 01/13/20 0500  . sodium chloride 75 mL/hr at 01/13/20 0908  . cefTRIAXone (ROCEPHIN)  IV Stopped (01/13/20 0003)     LOS: 5 days   Marylu Lund, MD [facility] Pager On Amion  If 7PM-7AM, please contact night-coverage 01/13/2020, 1:58 PM

## 2020-01-13 NOTE — Progress Notes (Addendum)
Physical Therapy Treatment Patient Details Name: Cindy Burns MRN: [id number] DOB: May 27, 1946 Today's Date: 01/13/2020    History of Present Illness Pt is a 74 year old woman admitted on 01/08/20 to [facility] and subsequently transferred to [facility] with respiratory failure requring intubation, DKA, UTI, hyperkalemia and renal failure requiring CRRT. Extubated 01/11/20. PMH: COPD, breast cancer, Dm, MDD.    PT Comments    Pt is significantly more alert and participative.  Still not very verbal, speaking in 1 to 2 words at a time.  Pt now moving her L side more readily.  Emphasis on transition to EOB, sitting balance, sit to stand and progressing gait.    Follow Up Recommendations  SNF;Supervision/Assistance - 24 hour     Equipment Recommendations  None recommended by PT(TBD)    Recommendations for Other Services       Precautions / Restrictions Precautions Precautions: Fall Restrictions Weight Bearing Restrictions: No    Mobility  Bed Mobility Overal bed mobility: Needs Assistance Bed Mobility: Supine to Sit     Supine to sit: Min assist     General bed mobility comments: pt move legs off the bed and started up toward L elbow, but needed extra truncal assist  to come forward.  Transfers Overall transfer level: Needs assistance Equipment used: None Transfers: Sit to/from Stand Sit to Stand: Min guard         General transfer comment: pt came up off the bed without assist,but decidedly biased posteriorly and leaning heavily against the bed frame.  Once up needed steady assist  Ambulation/Gait Ambulation/Gait assistance: Mod assist Gait Distance (Feet): 36 Feet Assistive device: 1 person hand held assist Gait Pattern/deviations: Step-through pattern;Narrow base of support Gait velocity: slow Gait velocity interpretation: <1.31 ft/sec, indicative of household ambulator General Gait Details: unsteady gait with weak hip drop bil, weak knees and narrow BOS with pt consistently  stepping on her socks.   Stairs             Wheelchair Mobility    Modified Rankin (Stroke Patients Only)       Balance Overall balance assessment: Needs assistance   Sitting balance-Leahy Scale: Fair       Standing balance-Leahy Scale: Poor Standing balance comment: pt reliant on external support.                            Cognition Arousal/Alertness: Lethargic;Awake/alert Behavior During Therapy: Flat affect Overall Cognitive Status: Impaired/Different from baseline Area of Impairment: Following commands;Safety/judgement;Awareness;Problem solving                   Current Attention Level: Focused   Following Commands: Follows one step commands with increased time Safety/Judgement: Decreased awareness of deficits;Decreased awareness of safety Awareness: Intellectual;Emergent Problem Solving: Slow processing        Exercises      General Comments General comments (skin integrity, edema, etc.): vss      Pertinent Vitals/Pain Pain Assessment: Faces Faces Pain Scale: No hurt    Home Living                      Prior Function            PT Goals (current goals can now be found in the care plan section) Acute Rehab PT Goals Patient Stated Goal: I want to go home PT Goal Formulation: With patient Time For Goal Achievement: 01/26/20 Potential to Achieve Goals: Fair Progress  towards PT goals: Progressing toward goals    Frequency    Min 3X/week      PT Plan Current plan remains appropriate    Co-evaluation              AM-PAC PT "6 Clicks" Mobility   Outcome Measure  Help needed turning from your back to your side while in a flat bed without using bedrails?: A Little Help needed moving from lying on your back to sitting on the side of a flat bed without using bedrails?: A Little Help needed moving to and from a bed to a chair (including a wheelchair)?: A Lot Help needed standing up from a chair using  your arms (e.g., wheelchair or bedside chair)?: A Lot Help needed to walk in hospital room?: A Lot Help needed climbing 3-5 steps with a railing? : Total 6 Click Score: 13    End of Session   Activity Tolerance: Patient tolerated treatment well;Patient limited by fatigue Patient left: in chair;with call bell/phone within reach;with chair alarm set Nurse Communication: Mobility status PT Visit Diagnosis: Other abnormalities of gait and mobility (R26.89);Unsteadiness on feet (R26.81);Muscle weakness (generalized) (M62.81)     Time: 3601-6580 PT Time Calculation (min) (ACUTE ONLY): 20 min  Charges:  $Gait Training: 8-22 mins                     01/13/2020  Ginger Carne., PT Acute Rehabilitation Services [phone]  (pager) [phone]  (office)   Tessie Fass Breanna Mcdaniel 01/13/2020, 5:06 PM

## 2020-01-13 NOTE — Progress Notes (Addendum)
[facility] NEPHROLOGY PROGRESS NOTE  Assessment/ Plan: Pt is a 74 y.o. yo female with COPD, DM, breast cancer, admitted with respiratory failure on mechanical ventilation found to have DKA, UTI and severe AKI requiring CRRT.  #Severe acute kidney injury likely due to severe dehydration, UTI and DKA: UA with possible UTI.   Kidney US with no hydronephrosis relatively small right kidney.  Severely acidemic on presentation therefore CRRT was started till 4/6. Urine output is marginal however creatinine level trending.  She looks dry on exam therefore I will start gentle IV hydration.  Strict ins and out.  No plan for HD today.  We will keep HD catheter and daily evaluation for dialysis need.    #Nephrotic range proteinuria likely diabetic nephropathy: Patient has A1c 8.5.  Needs outpatient follow-up.  #Hyperkalemia: Improved with dialysis.  #Hypertension: Blood pressure elevated.  Start amlodipine.  #Metabolic acidosis: Started oral sodium bicarbonate  #Acute hypoxic respiratory failure: Extubated and on oxygen.  Discussed with ICU team.  Subjective: She is alert awake and some delirium.  Denies nausea vomiting chest pain shortness of breath.  Urine output 500 cc.  No other new event. Objective Vital signs in last 24 hours: Vitals:   01/13/20 0500 01/13/20 0700 01/13/20 0800 01/13/20 0821  BP: 105/89 (!) 177/106    Pulse: 79 80    Resp: (!) 24 (!) 21    Temp:    (!) 97.3 F (36.3 C)  TempSrc:    Oral  SpO2: 98% 99% 98%   Weight:       Weight change:   Intake/Output Summary (Last 24 hours) at 01/13/2020 0825 Last data filed at 01/13/2020 0800 Gross per 24 hour  Intake 824.41 ml  Output 500 ml  Net 324.41 ml       Labs: Basic Metabolic Panel: Recent Labs  Lab 01/12/20 0600 01/12/20 1601 01/13/20 0507  NA 139 134* 134*  K 3.9 4.2 3.8  CL 104 99 97*  CO2 19* 21* 21*  GLUCOSE 79 80 110*  BUN 36* 40* 48*  CREATININE 3.64* 4.07* 5.06*  CALCIUM 7.4* 7.5*  7.4*  PHOS 2.8 3.0 3.5   Liver Function Tests: Recent Labs  Lab 01/08/20 1412 01/08/20 1412 01/08/20 2210 01/09/20 0940 01/12/20 0600 01/12/20 1601 01/13/20 0507  AST 17  --  18  --   --   --   --   ALT 26  --  21  --   --   --   --   ALKPHOS 120  --  87  --   --   --   --   BILITOT 0.9  --  0.8  --   --   --   --   PROT 8.2*  --  5.3*  --   --   --   --   ALBUMIN 3.5   < > 2.1*   < > 2.0* 2.3* 2.1*   < > = values in this interval not displayed.   No results for input(s): LIPASE, AMYLASE in the last 168 hours. No results for input(s): AMMONIA in the last 168 hours. CBC: Recent Labs  Lab 01/10/20 1651 01/10/20 1651 01/11/20 0527 01/11/20 0527 01/11/20 1645 01/12/20 0600 01/13/20 0507  WBC 9.8   < > 7.9   < > 7.5 6.5 6.6  NEUTROABS 8.6*   < > 6.6   < > 6.2 5.0 5.0  HGB 10.6*   < > 10.0*   < > 9.5* 9.3* 9.1*  HCT 31.3*   < > 29.5*   < > 28.9* 28.6* 28.0*  MCV 86.5  --  85.3  --  88.7 88.8 87.8  PLT 93*   < > 91*   < > 104* 90* 111*   < > = values in this interval not displayed.   Cardiac Enzymes: Recent Labs  Lab 01/08/20 1622  CKTOTAL 178   CBG: Recent Labs  Lab 01/12/20 1623 01/12/20 1904 01/12/20 2310 01/13/20 0302 01/13/20 0819  GLUCAP 80 118* 132* 95 142*    Iron Studies:  No results for input(s): IRON, TIBC, TRANSFERRIN, FERRITIN in the last 72 hours. Studies/Results: No results found.  Medications: Infusions: . sodium chloride 10 mL/hr at 01/13/20 0500  . cefTRIAXone (ROCEPHIN)  IV Stopped (01/13/20 0003)    Scheduled Medications: . Chlorhexidine Gluconate Cloth  6 each Topical Q0600  . feeding supplement (ENSURE ENLIVE)  237 mL Oral BID BM  . heparin  5,000 Units Subcutaneous Q8H  . insulin aspart  1-3 Units Subcutaneous Q4H  . ipratropium-albuterol  3 mL Nebulization Q6H  . mouth rinse  15 mL Mouth Rinse BID  . polyethylene glycol  17 g Oral BID  . senna  2 tablet Oral BID  . sodium bicarbonate  650 mg Oral BID    have reviewed  scheduled and prn medications.  Physical Exam: General: Alert, awake, little confused.  Lying in bed comfortable. Heart:RRR, s1s2 nl, no rubs Lungs: Clear bilateral, no wheezing Abdomen:soft, Non-tender, non-distended Extremities:No edema Dialysis Access: Left IJ temporary HD catheter  Cindy Burns Cindy Burns 01/13/2020,8:25 AM  LOS: 5 days  Pager: [phone]

## 2020-01-14 DIAGNOSIS — Z452 Encounter for adjustment and management of vascular access device: Secondary | ICD-10-CM | POA: Diagnosis not present

## 2020-01-14 DIAGNOSIS — N179 Acute kidney failure, unspecified: Secondary | ICD-10-CM | POA: Diagnosis not present

## 2020-01-14 LAB — RENAL FUNCTION PANEL
Albumin: 2.2 g/dL — ABNORMAL LOW (ref 3.5–5.0)
Anion gap: 16 — ABNORMAL HIGH (ref 5–15)
BUN: 60 mg/dL — ABNORMAL HIGH (ref 8–23)
CO2: 20 mmol/L — ABNORMAL LOW (ref 22–32)
Calcium: 7.5 mg/dL — ABNORMAL LOW (ref 8.9–10.3)
Chloride: 99 mmol/L (ref 98–111)
Creatinine, Ser: 5.35 mg/dL — ABNORMAL HIGH (ref 0.44–1.00)
GFR calc Af Amer: 9 mL/min — ABNORMAL LOW (ref >60)
GFR calc non Af Amer: 7 mL/min — ABNORMAL LOW (ref >60)
Glucose, Bld: 106 mg/dL — ABNORMAL HIGH (ref 70–99)
Phosphorus: 2.8 mg/dL (ref 2.5–4.6)
Potassium: 3.6 mmol/L (ref 3.5–5.1)
Sodium: 135 mmol/L (ref 135–145)

## 2020-01-14 LAB — CULTURE, BLOOD (SINGLE): Culture: NO GROWTH

## 2020-01-14 LAB — CBC WITH DIFFERENTIAL/PLATELET
Abs Immature Granulocytes: 0.11 10*3/uL — ABNORMAL HIGH (ref 0.00–0.07)
Basophils Absolute: 0 10*3/uL (ref 0.0–0.1)
Basophils Relative: 0 %
Eosinophils Absolute: 0.1 10*3/uL (ref 0.0–0.5)
Eosinophils Relative: 1 %
HCT: 27.1 % — ABNORMAL LOW (ref 36.0–46.0)
Hemoglobin: 8.9 g/dL — ABNORMAL LOW (ref 12.0–15.0)
Immature Granulocytes: 1 %
Lymphocytes Relative: 9 %
Lymphs Abs: 0.9 10*3/uL (ref 0.7–4.0)
MCH: 28.5 pg (ref 26.0–34.0)
MCHC: 32.8 g/dL (ref 30.0–36.0)
MCV: 86.9 fL (ref 80.0–100.0)
Monocytes Absolute: 0.5 10*3/uL (ref 0.1–1.0)
Monocytes Relative: 5 %
Neutro Abs: 8.3 10*3/uL — ABNORMAL HIGH (ref 1.7–7.7)
Neutrophils Relative %: 84 %
Platelets: 131 10*3/uL — ABNORMAL LOW (ref 150–400)
RBC: 3.12 MIL/uL — ABNORMAL LOW (ref 3.87–5.11)
RDW: 13.7 % (ref 11.5–15.5)
WBC: 10 10*3/uL (ref 4.0–10.5)
nRBC: 0 % (ref 0.0–0.2)

## 2020-01-14 LAB — GLUCOSE, CAPILLARY
Glucose-Capillary: 101 mg/dL — ABNORMAL HIGH (ref 70–99)
Glucose-Capillary: 273 mg/dL — ABNORMAL HIGH (ref 70–99)
Glucose-Capillary: 237 mg/dL — ABNORMAL HIGH (ref 70–99)
Glucose-Capillary: 170 mg/dL — ABNORMAL HIGH (ref 70–99)
Glucose-Capillary: 102 mg/dL — ABNORMAL HIGH (ref 70–99)

## 2020-01-14 MED ORDER — IPRATROPIUM-ALBUTEROL 0.5-2.5 (3) MG/3ML IN SOLN
3.0000 mL | Freq: Two times a day (BID) | RESPIRATORY_TRACT | Status: DC
  Administered 2020-01-14 (×2): 3 mL via RESPIRATORY_TRACT
  Filled 2020-01-14 (×2): qty 3

## 2020-01-14 MED ORDER — IPRATROPIUM-ALBUTEROL 0.5-2.5 (3) MG/3ML IN SOLN: 3.0000 mL | Freq: Four times a day (QID) | RESPIRATORY_TRACT | Status: DC | PRN

## 2020-01-14 MED ORDER — SODIUM CHLORIDE 0.9 % IV SOLN: INTRAVENOUS | Status: DC

## 2020-01-14 NOTE — Progress Notes (Signed)
  Speech Language Pathology Treatment: Dysphagia  Patient Details Name: Cindy Burns MRN: [id number] DOB: 1946/08/03 Today's Date: 01/14/2020 Time: 0122-2411 SLP Time Calculation (min) (ACUTE ONLY): 10 min  Assessment / Plan / Recommendation Clinical Impression  Pt seen with higher texture solids for potential upgrade. She is mildly dyspneic at rest and during po's. Masticated regular texture (dentures in place) without increased work of breathing however discussed recommendation for continuation of Dys 3 (mechanical soft texture) due to respiratory status and pt was in agreement. No s/s aspiration with thins. Educated/reviewed compensatory strategies. No further ST needed at this time. Pt's texture can be upgraded by medical staff.    HPI HPI: Cindy Burns is a 74 y.o female with a hx of breast cancer, COPD, MDD, and DM who presented to [facility] with SOB. She was found to have acute renal failure with a significant high anion gap metabolic acidosis and hyperkalemia. She was subsequently transferred to [facility] for HD and further evaluation/management.      SLP Plan  All goals met;Discharge SLP treatment due to (comment)       Recommendations  Diet recommendations: Dysphagia 3 (mechanical soft);Thin liquid Liquids provided via: Straw Medication Administration: Whole meds with puree Supervision: Patient able to self feed;Intermittent supervision to cue for compensatory strategies Compensations: Slow rate;Small sips/bites Postural Changes and/or Swallow Maneuvers: Seated upright 90 degrees                Oral Care Recommendations: Oral care BID Follow up Recommendations: None SLP Visit Diagnosis: Dysphagia, unspecified (R13.10) Plan: All goals met;Discharge SLP treatment due to (comment)       GO                Houston Siren 01/14/2020, 2:23 PM  Orbie Pyo Colvin Caroli.Ed Risk analyst [phone] Office  [phone]

## 2020-01-14 NOTE — Progress Notes (Signed)
[facility] NEPHROLOG[facility] PROGRESS NOTE  Assessment/ Plan: Pt is a 74 y.o. yo female with COPD, DM, breast cancer, admitted with respiratory failure on mechanical ventilation found to have DKA, UTI and severe AKI requiring CRRT.  #Severe acute kidney injury likely due to severe dehydration, UTI and DKA: UA with possible UTI.   Kidney US with no hydronephrosis relatively small right kidney.  Severely acidemic on presentation therefore CRRT was started till 4/6. Urine output is increasing.  Hopefully the serum creatinine level will plateau in next 1 to 2 days.  She still looks dry on exam therefore plan to continue IV fluid for at least another day.  No plan for HD today.  Given the rise in both BUN and creatinine level today I will continue to keep HD catheter.  I have discussed this with the patient. Strict ins and out.      #Nephrotic range proteinuria likely diabetic nephropathy: Patient has A1c 8.5.  Needs outpatient follow-up.  #Hyperkalemia: Improved with dialysis.  #Hypertension: Blood pressure elevated.  Started amlodipine.  #Metabolic acidosis: Started oral sodium bicarbonate  #Acute hypoxic respiratory failure: Extubated and in room air.  Subjective: Seen and examined.  Patient is sitting on bed comfortable.  She was alert awake and following commands.  Denies nausea vomiting chest pain shortness of breath. Objective Vital signs in last 24 hours: Vitals:   01/14/20 0400 01/14/20 0500 01/14/20 0600 01/14/20 0707  BP: (!) 153/136 136/86 (!) 135/124   Pulse:   83   Resp: 20 15 (!) 24   Temp:    (!) 97.5 F (36.4 C)  TempSrc:    Axillary  SpO2:   99%   Weight:  53.4 kg     Weight change:   Intake/Output Summary (Last 24 hours) at 01/14/2020 0824 Last data filed at 01/14/2020 0600 Gross per 24 hour  Intake 1706.06 ml  Output 725 ml  Net 981.06 ml       Labs: Basic Metabolic Panel: Recent Labs  Lab 01/12/20 1601 01/13/20 0507 01/14/20 0257  NA 134*  134* 135  K 4.2 3.8 3.6  CL 99 97* 99  CO2 21* 21* 20*  GLUCOSE 80 110* 106*  BUN 40* 48* 60*  CREATININE 4.07* 5.06* 5.35*  CALCIUM 7.5* 7.4* 7.5*  PHOS 3.0 3.5 2.8   Liver Function Tests: Recent Labs  Lab 01/08/20 1412 01/08/20 1412 01/08/20 2210 01/09/20 0940 01/12/20 1601 01/13/20 0507 01/14/20 0257  AST 17  --  18  --   --   --   --   ALT 26  --  21  --   --   --   --   ALKPHOS 120  --  87  --   --   --   --   BILITOT 0.9  --  0.8  --   --   --   --   PROT 8.2*  --  5.3*  --   --   --   --   ALBUMIN 3.5   < > 2.1*   < > 2.3* 2.1* 2.2*   < > = values in this interval not displayed.   No results for input(s): LIPASE, AMYLASE in the last 168 hours. No results for input(s): AMMONIA in the last 168 hours. CBC: Recent Labs  Lab 01/11/20 0527 01/11/20 0527 01/11/20 1645 01/11/20 1645 01/12/20 0600 01/13/20 0507 01/14/20 0257  WBC 7.9   < > 7.5   < > 6.5 6.6 10.0  NEUTROABS  6.6   < > 6.2   < > 5.0 5.0 8.3*  HGB 10.0*   < > 9.5*   < > 9.3* 9.1* 8.9*  HCT 29.5*   < > 28.9*   < > 28.6* 28.0* 27.1*  MCV 85.3  --  88.7  --  88.8 87.8 86.9  PLT 91*   < > 104*   < > 90* 111* 131*   < > = values in this interval not displayed.   Cardiac Enzymes: Recent Labs  Lab 01/08/20 1622  CKTOTAL 178   CBG: Recent Labs  Lab 01/13/20 1740 01/13/20 1807 01/13/20 1902 01/13/20 2133 01/14/20 0708  GLUCAP 208* 189* 292* 123* 101*    Iron Studies:  No results for input(s): IRON, TIBC, TRANSFERRIN, FERRITIN in the last 72 hours. Studies/Results: No results found.  Medications: Infusions: . sodium chloride Stopped (01/13/20 0735)  . sodium chloride 75 mL/hr at 01/14/20 0600  . cefTRIAXone (ROCEPHIN)  IV Stopped (01/14/20 0030)    Scheduled Medications: . amLODipine  5 mg Oral Daily  . Chlorhexidine Gluconate Cloth  6 each Topical Q0600  . feeding supplement (ENSURE ENLIVE)  237 mL Oral BID BM  . heparin  5,000 Units Subcutaneous Q8H  . insulin aspart  0-5 Units  Subcutaneous QHS  . insulin aspart  0-9 Units Subcutaneous TID WC  . insulin glargine  5 Units Subcutaneous Daily  . ipratropium-albuterol  3 mL Nebulization BID  . mouth rinse  15 mL Mouth Rinse BID  . polyethylene glycol  17 g Oral BID  . senna  2 tablet Oral BID  . sodium bicarbonate  650 mg Oral BID    have reviewed scheduled and prn medications.  Physical Exam: General: Alert, awake, dry mucous membrane, sitting on bed Heart:RRR, s1s2 nl, no rubs Lungs: Clear bilateral, no wheezing Abdomen:soft, Non-tender, non-distended Extremities:No edema Dialysis Access: Left IJ temporary HD catheter  Cindy Burns Tanna Furry 01/14/2020,8:24 AM  LOS: 6 days  Pager: [phone]

## 2020-01-14 NOTE — Progress Notes (Signed)
Physical Therapy Treatment Patient Details Name: Cindy Burns MRN: [id number] DOB: 1946-09-29 Today's Date: 01/14/2020    History of Present Illness Pt is a 74 year old woman admitted on 01/08/20 to [facility] and subsequently transferred to [facility] with respiratory failure requring intubation, DKA, UTI, hyperkalemia and renal failure requiring CRRT. Extubated 01/11/20. PMH: COPD, breast cancer, Dm, MDD.    PT Comments    Pt's participation and mentation are slowly improving.  Pt's treatment disrupted by incontinence of stool and a large portion of the session included cleaning pt up in bed after ambulating down the hall with the RW.  Follow Up Recommendations  SNF;Supervision/Assistance - 24 hour     Equipment Recommendations  None recommended by PT    Recommendations for Other Services       Precautions / Restrictions Precautions Precautions: Fall    Mobility  Bed Mobility Overal bed mobility: Needs Assistance Bed Mobility: Supine to Sit     Supine to sit: Min assist;Min guard     General bed mobility comments: pt move legs off the bed and struggled up toward L elbow, needing guarding while pushing up.  Transfers Overall transfer level: Needs assistance Equipment used: Rolling walker (2 wheeled) Transfers: Sit to/from Stand Sit to Stand: Min assist         General transfer comment: min stability assist  Ambulation/Gait Ambulation/Gait assistance: Min assist;Mod assist Gait Distance (Feet): 120 Feet Assistive device: Rolling walker (2 wheeled) Gait Pattern/deviations: Step-through pattern;Scissoring;Narrow base of support Gait velocity: slower Gait velocity interpretation: <1.8 ft/sec, indicate of risk for recurrent falls General Gait Details: pt was unsteady with right LE adducting over the L LE.  She was able to stay more upright today.  Pt lost focus during the return trip, having been incontinent of stool in the hall.  Pt became more fatigued with progression of  distance.   Stairs             Wheelchair Mobility    Modified Rankin (Stroke Patients Only)       Balance Overall balance assessment: Needs assistance   Sitting balance-Leahy Scale: Fair Sitting balance - Comments: pt able to scoot weakly to EOB and sit without UE assist     Standing balance-Leahy Scale: Poor Standing balance comment: pt reliant on external support.                            Cognition Arousal/Alertness: Awake/alert Behavior During Therapy: WFL for tasks assessed/performed Overall Cognitive Status: Impaired/Different from baseline Area of Impairment: Orientation                 Orientation Level: Situation Current Attention Level: Sustained   Following Commands: Follows one step commands with increased time Safety/Judgement: Decreased awareness of safety;Decreased awareness of deficits Awareness: Emergent Problem Solving: Slow processing        Exercises      General Comments General comments (skin integrity, edema, etc.): vss      Pertinent Vitals/Pain Pain Assessment: Faces Faces Pain Scale: No hurt Pain Intervention(s): Monitored during session    Home Living Family/patient expects to be discharged to:: Skilled nursing facility Living Arrangements: Alone                  Prior Function Level of Independence: Independent      Comments: reports driving PTA   PT Goals (current goals can now be found in the care plan section) Acute Rehab PT Goals  PT Goal Formulation: With patient Time For Goal Achievement: 01/26/20 Potential to Achieve Goals: Fair Progress towards PT goals: Progressing toward goals    Frequency    Min 3X/week      PT Plan Current plan remains appropriate    Co-evaluation              AM-PAC PT "6 Clicks" Mobility   Outcome Measure  Help needed turning from your back to your side while in a flat bed without using bedrails?: A Little Help needed moving from lying on  your back to sitting on the side of a flat bed without using bedrails?: A Little Help needed moving to and from a bed to a chair (including a wheelchair)?: A Little Help needed standing up from a chair using your arms (e.g., wheelchair or bedside chair)?: A Little Help needed to walk in hospital room?: A Lot Help needed climbing 3-5 steps with a railing? : Total 6 Click Score: 15    End of Session   Activity Tolerance: Patient tolerated treatment well;Patient limited by fatigue Patient left: in chair;with call bell/phone within reach;with chair alarm set Nurse Communication: Mobility status PT Visit Diagnosis: Other abnormalities of gait and mobility (R26.89);Unsteadiness on feet (R26.81);Muscle weakness (generalized) (M62.81)     Time: 4431-5400 PT Time Calculation (min) (ACUTE ONLY): 32 min  Charges:  $Gait Training: 8-22 mins $Therapeutic Activity: 8-22 mins                     01/14/2020  Ginger Carne., PT Acute Rehabilitation Services [phone]  (pager) [phone]  (office)   Tessie Fass Frenchie Dangerfield 01/14/2020, 4:44 PM

## 2020-01-14 NOTE — Progress Notes (Signed)
PROGRESS NOTE    Cindy Burns  MWU:[id number] DOB: 1946-03-24 DOA: 01/08/2020 PCP: [name], MD    Brief Narrative:  74yo with hx of breast cancer, COPD, MDD, and DM presented to [facility] with sob, found to be in ARF with high anion gap acidosis. Transferred to [facility] for further work up.  Assessment & Plan:   Active Problems:   Renal failure   AKI (acute kidney injury) ([place])   Pressure injury of skin  Acidosis secondary toDKA and renal failure -glucose trends now much stable and seems controlled -Cr slightly worsened this AM, per below. Nephrology following -Continue on sodium bicarb as tolerated  Acute hypoxemic respiratory failure -Did require intubation initially, since extubated -Pt remains on room air. Resolved  ARF -Cr of 15.43 as of 4/3 -Nephrology following, pt is s/p CRRT in ICU -Cr currently over 5, slightly higher than yesterday -HD on hold per nephrology -Concerns for dehydration with IVF continued per Nephrology -Repeat bmet in AM  Normocytic anemia -Remains hemodynamically stable at this time -Hgb currently stable at 9.1 -Will repeat CBC in AM  UTI -Currently on day 6 of rocephin, planning to complete 7 day course -Pan-sensitive ecoli noted on urine culture. Blood cx reviewed and is neg  COPD -currently on minimal O2 support -Continued on duoneb as per RT  HTN -BP stable at this time -Cont on norvasc and PRN labetalol as needed  DM -Glucose overall stable -Now on lantus 5 units as was recommended by Diabetic Coordinator earlier -Cont SSI coverage -Glucose trends improved and now stable  DVT prophylaxis: Heparin subq Code Status: Full Family Communication: Pt in room, family not at bedside Disposition Plan: From home, plan d/c to snf when renal function improves and when cleared by Nephrology  Consultants:   PCCM  Nephrology  Procedures:   CRRT  Antimicrobials: Anti-infectives (From admission, onward)   Start     Dose/Rate  Route Frequency Ordered Stop   01/08/20 2315  cefTRIAXone (ROCEPHIN) 1 g in sodium chloride 0.9 % 100 mL IVPB     1 g 200 mL/hr over 30 Minutes Intravenous Every 24 hours 01/08/20 2224 01/15/20 2314      Subjective: Complaining of wanting more water to drink  Objective: Vitals:   01/14/20 1000 01/14/20 1100 01/14/20 1200 01/14/20 1300  BP:   95/68 94/66  Pulse: 88 88 89 91  Resp: (!) 25 (!) 22 (!) 28 (!) 23  Temp:  (!) 97.3 F (36.3 C)    TempSrc:  Oral    SpO2: 98% 99% 100% 100%  Weight:        Intake/Output Summary (Last 24 hours) at 01/14/2020 1354 Last data filed at 01/14/2020 1329 Gross per 24 hour  Intake 2407.24 ml  Output 975 ml  Net 1432.24 ml   Filed Weights   01/11/20 0500 01/12/20 0500 01/14/20 0500  Weight: 53.3 kg 53.9 kg 53.4 kg    Examination: General exam: Awake, laying in bed, in nad, mucus membranes appear dry Respiratory system: Normal respiratory effort, no wheezing Cardiovascular system: regular rate, s1, s2 Gastrointestinal system: Soft, nondistended, positive BS Central nervous system: CN2-12 grossly intact, strength intact Extremities: Perfused, no clubbing Skin: Normal skin turgor, no notable skin lesions seen Psychiatry: Mood normal // no visual hallucinations   Data Reviewed: I have personally reviewed following labs and imaging studies  CBC: Recent Labs  Lab 01/11/20 0527 01/11/20 1645 01/12/20 0600 01/13/20 0507 01/14/20 0257  WBC 7.9 7.5 6.5 6.6 10.0  NEUTROABS  6.6 6.2 5.0 5.0 8.3*  HGB 10.0* 9.5* 9.3* 9.1* 8.9*  HCT 29.5* 28.9* 28.6* 28.0* 27.1*  MCV 85.3 88.7 88.8 87.8 86.9  PLT 91* 104* 90* 111* 845*   Basic Metabolic Panel: Recent Labs  Lab 01/08/20 2210 01/08/20 2356 01/10/20 0737 01/10/20 0737 01/10/20 1651 01/10/20 1651 01/11/20 0527 01/11/20 0527 01/11/20 1645 01/12/20 0600 01/12/20 1601 01/13/20 0507 01/14/20 0257  NA 132*   < > 137   < > 134*   < > 136   < > 135 139 134* 134* 135  K 4.3   < > 4.3   < >  4.1   < > 3.9   < > 4.0 3.9 4.2 3.8 3.6  CL 98   < > 102   < > 98   < > 101   < > 101 104 99 97* 99  CO2 8*   < > 24   < > 24   < > 25   < > 24 19* 21* 21* 20*  GLUCOSE 323*   < > 101*   < > 106*   < > 121*   < > 96 79 80 110* 106*  BUN 196*   < > 26*   < > 21   < > 21   < > 24* 36* 40* 48* 60*  CREATININE 13.36*   < > 2.67*   < > 2.18*   < > 1.72*   < > 2.39* 3.64* 4.07* 5.06* 5.35*  CALCIUM 5.6*   < > 7.3*   < > 7.2*   < > 7.3*   < > 7.3* 7.4* 7.5* 7.4* 7.5*  MG 1.2*  --  2.2  --  2.2  --  2.2  --  2.3  --   --   --   --   PHOS >30.0*   < > 2.6   < > 2.2*  2.3*   < > 1.9*  1.8*   < > 2.3*  2.4* 2.8 3.0 3.5 2.8   < > = values in this interval not displayed.   GFR: Estimated Creatinine Clearance: 7.6 mL/min (A) (by C-G formula based on SCr of 5.35 mg/dL (H)). Liver Function Tests: Recent Labs  Lab 01/08/20 1412 01/08/20 1412 01/08/20 2210 01/09/20 0940 01/11/20 1645 01/12/20 0600 01/12/20 1601 01/13/20 0507 01/14/20 0257  AST 17  --  18  --   --   --   --   --   --   ALT 26  --  21  --   --   --   --   --   --   ALKPHOS 120  --  87  --   --   --   --   --   --   BILITOT 0.9  --  0.8  --   --   --   --   --   --   PROT 8.2*  --  5.3*  --   --   --   --   --   --   ALBUMIN 3.5   < > 2.1*   < > 2.1* 2.0* 2.3* 2.1* 2.2*   < > = values in this interval not displayed.   No results for input(s): LIPASE, AMYLASE in the last 168 hours. No results for input(s): AMMONIA in the last 168 hours. Coagulation Profile: Recent Labs  Lab 01/08/20 2345  INR 1.4*   Cardiac Enzymes: Recent Labs  Lab 01/08/20  1622  CKTOTAL 178   BNP (last 3 results) No results for input(s): PROBNP in the last 8760 hours. HbA1C: No results for input(s): HGBA1C in the last 72 hours. CBG: Recent Labs  Lab 01/13/20 1807 01/13/20 1902 01/13/20 2133 01/14/20 0708 01/14/20 1125  GLUCAP 189* 292* 123* 101* 273*   Lipid Profile: No results for input(s): CHOL, HDL, LDLCALC, TRIG, CHOLHDL, LDLDIRECT in  the last 72 hours. Thyroid Function Tests: No results for input(s): TSH, T4TOTAL, FREET4, T3FREE, THYROIDAB in the last 72 hours. Anemia Panel: No results for input(s): VITAMINB12, FOLATE, FERRITIN, TIBC, IRON, RETICCTPCT in the last 72 hours. Sepsis Labs: Recent Labs  Lab 01/08/20 1412 01/08/20 1811 01/08/20 2220 01/09/20 0247  LATICACIDVEN 1.1 3.7* 2.1* 1.2    Recent Results (from the past 240 hour(s))  Respiratory Panel by RT PCR (Flu A&B, Covid) - Nasopharyngeal Swab     Status: None   Collection Time: 01/08/20  2:12 PM   Specimen: Nasopharyngeal Swab  Result Value Ref Range Status   SARS Coronavirus 2 by RT PCR NEGATIVE NEGATIVE Final    Comment: (NOTE) SARS-CoV-2 target nucleic acids are NOT DETECTED. The SARS-CoV-2 RNA is generally detectable in upper respiratoy specimens during the acute phase of infection. The lowest concentration of SARS-CoV-2 viral copies this assay can detect is 131 copies/mL. A negative result does not preclude SARS-Cov-2 infection and should not be used as the sole basis for treatment or other patient management decisions. A negative result may occur with  improper specimen collection/handling, submission of specimen other than nasopharyngeal swab, presence of viral mutation(s) within the areas targeted by this assay, and inadequate number of viral copies (<131 copies/mL). A negative result must be combined with clinical observations, patient history, and epidemiological information. The expected result is Negative. Fact Sheet for Patients:  PinkCheek.be Fact Sheet for Healthcare Providers:  GravelBags.it This test is not yet ap proved or cleared by the [place] FDA and  has been authorized for detection and/or diagnosis of SARS-CoV-2 by FDA under an Emergency Use Authorization (EUA). This EUA will remain  in effect (meaning this test can be used) for the duration of the COVID-19  declaration under Section 564(b)(1) of the Act, 21 U.S.C. section 360bbb-3(b)(1), unless the authorization is terminated or revoked sooner.    Influenza A by PCR NEGATIVE NEGATIVE Final   Influenza B by PCR NEGATIVE NEGATIVE Final    Comment: (NOTE) The Xpert Xpress SARS-CoV-2/FLU/RSV assay is intended as an aid in  the diagnosis of influenza from Nasopharyngeal swab specimens and  should not be used as a sole basis for treatment. Nasal washings and  aspirates are unacceptable for Xpert Xpress SARS-CoV-2/FLU/RSV  testing. Fact Sheet for Patients: PinkCheek.be Fact Sheet for Healthcare Providers: GravelBags.it This test is not yet approved or cleared by the [place] FDA and  has been authorized for detection and/or diagnosis of SARS-CoV-2 by  FDA under an Emergency Use Authorization (EUA). This EUA will remain  in effect (meaning this test can be used) for the duration of the  Covid-19 declaration under Section 564(b)(1) of the Act, 21  U.S.C. section 360bbb-3(b)(1), unless the authorization is  terminated or revoked. Performed at [facility], [address] [place], [place] [address]   Blood culture (single)     Status: None   Collection Time: 01/08/20  2:12 PM   Specimen: BLOOD  Result Value Ref Range Status   Specimen Description BLOOD RIGHT ANTECUBITAL  Final   Special Requests  Final    BOTTLES DRAWN AEROBIC AND ANAEROBIC Blood Culture results may not be optimal due to an excessive volume of blood received in culture bottles   Culture   Final    NO GROWTH 5 DAYS Performed at [facility], [address] [place], [place] [address]    Report Status 01/13/2020 FINAL  Final  MRSA PCR Screening     Status: None   Collection Time: 01/08/20 10:20 PM   Specimen: Nasopharyngeal  Result Value Ref Range Status   MRSA by PCR NEGATIVE NEGATIVE Final    Comment:        The GeneXpert MRSA Assay (FDA  approved for NASAL specimens only), is one component of a comprehensive MRSA colonization surveillance program. It is not intended to diagnose MRSA infection nor to guide or monitor treatment for MRSA infections. Performed at [facility], [place] [address] [place], [place] [address]   Culture, Urine     Status: Abnormal   Collection Time: 01/08/20 10:41 PM   Specimen: Urine, Catheterized  Result Value Ref Range Status   Specimen Description URINE, CATHETERIZED  Final   Special Requests   Final    NONE Performed at [facility], [place] [address], [place], [place] [address]    Culture 10,000 COLONIES/mL ESCHERICHIA COLI (A)  Final   Report Status 01/11/2020 FINAL  Final   Organism ID, Bacteria ESCHERICHIA COLI (A)  Final      Susceptibility   Escherichia coli - MIC*    AMPICILLIN 4 SENSITIVE Sensitive     CEFAZOLIN <=4 SENSITIVE Sensitive     CEFTRIAXONE <=0.25 SENSITIVE Sensitive     CIPROFLOXACIN <=0.25 SENSITIVE Sensitive     GENTAMICIN <=1 SENSITIVE Sensitive     IMIPENEM <=0.25 SENSITIVE Sensitive     NITROFURANTOIN <=16 SENSITIVE Sensitive     TRIMETH/SULFA <=20 SENSITIVE Sensitive     AMPICILLIN/SULBACTAM <=2 SENSITIVE Sensitive     PIP/TAZO <=4 SENSITIVE Sensitive     * 10,000 COLONIES/mL ESCHERICHIA COLI  Culture, blood (single)     Status: None   Collection Time: 01/08/20 11:45 PM   Specimen: BLOOD  Result Value Ref Range Status   Specimen Description BLOOD LEFT ARM  Final   Special Requests   Final    BOTTLES DRAWN AEROBIC AND ANAEROBIC Blood Culture results may not be optimal due to an excessive volume of blood received in culture bottles   Culture   Final    NO GROWTH 5 DAYS Performed at [facility], [place] [address] [place], [place] [address]    Report Status 01/14/2020 FINAL  Final     Radiology Studies: No results found.  Scheduled Meds: . amLODipine  5 mg Oral Daily  . Chlorhexidine Gluconate Cloth  6 each Topical Q0600  . feeding  supplement (ENSURE ENLIVE)  237 mL Oral BID BM  . heparin  5,000 Units Subcutaneous Q8H  . insulin aspart  0-5 Units Subcutaneous QHS  . insulin aspart  0-9 Units Subcutaneous TID WC  . insulin glargine  5 Units Subcutaneous Daily  . ipratropium-albuterol  3 mL Nebulization BID  . mouth rinse  15 mL Mouth Rinse BID  . polyethylene glycol  17 g Oral BID  . senna  2 tablet Oral BID  . sodium bicarbonate  650 mg Oral BID   Continuous Infusions: . sodium chloride Stopped (01/13/20 0735)  . sodium chloride 100 mL/hr at 01/14/20 1200  . cefTRIAXone (ROCEPHIN)  IV Stopped (01/14/20  0030)     LOS: 6 days   Marylu Lund, MD [facility] Pager On Amion  If 7PM-7AM, please contact night-coverage 01/14/2020, 1:54 PM

## 2020-01-15 DIAGNOSIS — N179 Acute kidney failure, unspecified: Secondary | ICD-10-CM | POA: Diagnosis not present

## 2020-01-15 DIAGNOSIS — Z452 Encounter for adjustment and management of vascular access device: Secondary | ICD-10-CM | POA: Diagnosis not present

## 2020-01-15 LAB — GLUCOSE, CAPILLARY
Glucose-Capillary: 86 mg/dL (ref 70–99)
Glucose-Capillary: 139 mg/dL — ABNORMAL HIGH (ref 70–99)
Glucose-Capillary: 142 mg/dL — ABNORMAL HIGH (ref 70–99)
Glucose-Capillary: 154 mg/dL — ABNORMAL HIGH (ref 70–99)

## 2020-01-15 LAB — RENAL FUNCTION PANEL
Albumin: 2.1 g/dL — ABNORMAL LOW (ref 3.5–5.0)
Anion gap: 14 (ref 5–15)
BUN: 62 mg/dL — ABNORMAL HIGH (ref 8–23)
CO2: 19 mmol/L — ABNORMAL LOW (ref 22–32)
Calcium: 7.1 mg/dL — ABNORMAL LOW (ref 8.9–10.3)
Chloride: 102 mmol/L (ref 98–111)
Creatinine, Ser: 5.54 mg/dL — ABNORMAL HIGH (ref 0.44–1.00)
GFR calc Af Amer: 8 mL/min — ABNORMAL LOW (ref >60)
GFR calc non Af Amer: 7 mL/min — ABNORMAL LOW (ref >60)
Glucose, Bld: 101 mg/dL — ABNORMAL HIGH (ref 70–99)
Phosphorus: 2.8 mg/dL (ref 2.5–4.6)
Potassium: 3.5 mmol/L (ref 3.5–5.1)
Sodium: 135 mmol/L (ref 135–145)

## 2020-01-15 LAB — CBC WITH DIFFERENTIAL/PLATELET
Abs Immature Granulocytes: 0.17 10*3/uL — ABNORMAL HIGH (ref 0.00–0.07)
Basophils Absolute: 0 10*3/uL (ref 0.0–0.1)
Basophils Relative: 0 %
Eosinophils Absolute: 0.1 10*3/uL (ref 0.0–0.5)
Eosinophils Relative: 2 %
HCT: 29.6 % — ABNORMAL LOW (ref 36.0–46.0)
Hemoglobin: 9.7 g/dL — ABNORMAL LOW (ref 12.0–15.0)
Immature Granulocytes: 2 %
Lymphocytes Relative: 13 %
Lymphs Abs: 1.1 10*3/uL (ref 0.7–4.0)
MCH: 29 pg (ref 26.0–34.0)
MCHC: 32.8 g/dL (ref 30.0–36.0)
MCV: 88.4 fL (ref 80.0–100.0)
Monocytes Absolute: 0.5 10*3/uL (ref 0.1–1.0)
Monocytes Relative: 6 %
Neutro Abs: 6.9 10*3/uL (ref 1.7–7.7)
Neutrophils Relative %: 77 %
Platelets: 136 10*3/uL — ABNORMAL LOW (ref 150–400)
RBC: 3.35 MIL/uL — ABNORMAL LOW (ref 3.87–5.11)
RDW: 13.9 % (ref 11.5–15.5)
WBC: 8.9 10*3/uL (ref 4.0–10.5)
nRBC: 0 % (ref 0.0–0.2)

## 2020-01-15 MED ORDER — SODIUM BICARBONATE 650 MG PO TABS
1300.0000 mg | ORAL_TABLET | Freq: Two times a day (BID) | ORAL | Status: DC
  Administered 2020-01-15 – 2020-01-30 (×31): 1300 mg via ORAL
  Filled 2020-01-15 (×32): qty 2

## 2020-01-15 NOTE — Progress Notes (Addendum)
PROGRESS NOTE    Cindy Burns  XBD:[id number] DOB: March 24, 1946 DOA: 01/08/2020 PCP: [name], MD    Brief Narrative:  74yo with hx of breast cancer, COPD, MDD, and DM presented to [facility] with sob, found to be in ARF with high anion gap acidosis. Transferred to [facility] for further work up.  Assessment & Plan:   Active Problems:   Renal failure   AKI (acute kidney injury) ([place])   Pressure injury of skin  Acidosis secondary toDKA and renal failure -glucose trends now much stable and seems controlled -Cr slightly worse today per below. Nephrology cont to follow -Per Nephro, cont HD cath for now and cont IVF -Repeat bmet in AM -Continue on sodium bicarb as tolerated  Acute hypoxemic respiratory failure -Did require intubation initially, since extubated -Pt remains on room air. Resolved  ARF -Cr of 15.43 as of 4/3 -Nephrology following, pt is s/p CRRT in ICU -Cr slightly higher -HD on hold per nephrology, plan to cont HD cath for another day -Concerns for dehydration with IVF continued per Nephrology -Recheck bmet in AM  Normocytic anemia -Remains hemodynamically stable at this time -Hgb currently stable at 9.1 -Will repeat CBC in AM  UTI -Currently on day 6 of rocephin, planning to complete 7 day course -Pan-sensitive ecoli noted on urine culture. Blood cx reviewed and noted to be neg  COPD -currently on minimal O2 support -Continued on duoneb as per RT  HTN -BP stable at this time -Cont on norvasc and PRN labetalol as needed  DM -Glucose overall stable -Now on lantus 5 units as was recommended by Diabetic Coordinator earlier -Cont SSI coverage -Glucose trends improved and now stable  DVT prophylaxis: Heparin subq Code Status: Full Family Communication: Pt in room, family not at bedside Status is: Inpatient  Remains inpatient appropriate because:Persistent severe electrolyte disturbances   Dispo: The patient is from: Home              Anticipated  d/c is to: SNF              Anticipated d/c date is: > 3 days              Patient currently is not medically stable to d/c.         Consultants:   PCCM  Nephrology  Procedures:   CRRT  Antimicrobials: Anti-infectives (From admission, onward)   Start     Dose/Rate Route Frequency Ordered Stop   01/08/20 2315  cefTRIAXone (ROCEPHIN) 1 g in sodium chloride 0.9 % 100 mL IVPB     1 g 200 mL/hr over 30 Minutes Intravenous Every 24 hours 01/08/20 2224 01/14/20 2313      Subjective: No complaints  Objective: Vitals:   01/14/20 2122 01/15/20 0433 01/15/20 0914 01/15/20 1635  BP: 98/62 117/76 (!) 180/68 (!) 167/75  Pulse: 83 79 78 72  Resp: 18 19 16 18   Temp: 98.8 F (37.1 C) 98.2 F (36.8 C) 97.7 F (36.5 C) 98.1 F (36.7 C)  TempSrc: Oral Oral Oral Oral  SpO2: 96% 100% 100% 100%  Weight: 55.8 kg       Intake/Output Summary (Last 24 hours) at 01/15/2020 1728 Last data filed at 01/15/2020 1100 Gross per 24 hour  Intake 2457 ml  Output 1725 ml  Net 732 ml   Filed Weights   01/12/20 0500 01/14/20 0500 01/14/20 2122  Weight: 53.9 kg 53.4 kg 55.8 kg    Examination: General exam: laying in bed, in no  acute distress Respiratory system: normal chest rise, clear, no audible wheezing Cardiovascular system: regular rhythm, s1-s2 Gastrointestinal system: Nondistended, nontender, pos BS Central nervous system: No seizures, no tremors Extremities: No cyanosis, no joint deformities Skin: No rashes, no pallor Psychiatry: unable to assess given mentation   Data Reviewed: I have personally reviewed following labs and imaging studies  CBC: Recent Labs  Lab 01/11/20 1645 01/12/20 0600 01/13/20 0507 01/14/20 0257 01/15/20 0537  WBC 7.5 6.5 6.6 10.0 8.9  NEUTROABS 6.2 5.0 5.0 8.3* 6.9  HGB 9.5* 9.3* 9.1* 8.9* 9.7*  HCT 28.9* 28.6* 28.0* 27.1* 29.6*  MCV 88.7 88.8 87.8 86.9 88.4  PLT 104* 90* 111* 131* 235*   Basic Metabolic Panel: Recent Labs  Lab  01/08/20 2210 01/08/20 2356 01/10/20 0737 01/10/20 0737 01/10/20 1651 01/10/20 1651 01/11/20 0527 01/11/20 0527 01/11/20 1645 01/11/20 1645 01/12/20 0600 01/12/20 1601 01/13/20 0507 01/14/20 0257 01/15/20 0537  NA 132*   < > 137   < > 134*   < > 136   < > 135   < > 139 134* 134* 135 135  K 4.3   < > 4.3   < > 4.1   < > 3.9   < > 4.0   < > 3.9 4.2 3.8 3.6 3.5  CL 98   < > 102   < > 98   < > 101   < > 101   < > 104 99 97* 99 102  CO2 8*   < > 24   < > 24   < > 25   < > 24   < > 19* 21* 21* 20* 19*  GLUCOSE 323*   < > 101*   < > 106*   < > 121*   < > 96   < > 79 80 110* 106* 101*  BUN 196*   < > 26*   < > 21   < > 21   < > 24*   < > 36* 40* 48* 60* 62*  CREATININE 13.36*   < > 2.67*   < > 2.18*   < > 1.72*   < > 2.39*   < > 3.64* 4.07* 5.06* 5.35* 5.54*  CALCIUM 5.6*   < > 7.3*   < > 7.2*   < > 7.3*   < > 7.3*   < > 7.4* 7.5* 7.4* 7.5* 7.1*  MG 1.2*  --  2.2  --  2.2  --  2.2  --  2.3  --   --   --   --   --   --   PHOS >30.0*   < > 2.6   < > 2.2*  2.3*   < > 1.9*  1.8*   < > 2.3*  2.4*   < > 2.8 3.0 3.5 2.8 2.8   < > = values in this interval not displayed.   GFR: Estimated Creatinine Clearance: 7.4 mL/min (A) (by C-G formula based on SCr of 5.54 mg/dL (H)). Liver Function Tests: Recent Labs  Lab 01/08/20 2210 01/09/20 0940 01/12/20 0600 01/12/20 1601 01/13/20 0507 01/14/20 0257 01/15/20 0537  AST 18  --   --   --   --   --   --   ALT 21  --   --   --   --   --   --   ALKPHOS 87  --   --   --   --   --   --  BILITOT 0.8  --   --   --   --   --   --   PROT 5.3*  --   --   --   --   --   --   ALBUMIN 2.1*   < > 2.0* 2.3* 2.1* 2.2* 2.1*   < > = values in this interval not displayed.   No results for input(s): LIPASE, AMYLASE in the last 168 hours. No results for input(s): AMMONIA in the last 168 hours. Coagulation Profile: Recent Labs  Lab 01/08/20 2345  INR 1.4*   Cardiac Enzymes: No results for input(s): CKTOTAL, CKMB, CKMBINDEX, TROPONINI in the last 168  hours. BNP (last 3 results) No results for input(s): PROBNP in the last 8760 hours. HbA1C: No results for input(s): HGBA1C in the last 72 hours. CBG: Recent Labs  Lab 01/14/20 1732 01/14/20 2129 01/15/20 0629 01/15/20 1143 01/15/20 1633  GLUCAP 170* 102* 86 139* 142*   Lipid Profile: No results for input(s): CHOL, HDL, LDLCALC, TRIG, CHOLHDL, LDLDIRECT in the last 72 hours. Thyroid Function Tests: No results for input(s): TSH, T4TOTAL, FREET4, T3FREE, THYROIDAB in the last 72 hours. Anemia Panel: No results for input(s): VITAMINB12, FOLATE, FERRITIN, TIBC, IRON, RETICCTPCT in the last 72 hours. Sepsis Labs: Recent Labs  Lab 01/08/20 1811 01/08/20 2220 01/09/20 0247  LATICACIDVEN 3.7* 2.1* 1.2    Recent Results (from the past 240 hour(s))  Respiratory Panel by RT PCR (Flu A&B, Covid) - Nasopharyngeal Swab     Status: None   Collection Time: 01/08/20  2:12 PM   Specimen: Nasopharyngeal Swab  Result Value Ref Range Status   SARS Coronavirus 2 by RT PCR NEGATIVE NEGATIVE Final    Comment: (NOTE) SARS-CoV-2 target nucleic acids are NOT DETECTED. The SARS-CoV-2 RNA is generally detectable in upper respiratoy specimens during the acute phase of infection. The lowest concentration of SARS-CoV-2 viral copies this assay can detect is 131 copies/mL. A negative result does not preclude SARS-Cov-2 infection and should not be used as the sole basis for treatment or other patient management decisions. A negative result may occur with  improper specimen collection/handling, submission of specimen other than nasopharyngeal swab, presence of viral mutation(s) within the areas targeted by this assay, and inadequate number of viral copies (<131 copies/mL). A negative result must be combined with clinical observations, patient history, and epidemiological information. The expected result is Negative. Fact Sheet for Patients:  PinkCheek.be Fact Sheet for  Healthcare Providers:  GravelBags.it This test is not yet ap proved or cleared by the [place] FDA and  has been authorized for detection and/or diagnosis of SARS-CoV-2 by FDA under an Emergency Use Authorization (EUA). This EUA will remain  in effect (meaning this test can be used) for the duration of the COVID-19 declaration under Section 564(b)(1) of the Act, 21 U.S.C. section 360bbb-3(b)(1), unless the authorization is terminated or revoked sooner.    Influenza A by PCR NEGATIVE NEGATIVE Final   Influenza B by PCR NEGATIVE NEGATIVE Final    Comment: (NOTE) The Xpert Xpress SARS-CoV-2/FLU/RSV assay is intended as an aid in  the diagnosis of influenza from Nasopharyngeal swab specimens and  should not be used as a sole basis for treatment. Nasal washings and  aspirates are unacceptable for Xpert Xpress SARS-CoV-2/FLU/RSV  testing. Fact Sheet for Patients: PinkCheek.be Fact Sheet for Healthcare Providers: GravelBags.it This test is not yet approved or cleared by the [place] FDA and  has been authorized for detection and/or diagnosis of  SARS-CoV-2 by  FDA under an Emergency Use Authorization (EUA). This EUA will remain  in effect (meaning this test can be used) for the duration of the  Covid-19 declaration under Section 564(b)(1) of the Act, 21  U.S.C. section 360bbb-3(b)(1), unless the authorization is  terminated or revoked. Performed at [facility], [address] [place], [place] [address]   Blood culture (single)     Status: None   Collection Time: 01/08/20  2:12 PM   Specimen: BLOOD  Result Value Ref Range Status   Specimen Description BLOOD RIGHT ANTECUBITAL  Final   Special Requests   Final    BOTTLES DRAWN AEROBIC AND ANAEROBIC Blood Culture results may not be optimal due to an excessive volume of blood received in culture bottles   Culture   Final    NO GROWTH  5 DAYS Performed at [facility], [address] [place], [place] [address]    Report Status 01/13/2020 FINAL  Final  MRSA PCR Screening     Status: None   Collection Time: 01/08/20 10:20 PM   Specimen: Nasopharyngeal  Result Value Ref Range Status   MRSA by PCR NEGATIVE NEGATIVE Final    Comment:        The GeneXpert MRSA Assay (FDA approved for NASAL specimens only), is one component of a comprehensive MRSA colonization surveillance program. It is not intended to diagnose MRSA infection nor to guide or monitor treatment for MRSA infections. Performed at [facility], [place] [address], [place], [place] [address]   Culture, Urine     Status: Abnormal   Collection Time: 01/08/20 10:41 PM   Specimen: Urine, Catheterized  Result Value Ref Range Status   Specimen Description URINE, CATHETERIZED  Final   Special Requests   Final    NONE Performed at [facility], [place] [address], [place], [place] [address]    Culture 10,000 COLONIES/mL ESCHERICHIA COLI (A)  Final   Report Status 01/11/2020 FINAL  Final   Organism ID, Bacteria ESCHERICHIA COLI (A)  Final      Susceptibility   Escherichia coli - MIC*    AMPICILLIN 4 SENSITIVE Sensitive     CEFAZOLIN <=4 SENSITIVE Sensitive     CEFTRIAXONE <=0.25 SENSITIVE Sensitive     CIPROFLOXACIN <=0.25 SENSITIVE Sensitive     GENTAMICIN <=1 SENSITIVE Sensitive     IMIPENEM <=0.25 SENSITIVE Sensitive     NITROFURANTOIN <=16 SENSITIVE Sensitive     TRIMETH/SULFA <=20 SENSITIVE Sensitive     AMPICILLIN/SULBACTAM <=2 SENSITIVE Sensitive     PIP/TAZO <=4 SENSITIVE Sensitive     * 10,000 COLONIES/mL ESCHERICHIA COLI  Culture, blood (single)     Status: None   Collection Time: 01/08/20 11:45 PM   Specimen: BLOOD  Result Value Ref Range Status   Specimen Description BLOOD LEFT ARM  Final   Special Requests   Final    BOTTLES DRAWN AEROBIC AND ANAEROBIC Blood Culture results may not be optimal due to an excessive volume of  blood received in culture bottles   Culture   Final    NO GROWTH 5 DAYS Performed at [facility], [place] [address] [place],  [address]    Report Status 01/14/2020 FINAL  Final     Radiology Studies: No results found.  Scheduled Meds: . amLODipine  5 mg Oral Daily  . Chlorhexidine Gluconate Cloth  6 each Topical Q0600  . feeding supplement (ENSURE ENLIVE)  237 mL Oral BID BM  .  heparin  5,000 Units Subcutaneous Q8H  . insulin aspart  0-5 Units Subcutaneous QHS  . insulin aspart  0-9 Units Subcutaneous TID WC  . insulin glargine  5 Units Subcutaneous Daily  . mouth rinse  15 mL Mouth Rinse BID  . polyethylene glycol  17 g Oral BID  . senna  2 tablet Oral BID  . sodium bicarbonate  1,300 mg Oral BID   Continuous Infusions: . sodium chloride Stopped (01/13/20 0735)  . sodium chloride 100 mL/hr at 01/15/20 1625     LOS: 7 days   Marylu Lund, MD [facility] Pager On Amion  If 7PM-7AM, please contact night-coverage 01/15/2020, 5:28 PM

## 2020-01-15 NOTE — Progress Notes (Signed)
[facility] NEPHROLOGY PROGRESS NOTE  Assessment/ Plan: Pt is a 74 y.o. yo female with COPD, DM, breast cancer, admitted with respiratory failure on mechanical ventilation found to have DKA, UTI and severe AKI requiring CRRT.  #Severe acute kidney injury likely due to severe dehydration, UTI and DKA: UA with possible UTI.   Kidney US with no hydronephrosis relatively small right kidney.  Severely acidemic on presentation required CRRT until 4/6. Urine output is increasing.  Hopefully the creatinine level will plateau in next 1 to 2 days.  I will continue IV fluid and watch for renal recovery.  She has no uremic features to warrant dialysis today however, given continued rise in creatinine level I will keep HD catheter. Strict ins and out.      #Nephrotic range proteinuria likely diabetic nephropathy: Patient has A1c 8.5.  Needs outpatient follow-up.  #Hyperkalemia: Improved with dialysis.  #Hypertension: Blood pressure variable.  Started amlodipine.  #Metabolic acidosis: Increase sodium bicarbonate dose.    #Acute hypoxic respiratory failure: Extubated and in room air.  Subjective: Seen and examined.  Urine output of around 2 L.  Denies nausea vomiting chest pain, shortness of breath.  No new event.  No tremor, asterixis. Objective Vital signs in last 24 hours: Vitals:   01/14/20 2100 01/14/20 2122 01/15/20 0433 01/15/20 0914  BP: (!) 116/96 98/62 117/76 (!) 180/68  Pulse: (!) 102 83 79 78  Resp: (!) 30 18 19 16   Temp:  98.8 F (37.1 C) 98.2 F (36.8 C) 97.7 F (36.5 C)  TempSrc:  Oral Oral Oral  SpO2: 100% 96% 100% 100%  Weight:  55.8 kg     Weight change: 2.4 kg  Intake/Output Summary (Last 24 hours) at 01/15/2020 1102 Last data filed at 01/15/2020 0634 Gross per 24 hour  Intake 2979.86 ml  Output 1350 ml  Net 1629.86 ml       Labs: Basic Metabolic Panel: Recent Labs  Lab 01/13/20 0507 01/14/20 0257 01/15/20 0537  NA 134* 135 135  K 3.8 3.6 3.5   CL 97* 99 102  CO2 21* 20* 19*  GLUCOSE 110* 106* 101*  BUN 48* 60* 62*  CREATININE 5.06* 5.35* 5.54*  CALCIUM 7.4* 7.5* 7.1*  PHOS 3.5 2.8 2.8   Liver Function Tests: Recent Labs  Lab 01/08/20 1412 01/08/20 1412 01/08/20 2210 01/09/20 0940 01/13/20 0507 01/14/20 0257 01/15/20 0537  AST 17  --  18  --   --   --   --   ALT 26  --  21  --   --   --   --   ALKPHOS 120  --  87  --   --   --   --   BILITOT 0.9  --  0.8  --   --   --   --   PROT 8.2*  --  5.3*  --   --   --   --   ALBUMIN 3.5   < > 2.1*   < > 2.1* 2.2* 2.1*   < > = values in this interval not displayed.   No results for input(s): LIPASE, AMYLASE in the last 168 hours. No results for input(s): AMMONIA in the last 168 hours. CBC: Recent Labs  Lab 01/11/20 1645 01/11/20 1645 01/12/20 0600 01/12/20 0600 01/13/20 0507 01/14/20 0257 01/15/20 0537  WBC 7.5   < > 6.5   < > 6.6 10.0 8.9  NEUTROABS 6.2   < > 5.0   < > 5.0  8.3* 6.9  HGB 9.5*   < > 9.3*   < > 9.1* 8.9* 9.7*  HCT 28.9*   < > 28.6*   < > 28.0* 27.1* 29.6*  MCV 88.7  --  88.8  --  87.8 86.9 88.4  PLT 104*   < > 90*   < > 111* 131* 136*   < > = values in this interval not displayed.   Cardiac Enzymes: Recent Labs  Lab 01/08/20 1622  CKTOTAL 178   CBG: Recent Labs  Lab 01/14/20 1125 01/14/20 1512 01/14/20 1732 01/14/20 2129 01/15/20 0629  GLUCAP 273* 237* 170* 102* 86    Iron Studies:  No results for input(s): IRON, TIBC, TRANSFERRIN, FERRITIN in the last 72 hours. Studies/Results: No results found.  Medications: Infusions: . sodium chloride Stopped (01/13/20 0735)  . sodium chloride 100 mL/hr at 01/15/20 7371    Scheduled Medications: . amLODipine  5 mg Oral Daily  . Chlorhexidine Gluconate Cloth  6 each Topical Q0600  . feeding supplement (ENSURE ENLIVE)  237 mL Oral BID BM  . heparin  5,000 Units Subcutaneous Q8H  . insulin aspart  0-5 Units Subcutaneous QHS  . insulin aspart  0-9 Units Subcutaneous TID WC  . insulin  glargine  5 Units Subcutaneous Daily  . mouth rinse  15 mL Mouth Rinse BID  . polyethylene glycol  17 g Oral BID  . senna  2 tablet Oral BID  . sodium bicarbonate  650 mg Oral BID    have reviewed scheduled and prn medications.  Physical Exam: General: Pleasant female sitting on bed, not in distress.  She has some confusion. Heart:RRR, s1s2 nl, no rubs Lungs: Clear bilateral, no wheezing Abdomen:soft, Non-tender, non-distended Extremities:No LE edema Dialysis Access: Left IJ temporary HD catheter  Jakari Jacot Tanna Furry 01/15/2020,11:02 AM  LOS: 7 days  Pager: [phone]

## 2020-01-15 NOTE — Progress Notes (Signed)
Patient was seen by passerby on the hallway,was inching herself toward the right foot side of the bed with raised side rail.Staffs attention called , the patient was able to get up by pushing herself up between the foot board and siderail but the patient slid down the floor.Patient had a soft landing on her buttocks as her right hand still holding the raised right side rail.Assessed ,patient was able to answer where she at but unable to give correct answer for the rest of the questions. Not complaining of anything.No obvious physical injury.Will monitor.Niece called and M.D on call made aware.

## 2020-01-16 ENCOUNTER — Inpatient Hospital Stay (HOSPITAL_COMMUNITY): Payer: Medicare Other

## 2020-01-16 DIAGNOSIS — Z452 Encounter for adjustment and management of vascular access device: Secondary | ICD-10-CM | POA: Diagnosis not present

## 2020-01-16 DIAGNOSIS — M25472 Effusion, left ankle: Secondary | ICD-10-CM | POA: Diagnosis not present

## 2020-01-16 DIAGNOSIS — M25572 Pain in left ankle and joints of left foot: Secondary | ICD-10-CM | POA: Diagnosis not present

## 2020-01-16 DIAGNOSIS — Z515 Encounter for palliative care: Secondary | ICD-10-CM

## 2020-01-16 DIAGNOSIS — N179 Acute kidney failure, unspecified: Secondary | ICD-10-CM | POA: Diagnosis not present

## 2020-01-16 LAB — CBC WITH DIFFERENTIAL/PLATELET
Abs Immature Granulocytes: 0.13 10*3/uL — ABNORMAL HIGH (ref 0.00–0.07)
Basophils Absolute: 0 10*3/uL (ref 0.0–0.1)
Basophils Relative: 0 %
Eosinophils Absolute: 0.1 10*3/uL (ref 0.0–0.5)
Eosinophils Relative: 3 %
HCT: 26.2 % — ABNORMAL LOW (ref 36.0–46.0)
Hemoglobin: 8.3 g/dL — ABNORMAL LOW (ref 12.0–15.0)
Immature Granulocytes: 3 %
Lymphocytes Relative: 16 %
Lymphs Abs: 0.7 10*3/uL (ref 0.7–4.0)
MCH: 28.2 pg (ref 26.0–34.0)
MCHC: 31.7 g/dL (ref 30.0–36.0)
MCV: 89.1 fL (ref 80.0–100.0)
Monocytes Absolute: 0.4 10*3/uL (ref 0.1–1.0)
Monocytes Relative: 8 %
Neutro Abs: 3.1 10*3/uL (ref 1.7–7.7)
Neutrophils Relative %: 70 %
Platelets: 147 10*3/uL — ABNORMAL LOW (ref 150–400)
RBC: 2.94 MIL/uL — ABNORMAL LOW (ref 3.87–5.11)
RDW: 14.3 % (ref 11.5–15.5)
WBC: 4.5 10*3/uL (ref 4.0–10.5)
nRBC: 0 % (ref 0.0–0.2)

## 2020-01-16 LAB — RENAL FUNCTION PANEL
Albumin: 1.9 g/dL — ABNORMAL LOW (ref 3.5–5.0)
Anion gap: 11 (ref 5–15)
BUN: 60 mg/dL — ABNORMAL HIGH (ref 8–23)
CO2: 19 mmol/L — ABNORMAL LOW (ref 22–32)
Calcium: 7 mg/dL — ABNORMAL LOW (ref 8.9–10.3)
Chloride: 106 mmol/L (ref 98–111)
Creatinine, Ser: 5.65 mg/dL — ABNORMAL HIGH (ref 0.44–1.00)
GFR calc Af Amer: 8 mL/min — ABNORMAL LOW (ref >60)
GFR calc non Af Amer: 7 mL/min — ABNORMAL LOW (ref >60)
Glucose, Bld: 84 mg/dL (ref 70–99)
Phosphorus: 3.6 mg/dL (ref 2.5–4.6)
Potassium: 3.6 mmol/L (ref 3.5–5.1)
Sodium: 136 mmol/L (ref 135–145)

## 2020-01-16 LAB — GLUCOSE, CAPILLARY
Glucose-Capillary: 87 mg/dL (ref 70–99)
Glucose-Capillary: 113 mg/dL — ABNORMAL HIGH (ref 70–99)
Glucose-Capillary: 159 mg/dL — ABNORMAL HIGH (ref 70–99)

## 2020-01-16 NOTE — Progress Notes (Signed)
Low bed ordered for the patient.

## 2020-01-16 NOTE — Progress Notes (Signed)
PROGRESS NOTE    Cindy Burns  XNT:[id number] DOB: 1946-05-20 DOA: 01/08/2020 PCP: [name], MD    Brief Narrative:  74yo with hx of breast cancer, COPD, MDD, and DM presented to [place] with sob, found to be in ARF with high anion gap acidosis. Transferred to [facility] for further work up.  Assessment & Plan:   Active Problems:   Renal failure   AKI (acute kidney injury) ([place])   Pressure injury of skin  Acidosis secondary toDKA and renal failure -glucose trends remain stable and seems controlled -Cr slightly worse today per below. Nephrology cont to follow -Per Nephro, cont HD cath for now and cont IVF -Bladder scan per Nephrology -Continue on sodium bicarb as tolerated  Acute hypoxemic respiratory failure -Did require intubation initially, since extubated -Currently remains on room air. Resolved  ARF -Cr of 15.43 as of 4/3 -Nephrology following, pt is s/p CRRT in ICU -Cr slightly higher today -HD on hold per nephrology, plan to cont HD cath for another day -Concerns for dehydration with IVF continued per Nephrology -Repeat bmet in AM  Normocytic anemia -Remains hemodynamically stable at this time -Hgb slightly lower at 8.3. No evidence of acute blood loss -Will repeat CBC in AM  UTI -Currently on day 6 of rocephin, planning to complete 7 day course -Pan-sensitive ecoli noted on urine culture. Blood cx reviewed and noted to be neg  COPD -currently on minimal O2 support -Continued on duoneb as needed per RT  HTN -BP stable at this time -Cont on norvasc and PRN labetalol as tolerated  DM -Glucose overall stable -Now on lantus 5 units as was recommended by Diabetic Coordinator earlier -Cont SSI coverage -Glucose trends improved and now stable  L ankle swelling and decreased ROM -have ordered and reviewed ankle xray. Normal study -Will continue with PT/OT as tolerated  DVT prophylaxis: Heparin subq Code Status: Full Family Communication: Pt in room,  family not at bedside Status is: Inpatient  Remains inpatient appropriate because:Persistent severe electrolyte disturbances   Dispo: The patient is from: Home              Anticipated d/c is to: SNF              Anticipated d/c date is: > 3 days              Patient currently is not medically stable to d/c.         Consultants:   PCCM  Nephrology  Procedures:   CRRT  Antimicrobials: Anti-infectives (From admission, onward)   Start     Dose/Rate Route Frequency Ordered Stop   01/08/20 2315  cefTRIAXone (ROCEPHIN) 1 g in sodium chloride 0.9 % 100 mL IVPB     1 g 200 mL/hr over 30 Minutes Intravenous Every 24 hours 01/08/20 2224 01/14/20 2313      Subjective: Complains of L ankle swelling and decreased ROM, no recent trauma however pt did recall fall prior to admit  Objective: Vitals:   01/15/20 1947 01/15/20 2345 01/16/20 0421 01/16/20 1000  BP: (!) 180/67 (!) 149/49 (!) 152/57 (!) 147/60  Pulse: 75 75 75 78  Resp: 18 18 18 18   Temp: 97.7 F (36.5 C) 98.2 F (36.8 C) 98.4 F (36.9 C) 98.6 F (37 C)  TempSrc: Oral Oral Oral Oral  SpO2: 100% 98% 98% 98%  Weight:        Intake/Output Summary (Last 24 hours) at 01/16/2020 1727 Last data filed at 01/16/2020 1300 Gross  per 24 hour  Intake 2370.96 ml  Output 450 ml  Net 1920.96 ml   Filed Weights   01/12/20 0500 01/14/20 0500 01/14/20 2122  Weight: 53.9 kg 53.4 kg 55.8 kg    Examination: General exam: Awake, laying in bed, in nad Respiratory system: Normal respiratory effort, no wheezing Cardiovascular system: regular rate, s1, s2 Gastrointestinal system: Soft, nondistended, positive BS Central nervous system: CN2-12 grossly intact, strength intact Extremities: Perfused, no clubbing, L ankle swelling Skin: Normal skin turgor, no notable skin lesions seen Psychiatry: Mood normal // no visual hallucinations   Data Reviewed: I have personally reviewed following labs and imaging studies  CBC: Recent  Labs  Lab 01/12/20 0600 01/13/20 0507 01/14/20 0257 01/15/20 0537 01/16/20 0601  WBC 6.5 6.6 10.0 8.9 4.5  NEUTROABS 5.0 5.0 8.3* 6.9 3.1  HGB 9.3* 9.1* 8.9* 9.7* 8.3*  HCT 28.6* 28.0* 27.1* 29.6* 26.2*  MCV 88.8 87.8 86.9 88.4 89.1  PLT 90* 111* 131* 136* 924*   Basic Metabolic Panel: Recent Labs  Lab 01/10/20 0737 01/10/20 0737 01/10/20 1651 01/10/20 1651 01/11/20 0527 01/11/20 0527 01/11/20 1645 01/12/20 0600 01/12/20 1601 01/13/20 0507 01/14/20 0257 01/15/20 0537 01/16/20 0601  NA 137   < > 134*   < > 136   < > 135   < > 134* 134* 135 135 136  K 4.3   < > 4.1   < > 3.9   < > 4.0   < > 4.2 3.8 3.6 3.5 3.6  CL 102   < > 98   < > 101   < > 101   < > 99 97* 99 102 106  CO2 24   < > 24   < > 25   < > 24   < > 21* 21* 20* 19* 19*  GLUCOSE 101*   < > 106*   < > 121*   < > 96   < > 80 110* 106* 101* 84  BUN 26*   < > 21   < > 21   < > 24*   < > 40* 48* 60* 62* 60*  CREATININE 2.67*   < > 2.18*   < > 1.72*   < > 2.39*   < > 4.07* 5.06* 5.35* 5.54* 5.65*  CALCIUM 7.3*   < > 7.2*   < > 7.3*   < > 7.3*   < > 7.5* 7.4* 7.5* 7.1* 7.0*  MG 2.2  --  2.2  --  2.2  --  2.3  --   --   --   --   --   --   PHOS 2.6   < > 2.2*  2.3*   < > 1.9*  1.8*   < > 2.3*  2.4*   < > 3.0 3.5 2.8 2.8 3.6   < > = values in this interval not displayed.   GFR: Estimated Creatinine Clearance: 7.2 mL/min (A) (by C-G formula based on SCr of 5.65 mg/dL (H)). Liver Function Tests: Recent Labs  Lab 01/12/20 1601 01/13/20 0507 01/14/20 0257 01/15/20 0537 01/16/20 0601  ALBUMIN 2.3* 2.1* 2.2* 2.1* 1.9*   No results for input(s): LIPASE, AMYLASE in the last 168 hours. No results for input(s): AMMONIA in the last 168 hours. Coagulation Profile: No results for input(s): INR, PROTIME in the last 168 hours. Cardiac Enzymes: No results for input(s): CKTOTAL, CKMB, CKMBINDEX, TROPONINI in the last 168 hours. BNP (last 3 results) No results for input(s): PROBNP  in the last 8760 hours. HbA1C: No  results for input(s): HGBA1C in the last 72 hours. CBG: Recent Labs  Lab 01/15/20 1143 01/15/20 1633 01/15/20 2219 01/16/20 0618 01/16/20 1150  GLUCAP 139* 142* 154* 87 113*   Lipid Profile: No results for input(s): CHOL, HDL, LDLCALC, TRIG, CHOLHDL, LDLDIRECT in the last 72 hours. Thyroid Function Tests: No results for input(s): TSH, T4TOTAL, FREET4, T3FREE, THYROIDAB in the last 72 hours. Anemia Panel: No results for input(s): VITAMINB12, FOLATE, FERRITIN, TIBC, IRON, RETICCTPCT in the last 72 hours. Sepsis Labs: No results for input(s): PROCALCITON, LATICACIDVEN in the last 168 hours.  Recent Results (from the past 240 hour(s))  Respiratory Panel by RT PCR (Flu A&B, Covid) - Nasopharyngeal Swab     Status: None   Collection Time: 01/08/20  2:12 PM   Specimen: Nasopharyngeal Swab  Result Value Ref Range Status   SARS Coronavirus 2 by RT PCR NEGATIVE NEGATIVE Final    Comment: (NOTE) SARS-CoV-2 target nucleic acids are NOT DETECTED. The SARS-CoV-2 RNA is generally detectable in upper respiratoy specimens during the acute phase of infection. The lowest concentration of SARS-CoV-2 viral copies this assay can detect is 131 copies/mL. A negative result does not preclude SARS-Cov-2 infection and should not be used as the sole basis for treatment or other patient management decisions. A negative result may occur with  improper specimen collection/handling, submission of specimen other than nasopharyngeal swab, presence of viral mutation(s) within the areas targeted by this assay, and inadequate number of viral copies (<131 copies/mL). A negative result must be combined with clinical observations, patient history, and epidemiological information. The expected result is Negative. Fact Sheet for Patients:  PinkCheek.be Fact Sheet for Healthcare Providers:  GravelBags.it This test is not yet ap proved or cleared by the [place] FDA and  has been authorized for detection and/or diagnosis of SARS-CoV-2 by FDA under an Emergency Use Authorization (EUA). This EUA will remain  in effect (meaning this test can be used) for the duration of the COVID-19 declaration under Section 564(b)(1) of the Act, 21 U.S.C. section 360bbb-3(b)(1), unless the authorization is terminated or revoked sooner.    Influenza A by PCR NEGATIVE NEGATIVE Final   Influenza B by PCR NEGATIVE NEGATIVE Final    Comment: (NOTE) The Xpert Xpress SARS-CoV-2/FLU/RSV assay is intended as an aid in  the diagnosis of influenza from Nasopharyngeal swab specimens and  should not be used as a sole basis for treatment. Nasal washings and  aspirates are unacceptable for Xpert Xpress SARS-CoV-2/FLU/RSV  testing. Fact Sheet for Patients: PinkCheek.be Fact Sheet for Healthcare Providers: GravelBags.it This test is not yet approved or cleared by the [place] FDA and  has been authorized for detection and/or diagnosis of SARS-CoV-2 by  FDA under an Emergency Use Authorization (EUA). This EUA will remain  in effect (meaning this test can be used) for the duration of the  Covid-19 declaration under Section 564(b)(1) of the Act, 21  U.S.C. section 360bbb-3(b)(1), unless the authorization is  terminated or revoked. Performed at [facility], [address] [place], [place] [address]   Blood culture (single)     Status: None   Collection Time: 01/08/20  2:12 PM   Specimen: BLOOD  Result Value Ref Range Status   Specimen Description BLOOD RIGHT ANTECUBITAL  Final   Special Requests   Final    BOTTLES DRAWN AEROBIC AND ANAEROBIC Blood Culture results may not be optimal due to an excessive volume of blood received  in culture bottles   Culture   Final    NO GROWTH 5 DAYS Performed at [facility], [address] [place], [place] [address]    Report Status 01/13/2020 FINAL   Final  MRSA PCR Screening     Status: None   Collection Time: 01/08/20 10:20 PM   Specimen: Nasopharyngeal  Result Value Ref Range Status   MRSA by PCR NEGATIVE NEGATIVE Final    Comment:        The GeneXpert MRSA Assay (FDA approved for NASAL specimens only), is one component of a comprehensive MRSA colonization surveillance program. It is not intended to diagnose MRSA infection nor to guide or monitor treatment for MRSA infections. Performed at [facility], [place] [address], [place], [place] [address]   Culture, Urine     Status: Abnormal   Collection Time: 01/08/20 10:41 PM   Specimen: Urine, Catheterized  Result Value Ref Range Status   Specimen Description URINE, CATHETERIZED  Final   Special Requests   Final    NONE Performed at [facility], [place] [address], [place], [place] [address]    Culture 10,000 COLONIES/mL ESCHERICHIA COLI (A)  Final   Report Status 01/11/2020 FINAL  Final   Organism ID, Bacteria ESCHERICHIA COLI (A)  Final      Susceptibility   Escherichia coli - MIC*    AMPICILLIN 4 SENSITIVE Sensitive     CEFAZOLIN <=4 SENSITIVE Sensitive     CEFTRIAXONE <=0.25 SENSITIVE Sensitive     CIPROFLOXACIN <=0.25 SENSITIVE Sensitive     GENTAMICIN <=1 SENSITIVE Sensitive     IMIPENEM <=0.25 SENSITIVE Sensitive     NITROFURANTOIN <=16 SENSITIVE Sensitive     TRIMETH/SULFA <=20 SENSITIVE Sensitive     AMPICILLIN/SULBACTAM <=2 SENSITIVE Sensitive     PIP/TAZO <=4 SENSITIVE Sensitive     * 10,000 COLONIES/mL ESCHERICHIA COLI  Culture, blood (single)     Status: None   Collection Time: 01/08/20 11:45 PM   Specimen: BLOOD  Result Value Ref Range Status   Specimen Description BLOOD LEFT ARM  Final   Special Requests   Final    BOTTLES DRAWN AEROBIC AND ANAEROBIC Blood Culture results may not be optimal due to an excessive volume of blood received in culture bottles   Culture   Final    NO GROWTH 5 DAYS Performed at [facility], [place] [address], [place], [place] [address]    Report Status 01/14/2020 FINAL  Final     Radiology Studies: DG Ankle Complete Left  Result Date: 01/16/2020 CLINICAL DATA:  Left ankle pain/swelling EXAM: LEFT ANKLE COMPLETE - 3+ VIEW COMPARISON:  None. FINDINGS: No fracture or dislocation is seen. The ankle mortise is intact. The base of the fifth metatarsal is unremarkable. The visualized soft tissues are unremarkable. IMPRESSION: Negative. Electronically Signed   By: Julian Hy M.D.   On: 01/16/2020 15:25    Scheduled Meds: . amLODipine  5 mg Oral Daily  . Chlorhexidine Gluconate Cloth  6 each Topical Q0600  . feeding supplement (ENSURE ENLIVE)  237 mL Oral BID BM  . heparin  5,000 Units Subcutaneous Q8H  . insulin aspart  0-5 Units Subcutaneous QHS  . insulin aspart  0-9 Units Subcutaneous TID WC  . insulin glargine  5 Units Subcutaneous Daily  . mouth rinse  15 mL Mouth Rinse BID  . polyethylene glycol  17 g Oral BID  . senna  2 tablet Oral BID  . sodium  bicarbonate  1,300 mg Oral BID   Continuous Infusions: . sodium chloride Stopped (01/13/20 0735)  . sodium chloride 100 mL/hr at 01/16/20 1400     LOS: 8 days   Marylu Lund, MD [facility] Pager On Amion  If 7PM-7AM, please contact night-coverage 01/16/2020, 5:27 PM

## 2020-01-16 NOTE — Progress Notes (Signed)
[facility] NEPHROLOGY PROGRESS NOTE  Assessment/ Plan: Pt is a 74 y.o. yo female with COPD, DM, breast cancer, admitted with respiratory failure on mechanical ventilation found to have DKA, UTI and severe AKI requiring CRRT.  #Severe acute kidney injury likely due to severe dehydration, UTI and DKA: UA with possible UTI.   Kidney US with no hydronephrosis relatively small right kidney.  Severely acidemic on presentation required CRRT until 4/6. Urine output is marginal overnight however creatinine seems to be stabilized.  No uremic features to warrant dialysis today.  We will rescan her bladder to make sure that she has no urine retention.  Continue IVF and watch for renal recovery.  I will keep her HD catheter for now.     #Nephrotic range proteinuria likely diabetic nephropathy: Patient has A1c 8.5.  Needs outpatient follow-up.  #Hyperkalemia: Improved with dialysis.  #Hypertension: Blood pressure variable.  Started amlodipine.  #Metabolic acidosis: Increased sodium bicarbonate dose.    #Acute hypoxic respiratory failure: Extubated and in room air.  Subjective: Seen and examined.  Overnight urine output is recorded only 375 cc.  Patient denies nausea vomiting chest pain.  No new event. Objective Vital signs in last 24 hours: Vitals:   01/15/20 1635 01/15/20 1947 01/15/20 2345 01/16/20 0421  BP: (!) 167/75 (!) 180/67 (!) 149/49 (!) 152/57  Pulse: 72 75 75 75  Resp: 18 18 18 18   Temp: 98.1 F (36.7 C) 97.7 F (36.5 C) 98.2 F (36.8 C) 98.4 F (36.9 C)  TempSrc: Oral Oral Oral Oral  SpO2: 100% 100% 98% 98%  Weight:       Weight change:   Intake/Output Summary (Last 24 hours) at 01/16/2020 0945 Last data filed at 01/16/2020 0600 Gross per 24 hour  Intake 2127.96 ml  Output --  Net 2127.96 ml       Labs: Basic Metabolic Panel: Recent Labs  Lab 01/14/20 0257 01/15/20 0537 01/16/20 0601  NA 135 135 136  K 3.6 3.5 3.6  CL 99 102 106  CO2 20* 19* 19*   GLUCOSE 106* 101* 84  BUN 60* 62* 60*  CREATININE 5.35* 5.54* 5.65*  CALCIUM 7.5* 7.1* 7.0*  PHOS 2.8 2.8 3.6   Liver Function Tests: Recent Labs  Lab 01/14/20 0257 01/15/20 0537 01/16/20 0601  ALBUMIN 2.2* 2.1* 1.9*   No results for input(s): LIPASE, AMYLASE in the last 168 hours. No results for input(s): AMMONIA in the last 168 hours. CBC: Recent Labs  Lab 01/12/20 0600 01/12/20 0600 01/13/20 0507 01/13/20 0507 01/14/20 0257 01/15/20 0537 01/16/20 0601  WBC 6.5   < > 6.6   < > 10.0 8.9 4.5  NEUTROABS 5.0   < > 5.0   < > 8.3* 6.9 3.1  HGB 9.3*   < > 9.1*   < > 8.9* 9.7* 8.3*  HCT 28.6*   < > 28.0*   < > 27.1* 29.6* 26.2*  MCV 88.8  --  87.8  --  86.9 88.4 89.1  PLT 90*   < > 111*   < > 131* 136* 147*   < > = values in this interval not displayed.   Cardiac Enzymes: No results for input(s): CKTOTAL, CKMB, CKMBINDEX, TROPONINI in the last 168 hours. CBG: Recent Labs  Lab 01/15/20 0629 01/15/20 1143 01/15/20 1633 01/15/20 2219 01/16/20 0618  GLUCAP 86 139* 142* 154* 87    Iron Studies:  No results for input(s): IRON, TIBC, TRANSFERRIN, FERRITIN in the last 72 hours. Studies/Results: No results  found.  Medications: Infusions: . sodium chloride Stopped (01/13/20 0735)  . sodium chloride 100 mL/hr at 01/15/20 1625    Scheduled Medications: . amLODipine  5 mg Oral Daily  . Chlorhexidine Gluconate Cloth  6 each Topical Q0600  . feeding supplement (ENSURE ENLIVE)  237 mL Oral BID BM  . heparin  5,000 Units Subcutaneous Q8H  . insulin aspart  0-5 Units Subcutaneous QHS  . insulin aspart  0-9 Units Subcutaneous TID WC  . insulin glargine  5 Units Subcutaneous Daily  . mouth rinse  15 mL Mouth Rinse BID  . polyethylene glycol  17 g Oral BID  . senna  2 tablet Oral BID  . sodium bicarbonate  1,300 mg Oral BID    have reviewed scheduled and prn medications.  Physical Exam: General: Pleasant elderly looking female lying on bed Heart:RRR, s1s2 nl, no  rubs Lungs: Clear bilateral, no wheezing Abdomen:soft, Non-tender, non-distended Extremities:No LE edema Dialysis Access: Left IJ temporary HD catheter  Mirage Pfefferkorn Tanna Furry 01/16/2020,9:45 AM  LOS: 8 days  Pager: [phone]

## 2020-01-16 NOTE — Plan of Care (Signed)
  Problem: Clinical Measurements: Goal: Respiratory complications will improve Outcome: Progressing   Problem: Elimination: Goal: Will not experience complications related to bowel motility Outcome: Adequate for Discharge

## 2020-01-17 ENCOUNTER — Encounter (HOSPITAL_COMMUNITY): Payer: Self-pay | Admitting: Critical Care Medicine

## 2020-01-17 ENCOUNTER — Other Ambulatory Visit: Payer: Self-pay | Admitting: Family Medicine

## 2020-01-17 DIAGNOSIS — N179 Acute kidney failure, unspecified: Secondary | ICD-10-CM | POA: Diagnosis not present

## 2020-01-17 DIAGNOSIS — Z452 Encounter for adjustment and management of vascular access device: Secondary | ICD-10-CM | POA: Diagnosis not present

## 2020-01-17 LAB — CBC WITH DIFFERENTIAL/PLATELET
Abs Immature Granulocytes: 0.1 10*3/uL — ABNORMAL HIGH (ref 0.00–0.07)
Basophils Absolute: 0 10*3/uL (ref 0.0–0.1)
Basophils Relative: 0 %
Eosinophils Absolute: 0.1 10*3/uL (ref 0.0–0.5)
Eosinophils Relative: 1 %
HCT: 30.9 % — ABNORMAL LOW (ref 36.0–46.0)
Hemoglobin: 9.6 g/dL — ABNORMAL LOW (ref 12.0–15.0)
Immature Granulocytes: 1 %
Lymphocytes Relative: 8 %
Lymphs Abs: 0.7 10*3/uL (ref 0.7–4.0)
MCH: 28.4 pg (ref 26.0–34.0)
MCHC: 31.1 g/dL (ref 30.0–36.0)
MCV: 91.4 fL (ref 80.0–100.0)
Monocytes Absolute: 0.6 10*3/uL (ref 0.1–1.0)
Monocytes Relative: 6 %
Neutro Abs: 7.7 10*3/uL (ref 1.7–7.7)
Neutrophils Relative %: 84 %
Platelets: 170 10*3/uL (ref 150–400)
RBC: 3.38 MIL/uL — ABNORMAL LOW (ref 3.87–5.11)
RDW: 14.9 % (ref 11.5–15.5)
WBC: 9.1 10*3/uL (ref 4.0–10.5)
nRBC: 0 % (ref 0.0–0.2)

## 2020-01-17 LAB — GLUCOSE, CAPILLARY
Glucose-Capillary: 90 mg/dL (ref 70–99)
Glucose-Capillary: 77 mg/dL (ref 70–99)
Glucose-Capillary: 190 mg/dL — ABNORMAL HIGH (ref 70–99)

## 2020-01-17 LAB — RENAL FUNCTION PANEL
Albumin: 2.2 g/dL — ABNORMAL LOW (ref 3.5–5.0)
Anion gap: 14 (ref 5–15)
BUN: 61 mg/dL — ABNORMAL HIGH (ref 8–23)
CO2: 16 mmol/L — ABNORMAL LOW (ref 22–32)
Calcium: 7 mg/dL — ABNORMAL LOW (ref 8.9–10.3)
Chloride: 105 mmol/L (ref 98–111)
Creatinine, Ser: 5.67 mg/dL — ABNORMAL HIGH (ref 0.44–1.00)
GFR calc Af Amer: 8 mL/min — ABNORMAL LOW (ref >60)
GFR calc non Af Amer: 7 mL/min — ABNORMAL LOW (ref >60)
Glucose, Bld: 81 mg/dL (ref 70–99)
Phosphorus: 3.9 mg/dL (ref 2.5–4.6)
Potassium: 3.8 mmol/L (ref 3.5–5.1)
Sodium: 135 mmol/L (ref 135–145)

## 2020-01-17 MED ORDER — STERILE WATER FOR INJECTION IV SOLN
INTRAVENOUS | Status: DC
  Filled 2020-01-17 (×3): qty 850

## 2020-01-17 NOTE — Progress Notes (Signed)
PROGRESS NOTE    Cindy Burns  JKD:[id number] DOB: 27-Jul-1946 DOA: 01/08/2020 PCP: [name], MD    Brief Narrative:  74yo with hx of breast cancer, COPD, MDD, and DM presented to [place] with sob, found to be in ARF with high anion gap acidosis. Transferred to [facility] for further work up.  Assessment & Plan:   Active Problems:   Renal failure   AKI (acute kidney injury) ([place])   Pressure injury of skin   Encounter for central line placement   Pain and swelling of ankle, left  Acidosis secondary toDKA and renal failure -glucose trends remain stable and seems controlled -Cr seems to be near plateau with improvement in renal function anticipated -Per Nephro, cont HD cath for now and cont IVF -Continue on sodium bicarb as tolerated  Acute hypoxemic respiratory failure -Did require intubation initially, since extubated -Currently remains on room air. Resolved  ARF -Cr of 15.43 as of 4/3 -Nephrology following, pt is s/p CRRT in ICU -Cr nearing plateau -HD on hold per nephrology, plan to cont HD cath for another day -Concerns for dehydration with IVF continued per Nephrology -recheck bmet in AM  Normocytic anemia -Remains hemodynamically stable at this time -Hgb slightly lower at 8.3. No evidence of acute blood loss -Cont to follow CBC trends  UTI -Currently on day 6 of rocephin, planning to complete 7 day course -Pan-sensitive ecoli noted on urine culture. Blood cx reviewed and noted to be neg  COPD -currently on minimal O2 support -Continued on duoneb as needed  HTN -BP stable at this time -Cont on norvasc and PRN labetalol as tolerated  DM -Glucose overall stable -Now on lantus 5 units as was recommended by Diabetic Coordinator earlier -Cont SSI coverage -Glucose trends stable  L ankle swelling and decreased ROM -have ordered and reviewed ankle xray. Normal study -Cont with PT/OT  DVT prophylaxis: Heparin subq Code Status: Full Family Communication:  Pt in room, family not at bedside Status is: Inpatient  Remains inpatient appropriate because:Persistent severe electrolyte disturbances   Dispo: The patient is from: Home              Anticipated d/c is to: SNF              Anticipated d/c date is: > 3 days              Patient currently is not medically stable to d/c.         Consultants:   PCCM  Nephrology  Procedures:   CRRT  Antimicrobials: Anti-infectives (From admission, onward)   Start     Dose/Rate Route Frequency Ordered Stop   01/08/20 2315  cefTRIAXone (ROCEPHIN) 1 g in sodium chloride 0.9 % 100 mL IVPB     1 g 200 mL/hr over 30 Minutes Intravenous Every 24 hours 01/08/20 2224 01/14/20 2313      Subjective: Feeling tired this AM. Difficulty sleeping last night  Objective: Vitals:   01/17/20 0338 01/17/20 0755 01/17/20 1130 01/17/20 1748  BP: (!) 174/66 (!) 166/69 (!) 166/69 140/69  Pulse: 84 88 88 83  Resp: 20 18 18 19   Temp: 98.9 F (37.2 C) 98.8 F (37.1 C) 98.8 F (37.1 C)   TempSrc: Oral Oral Oral   SpO2: 97% 98%  99%  Weight:   55.8 kg   Height:   5\' 2"  (1.575 m)     Intake/Output Summary (Last 24 hours) at 01/17/2020 1827 Last data filed at 01/17/2020 1617 Gross per  24 hour  Intake 6027.81 ml  Output 1150 ml  Net 4877.81 ml   Filed Weights   01/14/20 2122 01/16/20 2005 01/17/20 1130  Weight: 55.8 kg 55.8 kg 55.8 kg    Examination: General exam: Awake, laying in bed, in nad Respiratory system: Normal respiratory effort, no wheezing Cardiovascular system: regular rate, s1, s2 Gastrointestinal system: Soft, nondistended, positive BS Central nervous system: CN2-12 grossly intact, strength intact Extremities: Perfused, no clubbing Skin: Normal skin turgor, no notable skin lesions seen Psychiatry: Mood normal // no visual hallucinations   Data Reviewed: I have personally reviewed following labs and imaging studies  CBC: Recent Labs  Lab 01/13/20 0507 01/14/20 0257  01/15/20 0537 01/16/20 0601 01/17/20 0615  WBC 6.6 10.0 8.9 4.5 9.1  NEUTROABS 5.0 8.3* 6.9 3.1 7.7  HGB 9.1* 8.9* 9.7* 8.3* 9.6*  HCT 28.0* 27.1* 29.6* 26.2* 30.9*  MCV 87.8 86.9 88.4 89.1 91.4  PLT 111* 131* 136* 147* 656   Basic Metabolic Panel: Recent Labs  Lab 01/11/20 0527 01/11/20 0527 01/11/20 1645 01/12/20 0600 01/13/20 0507 01/14/20 0257 01/15/20 0537 01/16/20 0601 01/17/20 0615  NA 136   < > 135   < > 134* 135 135 136 135  K 3.9   < > 4.0   < > 3.8 3.6 3.5 3.6 3.8  CL 101   < > 101   < > 97* 99 102 106 105  CO2 25   < > 24   < > 21* 20* 19* 19* 16*  GLUCOSE 121*   < > 96   < > 110* 106* 101* 84 81  BUN 21   < > 24*   < > 48* 60* 62* 60* 61*  CREATININE 1.72*   < > 2.39*   < > 5.06* 5.35* 5.54* 5.65* 5.67*  CALCIUM 7.3*   < > 7.3*   < > 7.4* 7.5* 7.1* 7.0* 7.0*  MG 2.2  --  2.3  --   --   --   --   --   --   PHOS 1.9*  1.8*   < > 2.3*  2.4*   < > 3.5 2.8 2.8 3.6 3.9   < > = values in this interval not displayed.   GFR: Estimated Creatinine Clearance: 7.2 mL/min (A) (by C-G formula based on SCr of 5.67 mg/dL (H)). Liver Function Tests: Recent Labs  Lab 01/13/20 0507 01/14/20 0257 01/15/20 0537 01/16/20 0601 01/17/20 0615  ALBUMIN 2.1* 2.2* 2.1* 1.9* 2.2*   No results for input(s): LIPASE, AMYLASE in the last 168 hours. No results for input(s): AMMONIA in the last 168 hours. Coagulation Profile: No results for input(s): INR, PROTIME in the last 168 hours. Cardiac Enzymes: No results for input(s): CKTOTAL, CKMB, CKMBINDEX, TROPONINI in the last 168 hours. BNP (last 3 results) No results for input(s): PROBNP in the last 8760 hours. HbA1C: No results for input(s): HGBA1C in the last 72 hours. CBG: Recent Labs  Lab 01/16/20 1150 01/16/20 1630 01/16/20 2001 01/17/20 0643 01/17/20 1116  GLUCAP 113* 90 159* 77 190*   Lipid Profile: No results for input(s): CHOL, HDL, LDLCALC, TRIG, CHOLHDL, LDLDIRECT in the last 72 hours. Thyroid Function  Tests: No results for input(s): TSH, T4TOTAL, FREET4, T3FREE, THYROIDAB in the last 72 hours. Anemia Panel: No results for input(s): VITAMINB12, FOLATE, FERRITIN, TIBC, IRON, RETICCTPCT in the last 72 hours. Sepsis Labs: No results for input(s): PROCALCITON, LATICACIDVEN in the last 168 hours.  Recent Results (from the past 240  hour(s))  Respiratory Panel by RT PCR (Flu A&B, Covid) - Nasopharyngeal Swab     Status: None   Collection Time: 01/08/20  2:12 PM   Specimen: Nasopharyngeal Swab  Result Value Ref Range Status   SARS Coronavirus 2 by RT PCR NEGATIVE NEGATIVE Final    Comment: (NOTE) SARS-CoV-2 target nucleic acids are NOT DETECTED. The SARS-CoV-2 RNA is generally detectable in upper respiratoy specimens during the acute phase of infection. The lowest concentration of SARS-CoV-2 viral copies this assay can detect is 131 copies/mL. A negative result does not preclude SARS-Cov-2 infection and should not be used as the sole basis for treatment or other patient management decisions. A negative result may occur with  improper specimen collection/handling, submission of specimen other than nasopharyngeal swab, presence of viral mutation(s) within the areas targeted by this assay, and inadequate number of viral copies (<131 copies/mL). A negative result must be combined with clinical observations, patient history, and epidemiological information. The expected result is Negative. Fact Sheet for Patients:  PinkCheek.be Fact Sheet for Healthcare Providers:  GravelBags.it This test is not yet ap proved or cleared by the [place] FDA and  has been authorized for detection and/or diagnosis of SARS-CoV-2 by FDA under an Emergency Use Authorization (EUA). This EUA will remain  in effect (meaning this test can be used) for the duration of the COVID-19 declaration under Section 564(b)(1) of the Act, 21 U.S.C. section  360bbb-3(b)(1), unless the authorization is terminated or revoked sooner.    Influenza A by PCR NEGATIVE NEGATIVE Final   Influenza B by PCR NEGATIVE NEGATIVE Final    Comment: (NOTE) The Xpert Xpress SARS-CoV-2/FLU/RSV assay is intended as an aid in  the diagnosis of influenza from Nasopharyngeal swab specimens and  should not be used as a sole basis for treatment. Nasal washings and  aspirates are unacceptable for Xpert Xpress SARS-CoV-2/FLU/RSV  testing. Fact Sheet for Patients: PinkCheek.be Fact Sheet for Healthcare Providers: GravelBags.it This test is not yet approved or cleared by the [place] FDA and  has been authorized for detection and/or diagnosis of SARS-CoV-2 by  FDA under an Emergency Use Authorization (EUA). This EUA will remain  in effect (meaning this test can be used) for the duration of the  Covid-19 declaration under Section 564(b)(1) of the Act, 21  U.S.C. section 360bbb-3(b)(1), unless the authorization is  terminated or revoked. Performed at [facility], [address] [place], [place] [address]   Blood culture (single)     Status: None   Collection Time: 01/08/20  2:12 PM   Specimen: BLOOD  Result Value Ref Range Status   Specimen Description BLOOD RIGHT ANTECUBITAL  Final   Special Requests   Final    BOTTLES DRAWN AEROBIC AND ANAEROBIC Blood Culture results may not be optimal due to an excessive volume of blood received in culture bottles   Culture   Final    NO GROWTH 5 DAYS Performed at [facility], [address] [place], [place] [address]    Report Status 01/13/2020 FINAL  Final  MRSA PCR Screening     Status: None   Collection Time: 01/08/20 10:20 PM   Specimen: Nasopharyngeal  Result Value Ref Range Status   MRSA by PCR NEGATIVE NEGATIVE Final    Comment:        The GeneXpert MRSA Assay (FDA approved for NASAL specimens only), is one component of  a comprehensive MRSA colonization surveillance program. It is not intended to diagnose MRSA infection  nor to guide or monitor treatment for MRSA infections. Performed at [facility], [place] [address] [place], [place] [address]   Culture, Urine     Status: Abnormal   Collection Time: 01/08/20 10:41 PM   Specimen: Urine, Catheterized  Result Value Ref Range Status   Specimen Description URINE, CATHETERIZED  Final   Special Requests   Final    NONE Performed at [facility], [place] [address], [place], [place] [address]    Culture 10,000 COLONIES/mL ESCHERICHIA COLI (A)  Final   Report Status 01/11/2020 FINAL  Final   Organism ID, Bacteria ESCHERICHIA COLI (A)  Final      Susceptibility   Escherichia coli - MIC*    AMPICILLIN 4 SENSITIVE Sensitive     CEFAZOLIN <=4 SENSITIVE Sensitive     CEFTRIAXONE <=0.25 SENSITIVE Sensitive     CIPROFLOXACIN <=0.25 SENSITIVE Sensitive     GENTAMICIN <=1 SENSITIVE Sensitive     IMIPENEM <=0.25 SENSITIVE Sensitive     NITROFURANTOIN <=16 SENSITIVE Sensitive     TRIMETH/SULFA <=20 SENSITIVE Sensitive     AMPICILLIN/SULBACTAM <=2 SENSITIVE Sensitive     PIP/TAZO <=4 SENSITIVE Sensitive     * 10,000 COLONIES/mL ESCHERICHIA COLI  Culture, blood (single)     Status: None   Collection Time: 01/08/20 11:45 PM   Specimen: BLOOD  Result Value Ref Range Status   Specimen Description BLOOD LEFT ARM  Final   Special Requests   Final    BOTTLES DRAWN AEROBIC AND ANAEROBIC Blood Culture results may not be optimal due to an excessive volume of blood received in culture bottles   Culture   Final    NO GROWTH 5 DAYS Performed at [facility], [place] [address], [place], [place] [address]    Report Status 01/14/2020 FINAL  Final     Radiology Studies: DG Ankle Complete Left  Result Date: 01/16/2020 CLINICAL DATA:  Left ankle pain/swelling EXAM: LEFT ANKLE COMPLETE - 3+ VIEW COMPARISON:  None. FINDINGS: No fracture or dislocation is seen.  The ankle mortise is intact. The base of the fifth metatarsal is unremarkable. The visualized soft tissues are unremarkable. IMPRESSION: Negative. Electronically Signed   By: Julian Hy M.D.   On: 01/16/2020 15:25    Scheduled Meds: . amLODipine  5 mg Oral Daily  . Chlorhexidine Gluconate Cloth  6 each Topical Q0600  . feeding supplement (ENSURE ENLIVE)  237 mL Oral BID BM  . heparin  5,000 Units Subcutaneous Q8H  . insulin aspart  0-5 Units Subcutaneous QHS  . insulin aspart  0-9 Units Subcutaneous TID WC  . insulin glargine  5 Units Subcutaneous Daily  . mouth rinse  15 mL Mouth Rinse BID  . polyethylene glycol  17 g Oral BID  . senna  2 tablet Oral BID  . sodium bicarbonate  1,300 mg Oral BID   Continuous Infusions: . sodium chloride Stopped (01/13/20 0735)  .  sodium bicarbonate (isotonic) infusion in sterile water 75 mL/hr at 01/17/20 1421     LOS: 9 days   Marylu Lund, MD [facility] Pager On Amion  If 7PM-7AM, please contact night-coverage 01/17/2020, 6:27 PM

## 2020-01-17 NOTE — Progress Notes (Signed)
Physical Therapy Treatment Patient Details Name: Cindy Burns MRN: [id number] DOB: 15-Jan-1946 Today's Date: 01/17/2020    History of Present Illness Pt is a 74 year old woman admitted on 01/08/20 to [facility] and subsequently transferred to [facility] with respiratory failure requring intubation, DKA, UTI, hyperkalemia and renal failure requiring CRRT. Extubated 01/11/20. PMH: COPD, breast cancer, Dm, MDD.    PT Comments    Continuing work on functional mobility and activity tolerance;  Able to walk in the hallway with RW and min/moderate assist; notably decr dorsiflexion L foot leading to decr foot clearance, incr fall risk, decr heel strike; Weak L ankle dorsiflexion: MMT 2/5, able to activate Tibialis Anterior, and actively move approx halfway through available ROM; Able to passively dorsiflex to just past neutral;   Recommend getting an Orthotist consult for orthotic for dorsiflexion assist.  Follow Up Recommendations  SNF;Supervision/Assistance - 24 hour     Equipment Recommendations  Rolling walker with 5" wheels;3in1 (PT)    Recommendations for Other Services Other (comment)(Orthotist for L dorsiflexion assist)     Precautions / Restrictions Precautions Precautions: Fall    Mobility  Bed Mobility Overal bed mobility: Needs Assistance Bed Mobility: Supine to Sit     Supine to sit: Min assist     General bed mobility comments: Cues for technqiue; min handheld assist to pull to sit  Transfers Overall transfer level: Needs assistance Equipment used: Rolling walker (2 wheeled) Transfers: Sit to/from Stand Sit to Stand: Min assist         General transfer comment: min stability assist; cues for hand placement and safety  Ambulation/Gait Ambulation/Gait assistance: Min assist;Mod assist Gait Distance (Feet): 90 Feet Assistive device: Rolling walker (2 wheeled) Gait Pattern/deviations: Step-through pattern;Scissoring;Narrow base of support;Decreased dorsiflexion -  left;Steppage     General Gait Details: Notable L foot decr dorsiflexion, with occasional toe catch leading to loss of balance; able to clear foot during swing with more focus and cues; incontinent of stool again today, and less stability and slightly rushed walking back to her room   Stairs             Wheelchair Mobility    Modified Rankin (Stroke Patients Only)       Balance     Sitting balance-Leahy Scale: Fair       Standing balance-Leahy Scale: Poor Standing balance comment: pt reliant on external support.                            Cognition Arousal/Alertness: Awake/alert Behavior During Therapy: WFL for tasks assessed/performed Overall Cognitive Status: Impaired/Different from baseline                         Following Commands: Follows one step commands consistently Safety/Judgement: Decreased awareness of safety;Decreased awareness of deficits Awareness: Emergent          Exercises      General Comments General comments (skin integrity, edema, etc.): VSS      Pertinent Vitals/Pain Pain Assessment: Faces Faces Pain Scale: No hurt Pain Intervention(s): Monitored during session    Home Living                      Prior Function            PT Goals (current goals can now be found in the care plan section) Acute Rehab PT Goals Patient Stated Goal: agreeable to amb  PT Goal Formulation: With patient Time For Goal Achievement: 01/26/20 Potential to Achieve Goals: Fair Progress towards PT goals: Progressing toward goals    Frequency    Min 3X/week      PT Plan Current plan remains appropriate    Co-evaluation              AM-PAC PT "6 Clicks" Mobility   Outcome Measure  Help needed turning from your back to your side while in a flat bed without using bedrails?: A Little Help needed moving from lying on your back to sitting on the side of a flat bed without using bedrails?: A Little Help needed  moving to and from a bed to a chair (including a wheelchair)?: A Little Help needed standing up from a chair using your arms (e.g., wheelchair or bedside chair)?: A Little Help needed to walk in hospital room?: A Lot Help needed climbing 3-5 steps with a railing? : Total 6 Click Score: 15    End of Session Equipment Utilized During Treatment: Gait belt Activity Tolerance: Patient tolerated treatment well;Patient limited by fatigue Patient left: in chair;with call bell/phone within reach;with chair alarm set Nurse Communication: Mobility status PT Visit Diagnosis: Other abnormalities of gait and mobility (R26.89);Unsteadiness on feet (R26.81);Muscle weakness (generalized) (M62.81)     Time: 6754-4920 PT Time Calculation (min) (ACUTE ONLY): 35 min  Charges:  $Gait Training: 8-22 mins $Therapeutic Activity: 8-22 mins                     Roney Marion, PT  Acute Rehabilitation Services Pager [phone] Office [place] 01/17/2020, 3:38 PM

## 2020-01-17 NOTE — Progress Notes (Addendum)
Patient ID: Cindy Burns, female   DOB: 06-14-1946, 74 y.o.   MRN: [id number] [facility] Progress Note   Assessment/ Plan:   1. Acute kidney Injury (baseline creatinine 0.9 in October, 2020): Suspected hemodynamically mediated acute kidney injury with severe volume depletion in the setting of UTI/DKA with likely evolution to ATN.  With significant metabolic acidosis on admission for which he underwent CRRT until 4/6 and thereafter ongoing monitoring for renal recovery without additional need for dialysis.  Creatinine at this time appears to have plateaued at around 5.6 and anticipate imminent renal recovery.   2. Anemia: Iron stores adequate and without overt losses.  We will continue to follow in the setting of acute kidney injury. 3. Hypertension: Blood pressures intermittently elevated, will continue to follow on amlodipine to decide on additional antihypertensive therapy. 4.  Metabolic acidosis: Increase oral sodium bicarbonate dose and switch intravenous fluids to isotonic bicarbonate. 5.  Diabetic ketoacidosis: Resolved, ongoing glycemic management per primary service. 6.  Left ankle swelling/discomfort: Status post radiological imaging without osseous lesion.  Subjective:   Reports to be having some discomfort in her lower extremities "which started after hospitalization".  Denies any chest pain or shortness of breath.  Denies any nausea, vomiting or dysgeusia.   Objective:   BP (!) 166/69   Pulse 88   Temp 98.8 F (37.1 C) (Oral)   Resp 18   Ht 5\' 2"  (1.575 m)   Wt 55.8 kg   SpO2 98%   BMI 22.50 kg/m   Intake/Output Summary (Last 24 hours) at 01/17/2020 1200 Last data filed at 01/17/2020 1000 Gross per 24 hour  Intake 5650.15 ml  Output 1850 ml  Net 3800.15 ml   Weight change:   Physical Exam: Gen: Comfortably sitting up in recliner, eating lunch CVS: Pulse regular rhythm, normal rate, S1 and S2 normal.  Left IJ temporary dialysis catheter. Resp: Clear to  auscultation, no rales/rhonchi Abd: Soft, flat, nontender Ext: No lower extremity edema.  Some tenderness to palpation.  Imaging: DG Ankle Complete Left  Result Date: 01/16/2020 CLINICAL DATA:  Left ankle pain/swelling EXAM: LEFT ANKLE COMPLETE - 3+ VIEW COMPARISON:  None. FINDINGS: No fracture or dislocation is seen. The ankle mortise is intact. The base of the fifth metatarsal is unremarkable. The visualized soft tissues are unremarkable. IMPRESSION: Negative. Electronically Signed   By: Julian Hy M.D.   On: 01/16/2020 15:25    Labs: BMET Recent Labs  Lab 01/12/20 0600 01/12/20 1601 01/13/20 0507 01/14/20 0257 01/15/20 0537 01/16/20 0601 01/17/20 0615  NA 139 134* 134* 135 135 136 135  K 3.9 4.2 3.8 3.6 3.5 3.6 3.8  CL 104 99 97* 99 102 106 105  CO2 19* 21* 21* 20* 19* 19* 16*  GLUCOSE 79 80 110* 106* 101* 84 81  BUN 36* 40* 48* 60* 62* 60* 61*  CREATININE 3.64* 4.07* 5.06* 5.35* 5.54* 5.65* 5.67*  CALCIUM 7.4* 7.5* 7.4* 7.5* 7.1* 7.0* 7.0*  PHOS 2.8 3.0 3.5 2.8 2.8 3.6 3.9   CBC Recent Labs  Lab 01/14/20 0257 01/15/20 0537 01/16/20 0601 01/17/20 0615  WBC 10.0 8.9 4.5 9.1  NEUTROABS 8.3* 6.9 3.1 7.7  HGB 8.9* 9.7* 8.3* 9.6*  HCT 27.1* 29.6* 26.2* 30.9*  MCV 86.9 88.4 89.1 91.4  PLT 131* 136* 147* 170   Medications:    . amLODipine  5 mg Oral Daily  . Chlorhexidine Gluconate Cloth  6 each Topical Q0600  . feeding supplement (ENSURE ENLIVE)  237 mL  Oral BID BM  . heparin  5,000 Units Subcutaneous Q8H  . insulin aspart  0-5 Units Subcutaneous QHS  . insulin aspart  0-9 Units Subcutaneous TID WC  . insulin glargine  5 Units Subcutaneous Daily  . mouth rinse  15 mL Mouth Rinse BID  . polyethylene glycol  17 g Oral BID  . senna  2 tablet Oral BID  . sodium bicarbonate  1,300 mg Oral BID   Elmarie Shiley, MD 01/17/2020, 12:00 PM

## 2020-01-18 DIAGNOSIS — Z452 Encounter for adjustment and management of vascular access device: Secondary | ICD-10-CM | POA: Diagnosis not present

## 2020-01-18 DIAGNOSIS — N179 Acute kidney failure, unspecified: Secondary | ICD-10-CM | POA: Diagnosis not present

## 2020-01-18 LAB — CBC WITH DIFFERENTIAL/PLATELET
Abs Immature Granulocytes: 0.06 10*3/uL (ref 0.00–0.07)
Basophils Absolute: 0 10*3/uL (ref 0.0–0.1)
Basophils Relative: 0 %
Eosinophils Absolute: 0.1 10*3/uL (ref 0.0–0.5)
Eosinophils Relative: 2 %
HCT: 23.5 % — ABNORMAL LOW (ref 36.0–46.0)
Hemoglobin: 7.5 g/dL — ABNORMAL LOW (ref 12.0–15.0)
Immature Granulocytes: 1 %
Lymphocytes Relative: 18 %
Lymphs Abs: 1 10*3/uL (ref 0.7–4.0)
MCH: 28.8 pg (ref 26.0–34.0)
MCHC: 31.9 g/dL (ref 30.0–36.0)
MCV: 90.4 fL (ref 80.0–100.0)
Monocytes Absolute: 0.4 10*3/uL (ref 0.1–1.0)
Monocytes Relative: 7 %
Neutro Abs: 4 10*3/uL (ref 1.7–7.7)
Neutrophils Relative %: 72 %
Platelets: 152 10*3/uL (ref 150–400)
RBC: 2.6 MIL/uL — ABNORMAL LOW (ref 3.87–5.11)
RDW: 15.2 % (ref 11.5–15.5)
WBC: 5.5 10*3/uL (ref 4.0–10.5)
nRBC: 0 % (ref 0.0–0.2)

## 2020-01-18 LAB — GLUCOSE, CAPILLARY
Glucose-Capillary: 99 mg/dL (ref 70–99)
Glucose-Capillary: 159 mg/dL — ABNORMAL HIGH (ref 70–99)
Glucose-Capillary: 222 mg/dL — ABNORMAL HIGH (ref 70–99)
Glucose-Capillary: 187 mg/dL — ABNORMAL HIGH (ref 70–99)

## 2020-01-18 LAB — RENAL FUNCTION PANEL
Albumin: 1.7 g/dL — ABNORMAL LOW (ref 3.5–5.0)
Anion gap: 12 (ref 5–15)
BUN: 62 mg/dL — ABNORMAL HIGH (ref 8–23)
CO2: 20 mmol/L — ABNORMAL LOW (ref 22–32)
Calcium: 6.5 mg/dL — ABNORMAL LOW (ref 8.9–10.3)
Chloride: 104 mmol/L (ref 98–111)
Creatinine, Ser: 5.77 mg/dL — ABNORMAL HIGH (ref 0.44–1.00)
GFR calc Af Amer: 8 mL/min — ABNORMAL LOW (ref >60)
GFR calc non Af Amer: 7 mL/min — ABNORMAL LOW (ref >60)
Glucose, Bld: 119 mg/dL — ABNORMAL HIGH (ref 70–99)
Phosphorus: 3.9 mg/dL (ref 2.5–4.6)
Potassium: 3.1 mmol/L — ABNORMAL LOW (ref 3.5–5.1)
Sodium: 136 mmol/L (ref 135–145)

## 2020-01-18 MED ORDER — POTASSIUM CHLORIDE CRYS ER 20 MEQ PO TBCR
20.0000 meq | EXTENDED_RELEASE_TABLET | Freq: Once | ORAL | Status: AC
  Administered 2020-01-18: 20 meq via ORAL
  Filled 2020-01-18: qty 1

## 2020-01-18 MED ORDER — LACTATED RINGERS IV SOLN
INTRAVENOUS | Status: DC
  Administered 2020-01-19: 75 mL/h via INTRAVENOUS

## 2020-01-18 NOTE — Progress Notes (Signed)
Occupational Therapy Treatment Patient Details Name: Cindy Burns MRN: [id number] DOB: September 03, 1946 Today's Date: 01/18/2020    History of present illness Pt is a 74 year old woman admitted on 01/08/20 to [facility] and subsequently transferred to [facility] with respiratory failure requring intubation, DKA, UTI, hyperkalemia and renal failure requiring CRRT. Extubated 01/11/20. PMH: COPD, breast cancer, Dm, MDD.   OT comments  Pt making steady progress with OT. Incontinent in bed and pt assisting with bed mobility and pericare. Able to mobilize OOB to recliner with mod A due to balance deficits and risk for falls. Pt has improved from requiring total A with ADL to mod A with LB and Min A with UB.  Pt talking about her 4 [place] dogs at home that she misses. Will continue to follow acutely and recommend SNF for rehab.  Encourage OOB daily with nsg staff.     Follow Up Recommendations  SNF;Supervision/Assistance - 24 hour    Equipment Recommendations  Other (comment)(TBA)    Recommendations for Other Services      Precautions / Restrictions Precautions Precautions: Fall Restrictions Weight Bearing Restrictions: No       Mobility Bed Mobility Overal bed mobility: Needs Assistance Bed Mobility: Rolling;Sidelying to Sit Rolling: Min guard Sidelying to sit: Mod assist          Transfers Overall transfer level: Needs assistance Equipment used: Rolling walker (2 wheeled) Transfers: Sit to/from [facility] Sit to Stand: Min assist   Squat pivot transfers: Mod assist(posterior lean; Mod A to prevent fall. VC for sequencing)          Balance Overall balance assessment: Needs assistance   Sitting balance-Leahy Scale: Fair       Standing balance-Leahy Scale: Poor Standing balance comment: pt reliant on external support.                           ADL either performed or assessed with clinical judgement   ADL Overall ADL's : Needs assistance/impaired      Grooming: Sitting;Wash/dry face;Wash/dry hands;Brushing hair;Minimal assistance   Upper Body Bathing: Minimal assistance;Sitting   Lower Body Bathing: Moderate assistance;Bed level   Upper Body Dressing : Minimal assistance;Sitting           Toileting- Clothing Manipulation and Hygiene: Maximal assistance Toileting - Clothing Manipulation Details (indicate cue type and reason): pt incontinenet of BM; pt helped clean pericare @ bed level     Functional mobility during ADLs: Moderate assistance;Rolling walker;Cueing for safety;Cueing for sequencing       Vision       Perception     Praxis      Cognition Arousal/Alertness: Awake/alert Behavior During Therapy: WFL for tasks assessed/performed Overall Cognitive Status: Impaired/Different from baseline                   Orientation Level: Situation Current Attention Level: Sustained   Following Commands: Follows one step commands consistently Safety/Judgement: Decreased awareness of safety;Decreased awareness of deficits Awareness: Emergent Problem Solving: Slow processing General Comments: unaware of falling posteriorly        Exercises Exercises: Other exercises Other Exercises Other Exercises: Marching in chair Other Exercises: bridging in bed - able tohold x 3-5 sec with min A   Shoulder Instructions       General Comments      Pertinent Vitals/ Pain       Pain Assessment: No/denies pain  Home Living  Prior Functioning/Environment              Frequency  Min 2X/week        Progress Toward Goals  OT Goals(current goals can now be found in the care plan section)  Progress towards OT goals: Progressing toward goals  Acute Rehab OT Goals Patient Stated Goal: to get betterand go home to her puppies OT Goal Formulation: With patient Time For Goal Achievement: 01/26/20 Potential to Achieve Goals: Good ADL Goals Pt Will  Perform Eating: with supervision;with set-up;sitting Pt Will Perform Grooming: with set-up;with supervision;sitting Pt Will Perform Upper Body Bathing: with supervision;with set-up;sitting Pt Will Perform Lower Body Bathing: with min assist;sit to/from stand Pt Will Perform Upper Body Dressing: with supervision;with set-up;sitting Pt Will Transfer to Toilet: with min assist;bedside commode;ambulating Pt/caregiver will Perform Home Exercise Program: Increased strength;Both right and left upper extremity;With Supervision Additional ADL Goal #2: Pt will conistently follow 1 step commandsin 3/3 trials in non-distracting enviornment  Plan Discharge plan remains appropriate    Co-evaluation                 AM-PAC OT "6 Clicks" Daily Activity     Outcome Measure   Help from another person eating meals?: A Little Help from another person taking care of personal grooming?: A Little Help from another person toileting, which includes using toliet, bedpan, or urinal?: A Lot Help from another person bathing (including washing, rinsing, drying)?: A Lot Help from another person to put on and taking off regular upper body clothing?: A Little Help from another person to put on and taking off regular lower body clothing?: A Lot 6 Click Score: 15    End of Session Equipment Utilized During Treatment: Gait belt;Rolling walker  OT Visit Diagnosis: Muscle weakness (generalized) (M62.81);Other symptoms and signs involving cognitive function;Unsteadiness on feet (R26.81)   Activity Tolerance Patient tolerated treatment well   Patient Left in chair;with call bell/phone within reach;with chair alarm set   Nurse Communication Mobility status        Time: 4656-8127 OT Time Calculation (min): 23 min  Charges: OT General Charges $OT Visit: 1 Visit OT Treatments $Self Care/Home Management : 23-37 mins  Maurie Boettcher, OT/L   Acute OT Clinical Specialist [place] Pager  [phone] Office [phone]    [facility] 01/18/2020, 3:45 PM

## 2020-01-18 NOTE — Progress Notes (Signed)
PROGRESS NOTE    Cindy Burns  MOQ:[id number] DOB: Feb 13, 1946 DOA: 01/08/2020 PCP: [name], MD    Brief Narrative:  74yo with hx of breast cancer, COPD, MDD, and DM presented to [place] with sob, found to be in ARF with high anion gap acidosis. Transferred to [facility] for further work up.  Assessment & Plan:   Active Problems:   Renal failure   AKI (acute kidney injury) ([place])   Pressure injury of skin   Encounter for central line placement   Pain and swelling of ankle, left  Acidosis secondary toDKA and renal failure -glucose trends remain stable and seems controlled -Cr seems to be near plateau with improvement in renal function anticipated -Per Nephro, change fluids to LR -Continue on sodium bicarb as tolerated -Repeat bmet in AM  Acute hypoxemic respiratory failure -Did require intubation initially, since extubated -Currently remains on room air. Remains stable  ARF -Cr of 15.43 as of 4/3 -Nephrology following, pt is s/p CRRT in ICU -Cr nearing plateau -Concerns for dehydration with IVF continued per Nephrology, changed to LR -recheck bmet in AM  Normocytic anemia -Remains hemodynamically stable at this time -Hgb slightly lower at 8.3. No evidence of acute blood loss -Cont to follow CBC  UTI -Currently on day 6 of rocephin, planning to complete 7 day course -Pan-sensitive ecoli noted on urine culture. Blood cx reviewed, neg  COPD -currently on minimal O2 support -Continued on duoneb as pt needs  HTN -BP stable at this time -Cont on norvasc and PRN labetalol as pt tolerates  DM -Glucose overall stable -Now on lantus 5 units as was recommended by Diabetic Coordinator earlier -Continue with SSI coverage -Glucose trends stable  L ankle swelling and decreased ROM -have ordered and reviewed ankle xray. Normal study -Cont with PT/OT as tolerated  DVT prophylaxis: Heparin subq Code Status: Full Family Communication: Pt in room, family not at  bedside Status is: Inpatient  Remains inpatient appropriate because:Persistent severe electrolyte disturbances  Dispo: The patient is from: Home              Anticipated d/c is to: SNF              Anticipated d/c date is: > 3 days              Patient currently is not medically stable to d/c.  Consultants:   PCCM  Nephrology  Procedures:   CRRT  Antimicrobials: Anti-infectives (From admission, onward)   Start     Dose/Rate Route Frequency Ordered Stop   01/08/20 2315  cefTRIAXone (ROCEPHIN) 1 g in sodium chloride 0.9 % 100 mL IVPB     1 g 200 mL/hr over 30 Minutes Intravenous Every 24 hours 01/08/20 2224 01/14/20 2313      Subjective: In good spirits, without chest pain or sob  Objective: Vitals:   01/18/20 0553 01/18/20 0916 01/18/20 1329 01/18/20 1536  BP: 138/60 (!) 141/53 (!) 152/57 (!) 142/55  Pulse: 84 76 82 95  Resp: 16 16 (!) 22 (!) 22  Temp: 98.9 F (37.2 C) 98.2 F (36.8 C) 98.3 F (36.8 C) 98.5 F (36.9 C)  TempSrc: Oral Oral Oral Oral  SpO2: 95% 98% 100% 98%  Weight:      Height:        Intake/Output Summary (Last 24 hours) at 01/18/2020 1826 Last data filed at 01/18/2020 1817 Gross per 24 hour  Intake 1298.42 ml  Output 1300 ml  Net -1.58 ml  Filed Weights   01/16/20 2005 01/17/20 1130 01/17/20 2101  Weight: 55.8 kg 55.8 kg 55.7 kg    Examination: General exam: Conversant, in no acute distress Respiratory system: normal chest rise, clear, no audible wheezing Cardiovascular system: regular rhythm, s1-s2 Gastrointestinal system: Nondistended, nontender, pos BS Central nervous system: No seizures, no tremors Extremities: No cyanosis, no joint deformities Skin: No rashes, no pallor Psychiatry: Affect normal // no auditory hallucinations   Data Reviewed: I have personally reviewed following labs and imaging studies  CBC: Recent Labs  Lab 01/14/20 0257 01/15/20 0537 01/16/20 0601 01/17/20 0615 01/18/20 0412  WBC 10.0 8.9 4.5 9.1  5.5  NEUTROABS 8.3* 6.9 3.1 7.7 4.0  HGB 8.9* 9.7* 8.3* 9.6* 7.5*  HCT 27.1* 29.6* 26.2* 30.9* 23.5*  MCV 86.9 88.4 89.1 91.4 90.4  PLT 131* 136* 147* 170 086   Basic Metabolic Panel: Recent Labs  Lab 01/14/20 0257 01/15/20 0537 01/16/20 0601 01/17/20 0615 01/18/20 0412  NA 135 135 136 135 136  K 3.6 3.5 3.6 3.8 3.1*  CL 99 102 106 105 104  CO2 20* 19* 19* 16* 20*  GLUCOSE 106* 101* 84 81 119*  BUN 60* 62* 60* 61* 62*  CREATININE 5.35* 5.54* 5.65* 5.67* 5.77*  CALCIUM 7.5* 7.1* 7.0* 7.0* 6.5*  PHOS 2.8 2.8 3.6 3.9 3.9   GFR: Estimated Creatinine Clearance: 7.1 mL/min (A) (by C-G formula based on SCr of 5.77 mg/dL (H)). Liver Function Tests: Recent Labs  Lab 01/14/20 0257 01/15/20 0537 01/16/20 0601 01/17/20 0615 01/18/20 0412  ALBUMIN 2.2* 2.1* 1.9* 2.2* 1.7*   No results for input(s): LIPASE, AMYLASE in the last 168 hours. No results for input(s): AMMONIA in the last 168 hours. Coagulation Profile: No results for input(s): INR, PROTIME in the last 168 hours. Cardiac Enzymes: No results for input(s): CKTOTAL, CKMB, CKMBINDEX, TROPONINI in the last 168 hours. BNP (last 3 results) No results for input(s): PROBNP in the last 8760 hours. HbA1C: No results for input(s): HGBA1C in the last 72 hours. CBG: Recent Labs  Lab 01/17/20 0643 01/17/20 1116 01/18/20 0637 01/18/20 1107 01/18/20 1608  GLUCAP 77 190* 99 159* 222*   Lipid Profile: No results for input(s): CHOL, HDL, LDLCALC, TRIG, CHOLHDL, LDLDIRECT in the last 72 hours. Thyroid Function Tests: No results for input(s): TSH, T4TOTAL, FREET4, T3FREE, THYROIDAB in the last 72 hours. Anemia Panel: No results for input(s): VITAMINB12, FOLATE, FERRITIN, TIBC, IRON, RETICCTPCT in the last 72 hours. Sepsis Labs: No results for input(s): PROCALCITON, LATICACIDVEN in the last 168 hours.  Recent Results (from the past 240 hour(s))  MRSA PCR Screening     Status: None   Collection Time: 01/08/20 10:20 PM    Specimen: Nasopharyngeal  Result Value Ref Range Status   MRSA by PCR NEGATIVE NEGATIVE Final    Comment:        The GeneXpert MRSA Assay (FDA approved for NASAL specimens only), is one component of a comprehensive MRSA colonization surveillance program. It is not intended to diagnose MRSA infection nor to guide or monitor treatment for MRSA infections. Performed at [facility], [place] [address], [place], [place] [address]   Culture, Urine     Status: Abnormal   Collection Time: 01/08/20 10:41 PM   Specimen: Urine, Catheterized  Result Value Ref Range Status   Specimen Description URINE, CATHETERIZED  Final   Special Requests   Final    NONE Performed at [facility], [place] [address] [place], [place] [address]  Culture 10,000 COLONIES/mL ESCHERICHIA COLI (A)  Final   Report Status 01/11/2020 FINAL  Final   Organism ID, Bacteria ESCHERICHIA COLI (A)  Final      Susceptibility   Escherichia coli - MIC*    AMPICILLIN 4 SENSITIVE Sensitive     CEFAZOLIN <=4 SENSITIVE Sensitive     CEFTRIAXONE <=0.25 SENSITIVE Sensitive     CIPROFLOXACIN <=0.25 SENSITIVE Sensitive     GENTAMICIN <=1 SENSITIVE Sensitive     IMIPENEM <=0.25 SENSITIVE Sensitive     NITROFURANTOIN <=16 SENSITIVE Sensitive     TRIMETH/SULFA <=20 SENSITIVE Sensitive     AMPICILLIN/SULBACTAM <=2 SENSITIVE Sensitive     PIP/TAZO <=4 SENSITIVE Sensitive     * 10,000 COLONIES/mL ESCHERICHIA COLI  Culture, blood (single)     Status: None   Collection Time: 01/08/20 11:45 PM   Specimen: BLOOD  Result Value Ref Range Status   Specimen Description BLOOD LEFT ARM  Final   Special Requests   Final    BOTTLES DRAWN AEROBIC AND ANAEROBIC Blood Culture results may not be optimal due to an excessive volume of blood received in culture bottles   Culture   Final    NO GROWTH 5 DAYS Performed at [facility], [place] [address] [place], [place] [address]    Report Status 01/14/2020 FINAL  Final     Radiology  Studies: No results found.  Scheduled Meds: . amLODipine  5 mg Oral Daily  . Chlorhexidine Gluconate Cloth  6 each Topical Q0600  . feeding supplement (ENSURE ENLIVE)  237 mL Oral BID BM  . heparin  5,000 Units Subcutaneous Q8H  . insulin aspart  0-5 Units Subcutaneous QHS  . insulin aspart  0-9 Units Subcutaneous TID WC  . insulin glargine  5 Units Subcutaneous Daily  . mouth rinse  15 mL Mouth Rinse BID  . polyethylene glycol  17 g Oral BID  . senna  2 tablet Oral BID  . sodium bicarbonate  1,300 mg Oral BID   Continuous Infusions: . sodium chloride Stopped (01/13/20 0735)  . lactated ringers 75 mL/hr at 01/18/20 1249     LOS: 10 days   Marylu Lund, MD [facility] Pager On Amion  If 7PM-7AM, please contact night-coverage 01/18/2020, 6:26 PM

## 2020-01-18 NOTE — Progress Notes (Signed)
Hypoglycemic Event  CBG: 65 @ 22:00  Treatment: given 1can sprite, 2packs graham crackers, 2 peanut butter  Symptoms:asymptomatic  Follow-up CBG: Time:23:10  CBG Result:130  Possible Reasons for Event: unknown     Cindy Burns

## 2020-01-18 NOTE — Progress Notes (Addendum)
Patient ID: Cindy Burns, female   DOB: [birth year], 74 y.o.   MRN: [id number] [facility] Progress Note   Assessment/ Plan:   1. Acute kidney Injury (baseline creatinine 0.9 in October, 2020): Suspected hemodynamically mediated acute kidney injury with severe volume depletion in the setting of UTI/DKA with likely evolution to ATN.  With significant metabolic acidosis on admission for which he underwent CRRT until 4/6 and thereafter ongoing monitoring for renal recovery without additional need for dialysis.  Creatinine at this time appears to have plateaued at around 5.6 and anticipate imminent renal recovery.  I will give a single dose of potassium and switch fluids to LR.   2. Anemia: Iron stores adequate and without overt losses.  We will continue to follow in the setting of acute kidney injury. 3. Hypertension: Blood pressures intermittently elevated, will continue to follow on amlodipine to decide on additional antihypertensive therapy. 4.  Metabolic acidosis: Increased oral sodium bicarbonate dose and today will switch her to LR. 5.  Diabetic ketoacidosis: Resolved, ongoing glycemic management per primary service. 6.  Left ankle swelling/discomfort: Status post radiological imaging without osseous lesion.  Subjective:   Without acute events overnight, denies any chest pain or shortness of breath.  Appetite vibrant.   Objective:   BP (!) 141/53 (BP Location: Right Arm)   Pulse 76   Temp 98.2 F (36.8 C) (Oral)   Resp 16   Ht 5\' 2"  (1.575 m)   Wt 55.7 kg   SpO2 98%   BMI 22.46 kg/m   Intake/Output Summary (Last 24 hours) at 01/18/2020 1053 Last data filed at 01/18/2020 0600 Gross per 24 hour  Intake 1737.66 ml  Output 700 ml  Net 1037.66 ml   Weight change: 0.008 kg  Physical Exam: Gen: Comfortably resting in bed drinking [place] CVS: Pulse regular rhythm, normal rate, S1 and S2 normal.  Left IJ temporary dialysis catheter. Resp: Clear to auscultation, no  rales/rhonchi Abd: Soft, flat, nontender Ext: No lower extremity edema.  Some tenderness to palpation.  Imaging: DG Ankle Complete Left  Result Date: 01/16/2020 CLINICAL DATA:  Left ankle pain/swelling EXAM: LEFT ANKLE COMPLETE - 3+ VIEW COMPARISON:  None. FINDINGS: No fracture or dislocation is seen. The ankle mortise is intact. The base of the fifth metatarsal is unremarkable. The visualized soft tissues are unremarkable. IMPRESSION: Negative. Electronically Signed   By: Julian Hy M.D.   On: 01/16/2020 15:25    Labs: BMET Recent Labs  Lab 01/12/20 1601 01/13/20 0507 01/14/20 0257 01/15/20 0537 01/16/20 0601 01/17/20 0615 01/18/20 0412  NA 134* 134* 135 135 136 135 136  K 4.2 3.8 3.6 3.5 3.6 3.8 3.1*  CL 99 97* 99 102 106 105 104  CO2 21* 21* 20* 19* 19* 16* 20*  GLUCOSE 80 110* 106* 101* 84 81 119*  BUN 40* 48* 60* 62* 60* 61* 62*  CREATININE 4.07* 5.06* 5.35* 5.54* 5.65* 5.67* 5.77*  CALCIUM 7.5* 7.4* 7.5* 7.1* 7.0* 7.0* 6.5*  PHOS 3.0 3.5 2.8 2.8 3.6 3.9 3.9   CBC Recent Labs  Lab 01/15/20 0537 01/16/20 0601 01/17/20 0615 01/18/20 0412  WBC 8.9 4.5 9.1 5.5  NEUTROABS 6.9 3.1 7.7 4.0  HGB 9.7* 8.3* 9.6* 7.5*  HCT 29.6* 26.2* 30.9* 23.5*  MCV 88.4 89.1 91.4 90.4  PLT 136* 147* 170 152   Medications:    . amLODipine  5 mg Oral Daily  . Chlorhexidine Gluconate Cloth  6 each Topical Q0600  . feeding supplement (ENSURE ENLIVE)  237 mL Oral BID BM  . heparin  5,000 Units Subcutaneous Q8H  . insulin aspart  0-5 Units Subcutaneous QHS  . insulin aspart  0-9 Units Subcutaneous TID WC  . insulin glargine  5 Units Subcutaneous Daily  . mouth rinse  15 mL Mouth Rinse BID  . polyethylene glycol  17 g Oral BID  . senna  2 tablet Oral BID  . sodium bicarbonate  1,300 mg Oral BID   Elmarie Shiley, MD 01/18/2020, 10:53 AM  Patient ID: Cindy Burns, female   DOB: 03-Jun-1946, 74 y.o.   MRN: [id number] [facility] Progress Note   Assessment/ Plan:    1. Acute kidney Injury (baseline creatinine 0.9 in October, 2020): Suspected hemodynamically mediated acute kidney injury with severe volume depletion in the setting of UTI/DKA with likely evolution to ATN.  With significant metabolic acidosis on admission for which he underwent CRRT until 4/6 and thereafter ongoing monitoring for renal recovery without additional need for dialysis.  Creatinine at this time appears to have plateaued at around 5.6 and anticipate imminent renal recovery.   2. Anemia: Iron stores adequate and without overt losses.  We will continue to follow in the setting of acute kidney injury. 3. Hypertension: Blood pressures intermittently elevated, will continue to follow on amlodipine to decide on additional antihypertensive therapy. 4.  Metabolic acidosis: Increase oral sodium bicarbonate dose and switch intravenous fluids to isotonic bicarbonate. 5.  Diabetic ketoacidosis: Resolved, ongoing glycemic management per primary service. 6.  Left ankle swelling/discomfort: Status post radiological imaging without osseous lesion.  Subjective:   Reports to be having some discomfort in her lower extremities "which started after hospitalization".  Denies any chest pain or shortness of breath.  Denies any nausea, vomiting or dysgeusia.   Objective:   BP (!) 141/53 (BP Location: Right Arm)   Pulse 76   Temp 98.2 F (36.8 C) (Oral)   Resp 16   Ht 5\' 2"  (1.575 m)   Wt 55.7 kg   SpO2 98%   BMI 22.46 kg/m   Intake/Output Summary (Last 24 hours) at 01/18/2020 1054 Last data filed at 01/18/2020 0600 Gross per 24 hour  Intake 1737.66 ml  Output 700 ml  Net 1037.66 ml   Weight change: 0.008 kg  Physical Exam: Gen: Comfortably sitting up in recliner, eating lunch CVS: Pulse regular rhythm, normal rate, S1 and S2 normal.  Left IJ temporary dialysis catheter. Resp: Clear to auscultation, no rales/rhonchi Abd: Soft, flat, nontender Ext: No lower extremity edema.  Some tenderness to  palpation.  Imaging: DG Ankle Complete Left  Result Date: 01/16/2020 CLINICAL DATA:  Left ankle pain/swelling EXAM: LEFT ANKLE COMPLETE - 3+ VIEW COMPARISON:  None. FINDINGS: No fracture or dislocation is seen. The ankle mortise is intact. The base of the fifth metatarsal is unremarkable. The visualized soft tissues are unremarkable. IMPRESSION: Negative. Electronically Signed   By: Julian Hy M.D.   On: 01/16/2020 15:25    Labs: BMET Recent Labs  Lab 01/12/20 1601 01/13/20 0507 01/14/20 0257 01/15/20 0537 01/16/20 0601 01/17/20 0615 01/18/20 0412  NA 134* 134* 135 135 136 135 136  K 4.2 3.8 3.6 3.5 3.6 3.8 3.1*  CL 99 97* 99 102 106 105 104  CO2 21* 21* 20* 19* 19* 16* 20*  GLUCOSE 80 110* 106* 101* 84 81 119*  BUN 40* 48* 60* 62* 60* 61* 62*  CREATININE 4.07* 5.06* 5.35* 5.54* 5.65* 5.67* 5.77*  CALCIUM 7.5* 7.4* 7.5* 7.1* 7.0* 7.0* 6.5*  PHOS 3.0 3.5 2.8 2.8 3.6 3.9 3.9   CBC Recent Labs  Lab 01/15/20 0537 01/16/20 0601 01/17/20 0615 01/18/20 0412  WBC 8.9 4.5 9.1 5.5  NEUTROABS 6.9 3.1 7.7 4.0  HGB 9.7* 8.3* 9.6* 7.5*  HCT 29.6* 26.2* 30.9* 23.5*  MCV 88.4 89.1 91.4 90.4  PLT 136* 147* 170 152   Medications:    . amLODipine  5 mg Oral Daily  . Chlorhexidine Gluconate Cloth  6 each Topical Q0600  . feeding supplement (ENSURE ENLIVE)  237 mL Oral BID BM  . heparin  5,000 Units Subcutaneous Q8H  . insulin aspart  0-5 Units Subcutaneous QHS  . insulin aspart  0-9 Units Subcutaneous TID WC  . insulin glargine  5 Units Subcutaneous Daily  . mouth rinse  15 mL Mouth Rinse BID  . polyethylene glycol  17 g Oral BID  . senna  2 tablet Oral BID  . sodium bicarbonate  1,300 mg Oral BID   Elmarie Shiley, MD 01/18/2020, 10:54 AM  Patient ID: Cindy Burns, female   DOB: 13-Apr-1946, 74 y.o.   MRN: [id number] [facility] Progress Note   Assessment/ Plan:   1. Acute kidney Injury (baseline creatinine 0.9 in October, 2020): Nonoliguric and likely with  hemodynamically mediated acute kidney injury from severe volume depletion in the setting of UTI/DKA with likely evolution to ATN.  Underwent CRRT until 01/11/2020 following which we have been monitoring urine output and labs for possible renal recovery.  She has not had any acute indications for dialysis so far and I will leave her left IJ dialysis catheter in place until we have predictable renal recovery.   2. Anemia: Iron stores adequate and without overt losses.  We will continue to follow in the setting of acute kidney injury. 3. Hypertension: Blood pressures intermittently elevated, will continue to follow on amlodipine to decide on additional antihypertensive therapy. 4.  Metabolic acidosis: Increase oral sodium bicarbonate dose and switch intravenous fluids to isotonic bicarbonate. 5.  Diabetic ketoacidosis: Resolved, ongoing glycemic management per primary service. 6.  Left ankle swelling/discomfort: Status post radiological imaging without osseous lesion.  Subjective:   Reports to be having some discomfort in her lower extremities "which started after hospitalization".  Denies any chest pain or shortness of breath.  Denies any nausea, vomiting or dysgeusia.   Objective:   BP (!) 141/53 (BP Location: Right Arm)   Pulse 76   Temp 98.2 F (36.8 C) (Oral)   Resp 16   Ht 5\' 2"  (1.575 m)   Wt 55.7 kg   SpO2 98%   BMI 22.46 kg/m   Intake/Output Summary (Last 24 hours) at 01/18/2020 1054 Last data filed at 01/18/2020 0600 Gross per 24 hour  Intake 1737.66 ml  Output 700 ml  Net 1037.66 ml   Weight change: 0.008 kg  Physical Exam: Gen: Comfortably sitting up in recliner, eating lunch CVS: Pulse regular rhythm, normal rate, S1 and S2 normal.  Left IJ temporary dialysis catheter. Resp: Clear to auscultation, no rales/rhonchi Abd: Soft, flat, nontender Ext: No lower extremity edema.  Some tenderness to palpation.  Imaging: DG Ankle Complete Left  Result Date: 01/16/2020 CLINICAL  DATA:  Left ankle pain/swelling EXAM: LEFT ANKLE COMPLETE - 3+ VIEW COMPARISON:  None. FINDINGS: No fracture or dislocation is seen. The ankle mortise is intact. The base of the fifth metatarsal is unremarkable. The visualized soft tissues are unremarkable. IMPRESSION: Negative. Electronically Signed   By: Julian Hy M.D.   On:  01/16/2020 15:25    Labs: BMET Recent Labs  Lab 01/12/20 1601 01/13/20 0507 01/14/20 0257 01/15/20 0537 01/16/20 0601 01/17/20 0615 01/18/20 0412  NA 134* 134* 135 135 136 135 136  K 4.2 3.8 3.6 3.5 3.6 3.8 3.1*  CL 99 97* 99 102 106 105 104  CO2 21* 21* 20* 19* 19* 16* 20*  GLUCOSE 80 110* 106* 101* 84 81 119*  BUN 40* 48* 60* 62* 60* 61* 62*  CREATININE 4.07* 5.06* 5.35* 5.54* 5.65* 5.67* 5.77*  CALCIUM 7.5* 7.4* 7.5* 7.1* 7.0* 7.0* 6.5*  PHOS 3.0 3.5 2.8 2.8 3.6 3.9 3.9   CBC Recent Labs  Lab 01/15/20 0537 01/16/20 0601 01/17/20 0615 01/18/20 0412  WBC 8.9 4.5 9.1 5.5  NEUTROABS 6.9 3.1 7.7 4.0  HGB 9.7* 8.3* 9.6* 7.5*  HCT 29.6* 26.2* 30.9* 23.5*  MCV 88.4 89.1 91.4 90.4  PLT 136* 147* 170 152   Medications:    . amLODipine  5 mg Oral Daily  . Chlorhexidine Gluconate Cloth  6 each Topical Q0600  . feeding supplement (ENSURE ENLIVE)  237 mL Oral BID BM  . heparin  5,000 Units Subcutaneous Q8H  . insulin aspart  0-5 Units Subcutaneous QHS  . insulin aspart  0-9 Units Subcutaneous TID WC  . insulin glargine  5 Units Subcutaneous Daily  . mouth rinse  15 mL Mouth Rinse BID  . polyethylene glycol  17 g Oral BID  . senna  2 tablet Oral BID  . sodium bicarbonate  1,300 mg Oral BID   Elmarie Shiley, MD 01/18/2020, 10:54 AM

## 2020-01-18 NOTE — TOC Initial Note (Addendum)
Transition of Care Cindy Burns) - Initial/Assessment Note    Patient Details  Name: Cindy Burns MRN: [id number] Date of Birth: Jun 09, 1946  Transition of Care [place]indy [place]) CM/SW Contact:    Cindy Feil, LCSW Phone Number: 01/18/2020, 3:21 PM  Clinical Narrative: CSW talked with patient at the bedside regarding her discharge disposition and PT/OT recommendation of ST rehab. CSW explained what short-term rehab is and the purpose/goals of going to rehab at a skilled nursing facility. When asked, Cindy Burns indicated that she wants to go home at discharge. This began a discussion of having help at home. Cindy Burns stated that she has a friend, Cindy Burns, and when asked if he could be with her at discharge 24/7 for a few days or checking on her regularly, patient responded "I don't see why not". Cindy Burns also reported that she and Cindy Burns have been friends for 30 years and she lives in [place]    When asked, patient responded that she does not have any DME (CSW explained what DME is) at home and has never used home health services. Patient was provided with [place] list and [place] services explained. Patient also advised that CSW would look into Personal Care Services, and if eligible, these services would be paid for by her Medicaid. [name] agreeable to CSW looking into [place]indy [place] and PCS services. Patient also provided with CSW's card and asked her to give her friend Cindy Burns CSW's phone number if he calls her, and patient expressed understanding.                  Expected Discharge Plan: [facility] [place] Services(CSW will also look into PCS services for patient) Barriers to Discharge: Continued Medical Work up   Patient Goals and CMS Choice Patient states their goals for this hospitalization and ongoing recovery are:: Patient wants to get better and return home CMS Medicare.gov Compare Post Acute Care list provided to:: Other (Comment Required)(SNF list not provided as patient refusing SNF. Home  Health list will be provided to patient.) Choice offered to / list presented to : Patient(Patient will be provided with [facility] list)  Expected Discharge Plan and Services Expected Discharge Plan: [facility] [place] Services(CSW will also look into PCS services for patient) In-house Referral: Clinical Social Work Discharge Planning Services: Other - See comment(Will apply for PCS services for patient and talk with patient about [facility] services (list will be provided))   Living arrangements for the past 2 months: Cindy Burns                                      Prior Living Arrangements/Services Living arrangements for the past 2 months: Cindy Burns with:: Self Patient language and need for interpreter reviewed:: No Do you feel safe going back to the place where you live?: Yes(Patient feels safe returning home. CSW talked with her about who would be available to check on her regularly or be with her during the day)      Need for Family Participation in Patient Care: No (Comment) Care giver support system in place?: No (comment) Current home services: (No current home services) Criminal Activity/Legal Involvement Pertinent to Current Situation/Hospitalization: No - Comment as needed  Activities of Daily Living Home Assistive Devices/Equipment: Gilford Rile (specify type) ADL Screening (condition at time of admission) Patient's cognitive ability adequate to safely complete daily activities?: Yes Is the patient deaf or  have difficulty hearing?: No Does the patient have difficulty seeing, even when wearing glasses/contacts?: No Does the patient have difficulty concentrating, remembering, or making decisions?: No Patient able to express need for assistance with ADLs?: Yes Does the patient have difficulty dressing or bathing?: No Independently performs ADLs?: Yes (appropriate for developmental age) Does the patient have difficulty walking or climbing stairs?: Yes Weakness of  Legs: Both Weakness of Arms/Hands: None  Permission Sought/Granted Permission sought to share information with : Other (comment)(Patient does not have phone number for friend Cindy Burns who does check on her per patient) Permission granted to share information with : Yes, Verbal Permission Granted  Share Information with NAME: Cindy Burns(Friend Cindy Burns, if he calls her again, she will provide him with CSW's phone number)     Permission granted to share info w Relationship: Friend     Emotional Assessment Appearance:: Appears stated age Attitude/Demeanor/Rapport: Engaged Affect (typically observed): Appropriate, Pleasant Orientation: : Oriented to Self, Oriented to Place, Oriented to  Time, Oriented to Situation Alcohol / Substance Use: Tobacco Use, Alcohol Use, Illicit Drugs(Per H&P, patient currently smokes and does not drink or use illicit drugs) Psych Involvement: No (comment)  Admission diagnosis:  Renal failure [N19] Patient Active Problem List   Diagnosis Date Noted  . Encounter for central line placement   . Pain and swelling of ankle, left   . Pressure injury of skin 01/09/2020  . Renal failure 01/08/2020  . AKI (acute kidney injury) ([place])   . Hepatomegaly 07/30/2019  . Right carotid bruit 07/30/2019  . SIADH (syndrome of inappropriate ADH production) ([place]) 04/06/2019  . B12 deficiency 04/06/2019  . Balance problem 03/18/2019  . Ventral hernia 04/03/2017  . Hypertension 02/28/2017  . Chronic insomnia 10/26/2013  . Diabetic autonomic neuropathy ([place]) 08/12/2013  . PULMONARY NODULE 09/27/2009  . ADENOCARCINOMA, BREAST 02/28/2009  . Normocytic anemia 06/24/2008  . Moderate COPD (chronic obstructive pulmonary disease) ([place]) 03/14/2008  . Major depressive disorder, recurrent episode, moderate ([place]) 08/11/2007  . Diabetes mellitus due to underlying condition, controlled, with neurologic complication ([place]) 71/03/2693  . HYPERLIPIDEMIA, MIXED 04/30/2007  . CIGARETTE SMOKER  04/22/2007   PCP:  Cindy Sanders, MD Pharmacy:   [place], [place] [address], [place] [address] Phone: [phone] Fax: [phone]  [id number] [place], [address] [place] [address] Phone: [phone] Fax: [place], [place] [facility] [address] [place] [address] [place] [address] Phone: [phone] Fax: [phone]     Social Determinants of Health ([place]) Interventions  No SDOH interventions requested or needed at this time  Readmission Risk Interventions No flowsheet data found.

## 2020-01-18 NOTE — Plan of Care (Signed)
°  Problem: Clinical Measurements: °Goal: Ability to maintain clinical measurements within normal limits will improve °Outcome: Progressing °  °Problem: Clinical Measurements: °Goal: Diagnostic test results will improve °Outcome: Progressing °  °

## 2020-01-19 DIAGNOSIS — N3000 Acute cystitis without hematuria: Secondary | ICD-10-CM

## 2020-01-19 DIAGNOSIS — F17200 Nicotine dependence, unspecified, uncomplicated: Secondary | ICD-10-CM

## 2020-01-19 DIAGNOSIS — J9601 Acute respiratory failure with hypoxia: Secondary | ICD-10-CM | POA: Diagnosis present

## 2020-01-19 DIAGNOSIS — N390 Urinary tract infection, site not specified: Secondary | ICD-10-CM | POA: Diagnosis present

## 2020-01-19 DIAGNOSIS — I10 Essential (primary) hypertension: Secondary | ICD-10-CM

## 2020-01-19 DIAGNOSIS — E872 Acidosis, unspecified: Secondary | ICD-10-CM | POA: Diagnosis present

## 2020-01-19 DIAGNOSIS — N179 Acute kidney failure, unspecified: Secondary | ICD-10-CM | POA: Diagnosis not present

## 2020-01-19 LAB — CBC WITH DIFFERENTIAL/PLATELET
Abs Immature Granulocytes: 0.07 10*3/uL (ref 0.00–0.07)
Basophils Absolute: 0 10*3/uL (ref 0.0–0.1)
Basophils Relative: 0 %
Eosinophils Absolute: 0.1 10*3/uL (ref 0.0–0.5)
Eosinophils Relative: 1 %
HCT: 23.4 % — ABNORMAL LOW (ref 36.0–46.0)
Hemoglobin: 7.6 g/dL — ABNORMAL LOW (ref 12.0–15.0)
Immature Granulocytes: 1 %
Lymphocytes Relative: 13 %
Lymphs Abs: 0.9 10*3/uL (ref 0.7–4.0)
MCH: 29.1 pg (ref 26.0–34.0)
MCHC: 32.5 g/dL (ref 30.0–36.0)
MCV: 89.7 fL (ref 80.0–100.0)
Monocytes Absolute: 0.5 10*3/uL (ref 0.1–1.0)
Monocytes Relative: 7 %
Neutro Abs: 5.4 10*3/uL (ref 1.7–7.7)
Neutrophils Relative %: 78 %
Platelets: 167 10*3/uL (ref 150–400)
RBC: 2.61 MIL/uL — ABNORMAL LOW (ref 3.87–5.11)
RDW: 15.6 % — ABNORMAL HIGH (ref 11.5–15.5)
WBC: 6.9 10*3/uL (ref 4.0–10.5)
nRBC: 0 % (ref 0.0–0.2)

## 2020-01-19 LAB — RENAL FUNCTION PANEL
Albumin: 1.8 g/dL — ABNORMAL LOW (ref 3.5–5.0)
Anion gap: 12 (ref 5–15)
BUN: 66 mg/dL — ABNORMAL HIGH (ref 8–23)
CO2: 24 mmol/L (ref 22–32)
Calcium: 6.4 mg/dL — CL (ref 8.9–10.3)
Chloride: 98 mmol/L (ref 98–111)
Creatinine, Ser: 5.83 mg/dL — ABNORMAL HIGH (ref 0.44–1.00)
GFR calc Af Amer: 8 mL/min — ABNORMAL LOW (ref >60)
GFR calc non Af Amer: 7 mL/min — ABNORMAL LOW (ref >60)
Glucose, Bld: 100 mg/dL — ABNORMAL HIGH (ref 70–99)
Phosphorus: 3 mg/dL (ref 2.5–4.6)
Potassium: 3.5 mmol/L (ref 3.5–5.1)
Sodium: 134 mmol/L — ABNORMAL LOW (ref 135–145)

## 2020-01-19 LAB — GLUCOSE, CAPILLARY
Glucose-Capillary: 103 mg/dL — ABNORMAL HIGH (ref 70–99)
Glucose-Capillary: 159 mg/dL — ABNORMAL HIGH (ref 70–99)
Glucose-Capillary: 92 mg/dL (ref 70–99)
Glucose-Capillary: 169 mg/dL — ABNORMAL HIGH (ref 70–99)
Glucose-Capillary: 387 mg/dL — ABNORMAL HIGH (ref 70–99)

## 2020-01-19 MED ORDER — CALCIUM CARBONATE 1250 (500 CA) MG PO TABS
1.0000 | ORAL_TABLET | Freq: Once | ORAL | Status: AC
  Administered 2020-01-19: 500 mg via ORAL
  Filled 2020-01-19: qty 1

## 2020-01-19 MED ORDER — IPRATROPIUM-ALBUTEROL 0.5-2.5 (3) MG/3ML IN SOLN
3.0000 mL | Freq: Four times a day (QID) | RESPIRATORY_TRACT | Status: DC
  Administered 2020-01-19: 3 mL via RESPIRATORY_TRACT
  Filled 2020-01-19: qty 3

## 2020-01-19 NOTE — Progress Notes (Signed)
PT Progress Note for Charges    01/19/20 1400  PT Visit Information  Last PT Received On 01/19/20  PT General Charges  $$ ACUTE PT VISIT 1 Visit  PT Treatments  $Gait Training 8-22 mins  $Therapeutic Activity 8-22 mins  Anastasio Champion, DPT  Acute Rehabilitation Services Pager [phone] Office [phone]

## 2020-01-19 NOTE — Progress Notes (Signed)
Physical Therapy Treatment Patient Details Name: Cindy Burns MRN: [id number] DOB: 07-06-1946 Today's Date: 01/19/2020    History of Present Illness Pt is a 74 year old woman admitted on 01/08/20 to [facility] and subsequently transferred to [facility] with respiratory failure requring intubation, DKA, UTI, hyperkalemia and renal failure requiring CRRT. Extubated 01/11/20. PMH: COPD, breast cancer, Dm, MDD.    PT Comments    Patient continues to progress with functional mobility. She demonstrated continence by using the raised toilet and performing peri care independently with min guard for safety. She was able to ambulate to bathroom, sink, and chair with min guard for safety as she demonstrates mild imbalance during standing and ambulation. She continues to demonstrate dec abililty to DF her L foot stating it began during this hospital stay- emphasized knee and hip flexion to clear foot during swing. Recommend continued strength and ROM exercises to address L DF weakness, balance, and mobility. Pt would continue to benefit from skilled physical therapy services at this time while admitted and after d/c to address the below listed limitations in order to improve overall safety and independence with functional mobility.   Follow Up Recommendations  SNF;Supervision/Assistance - 24 hour     Equipment Recommendations  Rolling walker with 5" wheels;3in1 (PT)    Recommendations for Other Services       Precautions / Restrictions Precautions Precautions: Fall Restrictions Weight Bearing Restrictions: No    Mobility  Bed Mobility Overal bed mobility: Needs Assistance Bed Mobility: Supine to Sit     Supine to sit: Min guard     General bed mobility comments: min guard for safety. No physical assist required  Transfers Overall transfer level: Needs assistance Equipment used: Rolling walker (2 wheeled) Transfers: Sit to/from Stand Sit to Stand: Min guard;From elevated surface         General  transfer comment: VC for proper hand placement and sequencing with RW. no physical assist needed from EOB or raised toilet  Ambulation/Gait Ambulation/Gait assistance: Min assist Gait Distance (Feet): 25 Feet Assistive device: Rolling walker (2 wheeled) Gait Pattern/deviations: Step-through pattern;Decreased stride length;Narrow base of support Gait velocity: dec   General Gait Details: Dec L foot DF, cues to exagerate knee drive to clear foot during swing. Pt required minA for RW navigation and safety. Distance limited due to pt's request to sit in chair and call friend back   Stairs             Wheelchair Mobility    Modified Rankin (Stroke Patients Only)       Balance Overall balance assessment: Needs assistance Sitting-balance support: No upper extremity supported;Feet supported Sitting balance-Leahy Scale: Fair Sitting balance - Comments: no LOB noted   Standing balance support: Bilateral upper extremity supported;During functional activity;No upper extremity supported Standing balance-Leahy Scale: Fair Standing balance comment: Pt able to stand at sink to wash hands without support. Required B UE supoprt during mobility                            Cognition Arousal/Alertness: Awake/alert Behavior During Therapy: WFL for tasks assessed/performed Overall Cognitive Status: Impaired/Different from baseline Area of Impairment: Safety/judgement;Awareness;Problem solving                         Safety/Judgement: Decreased awareness of safety;Decreased awareness of deficits Awareness: Emergent Problem Solving: Slow processing;Difficulty sequencing General Comments: inc time to sequence and problem solve with RW  Exercises General Exercises - Lower Extremity Ankle Circles/Pumps: AROM;Strengthening;Both;5 reps;Supine Hip Flexion/Marching: AROM;Strengthening;Both;5 reps;Standing    General Comments General comments (skin integrity, edema,  etc.): HR 98 sitting EOB and 110 standing at sink.       Pertinent Vitals/Pain Pain Assessment: No/denies pain    Home Living                      Prior Function            PT Goals (current goals can now be found in the care plan section) Acute Rehab PT Goals PT Goal Formulation: With patient Time For Goal Achievement: 01/26/20 Potential to Achieve Goals: Fair Progress towards PT goals: Progressing toward goals    Frequency    Min 3X/week      PT Plan Current plan remains appropriate    Co-evaluation              AM-PAC PT "6 Clicks" Mobility   Outcome Measure  Help needed turning from your back to your side while in a flat bed without using bedrails?: A Little Help needed moving from lying on your back to sitting on the side of a flat bed without using bedrails?: None Help needed moving to and from a bed to a chair (including a wheelchair)?: A Little Help needed standing up from a chair using your arms (e.g., wheelchair or bedside chair)?: A Little Help needed to walk in hospital room?: A Little Help needed climbing 3-5 steps with a railing? : A Lot 6 Click Score: 18    End of Session Equipment Utilized During Treatment: Gait belt Activity Tolerance: Patient tolerated treatment well Patient left: in chair;with call bell/phone within reach;with chair alarm set   PT Visit Diagnosis: Other abnormalities of gait and mobility (R26.89);Unsteadiness on feet (R26.81);Muscle weakness (generalized) (M62.81)     Time: 3428-7681 PT Time Calculation (min) (ACUTE ONLY): 27 min  Charges:  $Gait Training: 8-22 mins $Therapeutic Activity: 8-22 mins                     Cindy Burns, SPT Acute Rehab  [phone]    Cindy Burns 01/19/2020, 2:16 PM

## 2020-01-19 NOTE — Progress Notes (Addendum)
Progress Note    Cindy Burns  JEH:[id number] DOB: 01/06/1946  DOA: 01/08/2020 PCP: [name], MD    Brief Narrative:   Chief complaint: generalized weakness/sob/wheeze  Medical records reviewed and are as summarized below:  Cindy Burns is an 74 y.o. female past medical history that includes breast cancer, COPD, tobacco use, diabetes, MDD who was admitted April 3 with acute renal failure with hyperkalemia, anion gap metabolic acidosis secondary to DKA, acute respiratory failure requiring intubation, acute kidney injury requiring hemodialysis.   Assessment/Plan:   Principal Problem:   Renal failure Active Problems:   Hypocalcemia   Metabolic acidosis   Acute respiratory failure with hypoxia (HCC)   CIGARETTE SMOKER   UTI (urinary tract infection)   Pressure injury of skin   Encounter for central line placement   Pain and swelling of ankle, left   #1.  Acute renal failure secondary to severe volume depletion in the setting of DKA/UTI.  Weighted by nephrology.  Chart review indicates patient had significant metabolic acidosis on admission underwent CRRT until April 6.  Renal functions gradually improved and so far no need for dialysis.  Creatinine seems stable around 5.8.  Of note yesterday IV fluids changed to lactated Ringer's per nephrology. -Management per nephrology -Monitor  #2.  Acute respiratory failure with hypoxia.  Initially patient required intubation.  Currently oxygen saturation levels greater than 90% on room air.  She has mild increased work of breathing with conversation and end expiratory wheezing.  Of note she is a former smoker. -Scheduled nebs for 3 doses -Incentive spirometry -Mobilize -Monitor oxygen saturation level -Oxygen supplementation as indicated  #3.  Metabolic acidosis secondary to DKA and renal failure.  Improving slowly.  Proved diabetes control.  Creatinine stable -Continue sodium bicarb twice daily -management per  nephrology  #4.  Hypocalcemia.  6.4.  Likely related to above. -Supplement -Recheck  #5.  Urinary tract infection.  She has received Rocephin for 7 days.  Pansensitive E. coli noted on culture.  Blood cultures negative.  She is afebrile hemodynamically stable nontoxic-appearing   #6.  Normocytic anemia.  Hemoglobin stable.  Likely related to acute kidney injury.  No sign symptoms of active bleeding. -Monitor  #7.  Diabetes type 2. .  Patient in DKA on admission.  Home medications include Januvia.  Hemoglobin A1c 8.5.  Low-dose long-term insulin started.  Improved control. -Continue with long-acting insulin -Sliding scale insulin -Monitor  #8.  Hypertension.  Fair control.  Medications include Norvasc and as needed labetalol -Monitor -Add a second agent as indicated  #9.  COPD.  Patient is a smoker.  No home oxygen.  Oxygen saturation level greater than 90% on room air. -Scheduled nebs -Incentive spirometry -Monitor oxygen saturation level  #10.  Pain and swelling of the left ankle.  X-rays were negative. -Pain management -PT and OT   Family Communication/Anticipated D/C date and plan/Code Status   DVT prophylaxis: Lovenox ordered. Code Status: Full Code.  Family Communication: Patient at bedside all questions answered Disposition Plan: Status is: Inpatient evaluated by PT who recommends short-term SNF patient declines.  She is in the process of arranging 24/7 care as she currently lives alone  Dispo: The patient is from: Home              Anticipated d/c is to: Home              Anticipated d/c date is: 2 days  Patient currently is not medically stable to d/c.   Medical Consultants:    Cindy Burns nephrology   Anti-Infectives:    Rocephin 7 days  Subjective:   Awake alert denies pain or discomfort  Objective:    Vitals:   01/18/20 2100 01/18/20 2112 01/19/20 0442 01/19/20 0825  BP:  (!) 140/53 (!) 131/42 (!) 150/57  Pulse:  86 84 83  Resp:  18  18 18   Temp:  99.2 F (37.3 C) 97.6 F (36.4 C) 97.9 F (36.6 C)  TempSrc:  Oral  Oral  SpO2:  97% 92% 95%  Weight: 61.6 kg     Height:        Intake/Output Summary (Last 24 hours) at 01/19/2020 0920 Last data filed at 01/19/2020 0800 Gross per 24 hour  Intake 2318.42 ml  Output 1280 ml  Net 1038.42 ml   Filed Weights   01/17/20 1130 01/17/20 2101 01/18/20 2100  Weight: 55.8 kg 55.7 kg 61.6 kg    Exam: General: Obese slightly pale somewhat ill-appearing in no acute distress CV: Regular rate and rhythm no murmur gallop or rub trace lower extremity edema bilaterally Respiratory: Mild increased work of breathing with conversation.  Breath sounds with fair air movement and expiratory wheezing throughout.  No crackles Abdomen: Obese soft positive bowel sounds throughout no guarding or rebounding Musculoskeletal: Joints without swelling/erythema full range of motion Neuro: Awake alert cranial nerves II through XII grossly intact  Data Reviewed:   I have personally reviewed following labs and imaging studies:  Labs: Labs show the following:   Basic Metabolic Panel: Recent Labs  Lab 01/15/20 0537 01/15/20 0537 01/16/20 0601 01/16/20 0601 01/17/20 0615 01/17/20 0615 01/18/20 0412 01/19/20 0415  NA 135  --  136  --  135  --  136 134*  K 3.5   < > 3.6   < > 3.8   < > 3.1* 3.5  CL 102  --  106  --  105  --  104 98  CO2 19*  --  19*  --  16*  --  20* 24  GLUCOSE 101*  --  84  --  81  --  119* 100*  BUN 62*  --  60*  --  61*  --  62* 66*  CREATININE 5.54*  --  5.65*  --  5.67*  --  5.77* 5.83*  CALCIUM 7.1*  --  7.0*  --  7.0*  --  6.5* 6.4*  PHOS 2.8  --  3.6  --  3.9  --  3.9 3.0   < > = values in this interval not displayed.   GFR Estimated Creatinine Clearance: 7.6 mL/min (A) (by C-G formula based on SCr of 5.83 mg/dL (H)). Liver Function Tests: Recent Labs  Lab 01/15/20 0537 01/16/20 0601 01/17/20 0615 01/18/20 0412 01/19/20 0415  ALBUMIN 2.1* 1.9* 2.2* 1.7*  1.8*   No results for input(s): LIPASE, AMYLASE in the last 168 hours. No results for input(s): AMMONIA in the last 168 hours. Coagulation profile No results for input(s): INR, PROTIME in the last 168 hours.  CBC: Recent Labs  Lab 01/15/20 0537 01/16/20 0601 01/17/20 0615 01/18/20 0412 01/19/20 0415  WBC 8.9 4.5 9.1 5.5 6.9  NEUTROABS 6.9 3.1 7.7 4.0 5.4  HGB 9.7* 8.3* 9.6* 7.5* 7.6*  HCT 29.6* 26.2* 30.9* 23.5* 23.4*  MCV 88.4 89.1 91.4 90.4 89.7  PLT 136* 147* 170 152 167   Cardiac Enzymes: No results for input(s): CKTOTAL, CKMB, CKMBINDEX, TROPONINI  in the last 168 hours. BNP (last 3 results) No results for input(s): PROBNP in the last 8760 hours. CBG: Recent Labs  Lab 01/18/20 0637 01/18/20 1107 01/18/20 1608 01/18/20 2141 01/19/20 0634  GLUCAP 99 159* 222* 187* 103*   D-Dimer: No results for input(s): DDIMER in the last 72 hours. Hgb A1c: No results for input(s): HGBA1C in the last 72 hours. Lipid Profile: No results for input(s): CHOL, HDL, LDLCALC, TRIG, CHOLHDL, LDLDIRECT in the last 72 hours. Thyroid function studies: No results for input(s): TSH, T4TOTAL, T3FREE, THYROIDAB in the last 72 hours.  Invalid input(s): FREET3 Anemia work up: No results for input(s): VITAMINB12, FOLATE, FERRITIN, TIBC, IRON, RETICCTPCT in the last 72 hours. Sepsis Labs: Recent Labs  Lab 01/16/20 0601 01/17/20 0615 01/18/20 0412 01/19/20 0415  WBC 4.5 9.1 5.5 6.9    Microbiology No results found for this or any previous visit (from the past 240 hour(s)).  Procedures and diagnostic studies:  No results found.  Medications:   . amLODipine  5 mg Oral Daily  . Chlorhexidine Gluconate Cloth  6 each Topical Q0600  . feeding supplement (ENSURE ENLIVE)  237 mL Oral BID BM  . heparin  5,000 Units Subcutaneous Q8H  . insulin aspart  0-5 Units Subcutaneous QHS  . insulin aspart  0-9 Units Subcutaneous TID WC  . insulin glargine  5 Units Subcutaneous Daily  .  ipratropium-albuterol  3 mL Nebulization Q6H  . mouth rinse  15 mL Mouth Rinse BID  . polyethylene glycol  17 g Oral BID  . senna  2 tablet Oral BID  . sodium bicarbonate  1,300 mg Oral BID   Continuous Infusions: . sodium chloride Stopped (01/13/20 0735)  . lactated ringers 75 mL/hr at 01/19/20 0219     LOS: 11 days   Radene Gunning NP [facility]   How to contact the [facility] Attending or Consulting provider [place] or covering provider during after hours [place], for this patient?  1. Check the care team in [facility] and look for a) attending/consulting [facility] provider listed and b) the [facility] team listed 2. Log into [email or web address] and use [place]'s universal password to access. If you do not have the password, please contact the hospital operator. 3. Locate the [facility] provider you are looking for under [facility] and page to a number that you can be directly reached. 4. If you still have difficulty reaching the provider, please page the [facility] (Director on Call) for the Hospitalists listed on amion for assistance.  01/19/2020, 9:20 AM

## 2020-01-19 NOTE — Progress Notes (Signed)
CRITICAL VALUE ALERT  Critical Value: Calcium 6.4 mg/dL  Date & Time Notified:  01/19/20 & 05:50  Provider Notified: textpage MD Opyd  Orders Received/Actions taken: yes

## 2020-01-19 NOTE — Progress Notes (Addendum)
Patient ID: Cindy Burns, female   DOB: [birth year], 74 y.o.   MRN: [id number] [facility] Progress Note   Assessment/ Plan:   1. Acute kidney Injury (baseline creatinine 0.9 in October, 2020): Suspected hemodynamically mediated acute kidney injury with severe volume depletion in the setting of UTI/DKA with likely evolution to ATN.  With significant metabolic acidosis on admission for which he underwent CRRT until 4/6 and thereafter ongoing monitoring for renal recovery without additional need for dialysis.  Creatinine at this time appears to have plateaued at around 5.6 and anticipate imminent renal recovery.  I will give a single dose of potassium and switch fluids to LR.   2. Anemia: Iron stores adequate and without overt losses.  We will continue to follow in the setting of acute kidney injury. 3. Hypertension: Blood pressures intermittently elevated, will continue to follow on amlodipine to decide on additional antihypertensive therapy. 4.  Metabolic acidosis: Increased oral sodium bicarbonate dose and today will switch her to LR. 5.  Diabetic ketoacidosis: Resolved, ongoing glycemic management per primary service. 6.  Left ankle swelling/discomfort: Status post radiological imaging without osseous lesion.  Subjective:   Denies any abnormal limb jerking movements, chest pain/shortness of breath, nausea, vomiting or hallucinations.   Objective:   BP (!) 150/57 (BP Location: Right Arm)   Pulse 80   Temp 97.9 F (36.6 C) (Oral)   Resp 16   Ht 5\' 2"  (1.575 m)   Wt 61.6 kg   SpO2 92%   BMI 24.84 kg/m   Intake/Output Summary (Last 24 hours) at 01/19/2020 1116 Last data filed at 01/19/2020 1029 Gross per 24 hour  Intake 2318.42 ml  Output 1480 ml  Net 838.42 ml   Weight change: 5.8 kg  Physical Exam: Gen: Comfortably resting in bed watching television CVS: Pulse regular rhythm, normal rate, S1 and S2 normal.  Left IJ temporary dialysis catheter. Resp: Clear to  auscultation, no rales/rhonchi Abd: Soft, flat, nontender Ext: No lower extremity edema.  Some tenderness to palpation.  Imaging: No results found.  Labs: BMET Recent Labs  Lab 01/13/20 0507 01/14/20 0257 01/15/20 0537 01/16/20 0601 01/17/20 0615 01/18/20 0412 01/19/20 0415  NA 134* 135 135 136 135 136 134*  K 3.8 3.6 3.5 3.6 3.8 3.1* 3.5  CL 97* 99 102 106 105 104 98  CO2 21* 20* 19* 19* 16* 20* 24  GLUCOSE 110* 106* 101* 84 81 119* 100*  BUN 48* 60* 62* 60* 61* 62* 66*  CREATININE 5.06* 5.35* 5.54* 5.65* 5.67* 5.77* 5.83*  CALCIUM 7.4* 7.5* 7.1* 7.0* 7.0* 6.5* 6.4*  PHOS 3.5 2.8 2.8 3.6 3.9 3.9 3.0   CBC Recent Labs  Lab 01/16/20 0601 01/17/20 0615 01/18/20 0412 01/19/20 0415  WBC 4.5 9.1 5.5 6.9  NEUTROABS 3.1 7.7 4.0 5.4  HGB 8.3* 9.6* 7.5* 7.6*  HCT 26.2* 30.9* 23.5* 23.4*  MCV 89.1 91.4 90.4 89.7  PLT 147* 170 152 167   Medications:    . amLODipine  5 mg Oral Daily  . Chlorhexidine Gluconate Cloth  6 each Topical Q0600  . feeding supplement (ENSURE ENLIVE)  237 mL Oral BID BM  . heparin  5,000 Units Subcutaneous Q8H  . insulin aspart  0-5 Units Subcutaneous QHS  . insulin aspart  0-9 Units Subcutaneous TID WC  . insulin glargine  5 Units Subcutaneous Daily  . mouth rinse  15 mL Mouth Rinse BID  . polyethylene glycol  17 g Oral BID  . senna  2  tablet Oral BID  . sodium bicarbonate  1,300 mg Oral BID   Elmarie Shiley, MD 01/19/2020, 11:16 AM  Patient ID: Cindy Burns, female   DOB: Feb 21, 1946, 74 y.o.   MRN: [id number] [name] Progress Note   Assessment/ Plan:   1. Acute kidney Injury (baseline creatinine 0.9 in October, 2020): Suspected hemodynamically mediated acute kidney injury with severe volume depletion in the setting of UTI/DKA with likely evolution to ATN.  With significant metabolic acidosis on admission for which he underwent CRRT until 4/6 and thereafter ongoing monitoring for renal recovery without additional need for  dialysis.  Creatinine at this time appears to have plateaued at around 5.6 and anticipate imminent renal recovery.   2. Anemia: Iron stores adequate and without overt losses.  We will continue to follow in the setting of acute kidney injury. 3. Hypertension: Blood pressures intermittently elevated, will continue to follow on amlodipine to decide on additional antihypertensive therapy. 4.  Metabolic acidosis: Increase oral sodium bicarbonate dose and switch intravenous fluids to isotonic bicarbonate. 5.  Diabetic ketoacidosis: Resolved, ongoing glycemic management per primary service. 6.  Left ankle swelling/discomfort: Status post radiological imaging without osseous lesion.  Subjective:   Reports to be having some discomfort in her lower extremities "which started after hospitalization".  Denies any chest pain or shortness of breath.  Denies any nausea, vomiting or dysgeusia.   Objective:   BP (!) 150/57 (BP Location: Right Arm)   Pulse 80   Temp 97.9 F (36.6 C) (Oral)   Resp 16   Ht 5\' 2"  (1.575 m)   Wt 61.6 kg   SpO2 92%   BMI 24.84 kg/m   Intake/Output Summary (Last 24 hours) at 01/19/2020 1116 Last data filed at 01/19/2020 1029 Gross per 24 hour  Intake 2318.42 ml  Output 1480 ml  Net 838.42 ml   Weight change: 5.8 kg  Physical Exam: Gen: Comfortably sitting up in recliner, eating lunch CVS: Pulse regular rhythm, normal rate, S1 and S2 normal.  Left IJ temporary dialysis catheter. Resp: Clear to auscultation, no rales/rhonchi Abd: Soft, flat, nontender Ext: No lower extremity edema.  Some tenderness to palpation.  Imaging: No results found.  Labs: BMET Recent Labs  Lab 01/13/20 0507 01/14/20 0257 01/15/20 0537 01/16/20 0601 01/17/20 0615 01/18/20 0412 01/19/20 0415  NA 134* 135 135 136 135 136 134*  K 3.8 3.6 3.5 3.6 3.8 3.1* 3.5  CL 97* 99 102 106 105 104 98  CO2 21* 20* 19* 19* 16* 20* 24  GLUCOSE 110* 106* 101* 84 81 119* 100*  BUN 48* 60* 62* 60* 61*  62* 66*  CREATININE 5.06* 5.35* 5.54* 5.65* 5.67* 5.77* 5.83*  CALCIUM 7.4* 7.5* 7.1* 7.0* 7.0* 6.5* 6.4*  PHOS 3.5 2.8 2.8 3.6 3.9 3.9 3.0   CBC Recent Labs  Lab 01/16/20 0601 01/17/20 0615 01/18/20 0412 01/19/20 0415  WBC 4.5 9.1 5.5 6.9  NEUTROABS 3.1 7.7 4.0 5.4  HGB 8.3* 9.6* 7.5* 7.6*  HCT 26.2* 30.9* 23.5* 23.4*  MCV 89.1 91.4 90.4 89.7  PLT 147* 170 152 167   Medications:    . amLODipine  5 mg Oral Daily  . Chlorhexidine Gluconate Cloth  6 each Topical Q0600  . feeding supplement (ENSURE ENLIVE)  237 mL Oral BID BM  . heparin  5,000 Units Subcutaneous Q8H  . insulin aspart  0-5 Units Subcutaneous QHS  . insulin aspart  0-9 Units Subcutaneous TID WC  . insulin glargine  5 Units Subcutaneous  Daily  . mouth rinse  15 mL Mouth Rinse BID  . polyethylene glycol  17 g Oral BID  . senna  2 tablet Oral BID  . sodium bicarbonate  1,300 mg Oral BID   Elmarie Shiley, MD 01/19/2020, 11:16 AM  Patient ID: Cindy Burns, female   DOB: [birth year], 74 y.o.   MRN: [id number] [facility] Progress Note   Assessment/ Plan:   1. Acute kidney Injury (baseline creatinine 0.9 in October, 2020): Nonoliguric and likely with hemodynamically mediated acute kidney injury from severe volume depletion in the setting of UTI/DKA with likely evolution to ATN.  Underwent CRRT until 01/11/2020 following which she has not any limitations for dialysis and we are continuing supportive management with intravenous fluids/encouragement of oral intake.  She has fair urine output without any uremic symptoms or signs.  Prolonged plateau phase of ATN suspected.   2. Anemia: Iron stores adequate and without overt losses.  We will continue to follow in the setting of acute kidney injury. 3. Hypertension: Blood pressures intermittently elevated, will continue to follow on amlodipine to decide on additional antihypertensive therapy. 4.  Metabolic acidosis: Fluids yesterday switched to LR and today will  decrease oral sodium bicarbonate dose. 5.  Diabetic ketoacidosis: Resolved, ongoing glycemic management per primary service. 6.  Left ankle swelling/discomfort: Status post radiological imaging without osseous lesion.  Subjective:   Reports to be having some discomfort in her lower extremities "which started after hospitalization".  Denies any chest pain or shortness of breath.  Denies any nausea, vomiting or dysgeusia.   Objective:   BP (!) 150/57 (BP Location: Right Arm)   Pulse 80   Temp 97.9 F (36.6 C) (Oral)   Resp 16   Ht 5\' 2"  (1.575 m)   Wt 61.6 kg   SpO2 92%   BMI 24.84 kg/m   Intake/Output Summary (Last 24 hours) at 01/19/2020 1116 Last data filed at 01/19/2020 1029 Gross per 24 hour  Intake 2318.42 ml  Output 1480 ml  Net 838.42 ml   Weight change: 5.8 kg  Physical Exam: Gen: Comfortably sitting up in recliner, eating lunch CVS: Pulse regular rhythm, normal rate, S1 and S2 normal.  Left IJ temporary dialysis catheter. Resp: Clear to auscultation, no rales/rhonchi Abd: Soft, flat, nontender Ext: No lower extremity edema.  Some tenderness to palpation.  Imaging: No results found.  Labs: BMET Recent Labs  Lab 01/13/20 0507 01/14/20 0257 01/15/20 0537 01/16/20 0601 01/17/20 0615 01/18/20 0412 01/19/20 0415  NA 134* 135 135 136 135 136 134*  K 3.8 3.6 3.5 3.6 3.8 3.1* 3.5  CL 97* 99 102 106 105 104 98  CO2 21* 20* 19* 19* 16* 20* 24  GLUCOSE 110* 106* 101* 84 81 119* 100*  BUN 48* 60* 62* 60* 61* 62* 66*  CREATININE 5.06* 5.35* 5.54* 5.65* 5.67* 5.77* 5.83*  CALCIUM 7.4* 7.5* 7.1* 7.0* 7.0* 6.5* 6.4*  PHOS 3.5 2.8 2.8 3.6 3.9 3.9 3.0   CBC Recent Labs  Lab 01/16/20 0601 01/17/20 0615 01/18/20 0412 01/19/20 0415  WBC 4.5 9.1 5.5 6.9  NEUTROABS 3.1 7.7 4.0 5.4  HGB 8.3* 9.6* 7.5* 7.6*  HCT 26.2* 30.9* 23.5* 23.4*  MCV 89.1 91.4 90.4 89.7  PLT 147* 170 152 167   Medications:    . amLODipine  5 mg Oral Daily  . Chlorhexidine Gluconate Cloth   6 each Topical Q0600  . feeding supplement (ENSURE ENLIVE)  237 mL Oral BID BM  . heparin  5,000 Units Subcutaneous Q8H  . insulin aspart  0-5 Units Subcutaneous QHS  . insulin aspart  0-9 Units Subcutaneous TID WC  . insulin glargine  5 Units Subcutaneous Daily  . mouth rinse  15 mL Mouth Rinse BID  . polyethylene glycol  17 g Oral BID  . senna  2 tablet Oral BID  . sodium bicarbonate  1,300 mg Oral BID   Elmarie Shiley, MD 01/19/2020, 11:16 AM

## 2020-01-19 NOTE — Progress Notes (Signed)
Nutrition Follow-up  DOCUMENTATION CODES:   Not applicable  INTERVENTION:   Continue Ensure Enlive po BID, each supplement provides 350 kcal and 20 grams of protein  Add Magic cup BID with meals, each supplement provides 290 kcal and 9 grams of protein   NUTRITION DIAGNOSIS:   Inadequate oral intake related to inability to eat as evidenced by NPO status.  Being addressed via supplements  GOAL:   Patient will meet greater than or equal to 90% of their needs  Progressing  MONITOR:   PO intake, Supplement acceptance, Skin  REASON FOR ASSESSMENT:   Ventilator, Consult Enteral/tube feeding initiation and management  ASSESSMENT:   74 yo female admitted with respiratory failure, DKA, UTI, AKI. Intubated on admission. PMH includes COPD, DM, breast cancer.  4/06 Extubated, CRRT stopped  Pt hs not required iHD thus far  Recorded po intake 0-100% of meals; taking some Ensure  Current weight 61.6 kg; admit weight 52.9 kg  Labs: Corrected calcium 8.2, albumin 1.7, sodium 134 (L), Creatinine 5.83, BUN 66, CBGs 99-22 Meds: ss novolog, novolog with meals , lantus, LR at 75 ml/hr, miralax, sodium bicarbonate   Diet Order:   Diet Order            DIET DYS 3 Room service appropriate? Yes with Assist; Fluid consistency: Thin  Diet effective now              EDUCATION NEEDS:   Not appropriate for education at this time  Skin:  Skin Assessment: Skin Integrity Issues: Skin Integrity Issues:: DTI, Stage II, Other (Comment) DTI: L heel Stage II: coccyx Other: wound to L great toe; MASD to groin, perineum, buttocks  Last BM:  4/14  Height:   Ht Readings from Last 1 Encounters:  01/17/20 5\' 2"  (1.575 m)    Weight:   Wt Readings from Last 1 Encounters:  01/18/20 61.6 kg    Ideal Body Weight:  50 kg  BMI:  Body mass index is 24.84 kg/m.  Estimated Nutritional Needs:   Kcal:  1400-1600  Protein:  80-90 gm  Fluid:  >/= 1.5 L  Kerman Passey MS, RDN, LDN,  CNSC RD Pager Number and Weekend/On-Call After Hours Pager Located in [place]

## 2020-01-20 DIAGNOSIS — I10 Essential (primary) hypertension: Secondary | ICD-10-CM | POA: Diagnosis not present

## 2020-01-20 DIAGNOSIS — E872 Acidosis: Secondary | ICD-10-CM | POA: Diagnosis not present

## 2020-01-20 DIAGNOSIS — N179 Acute kidney failure, unspecified: Secondary | ICD-10-CM | POA: Diagnosis not present

## 2020-01-20 LAB — RENAL FUNCTION PANEL
Albumin: 1.7 g/dL — ABNORMAL LOW (ref 3.5–5.0)
Anion gap: 13 (ref 5–15)
BUN: 69 mg/dL — ABNORMAL HIGH (ref 8–23)
CO2: 22 mmol/L (ref 22–32)
Calcium: 6.5 mg/dL — ABNORMAL LOW (ref 8.9–10.3)
Chloride: 98 mmol/L (ref 98–111)
Creatinine, Ser: 5.82 mg/dL — ABNORMAL HIGH (ref 0.44–1.00)
GFR calc Af Amer: 8 mL/min — ABNORMAL LOW (ref >60)
GFR calc non Af Amer: 7 mL/min — ABNORMAL LOW (ref >60)
Glucose, Bld: 96 mg/dL (ref 70–99)
Phosphorus: 3.1 mg/dL (ref 2.5–4.6)
Potassium: 3.6 mmol/L (ref 3.5–5.1)
Sodium: 133 mmol/L — ABNORMAL LOW (ref 135–145)

## 2020-01-20 LAB — CBC WITH DIFFERENTIAL/PLATELET
Abs Immature Granulocytes: 0.05 10*3/uL (ref 0.00–0.07)
Basophils Absolute: 0 10*3/uL (ref 0.0–0.1)
Basophils Relative: 0 %
Eosinophils Absolute: 0.1 10*3/uL (ref 0.0–0.5)
Eosinophils Relative: 2 %
HCT: 23.6 % — ABNORMAL LOW (ref 36.0–46.0)
Hemoglobin: 7.6 g/dL — ABNORMAL LOW (ref 12.0–15.0)
Immature Granulocytes: 1 %
Lymphocytes Relative: 19 %
Lymphs Abs: 0.9 10*3/uL (ref 0.7–4.0)
MCH: 29.2 pg (ref 26.0–34.0)
MCHC: 32.2 g/dL (ref 30.0–36.0)
MCV: 90.8 fL (ref 80.0–100.0)
Monocytes Absolute: 0.3 10*3/uL (ref 0.1–1.0)
Monocytes Relative: 7 %
Neutro Abs: 3.5 10*3/uL (ref 1.7–7.7)
Neutrophils Relative %: 71 %
Platelets: 185 10*3/uL (ref 150–400)
RBC: 2.6 MIL/uL — ABNORMAL LOW (ref 3.87–5.11)
RDW: 16 % — ABNORMAL HIGH (ref 11.5–15.5)
WBC: 4.9 10*3/uL (ref 4.0–10.5)
nRBC: 0 % (ref 0.0–0.2)

## 2020-01-20 LAB — GLUCOSE, CAPILLARY
Glucose-Capillary: 99 mg/dL (ref 70–99)
Glucose-Capillary: 199 mg/dL — ABNORMAL HIGH (ref 70–99)
Glucose-Capillary: 140 mg/dL — ABNORMAL HIGH (ref 70–99)

## 2020-01-20 LAB — MAGNESIUM: Magnesium: 1.1 mg/dL — ABNORMAL LOW (ref 1.7–2.4)

## 2020-01-20 MED ORDER — MAGNESIUM OXIDE 400 (241.3 MG) MG PO TABS
400.0000 mg | ORAL_TABLET | Freq: Every day | ORAL | Status: DC
  Administered 2020-01-20 – 2020-01-21 (×2): 400 mg via ORAL
  Filled 2020-01-20 (×2): qty 1

## 2020-01-20 NOTE — Progress Notes (Signed)
Inpatient Diabetes Program Recommendations  AACE/ADA: New Consensus Statement on Inpatient Glycemic Control (2015)  Target Ranges:  Prepandial:   less than 140 mg/dL      Peak postprandial:   less than 180 mg/dL (1-2 hours)      Critically ill patients:  140 - 180 mg/dL   Lab Results  Component Value Date   GLUCAP 199 (H) 01/20/2020   HGBA1C 8.5 (H) 01/08/2020    Review of Glycemic Control  Diabetes history: DM2 Outpatient Diabetes medications: None Current orders for Inpatient glycemic control: Lantus 5 units QD, Novolog 0-9 units tidwc and hs  HgbA1C - 8.5% Post-prandial blood sugar 387 mg/dL last night and 199 mg/dL this am. Ensure Enlive ordered bid (has 45 g CHO per 237 mL)  Inpatient Diabetes Program Recommendations:     Consider adding Novolog 2 units tidwc if pt eating > 50% and consuming supplement.  Follow daily.  Thank you. Lorenda Peck, RD, LDN, CDE Inpatient Diabetes Coordinator [phone]

## 2020-01-20 NOTE — Progress Notes (Signed)
Progress Note    Cindy Burns  AVW:[id number] DOB: [birth year]  DOA: 01/08/2020 PCP: [name], MD    Brief Narrative:     Medical records reviewed and are as summarized below:  Cindy Burns is an 74 y.o. female with a past medical history significant for breast cancer, COPD, tobacco use, diabetes, MDD was admitted April 3 with acute renal failure with hyperkalemia, anion gap metabolic acidosis secondary to DKA, acute respiratory failure requiring intubation extubated April 6, acute kidney injury requiring CRRT.   Assessment/Plan:   Principal Problem:   Renal failure Active Problems:   Hypocalcemia   Metabolic acidosis   Acute respiratory failure with hypoxia (HCC)   CIGARETTE SMOKER   Hypertension   UTI (urinary tract infection)   Pressure injury of skin   Encounter for central line placement   Pain and swelling of ankle, left   #1.  Acute renal failure secondary to severe volume depletion in the setting of DKA/UTI.  Evaluated by nephrology.  Chart review indicates patient had significant metabolic acidosis on admission underwent CRRT until April 6.  Renal function improved but for the last 3 days creatinine has stabled at a little greater than 5.  -Continue LR -Per nephrology  2.  Acute respiratory failure with hypoxia.  Initially patient required intubation.  She was extubated on April 6.  Oxygen saturation levels been greater than 90% on room air.  Ambulated in hall and oxygen saturation level remained greater than 90% with ambulation -Incentive spirometry -Continue to mobilize -Monitor oxygen saturation level  #3.  Metabolic acidosis secondary to DKA with renal failure.  Creatinine greater than 5 but stable.  Occasions include sodium bicarb twice daily -Continue meds -Management per nephrology  #4.  Hypocalcemia/hypomagnesemia -Replete -Recheck  #5.  UTI.  She received Rocephin for 7 days.  Pansensitive E. coli noted on culture.  Blood cultures  negative.  She remains afebrile hemodynamically stable and nontoxic-appearing  #6.  Normocytic anemia.  Hemoglobin stable. -Monitor  #7.  Diabetes type 2.  Patient in DKA on admission.  Home medications include Januvia.  Hemoglobin A1c 8.5.  Patient was started on low-dose long-term insulin.  Improved control -Continue low-dose long-acting insulin -Sliding scale -Monitor  #8.  Hypertension.  Intermittent elevation in her blood pressure. -Continue Norvasc -Closely to evaluate for need for second agent  9.  COPD.  Patient is a smoker.  Not on home oxygen.  See #2. -Incentive spirometry -Smoking cessation counseling offered -Monitor oxygen saturation level  #10.  Pain and swelling of the left ankle.  X-rays were negative. -PT and OT   Family Communication/Anticipated D/C date and plan/Code Status   DVT prophylaxis: Lovenox ordered. Code Status: Full Code.  Family Communication: discussed plan with patient and answered questions Disposition Plan: Status is: Inpatient  Remains inpatient appropriate because:Inpatient level of care appropriate due to severity of illness   Dispo: The patient is from: Home              Anticipated d/c is to: Home              Anticipated d/c date is: 2 days              Patient currently is not medically stable to d/c.          Medical Consultants:    None.   Anti-Infectives:    None  Subjective:   Sitting up in bed eating breakfast smiling.  Reports she "feels  better".  Denies pain or shortness of breath  Objective:    Vitals:   01/19/20 1621 01/19/20 1948 01/20/20 0408 01/20/20 0755  BP: (!) 107/57 (!) 154/46 (!) 121/58 (!) 124/59  Pulse: 83 92 84 86  Resp: 16 18 18 18   Temp: 98.6 F (37 C) 98.1 F (36.7 C) 97.8 F (36.6 C) 98 F (36.7 C)  TempSrc: Oral Oral Oral Oral  SpO2: 95% 98% 95% 96%  Weight:      Height:        Intake/Output Summary (Last 24 hours) at 01/20/2020 0955 Last data filed at 01/20/2020  0955 Gross per 24 hour  Intake 2445 ml  Output 1360 ml  Net 1085 ml   Filed Weights   01/17/20 1130 01/17/20 2101 01/18/20 2100  Weight: 55.8 kg 55.7 kg 61.6 kg    Exam: General: Awake alert somewhat ill-appearing no acute distress CV: Regular rate and rhythm no murmur gallop or rub trace lower extremity edema bilaterally Respiratory: Mild increased work of breathing with conversation.  Breath sounds are distant with fair air movement.  No crackles no wheeze Abdomen: Obese soft positive bowel sounds throughout nontender to palpation no guarding or rebounding Musculoskeletal: Joints without swelling/erythema full range of motion nontender to palpation Neuro: Awake alert oriented x3 speech clear facial symmetry  Data Reviewed:   I have personally reviewed following labs and imaging studies:  Labs: Labs show the following:   Basic Metabolic Panel: Recent Labs  Lab 01/16/20 0601 01/16/20 0601 01/17/20 0615 01/17/20 0615 01/18/20 0412 01/18/20 0412 01/19/20 0415 01/20/20 0327  NA 136  --  135  --  136  --  134* 133*  K 3.6   < > 3.8   < > 3.1*   < > 3.5 3.6  CL 106  --  105  --  104  --  98 98  CO2 19*  --  16*  --  20*  --  24 22  GLUCOSE 84  --  81  --  119*  --  100* 96  BUN 60*  --  61*  --  62*  --  66* 69*  CREATININE 5.65*  --  5.67*  --  5.77*  --  5.83* 5.82*  CALCIUM 7.0*  --  7.0*  --  6.5*  --  6.4* 6.5*  MG  --   --   --   --   --   --   --  1.1*  PHOS 3.6  --  3.9  --  3.9  --  3.0 3.1   < > = values in this interval not displayed.   GFR Estimated Creatinine Clearance: 7.7 mL/min (A) (by C-G formula based on SCr of 5.82 mg/dL (H)). Liver Function Tests: Recent Labs  Lab 01/16/20 0601 01/17/20 0615 01/18/20 0412 01/19/20 0415 01/20/20 0327  ALBUMIN 1.9* 2.2* 1.7* 1.8* 1.7*   No results for input(s): LIPASE, AMYLASE in the last 168 hours. No results for input(s): AMMONIA in the last 168 hours. Coagulation profile No results for input(s): INR,  PROTIME in the last 168 hours.  CBC: Recent Labs  Lab 01/16/20 0601 01/17/20 0615 01/18/20 0412 01/19/20 0415 01/20/20 0555  WBC 4.5 9.1 5.5 6.9 4.9  NEUTROABS 3.1 7.7 4.0 5.4 3.5  HGB 8.3* 9.6* 7.5* 7.6* 7.6*  HCT 26.2* 30.9* 23.5* 23.4* 23.6*  MCV 89.1 91.4 90.4 89.7 90.8  PLT 147* 170 152 167 185   Cardiac Enzymes: No results for input(s): CKTOTAL, CKMB,  CKMBINDEX, TROPONINI in the last 168 hours. BNP (last 3 results) No results for input(s): PROBNP in the last 8760 hours. CBG: Recent Labs  Lab 01/19/20 1117 01/19/20 1620 01/19/20 2154 01/19/20 2214 01/20/20 0715  GLUCAP 159* 92 387* 169* 99   D-Dimer: No results for input(s): DDIMER in the last 72 hours. Hgb A1c: No results for input(s): HGBA1C in the last 72 hours. Lipid Profile: No results for input(s): CHOL, HDL, LDLCALC, TRIG, CHOLHDL, LDLDIRECT in the last 72 hours. Thyroid function studies: No results for input(s): TSH, T4TOTAL, T3FREE, THYROIDAB in the last 72 hours.  Invalid input(s): FREET3 Anemia work up: No results for input(s): VITAMINB12, FOLATE, FERRITIN, TIBC, IRON, RETICCTPCT in the last 72 hours. Sepsis Labs: Recent Labs  Lab 01/17/20 0615 01/18/20 0412 01/19/20 0415 01/20/20 0555  WBC 9.1 5.5 6.9 4.9    Microbiology No results found for this or any previous visit (from the past 240 hour(s)).  Procedures and diagnostic studies:  No results found.  Medications:   . amLODipine  5 mg Oral Daily  . Chlorhexidine Gluconate Cloth  6 each Topical Q0600  . feeding supplement (ENSURE ENLIVE)  237 mL Oral BID BM  . heparin  5,000 Units Subcutaneous Q8H  . insulin aspart  0-5 Units Subcutaneous QHS  . insulin aspart  0-9 Units Subcutaneous TID WC  . insulin glargine  5 Units Subcutaneous Daily  . magnesium oxide  400 mg Oral Daily  . mouth rinse  15 mL Mouth Rinse BID  . polyethylene glycol  17 g Oral BID  . senna  2 tablet Oral BID  . sodium bicarbonate  1,300 mg Oral BID    Continuous Infusions: . sodium chloride Stopped (01/13/20 0735)  . lactated ringers 75 mL/hr at 01/20/20 0410     LOS: 12 days   Radene Gunning NP [facility]   Burns to contact the [facility] Attending or Consulting provider [place] or covering provider during after hours [place], for this patient?  1. Check the care team in [facility] and look for a) attending/consulting TR[facility] provider listed and b) the [facility] team listed 2. Log into [email or web address] and use [facility]'s universal password to access. If you do not have the password, please contact the hospital operator. 3. Locate the [facility] provider you are looking for under [facility] and page to a number that you can be directly reached. 4. If you still have difficulty reaching the provider, please page the [facility] (Director on Call) for the Hospitalists listed on amion for assistance.  01/20/2020, 9:55 AM

## 2020-01-20 NOTE — Progress Notes (Addendum)
Patient ID: Cindy Burns, female   DOB: [birth year], 74 y.o.   MRN: [id number] [facility] Progress Note   Assessment/ Plan:   1. Acute kidney Injury (baseline creatinine 0.9 in October, 2020): Suspected hemodynamically mediated acute kidney injury with severe volume depletion in the setting of UTI/DKA with likely evolution to ATN.  With significant metabolic acidosis on admission for which he underwent CRRT until 4/6 and thereafter ongoing monitoring for renal recovery without additional need for dialysis.  Creatinine at this time appears to have plateaued at around 5.6 and anticipate imminent renal recovery.  I will give a single dose of potassium and switch fluids to LR.   2. Anemia: Iron stores adequate and without overt losses.  We will continue to follow in the setting of acute kidney injury. 3. Hypertension: Blood pressures intermittently elevated, on amlodipine monotherapy. 4.  Metabolic acidosis: Increased oral sodium bicarbonate dose and today will switch her to LR. 5.  Diabetic ketoacidosis: Resolved, ongoing glycemic management per primary service. 6.  Left ankle swelling/discomfort: Status post radiological imaging without osseous lesion.  Subjective:   Denies any chest pain or shortness of breath, has intermittent cough.   Objective:   BP (!) 124/59 (BP Location: Right Arm)   Pulse 86   Temp 98 F (36.7 C) (Oral)   Resp 18   Ht 5\' 2"  (1.575 m)   Wt 61.6 kg   SpO2 96%   BMI 24.84 kg/m   Intake/Output Summary (Last 24 hours) at 01/20/2020 1129 Last data filed at 01/20/2020 0955 Gross per 24 hour  Intake 2445 ml  Output 1160 ml  Net 1285 ml   Weight change:   Physical Exam: Gen: Comfortably resting in bed watching television CVS: Pulse regular rhythm, normal rate, S1 and S2 normal.  Left IJ temporary dialysis catheter. Resp: Clear to auscultation, no rales/rhonchi Abd: Soft, flat, nontender Ext: No lower extremity edema.  Some tenderness to  palpation.  Imaging: No results found.  Labs: BMET Recent Labs  Lab 01/14/20 0257 01/15/20 0537 01/16/20 0601 01/17/20 0615 01/18/20 0412 01/19/20 0415 01/20/20 0327  NA 135 135 136 135 136 134* 133*  K 3.6 3.5 3.6 3.8 3.1* 3.5 3.6  CL 99 102 106 105 104 98 98  CO2 20* 19* 19* 16* 20* 24 22  GLUCOSE 106* 101* 84 81 119* 100* 96  BUN 60* 62* 60* 61* 62* 66* 69*  CREATININE 5.35* 5.54* 5.65* 5.67* 5.77* 5.83* 5.82*  CALCIUM 7.5* 7.1* 7.0* 7.0* 6.5* 6.4* 6.5*  PHOS 2.8 2.8 3.6 3.9 3.9 3.0 3.1   CBC Recent Labs  Lab 01/17/20 0615 01/18/20 0412 01/19/20 0415 01/20/20 0555  WBC 9.1 5.5 6.9 4.9  NEUTROABS 7.7 4.0 5.4 3.5  HGB 9.6* 7.5* 7.6* 7.6*  HCT 30.9* 23.5* 23.4* 23.6*  MCV 91.4 90.4 89.7 90.8  PLT 170 152 167 185   Medications:    . amLODipine  5 mg Oral Daily  . Chlorhexidine Gluconate Cloth  6 each Topical Q0600  . feeding supplement (ENSURE ENLIVE)  237 mL Oral BID BM  . heparin  5,000 Units Subcutaneous Q8H  . insulin aspart  0-5 Units Subcutaneous QHS  . insulin aspart  0-9 Units Subcutaneous TID WC  . insulin glargine  5 Units Subcutaneous Daily  . magnesium oxide  400 mg Oral Daily  . mouth rinse  15 mL Mouth Rinse BID  . polyethylene glycol  17 g Oral BID  . senna  2 tablet Oral BID  .  sodium bicarbonate  1,300 mg Oral BID   Elmarie Shiley, MD 01/20/2020, 11:29 AM  Patient ID: Cindy Burns, female   DOB: 1946-02-21, 74 y.o.   MRN: [id number] [facility] Progress Note   Assessment/ Plan:   1. Acute kidney Injury (baseline creatinine 0.9 in October, 2020): Suspected hemodynamically mediated acute kidney injury with severe volume depletion in the setting of UTI/DKA with likely evolution to ATN.  With significant metabolic acidosis on admission for which he underwent CRRT until 4/6 and thereafter ongoing monitoring for renal recovery without additional need for dialysis.  Creatinine at this time appears to have plateaued at around 5.6 and  anticipate imminent renal recovery.   2. Anemia: Iron stores adequate and without overt losses.  We will continue to follow in the setting of acute kidney injury. 3. Hypertension: Blood pressures intermittently elevated, will continue to follow on amlodipine to decide on additional antihypertensive therapy. 4.  Metabolic acidosis: Increase oral sodium bicarbonate dose and switch intravenous fluids to isotonic bicarbonate. 5.  Diabetic ketoacidosis: Resolved, ongoing glycemic management per primary service. 6.  Left ankle swelling/discomfort: Status post radiological imaging without osseous lesion.  Subjective:   Reports to be having some discomfort in her lower extremities "which started after hospitalization".  Denies any chest pain or shortness of breath.  Denies any nausea, vomiting or dysgeusia.   Objective:   BP (!) 124/59 (BP Location: Right Arm)   Pulse 86   Temp 98 F (36.7 C) (Oral)   Resp 18   Ht 5\' 2"  (1.575 m)   Wt 61.6 kg   SpO2 96%   BMI 24.84 kg/m   Intake/Output Summary (Last 24 hours) at 01/20/2020 1129 Last data filed at 01/20/2020 0955 Gross per 24 hour  Intake 2445 ml  Output 1160 ml  Net 1285 ml   Weight change:   Physical Exam: Gen: Comfortably sitting up in recliner, eating lunch CVS: Pulse regular rhythm, normal rate, S1 and S2 normal.  Left IJ temporary dialysis catheter. Resp: Clear to auscultation, no rales/rhonchi Abd: Soft, flat, nontender Ext: No lower extremity edema.  Some tenderness to palpation.  Imaging: No results found.  Labs: BMET Recent Labs  Lab 01/14/20 0257 01/15/20 0537 01/16/20 0601 01/17/20 0615 01/18/20 0412 01/19/20 0415 01/20/20 0327  NA 135 135 136 135 136 134* 133*  K 3.6 3.5 3.6 3.8 3.1* 3.5 3.6  CL 99 102 106 105 104 98 98  CO2 20* 19* 19* 16* 20* 24 22  GLUCOSE 106* 101* 84 81 119* 100* 96  BUN 60* 62* 60* 61* 62* 66* 69*  CREATININE 5.35* 5.54* 5.65* 5.67* 5.77* 5.83* 5.82*  CALCIUM 7.5* 7.1* 7.0* 7.0* 6.5*  6.4* 6.5*  PHOS 2.8 2.8 3.6 3.9 3.9 3.0 3.1   CBC Recent Labs  Lab 01/17/20 0615 01/18/20 0412 01/19/20 0415 01/20/20 0555  WBC 9.1 5.5 6.9 4.9  NEUTROABS 7.7 4.0 5.4 3.5  HGB 9.6* 7.5* 7.6* 7.6*  HCT 30.9* 23.5* 23.4* 23.6*  MCV 91.4 90.4 89.7 90.8  PLT 170 152 167 185   Medications:    . amLODipine  5 mg Oral Daily  . Chlorhexidine Gluconate Cloth  6 each Topical Q0600  . feeding supplement (ENSURE ENLIVE)  237 mL Oral BID BM  . heparin  5,000 Units Subcutaneous Q8H  . insulin aspart  0-5 Units Subcutaneous QHS  . insulin aspart  0-9 Units Subcutaneous TID WC  . insulin glargine  5 Units Subcutaneous Daily  . magnesium oxide  400 mg Oral Daily  . mouth rinse  15 mL Mouth Rinse BID  . polyethylene glycol  17 g Oral BID  . senna  2 tablet Oral BID  . sodium bicarbonate  1,300 mg Oral BID   Elmarie Shiley, MD 01/20/2020, 11:29 AM  Patient ID: Cindy Burns, female   DOB: 08-28-1946, 74 y.o.   MRN: [id number] [facility] Progress Note   Assessment/ Plan:   1. Acute kidney Injury (baseline creatinine 0.9 in October, 2020): Nonoliguric and likely with hemodynamically mediated acute kidney injury from severe volume depletion in the setting of UTI/DKA with likely evolution to ATN; remains at plateau phase at this time without evidence of renal recovery yet.  Underwent CRRT until 01/11/2020 following which she has not had indications for dialysis.  Temporary left IJ dialysis catheter in situ.  Discontinue IV fluids. 2. Anemia: Iron stores adequate and without overt losses.  We will continue to follow in the setting of acute kidney injury. 3. Hypertension: Blood pressures intermittently elevated, will continue to follow on amlodipine to decide on additional antihypertensive therapy. 4.  Metabolic acidosis: Improved with oral sodium bicarbonate/IV fluids.  Monitor with labs. 5.  Diabetic ketoacidosis: Resolved, ongoing glycemic management per primary service. 6.  Left ankle  swelling/discomfort: Status post radiological imaging without osseous lesion.  Subjective:   Reports to be feeling fair,   Objective:   BP (!) 124/59 (BP Location: Right Arm)   Pulse 86   Temp 98 F (36.7 C) (Oral)   Resp 18   Ht 5\' 2"  (1.575 m)   Wt 61.6 kg   SpO2 96%   BMI 24.84 kg/m   Intake/Output Summary (Last 24 hours) at 01/20/2020 1129 Last data filed at 01/20/2020 0955 Gross per 24 hour  Intake 2445 ml  Output 1160 ml  Net 1285 ml   Weight change:   Physical Exam: Gen: Comfortably sitting up in recliner, eating lunch CVS: Pulse regular rhythm, normal rate, S1 and S2 normal.  Left IJ temporary dialysis catheter. Resp: Coarse/transmitted breath sounds bilaterally, no distinct rales/rhonchi Abd: Soft, flat, nontender Ext: No lower extremity edema.  Some tenderness to palpation.  Imaging: No results found.  Labs: BMET Recent Labs  Lab 01/14/20 0257 01/15/20 0537 01/16/20 0601 01/17/20 0615 01/18/20 0412 01/19/20 0415 01/20/20 0327  NA 135 135 136 135 136 134* 133*  K 3.6 3.5 3.6 3.8 3.1* 3.5 3.6  CL 99 102 106 105 104 98 98  CO2 20* 19* 19* 16* 20* 24 22  GLUCOSE 106* 101* 84 81 119* 100* 96  BUN 60* 62* 60* 61* 62* 66* 69*  CREATININE 5.35* 5.54* 5.65* 5.67* 5.77* 5.83* 5.82*  CALCIUM 7.5* 7.1* 7.0* 7.0* 6.5* 6.4* 6.5*  PHOS 2.8 2.8 3.6 3.9 3.9 3.0 3.1   CBC Recent Labs  Lab 01/17/20 0615 01/18/20 0412 01/19/20 0415 01/20/20 0555  WBC 9.1 5.5 6.9 4.9  NEUTROABS 7.7 4.0 5.4 3.5  HGB 9.6* 7.5* 7.6* 7.6*  HCT 30.9* 23.5* 23.4* 23.6*  MCV 91.4 90.4 89.7 90.8  PLT 170 152 167 185   Medications:    . amLODipine  5 mg Oral Daily  . Chlorhexidine Gluconate Cloth  6 each Topical Q0600  . feeding supplement (ENSURE ENLIVE)  237 mL Oral BID BM  . heparin  5,000 Units Subcutaneous Q8H  . insulin aspart  0-5 Units Subcutaneous QHS  . insulin aspart  0-9 Units Subcutaneous TID WC  . insulin glargine  5 Units Subcutaneous Daily  .  magnesium oxide   400 mg Oral Daily  . mouth rinse  15 mL Mouth Rinse BID  . polyethylene glycol  17 g Oral BID  . senna  2 tablet Oral BID  . sodium bicarbonate  1,300 mg Oral BID   Elmarie Shiley, MD 01/20/2020, 11:29 AM

## 2020-01-20 NOTE — TOC Progression Note (Signed)
Transition of Care [facility]) - Progression Note    Patient Details  Name: Cindy Burns MRN: [id number] Date of Birth: [birth year]  Transition of Care [facility]) CM/SW Contact  [name], LCSW Phone Number: 01/20/2020, 5:58 PM  Clinical Narrative:   Visited with patient at the bedside regarding her [facility] choices for personal care services and she requested that her friend [employer] be contacted. Received call from [facility] with [facility] [phone]) and mini-assessment completed. She requested names of [place] agency and was advised that CSW would talk with patient's friend and get back with her.   Talked with friend Murvin Donning [phone]) and provided her with information needed to get to [facility]. Ms. Darcella Cheshire selected: [facility] Lemmings, and [facility]. Requested a third choice and advised Ms. Darcella Cheshire that return call would be made. Contacted Ms. Bowles later and she provided these choice: [place]. Other [place] companies that can be used if needed are: [facility], [place]. [facility]; [place] services - [address] [place]   Talked later with [facility] and provided her with [facility] and [facility]. CSW will continue to follow and await call from [facility] with [facility].     Expected Discharge Plan: [place] [facility](CSW will also look into PCS services for patient) Barriers to Discharge: Continued Medical Work up  Expected Discharge Plan and Services Expected Discharge Plan: [place] [facility](CSW will also look into PCS services for patient) In-house Referral: Clinical Social Work Discharge Planning Services: Other - See comment(Will apply for PCS services for patient and talk with patient about [facility] services (list will be provided))   Living arrangements for the past 2 months: Single Family Home                                        Social Determinants of Health (SDOH) Interventions    Readmission Risk Interventions No flowsheet data found.

## 2020-01-21 ENCOUNTER — Inpatient Hospital Stay (HOSPITAL_COMMUNITY): Payer: Medicare Other

## 2020-01-21 DIAGNOSIS — N179 Acute kidney failure, unspecified: Secondary | ICD-10-CM | POA: Diagnosis not present

## 2020-01-21 DIAGNOSIS — I10 Essential (primary) hypertension: Secondary | ICD-10-CM | POA: Diagnosis not present

## 2020-01-21 DIAGNOSIS — E872 Acidosis: Secondary | ICD-10-CM | POA: Diagnosis not present

## 2020-01-21 LAB — CBC WITH DIFFERENTIAL/PLATELET
Abs Immature Granulocytes: 0.06 10*3/uL (ref 0.00–0.07)
Basophils Absolute: 0 10*3/uL (ref 0.0–0.1)
Basophils Relative: 0 %
Eosinophils Absolute: 0.1 10*3/uL (ref 0.0–0.5)
Eosinophils Relative: 2 %
HCT: 24.4 % — ABNORMAL LOW (ref 36.0–46.0)
Hemoglobin: 7.9 g/dL — ABNORMAL LOW (ref 12.0–15.0)
Immature Granulocytes: 1 %
Lymphocytes Relative: 23 %
Lymphs Abs: 1 10*3/uL (ref 0.7–4.0)
MCH: 29.6 pg (ref 26.0–34.0)
MCHC: 32.4 g/dL (ref 30.0–36.0)
MCV: 91.4 fL (ref 80.0–100.0)
Monocytes Absolute: 0.4 10*3/uL (ref 0.1–1.0)
Monocytes Relative: 8 %
Neutro Abs: 3 10*3/uL (ref 1.7–7.7)
Neutrophils Relative %: 66 %
Platelets: 218 10*3/uL (ref 150–400)
RBC: 2.67 MIL/uL — ABNORMAL LOW (ref 3.87–5.11)
RDW: 16.2 % — ABNORMAL HIGH (ref 11.5–15.5)
WBC: 4.5 10*3/uL (ref 4.0–10.5)
nRBC: 0 % (ref 0.0–0.2)

## 2020-01-21 LAB — RENAL FUNCTION PANEL
Albumin: 1.8 g/dL — ABNORMAL LOW (ref 3.5–5.0)
Anion gap: 11 (ref 5–15)
BUN: 69 mg/dL — ABNORMAL HIGH (ref 8–23)
CO2: 26 mmol/L (ref 22–32)
Calcium: 6.6 mg/dL — ABNORMAL LOW (ref 8.9–10.3)
Chloride: 100 mmol/L (ref 98–111)
Creatinine, Ser: 5.85 mg/dL — ABNORMAL HIGH (ref 0.44–1.00)
GFR calc Af Amer: 8 mL/min — ABNORMAL LOW (ref >60)
GFR calc non Af Amer: 7 mL/min — ABNORMAL LOW (ref >60)
Glucose, Bld: 84 mg/dL (ref 70–99)
Phosphorus: 2.7 mg/dL (ref 2.5–4.6)
Potassium: 3.9 mmol/L (ref 3.5–5.1)
Sodium: 137 mmol/L (ref 135–145)

## 2020-01-21 LAB — GLUCOSE, CAPILLARY
Glucose-Capillary: 92 mg/dL (ref 70–99)
Glucose-Capillary: 178 mg/dL — ABNORMAL HIGH (ref 70–99)
Glucose-Capillary: 118 mg/dL — ABNORMAL HIGH (ref 70–99)
Glucose-Capillary: 114 mg/dL — ABNORMAL HIGH (ref 70–99)

## 2020-01-21 LAB — MAGNESIUM: Magnesium: 1.1 mg/dL — ABNORMAL LOW (ref 1.7–2.4)

## 2020-01-21 MED ORDER — IPRATROPIUM-ALBUTEROL 0.5-2.5 (3) MG/3ML IN SOLN
3.0000 mL | Freq: Four times a day (QID) | RESPIRATORY_TRACT | Status: DC
  Filled 2020-01-21: qty 3

## 2020-01-21 MED ORDER — IPRATROPIUM-ALBUTEROL 0.5-2.5 (3) MG/3ML IN SOLN: 3.0000 mL | RESPIRATORY_TRACT | Status: DC | PRN

## 2020-01-21 MED ORDER — MAGNESIUM SULFATE 2 GM/50ML IV SOLN
2.0000 g | Freq: Once | INTRAVENOUS | Status: AC
  Administered 2020-01-21: 2 g via INTRAVENOUS
  Filled 2020-01-21: qty 50

## 2020-01-21 NOTE — Progress Notes (Signed)
PT Progress Note for Charges    01/21/20 1500  PT Visit Information  Last PT Received On 01/21/20  PT General Charges  $$ ACUTE PT VISIT 1 Visit  PT Treatments  $Gait Training 8-22 mins  Anastasio Champion, DPT  Acute Rehabilitation Services Pager [phone] Office [phone]

## 2020-01-21 NOTE — Progress Notes (Signed)
Orthopedic Tech Progress Note Patient Details:  Cindy Burns [birth year] [id number]  Ortho Devices Type of Ortho Device: Prafo boot/shoe Ortho Device/Splint Location: LLE Ortho Device/Splint Interventions: Ordered, Application   Post Interventions Patient Tolerated: Well Instructions Provided: Care of device   Janit Pagan 01/21/2020, 1:00 PM

## 2020-01-21 NOTE — Evaluation (Signed)
Clinical/Bedside Swallow Evaluation Patient Details  Name: Cindy Burns MRN: [id number] Date of Birth: 1945/10/19  Today's Date: 01/21/2020 Time: SLP Start Time (ACUTE ONLY): 3716 SLP Stop Time (ACUTE ONLY): 1404 SLP Time Calculation (min) (ACUTE ONLY): 9 min  Past Medical History:  Past Medical History:  Diagnosis Date  . AKI (acute kidney injury) ([place]) 01/2020  . Cancer [facility]) 2005   Breast Cancer  . COPD (chronic obstructive pulmonary disease) ([place])   . Depression   . Diabetes mellitus without complication ([place])   . Hyperlipidemia    Past Surgical History:  Past Surgical History:  Procedure Laterality Date  . BREAST SURGERY  2005  . CATARACT EXTRACTION    . CHOLECYSTECTOMY  2013   HPI:   74 year old woman admitted on 01/08/20 to [facility] and subsequently transferred to [facility] with respiratory failure requring intubation, DKA, UTI, hyperkalemia and renal failure requiring CRRT. Extubated 01/11/20. PMH: COPD, breast cancer, Dm, MDD. Swallow evaluated on 4/7 s/p extubation. Dys3/thins were recommended.   Assessment / Plan / Recommendation Clinical Impression  Pt presents with resolved dysphagia.  No focal CN deficits present.  There was adequate mastication of regular solids with presence of dentures, brisk swallow response, and no s/s of aspiration, even when taxed with mixed solid/liquid consistencies.  Synchrony of breathing/swallowing patterns were [facility].  Advance diet to regular solids (carb modified), thin liquids; may have pills whole in water.  No SLP f/u is needed - our service will sign off.  SLP Visit Diagnosis: Dysphagia, unspecified (R13.10)    Aspiration Risk    minimal   Diet Recommendation   regular solids, thin liquids  Medication Administration: Whole meds with liquid    Other  Recommendations Oral Care Recommendations: Oral care BID   Follow up Recommendations None        Swallow Study   General HPI:  74 year old woman admitted on 01/08/20 to [facility] and subsequently  transferred to [facility] with respiratory failure requring intubation, DKA, UTI, hyperkalemia and renal failure requiring CRRT. Extubated 01/11/20. PMH: COPD, breast cancer, Dm, MDD. Type of Study: Bedside Swallow Evaluation Previous Swallow Assessment: (yes  01/12/20) Diet Prior to this Study: Dysphagia 3 (soft);Thin liquids Temperature Spikes Noted: No Respiratory Status: Room air History of Recent Intubation: Yes Length of Intubations (days): 3 days Date extubated: 01/11/20 Behavior/Cognition: Alert;Cooperative;Pleasant mood Oral Cavity Assessment: Within Functional Limits Oral Care Completed by SLP: No Oral Cavity - Dentition: Dentures, top;Dentures, bottom Self-Feeding Abilities: Able to feed self Patient Positioning: Upright in chair Baseline Vocal Quality: Normal Volitional Cough: Strong Volitional Swallow: Able to elicit    Oral/Motor/Sensory Function Overall Oral Motor/Sensory Function: Within functional limits   Ice Chips Ice chips: Not tested   Thin Liquid Thin Liquid: Within functional limits Presentation: Cup;Straw    Nectar Thick Nectar Thick Liquid: Not tested   Honey Thick Honey Thick Liquid: Not tested   Puree Puree: Within functional limits   Solid     Solid: Within functional limits      Juan Quam Laurice 01/21/2020,2:04 PM  Estill Bamberg L. Tivis Ringer, [place] Office number [phone] Pager [phone]

## 2020-01-21 NOTE — Progress Notes (Signed)
Progress Note    Cindy Burns  UVO:[id number] DOB: December 13, 1945  DOA: 01/08/2020 PCP: [name], MD    Brief Narrative:    Medical records reviewed and are as summarized below:  Cindy Burns is an 74 y.o. female Cindy Burns is an 74 y.o. female with a past medical history significant for breast cancer, COPD, tobacco use, diabetes, MDD was admitted April 3 with acute renal failure with hyperkalemia, anion gap metabolic acidosis secondary to DKA, acute respiratory failure requiring intubation extubated April 6, acute kidney injury requiring CRRT.    Assessment/Plan:   Principal Problem:   Renal failure Active Problems:   Hypocalcemia   Metabolic acidosis   Acute respiratory failure with hypoxia (HCC)   C[employer]ARET[employer]MO[employer]   Hypertension   UTI (urinary tract infection)   Pressure injury of skin   Encounter for central line placement   Pain and swelling of ankle, left  #1.  Acute renal failure secondary to severe volume depletion in the setting of DKA/UTI.  Evaluated by nephrology who opine creatinine plateauing but anticipate imminent recovery .  Chart review indicates patient had significant metabolic acidosis on admission underwent CRRT until April 6.  Renal function improved but for the last 4 days creatinine has stabled at a little greater than 5.  -Continue LR -Per nephrology  2.  Acute respiratory failure with hypoxia.  Initially patient required intubation.  She was extubated on April 6.  Oxygen saturation levels been greater than 90% on room air.  Ambulated in hall and oxygen saturation level remained greater than 90% with ambulation. Frequent productive cough today. No wheeze -prn nebs -Incentive spirometry -Continue to mobilize -Monitor oxygen saturation level  #3.  Metabolic acidosis secondary to DKA with renal failure.  Creatinine greater than 5 but stable. Serum CO2 26. medications include sodium bicarb twice daily -Continue meds -Management per  nephrology  #4.  Hypocalcemia/hypomagnesemia -Replete -Recheck  #5.  UTI.  She received Rocephin for 7 days.  Pansensitive E. coli noted on culture.  Blood cultures negative.  She remains afebrile hemodynamically stable and nontoxic-appearing  #6.  Normocytic anemia.  Hemoglobin stable. -Monitor  #7.  Diabetes type 2.  Patient in DKA on admission.  Home medications include Januvia.  Hemoglobin A1c 8.5.  Patient was started on low-dose long-term insulin.  Improved control -Continue low-dose long-acting insulin -Sliding scale -appetite continues to wax and wane  -repeat speech eval for upgrade in diet plan. Vs dysphagia 3 -Monitor  #8.  Hypertension. Fair control. -Continue Norvasc -Closely to evaluate for need for second agent  9.  COPD.  Patient is a smoker.  Not on home oxygen. Increase cough this am. No wheeze  See #2. -Incentive spirometry -Smoking cessation counseling offered -Monitor oxygen saturation level  #10.  Pain and swelling of the left ankle.  X-rays were negative. -PT and OT     Family Communication/Anticipated D/C date and plan/Code Status   DVT prophylaxis: Lovenox ordered. Code Status: Full Code.  Family Communication: discussed plan with patient and all questions answered Disposition Plan: Status is: Inpatient  Remains inpatient appropriate because:Inpatient level of care appropriate due to severity of illness   Dispo: The patient is from: Home              Anticipated d/c is to: Home              Anticipated d/c date is: 2 days  Patient currently is not medically stable to d/c.          Medical Consultants:    [facility] nephrology   Anti-Infectives:    None  Subjective:   Sitting in chair not eating breakfast as unhappy with food. Requesting "coke". Offered her diet soda.   Objective:    Vitals:   01/20/20 1626 01/20/20 2008 01/21/20 0525 01/21/20 0821  BP: 115/90 116/71 (!) 138/51   Pulse: 83 85 87 81   Resp: 18 16 16 16   Temp: 97.8 F (36.6 C) 98.7 F (37.1 C) 98.6 F (37 C)   TempSrc: Oral Oral Oral   SpO2: 96% 100% 95% 96%  Weight:      Height:        Intake/Output Summary (Last 24 hours) at 01/21/2020 1124 Last data filed at 01/21/2020 1100 Gross per 24 hour  Intake 797 ml  Output 0 ml  Net 797 ml   Filed Weights   01/17/20 1130 01/17/20 2101 01/18/20 2100  Weight: 55.8 kg 55.7 kg 61.6 kg    Exam: General: Awake alert somewhat ill-appearing no acute distress up in chair CV: Regular rate and rhythm no murmur gallop or rub trace lower extremity edema bilaterally Respiratory: Mild increased work of breathing with conversation.  Breath sounds are distant with fair air movement.  No crackles no wheeze Abdomen: Obese soft positive bowel sounds throughout nontender to palpation no guarding or rebounding Musculoskeletal: Joints without swelling/erythema full range of motion nontender to palpation Neuro: Awake alert oriented x3 speech clear facial symmetry  Data Reviewed:   I have personally reviewed following labs and imaging studies:  Labs: Labs show the following:   Basic Metabolic Panel: Recent Labs  Lab 01/17/20 0615 01/17/20 0615 01/18/20 0412 01/18/20 0412 01/19/20 0415 01/19/20 0415 01/20/20 0327 01/21/20 0410  NA 135  --  136  --  134*  --  133* 137  K 3.8   < > 3.1*   < > 3.5   < > 3.6 3.9  CL 105  --  104  --  98  --  98 100  CO2 16*  --  20*  --  24  --  22 26  GLUCOSE 81  --  119*  --  100*  --  96 84  BUN 61*  --  62*  --  66*  --  69* 69*  CREATININE 5.67*  --  5.77*  --  5.83*  --  5.82* 5.85*  CALCIUM 7.0*  --  6.5*  --  6.4*  --  6.5* 6.6*  MG  --   --   --   --   --   --  1.1* 1.1*  PHOS 3.9  --  3.9  --  3.0  --  3.1 2.7   < > = values in this interval not displayed.   GFR Estimated Creatinine Clearance: 7.6 mL/min (A) (by C-G formula based on SCr of 5.85 mg/dL (H)). Liver Function Tests: Recent Labs  Lab 01/17/20 0615 01/18/20 0412  01/19/20 0415 01/20/20 0327 01/21/20 0410  ALBUMIN 2.2* 1.7* 1.8* 1.7* 1.8*   No results for input(s): LIPASE, AMYLASE in the last 168 hours. No results for input(s): AMMONIA in the last 168 hours. Coagulation profile No results for input(s): INR, PROTIME in the last 168 hours.  CBC: Recent Labs  Lab 01/17/20 0615 01/18/20 0412 01/19/20 0415 01/20/20 0555 01/21/20 0410  WBC 9.1 5.5 6.9 4.9 4.5  NEUTROABS 7.7 4.0 5.4 3.5  3.0  HGB 9.6* 7.5* 7.6* 7.6* 7.9*  HCT 30.9* 23.5* 23.4* 23.6* 24.4*  MCV 91.4 90.4 89.7 90.8 91.4  PLT 170 152 167 185 218   Cardiac Enzymes: No results for input(s): CKTOTAL, CKMB, CKMBINDEX, TROPONINI in the last 168 hours. BNP (last 3 results) No results for input(s): PROBNP in the last 8760 hours. CBG: Recent Labs  Lab 01/19/20 2214 01/20/20 0715 01/20/20 1057 01/20/20 1624 01/21/20 0634  GLUCAP 169* 99 199* 140* 92   D-Dimer: No results for input(s): DDIMER in the last 72 hours. Hgb A1c: No results for input(s): HGBA1C in the last 72 hours. Lipid Profile: No results for input(s): CHOL, HDL, LDLCALC, TRIG, CHOLHDL, LDLDIRECT in the last 72 hours. Thyroid function studies: No results for input(s): TSH, T4TOTAL, T3FREE, THYROIDAB in the last 72 hours.  Invalid input(s): FREET3 Anemia work up: No results for input(s): VITAMINB12, FOLATE, FERRITIN, TIBC, IRON, RETICCTPCT in the last 72 hours. Sepsis Labs: Recent Labs  Lab 01/18/20 0412 01/19/20 0415 01/20/20 0555 01/21/20 0410  WBC 5.5 6.9 4.9 4.5    Microbiology No results found for this or any previous visit (from the past 240 hour(s)).  Procedures and diagnostic studies:  No results found.  Medications:   . amLODipine  5 mg Oral Daily  . Chlorhexidine Gluconate Cloth  6 each Topical Q0600  . feeding supplement (ENSURE ENLIVE)  237 mL Oral BID BM  . heparin  5,000 Units Subcutaneous Q8H  . insulin aspart  0-5 Units Subcutaneous QHS  . insulin aspart  0-9 Units Subcutaneous  TID WC  . insulin glargine  5 Units Subcutaneous Daily  . magnesium oxide  400 mg Oral Daily  . mouth rinse  15 mL Mouth Rinse BID  . polyethylene glycol  17 g Oral BID  . senna  2 tablet Oral BID  . sodium bicarbonate  1,300 mg Oral BID   Continuous Infusions: . sodium chloride Stopped (01/13/20 0735)     LOS: 13 days   Radene Gunning NP  [facility]   How to contact the [facility] Attending or Consulting provider [place] or covering provider during after hours [place], for this patient?  1. Check the care team in [facility] and look for a) attending/consulting TRH provider listed and b) the [facility] team listed 2. Log into [email or web address] and use [place]'s universal password to access. If you do not have the password, please contact the hospital operator. 3. Locate the [facility] provider you are looking for under [facility] and page to a number that you can be directly reached. 4. If you still have difficulty reaching the provider, please page the [facility] (Director on Call) for the Hospitalists listed on amion for assistance.  01/21/2020, 11:24 AM

## 2020-01-21 NOTE — Progress Notes (Signed)
Admit: 01/08/2020 LOS: 75  37F AKI from ATN req CRRT through 4/6 now following for recovery of GFR / HD  Subjective:  [name] SCR/BUN stable, K 3.9 .  UOP not quantified yesterday . Decent PO, no N/V . Still with Temp HD cath L IJ  04/15 0701 - 04/16 0700 In: 620 [P.O.:620] Out: 0   Filed Weights   01/17/20 1130 01/17/20 2101 01/18/20 2100  Weight: 55.8 kg 55.7 kg 61.6 kg    Scheduled Meds: . amLODipine  5 mg Oral Daily  . Chlorhexidine Gluconate Cloth  6 each Topical Q0600  . feeding supplement (ENSURE ENLIVE)  237 mL Oral BID BM  . heparin  5,000 Units Subcutaneous Q8H  . insulin aspart  0-5 Units Subcutaneous QHS  . insulin aspart  0-9 Units Subcutaneous TID WC  . insulin glargine  5 Units Subcutaneous Daily  . mouth rinse  15 mL Mouth Rinse BID  . polyethylene glycol  17 g Oral BID  . senna  2 tablet Oral BID  . sodium bicarbonate  1,300 mg Oral BID   Continuous Infusions: . sodium chloride Stopped (01/13/20 0735)  . magnesium sulfate bolus IVPB     PRN Meds:.sodium chloride, dextrose, ipratropium-albuterol, labetalol  Current Labs: reviewed    Physical Exam:  Blood pressure (!) 138/51, pulse 81, temperature 98.6 F (37 C), temperature source Oral, resp. rate 16, height 5\' 2"  (1.575 m), weight 61.6 kg, SpO2 96 %. NAD, I nchair Regular, no rub CTAB No sig LEE L IJ Temp HD cath bandaged S/nt/nd  A 1. AKI, nl baseline; likely ATN from hypovolemia/hemodynamic; stable low GFR, UOP unclear not beign quantified, not overtly uremic right now 2. Anemia 3. HTN< BP stable 4. Acidosis on NaHCO3 5. DKA 6. LEE 7. DM2 8. COPD 9. Hypomag, repleted today  P . Cont to watch UOP (need this measured) and trending BUN/SCr.   [name] No RRT needed . Might need to get out temp HD cath while we wait, reassess tomorrow . Daily weights, Daily Renal Panel, Strict I/Os, Avoid nephrotoxins (NSAIDs, judicious IV Contrast)    Pearson Grippe MD 01/21/2020, 12:43 PM  Recent Labs  Lab  01/19/20 0415 01/20/20 0327 01/21/20 0410  NA 134* 133* 137  K 3.5 3.6 3.9  CL 98 98 100  CO2 24 22 26   GLUCOSE 100* 96 84  BUN 66* 69* 69*  CREATININE 5.83* 5.82* 5.85*  CALCIUM 6.4* 6.5* 6.6*  PHOS 3.0 3.1 2.7   Recent Labs  Lab 01/19/20 0415 01/20/20 0555 01/21/20 0410  WBC 6.9 4.9 4.5  NEUTROABS 5.4 3.5 3.0  HGB 7.6* 7.6* 7.9*  HCT 23.4* 23.6* 24.4*  MCV 89.7 90.8 91.4  PLT 167 185 218

## 2020-01-21 NOTE — Progress Notes (Signed)
Physical Therapy Treatment Patient Details Name: Cindy Burns MRN: [id number] DOB: 1946-04-03 Today's Date: 01/21/2020    History of Present Illness Pt is a 74 year old woman admitted on 01/08/20 to [facility] and subsequently transferred to [facility] with respiratory failure requring intubation, DKA, UTI, hyperkalemia and renal failure requiring CRRT. Extubated 01/11/20. PMH: COPD, breast cancer, Dm, MDD.    PT Comments    Upon arrival, pt continues to demonstrate stool incontinence and informed PT that she had had a BM. PT assisted with pericare and cleanup. Pt was min guard to stand from EOB and minA to ambulate around her room. She continues to have difficulty with problem solving with RW, requiring minA for RW navigation. During ambulation, pt demonstrated inc work of breathing, SpO2 91-94% on RA. She also continues to present with impaired ability to DF her L ankle. Upon further examination, she reports impaired light touch sensation along L4 dermatome- she states this began this hospital stay. Educated and demonstrated LE exercises and stretches to prevent PF contracture and to inc DF activation of L LE. Pt would continue to benefit from skilled physical therapy services at this time while admitted and after d/c to address the below listed limitations in order to improve overall safety and independence with functional mobility.   Follow Up Recommendations  SNF;Supervision/Assistance - 24 hour     Equipment Recommendations  Rolling walker with 5" wheels;3in1 (PT)    Recommendations for Other Services       Precautions / Restrictions Precautions Precautions: Fall    Mobility  Bed Mobility Overal bed mobility: Needs Assistance Bed Mobility: Supine to Sit Rolling: Min assist         General bed mobility comments: minA due to stool incontinence and clean up  Transfers Overall transfer level: Needs assistance Equipment used: Rolling walker (2 wheeled) Transfers: Sit to/from Stand Sit to  Stand: Min guard;From elevated surface         General transfer comment: min guard for safety, no physical assist needed  Ambulation/Gait Ambulation/Gait assistance: Min assist Gait Distance (Feet): 15 Feet Assistive device: Rolling walker (2 wheeled) Gait Pattern/deviations: Step-through pattern;Decreased stride length;Narrow base of support Gait velocity: dec   General Gait Details: MinA for RW navigation/problem solving. No LOB noted. Distance limited due to pt's fatigue and inc work of breathing. SpO2 91-94% on RA. HR inc from 98 to 108 during ambulation   Stairs             Wheelchair Mobility    Modified Rankin (Stroke Patients Only)       Balance Overall balance assessment: Needs assistance Sitting-balance support: No upper extremity supported;Feet supported Sitting balance-Leahy Scale: Fair     Standing balance support: Bilateral upper extremity supported;During functional activity;No upper extremity supported Standing balance-Leahy Scale: Poor Standing balance comment: reliant on UE support                            Cognition Arousal/Alertness: Awake/alert Behavior During Therapy: WFL for tasks assessed/performed Overall Cognitive Status: No family/caregiver present to determine baseline cognitive functioning Area of Impairment: Awareness;Problem solving;Attention                   Current Attention Level: Sustained     Safety/Judgement: Decreased awareness of safety;Decreased awareness of deficits Awareness: Emergent Problem Solving: Slow processing General Comments: inc time to sequence and problem solve with RW      Exercises General Exercises - Lower  Extremity Gluteal Sets: Strengthening;Both;10 reps;Seated Hip Flexion/Marching: Strengthening;Both;10 reps;Standing(cues to concentrate on L DF) Other Exercises Other Exercises: Seated L gastroc stretch with gait belt     General Comments General comments (skin integrity,  edema, etc.): Pt with continued L DF impairements- PRAFO boot ordered. Sensation testing revealed diminished light touch sensation along L4 dermatome.       Pertinent Vitals/Pain Pain Assessment: Faces Faces Pain Scale: No hurt    Home Living                      Prior Function            PT Goals (current goals can now be found in the care plan section) Acute Rehab PT Goals PT Goal Formulation: With patient Time For Goal Achievement: 01/26/20 Potential to Achieve Goals: Fair Progress towards PT goals: Progressing toward goals    Frequency    Min 3X/week      PT Plan Current plan remains appropriate    Co-evaluation              AM-PAC PT "6 Clicks" Mobility   Outcome Measure  Help needed turning from your back to your side while in a flat bed without using bedrails?: None Help needed moving from lying on your back to sitting on the side of a flat bed without using bedrails?: None Help needed moving to and from a bed to a chair (including a wheelchair)?: A Little Help needed standing up from a chair using your arms (e.g., wheelchair or bedside chair)?: A Little Help needed to walk in hospital room?: A Little Help needed climbing 3-5 steps with a railing? : A Lot 6 Click Score: 19    End of Session Equipment Utilized During Treatment: Gait belt Activity Tolerance: Patient tolerated treatment well Patient left: in chair;with call bell/phone within reach;with chair alarm set Nurse Communication: Mobility status PT Visit Diagnosis: Other abnormalities of gait and mobility (R26.89);Unsteadiness on feet (R26.81);Muscle weakness (generalized) (M62.81)     Time: 2426-8341 PT Time Calculation (min) (ACUTE ONLY): 16 min  Charges:  $Gait Training: 8-22 mins                     [place], SPT Acute Rehab  [phone]   Buryl Bamber 01/21/2020, 3:15 PM

## 2020-01-22 DIAGNOSIS — N179 Acute kidney failure, unspecified: Secondary | ICD-10-CM | POA: Diagnosis not present

## 2020-01-22 DIAGNOSIS — E872 Acidosis: Secondary | ICD-10-CM | POA: Diagnosis not present

## 2020-01-22 DIAGNOSIS — I10 Essential (primary) hypertension: Secondary | ICD-10-CM | POA: Diagnosis not present

## 2020-01-22 LAB — CBC WITH DIFFERENTIAL/PLATELET
Abs Immature Granulocytes: 0.05 10*3/uL (ref 0.00–0.07)
Basophils Absolute: 0 10*3/uL (ref 0.0–0.1)
Basophils Relative: 0 %
Eosinophils Absolute: 0.1 10*3/uL (ref 0.0–0.5)
Eosinophils Relative: 2 %
HCT: 23 % — ABNORMAL LOW (ref 36.0–46.0)
Hemoglobin: 7.4 g/dL — ABNORMAL LOW (ref 12.0–15.0)
Immature Granulocytes: 1 %
Lymphocytes Relative: 19 %
Lymphs Abs: 1 10*3/uL (ref 0.7–4.0)
MCH: 29.4 pg (ref 26.0–34.0)
MCHC: 32.2 g/dL (ref 30.0–36.0)
MCV: 91.3 fL (ref 80.0–100.0)
Monocytes Absolute: 0.4 10*3/uL (ref 0.1–1.0)
Monocytes Relative: 7 %
Neutro Abs: 3.9 10*3/uL (ref 1.7–7.7)
Neutrophils Relative %: 71 %
Platelets: 223 10*3/uL (ref 150–400)
RBC: 2.52 MIL/uL — ABNORMAL LOW (ref 3.87–5.11)
RDW: 16.5 % — ABNORMAL HIGH (ref 11.5–15.5)
WBC: 5.4 10*3/uL (ref 4.0–10.5)
nRBC: 0 % (ref 0.0–0.2)

## 2020-01-22 LAB — MAGNESIUM: Magnesium: 1.5 mg/dL — ABNORMAL LOW (ref 1.7–2.4)

## 2020-01-22 LAB — GLUCOSE, CAPILLARY
Glucose-Capillary: 110 mg/dL — ABNORMAL HIGH (ref 70–99)
Glucose-Capillary: 213 mg/dL — ABNORMAL HIGH (ref 70–99)
Glucose-Capillary: 130 mg/dL — ABNORMAL HIGH (ref 70–99)
Glucose-Capillary: 131 mg/dL — ABNORMAL HIGH (ref 70–99)

## 2020-01-22 LAB — RENAL FUNCTION PANEL
Albumin: 1.7 g/dL — ABNORMAL LOW (ref 3.5–5.0)
Anion gap: 14 (ref 5–15)
BUN: 71 mg/dL — ABNORMAL HIGH (ref 8–23)
CO2: 22 mmol/L (ref 22–32)
Calcium: 6.7 mg/dL — ABNORMAL LOW (ref 8.9–10.3)
Chloride: 99 mmol/L (ref 98–111)
Creatinine, Ser: 6.3 mg/dL — ABNORMAL HIGH (ref 0.44–1.00)
GFR calc Af Amer: 7 mL/min — ABNORMAL LOW (ref >60)
GFR calc non Af Amer: 6 mL/min — ABNORMAL LOW (ref >60)
Glucose, Bld: 111 mg/dL — ABNORMAL HIGH (ref 70–99)
Phosphorus: 3.3 mg/dL (ref 2.5–4.6)
Potassium: 3.2 mmol/L — ABNORMAL LOW (ref 3.5–5.1)
Sodium: 135 mmol/L (ref 135–145)

## 2020-01-22 MED ORDER — MAGNESIUM SULFATE 2 GM/50ML IV SOLN
2.0000 g | Freq: Once | INTRAVENOUS | Status: AC
  Administered 2020-01-22: 2 g via INTRAVENOUS
  Filled 2020-01-22: qty 50

## 2020-01-22 MED ORDER — POTASSIUM CHLORIDE CRYS ER 20 MEQ PO TBCR
20.0000 meq | EXTENDED_RELEASE_TABLET | Freq: Two times a day (BID) | ORAL | Status: AC
  Administered 2020-01-22 – 2020-01-23 (×3): 20 meq via ORAL
  Filled 2020-01-22 (×3): qty 1

## 2020-01-22 NOTE — Progress Notes (Signed)
Bladder scan done ,200cc residual urine detected.

## 2020-01-22 NOTE — Plan of Care (Signed)
  Problem: Education: Goal: Knowledge of General Education information will improve Description Including pain rating scale, medication(s)/side effects and non-pharmacologic comfort measures Outcome: Progressing   

## 2020-01-22 NOTE — H&P (Signed)
Chief Complaint   No chief complaint on file.   HPI   Consult requested by: Dr Cherylann Ratel, [facility] Reason for consult: Left drop foot  HPI: Cindy Burns is a 74 y.o. female with history breast cancer, COPD, DM, MDD admitted 01/08/2020 with acute renal failure requiring CRRT, DKA, acute resp failure with extubation 01/11/2020 who was working with therapy and they noticed left foot drop. An MRI of her lumbar spine was subsequently ordered. NSY was consulted for recommendations. I came by to evaluate the patient. She endorses weakness in left foot for the past several days. Does endorse left ankle pain. She denies LBP, radicular symptoms or N/T in legs.   Patient Active Problem List   Diagnosis Date Noted  . Hypocalcemia 01/19/2020  . Metabolic acidosis 66/29/4765  . Acute respiratory failure with hypoxia ([place]) 01/19/2020  . UTI (urinary tract infection) 01/19/2020  . Encounter for central line placement   . Pain and swelling of ankle, left   . Pressure injury of skin 01/09/2020  . Renal failure 01/08/2020  . AKI (acute kidney injury) ([place])   . Hepatomegaly 07/30/2019  . Right carotid bruit 07/30/2019  . SIADH (syndrome of inappropriate ADH production) ([place]) 04/06/2019  . B12 deficiency 04/06/2019  . Balance problem 03/18/2019  . Ventral hernia 04/03/2017  . Hypertension 02/28/2017  . Chronic insomnia 10/26/2013  . Diabetic autonomic neuropathy ([place]) 08/12/2013  . PULMONARY NODULE 09/27/2009  . ADENOCARCINOMA, BREAST 02/28/2009  . Normocytic anemia 06/24/2008  . Moderate COPD (chronic obstructive pulmonary disease) ([place]) 03/14/2008  . Major depressive disorder, recurrent episode, moderate ([place]) 08/11/2007  . Diabetes mellitus due to underlying condition, controlled, with neurologic complication ([place]) 46/50/3546  . HYPERLIPIDEMIA, MIXED 04/30/2007  . CIGARETTE SMOKER 04/22/2007    PMH: Past Medical History:  Diagnosis Date  . AKI (acute kidney injury) ([place]) 01/2020  .  Cancer [facility]) 2005   Breast Cancer  . COPD (chronic obstructive pulmonary disease) ([place])   . Depression   . Diabetes mellitus without complication ([place])   . Hyperlipidemia     PSH: Past Surgical History:  Procedure Laterality Date  . BREAST SURGERY  2005  . CATARACT EXTRACTION    . CHOLECYSTECTOMY  2013    Medications Prior to Admission  Medication Sig Dispense Refill Last Dose  . sitaGLIPtin (JANUVIA) 100 MG tablet Take 100 mg by mouth daily.   Not Taking at Unknown time    SH: Social History   Tobacco Use  . Smoking status: Current Every Day Smoker    Packs/day: 1.00    Years: 40.00    Pack years: 40.00    Types: Cigarettes  . Smokeless tobacco: Never Used  Substance Use Topics  . Alcohol use: No  . Drug use: No    MEDS: Prior to Admission medications   Medication Sig Start Date End Date Taking? Authorizing Provider  sitaGLIPtin (JANUVIA) 100 MG tablet Take 100 mg by mouth daily.    [provider]    ALLERGY: No Known Allergies  Social History   Tobacco Use  . Smoking status: Current Every Day Smoker    Packs/day: 1.00    Years: 40.00    Pack years: 40.00    Types: Cigarettes  . Smokeless tobacco: Never Used  Substance Use Topics  . Alcohol use: No     Family History  Problem Relation Age of Onset  . Arthritis Mother   . Alzheimer's disease Mother   . Dementia Father   .  COPD Brother   . COPD Brother      ROS   Review of Systems  Musculoskeletal: Positive for joint pain (left ankle). Negative for back pain and myalgias.  Neurological: Positive for focal weakness (left foot). Negative for dizziness, tingling, tremors, sensory change, speech change and headaches.    Exam   Vitals:   01/22/20 0442 01/22/20 1120  BP: (!) 120/53 (!) 149/68  Pulse: 83 81  Resp: 18 16  Temp: 98.2 F (36.8 C) 98.9 F (37.2 C)  SpO2: 94% 96%   General appearance: resting comfortably, NAD Eyes: No scleral injection Cardiovascular: Regular rate  and rhythm without murmurs, rubs, gallops. No edema or variciosities. Distal pulses normal. Pulmonary: Effort normal, non-labored breathing Musculoskeletal:     Muscle tone lower extremities: Normal    Motor exam: Lower Extremity IP Quad PF DF EHL  Right 5/5 5/5 5/5 5/5 5/5  Left 5/5 5/5 4/5 2/5 2/5   Neurological Mental Status:    - Patient is awake, alert, oriented to person, place, month, year, and situation    - Patient is able to give a clear and coherent history.    - No signs of aphasia or neglect Cranial Nerves    - II: Visual Fields are full. PERRL    - III/IV/VI: EOMI without ptosis or diploplia.     - V: Facial sensation is grossly normal    - VII: Facial movement is symmetric.     - VIII: hearing is intact to voice    - X: Uvula elevates symmetrically    - XI: Shoulder shrug is symmetric.    - XII: tongue is midline without atrophy or fasciculations.  Sensory: Sensation grossly intact to LT  Results - Imaging/Labs   Results for orders placed or performed during the hospital encounter of 01/08/20 (from the past 48 hour(s))  CBC with Differential/Platelet     Status: Abnormal   Collection Time: 01/21/20  4:10 AM  Result Value Ref Range   WBC 4.5 4.0 - 10.5 K/uL   RBC 2.67 (L) 3.87 - 5.11 MIL/uL   Hemoglobin 7.9 (L) 12.0 - 15.0 g/dL   HCT 24.4 (L) 36.0 - 46.0 %   MCV 91.4 80.0 - 100.0 fL   MCH 29.6 26.0 - 34.0 pg   MCHC 32.4 30.0 - 36.0 g/dL   RDW 16.2 (H) 11.5 - 15.5 %   Platelets 218 150 - 400 K/uL   nRBC 0.0 0.0 - 0.2 %   Neutrophils Relative % 66 %   Neutro Abs 3.0 1.7 - 7.7 K/uL   Lymphocytes Relative 23 %   Lymphs Abs 1.0 0.7 - 4.0 K/uL   Monocytes Relative 8 %   Monocytes Absolute 0.4 0.1 - 1.0 K/uL   Eosinophils Relative 2 %   Eosinophils Absolute 0.1 0.0 - 0.5 K/uL   Basophils Relative 0 %   Basophils Absolute 0.0 0.0 - 0.1 K/uL   Immature Granulocytes 1 %   Abs Immature Granulocytes 0.06 0.00 - 0.07 K/uL    Comment: Performed at [facility], [address], [place], [place] [address]  Renal function panel     Status: Abnormal   Collection Time: 01/21/20  4:10 AM  Result Value Ref Range   Sodium 137 135 - 145 mmol/L   Potassium 3.9 3.5 - 5.1 mmol/L   Chloride 100 98 - 111 mmol/L   CO2 26 22 - 32 mmol/L   Glucose, Bld 84 70 - 99 mg/dL  Comment: Glucose reference range applies only to samples taken after fasting for at least 8 hours.   BUN 69 (H) 8 - 23 mg/dL   Creatinine, Ser 5.85 (H) 0.44 - 1.00 mg/dL   Calcium 6.6 (L) 8.9 - 10.3 mg/dL   Phosphorus 2.7 2.5 - 4.6 mg/dL   Albumin 1.8 (L) 3.5 - 5.0 g/dL   GFR calc non Af Amer 7 (L) >60 mL/min   GFR calc Af Amer 8 (L) >60 mL/min   Anion gap 11 5 - 15    Comment: Performed at [facility] [address], [place], [place] [address]  Magnesium     Status: Abnormal   Collection Time: 01/21/20  4:10 AM  Result Value Ref Range   Magnesium 1.1 (L) 1.7 - 2.4 mg/dL    Comment: Performed at [facility] [address] [place], [place] [address]  Glucose, capillary     Status: None   Collection Time: 01/21/20  6:34 AM  Result Value Ref Range   Glucose-Capillary 92 70 - 99 mg/dL    Comment: Glucose reference range applies only to samples taken after fasting for at least 8 hours.  Glucose, capillary     Status: Abnormal   Collection Time: 01/21/20 11:48 AM  Result Value Ref Range   Glucose-Capillary 178 (H) 70 - 99 mg/dL    Comment: Glucose reference range applies only to samples taken after fasting for at least 8 hours.  Glucose, capillary     Status: Abnormal   Collection Time: 01/21/20  4:35 PM  Result Value Ref Range   Glucose-Capillary 118 (H) 70 - 99 mg/dL    Comment: Glucose reference range applies only to samples taken after fasting for at least 8 hours.  Glucose, capillary     Status: Abnormal   Collection Time: 01/21/20  9:55 PM  Result Value Ref Range   Glucose-Capillary 114 (H) 70 - 99 mg/dL    Comment: Glucose reference range applies only  to samples taken after fasting for at least 8 hours.  Glucose, capillary     Status: Abnormal   Collection Time: 01/22/20  6:58 AM  Result Value Ref Range   Glucose-Capillary 110 (H) 70 - 99 mg/dL    Comment: Glucose reference range applies only to samples taken after fasting for at least 8 hours.  CBC with Differential/Platelet     Status: Abnormal   Collection Time: 01/22/20  8:06 AM  Result Value Ref Range   WBC 5.4 4.0 - 10.5 K/uL   RBC 2.52 (L) 3.87 - 5.11 MIL/uL   Hemoglobin 7.4 (L) 12.0 - 15.0 g/dL   HCT 23.0 (L) 36.0 - 46.0 %   MCV 91.3 80.0 - 100.0 fL   MCH 29.4 26.0 - 34.0 pg   MCHC 32.2 30.0 - 36.0 g/dL   RDW 16.5 (H) 11.5 - 15.5 %   Platelets 223 150 - 400 K/uL   nRBC 0.0 0.0 - 0.2 %   Neutrophils Relative % 71 %   Neutro Abs 3.9 1.7 - 7.7 K/uL   Lymphocytes Relative 19 %   Lymphs Abs 1.0 0.7 - 4.0 K/uL   Monocytes Relative 7 %   Monocytes Absolute 0.4 0.1 - 1.0 K/uL   Eosinophils Relative 2 %   Eosinophils Absolute 0.1 0.0 - 0.5 K/uL   Basophils Relative 0 %   Basophils Absolute 0.0 0.0 - 0.1 K/uL   Immature Granulocytes 1 %   Abs Immature Granulocytes 0.05 0.00 -  0.07 K/uL    Comment: Performed at [facility], [place] [address] [place], [place] [address]  Magnesium     Status: Abnormal   Collection Time: 01/22/20  8:06 AM  Result Value Ref Range   Magnesium 1.5 (L) 1.7 - 2.4 mg/dL    Comment: Performed at [facility] [address], [place], [place] [address]  Renal function panel     Status: Abnormal   Collection Time: 01/22/20  8:06 AM  Result Value Ref Range   Sodium 135 135 - 145 mmol/L   Potassium 3.2 (L) 3.5 - 5.1 mmol/L   Chloride 99 98 - 111 mmol/L   CO2 22 22 - 32 mmol/L   Glucose, Bld 111 (H) 70 - 99 mg/dL    Comment: Glucose reference range applies only to samples taken after fasting for at least 8 hours.   BUN 71 (H) 8 - 23 mg/dL   Creatinine, Ser 6.30 (H) 0.44 - 1.00 mg/dL   Calcium 6.7 (L) 8.9 - 10.3 mg/dL   Phosphorus 3.3 2.5  - 4.6 mg/dL   Albumin 1.7 (L) 3.5 - 5.0 g/dL   GFR calc non Af Amer 6 (L) >60 mL/min   GFR calc Af Amer 7 (L) >60 mL/min   Anion gap 14 5 - 15    Comment: Performed at [facility] [address] [place], [place] [address]  Glucose, capillary     Status: Abnormal   Collection Time: 01/22/20 11:19 AM  Result Value Ref Range   Glucose-Capillary 213 (H) 70 - 99 mg/dL    Comment: Glucose reference range applies only to samples taken after fasting for at least 8 hours.    MR LUMBAR SPINE WO CONTRAST  Result Date: 01/21/2020 CLINICAL DATA:  Lumbar radiculopathy. EXAM: MRI LUMBAR SPINE WITHOUT CONTRAST TECHNIQUE: Multiplanar, multisequence MR imaging of the lumbar spine was performed. No intravenous contrast was administered. COMPARISON:  None. FINDINGS: Segmentation: 5 non rib-bearing lumbar type vertebral bodies are present. The lowest fully formed vertebral body is L5. Alignment: Limits anatomic. Some straightening of normal cervical lordosis is noted. Vertebrae: Edematous and fatty endplate marrow changes are present at L2-3. Mild degenerative changes are noted at L1-2. Marrow signal and vertebral body heights are otherwise normal. Conus medullaris and cauda equina: Conus extends to the L2 level. Conus and cauda equina appear normal. Paraspinal and other soft tissues: Limited imaging the abdomen is unremarkable. There is no significant adenopathy. No solid organ lesions are present. Disc levels: L1-2: Negative. L2-3: Negative. L3-4: Mild disc bulging is present without significant stenosis. L4-5: A leftward disc protrusion is present. Annular tear is noted. No compressive stenosis is evident. L5-S1: A rightward disc protrusion extends into the foramen. Moderate facet hypertrophy is worse on the right. IMPRESSION: 1. Mild disc bulging at L3-4. 2. Leftward disc protrusion and annular tear. This may impact the exiting left L4 nerve root. 3. Rightward disc protrusion and annular tear at L5-S1 without  significant stenosis. 4. Moderate facet hypertrophy at L5-S1 is worse on the right. Electronically Signed   By: San Morelle M.D.   On: 01/21/2020 19:05   Impression/Plan   74 y.o. female currently admitted for acute renal failure requiring CRRT, DKA, acute resp failure with extubation 01/11/2020 who was noted to have left foot drop when working with PT. A Lumbar spine MRI was ordered. I have reviewed the imaging with attending, Dr Kathyrn Sheriff. She has diffuse multilevel degenerative changes without neural compression that would  account for her symptoms. She does have pain in her left ankle with palpation and could have some sort of tendonopathy. Would rec EMG/NCS on outpatient basis.   Please call for any concerns.  Ferne Reus, PA-C [facility] [employer]

## 2020-01-22 NOTE — Progress Notes (Signed)
Cindy Burns  PROGRESS NOTE    Cindy Burns  JHE:[id number] DOB: [birth year] DOA: 01/08/2020 PCP: [name], MD   Brief Narrative:   Cindy Burns is an 74 y.o. female Cindy Burns an 74 y.o.femalewith a past medical history significant for breast cancer, COPD, tobacco use, diabetes, MDD was admitted April 3 with acute renal failure with hyperkalemia, anion gap metabolic acidosis secondary to DKA, acute respiratory failure requiring intubation extubated April 6, acute kidney injury requiringCRRT.  4/17: Scr is up today. Defer to nephro. Appreciate assistance. Needs Mg2+/K+ today. MRI results noted. Spoke with neurosurgery. They will assess her. Appreciate assistance.    Assessment & Plan:   Principal Problem:   Renal failure Active Problems:   CIGARETTE SMOKER   Hypertension   Pressure injury of skin   Encounter for central line placement   Pain and swelling of ankle, left   Hypocalcemia   Metabolic acidosis   Acute respiratory failure with hypoxia (H[facility])   UTI (urinary tract infection)  Acute renal failure secondary to severe volume depletion in the setting of DKA/UTI. Metabolic acidosis     - SCr up to 6.3 today     - nephrology following, defer to them, appreciate assistance   Acute respiratory failure with hypoxia. COPD Tobacco abuse      - Initially patient required intubation.      - She was extubated on April 6.      - Oxygen saturation levels been greater than 90% on room air. Ambulated in hall and oxygen saturation      - PRN nebs, IS, mobilize, monitor     - tobacco cessation discussed  Hypokelmia Hypocalcemia hypomagnesemia     - replete, monitor  UTI     - Pansensitive E. coli noted on culture.      - Blood cultures negative.     - s/p 7-day rocephin course  Normocytic anemia.      - Hemoglobin stable last several days, monitor  Diabetes type 2.      - Patient in DKA on admission.      - Home medications include Januvia.      -  Hemoglobin A1c 8.5.     - lantus 5 units, SSI, monitor  Hypertension     - BP is ok, continue norvasc  Pain and swelling of the left ankle.  Left foot drop and decrease sensation of L4 dermatome     - X-rays were negative.     - PT and OT     - MRI results noted; have asked neurosurgery to eval, appreciate their assistance  DVT prophylaxis: heparin Code Status: FULL   Status is: Inpatient  Remains inpatient appropriate because:Inpatient level of care appropriate due to severity of illness   Dispo: The patient is from: Home              Anticipated d/c is to: Home              Anticipated d/c date is: 2 days              Patient currently is not medically stable to d/c.  Consultants:   Nephrology  Neurosurgery  ROS:  Denies CP, N, V . Remainder 10-pt ROS is negative for all not previously mentioned.  Subjective: "Can I go today?"  Objective: Vitals:   01/21/20 1723 01/21/20 2057 01/22/20 0442 01/22/20 1120  BP: 137/65 (!) 130/57 (!) 120/53 (!) 149/68  Pulse: 82 92 83 81  Resp: 18 17 18 16   Temp: 98.2 F (36.8 C) 98.4 F (36.9 C) 98.2 F (36.8 C) 98.9 F (37.2 C)  TempSrc: Oral Oral Oral Oral  SpO2: 96% 97% 94% 96%  Weight:      Height:        Intake/Output Summary (Last 24 hours) at 01/22/2020 1605 Last data filed at 01/22/2020 0600 Gross per 24 hour  Intake 320 ml  Output 775 ml  Net -455 ml   Filed Weights   01/17/20 1130 01/17/20 2101 01/18/20 2100  Weight: 55.8 kg 55.7 kg 61.6 kg    Examination:  General: 74 y.o. female resting in bed in NAD Cardiovascular: RRR, +S1, S2, no m/g/r Respiratory: CTABL, no w/r/r, normal WOB GI: BS+, NDNT, soft MSK: No e/c/c Neuro: alert to name, follows commands Psyc: Appropriate interaction and affect, calm/cooperative   Data Reviewed: I have personally reviewed following labs and imaging studies.  CBC: Recent Labs  Lab 01/18/20 0412 01/19/20 0415 01/20/20 0555 01/21/20 0410 01/22/20 0806  WBC 5.5  6.9 4.9 4.5 5.4  NEUTROABS 4.0 5.4 3.5 3.0 3.9  HGB 7.5* 7.6* 7.6* 7.9* 7.4*  HCT 23.5* 23.4* 23.6* 24.4* 23.0*  MCV 90.4 89.7 90.8 91.4 91.3  PLT 152 167 185 218 413   Basic Metabolic Panel: Recent Labs  Lab 01/18/20 0412 01/19/20 0415 01/20/20 0327 01/21/20 0410 01/22/20 0806  NA 136 134* 133* 137 135  K 3.1* 3.5 3.6 3.9 3.2*  CL 104 98 98 100 99  CO2 20* 24 22 26 22   GLUCOSE 119* 100* 96 84 111*  BUN 62* 66* 69* 69* 71*  CREATININE 5.77* 5.83* 5.82* 5.85* 6.30*  CALCIUM 6.5* 6.4* 6.5* 6.6* 6.7*  MG  --   --  1.1* 1.1* 1.5*  PHOS 3.9 3.0 3.1 2.7 3.3   GFR: Estimated Creatinine Clearance: 7.1 mL/min (A) (by C-G formula based on SCr of 6.3 mg/dL (H)). Liver Function Tests: Recent Labs  Lab 01/18/20 0412 01/19/20 0415 01/20/20 0327 01/21/20 0410 01/22/20 0806  ALBUMIN 1.7* 1.8* 1.7* 1.8* 1.7*   No results for input(s): LIPASE, AMYLASE in the last 168 hours. No results for input(s): AMMONIA in the last 168 hours. Coagulation Profile: No results for input(s): INR, PROTIME in the last 168 hours. Cardiac Enzymes: No results for input(s): CKTOTAL, CKMB, CKMBINDEX, TROPONINI in the last 168 hours. BNP (last 3 results) No results for input(s): PROBNP in the last 8760 hours. HbA1C: No results for input(s): HGBA1C in the last 72 hours. CBG: Recent Labs  Lab 01/21/20 1148 01/21/20 1635 01/21/20 2155 01/22/20 0658 01/22/20 1119  GLUCAP 178* 118* 114* 110* 213*   Lipid Profile: No results for input(s): CHOL, HDL, LDLCALC, TRIG, CHOLHDL, LDLDIRECT in the last 72 hours. Thyroid Function Tests: No results for input(s): TSH, T4TOTAL, FREET4, T3FREE, THYROIDAB in the last 72 hours. Anemia Panel: No results for input(s): VITAMINB12, FOLATE, FERRITIN, TIBC, IRON, RETICCTPCT in the last 72 hours. Sepsis Labs: No results for input(s): PROCALCITON, LATICACIDVEN in the last 168 hours.  No results found for this or any previous visit (from the past 240 hour(s)).     Radiology Studies: MR LUMBAR SPINE WO CONTRAST  Result Date: 01/21/2020 CLINICAL DATA:  Lumbar radiculopathy. EXAM: MRI LUMBAR SPINE WITHOUT CONTRAST TECHNIQUE: Multiplanar, multisequence MR imaging of the lumbar spine was performed. No intravenous contrast was administered. COMPARISON:  None. FINDINGS: Segmentation: 5 non rib-bearing lumbar type vertebral bodies are present. The lowest fully formed vertebral body is L5. Alignment: Limits anatomic. Some  straightening of normal cervical lordosis is noted. Vertebrae: Edematous and fatty endplate marrow changes are present at L2-3. Mild degenerative changes are noted at L1-2. Marrow signal and vertebral body heights are otherwise normal. Conus medullaris and cauda equina: Conus extends to the L2 level. Conus and cauda equina appear normal. Paraspinal and other soft tissues: Limited imaging the abdomen is unremarkable. There is no significant adenopathy. No solid organ lesions are present. Disc levels: L1-2: Negative. L2-3: Negative. L3-4: Mild disc bulging is present without significant stenosis. L4-5: A leftward disc protrusion is present. Annular tear is noted. No compressive stenosis is evident. L5-S1: A rightward disc protrusion extends into the foramen. Moderate facet hypertrophy is worse on the right. IMPRESSION: 1. Mild disc bulging at L3-4. 2. Leftward disc protrusion and annular tear. This may impact the exiting left L4 nerve root. 3. Rightward disc protrusion and annular tear at L5-S1 without significant stenosis. 4. Moderate facet hypertrophy at L5-S1 is worse on the right. Electronically Signed   By: San Morelle M.D.   On: 01/21/2020 19:05     Scheduled Meds: . amLODipine  5 mg Oral Daily  . Chlorhexidine Gluconate Cloth  6 each Topical Q0600  . feeding supplement (ENSURE ENLIVE)  237 mL Oral BID BM  . heparin  5,000 Units Subcutaneous Q8H  . insulin aspart  0-5 Units Subcutaneous QHS  . insulin aspart  0-9 Units Subcutaneous TID  WC  . insulin glargine  5 Units Subcutaneous Daily  . mouth rinse  15 mL Mouth Rinse BID  . polyethylene glycol  17 g Oral BID  . senna  2 tablet Oral BID  . sodium bicarbonate  1,300 mg Oral BID   Continuous Infusions: . sodium chloride Stopped (01/13/20 0735)     LOS: 14 days    Time spent: 25 minutes spent in the coordination of care today.    Jonnie Finner, DO [facility]  If 7PM-7AM, please contact night-coverage [email or web address] 01/22/2020, 4:05 PM

## 2020-01-22 NOTE — Progress Notes (Signed)
Admit: 01/08/2020 LOS: 74  59F AKI from ATN req CRRT through 4/6 now following for recovery of GFR / HD  Subjective:  [name] SCr up to 6.3 from 5.9; K and HCo3 ok . Req I&O cath for high bladder scan, UPOP 765mL . No n/v, good PO  04/16 0701 - 04/17 0700 In: 1327 [P.O.:1277; IV Piggyback:50] Out: 671 [Urine:775]  Filed Weights   01/17/20 1130 01/17/20 2101 01/18/20 2100  Weight: 55.8 kg 55.7 kg 61.6 kg    Scheduled Meds: . amLODipine  5 mg Oral Daily  . Chlorhexidine Gluconate Cloth  6 each Topical Q0600  . feeding supplement (ENSURE ENLIVE)  237 mL Oral BID BM  . heparin  5,000 Units Subcutaneous Q8H  . insulin aspart  0-5 Units Subcutaneous QHS  . insulin aspart  0-9 Units Subcutaneous TID WC  . insulin glargine  5 Units Subcutaneous Daily  . mouth rinse  15 mL Mouth Rinse BID  . polyethylene glycol  17 g Oral BID  . senna  2 tablet Oral BID  . sodium bicarbonate  1,300 mg Oral BID   Continuous Infusions: . sodium chloride Stopped (01/13/20 0735)   PRN Meds:.sodium chloride, dextrose, ipratropium-albuterol, labetalol  Current Labs: reviewed    Physical Exam:  Blood pressure (!) 120/53, pulse 83, temperature 98.2 F (36.8 C), temperature source Oral, resp. rate 18, height 5\' 2"  (1.575 m), weight 61.6 kg, SpO2 94 %. NAD, I nchair Regular, no rub CTAB No sig LEE L IJ Temp HD cath bandaged S/nt/nd  A 1. AKI, nl baseline; likely ATN from hypovolemia/hemodynamic; stable low GFR, UOP unclear not beign fully quantified, not overtly uremic right now 2. Anemia 3. HTN< BP stable; amlodipine 4. Acidosis on NaHCO3 5. DKA 6. LEE 7. DM2 8. COPD 9. Hypomag  P . Cont to watch UOP; q8h bladder scan and I&O as needed . No RRT needed, no overt uremia . Reassess for Temp HD cath removal tomorrow . Daily weights, Daily Renal Panel, Strict I/Os, Avoid nephrotoxins (NSAIDs, judicious IV Contrast)    Pearson Grippe MD 01/22/2020, 8:24 AM  Recent Labs  Lab 01/19/20 0415  01/20/20 0327 01/21/20 0410  NA 134* 133* 137  K 3.5 3.6 3.9  CL 98 98 100  CO2 24 22 26   GLUCOSE 100* 96 84  BUN 66* 69* 69*  CREATININE 5.83* 5.82* 5.85*  CALCIUM 6.4* 6.5* 6.6*  PHOS 3.0 3.1 2.7   Recent Labs  Lab 01/20/20 0555 01/21/20 0410 01/22/20 0806  WBC 4.9 4.5 5.4  NEUTROABS 3.5 3.0 3.9  HGB 7.6* 7.9* 7.4*  HCT 23.6* 24.4* 23.0*  MCV 90.8 91.4 91.3  PLT 185 218 223

## 2020-01-22 NOTE — Progress Notes (Signed)
Bladder scan performed on patient this am revealed greater than 738 ml in bladder. [place], NP was notified via Amion and ordered  In and Out cath. Output from in and out was 775 ml and patient tolerated procedure well. Will continue to monitor.   Raja Caputi,RN.

## 2020-01-23 DIAGNOSIS — I10 Essential (primary) hypertension: Secondary | ICD-10-CM | POA: Diagnosis not present

## 2020-01-23 DIAGNOSIS — E872 Acidosis: Secondary | ICD-10-CM | POA: Diagnosis not present

## 2020-01-23 DIAGNOSIS — N179 Acute kidney failure, unspecified: Secondary | ICD-10-CM | POA: Diagnosis not present

## 2020-01-23 LAB — CBC WITH DIFFERENTIAL/PLATELET
Abs Immature Granulocytes: 0.08 10*3/uL — ABNORMAL HIGH (ref 0.00–0.07)
Basophils Absolute: 0 10*3/uL (ref 0.0–0.1)
Basophils Relative: 1 %
Eosinophils Absolute: 0.1 10*3/uL (ref 0.0–0.5)
Eosinophils Relative: 3 %
HCT: 23.5 % — ABNORMAL LOW (ref 36.0–46.0)
Hemoglobin: 7.5 g/dL — ABNORMAL LOW (ref 12.0–15.0)
Immature Granulocytes: 2 %
Lymphocytes Relative: 26 %
Lymphs Abs: 0.9 10*3/uL (ref 0.7–4.0)
MCH: 29.2 pg (ref 26.0–34.0)
MCHC: 31.9 g/dL (ref 30.0–36.0)
MCV: 91.4 fL (ref 80.0–100.0)
Monocytes Absolute: 0.3 10*3/uL (ref 0.1–1.0)
Monocytes Relative: 9 %
Neutro Abs: 2.1 10*3/uL (ref 1.7–7.7)
Neutrophils Relative %: 59 %
Platelets: 220 10*3/uL (ref 150–400)
RBC: 2.57 MIL/uL — ABNORMAL LOW (ref 3.87–5.11)
RDW: 16.5 % — ABNORMAL HIGH (ref 11.5–15.5)
WBC: 3.5 10*3/uL — ABNORMAL LOW (ref 4.0–10.5)
nRBC: 0 % (ref 0.0–0.2)

## 2020-01-23 LAB — RENAL FUNCTION PANEL
Albumin: 1.7 g/dL — ABNORMAL LOW (ref 3.5–5.0)
Anion gap: 12 (ref 5–15)
BUN: 72 mg/dL — ABNORMAL HIGH (ref 8–23)
CO2: 24 mmol/L (ref 22–32)
Calcium: 7 mg/dL — ABNORMAL LOW (ref 8.9–10.3)
Chloride: 101 mmol/L (ref 98–111)
Creatinine, Ser: 6.53 mg/dL — ABNORMAL HIGH (ref 0.44–1.00)
GFR calc Af Amer: 7 mL/min — ABNORMAL LOW (ref >60)
GFR calc non Af Amer: 6 mL/min — ABNORMAL LOW (ref >60)
Glucose, Bld: 77 mg/dL (ref 70–99)
Phosphorus: 3.9 mg/dL (ref 2.5–4.6)
Potassium: 3.5 mmol/L (ref 3.5–5.1)
Sodium: 137 mmol/L (ref 135–145)

## 2020-01-23 LAB — MAGNESIUM: Magnesium: 2.1 mg/dL (ref 1.7–2.4)

## 2020-01-23 LAB — GLUCOSE, CAPILLARY
Glucose-Capillary: 89 mg/dL (ref 70–99)
Glucose-Capillary: 209 mg/dL — ABNORMAL HIGH (ref 70–99)

## 2020-01-23 NOTE — Progress Notes (Signed)
[name]  PROGRESS NOTE    Cindy Burns  QQI:[id number] DOB: [birth year] DOA: 01/08/2020 PCP: [name], MD   Brief Narrative:   Cindy Burns an 74 y.o.femaleMary L Burns an 74 y.o.femalewith a past medical history significant for breast cancer, COPD, tobacco use, diabetes, MDD was admitted April 3 with acute renal failure with hyperkalemia, anion gap metabolic acidosis secondary to DKA, acute respiratory failure requiring intubation extubated April 6, acute kidney injury requiringCRRT.  4/18: Scr still going up, but she's not uremic. She's stable. Continuing to follow with nephro. Neurosurgery has seen. Appreciate assistance. Will likely need outpt EMG. She denies complaints this AM.    Assessment & Plan:   Principal Problem:   Renal failure Active Problems:   CIGARETTE SMOKER   Hypertension   Pressure injury of skin   Encounter for central line placement   Pain and swelling of ankle, left   Hypocalcemia   Metabolic acidosis   Acute respiratory failure with hypoxia (HCC)   UTI (urinary tract infection)  Acute renal failure secondary to severe volume depletion in the setting of DKA/UTI. Metabolic acidosis     - 9/41: Scr is up to 6.53 today. She is not displaying signs of uremia     - nephrology following, defer to them, appreciate assistance   Acute respiratory failure with hypoxia. COPD Tobacco abuse      - Initially patient required intubation.      - She was extubated on April 6.      - Oxygen saturation levels been greater than 90% on room air. Ambulated in hall and oxygen saturation      - PRN nebs, IS, mobilize, monitor     - tobacco cessation discussed  Hypokelmia Hypocalcemia hypomagnesemia     - replete, monitor     - 4/18: lytes look good today; corrected Ca2+ is 8.8  UTI     - Pansensitive E. coli noted on culture.      - Blood cultures negative.     - s/p 7-day rocephin course  Normocytic anemia.      - Hemoglobin stable last  several days, monitor  Diabetes type 2.      - Patient in DKA on admission.      - Home medications include Januvia.      - Hemoglobin A1c 8.5.     - lantus 5 units, SSI, monitor  Hypertension     - BP is ok, continue norvasc     - 4/18: BP looks good today, continue  Pain and swelling of the left ankle.  Left foot drop and decrease sensation of L4 dermatome     - X-rays were negative.     - PT and OT     - MRI results noted; have asked neurosurgery to eval, appreciate their assistance     - 4/18: Neurosurg has seen; appreciate assistance. No immediate intervention. Will likely need outpt EMG/follow up with neurology  DVT prophylaxis: heparin Code Status: FULL   Status is: Inpatient  Remains inpatient appropriate because:Inpatient level of care appropriate due to severity of illness   Dispo: The patient is from: Home              Anticipated d/c is to: Home              Anticipated d/c date is: 3 days              Patient currently is not medically stable to d/c.  Consultants:   Nephrology  Neurosurgery   ROS:  Denies ab pain, N, V, CP . Remainder 10-pt ROS is negative for all not previously mentioned.  Subjective: "I think it's non-existent now."  Objective: Vitals:   01/22/20 1646 01/22/20 2114 01/23/20 0534 01/23/20 0949  BP: (!) 123/54 (!) 131/56 (!) 134/53 (!) 128/58  Pulse: 80 79 73 71  Resp: 17 18 18 18   Temp: 99.1 F (37.3 C) 98.6 F (37 C) 97.8 F (36.6 C) 98 F (36.7 C)  TempSrc: Oral Oral Oral Oral  SpO2: 96% 95% 96% 98%  Weight:  56.7 kg    Height:        Intake/Output Summary (Last 24 hours) at 01/23/2020 1146 Last data filed at 01/23/2020 0600 Gross per 24 hour  Intake 237 ml  Output 800 ml  Net -563 ml   Filed Weights   01/17/20 2101 01/18/20 2100 01/22/20 2114  Weight: 55.7 kg 61.6 kg 56.7 kg    Examination:  General: 74 y.o. female resting in bed in NAD Cardiovascular: RRR, +S1, S2, no m/g/r, equal pulses  throughout Respiratory: CTABL, no w/r/r, normal WOB GI: BS+, NDNT, no masses noted, no organomegaly noted MSK: No e/c/c Neuro: alert to name, follows commands Psyc: Appropriate interaction and affect, calm/cooperative   Data Reviewed: I have personally reviewed following labs and imaging studies.  CBC: Recent Labs  Lab 01/19/20 0415 01/20/20 0555 01/21/20 0410 01/22/20 0806 01/23/20 0534  WBC 6.9 4.9 4.5 5.4 3.5*  NEUTROABS 5.4 3.5 3.0 3.9 2.1  HGB 7.6* 7.6* 7.9* 7.4* 7.5*  HCT 23.4* 23.6* 24.4* 23.0* 23.5*  MCV 89.7 90.8 91.4 91.3 91.4  PLT 167 185 218 223 782   Basic Metabolic Panel: Recent Labs  Lab 01/19/20 0415 01/20/20 0327 01/21/20 0410 01/22/20 0806 01/23/20 0534  NA 134* 133* 137 135 137  K 3.5 3.6 3.9 3.2* 3.5  CL 98 98 100 99 101  CO2 24 22 26 22 24   GLUCOSE 100* 96 84 111* 77  BUN 66* 69* 69* 71* 72*  CREATININE 5.83* 5.82* 5.85* 6.30* 6.53*  CALCIUM 6.4* 6.5* 6.6* 6.7* 7.0*  MG  --  1.1* 1.1* 1.5* 2.1  PHOS 3.0 3.1 2.7 3.3 3.9   GFR: Estimated Creatinine Clearance: 6.2 mL/min (A) (by C-G formula based on SCr of 6.53 mg/dL (H)). Liver Function Tests: Recent Labs  Lab 01/19/20 0415 01/20/20 0327 01/21/20 0410 01/22/20 0806 01/23/20 0534  ALBUMIN 1.8* 1.7* 1.8* 1.7* 1.7*   No results for input(s): LIPASE, AMYLASE in the last 168 hours. No results for input(s): AMMONIA in the last 168 hours. Coagulation Profile: No results for input(s): INR, PROTIME in the last 168 hours. Cardiac Enzymes: No results for input(s): CKTOTAL, CKMB, CKMBINDEX, TROPONINI in the last 168 hours. BNP (last 3 results) No results for input(s): PROBNP in the last 8760 hours. HbA1C: No results for input(s): HGBA1C in the last 72 hours. CBG: Recent Labs  Lab 01/22/20 0658 01/22/20 1119 01/22/20 1645 01/22/20 2115 01/23/20 0703  GLUCAP 110* 213* 130* 131* 89   Lipid Profile: No results for input(s): CHOL, HDL, LDLCALC, TRIG, CHOLHDL, LDLDIRECT in the last 72  hours. Thyroid Function Tests: No results for input(s): TSH, T4TOTAL, FREET4, T3FREE, THYROIDAB in the last 72 hours. Anemia Panel: No results for input(s): VITAMINB12, FOLATE, FERRITIN, TIBC, IRON, RETICCTPCT in the last 72 hours. Sepsis Labs: No results for input(s): PROCALCITON, LATICACIDVEN in the last 168 hours.  No results found for this or any previous visit (from  the past 240 hour(s)).    Radiology Studies: MR LUMBAR SPINE WO CONTRAST  Result Date: 01/21/2020 CLINICAL DATA:  Lumbar radiculopathy. EXAM: MRI LUMBAR SPINE WITHOUT CONTRAST TECHNIQUE: Multiplanar, multisequence MR imaging of the lumbar spine was performed. No intravenous contrast was administered. COMPARISON:  None. FINDINGS: Segmentation: 5 non rib-bearing lumbar type vertebral bodies are present. The lowest fully formed vertebral body is L5. Alignment: Limits anatomic. Some straightening of normal cervical lordosis is noted. Vertebrae: Edematous and fatty endplate marrow changes are present at L2-3. Mild degenerative changes are noted at L1-2. Marrow signal and vertebral body heights are otherwise normal. Conus medullaris and cauda equina: Conus extends to the L2 level. Conus and cauda equina appear normal. Paraspinal and other soft tissues: Limited imaging the abdomen is unremarkable. There is no significant adenopathy. No solid organ lesions are present. Disc levels: L1-2: Negative. L2-3: Negative. L3-4: Mild disc bulging is present without significant stenosis. L4-5: A leftward disc protrusion is present. Annular tear is noted. No compressive stenosis is evident. L5-S1: A rightward disc protrusion extends into the foramen. Moderate facet hypertrophy is worse on the right. IMPRESSION: 1. Mild disc bulging at L3-4. 2. Leftward disc protrusion and annular tear. This may impact the exiting left L4 nerve root. 3. Rightward disc protrusion and annular tear at L5-S1 without significant stenosis. 4. Moderate facet hypertrophy at L5-S1  is worse on the right. Electronically Signed   By: San Morelle M.D.   On: 01/21/2020 19:05     Scheduled Meds:  amLODipine  5 mg Oral Daily   Chlorhexidine Gluconate Cloth  6 each Topical Q0600   feeding supplement (ENSURE ENLIVE)  237 mL Oral BID BM   heparin  5,000 Units Subcutaneous Q8H   insulin aspart  0-5 Units Subcutaneous QHS   insulin aspart  0-9 Units Subcutaneous TID WC   insulin glargine  5 Units Subcutaneous Daily   mouth rinse  15 mL Mouth Rinse BID   polyethylene glycol  17 g Oral BID   potassium chloride  20 mEq Oral BID   senna  2 tablet Oral BID   sodium bicarbonate  1,300 mg Oral BID   Continuous Infusions:  sodium chloride Stopped (01/13/20 0735)     LOS: 15 days    Time spent: 25 minutes spent in the coordination of care today.    Jonnie Finner, DO [facility]  If 7PM-7AM, please contact night-coverage [email or web address] 01/23/2020, 11:46 AM

## 2020-01-23 NOTE — Plan of Care (Signed)
  Problem: Education: Goal: Knowledge of General Education information will improve Description: Including pain rating scale, medication(s)/side effects and non-pharmacologic comfort measures Outcome: Progressing   Problem: Safety: Goal: Ability to remain free from injury will improve Outcome: Progressing   

## 2020-01-23 NOTE — Progress Notes (Signed)
135 of residual urine detected on her bladder scan.

## 2020-01-23 NOTE — Progress Notes (Signed)
Admit: 01/08/2020 LOS: 41  37F AKI from ATN req CRRT through 4/6 now following for recovery of GFR / HD  Subjective:  Cindy Burns SCr up to 6.5 from 5.9; K and HCo3 ok . Still good PO  . 0.8L UOP  04/17 0701 - 04/18 0700 In: 357 [P.O.:357] Out: 800 [Urine:800]  Filed Weights   01/17/20 2101 01/18/20 2100 01/22/20 2114  Weight: 55.7 kg 61.6 kg 56.7 kg    Scheduled Meds: . amLODipine  5 mg Oral Daily  . Chlorhexidine Gluconate Cloth  6 each Topical Q0600  . feeding supplement (ENSURE ENLIVE)  237 mL Oral BID BM  . heparin  5,000 Units Subcutaneous Q8H  . insulin aspart  0-5 Units Subcutaneous QHS  . insulin aspart  0-9 Units Subcutaneous TID WC  . insulin glargine  5 Units Subcutaneous Daily  . mouth rinse  15 mL Mouth Rinse BID  . polyethylene glycol  17 g Oral BID  . potassium chloride  20 mEq Oral BID  . senna  2 tablet Oral BID  . sodium bicarbonate  1,300 mg Oral BID   Continuous Infusions: . sodium chloride Stopped (01/13/20 0735)   PRN Meds:.sodium chloride, dextrose, ipratropium-albuterol, labetalol  Current Labs: reviewed    Physical Exam:  Blood pressure (!) 128/58, pulse 71, temperature 98 F (36.7 C), temperature source Oral, resp. rate 18, height 5\' 2"  (1.575 m), weight 56.7 kg, SpO2 98 %. NAD, I nchair Regular, no rub CTAB No sig LEE L IJ Temp HD cath bandaged S/nt/nd  A 1. AKI, nl baseline; likely ATN from hypovolemia/hemodynamic; stable low GFR, UOP unclear not beign fully quantified, not overtly uremic right now 2. Anemia; Hb stable 7s 3. HTN< BP stable; amlodipine 4. Acidosis on NaHCO3 5. DKA resolved 6. LEE 7. DM2 8. COPD 9. Hypomag 10. L IJ Temp HD cath present  P . Stuck w/o GFR recovery yet, adequate UOP, no uremia . No strong reason for HD, cont to follow . Still would leave in Temp HD cath for now . Daily weights, Daily Renal Panel, Strict I/Os, Avoid nephrotoxins (NSAIDs, judicious IV Contrast)    Pearson Grippe MD 01/23/2020, 11:53  AM  Recent Labs  Lab 01/21/20 0410 01/22/20 0806 01/23/20 0534  NA 137 135 137  K 3.9 3.2* 3.5  CL 100 99 101  CO2 26 22 24   GLUCOSE 84 111* 77  BUN 69* 71* 72*  CREATININE 5.85* 6.30* 6.53*  CALCIUM 6.6* 6.7* 7.0*  PHOS 2.7 3.3 3.9   Recent Labs  Lab 01/21/20 0410 01/22/20 0806 01/23/20 0534  WBC 4.5 5.4 3.5*  NEUTROABS 3.0 3.9 2.1  HGB 7.9* 7.4* 7.5*  HCT 24.4* 23.0* 23.5*  MCV 91.4 91.3 91.4  PLT 218 223 220

## 2020-01-23 NOTE — Plan of Care (Signed)
  Problem: Clinical Measurements: Goal: Will remain free from infection Outcome: Adequate for Discharge   

## 2020-01-24 DIAGNOSIS — E872 Acidosis: Secondary | ICD-10-CM | POA: Diagnosis not present

## 2020-01-24 DIAGNOSIS — I10 Essential (primary) hypertension: Secondary | ICD-10-CM | POA: Diagnosis not present

## 2020-01-24 DIAGNOSIS — N179 Acute kidney failure, unspecified: Secondary | ICD-10-CM | POA: Diagnosis not present

## 2020-01-24 LAB — RENAL FUNCTION PANEL
Albumin: 1.8 g/dL — ABNORMAL LOW (ref 3.5–5.0)
Anion gap: 19 — ABNORMAL HIGH (ref 5–15)
BUN: 73 mg/dL — ABNORMAL HIGH (ref 8–23)
CO2: 18 mmol/L — ABNORMAL LOW (ref 22–32)
Calcium: 7.4 mg/dL — ABNORMAL LOW (ref 8.9–10.3)
Chloride: 101 mmol/L (ref 98–111)
Creatinine, Ser: 6.55 mg/dL — ABNORMAL HIGH (ref 0.44–1.00)
GFR calc Af Amer: 7 mL/min — ABNORMAL LOW (ref >60)
GFR calc non Af Amer: 6 mL/min — ABNORMAL LOW (ref >60)
Glucose, Bld: 72 mg/dL (ref 70–99)
Phosphorus: 4.2 mg/dL (ref 2.5–4.6)
Potassium: 4.3 mmol/L (ref 3.5–5.1)
Sodium: 138 mmol/L (ref 135–145)

## 2020-01-24 LAB — GLUCOSE, CAPILLARY
Glucose-Capillary: 76 mg/dL (ref 70–99)
Glucose-Capillary: 191 mg/dL — ABNORMAL HIGH (ref 70–99)
Glucose-Capillary: 151 mg/dL — ABNORMAL HIGH (ref 70–99)
Glucose-Capillary: 105 mg/dL — ABNORMAL HIGH (ref 70–99)
Glucose-Capillary: 130 mg/dL — ABNORMAL HIGH (ref 70–99)
Glucose-Capillary: 131 mg/dL — ABNORMAL HIGH (ref 70–99)
Glucose-Capillary: 139 mg/dL — ABNORMAL HIGH (ref 70–99)
Glucose-Capillary: 156 mg/dL — ABNORMAL HIGH (ref 70–99)
Glucose-Capillary: 65 mg/dL — ABNORMAL LOW (ref 70–99)
Glucose-Capillary: 88 mg/dL (ref 70–99)
Glucose-Capillary: 59 mg/dL — ABNORMAL LOW (ref 70–99)

## 2020-01-24 NOTE — Progress Notes (Signed)
Admit: 01/08/2020 LOS: 24  63F AKI from ATN req CRRT through 4/6 now following for recovery of GFR / HD  Subjective:  Cindy Burns SCr unchaged, K 4.3; made 1.9L UOP . Still good PO   04/18 0701 - 04/19 0700 In: 1610 [P.O.:900] Out: 1950 [Urine:1950]  Filed Weights   01/18/20 2100 01/22/20 2114 01/23/20 2022  Weight: 61.6 kg 56.7 kg 56.7 kg    Scheduled Meds: . amLODipine  5 mg Oral Daily  . Chlorhexidine Gluconate Cloth  6 each Topical Q0600  . feeding supplement (ENSURE ENLIVE)  237 mL Oral BID BM  . heparin  5,000 Units Subcutaneous Q8H  . insulin aspart  0-5 Units Subcutaneous QHS  . insulin aspart  0-9 Units Subcutaneous TID WC  . insulin glargine  5 Units Subcutaneous Daily  . mouth rinse  15 mL Mouth Rinse BID  . polyethylene glycol  17 g Oral BID  . senna  2 tablet Oral BID  . sodium bicarbonate  1,300 mg Oral BID   Continuous Infusions: . sodium chloride Stopped (01/13/20 0735)   PRN Meds:.sodium chloride, dextrose, ipratropium-albuterol, labetalol  Current Labs: reviewed    Physical Exam:  Blood pressure 134/64, pulse 85, temperature 98 F (36.7 C), temperature source Oral, resp. rate 18, height 5\' 2"  (1.575 m), weight 56.7 kg, SpO2 95 %. NAD, I nchair Regular, no rub CTAB No sig LEE L IJ Temp HD cath bandaged S/nt/nd  A 1. AKI, nl baseline; likely ATN from hypovolemia/hemodynamic; stable low GFR, UOP improving and suggestive of GFR recovery, not overtly uremic right now 2. Anemia; Hb stable 7s; ASx 3. HTN BP stable; amlodipine 4. Acidosis on NaHCO3 5. DKA resolved 6. LEE 7. DM2 8. COPD 9. Hypomag 10. L IJ Temp HD cath present  P . Stuck w/o GFR recovery yet, adequate UOP, no uremia;  . No strong reason for HD, cont to follow . Still would leave in Temp HD cath for now; if stable/improved tomorrow will remove . Daily weights, Daily Renal Panel, Strict I/Os, Avoid nephrotoxins (NSAIDs, judicious IV Contrast)    Pearson Grippe MD 01/24/2020, 12:15  PM  Recent Labs  Lab 01/22/20 0806 01/23/20 0534 01/24/20 0501  NA 135 137 138  K 3.2* 3.5 4.3  CL 99 101 101  CO2 22 24 18*  GLUCOSE 111* 77 72  BUN 71* 72* 73*  CREATININE 6.30* 6.53* 6.55*  CALCIUM 6.7* 7.0* 7.4*  PHOS 3.3 3.9 4.2   Recent Labs  Lab 01/21/20 0410 01/22/20 0806 01/23/20 0534  WBC 4.5 5.4 3.5*  NEUTROABS 3.0 3.9 2.1  HGB 7.9* 7.4* 7.5*  HCT 24.4* 23.0* 23.5*  MCV 91.4 91.3 91.4  PLT 218 223 220

## 2020-01-24 NOTE — Progress Notes (Signed)
Progress Note    Cindy Burns  AVW:[id number] DOB: 09-22-1946  DOA: 01/08/2020 PCP: [name], MD    Brief Narrative:    Medical records reviewed and are as summarized below:  Cindy Burns is an 74 y.o. female with past medial history significant for breast cancer, copd, tobacco use, diabetes, MDD admitted April 3 with acute renal failure requiring CRRT, hyperkalemia, anion gap metabolic acidosis secondary to DKA, acute respiratory failure requiring intubation extubated 01/11/20. Kidney function plateued and 4/18 began to worsen again.   Assessment/Plan:   Principal Problem:   Renal failure Active Problems:   Hypocalcemia   Metabolic acidosis   Acute respiratory failure with hypoxia (HCC)   CIGARETTE SMOKER   Hypertension   UTI (urinary tract infection)   Pressure injury of skin   Encounter for central line placement   Pain and swelling of ankle, left  #1.  Acute renal failure secondary to severe volume depletion in the setting of DKA/UTI/metabolic acidosis.  Creatinine continues to trend up since yesterday. In addition CO2 trending down inspite of supplement bid and anion gap increased to 19. Making urine but not useful urine -defer management to nephrology -intake and output  #2.  Acute respiratory failure with hypoxia in the setting of COPD and former tobacco use.  Patient intubated on admission.  Extubated April 6.  Oxygen saturation levels greater than 90% on room air. -Nebulizers -Incentive spirometry -Monitor oxygen saturation level -Oxygen supplementation as indicated  #3.  Hypokalemia/hypocalcemia/hypomagnesemia -Replete -Monitor -4/19 stable  #4.  Urinary tract infection.  Pansensitive E. coli noted on culture.  Blood cultures with no growth to date.  Has completed 7-day Rocephin course -Monitor  #5. Normocytic anemia. Hg stable. No s/sx of bleeding. -monitor  #6. Diabetes type 2.  Uncontrolled. HgA1c 8.5. Admitted with DKA, home meds include  Januvia.  -lantus -SSI for optimal control  #7. Hypertension. Fair control -monitor  #8. Left foot drop/pain and swelling left ankle. xrays negative, PT/OT, MRI Mild disc bulging at L3-4. Leftward disc protrusion and annular tear. This may impact the exiting left L4 nerve root. Rightward disc protrusion and annular tear at L5-S1 without significant stenosis. Moderate facet hypertrophy at L5-S1 is worse on the right. Evaluated by neurosurgery who opine no immediate intervention recommended OP follow up with possible EMG. -    Family Communication/Anticipated D/C date and plan/Code Status   DVT prophylaxis: heparin ordered. Code Status: Full Code.  Family Communication: patient with question answered Disposition Plan: Status is: Inpatient  Remains inpatient appropriate because:Inpatient level of care appropriate due to severity of illness   Dispo: The patient is from: Home              Anticipated d/c is to: Home              Anticipated d/c date is: 2 days              Patient currently is not medically stable to d/c.          Medical Consultants:    Nephrology  neurosurgery   Anti-Infectives:    None  Subjective:   Denies pain/discomfort  Objective:    Vitals:   01/23/20 1647 01/23/20 2022 01/24/20 0603 01/24/20 0924  BP: (!) 140/58 (!) 134/55 138/63 134/64  Pulse: 83 84 80 85  Resp: 18 18 20 18   Temp: 99.1 F (37.3 C) 98.7 F (37.1 C) 98.4 F (36.9 C) 98 F (36.7 C)  TempSrc: Oral  Oral Oral Oral  SpO2: 97% 94% 92% 95%  Weight:  56.7 kg    Height:        Intake/Output Summary (Last 24 hours) at 01/24/2020 1125 Last data filed at 01/24/2020 1052 Gross per 24 hour  Intake 780 ml  Output 1700 ml  Net -920 ml   Filed Weights   01/18/20 2100 01/22/20 2114 01/23/20 2022  Weight: 61.6 kg 56.7 kg 56.7 kg    Exam: General: Awake alert no acute distress CV: Regular rate and rhythm no murmur gallop or rub Respiratory: No increased work of  breathing breath sounds are distant but clear I hear no wheeze no crackles Abdomen: Soft positive bowel sounds throughout no guarding or rebounding nontender to palpation Musculoskeletal: joints without swelling/erythema Neur: alert and oriented x3. Speech clear   Data Reviewed:   I have personally reviewed following labs and imaging studies:  Labs: Labs show the following:   Basic Metabolic Panel: Recent Labs  Lab 01/20/20 0327 01/20/20 0327 01/21/20 0410 01/21/20 0410 01/22/20 0806 01/22/20 0806 01/23/20 0534 01/24/20 0501  NA 133*  --  137  --  135  --  137 138  K 3.6   < > 3.9   < > 3.2*   < > 3.5 4.3  CL 98  --  100  --  99  --  101 101  CO2 22  --  26  --  22  --  24 18*  GLUCOSE 96  --  84  --  111*  --  77 72  BUN 69*  --  69*  --  71*  --  72* 73*  CREATININE 5.82*  --  5.85*  --  6.30*  --  6.53* 6.55*  CALCIUM 6.5*  --  6.6*  --  6.7*  --  7.0* 7.4*  MG 1.1*  --  1.1*  --  1.5*  --  2.1  --   PHOS 3.1  --  2.7  --  3.3  --  3.9 4.2   < > = values in this interval not displayed.   GFR Estimated Creatinine Clearance: 6.2 mL/min (A) (by C-G formula based on SCr of 6.55 mg/dL (H)). Liver Function Tests: Recent Labs  Lab 01/20/20 0327 01/21/20 0410 01/22/20 0806 01/23/20 0534 01/24/20 0501  ALBUMIN 1.7* 1.8* 1.7* 1.7* 1.8*   No results for input(s): LIPASE, AMYLASE in the last 168 hours. No results for input(s): AMMONIA in the last 168 hours. Coagulation profile No results for input(s): INR, PROTIME in the last 168 hours.  CBC: Recent Labs  Lab 01/19/20 0415 01/20/20 0555 01/21/20 0410 01/22/20 0806 01/23/20 0534  WBC 6.9 4.9 4.5 5.4 3.5*  NEUTROABS 5.4 3.5 3.0 3.9 2.1  HGB 7.6* 7.6* 7.9* 7.4* 7.5*  HCT 23.4* 23.6* 24.4* 23.0* 23.5*  MCV 89.7 90.8 91.4 91.3 91.4  PLT 167 185 218 223 220   Cardiac Enzymes: No results for input(s): CKTOTAL, CKMB, CKMBINDEX, TROPONINI in the last 168 hours. BNP (last 3 results) No results for input(s): PROBNP  in the last 8760 hours. CBG: Recent Labs  Lab 01/22/20 2115 01/23/20 0703 01/23/20 2025 01/24/20 0642 01/24/20 1118  GLUCAP 131* 89 209* 76 191*   D-Dimer: No results for input(s): DDIMER in the last 72 hours. Hgb A1c: No results for input(s): HGBA1C in the last 72 hours. Lipid Profile: No results for input(s): CHOL, HDL, LDLCALC, TRIG, CHOLHDL, LDLDIRECT in the last 72 hours. Thyroid function studies: No results for input(s):  TSH, T4TOTAL, T3FREE, THYROIDAB in the last 72 hours.  Invalid input(s): FREET3 Anemia work up: No results for input(s): VITAMINB12, FOLATE, FERRITIN, TIBC, IRON, RETICCTPCT in the last 72 hours. Sepsis Labs: Recent Labs  Lab 01/20/20 0555 01/21/20 0410 01/22/20 0806 01/23/20 0534  WBC 4.9 4.5 5.4 3.5*    Microbiology No results found for this or any previous visit (from the past 240 hour(s)).  Procedures and diagnostic studies:  No results found.  Medications:   . amLODipine  5 mg Oral Daily  . Chlorhexidine Gluconate Cloth  6 each Topical Q0600  . feeding supplement (ENSURE ENLIVE)  237 mL Oral BID BM  . heparin  5,000 Units Subcutaneous Q8H  . insulin aspart  0-5 Units Subcutaneous QHS  . insulin aspart  0-9 Units Subcutaneous TID WC  . insulin glargine  5 Units Subcutaneous Daily  . mouth rinse  15 mL Mouth Rinse BID  . polyethylene glycol  17 g Oral BID  . senna  2 tablet Oral BID  . sodium bicarbonate  1,300 mg Oral BID   Continuous Infusions: . sodium chloride Stopped (01/13/20 0735)     LOS: 16 days   Radene Gunning NP  [facility]   How to contact the [facility] Attending or Consulting provider [place] or covering provider during after hours [place], for this patient?  1. Check the care team in [facility] and look for a) attending/consulting [facility] provider listed and b) the [facility] team listed 2. Log into [email or web address] and use [place]'s universal password to access. If you do not have the password, please contact the hospital  operator. 3. Locate the [facility] provider you are looking for under [facility] and page to a number that you can be directly reached. 4. If you still have difficulty reaching the provider, please page the [facility] (Director on Call) for the Hospitalists listed on amion for assistance.  01/24/2020, 11:25 AM

## 2020-01-24 NOTE — Progress Notes (Signed)
Orthopedic Tech Progress Note Patient Details:  JOYCELYN LISKA Jan 15, 1946 [id number] Called in order to [facility] for an AFO/DORSIFLEXION ASSIST Patient ID: PAMALA HAYMAN, female   DOB: [birth year], 74 y.o.   MRN: [id number]   Janit Pagan 01/24/2020, 1:52 PM

## 2020-01-24 NOTE — Progress Notes (Signed)
Physical Therapy Treatment Patient Details Name: Cindy Burns MRN: [id number] DOB: 1945-11-08 Today's Date: 01/24/2020    History of Present Illness Pt is a 74 year old woman admitted on 01/08/20 to [facility] and subsequently transferred to [facility] with respiratory failure requring intubation, DKA, UTI, hyperkalemia and renal failure requiring CRRT. Extubated 01/11/20. PMH: COPD, breast cancer, Dm, MDD.    PT Comments    Continuing work on functional mobility and activity tolerance;  Successful trial of walking with an ace wrap for dorsiflexion assist and notable better L toe clearance; Obtained an Orthotist consult   Follow Up Recommendations  SNF;Supervision/Assistance - 24 hour     Equipment Recommendations  Rolling walker with 5" wheels;3in1 (PT)    Recommendations for Other Services Other (comment)(Orthotist for L dorsiflexion assist)     Precautions / Restrictions Precautions Precautions: Fall Precaution Comments: L foot clearance difficulty    Mobility  Bed Mobility Overal bed mobility: Needs Assistance Bed Mobility: Supine to Sit     Supine to sit: Min guard     General bed mobility comments: Incr time  Transfers Overall transfer level: Needs assistance Equipment used: Rolling walker (2 wheeled) Transfers: Sit to/from Stand Sit to Stand: Min guard;From elevated surface         General transfer comment: min guard for safety, no physical assist neededCues for hand placement  Ambulation/Gait Ambulation/Gait assistance: Min guard Gait Distance (Feet): 70 Feet Assistive device: Rolling walker (2 wheeled) Gait Pattern/deviations: Step-through pattern Gait velocity: dec   General Gait Details: Used an ace wrap to help with dorsiflexion assist; better gait pattern and foot clearance; will look into an Orthoptist for an AFO   Stairs             Wheelchair Mobility    Modified Rankin (Stroke Patients Only)       Balance     Sitting balance-Leahy  Scale: Fair Sitting balance - Comments: no LOB noted     Standing balance-Leahy Scale: Poor Standing balance comment: reliant on UE support                            Cognition Arousal/Alertness: Awake/alert Behavior During Therapy: WFL for tasks assessed/performed Overall Cognitive Status: No family/caregiver present to determine baseline cognitive functioning                                 General Comments: inc time to sequence and problem solve with RW      Exercises      General Comments General comments (skin integrity, edema, etc.): Contacted Dyanne Carrel, NP re: getting an Orthotist consult      Pertinent Vitals/Pain Pain Assessment: No/denies pain    Home Living                      Prior Function            PT Goals (current goals can now be found in the care plan section) Acute Rehab PT Goals Patient Stated Goal: to get betterand go home to her puppies PT Goal Formulation: With patient Time For Goal Achievement: 01/26/20 Potential to Achieve Goals: Fair Progress towards PT goals: Progressing toward goals    Frequency    Min 3X/week      PT Plan Current plan remains appropriate    Co-evaluation  AM-PAC PT "6 Clicks" Mobility   Outcome Measure  Help needed turning from your back to your side while in a flat bed without using bedrails?: None Help needed moving from lying on your back to sitting on the side of a flat bed without using bedrails?: None Help needed moving to and from a bed to a chair (including a wheelchair)?: A Little Help needed standing up from a chair using your arms (e.g., wheelchair or bedside chair)?: A Little Help needed to walk in hospital room?: A Little Help needed climbing 3-5 steps with a railing? : A Lot 6 Click Score: 19    End of Session Equipment Utilized During Treatment: Gait belt Activity Tolerance: Patient tolerated treatment well Patient left: in chair;with  call bell/phone within reach;with chair alarm set Nurse Communication: Mobility status PT Visit Diagnosis: Other abnormalities of gait and mobility (R26.89);Unsteadiness on feet (R26.81);Muscle weakness (generalized) (M62.81)     Time: 3875-6433 PT Time Calculation (min) (ACUTE ONLY): 29 min  Charges:  $Gait Training: 8-22 mins $Therapeutic Activity: 8-22 mins                     Roney Marion, PT  Acute Rehabilitation Services Pager [phone] [place] 01/24/2020, 7:02 PM

## 2020-01-25 DIAGNOSIS — E872 Acidosis: Secondary | ICD-10-CM | POA: Diagnosis not present

## 2020-01-25 DIAGNOSIS — N179 Acute kidney failure, unspecified: Secondary | ICD-10-CM | POA: Diagnosis not present

## 2020-01-25 DIAGNOSIS — I10 Essential (primary) hypertension: Secondary | ICD-10-CM | POA: Diagnosis not present

## 2020-01-25 LAB — RENAL FUNCTION PANEL
Albumin: 1.9 g/dL — ABNORMAL LOW (ref 3.5–5.0)
Anion gap: 12 (ref 5–15)
BUN: 78 mg/dL — ABNORMAL HIGH (ref 8–23)
CO2: 24 mmol/L (ref 22–32)
Calcium: 7.4 mg/dL — ABNORMAL LOW (ref 8.9–10.3)
Chloride: 101 mmol/L (ref 98–111)
Creatinine, Ser: 6.45 mg/dL — ABNORMAL HIGH (ref 0.44–1.00)
GFR calc Af Amer: 7 mL/min — ABNORMAL LOW (ref >60)
GFR calc non Af Amer: 6 mL/min — ABNORMAL LOW (ref >60)
Glucose, Bld: 103 mg/dL — ABNORMAL HIGH (ref 70–99)
Phosphorus: 3.8 mg/dL (ref 2.5–4.6)
Potassium: 4.5 mmol/L (ref 3.5–5.1)
Sodium: 137 mmol/L (ref 135–145)

## 2020-01-25 LAB — CBC WITH DIFFERENTIAL/PLATELET
Abs Immature Granulocytes: 0.16 10*3/uL — ABNORMAL HIGH (ref 0.00–0.07)
Basophils Absolute: 0 10*3/uL (ref 0.0–0.1)
Basophils Relative: 0 %
Eosinophils Absolute: 0.1 10*3/uL (ref 0.0–0.5)
Eosinophils Relative: 2 %
HCT: 23.5 % — ABNORMAL LOW (ref 36.0–46.0)
Hemoglobin: 7.4 g/dL — ABNORMAL LOW (ref 12.0–15.0)
Immature Granulocytes: 4 %
Lymphocytes Relative: 26 %
Lymphs Abs: 1.2 10*3/uL (ref 0.7–4.0)
MCH: 29 pg (ref 26.0–34.0)
MCHC: 31.5 g/dL (ref 30.0–36.0)
MCV: 92.2 fL (ref 80.0–100.0)
Monocytes Absolute: 0.4 10*3/uL (ref 0.1–1.0)
Monocytes Relative: 9 %
Neutro Abs: 2.7 10*3/uL (ref 1.7–7.7)
Neutrophils Relative %: 59 %
Platelets: 256 10*3/uL (ref 150–400)
RBC: 2.55 MIL/uL — ABNORMAL LOW (ref 3.87–5.11)
RDW: 16.7 % — ABNORMAL HIGH (ref 11.5–15.5)
WBC: 4.5 10*3/uL (ref 4.0–10.5)
nRBC: 0 % (ref 0.0–0.2)

## 2020-01-25 LAB — GLUCOSE, CAPILLARY
Glucose-Capillary: 101 mg/dL — ABNORMAL HIGH (ref 70–99)
Glucose-Capillary: 274 mg/dL — ABNORMAL HIGH (ref 70–99)
Glucose-Capillary: 130 mg/dL — ABNORMAL HIGH (ref 70–99)

## 2020-01-25 LAB — MAGNESIUM: Magnesium: 1.6 mg/dL — ABNORMAL LOW (ref 1.7–2.4)

## 2020-01-25 MED ORDER — MAGNESIUM SULFATE 2 GM/50ML IV SOLN
2.0000 g | Freq: Once | INTRAVENOUS | Status: AC
  Administered 2020-01-25: 2 g via INTRAVENOUS
  Filled 2020-01-25: qty 50

## 2020-01-25 MED ORDER — INSULIN ASPART 100 UNIT/ML ~~LOC~~ SOLN
0.0000 [IU] | Freq: Three times a day (TID) | SUBCUTANEOUS | Status: DC
  Administered 2020-01-25 (×2): 2 [IU] via SUBCUTANEOUS
  Administered 2020-01-26: 4 [IU] via SUBCUTANEOUS
  Administered 2020-01-26 – 2020-01-27 (×2): 2 [IU] via SUBCUTANEOUS
  Administered 2020-01-27: 1 [IU] via SUBCUTANEOUS
  Administered 2020-01-28: 3 [IU] via SUBCUTANEOUS
  Administered 2020-01-28 – 2020-01-30 (×2): 1 [IU] via SUBCUTANEOUS
  Administered 2020-01-31 – 2020-02-02 (×2): 2 [IU] via SUBCUTANEOUS
  Administered 2020-02-03 (×2): 1 [IU] via SUBCUTANEOUS
  Administered 2020-02-05: 2 [IU] via SUBCUTANEOUS
  Administered 2020-02-06 – 2020-02-16 (×9): 1 [IU] via SUBCUTANEOUS
  Administered 2020-02-17: 2 [IU] via SUBCUTANEOUS
  Administered 2020-02-17: 1 [IU] via SUBCUTANEOUS
  Administered 2020-02-17 – 2020-02-18 (×3): 2 [IU] via SUBCUTANEOUS

## 2020-01-25 MED ORDER — INSULIN ASPART 100 UNIT/ML ~~LOC~~ SOLN
0.0000 [IU] | Freq: Every day | SUBCUTANEOUS | Status: DC
  Administered 2020-02-08: 4 [IU] via SUBCUTANEOUS
  Administered 2020-02-09 – 2020-02-16 (×4): 2 [IU] via SUBCUTANEOUS
  Administered 2020-02-17 – 2020-02-18 (×2): 4 [IU] via SUBCUTANEOUS

## 2020-01-25 NOTE — Progress Notes (Signed)
Admit: 01/08/2020 LOS: 4  77F AKI from ATN req CRRT through 4/6 now following for recovery of GFR / HD  Subjective:  . Slight improvement in SCR . UOP not well quantified . Still good PO, no N/V  04/19 0701 - 04/20 0700 In: 780 [P.O.:780] Out: 300 [Urine:300]  Filed Weights   01/22/20 2114 01/23/20 2022 01/25/20 0341  Weight: 56.7 kg 56.7 kg 53.6 kg    Scheduled Meds: . amLODipine  5 mg Oral Daily  . Chlorhexidine Gluconate Cloth  6 each Topical Q0600  . feeding supplement (ENSURE ENLIVE)  237 mL Oral BID BM  . heparin  5,000 Units Subcutaneous Q8H  . insulin aspart  0-5 Units Subcutaneous QHS  . insulin aspart  0-6 Units Subcutaneous TID WC  . mouth rinse  15 mL Mouth Rinse BID  . polyethylene glycol  17 g Oral BID  . senna  2 tablet Oral BID  . sodium bicarbonate  1,300 mg Oral BID   Continuous Infusions: . sodium chloride Stopped (01/13/20 0735)   PRN Meds:.sodium chloride, dextrose, ipratropium-albuterol, labetalol  Current Labs: reviewed    Physical Exam:  Blood pressure (!) 135/56, pulse 83, temperature 98 F (36.7 C), temperature source Oral, resp. rate 18, height 5\' 2"  (1.575 m), weight 53.6 kg, SpO2 99 %. NAD, I nchair Regular, no rub CTAB No sig LEE L IJ Temp HD cath bandaged S/nt/nd  A 1. AKI, nl baseline; likely ATN from hypovolemia/hemodynamic; stable low GFR, UOP improving and suggestive of GFR recovery; SCr slightly improved today, not overtly uremic right now 2. Anemia; Hb stable 7s; ASx 3. HTN BP stable; amlodipine 4. Acidosis on NaHCO3, stable HCO3 5. DKA resolved 6. LEE 7. DM2 8. COPD 9. Hypomag 10. L IJ Temp HD cath present  P . Remove Temp HD cath today . Cont to trend labs / UOP . Daily weights, Daily Renal Panel, Strict I/Os, Avoid nephrotoxins (NSAIDs, judicious IV Contrast)    Pearson Grippe MD 01/25/2020, 12:44 PM  Recent Labs  Lab 01/23/20 0534 01/24/20 0501 01/25/20 0416  NA 137 138 137  K 3.5 4.3 4.5  CL 101 101 101   CO2 24 18* 24  GLUCOSE 77 72 103*  BUN 72* 73* 78*  CREATININE 6.53* 6.55* 6.45*  CALCIUM 7.0* 7.4* 7.4*  PHOS 3.9 4.2 3.8   Recent Labs  Lab 01/22/20 0806 01/23/20 0534 01/25/20 0416  WBC 5.4 3.5* 4.5  NEUTROABS 3.9 2.1 2.7  HGB 7.4* 7.5* 7.4*  HCT 23.0* 23.5* 23.5*  MCV 91.3 91.4 92.2  PLT 223 220 256

## 2020-01-25 NOTE — TOC Progression Note (Signed)
Transition of Care [facility]) - Progression Note    Patient Details  Name: Cindy Burns MRN: [id number] Date of Birth: 1945-11-05  [facility] CM/SW Contact  [name], LCSW Phone Number: 01/25/2020, 3:22 PM  Clinical Narrative: Visited with patient and requested choice regarding her home health services. Patient chose [facility] [facility] [place]. Ms. Molony was advised that if neither of these [facility] agencies could service her, she will be advised.  CSW will continue to follow and provide SW intervention services through discharge.      Expected Discharge Plan: [place] [facility](CSW will also look into PCS services for patient) Barriers to Discharge: Continued Medical Work up  Expected Discharge Plan and Services Expected Discharge Plan: [place] [facility](CSW will also look into PCS services for patient) In-house Referral: Clinical Social Work Discharge Planning Services: Other - See comment(Will apply for PCS services for patient and talk with patient about [facility] services (list will be provided))   Living arrangements for the past 2 months: Single Family Home                                       Social Determinants of Health (SDOH) Interventions    Readmission Risk Interventions No flowsheet data found.

## 2020-01-25 NOTE — Progress Notes (Signed)
Occupational Therapy Treatment Patient Details Name: Cindy Burns MRN: [id number] DOB: 1946-02-26 Today's Date: 01/25/2020    History of present illness Pt is a 74 year old woman admitted on 01/08/20 to [facility] and subsequently transferred to [facility] with respiratory failure requring intubation, DKA, UTI, hyperkalemia and renal failure requiring CRRT. Extubated 01/11/20. PMH: COPD, breast cancer, Dm, MDD.   OT comments  Pt making progress in therapy, demonstrating improved activity tolerance and independence in self-care tasks. Educated/instructed pt on safety strategies and body positioning with RW to increase balance and prevent future falls with fair understanding. Pt able to ambulate to/from bedroom sink with RW and min guard, noting 0 instances of LOB. Pt tolerated standing 1 x 4 min and 1 x 5 min with supervision while completing grooming and UB bathing task. Pt required 1 mod seated rest break due to fatigue. Pt continues to demonstrate decreased task initiation and difficulty sequencing requiring cues and step by step instructions to complete self-care tasks. Pt setup in bedside chair. All questions/concerns answered at this time. OT will continue to follow acutely. Continue to recommend SNF placement for additional rehab prior to discharge home.    Follow Up Recommendations  SNF;Supervision/Assistance - 24 hour    Equipment Recommendations  Other (comment)(TBD at next venue of care)    Recommendations for Other Services      Precautions / Restrictions Precautions Precautions: Fall Precaution Comments: L foot clearance difficulty Restrictions Weight Bearing Restrictions: No       Mobility Bed Mobility Overal bed mobility: Needs Assistance Bed Mobility: Supine to Sit     Supine to sit: Supervision     General bed mobility comments: To ensure balance and safety  Transfers Overall transfer level: Needs assistance Equipment used: Rolling walker (2 wheeled) Transfers: Sit to/from  Stand Sit to Stand: Min guard;Min assist         General transfer comment: Assist to power up into standing from low seat height. Cues for hand placement and safety.     Balance Overall balance assessment: Needs assistance Sitting-balance support: No upper extremity supported;Feet supported Sitting balance-Leahy Scale: Good       Standing balance-Leahy Scale: Fair                             ADL either performed or assessed with clinical judgement   ADL Overall ADL's : Needs assistance/impaired     Grooming: Wash/dry hands;Wash/dry face;Brushing hair;Applying deodorant;Set up;Supervision/safety;Standing Grooming Details (indicate cue type and reason): While standing at the sink Upper Body Bathing: Set up;Supervision/ safety;Standing Upper Body Bathing Details (indicate cue type and reason): While standing at the sink                         Functional mobility during ADLs: Min guard;Rolling walker General ADL Comments: Pt able to ambulate to/from bedroom sink with RW and min guard. Noted 0 instances of LOB. Pt tolerated standing 1 x 4 min and 1 x 5 min at the sink to complete grooming and bathing tasks. Pt required 1 mod seated rest break due to fatigue.      Vision       Perception     Praxis      Cognition Arousal/Alertness: Awake/alert Behavior During Therapy: Flat affect;WFL for tasks assessed/performed Overall Cognitive Status: No family/caregiver present to determine baseline cognitive functioning Area of Impairment: Following commands;Awareness;Problem solving  Following Commands: Follows one step commands inconsistently;Follows one step commands with increased time   Awareness: Emergent Problem Solving: Slow processing;Decreased initiation;Difficulty sequencing;Requires verbal cues General Comments: Pt required step by step instructions to sequence tasks. Increased time to process and complete all  activities.         Exercises     Shoulder Instructions       General Comments No signs/symptoms of distress.     Pertinent Vitals/ Pain       Pain Assessment: No/denies pain  Home Living                                          Prior Functioning/Environment              Frequency           Progress Toward Goals  OT Goals(current goals can now be found in the care plan section)  Progress towards OT goals: Progressing toward goals  ADL Goals Pt Will Perform Eating: with supervision;with set-up;sitting Pt Will Perform Grooming: with set-up;with supervision;sitting Pt Will Perform Upper Body Bathing: with supervision;with set-up;sitting Pt Will Perform Lower Body Bathing: with min assist;sit to/from stand Pt Will Perform Upper Body Dressing: with supervision;with set-up;sitting Pt Will Transfer to Toilet: with min assist;bedside commode;ambulating Pt/caregiver will Perform Home Exercise Program: Increased strength;Both right and left upper extremity;With Supervision Additional ADL Goal #1: Pt will perform bed mobility with moderate assistance in preparation for ADL. Additional ADL Goal #2: Pt will conistently follow 1 step commandsin 3/3 trials in non-distracting enviornment  Plan Discharge plan remains appropriate    Co-evaluation                 AM-PAC OT "6 Clicks" Daily Activity     Outcome Measure   Help from another person eating meals?: A Little Help from another person taking care of personal grooming?: A Little Help from another person toileting, which includes using toliet, bedpan, or urinal?: A Lot Help from another person bathing (including washing, rinsing, drying)?: A Lot Help from another person to put on and taking off regular upper body clothing?: A Little Help from another person to put on and taking off regular lower body clothing?: A Lot 6 Click Score: 15    End of Session Equipment Utilized During Treatment:  Gait belt;Rolling walker  OT Visit Diagnosis: Muscle weakness (generalized) (M62.81);Other symptoms and signs involving cognitive function;Unsteadiness on feet (R26.81)   Activity Tolerance Patient tolerated treatment well   Patient Left in chair;with call bell/phone within reach;with chair alarm set   Nurse Communication Mobility status        Time: 0263-7858 OT Time Calculation (min): 25 min  Charges: OT General Charges $OT Visit: 1 Visit OT Treatments $Self Care/Home Management : 8-22 mins $Therapeutic Activity: 8-22 mins  Mauri [place] OTR/L [phone]   Mauri Brooklyn 01/25/2020, 9:30 AM

## 2020-01-25 NOTE — Progress Notes (Signed)
Progress Note    Cindy Burns  NID:[id number] DOB: 29-Oct-1945  DOA: 01/08/2020 PCP: [name], MD    Brief Narrative:    Medical records reviewed and are as summarized below:  Cindy Burns is an 74 y.o. female with a past medical history significant for breast cancer, COPD, tobacco use, diabetes, MDD admitted April 3 with acute renal failure requiring CRRT, hyperkalemia, anion gap metabolic acidosis secondary to DKA, acute respiratory failure requiring intubation extubated 01/11/2020.  Respiratory failure improved diabetes control improved continues with metabolic acidosis as well as acute renal failure. SCR holding greater than 6.  Following nephrology  Assessment/Plan:   Principal Problem:   Renal failure Active Problems:   Hypocalcemia   Metabolic acidosis   Acute respiratory failure with hypoxia (HCC)   C[employer]AR[employer]   Hypertension   UTI (urinary tract infection)   Pressure injury of skin   Encounter for central line placement   Pain and swelling of ankle, left  #1.  Acute renal failure secondary to severe volume depletion in the setting of DKA/UTI/metabolic acidosis.  Creatinine holding at greater than 6.  He is receiving bicarb twice daily.  Gap improved.  Urine output good.  Evaluated by nephrology who opine no GFR recovery yet, no uremia and no strong reason for hemodialysis. -Defer management to nephrology -Monitor  #2.  Acute respiratory failure with hypoxia in the setting of COPD and former tobacco use.  Patient intubated on admission.  Extubated 2 days later.  Oxygen saturation levels greater than 90% on room air.  Her last cigarette was 2 days before she was admitted. -Nebulizers -Monitor oxygen saturation level -Oxygen supplementation as indicated -Incentive spirometry -Mobilize  #3.  Hypokalemia/hypocalcemia/hypomagnesemia. Mag level 1.6.  -Replete magnesium -Monitor  #4.  Urinary tract infection.  Pansensitive E. coli noted on culture.  Blood  cultures with no growth to date.  She completed 7-day course of Rocephin  #5.  Normocytic anemia.  Hemoglobin remained stable.  No sign symptoms of bleeding. -Monitor  #6.  Diabetes type 2.  Uncontrolled.  Hemoglobin A1c 8.5.  She was admitted with DKA as well. -Lantus -Sliding scale insulin for optimal control  #7.  Hypertension.  Fair control.  No antihypertensive meds noted on home medications. -Monitor  8.  Left foot drop/pain and swelling of the left ankle.  X-rays left ankle negative.  MRI lumbar spine reveals mild disc bulging at L3 and 4, leftward disc protrusion with annular tear, rightward disc protrusion and annular tear at L5-S1, moderate facet hypertrophy at L5-S1.  Evaluated by neurosurgery who opined no immediate intervention recommended outpatient follow-up with possible EMG -AFO/dorsiflexion assist per PT    Family Communication/Anticipated D/C date and plan/Code Status   DVT prophylaxis: heparin ordered. Code Status: Full Code.  Family Communication: patient with questions answered Disposition Plan: Status is: Inpatient  Remains inpatient appropriate because:Inpatient level of care appropriate due to severity of illness   Dispo: The patient is from: Home              Anticipated d/c is to: Home              Anticipated d/c date is: 2 days              Patient currently is not medically stable to d/c.          Medical Consultants:    Nephrology   neurosurgery   Anti-Infectives:    Completed 7 days of Rocephin for  urinary tract infection  Subjective:   Sitting on side of bed.  Denies pain or discomfort.  Denies shortness of breath  Objective:    Vitals:   01/24/20 1641 01/24/20 2107 01/25/20 0341 01/25/20 0822  BP: 134/66 (!) 141/58 (!) 151/71 (!) 135/56  Pulse: 79 82 85 83  Resp: 18     Temp: 98 F (36.7 C) 98.4 F (36.9 C) 99.1 F (37.3 C) 98 F (36.7 C)  TempSrc: Oral Oral Oral Oral  SpO2: 96% 95% 97% 99%  Weight:   53.6 kg    Height:        Intake/Output Summary (Last 24 hours) at 01/25/2020 1155 Last data filed at 01/25/2020 0437 Gross per 24 hour  Intake 660 ml  Output 0 ml  Net 660 ml   Filed Weights   01/22/20 2114 01/23/20 2022 01/25/20 0341  Weight: 56.7 kg 56.7 kg 53.6 kg    Exam: General: Awake alert well-nourished no acute distress CV: Regular rate and rhythm no murmur gallop or rub trace lower extremity edema Respiratory: Mild increased work of breathing with conversation.  Breath sounds with good air movement but somewhat coarse.  I hear no crackles no wheeze Abdomen: Obese soft positive bowel sounds throughout nontender to palpation no mass organomegaly noted Musculoskeletal: Joints without swelling/erythema full range of motion special boot to left foot intact Neuro: Alert oriented x3 speech clear facial symmetry cranial nerves II through XII grossly intact  Data Reviewed:   I have personally reviewed following labs and imaging studies:  Labs: Labs show the following:   Basic Metabolic Panel: Recent Labs  Lab 01/20/20 0327 01/20/20 0327 01/21/20 0410 01/21/20 0410 01/22/20 0806 01/22/20 0806 01/23/20 0534 01/23/20 0534 01/24/20 0501 01/25/20 0416  NA 133*   < > 137  --  135  --  137  --  138 137  K 3.6   < > 3.9   < > 3.2*   < > 3.5   < > 4.3 4.5  CL 98   < > 100  --  99  --  101  --  101 101  CO2 22   < > 26  --  22  --  24  --  18* 24  GLUCOSE 96   < > 84  --  111*  --  77  --  72 103*  BUN 69*   < > 69*  --  71*  --  72*  --  73* 78*  CREATININE 5.82*   < > 5.85*  --  6.30*  --  6.53*  --  6.55* 6.45*  CALCIUM 6.5*   < > 6.6*  --  6.7*  --  7.0*  --  7.4* 7.4*  MG 1.1*  --  1.1*  --  1.5*  --  2.1  --   --  1.6*  PHOS 3.1   < > 2.7  --  3.3  --  3.9  --  4.2 3.8   < > = values in this interval not displayed.   GFR Estimated Creatinine Clearance: 6.3 mL/min (A) (by C-G formula based on SCr of 6.45 mg/dL (H)). Liver Function Tests: Recent Labs  Lab 01/21/20 0410  01/22/20 0806 01/23/20 0534 01/24/20 0501 01/25/20 0416  ALBUMIN 1.8* 1.7* 1.7* 1.8* 1.9*   No results for input(s): LIPASE, AMYLASE in the last 168 hours. No results for input(s): AMMONIA in the last 168 hours. Coagulation profile No results for input(s): INR, PROTIME in the  last 168 hours.  CBC: Recent Labs  Lab 01/20/20 0555 01/21/20 0410 01/22/20 0806 01/23/20 0534 01/25/20 0416  WBC 4.9 4.5 5.4 3.5* 4.5  NEUTROABS 3.5 3.0 3.9 2.1 2.7  HGB 7.6* 7.9* 7.4* 7.5* 7.4*  HCT 23.6* 24.4* 23.0* 23.5* 23.5*  MCV 90.8 91.4 91.3 91.4 92.2  PLT 185 218 223 220 256   Cardiac Enzymes: No results for input(s): CKTOTAL, CKMB, CKMBINDEX, TROPONINI in the last 168 hours. BNP (last 3 results) No results for input(s): PROBNP in the last 8760 hours. CBG: Recent Labs  Lab 01/24/20 1118 01/24/20 1640 01/24/20 2109 01/25/20 0619 01/25/20 1103  GLUCAP 191* 151* 59* 101* 274*   D-Dimer: No results for input(s): DDIMER in the last 72 hours. Hgb A1c: No results for input(s): HGBA1C in the last 72 hours. Lipid Profile: No results for input(s): CHOL, HDL, LDLCALC, TRIG, CHOLHDL, LDLDIRECT in the last 72 hours. Thyroid function studies: No results for input(s): TSH, T4TOTAL, T3FREE, THYROIDAB in the last 72 hours.  Invalid input(s): FREET3 Anemia work up: No results for input(s): VITAMINB12, FOLATE, FERRITIN, TIBC, IRON, RETICCTPCT in the last 72 hours. Sepsis Labs: Recent Labs  Lab 01/21/20 0410 01/22/20 0806 01/23/20 0534 01/25/20 0416  WBC 4.5 5.4 3.5* 4.5    Microbiology No results found for this or any previous visit (from the past 240 hour(s)).  Procedures and diagnostic studies:  No results found.  Medications:   . amLODipine  5 mg Oral Daily  . Chlorhexidine Gluconate Cloth  6 each Topical Q0600  . feeding supplement (ENSURE ENLIVE)  237 mL Oral BID BM  . heparin  5,000 Units Subcutaneous Q8H  . insulin aspart  0-5 Units Subcutaneous QHS  . insulin aspart   0-6 Units Subcutaneous TID WC  . mouth rinse  15 mL Mouth Rinse BID  . polyethylene glycol  17 g Oral BID  . senna  2 tablet Oral BID  . sodium bicarbonate  1,300 mg Oral BID   Continuous Infusions: . sodium chloride Stopped (01/13/20 0735)     LOS: 17 days   Radene Gunning NP  [facility]   How to contact the [facility] Attending or Consulting provider [place] or covering provider during after hours [place], for this patient?  1. Check the care team in [facility] and look for a) attending/consulting TR[facility] provider listed and b) the [facility] team listed 2. Log into [email or web address] and use 's universal password to access. If you do not have the password, please contact the hospital operator. 3. Locate the [facility] provider you are looking for under [facility] and page to a number that you can be directly reached. 4. If you still have difficulty reaching the provider, please page the [facility] (Director on Call) for the Hospitalists listed on amion for assistance.  01/25/2020, 11:55 AM

## 2020-01-26 DIAGNOSIS — J9601 Acute respiratory failure with hypoxia: Secondary | ICD-10-CM | POA: Diagnosis not present

## 2020-01-26 DIAGNOSIS — N3000 Acute cystitis without hematuria: Secondary | ICD-10-CM | POA: Diagnosis not present

## 2020-01-26 DIAGNOSIS — N179 Acute kidney failure, unspecified: Secondary | ICD-10-CM | POA: Diagnosis not present

## 2020-01-26 LAB — RENAL FUNCTION PANEL
Albumin: 1.9 g/dL — ABNORMAL LOW (ref 3.5–5.0)
Anion gap: 14 (ref 5–15)
BUN: 83 mg/dL — ABNORMAL HIGH (ref 8–23)
CO2: 21 mmol/L — ABNORMAL LOW (ref 22–32)
Calcium: 7.5 mg/dL — ABNORMAL LOW (ref 8.9–10.3)
Chloride: 99 mmol/L (ref 98–111)
Creatinine, Ser: 6.28 mg/dL — ABNORMAL HIGH (ref 0.44–1.00)
GFR calc Af Amer: 7 mL/min — ABNORMAL LOW (ref >60)
GFR calc non Af Amer: 6 mL/min — ABNORMAL LOW (ref >60)
Glucose, Bld: 93 mg/dL (ref 70–99)
Phosphorus: 3.7 mg/dL (ref 2.5–4.6)
Potassium: 5 mmol/L (ref 3.5–5.1)
Sodium: 134 mmol/L — ABNORMAL LOW (ref 135–145)

## 2020-01-26 LAB — GLUCOSE, CAPILLARY
Glucose-Capillary: 86 mg/dL (ref 70–99)
Glucose-Capillary: 305 mg/dL — ABNORMAL HIGH (ref 70–99)
Glucose-Capillary: 217 mg/dL — ABNORMAL HIGH (ref 70–99)
Glucose-Capillary: 110 mg/dL — ABNORMAL HIGH (ref 70–99)

## 2020-01-26 LAB — MAGNESIUM: Magnesium: 2.1 mg/dL (ref 1.7–2.4)

## 2020-01-26 MED ORDER — ADULT MULTIVITAMIN W/MINERALS CH
1.0000 | ORAL_TABLET | Freq: Every day | ORAL | Status: DC
  Administered 2020-01-26 – 2020-01-31 (×6): 1 via ORAL
  Filled 2020-01-26 (×6): qty 1

## 2020-01-26 NOTE — Progress Notes (Signed)
Progress Note    Cindy Burns  WVP:[id number] DOB: September 14, 1946  DOA: 01/08/2020 PCP: [name], MD    Brief Narrative:     Medical records reviewed and are as summarized below:  Cindy Burns is an 74 y.o. female with a past medical history significant for breast cancer, COPD, tobacco use, diabetes, MDD admitted April 3 with acute renal failure requiring CRRT, hyperkalemia, anion gap metabolic acidosis secondary to DKA, acute respiratory failure requiring intubation extubated 01/11/2020.    Assessment/Plan:   Principal Problem:   Renal failure Active Problems:   CIGARETTE SMOKER   Hypertension   Pressure injury of skin   Encounter for central line placement   Pain and swelling of ankle, left   Hypocalcemia   Metabolic acidosis   Acute respiratory failure with hypoxia (HCC)   UTI (urinary tract infection)   Acute renal failure secondary to severe volume depletion in the setting of DKA/UTI/metabolic acidosis.  - Evaluated by nephrology who opine no GFR recovery yet, no uremia and no strong reason for hemodialysis. -Defer management to nephrology -Temp HD catheter removed on 4/20  Acute respiratory failure with hypoxia in the setting of COPD and former tobacco use.  Patient intubated on admission.  Extubated 2 days later.  Oxygen saturation levels greater than 90% on room air.  Her last cigarette was 2 days before she was admitted. -Nebulizers -Incentive spirometry -Mobilize  Hypokalemia/hypocalcemia/hypomagnesemia. -Replete   Urinary tract infection.  Pansensitive E. coli noted on culture.   -Blood cultures with no growth to date.   -completed 7-day course of Rocephin   Normocytic anemia.  Hemoglobin remaining stable.  No sign symptoms of bleeding. -Monitor   Diabetes type 2.  Uncontrolled.  Hemoglobin A1c 8.5.  -Lantus -Sliding scale insulin for optimal control -It appears plan is to send patient home on insulin but since patient has never been on  insulin prior will need diabetic coordinator consultation to teach patient about insulin  left foot drop/pain and swelling of the left ankle.  - X-rays left ankle negative.  MRI lumbar spine reveals mild disc bulging at L3 and 4, leftward disc protrusion with annular tear, rightward disc protrusion and annular tear at L5-S1, moderate facet hypertrophy at L5-S1.  Evaluated by neurosurgery who opined no immediate intervention recommended outpatient follow-up with possible EMG -AFO/dorsiflexion assist per PT  Pressure Injury 01/08/20 Coccyx Medial Stage 2 -  Partial thickness loss of dermis presenting as a shallow open injury with a red, pink wound bed without slough. small area of broken skin on coccyx (Active)  01/08/20 2200  Location: Coccyx  Location Orientation: Medial  Staging: Stage 2 -  Partial thickness loss of dermis presenting as a shallow open injury with a red, pink wound bed without slough.  Wound Description (Comments): small area of broken skin on coccyx  Present on Admission: Yes     Pressure Injury 01/09/20 Heel Left Deep Tissue Pressure Injury - Purple or maroon localized area of discolored intact skin or blood-filled blister due to damage of underlying soft tissue from pressure and/or shear. purplr color with red circle around it (Active)  01/09/20 1000  Location: Heel  Location Orientation: Left  Staging: Deep Tissue Pressure Injury - Purple or maroon localized area of discolored intact skin or blood-filled blister due to damage of underlying soft tissue from pressure and/or shear.  Wound Description (Comments): purplr color with red circle around it  Present on Admission: Yes  Family Communication/Anticipated D/C date and plan/Code Status   DVT prophylaxis: Heparin Code Status: Full Code.  Disposition Plan: Status is: Inpatient  Remains inpatient appropriate because:Inpatient level of care appropriate due to severity of illness   Dispo: The patient is  from: Home              Anticipated d/c is to: Home              Anticipated d/c date is: 1 day              Patient currently is not medically stable to d/c.          Medical Consultants:    Renal  PCCM     Subjective:   Patient without complaints today  Objective:    Vitals:   01/25/20 1639 01/25/20 2054 01/26/20 0600 01/26/20 0830  BP: (!) 148/57 (!) 125/47 (!) 120/46 (!) 127/53  Pulse: 81 80 79 85  Resp: 16   18  Temp: 98.4 F (36.9 C) (!) 97.5 F (36.4 C) 98 F (36.7 C) 98.5 F (36.9 C)  TempSrc: Oral Oral Oral Oral  SpO2: 97% 96% 98% 98%  Weight:      Height:        Intake/Output Summary (Last 24 hours) at 01/26/2020 1047 Last data filed at 01/26/2020 0900 Gross per 24 hour  Intake 440 ml  Output 50 ml  Net 390 ml   Filed Weights   01/22/20 2114 01/23/20 2022 01/25/20 0341  Weight: 56.7 kg 56.7 kg 53.6 kg    Exam: In bed, no acute distress Regular rate and rhythm Positive bowel sounds, soft, nontender No increased work of breathing Minimal lower extremity edema  Data Reviewed:   I have personally reviewed following labs and imaging studies:  Labs: Labs show the following:   Basic Metabolic Panel: Recent Labs  Lab 01/21/20 0410 01/21/20 0410 01/22/20 0806 01/22/20 0806 01/23/20 0534 01/23/20 0534 01/24/20 0501 01/24/20 0501 01/25/20 0416 01/26/20 0401  NA 137   < > 135  --  137  --  138  --  137 134*  K 3.9   < > 3.2*   < > 3.5   < > 4.3   < > 4.5 5.0  CL 100   < > 99  --  101  --  101  --  101 99  CO2 26   < > 22  --  24  --  18*  --  24 21*  GLUCOSE 84   < > 111*  --  77  --  72  --  103* 93  BUN 69*   < > 71*  --  72*  --  73*  --  78* 83*  CREATININE 5.85*   < > 6.30*  --  6.53*  --  6.55*  --  6.45* 6.28*  CALCIUM 6.6*   < > 6.7*  --  7.0*  --  7.4*  --  7.4* 7.5*  MG 1.1*  --  1.5*  --  2.1  --   --   --  1.6* 2.1  PHOS 2.7   < > 3.3  --  3.9  --  4.2  --  3.8 3.7   < > = values in this interval not displayed.    GFR Estimated Creatinine Clearance: 6.5 mL/min (A) (by C-G formula based on SCr of 6.28 mg/dL (H)). Liver Function Tests: Recent Labs  Lab 01/22/20 0806 01/23/20 0534 01/24/20 0501 01/25/20  9628 01/26/20 0401  ALBUMIN 1.7* 1.7* 1.8* 1.9* 1.9*   No results for input(s): LIPASE, AMYLASE in the last 168 hours. No results for input(s): AMMONIA in the last 168 hours. Coagulation profile No results for input(s): INR, PROTIME in the last 168 hours.  CBC: Recent Labs  Lab 01/20/20 0555 01/21/20 0410 01/22/20 0806 01/23/20 0534 01/25/20 0416  WBC 4.9 4.5 5.4 3.5* 4.5  NEUTROABS 3.5 3.0 3.9 2.1 2.7  HGB 7.6* 7.9* 7.4* 7.5* 7.4*  HCT 23.6* 24.4* 23.0* 23.5* 23.5*  MCV 90.8 91.4 91.3 91.4 92.2  PLT 185 218 223 220 256   Cardiac Enzymes: No results for input(s): CKTOTAL, CKMB, CKMBINDEX, TROPONINI in the last 168 hours. BNP (last 3 results) No results for input(s): PROBNP in the last 8760 hours. CBG: Recent Labs  Lab 01/24/20 2109 01/25/20 0619 01/25/20 1103 01/25/20 2056 01/26/20 0709  GLUCAP 59* 101* 274* 130* 86   D-Dimer: No results for input(s): DDIMER in the last 72 hours. Hgb A1c: No results for input(s): HGBA1C in the last 72 hours. Lipid Profile: No results for input(s): CHOL, HDL, LDLCALC, TRIG, CHOLHDL, LDLDIRECT in the last 72 hours. Thyroid function studies: No results for input(s): TSH, T4TOTAL, T3FREE, THYROIDAB in the last 72 hours.  Invalid input(s): FREET3 Anemia work up: No results for input(s): VITAMINB12, FOLATE, FERRITIN, TIBC, IRON, RETICCTPCT in the last 72 hours. Sepsis Labs: Recent Labs  Lab 01/21/20 0410 01/22/20 0806 01/23/20 0534 01/25/20 0416  WBC 4.5 5.4 3.5* 4.5    Microbiology No results found for this or any previous visit (from the past 240 hour(s)).  Procedures and diagnostic studies:  No results found.  Medications:   . amLODipine  5 mg Oral Daily  . feeding supplement (ENSURE ENLIVE)  237 mL Oral BID BM  .  heparin  5,000 Units Subcutaneous Q8H  . insulin aspart  0-5 Units Subcutaneous QHS  . insulin aspart  0-6 Units Subcutaneous TID WC  . mouth rinse  15 mL Mouth Rinse BID  . polyethylene glycol  17 g Oral BID  . senna  2 tablet Oral BID  . sodium bicarbonate  1,300 mg Oral BID   Continuous Infusions: . sodium chloride Stopped (01/13/20 0735)     LOS: 18 days   Geradine Girt  [facility]   How to contact the [facility] Attending or Consulting provider [place] or covering provider during after hours [place], for this patient?  1. Check the care team in [facility] and look for a) attending/consulting TR[facility] provider listed and b) the [facility] team listed 2. Log into [email or web address] and use [place]'s universal password to access. If you do not have the password, please contact the hospital operator. 3. Locate the [facility] provider you are looking for under [facility] and page to a number that you can be directly reached. 4. If you still have difficulty reaching the provider, please page the [facility] (Director on Call) for the Hospitalists listed on amion for assistance.  01/26/2020, 10:47 AM

## 2020-01-26 NOTE — Progress Notes (Signed)
Inpatient Diabetes Program Recommendations  AACE/ADA: New Consensus Statement on Inpatient Glycemic Control (2015)  Target Ranges:  Prepandial:   less than 140 mg/dL      Peak postprandial:   less than 180 mg/dL (1-2 hours)      Critically ill patients:  140 - 180 mg/dL   Lab Results  Component Value Date   GLUCAP 86 01/26/2020   HGBA1C 8.5 (H) 01/08/2020    Review of Glycemic Control  Results for Cindy, Burns (MRN [id number]) as of 01/26/2020 11:33  Ref. Range 01/24/2020 21:09 01/25/2020 06:19 01/25/2020 11:03 01/25/2020 20:56 01/26/2020 07:09  Glucose-Capillary Latest Ref Range: 70 - 99 mg/dL 59 (L) 101 (H) 274 (H) 130 (H) 86   Results for Cindy, Burns (MRN [id number]) as of 01/26/2020 11:33  Ref. Range 07/29/2019 09:48 01/08/2020 23:45  Hemoglobin A1C Latest Ref Range: 4.8 - 5.6 % 6.8 (H) 8.5 (H)   Diabetes history: DM2  Outpatient Diabetes medications: januvia 100 mg QD, Glipzide XL 10 mg QD (Stopped taking in February)  Current orders for Inpatient glycemic control: Novolog 0-6 units tidwc, 0-5 qhs  Inpatient Diabetes Program Recommendations:     Please consider discharging on Januvia 100 mg qd and glipizide XL 10 mg qd   Note:  Inpatient diabetes referral for new to insulin.  Spoke with patient at bedside.  Patient lives alone and is independent.  She states she stopped taking her oral diabetes medications in February as she felt she was doing "alright without them".  She could not remember her DM medications so we called [facility] in [facility] and the pharmacist confirmed above home medications for DM control.   Her A1C was 6.2% at her last PCP visit.  She sees Dr. Josefa Half at [facility].  She does drink regular Coke and water.  She does not watch her CHO intake.  Reviewed patient's current A1c of 8.5%. Explained what a A1c is and what it measures. Also reviewed goal A1c with patient, importance of good glucose control @ home, and blood sugar goals.  With diet adjustments  and oral medications she should see an improvement with her blood sugars.  She has a meter at home and I encouraged her to check her CBG before breakfast and in the afternoon a couple of hours after lunch.  She is to bring that meter to her follow up appointment for PCP to review.  Reviewed The Plate Method and importance of eliminating sugary drinks.  She states she will start taking her oral medications again.  Sent Eulogio Bear a secure chat about conversation with patient.  We reviewed hypoglycemia signs and symptoms and treatment.    Thank you, Reche Dixon, RN, BSN Diabetes Coordinator Inpatient Diabetes Program [phone] (team pager from 8a-5p)

## 2020-01-26 NOTE — Progress Notes (Signed)
Physical Therapy Treatment Patient Details Name: Cindy Burns MRN: [id number] DOB: 07/03/1946 Today's Date: 01/26/2020    History of Present Illness Pt is a 74 year old woman admitted on 01/08/20 to [facility] and subsequently transferred to [facility] with respiratory failure requring intubation, DKA, UTI, hyperkalemia and renal failure requiring CRRT. Extubated 01/11/20. PMH: COPD, breast cancer, Dm, MDD.    PT Comments    Continuing work on functional mobility and activity tolerance;  Making steady improvements in amb distance, and performed stairs today; noted dc plan has changed to home with [facility] services; Im curious if and what kind of family/friends' support and assist that she has at home; Appreciate SW work on looking into [employer] options   Follow Up Recommendations  Home health PT;Supervision - Intermittent     Equipment Recommendations  Rolling walker with 5" wheels;3in1 (PT)    Recommendations for Other Services       Precautions / Restrictions Precautions Precautions: Fall Precaution Comments: L foot clearance difficulty; much better with AFO Required Braces or Orthoses: Other Brace Other Brace: AFO Restrictions Weight Bearing Restrictions: No    Mobility  Bed Mobility   Bed Mobility: Supine to Sit     Supine to sit: Modified independent (Device/Increase time)        Transfers Overall transfer level: Needs assistance Equipment used: Rolling walker (2 wheeled) Transfers: Sit to/from Stand Sit to Stand: Supervision         General transfer comment: Cues for hand placement, no need for assist to rise  Ambulation/Gait Ambulation/Gait assistance: Min guard(without physical contact) Gait Distance (Feet): 130 Feet Assistive device: Rolling walker (2 wheeled) Gait Pattern/deviations: Step-through pattern     General Gait Details: No difficulty with toe drag using her AFO   Stairs Stairs: Yes Stairs assistance: Min guard Stair Management: One rail Right;Forwards;Step  to pattern Number of Stairs: 4 General stair comments: minguard for safety   Wheelchair Mobility    Modified Rankin (Stroke Patients Only)       Balance     Sitting balance-Leahy Scale: Good       Standing balance-Leahy Scale: Fair                              Cognition Arousal/Alertness: Awake/alert Behavior During Therapy: WFL for tasks assessed/performed Overall Cognitive Status: No family/caregiver present to determine baseline cognitive functioning                                 General Comments: When asked if there is any family or friends to help her at home, she answered no; later, she mentioned a freind is taking care of her dogs -- when asked if he could help her as well, she said yes      Exercises      General Comments        Pertinent Vitals/Pain Pain Assessment: No/denies pain Pain Intervention(s): Monitored during session    Home Living                      Prior Function            PT Goals (current goals can now be found in the care plan section) Acute Rehab PT Goals Patient Stated Goal: to get betterand go home to her puppies PT Goal Formulation: With patient Time For Goal Achievement: 02/09/20 Potential to Achieve  Goals: Good Progress towards PT goals: Goals met and updated - see care plan    Frequency    Min 3X/week      PT Plan Discharge plan needs to be updated    Co-evaluation              AM-PAC PT "6 Clicks" Mobility   Outcome Measure  Help needed turning from your back to your side while in a flat bed without using bedrails?: None Help needed moving from lying on your back to sitting on the side of a flat bed without using bedrails?: None Help needed moving to and from a bed to a chair (including a wheelchair)?: None Help needed standing up from a chair using your arms (e.g., wheelchair or bedside chair)?: A Little Help needed to walk in hospital room?: A Little Help needed  climbing 3-5 steps with a railing? : A Little 6 Click Score: 21    End of Session Equipment Utilized During Treatment: Gait belt(AFO) Activity Tolerance: Patient tolerated treatment well Patient left: in chair;with call bell/phone within reach;with chair alarm set Nurse Communication: Mobility status PT Visit Diagnosis: Other abnormalities of gait and mobility (R26.89);Unsteadiness on feet (R26.81);Muscle weakness (generalized) (M62.81)     Time: 9311-2162 PT Time Calculation (min) (ACUTE ONLY): 26 min  Charges:  $Gait Training: 23-37 mins                     Roney Marion, [place]  Acute Rehabilitation Services Pager [phone] Office [phone]    Colletta [place] 01/26/2020, 3:34 PM

## 2020-01-26 NOTE — Progress Notes (Signed)
Admit: 01/08/2020 LOS: 89  69F AKI from ATN req CRRT through 4/6 now following for recovery of GFR / HD  Subjective:  . Again, slight improvement in SCR . HD cath removed . Working with PT, standing with walker . UOP not well quantified . Still good PO, no N/V  04/20 0701 - 04/21 0700 In: 540 [P.O.:540] Out: 50 [Urine:50]  Filed Weights   01/22/20 2114 01/23/20 2022 01/25/20 0341  Weight: 56.7 kg 56.7 kg 53.6 kg    Scheduled Meds: . amLODipine  5 mg Oral Daily  . feeding supplement (ENSURE ENLIVE)  237 mL Oral BID BM  . heparin  5,000 Units Subcutaneous Q8H  . insulin aspart  0-5 Units Subcutaneous QHS  . insulin aspart  0-6 Units Subcutaneous TID WC  . mouth rinse  15 mL Mouth Rinse BID  . polyethylene glycol  17 g Oral BID  . senna  2 tablet Oral BID  . sodium bicarbonate  1,300 mg Oral BID   Continuous Infusions: . sodium chloride Stopped (01/13/20 0735)   PRN Meds:.sodium chloride, dextrose, ipratropium-albuterol, labetalol  Current Labs: reviewed    Physical Exam:  Blood pressure (!) 144/59, pulse 84, temperature 98.7 F (37.1 C), temperature source Oral, resp. rate 16, height 5\' 2"  (1.575 m), weight 53.6 kg, SpO2 98 %. NAD, I nchair Regular, no rub CTAB No sig LEE L IJ Temp HD cath bandaged S/nt/nd  A 1. AKI, nl baseline; likely ATN from hypovolemia/hemodynamic; stable low GFR, UOP improving and suggestive of GFR recovery; SCr slightly improved today, not overtly uremic right now 2. Anemia; Hb stable 7s; ASx 3. HTN BP stable; amlodipine 4. Acidosis on NaHCO3, stable HCO3 5. DKA resolved 6. LEE 7. DM2 8. COPD 9. Hypomag 10. L IJ Temp HD cath present  P . Stable, cont supportive care . Cont to trend labs / UOP . Daily weights, Daily Renal Panel, Strict I/Os, Avoid nephrotoxins (NSAIDs, judicious IV Contrast)    Pearson Grippe MD 01/26/2020, 1:24 PM  Recent Labs  Lab 01/24/20 0501 01/25/20 0416 01/26/20 0401  NA 138 137 134*  K 4.3 4.5 5.0   CL 101 101 99  CO2 18* 24 21*  GLUCOSE 72 103* 93  BUN 73* 78* 83*  CREATININE 6.55* 6.45* 6.28*  CALCIUM 7.4* 7.4* 7.5*  PHOS 4.2 3.8 3.7   Recent Labs  Lab 01/22/20 0806 01/23/20 0534 01/25/20 0416  WBC 5.4 3.5* 4.5  NEUTROABS 3.9 2.1 2.7  HGB 7.4* 7.5* 7.4*  HCT 23.0* 23.5* 23.5*  MCV 91.3 91.4 92.2  PLT 223 220 256

## 2020-01-26 NOTE — Progress Notes (Signed)
Nutrition Follow-up  DOCUMENTATION CODES:   Not applicable  INTERVENTION:   Continue Ensure Enlive po BID, each supplement provides 350 kcal and 20 grams of protein  D/C Magic Cup  Add MVI with Minerals   NUTRITION DIAGNOSIS:   Inadequate oral intake related to inability to eat as evidenced by NPO status.  Being addressed via supplements  GOAL:   Patient will meet greater than or equal to 90% of their needs  Progressing  MONITOR:   PO intake, Supplement acceptance, Skin  REASON FOR ASSESSMENT:   Ventilator, Consult Enteral/tube feeding initiation and management  ASSESSMENT:   74 yo female admitted with respiratory failure, DKA, UTI, AKI. Intubated on admission. PMH includes COPD, DM, breast cancer.  Pt reports appetite improving; eating at least 50% of meal trays. Recorded po intake 25-75% of meals. Pt reports she loves the Ensure and drinking well. Pt does not like the Magic Cups and not eating.   Current weight 53.6 kg; admission weight 52.9 kg.   Labs: CBGs 59-305, sodium 134 Meds: ss novolog, sodium bicarbonate, miralax daily   Diet Order:   Diet Order            Diet Carb Modified Fluid consistency: Thin; Room service appropriate? Yes with Assist  Diet effective now              EDUCATION NEEDS:   Not appropriate for education at this time  Skin:  Skin Assessment: Skin Integrity Issues: Skin Integrity Issues:: DTI, Stage II, Other (Comment) DTI: L heel Stage II: coccyx Other: wound to L great toe; MASD to groin, perineum, buttocks  Last BM:  4/14  Height:   Ht Readings from Last 1 Encounters:  01/17/20 5\' 2"  (1.575 m)    Weight:   Wt Readings from Last 1 Encounters:  01/25/20 53.6 kg    Ideal Body Weight:  50 kg  BMI:  Body mass index is 21.6 kg/m.  Estimated Nutritional Needs:   Kcal:  1400-1600  Protein:  80-90 gm  Fluid:  >/= 1.5 L   Kerman Passey MS, RDN, LDN, CNSC RD Pager Number and Weekend/On-Call After Hours  Pager Located in [place]

## 2020-01-27 DIAGNOSIS — I10 Essential (primary) hypertension: Secondary | ICD-10-CM | POA: Diagnosis not present

## 2020-01-27 DIAGNOSIS — N179 Acute kidney failure, unspecified: Secondary | ICD-10-CM | POA: Diagnosis not present

## 2020-01-27 DIAGNOSIS — F17200 Nicotine dependence, unspecified, uncomplicated: Secondary | ICD-10-CM | POA: Diagnosis not present

## 2020-01-27 LAB — RENAL FUNCTION PANEL
Albumin: 2 g/dL — ABNORMAL LOW (ref 3.5–5.0)
Anion gap: 13 (ref 5–15)
BUN: 87 mg/dL — ABNORMAL HIGH (ref 8–23)
CO2: 24 mmol/L (ref 22–32)
Calcium: 7.9 mg/dL — ABNORMAL LOW (ref 8.9–10.3)
Chloride: 98 mmol/L (ref 98–111)
Creatinine, Ser: 6.69 mg/dL — ABNORMAL HIGH (ref 0.44–1.00)
GFR calc Af Amer: 7 mL/min — ABNORMAL LOW (ref >60)
GFR calc non Af Amer: 6 mL/min — ABNORMAL LOW (ref >60)
Glucose, Bld: 118 mg/dL — ABNORMAL HIGH (ref 70–99)
Phosphorus: 3.3 mg/dL (ref 2.5–4.6)
Potassium: 5.2 mmol/L — ABNORMAL HIGH (ref 3.5–5.1)
Sodium: 135 mmol/L (ref 135–145)

## 2020-01-27 LAB — GLUCOSE, CAPILLARY
Glucose-Capillary: 100 mg/dL — ABNORMAL HIGH (ref 70–99)
Glucose-Capillary: 233 mg/dL — ABNORMAL HIGH (ref 70–99)
Glucose-Capillary: 153 mg/dL — ABNORMAL HIGH (ref 70–99)
Glucose-Capillary: 165 mg/dL — ABNORMAL HIGH (ref 70–99)

## 2020-01-27 MED ORDER — DARBEPOETIN ALFA 60 MCG/0.3ML IJ SOSY
60.0000 ug | PREFILLED_SYRINGE | INTRAMUSCULAR | Status: DC
  Administered 2020-01-27: 60 ug via SUBCUTANEOUS
  Filled 2020-01-27: qty 0.3

## 2020-01-27 NOTE — Plan of Care (Signed)
  Problem: Activity: Goal: Risk for activity intolerance will decrease Outcome: Progressing   

## 2020-01-27 NOTE — Progress Notes (Signed)
Admit: 01/08/2020 LOS: 32  61F AKI from ATN req CRRT through 4/6 now following for recovery of GFR / HD  Subjective:  . Slight worsening in SCr . K and HCO3 ok . Good UOP . Pt w/o N/V/anorexia, feels good  04/21 0701 - 04/22 0700 In: 684 [P.O.:684] Out: 1650 [Urine:1650]  Filed Weights   01/23/20 2022 01/25/20 0341 01/26/20 2115  Weight: 56.7 kg 53.6 kg 53.6 kg    Scheduled Meds: . amLODipine  5 mg Oral Daily  . feeding supplement (ENSURE ENLIVE)  237 mL Oral BID BM  . heparin  5,000 Units Subcutaneous Q8H  . insulin aspart  0-5 Units Subcutaneous QHS  . insulin aspart  0-6 Units Subcutaneous TID WC  . mouth rinse  15 mL Mouth Rinse BID  . multivitamin with minerals  1 tablet Oral Daily  . polyethylene glycol  17 g Oral BID  . senna  2 tablet Oral BID  . sodium bicarbonate  1,300 mg Oral BID   Continuous Infusions: . sodium chloride Stopped (01/13/20 0735)   PRN Meds:.sodium chloride, dextrose, ipratropium-albuterol, labetalol  Current Labs: reviewed    Physical Exam:  Blood pressure (!) 138/52, pulse 78, temperature (!) 97.3 F (36.3 C), temperature source Oral, resp. rate (!) 22, height 5\' 2"  (1.575 m), weight 53.6 kg, SpO2 96 %. NAD, I nchair Regular, no rub CTAB No sig LEE L IJ Temp HD cath bandaged S/nt/nd  A 1. AKI, nl baseline; likely ATN from hypovolemia/hemodynamic; stable low GFR, UOP improving and suggestive of GFR recovery; No uremia, Cont expectant management. No HD needed 2. Anemia; Hb stable 7s; ASx; Check Fe levels, start ESA 3. HTN BP stable; amlodipine 4. Acidosis on NaHCO3, stable HCO3 5. DKA resolved 6. LEE 7. DM2 8. COPD 9. Hypomag 10. L IJ Temp HD cath present  P . Stable, cont supportive care . Check Fe levels, start ESA . Cont to trend labs / UOP . Daily weights, Daily Renal Panel, Strict I/Os, Avoid nephrotoxins (NSAIDs, judicious IV Contrast)    Pearson Grippe MD 01/27/2020, 11:25 AM  Recent Labs  Lab 01/25/20 0416  01/26/20 0401 01/27/20 0334  NA 137 134* 135  K 4.5 5.0 5.2*  CL 101 99 98  CO2 24 21* 24  GLUCOSE 103* 93 118*  BUN 78* 83* 87*  CREATININE 6.45* 6.28* 6.69*  CALCIUM 7.4* 7.5* 7.9*  PHOS 3.8 3.7 3.3   Recent Labs  Lab 01/22/20 0806 01/23/20 0534 01/25/20 0416  WBC 5.4 3.5* 4.5  NEUTROABS 3.9 2.1 2.7  HGB 7.4* 7.5* 7.4*  HCT 23.0* 23.5* 23.5*  MCV 91.3 91.4 92.2  PLT 223 220 256

## 2020-01-27 NOTE — Progress Notes (Signed)
Physical Therapy Treatment Patient Details Name: Cindy Burns MRN: [id number] DOB: [birth year] Today's Date: 01/27/2020    History of Present Illness Pt is a 74 year old woman admitted on 01/08/20 to [facility] and subsequently transferred to [facility] with respiratory failure requring intubation, DKA, UTI, hyperkalemia and renal failure requiring CRRT. Extubated 01/11/20. PMH: COPD, breast cancer, Dm, MDD.    PT Comments    Pt making good progress with endurance and safety.  Needs min cues for safe gait and transfer techniques.  Good foot clearance with AFO.  Cont POC.    Follow Up Recommendations  Home health PT;Supervision - Intermittent     Equipment Recommendations  Rolling walker with 5" wheels;3in1 (PT)    Recommendations for Other Services       Precautions / Restrictions Precautions Precautions: Fall Required Braces or Orthoses: Other Brace Other Brace: AFO    Mobility  Bed Mobility Overal bed mobility: Needs Assistance Bed Mobility: Supine to Sit     Supine to sit: Modified independent (Device/Increase time)        Transfers Overall transfer level: Needs assistance Equipment used: Rolling walker (2 wheeled) Transfers: Sit to/from Stand Sit to Stand: Supervision         General transfer comment: Cues for hand placement, no need for assist to rise ; performed x 2 for transfers and 10x for exercise  Ambulation/Gait Ambulation/Gait assistance: Supervision Gait Distance (Feet): 300 Feet Assistive device: Rolling walker (2 wheeled) Gait Pattern/deviations: Step-through pattern     General Gait Details: Good foot clearance with AFO; did require assist to don shoes due to tight with hospital socks; min cues for posture   Stairs             Wheelchair Mobility    Modified Rankin (Stroke Patients Only)       Balance Overall balance assessment: Needs assistance Sitting-balance support: No upper extremity supported;Feet supported Sitting balance-Leahy  Scale: Good     Standing balance support: Bilateral upper extremity supported;During functional activity Standing balance-Leahy Scale: Fair Standing balance comment: reliant on UE support                            Cognition Arousal/Alertness: Awake/alert Behavior During Therapy: WFL for tasks assessed/performed Overall Cognitive Status: Within Functional Limits for tasks assessed                                        Exercises      General Comments General comments (skin integrity, edema, etc.): VSS      Pertinent Vitals/Pain Pain Assessment: No/denies pain    Home Living                      Prior Function            PT Goals (current goals can now be found in the care plan section) Progress towards PT goals: Progressing toward goals    Frequency    Min 3X/week      PT Plan Current plan remains appropriate    Co-evaluation              AM-PAC PT "6 Clicks" Mobility   Outcome Measure  Help needed turning from your back to your side while in a flat bed without using bedrails?: None Help needed moving from lying on your back to  sitting on the side of a flat bed without using bedrails?: None Help needed moving to and from a bed to a chair (including a wheelchair)?: None Help needed standing up from a chair using your arms (e.g., wheelchair or bedside chair)?: None Help needed to walk in hospital room?: None Help needed climbing 3-5 steps with a railing? : A Little 6 Click Score: 23    End of Session Equipment Utilized During Treatment: Gait belt Activity Tolerance: Patient tolerated treatment well Patient left: in chair;with call bell/phone within reach;with chair alarm set Nurse Communication: Mobility status PT Visit Diagnosis: Other abnormalities of gait and mobility (R26.89);Unsteadiness on feet (R26.81);Muscle weakness (generalized) (M62.81)     Time: 1694-5038 PT Time Calculation (min) (ACUTE ONLY): 20  min  Charges:  $Gait Training: 8-22 mins                     Maggie Font, PT Acute Rehab Services Pager [phone] [facility] [phone]    Karlton Lemon 01/27/2020, 3:46 PM

## 2020-01-27 NOTE — Plan of Care (Signed)
  Problem: Activity: Goal: Risk for activity intolerance will decrease 01/27/2020 1240 by Clint Bolder, RN Outcome: Progressing 01/27/2020 1008 by Clint Bolder, RN Outcome: Progressing

## 2020-01-27 NOTE — Progress Notes (Addendum)
Progress Note    Cindy Burns  INO:[id number] DOB: Dec 06, 1945  DOA: 01/08/2020 PCP: [name], MD    Brief Narrative:     Medical records reviewed and are as summarized below:  Cindy Burns is an 74 y.o. female with a past medical history significant for breast cancer, COPD, tobacco use, diabetes, MDD admitted April 3 with acute renal failure requiring CRRT, hyperkalemia, anion gap metabolic acidosis secondary to DKA, acute respiratory failure requiring intubation extubated 01/11/2020.    Await renal function recovery  Assessment/Plan:   Principal Problem:   Renal failure Active Problems:   CIGARETTE SMOKER   Hypertension   Pressure injury of skin   Encounter for central line placement   Pain and swelling of ankle, left   Hypocalcemia   Metabolic acidosis   Acute respiratory failure with hypoxia (HCC)   UTI (urinary tract infection)   Acute renal failure secondary to severe volume depletion in the setting of DKA/UTI/metabolic acidosis.  - Evaluated by nephrology who opine no GFR recovery yet, no uremia and no strong reason for hemodialysis. -Defer management to nephrology -Temp HD catheter removed on 4/20  Acute respiratory failure with hypoxia in the setting of COPD and former tobacco use.  Patient intubated on admission.  Extubated 2 days later.  Oxygen saturation levels greater than 90% on room air.  Her last cigarette was 2 days before she was admitted. -Nebulizers -Incentive spirometry -Mobilize  Hypokalemia/hypocalcemia/hypomagnesemia. -Replete   Urinary tract infection.  Pansensitive E. coli noted on culture.   -Blood cultures with no growth to date.   -completed 7-day course of Rocephin   Normocytic anemia.  Hemoglobin remaining stable.  No sign symptoms of bleeding. -Monitor   Diabetes type 2.  Uncontrolled.  Hemoglobin A1c 8.5.  -Resume home medicines upon discharge   left foot drop/pain and swelling of the left ankle.  - X-rays left  ankle negative.  MRI lumbar spine reveals mild disc bulging at L3 and 4, leftward disc protrusion with annular tear, rightward disc protrusion and annular tear at L5-S1, moderate facet hypertrophy at L5-S1.  Evaluated by neurosurgery who opined no immediate intervention recommended outpatient follow-up with possible EMG -AFO/dorsiflexion assist per PT  Pressure Injury 01/08/20 Coccyx Medial Stage 2 -  Partial thickness loss of dermis presenting as a shallow open injury with a red, pink wound bed without slough. small area of broken skin on coccyx (Active)  01/08/20 2200  Location: Coccyx  Location Orientation: Medial  Staging: Stage 2 -  Partial thickness loss of dermis presenting as a shallow open injury with a red, pink wound bed without slough.  Wound Description (Comments): small area of broken skin on coccyx  Present on Admission: Yes     Pressure Injury 01/09/20 Heel Left Deep Tissue Pressure Injury - Purple or maroon localized area of discolored intact skin or blood-filled blister due to damage of underlying soft tissue from pressure and/or shear. purplr color with red circle around it (Active)  01/09/20 1000  Location: Heel  Location Orientation: Left  Staging: Deep Tissue Pressure Injury - Purple or maroon localized area of discolored intact skin or blood-filled blister due to damage of underlying soft tissue from pressure and/or shear.  Wound Description (Comments): purplr color with red circle around it  Present on Admission: Yes       Family Communication/Anticipated D/C date and plan/Code Status   DVT prophylaxis: Heparin Code Status: Full Code.  Disposition Plan: Status is: Inpatient  Remains inpatient  appropriate because:Inpatient level of care appropriate due to severity of illness   Dispo: The patient is from: Home              Anticipated d/c is to: Home              Anticipated d/c date is: 1 day              Patient currently is not medically stable to d/c.-  await return of renal function          Medical Consultants:    Renal  PCCM     Subjective:   Eating, no current complaints  Objective:    Vitals:   01/26/20 1643 01/26/20 2115 01/27/20 0445 01/27/20 0930  BP: (!) 138/53 (!) 137/57 (!) 133/58 (!) 138/52  Pulse: 82 78 77 78  Resp: 16 17 17  (!) 22  Temp: 98.9 F (37.2 C) 98.7 F (37.1 C) 97.9 F (36.6 C) (!) 97.3 F (36.3 C)  TempSrc: Oral Oral Oral Oral  SpO2: 99% 95% 94% 96%  Weight:  53.6 kg    Height:        Intake/Output Summary (Last 24 hours) at 01/27/2020 1213 Last data filed at 01/27/2020 0800 Gross per 24 hour  Intake 804 ml  Output 1501 ml  Net -697 ml   Filed Weights   01/23/20 2022 01/25/20 0341 01/26/20 2115  Weight: 56.7 kg 53.6 kg 53.6 kg    Exam: Sitting on side of bed, no acute distress Regular rate and rhythm No increased work of breathing Pleasant and cooperative  Data Reviewed:   I have personally reviewed following labs and imaging studies:  Labs: Labs show the following:   Basic Metabolic Panel: Recent Labs  Lab 01/21/20 0410 01/21/20 0410 01/22/20 0806 01/22/20 0806 01/23/20 0534 01/23/20 0534 01/24/20 0501 01/24/20 0501 01/25/20 0416 01/25/20 0416 01/26/20 0401 01/27/20 0334  NA 137   < > 135   < > 137  --  138  --  137  --  134* 135  K 3.9   < > 3.2*   < > 3.5   < > 4.3   < > 4.5   < > 5.0 5.2*  CL 100   < > 99   < > 101  --  101  --  101  --  99 98  CO2 26   < > 22   < > 24  --  18*  --  24  --  21* 24  GLUCOSE 84   < > 111*   < > 77  --  72  --  103*  --  93 118*  BUN 69*   < > 71*   < > 72*  --  73*  --  78*  --  83* 87*  CREATININE 5.85*   < > 6.30*   < > 6.53*  --  6.55*  --  6.45*  --  6.28* 6.69*  CALCIUM 6.6*   < > 6.7*   < > 7.0*  --  7.4*  --  7.4*  --  7.5* 7.9*  MG 1.1*  --  1.5*  --  2.1  --   --   --  1.6*  --  2.1  --   PHOS 2.7   < > 3.3   < > 3.9  --  4.2  --  3.8  --  3.7 3.3   < > = values in this interval not displayed.  GFR Estimated Creatinine Clearance: 6.1 mL/min (A) (by C-G formula based on SCr of 6.69 mg/dL (H)). Liver Function Tests: Recent Labs  Lab 01/23/20 0534 01/24/20 0501 01/25/20 0416 01/26/20 0401 01/27/20 0334  ALBUMIN 1.7* 1.8* 1.9* 1.9* 2.0*   No results for input(s): LIPASE, AMYLASE in the last 168 hours. No results for input(s): AMMONIA in the last 168 hours. Coagulation profile No results for input(s): INR, PROTIME in the last 168 hours.  CBC: Recent Labs  Lab 01/21/20 0410 01/22/20 0806 01/23/20 0534 01/25/20 0416  WBC 4.5 5.4 3.5* 4.5  NEUTROABS 3.0 3.9 2.1 2.7  HGB 7.9* 7.4* 7.5* 7.4*  HCT 24.4* 23.0* 23.5* 23.5*  MCV 91.4 91.3 91.4 92.2  PLT 218 223 220 256   Cardiac Enzymes: No results for input(s): CKTOTAL, CKMB, CKMBINDEX, TROPONINI in the last 168 hours. BNP (last 3 results) No results for input(s): PROBNP in the last 8760 hours. CBG: Recent Labs  Lab 01/26/20 1120 01/26/20 1636 01/26/20 2115 01/27/20 0655 01/27/20 1114  GLUCAP 305* 217* 110* 100* 233*   D-Dimer: No results for input(s): DDIMER in the last 72 hours. Hgb A1c: No results for input(s): HGBA1C in the last 72 hours. Lipid Profile: No results for input(s): CHOL, HDL, LDLCALC, TRIG, CHOLHDL, LDLDIRECT in the last 72 hours. Thyroid function studies: No results for input(s): TSH, T4TOTAL, T3FREE, THYROIDAB in the last 72 hours.  Invalid input(s): FREET3 Anemia work up: No results for input(s): VITAMINB12, FOLATE, FERRITIN, TIBC, IRON, RETICCTPCT in the last 72 hours. Sepsis Labs: Recent Labs  Lab 01/21/20 0410 01/22/20 0806 01/23/20 0534 01/25/20 0416  WBC 4.5 5.4 3.5* 4.5    Microbiology No results found for this or any previous visit (from the past 240 hour(s)).  Procedures and diagnostic studies:  No results found.  Medications:   . amLODipine  5 mg Oral Daily  . darbepoetin (ARANESP) injection - NON-DIALYSIS  60 mcg Subcutaneous Q Thu-1800  . feeding supplement  (ENSURE ENLIVE)  237 mL Oral BID BM  . heparin  5,000 Units Subcutaneous Q8H  . insulin aspart  0-5 Units Subcutaneous QHS  . insulin aspart  0-6 Units Subcutaneous TID WC  . mouth rinse  15 mL Mouth Rinse BID  . multivitamin with minerals  1 tablet Oral Daily  . polyethylene glycol  17 g Oral BID  . senna  2 tablet Oral BID  . sodium bicarbonate  1,300 mg Oral BID   Continuous Infusions: . sodium chloride Stopped (01/13/20 0735)     LOS: 19 days   Geradine Girt  [facility]   How to contact the [facility] Attending or Consulting provider [place] or covering provider during after hours [place], for this patient?  1. Check the care team in [facility] and look for a) attending/consulting TRH provider listed and b) the [facility] team listed 2. Log into [email or web address] and use [place]'s universal password to access. If you do not have the password, please contact the hospital operator. 3. Locate the [facility] provider you are looking for under [facility] and page to a number that you can be directly reached. 4. If you still have difficulty reaching the provider, please page the [facility] (Director on Call) for the Hospitalists listed on amion for assistance.  01/27/2020, 12:13 PM

## 2020-01-27 NOTE — Progress Notes (Signed)
Inpatient Diabetes Program Recommendations  AACE/ADA: New Consensus Statement on Inpatient Glycemic Control  Target Ranges:  Prepandial:   less than 140 mg/dL      Peak postprandial:   less than 180 mg/dL (1-2 hours)      Critically ill patients:  140 - 180 mg/dL   Results for Cindy Burns, Cindy Burns (MRN [id number]) as of 01/27/2020 10:15  Ref. Range 01/26/2020 07:09 01/26/2020 11:20 01/26/2020 16:36 01/26/2020 21:15 01/27/2020 06:55  Glucose-Capillary Latest Ref Range: 70 - 99 mg/dL 86 305 (H) 217 (H) 110 (H) 100 (H)  Results for Cindy Burns, Cindy Burns (MRN [id number]) as of 01/27/2020 10:15  Ref. Range 07/29/2019 09:48 01/08/2020 23:45  Hemoglobin A1C Latest Ref Range: 4.8 - 5.6 % 6.8 (H) 8.5 (H)   Review of Glycemic Control  Diabetes history: DM2 Outpatient Diabetes medications: Januvia 100 mg daily, Glipzide XL 10 mg daily (Stopped taking both in February) Current orders for Inpatient glycemic control: Novolog 0-6 units TID with meals, Novolog 0-5 QHS  Inpatient Diabetes Program Recommendations:     Insulin-Meal Coverage: While inpatient, please consider ordering Novolog 3 units TID with meals if patient eats at least 50% of meals.  A1C: A1C 8.5% on 01/08/20. Patient reported to Diabetes Coordinator on 01/26/20 that she stopped taking oral DM medications in February. Recommend restarting patient on oral DM medications at time of discharge. Patient had been taking Januvia 100 mg daily and Glipizide XL 10 mg daily.  Thanks, Barnie Alderman, RN, MSN, CDE Diabetes Coordinator Inpatient Diabetes Program [phone] (Team Pager from 8am to 5pm)

## 2020-01-28 DIAGNOSIS — F17200 Nicotine dependence, unspecified, uncomplicated: Secondary | ICD-10-CM | POA: Diagnosis not present

## 2020-01-28 DIAGNOSIS — N179 Acute kidney failure, unspecified: Secondary | ICD-10-CM | POA: Diagnosis not present

## 2020-01-28 DIAGNOSIS — I10 Essential (primary) hypertension: Secondary | ICD-10-CM | POA: Diagnosis not present

## 2020-01-28 LAB — CBC
HCT: 23.4 % — ABNORMAL LOW (ref 36.0–46.0)
Hemoglobin: 7.2 g/dL — ABNORMAL LOW (ref 12.0–15.0)
MCH: 29.1 pg (ref 26.0–34.0)
MCHC: 30.8 g/dL (ref 30.0–36.0)
MCV: 94.7 fL (ref 80.0–100.0)
Platelets: 256 10*3/uL (ref 150–400)
RBC: 2.47 MIL/uL — ABNORMAL LOW (ref 3.87–5.11)
RDW: 17.1 % — ABNORMAL HIGH (ref 11.5–15.5)
WBC: 4.6 10*3/uL (ref 4.0–10.5)
nRBC: 0 % (ref 0.0–0.2)

## 2020-01-28 LAB — GLUCOSE, CAPILLARY
Glucose-Capillary: 212 mg/dL — ABNORMAL HIGH (ref 70–99)
Glucose-Capillary: 107 mg/dL — ABNORMAL HIGH (ref 70–99)
Glucose-Capillary: 166 mg/dL — ABNORMAL HIGH (ref 70–99)
Glucose-Capillary: 101 mg/dL — ABNORMAL HIGH (ref 70–99)
Glucose-Capillary: 162 mg/dL — ABNORMAL HIGH (ref 70–99)

## 2020-01-28 LAB — RENAL FUNCTION PANEL
Albumin: 2 g/dL — ABNORMAL LOW (ref 3.5–5.0)
Anion gap: 12 (ref 5–15)
BUN: 90 mg/dL — ABNORMAL HIGH (ref 8–23)
CO2: 24 mmol/L (ref 22–32)
Calcium: 8.2 mg/dL — ABNORMAL LOW (ref 8.9–10.3)
Chloride: 100 mmol/L (ref 98–111)
Creatinine, Ser: 6.69 mg/dL — ABNORMAL HIGH (ref 0.44–1.00)
GFR calc Af Amer: 7 mL/min — ABNORMAL LOW (ref >60)
GFR calc non Af Amer: 6 mL/min — ABNORMAL LOW (ref >60)
Glucose, Bld: 105 mg/dL — ABNORMAL HIGH (ref 70–99)
Phosphorus: 3.4 mg/dL (ref 2.5–4.6)
Potassium: 5.3 mmol/L — ABNORMAL HIGH (ref 3.5–5.1)
Sodium: 136 mmol/L (ref 135–145)

## 2020-01-28 LAB — IRON AND TIBC
Iron: 84 ug/dL (ref 28–170)
Saturation Ratios: 41 % — ABNORMAL HIGH (ref 10.4–31.8)
TIBC: 204 ug/dL — ABNORMAL LOW (ref 250–450)
UIBC: 120 ug/dL

## 2020-01-28 LAB — FERRITIN: Ferritin: 418 ng/mL — ABNORMAL HIGH (ref 11–307)

## 2020-01-28 NOTE — Progress Notes (Signed)
Admit: 01/08/2020 LOS: 56  13F AKI from ATN req CRRT through 4/6 now following for recovery of GFR / HD  Subjective:  . Unchaged SCr . Several non-quantified voids . K 5.3 and HCO3 24 . Pt w/o N/V/anorexia, feels good  04/22 0701 - 04/23 0700 In: 960 [P.O.:960] Out: 2 [Stool:2]  Filed Weights   01/25/20 0341 01/26/20 2115 01/27/20 2200  Weight: 53.6 kg 53.6 kg 57.2 kg    Scheduled Meds: . amLODipine  5 mg Oral Daily  . darbepoetin (ARANESP) injection - NON-DIALYSIS  60 mcg Subcutaneous Q Thu-1800  . feeding supplement (ENSURE ENLIVE)  237 mL Oral BID BM  . heparin  5,000 Units Subcutaneous Q8H  . insulin aspart  0-5 Units Subcutaneous QHS  . insulin aspart  0-6 Units Subcutaneous TID WC  . mouth rinse  15 mL Mouth Rinse BID  . multivitamin with minerals  1 tablet Oral Daily  . polyethylene glycol  17 g Oral BID  . senna  2 tablet Oral BID  . sodium bicarbonate  1,300 mg Oral BID   Continuous Infusions: . sodium chloride Stopped (01/13/20 0735)   PRN Meds:.sodium chloride, dextrose, ipratropium-albuterol, labetalol  Current Labs: reviewed  Results for EUSTACIA, URBANEK (MRN [id number]) as of 01/28/2020 12:13  Ref. Range 01/28/2020 04:29  Saturation Ratios Latest Ref Range: 10.4 - 31.8 % 41 (H)    Physical Exam:  Blood pressure 137/73, pulse 78, temperature 98 F (36.7 C), temperature source Oral, resp. rate 18, height 5\' 2"  (1.575 m), weight 57.2 kg, SpO2 100 %. NAD, I nchair Regular, no rub CTAB No sig LEE L IJ Temp HD cath bandaged S/nt/nd  A 1. AKI, nl baseline; likely ATN from hypovolemia/hemodynamic; stable low GFR, UOP improving and suggestive of GFR recovery; No uremia, Cont expectant management. No HD needed 2. Anemia; Hb stable 7s;  1. ASx;  2. Fe replete,  3. ESA Aranesp 60 qThurs 3. HTN BP stable; amlodipine 4. Acidosis on NaHCO3, stable HCO3 5. DKA resolved 6. LEE 7. DM2 8. COPD 9. Hypomag  P . Stable, cont supportive care, unchanged SCr is  reason for hope . Cont to trend labs / UOP . Daily weights, Daily Renal Panel, Strict I/Os, Avoid nephrotoxins (NSAIDs, judicious IV Contrast)    Pearson Grippe MD 01/28/2020, 12:14 PM  Recent Labs  Lab 01/26/20 0401 01/27/20 0334 01/28/20 0429  NA 134* 135 136  K 5.0 5.2* 5.3*  CL 99 98 100  CO2 21* 24 24  GLUCOSE 93 118* 105*  BUN 83* 87* 90*  CREATININE 6.28* 6.69* 6.69*  CALCIUM 7.5* 7.9* 8.2*  PHOS 3.7 3.3 3.4   Recent Labs  Lab 01/22/20 0806 01/22/20 0806 01/23/20 0534 01/25/20 0416 01/28/20 0429  WBC 5.4   < > 3.5* 4.5 4.6  NEUTROABS 3.9  --  2.1 2.7  --   HGB 7.4*   < > 7.5* 7.4* 7.2*  HCT 23.0*   < > 23.5* 23.5* 23.4*  MCV 91.3   < > 91.4 92.2 94.7  PLT 223   < > 220 256 256   < > = values in this interval not displayed.

## 2020-01-28 NOTE — TOC Progression Note (Signed)
Transition of Care [facility]) - Progression Note    Patient Details  Name: Cindy Burns MRN: [id number] Date of Birth: September 11, 1946  Transition of Care [facility]) CM/SW Contact  Bartholomew Crews, RN Phone Number: [phone] 01/28/2020, 12:38 PM  Clinical Narrative:     Referral for home health PT and OT accepted by [facility]. Patient will need home health orders for PT and OT with Face to Face at time of discharge.   Referral for RW sent to [facility]. Patient will need DME order for RW.   TOC team following for transition needs.   Expected Discharge Plan: [place] Barriers to Discharge: Continued Medical Work up  Expected Discharge Plan and Services Expected Discharge Plan: [place] In-house Referral: Clinical Social Work Discharge Planning Services: CM Consult Post Acute Care Choice: Durable Medical Equipment, [place] arrangements for the past 2 months: Single Family Home                 DME Arranged: Gilford Rile DME Agency: [facility] Date DME Agency Contacted: 01/28/20 Time DME Agency Contacted: [phone] Representative spoke with at DME Agency: Gwinda Passe [place]: PT, OT [place] Agency: [place] Date [place]: 01/28/20 Time [place]: 1238 Representative spoke with at [place]: [place] ([place]) Interventions    Readmission Risk Interventions No flowsheet data found.

## 2020-01-28 NOTE — Progress Notes (Addendum)
Progress Note    Cindy GOETTE  GUR:[id number] DOB: 1946/02/10  DOA: 01/08/2020 PCP: [name], MD    Brief Narrative:    Medical records reviewed and are as summarized below:  Cindy Burns is an 74 y.o. female past medical history significant for breast cancer, COPD, tobacco use, diabetes, MDD admitted April 3 with acute renal failure requiring CRRT, hyperkalemia, anion gap metabolic acidosis secondary to DKA, acute respiratory failure requiring intubation extubated on April 6.  Await renal function recovery  Assessment/Plan:   Principal Problem:   Renal failure Active Problems:   Hypocalcemia   Metabolic acidosis   Acute respiratory failure with hypoxia (HCC)   CIGARETTE SMOKER   Hypertension   UTI (urinary tract infection)   Pressure injury of skin   Encounter for central line placement   Pain and swelling of ankle, left  Acute renal failure secondary to severe volume depletion in the setting of DKA/UTI/metabolic acidosis.  -Evaluated by nephrology who opine stable low GFR with good urine suggestive of GFR recovery are hopeful, no uremia and no strong reason for hemodialysis. -Defer management to nephrology -Temp HD catheter removed on 4/20 -continue to monitor   Acute respiratory failure with hypoxia in the setting of COPD and former tobacco use. Patient intubated on admission. Extubated 2 days later. Oxygen saturation levels greater than 90% on room air. Her last cigarette was 2 days before she was admitted. -Nebulizers -Incentive spirometry -Mobilize -she anticipates not resuming smoking at discharge  Hypokalemia/hypocalcemia/hypomagnesemia. Potassium trending back up today in setting of #1.  -monitor  Urinary tract infection. Pansensitive E. coli noted on culture.  -Blood cultures with no growth to date.  -completed 7-day course of Rocephin  Normocytic anemia. Hemoglobin remaining stable. No sign symptoms of  bleeding. -Monitor  Diabetes type 2. Uncontrolled. Hemoglobin A1c 8.5.  -Resume home medicines upon discharge   left foot drop/pain and swelling of the left ankle.  -X-rays left ankle negative. MRI lumbar spine reveals mild disc bulging at L3 and 4, leftward disc protrusion with annular tear, rightward disc protrusion and annular tear at L5-S1, moderate facet hypertrophy at L5-S1. Evaluated by neurosurgery who opined no immediate intervention recommended outpatient follow-up with possible EMG -AFO/dorsiflexion assist per PT  Pressure Injury 01/08/20 Coccyx Medial Stage 2 -  Partial thickness loss of dermis presenting as a shallow open injury with a red, pink wound bed without slough. small area of broken skin on coccyx (Active)  01/08/20 2200  Location: Coccyx  Location Orientation: Medial  Staging: Stage 2 -  Partial thickness loss of dermis presenting as a shallow open injury with a red, pink wound bed without slough.  Wound Description (Comments): small area of broken skin on coccyx  Present on Admission: Yes     Pressure Injury 01/09/20 Heel Left Deep Tissue Pressure Injury - Purple or maroon localized area of discolored intact skin or blood-filled blister due to damage of underlying soft tissue from pressure and/or shear. purplr color with red circle around it (Active)  01/09/20 1000  Location: Heel  Location Orientation: Left  Staging: Deep Tissue Pressure Injury - Purple or maroon localized area of discolored intact skin or blood-filled blister due to damage of underlying soft tissue from pressure and/or shear.  Wound Description (Comments): purplr color with red circle around it  Present on Admission: Yes      Family Communication/Anticipated D/C date and plan/Code Status   DVT prophylaxis: heparin ordered. Code Status: Full Code.  Family  Communication: patient Disposition Plan: Status is: Inpatient  Remains inpatient appropriate because:Inpatient level of care  appropriate due to severity of illness   Dispo: The patient is from: Home              Anticipated d/c is to: Home              Anticipated d/c date is: 2 days              Patient currently is not medically stable to d/c.          Medical Consultants:    nephrology   Anti-Infectives:    None  Subjective:   Sitting up in bed. Denies pain discomfort  Objective:    Vitals:   01/27/20 2215 01/28/20 0422 01/28/20 0500 01/28/20 0700  BP: (!) 143/60 (!) 122/42 (!) 122/42 137/73  Pulse: 81 75 75 78  Resp: 18 20 20 18   Temp: 98.8 F (37.1 C) 98.7 F (37.1 C) 98.7 F (37.1 C) 98 F (36.7 C)  TempSrc: Oral Oral Oral Oral  SpO2: 96% 96% 96% 100%  Weight:      Height:        Intake/Output Summary (Last 24 hours) at 01/28/2020 1522 Last data filed at 01/28/2020 1238 Gross per 24 hour  Intake 840 ml  Output 551 ml  Net 289 ml   Filed Weights   01/25/20 0341 01/26/20 2115 01/27/20 2200  Weight: 53.6 kg 53.6 kg 57.2 kg    Exam: General: Awake alert slightly pale no acute distress CV: Regular rate and rhythm no murmur gallop or rub no lower extremity edema Respiratory: No increased work of breathing breath sounds are somewhat coarse but clear no crackles no wheeze Abdomen: Nondistended soft positive bowel sounds throughout nontender to palpation no guarding or rebounding Musculoskeletal: Joints without swelling/erythema full range of motion Neuro: Alert and oriented x3 speech clear facial symmetry  Data Reviewed:   I have personally reviewed following labs and imaging studies:  Labs: Labs show the following:   Basic Metabolic Panel: Recent Labs  Lab 01/22/20 0806 01/22/20 0806 01/23/20 0534 01/23/20 0534 01/24/20 0501 01/24/20 0501 01/25/20 0416 01/25/20 0416 01/26/20 0401 01/26/20 0401 01/27/20 0334 01/28/20 0429  NA 135   < > 137   < > 138  --  137  --  134*  --  135 136  K 3.2*   < > 3.5   < > 4.3   < > 4.5   < > 5.0   < > 5.2* 5.3*  CL 99    < > 101   < > 101  --  101  --  99  --  98 100  CO2 22   < > 24   < > 18*  --  24  --  21*  --  24 24  GLUCOSE 111*   < > 77   < > 72  --  103*  --  93  --  118* 105*  BUN 71*   < > 72*   < > 73*  --  78*  --  83*  --  87* 90*  CREATININE 6.30*   < > 6.53*   < > 6.55*  --  6.45*  --  6.28*  --  6.69* 6.69*  CALCIUM 6.7*   < > 7.0*   < > 7.4*  --  7.4*  --  7.5*  --  7.9* 8.2*  MG 1.5*  --  2.1  --   --   --  1.6*  --  2.1  --   --   --   PHOS 3.3   < > 3.9   < > 4.2  --  3.8  --  3.7  --  3.3 3.4   < > = values in this interval not displayed.   GFR Estimated Creatinine Clearance: 6.1 mL/min (A) (by C-G formula based on SCr of 6.69 mg/dL (H)). Liver Function Tests: Recent Labs  Lab 01/24/20 0501 01/25/20 0416 01/26/20 0401 01/27/20 0334 01/28/20 0429  ALBUMIN 1.8* 1.9* 1.9* 2.0* 2.0*   No results for input(s): LIPASE, AMYLASE in the last 168 hours. No results for input(s): AMMONIA in the last 168 hours. Coagulation profile No results for input(s): INR, PROTIME in the last 168 hours.  CBC: Recent Labs  Lab 01/22/20 0806 01/23/20 0534 01/25/20 0416 01/28/20 0429  WBC 5.4 3.5* 4.5 4.6  NEUTROABS 3.9 2.1 2.7  --   HGB 7.4* 7.5* 7.4* 7.2*  HCT 23.0* 23.5* 23.5* 23.4*  MCV 91.3 91.4 92.2 94.7  PLT 223 220 256 256   Cardiac Enzymes: No results for input(s): CKTOTAL, CKMB, CKMBINDEX, TROPONINI in the last 168 hours. BNP (last 3 results) No results for input(s): PROBNP in the last 8760 hours. CBG: Recent Labs  Lab 01/27/20 0655 01/27/20 1114 01/27/20 1639 01/27/20 2216 01/28/20 1128  GLUCAP 100* 233* 153* 165* 166*   D-Dimer: No results for input(s): DDIMER in the last 72 hours. Hgb A1c: No results for input(s): HGBA1C in the last 72 hours. Lipid Profile: No results for input(s): CHOL, HDL, LDLCALC, TRIG, CHOLHDL, LDLDIRECT in the last 72 hours. Thyroid function studies: No results for input(s): TSH, T4TOTAL, T3FREE, THYROIDAB in the last 72 hours.  Invalid  input(s): FREET3 Anemia work up: Recent Labs    01/28/20 0429  FERRITIN 418*  TIBC 204*  IRON 84   Sepsis Labs: Recent Labs  Lab 01/22/20 0806 01/23/20 0534 01/25/20 0416 01/28/20 0429  WBC 5.4 3.5* 4.5 4.6    Microbiology No results found for this or any previous visit (from the past 240 hour(s)).  Procedures and diagnostic studies:  No results found.  Medications:   . amLODipine  5 mg Oral Daily  . darbepoetin (ARANESP) injection - NON-DIALYSIS  60 mcg Subcutaneous Q Thu-1800  . feeding supplement (ENSURE ENLIVE)  237 mL Oral BID BM  . heparin  5,000 Units Subcutaneous Q8H  . insulin aspart  0-5 Units Subcutaneous QHS  . insulin aspart  0-6 Units Subcutaneous TID WC  . mouth rinse  15 mL Mouth Rinse BID  . multivitamin with minerals  1 tablet Oral Daily  . polyethylene glycol  17 g Oral BID  . senna  2 tablet Oral BID  . sodium bicarbonate  1,300 mg Oral BID   Continuous Infusions: . sodium chloride Stopped (01/13/20 0735)     LOS: 20 days   Radene Gunning NP [facility]   How to contact the [facility] Attending or Consulting provider [place] or covering provider during after hours [name], for this patient?  1. Check the care team in [facility] and look for a) attending/consulting TRH provider listed and b) the [facility] team listed 2. Log into [email or web address] and use [place]'s universal password to access. If you do not have the password, please contact the hospital operator. 3. Locate the [facility] provider you are looking for under [facility] and page to a number that you can be directly reached. 4. If you still have difficulty reaching  the provider, please page the [facility] (Director on Call) for the Hospitalists listed on amion for assistance.  01/28/2020, 3:22 PM    Patient was seen, examined,treatment plan was discussed with the Advance Practice Provider.  I have personally reviewed the clinical findings, labs, EKG, imaging studies and management of this patient in  detail. I have also reviewed the orders written for this patient which were under my direction. I agree with the documentation, as recorded by the Advance Practice Provider.   Cindy Burns is a 74 y.o. female with poorly controlled diabetes.  Plan is to continue to monitor and await renal function recovery.  Nephrology consultation appreciated. Patient's only complaint is being woken up all night so we will ask nursing staff to allow patient a good nights rest without interruption.   Geradine Girt, DO  How to contact the [facility] Attending or Consulting provider [place] or covering provider during after hours [place], for this patient?  5. Check the care team in [facility] and look for a) attending/consulting TRH provider listed and b) the [facility] team listed 6. Log into [email or web address] and use [place]'s universal password to access. If you do not have the password, please contact the hospital operator. 7. Locate the [facility] provider you are looking for under [facility] and page to a number that you can be directly reached. 8. If you still have difficulty reaching the provider, please page the [facility] (Director on Call) for the Hospitalists listed on amion for assistance.

## 2020-01-28 NOTE — Plan of Care (Signed)
  Problem: Activity: Goal: Risk for activity intolerance will decrease Outcome: Progressing   

## 2020-01-29 DIAGNOSIS — N179 Acute kidney failure, unspecified: Secondary | ICD-10-CM | POA: Diagnosis not present

## 2020-01-29 DIAGNOSIS — F17200 Nicotine dependence, unspecified, uncomplicated: Secondary | ICD-10-CM | POA: Diagnosis not present

## 2020-01-29 DIAGNOSIS — J9601 Acute respiratory failure with hypoxia: Secondary | ICD-10-CM | POA: Diagnosis not present

## 2020-01-29 DIAGNOSIS — I10 Essential (primary) hypertension: Secondary | ICD-10-CM | POA: Diagnosis not present

## 2020-01-29 LAB — RENAL FUNCTION PANEL
Albumin: 2.2 g/dL — ABNORMAL LOW (ref 3.5–5.0)
Anion gap: 11 (ref 5–15)
BUN: 92 mg/dL — ABNORMAL HIGH (ref 8–23)
CO2: 24 mmol/L (ref 22–32)
Calcium: 8.3 mg/dL — ABNORMAL LOW (ref 8.9–10.3)
Chloride: 100 mmol/L (ref 98–111)
Creatinine, Ser: 6.71 mg/dL — ABNORMAL HIGH (ref 0.44–1.00)
GFR calc Af Amer: 7 mL/min — ABNORMAL LOW (ref >60)
GFR calc non Af Amer: 6 mL/min — ABNORMAL LOW (ref >60)
Glucose, Bld: 110 mg/dL — ABNORMAL HIGH (ref 70–99)
Phosphorus: 3.6 mg/dL (ref 2.5–4.6)
Potassium: 6 mmol/L — ABNORMAL HIGH (ref 3.5–5.1)
Sodium: 135 mmol/L (ref 135–145)

## 2020-01-29 LAB — BASIC METABOLIC PANEL
Anion gap: 13 (ref 5–15)
Anion gap: 11 (ref 5–15)
BUN: 93 mg/dL — ABNORMAL HIGH (ref 8–23)
BUN: 96 mg/dL — ABNORMAL HIGH (ref 8–23)
CO2: 23 mmol/L (ref 22–32)
CO2: 24 mmol/L (ref 22–32)
Calcium: 8.6 mg/dL — ABNORMAL LOW (ref 8.9–10.3)
Calcium: 8.6 mg/dL — ABNORMAL LOW (ref 8.9–10.3)
Chloride: 97 mmol/L — ABNORMAL LOW (ref 98–111)
Chloride: 98 mmol/L (ref 98–111)
Creatinine, Ser: 7.01 mg/dL — ABNORMAL HIGH (ref 0.44–1.00)
Creatinine, Ser: 6.62 mg/dL — ABNORMAL HIGH (ref 0.44–1.00)
GFR calc Af Amer: 6 mL/min — ABNORMAL LOW (ref >60)
GFR calc Af Amer: 7 mL/min — ABNORMAL LOW (ref >60)
GFR calc non Af Amer: 5 mL/min — ABNORMAL LOW (ref >60)
GFR calc non Af Amer: 6 mL/min — ABNORMAL LOW (ref >60)
Glucose, Bld: 155 mg/dL — ABNORMAL HIGH (ref 70–99)
Glucose, Bld: 166 mg/dL — ABNORMAL HIGH (ref 70–99)
Potassium: 6.4 mmol/L (ref 3.5–5.1)
Potassium: 6.6 mmol/L (ref 3.5–5.1)
Sodium: 133 mmol/L — ABNORMAL LOW (ref 135–145)
Sodium: 133 mmol/L — ABNORMAL LOW (ref 135–145)

## 2020-01-29 LAB — GLUCOSE, CAPILLARY
Glucose-Capillary: 100 mg/dL — ABNORMAL HIGH (ref 70–99)
Glucose-Capillary: 129 mg/dL — ABNORMAL HIGH (ref 70–99)
Glucose-Capillary: 144 mg/dL — ABNORMAL HIGH (ref 70–99)
Glucose-Capillary: 121 mg/dL — ABNORMAL HIGH (ref 70–99)

## 2020-01-29 MED ORDER — SODIUM ZIRCONIUM CYCLOSILICATE 5 G PO PACK
5.0000 g | PACK | Freq: Every day | ORAL | Status: DC
  Filled 2020-01-29: qty 1

## 2020-01-29 MED ORDER — SODIUM POLYSTYRENE SULFONATE 15 GM/60ML PO SUSP
30.0000 g | Freq: Once | ORAL | Status: AC
  Administered 2020-01-29: 30 g via ORAL
  Filled 2020-01-29: qty 120

## 2020-01-29 MED ORDER — ENOXAPARIN SODIUM 30 MG/0.3ML ~~LOC~~ SOLN
30.0000 mg | SUBCUTANEOUS | Status: DC
  Administered 2020-01-29 – 2020-02-01 (×4): 30 mg via SUBCUTANEOUS
  Filled 2020-01-29 (×4): qty 0.3

## 2020-01-29 MED ORDER — SODIUM ZIRCONIUM CYCLOSILICATE 10 G PO PACK
10.0000 g | PACK | Freq: Two times a day (BID) | ORAL | Status: DC
  Administered 2020-01-29 – 2020-01-30 (×3): 10 g via ORAL
  Filled 2020-01-29 (×2): qty 1

## 2020-01-29 NOTE — Progress Notes (Signed)
Bodenheimer,NP texted with potassium level of 6.6. Awaiting response.

## 2020-01-29 NOTE — Progress Notes (Signed)
Progress Note    Cindy Burns  SWH:[id number] DOB: 1946/01/13  DOA: 01/08/2020 PCP: [name], MD    Brief Narrative:    Medical records reviewed and are as summarized below:  Cindy Burns is an 74 y.o. female past medical history significant for breast cancer, COPD, tobacco use, diabetes, MDD admitted April 3 with acute renal failure requiring CRRT, hyperkalemia, anion gap metabolic acidosis secondary to DKA, acute respiratory failure requiring intubation extubated on April 6.  Await renal function recovery  Assessment/Plan:   Principal Problem:   Renal failure Active Problems:   CIGARETTE SMOKER   Hypertension   Pressure injury of skin   Encounter for central line placement   Pain and swelling of ankle, left   Hypocalcemia   Metabolic acidosis   Acute respiratory failure with hypoxia (HCC)   UTI (urinary tract infection)  Acute renal failure secondary to severe volume depletion in the setting of DKA/UTI/metabolic acidosis.  -Evaluated by nephrology who opine stable low GFR with good urine suggestive of GFR recovery are hopeful, no uremia and no strong reason for hemodialysis. -Defer management to nephrology -Temp HD catheter removed on 4/20 -continue to monitor   Acute respiratory failure with hypoxia in the setting of COPD and former tobacco use. Patient intubated on admission. Extubated 2 days later. Oxygen saturation levels greater than 90% on room air. Her last cigarette was 2 days before she was admitted. -Nebulizers -Incentive spirometry -Mobilize -she anticipates not resuming smoking at discharge  Hyperkalemia -Started Lokelma -On renal diet  Urinary tract infection. Pansensitive E. coli noted on culture.  -Blood cultures with no growth to date.  -completed 7-day course of Rocephin  Normocytic anemia. Hemoglobin remaining stable. No sign symptoms of bleeding. -Monitor  Diabetes type 2. Uncontrolled. Hemoglobin A1c 8.5.   -Resume home medicines upon discharge   left foot drop/pain and swelling of the left ankle.  -X-rays left ankle negative. MRI lumbar spine reveals mild disc bulging at L3 and 4, leftward disc protrusion with annular tear, rightward disc protrusion and annular tear at L5-S1, moderate facet hypertrophy at L5-S1. Evaluated by neurosurgery who opined no immediate intervention recommended outpatient follow-up with possible EMG -AFO/dorsiflexion assist per PT  Pressure Injury 01/08/20 Coccyx Medial Stage 2 -  Partial thickness loss of dermis presenting as a shallow open injury with a red, pink wound bed without slough. small area of broken skin on coccyx (Active)  01/08/20 2200  Location: Coccyx  Location Orientation: Medial  Staging: Stage 2 -  Partial thickness loss of dermis presenting as a shallow open injury with a red, pink wound bed without slough.  Wound Description (Comments): small area of broken skin on coccyx  Present on Admission: Yes     Pressure Injury 01/09/20 Heel Left Deep Tissue Pressure Injury - Purple or maroon localized area of discolored intact skin or blood-filled blister due to damage of underlying soft tissue from pressure and/or shear. purplr color with red circle around it (Active)  01/09/20 1000  Location: Heel  Location Orientation: Left  Staging: Deep Tissue Pressure Injury - Purple or maroon localized area of discolored intact skin or blood-filled blister due to damage of underlying soft tissue from pressure and/or shear.  Wound Description (Comments): purplr color with red circle around it  Present on Admission: Yes      Family Communication/Anticipated D/C date and plan/Code Status   DVT prophylaxis: heparin ordered. Code Status: Full Code.  Family Communication: patient Disposition Plan: Status is:  Inpatient  Remains inpatient appropriate because:Inpatient level of care appropriate due to severity of illness   Dispo: The patient is from: Home               Anticipated d/c is to: Home              Anticipated d/c date is: 2 days              Patient currently is not medically stable to d/c.  Awaiting kidney function stability          Medical Consultants:    nephrology     Subjective:   No overnight events  Objective:    Vitals:   01/28/20 1649 01/28/20 1954 01/29/20 0509 01/29/20 0834  BP: 138/60 (!) 142/53 (!) 129/51 (!) 148/42  Pulse: 75 76 73 79  Resp: 18 20 20    Temp: 98.7 F (37.1 C) 98.3 F (36.8 C) 98.2 F (36.8 C) 98.1 F (36.7 C)  TempSrc: Oral Oral Oral Oral  SpO2: 100% 97% 95% 99%  Weight:      Height:        Intake/Output Summary (Last 24 hours) at 01/29/2020 1027 Last data filed at 01/29/2020 0900 Gross per 24 hour  Intake 300 ml  Output 2050 ml  Net -1750 ml   Filed Weights   01/25/20 0341 01/26/20 2115 01/27/20 2200  Weight: 53.6 kg 53.6 kg 57.2 kg    Exam:  General: Appearance:    Well developed, well nourished female in no acute distress  Eyes:    PERRL, conjunctiva/corneas clear, EOM's intact       Lungs:     Clear to auscultation bilaterally, respirations unlabored  Heart:    Normal heart rate. Normal rhythm. No murmurs, rubs, or gallops.   MS:   All extremities are intact.   Neurologic:   Awake, alert, oriented x 3. No apparent focal neurological           defect.     Data Reviewed:   I have personally reviewed following labs and imaging studies:  Labs: Labs show the following:   Basic Metabolic Panel: Recent Labs  Lab 01/23/20 0534 01/24/20 0501 01/25/20 0416 01/25/20 0416 01/26/20 0401 01/26/20 0401 01/27/20 0334 01/27/20 0334 01/28/20 0429 01/29/20 0341  NA 137   < > 137  --  134*  --  135  --  136 135  K 3.5   < > 4.5   < > 5.0   < > 5.2*   < > 5.3* 6.0*  CL 101   < > 101  --  99  --  98  --  100 100  CO2 24   < > 24  --  21*  --  24  --  24 24  GLUCOSE 77   < > 103*  --  93  --  118*  --  105* 110*  BUN 72*   < > 78*  --  83*  --  87*  --  90* 92*   CREATININE 6.53*   < > 6.45*  --  6.28*  --  6.69*  --  6.69* 6.71*  CALCIUM 7.0*   < > 7.4*  --  7.5*  --  7.9*  --  8.2* 8.3*  MG 2.1  --  1.6*  --  2.1  --   --   --   --   --   PHOS 3.9   < > 3.8  --  3.7  --  3.3  --  3.4 3.6   < > = values in this interval not displayed.   GFR Estimated Creatinine Clearance: 6.1 mL/min (A) (by C-G formula based on SCr of 6.71 mg/dL (H)). Liver Function Tests: Recent Labs  Lab 01/25/20 0416 01/26/20 0401 01/27/20 0334 01/28/20 0429 01/29/20 0341  ALBUMIN 1.9* 1.9* 2.0* 2.0* 2.2*   No results for input(s): LIPASE, AMYLASE in the last 168 hours. No results for input(s): AMMONIA in the last 168 hours. Coagulation profile No results for input(s): INR, PROTIME in the last 168 hours.  CBC: Recent Labs  Lab 01/23/20 0534 01/25/20 0416 01/28/20 0429  WBC 3.5* 4.5 4.6  NEUTROABS 2.1 2.7  --   HGB 7.5* 7.4* 7.2*  HCT 23.5* 23.5* 23.4*  MCV 91.4 92.2 94.7  PLT 220 256 256   Cardiac Enzymes: No results for input(s): CKTOTAL, CKMB, CKMBINDEX, TROPONINI in the last 168 hours. BNP (last 3 results) No results for input(s): PROBNP in the last 8760 hours. CBG: Recent Labs  Lab 01/28/20 0638 01/28/20 1128 01/28/20 1648 01/28/20 2130 01/29/20 0647  GLUCAP 107* 166* 101* 162* 100*   D-Dimer: No results for input(s): DDIMER in the last 72 hours. Hgb A1c: No results for input(s): HGBA1C in the last 72 hours. Lipid Profile: No results for input(s): CHOL, HDL, LDLCALC, TRIG, CHOLHDL, LDLDIRECT in the last 72 hours. Thyroid function studies: No results for input(s): TSH, T4TOTAL, T3FREE, THYROIDAB in the last 72 hours.  Invalid input(s): FREET3 Anemia work up: Recent Labs    01/28/20 0429  FERRITIN 418*  TIBC 204*  IRON 84   Sepsis Labs: Recent Labs  Lab 01/23/20 0534 01/25/20 0416 01/28/20 0429  WBC 3.5* 4.5 4.6    Microbiology No results found for this or any previous visit (from the past 240 hour(s)).  Procedures and  diagnostic studies:  No results found.  Medications:   . amLODipine  5 mg Oral Daily  . darbepoetin (ARANESP) injection - NON-DIALYSIS  60 mcg Subcutaneous Q Thu-1800  . feeding supplement (ENSURE ENLIVE)  237 mL Oral BID BM  . heparin  5,000 Units Subcutaneous Q8H  . insulin aspart  0-5 Units Subcutaneous QHS  . insulin aspart  0-6 Units Subcutaneous TID WC  . mouth rinse  15 mL Mouth Rinse BID  . multivitamin with minerals  1 tablet Oral Daily  . polyethylene glycol  17 g Oral BID  . senna  2 tablet Oral BID  . sodium bicarbonate  1,300 mg Oral BID  . sodium zirconium cyclosilicate  10 g Oral BID   Continuous Infusions: . sodium chloride Stopped (01/13/20 0735)     LOS: 21 days   Geradine Girt DO [facility]   How to contact the [facility] Attending or Consulting provider [place] or covering provider during after hours [place], for this patient?  1. Check the care team in [facility] and look for a) attending/consulting TRH provider listed and b) the [facility] team listed 2. Log into [email or web address] and use [place]'s universal password to access. If you do not have the password, please contact the hospital operator. 3. Locate the [facility] provider you are looking for under [facility] and page to a number that you can be directly reached. 4. If you still have difficulty reaching the provider, please page the [facility] (Director on Call) for the Hospitalists listed on amion for assistance.  01/29/2020, 10:27 AM

## 2020-01-29 NOTE — Progress Notes (Signed)
Admit: 01/08/2020 LOS: 21  55F AKI from ATN req CRRT through 4/6 now following for recovery of GFR / HD  Subjective:  . Unchaged SCr . K 6.0 . Good UOP . Pt w/o N/V/anorexia, feels good  04/23 0701 - 04/24 0700 In: 500 [P.O.:500] Out: 1850 [Urine:1850]  Filed Weights   01/25/20 0341 01/26/20 2115 01/27/20 2200  Weight: 53.6 kg 53.6 kg 57.2 kg    Scheduled Meds: . amLODipine  5 mg Oral Daily  . darbepoetin (ARANESP) injection - NON-DIALYSIS  60 mcg Subcutaneous Q Thu-1800  . feeding supplement (ENSURE ENLIVE)  237 mL Oral BID BM  . heparin  5,000 Units Subcutaneous Q8H  . insulin aspart  0-5 Units Subcutaneous QHS  . insulin aspart  0-6 Units Subcutaneous TID WC  . mouth rinse  15 mL Mouth Rinse BID  . multivitamin with minerals  1 tablet Oral Daily  . polyethylene glycol  17 g Oral BID  . senna  2 tablet Oral BID  . sodium bicarbonate  1,300 mg Oral BID  . sodium zirconium cyclosilicate  10 g Oral BID   Continuous Infusions: . sodium chloride Stopped (01/13/20 0735)   PRN Meds:.sodium chloride, dextrose, ipratropium-albuterol, labetalol  Current Labs: reviewed  Results for ANIRA, SENEGAL (MRN [id number]) as of 01/28/2020 12:13  Ref. Range 01/28/2020 04:29  Saturation Ratios Latest Ref Range: 10.4 - 31.8 % 41 (H)    Physical Exam:  Blood pressure (!) 148/42, pulse 79, temperature 98.1 F (36.7 C), temperature source Oral, resp. rate 20, height 5\' 2"  (1.575 m), weight 57.2 kg, SpO2 99 %. NAD, I nchair Regular, no rub CTAB No sig LEE L IJ Temp HD cath bandaged S/nt/nd  A 1. AKI, nl baseline; likely ATN from hypovolemia/hemodynamic; stable low GFR, UOP improving and suggestive of GFR recovery; No uremia, Cont expectant management. No HD needed 2. Anemia; Hb stable 7s;  1. ASx;  2. Fe replete,  3. ESA Aranesp 60 qThurs 3. HTN BP stable; amlodipine 4. Acidosis on NaHCO3, stable HCO3 5. DKA resolved 6. LEE,  resolved 7. DM2 8. COPD 9. Hypomag 10. Hyperkalemia  P . Stable, cont supportive care, unchanged SCr is reason for hope . Lokelma 10 BID, rpt labs this PM . Cont to trend labs / UOP . OOB to chair, PT/OT . Daily weights, Daily Renal Panel, Strict I/Os, Avoid nephrotoxins (NSAIDs, judicious IV Contrast)    Pearson Grippe MD 01/29/2020, 11:05 AM  Recent Labs  Lab 01/27/20 0334 01/28/20 0429 01/29/20 0341  NA 135 136 135  K 5.2* 5.3* 6.0*  CL 98 100 100  CO2 24 24 24   GLUCOSE 118* 105* 110*  BUN 87* 90* 92*  CREATININE 6.69* 6.69* 6.71*  CALCIUM 7.9* 8.2* 8.3*  PHOS 3.3 3.4 3.6   Recent Labs  Lab 01/23/20 0534 01/25/20 0416 01/28/20 0429  WBC 3.5* 4.5 4.6  NEUTROABS 2.1 2.7  --   HGB 7.5* 7.4* 7.2*  HCT 23.5* 23.5* 23.4*  MCV 91.4 92.2 94.7  PLT 220 256 256

## 2020-01-30 DIAGNOSIS — J9601 Acute respiratory failure with hypoxia: Secondary | ICD-10-CM | POA: Diagnosis not present

## 2020-01-30 DIAGNOSIS — N179 Acute kidney failure, unspecified: Secondary | ICD-10-CM | POA: Diagnosis not present

## 2020-01-30 DIAGNOSIS — F17200 Nicotine dependence, unspecified, uncomplicated: Secondary | ICD-10-CM | POA: Diagnosis not present

## 2020-01-30 LAB — GLUCOSE, CAPILLARY
Glucose-Capillary: 99 mg/dL (ref 70–99)
Glucose-Capillary: 113 mg/dL — ABNORMAL HIGH (ref 70–99)
Glucose-Capillary: 164 mg/dL — ABNORMAL HIGH (ref 70–99)
Glucose-Capillary: 91 mg/dL (ref 70–99)

## 2020-01-30 LAB — RENAL FUNCTION PANEL
Albumin: 2.2 g/dL — ABNORMAL LOW (ref 3.5–5.0)
Anion gap: 12 (ref 5–15)
BUN: 92 mg/dL — ABNORMAL HIGH (ref 8–23)
CO2: 23 mmol/L (ref 22–32)
Calcium: 8.4 mg/dL — ABNORMAL LOW (ref 8.9–10.3)
Chloride: 101 mmol/L (ref 98–111)
Creatinine, Ser: 7.07 mg/dL — ABNORMAL HIGH (ref 0.44–1.00)
GFR calc Af Amer: 6 mL/min — ABNORMAL LOW (ref >60)
GFR calc non Af Amer: 5 mL/min — ABNORMAL LOW (ref >60)
Glucose, Bld: 106 mg/dL — ABNORMAL HIGH (ref 70–99)
Phosphorus: 4.3 mg/dL (ref 2.5–4.6)
Potassium: 5.9 mmol/L — ABNORMAL HIGH (ref 3.5–5.1)
Sodium: 136 mmol/L (ref 135–145)

## 2020-01-30 LAB — BASIC METABOLIC PANEL
Anion gap: 10 (ref 5–15)
BUN: 92 mg/dL — ABNORMAL HIGH (ref 8–23)
CO2: 29 mmol/L (ref 22–32)
Calcium: 8.5 mg/dL — ABNORMAL LOW (ref 8.9–10.3)
Chloride: 99 mmol/L (ref 98–111)
Creatinine, Ser: 6.9 mg/dL — ABNORMAL HIGH (ref 0.44–1.00)
GFR calc Af Amer: 6 mL/min — ABNORMAL LOW (ref >60)
GFR calc non Af Amer: 5 mL/min — ABNORMAL LOW (ref >60)
Glucose, Bld: 191 mg/dL — ABNORMAL HIGH (ref 70–99)
Potassium: 6.1 mmol/L — ABNORMAL HIGH (ref 3.5–5.1)
Sodium: 138 mmol/L (ref 135–145)

## 2020-01-30 LAB — CBC
HCT: 23.5 % — ABNORMAL LOW (ref 36.0–46.0)
Hemoglobin: 7.5 g/dL — ABNORMAL LOW (ref 12.0–15.0)
MCH: 30.5 pg (ref 26.0–34.0)
MCHC: 31.9 g/dL (ref 30.0–36.0)
MCV: 95.5 fL (ref 80.0–100.0)
Platelets: 272 10*3/uL (ref 150–400)
RBC: 2.46 MIL/uL — ABNORMAL LOW (ref 3.87–5.11)
RDW: 17.2 % — ABNORMAL HIGH (ref 11.5–15.5)
WBC: 5 10*3/uL (ref 4.0–10.5)
nRBC: 0.4 % — ABNORMAL HIGH (ref 0.0–0.2)

## 2020-01-30 MED ORDER — SODIUM POLYSTYRENE SULFONATE 15 GM/60ML PO SUSP
45.0000 g | Freq: Once | ORAL | Status: AC
  Administered 2020-01-30: 45 g via ORAL

## 2020-01-30 MED ORDER — SODIUM POLYSTYRENE SULFONATE 15 GM/60ML PO SUSP
30.0000 g | Freq: Once | ORAL | Status: AC
  Administered 2020-01-30: 30 g via ORAL
  Filled 2020-01-30: qty 120

## 2020-01-30 NOTE — Progress Notes (Signed)
Admit: 01/08/2020 LOS: 26  61F AKI from ATN req CRRT through 4/6 now following for recovery of GFR / HD  Subjective:  . Unchaged SCr . Req Kayexalate for K up to 6.6 . Good UOP . Pt w/o N/V/anorexia, feels good  04/24 0701 - 04/25 0700 In: 240 [P.O.:240] Out: 2702 [Urine:2700; Stool:2]  Filed Weights   01/27/20 2200 01/29/20 2047 01/30/20 0500  Weight: 57.2 kg 57.2 kg 57.2 kg    Scheduled Meds: . amLODipine  5 mg Oral Daily  . darbepoetin (ARANESP) injection - NON-DIALYSIS  60 mcg Subcutaneous Q Thu-1800  . enoxaparin (LOVENOX) injection  30 mg Subcutaneous Q24H  . feeding supplement (ENSURE ENLIVE)  237 mL Oral BID BM  . insulin aspart  0-5 Units Subcutaneous QHS  . insulin aspart  0-6 Units Subcutaneous TID WC  . mouth rinse  15 mL Mouth Rinse BID  . multivitamin with minerals  1 tablet Oral Daily  . polyethylene glycol  17 g Oral BID  . senna  2 tablet Oral BID  . sodium bicarbonate  1,300 mg Oral BID  . sodium polystyrene  30 g Oral Once   Continuous Infusions: . sodium chloride Stopped (01/13/20 0735)   PRN Meds:.sodium chloride, dextrose, ipratropium-albuterol, labetalol  Current Labs: reviewed  Results for Cindy, Burns (MRN [id number]) as of 01/28/2020 12:13  Ref. Range 01/28/2020 04:29  Saturation Ratios Latest Ref Range: 10.4 - 31.8 % 41 (H)    Physical Exam:  Blood pressure (!) 132/56, pulse 76, temperature 98.2 F (36.8 C), temperature source Oral, resp. rate 18, height 5\' 2"  (1.575 m), weight 57.2 kg, SpO2 98 %. NAD, I nchair Regular, no rub CTAB No sig LEE L IJ Temp HD cath bandaged S/nt/nd  A 1. AKI, nl baseline; likely ATN from hypovolemia/hemodynamic; stable low GFR, UOP has been off HD for > 7d but problems with #2, see below; No uremia,  2. Hyperkalemia: K u pto 6.6 on 4/24 improved with kayexalate. Worsened despite lokelma.  K 5.9 thisAM, redose 30gm kayexalate, rpt BMP this PM.  If K remains elevated tomrorow might need to resume HD, place  TDC, and CLIP as AKI to outpt HD 3. Anemia; Hb stable 7s;  1. ASx;  2. Fe replete,  3. ESA Aranesp 60 qThurs 4. HTN BP stable; amlodipine 5. Acidosis on NaHCO3, stable HCO3 6. DKA resolved 7. LEE, resolved 8. DM2 9. COPD 10. Hypomag   P . As above, rpt kayexalate today and labs . Might need to restart HD for K control, re-eval in AM for [facility] . NPO p MN in case TDC needed . OOB to chair, PT/OT . Daily weights, Daily Renal Panel, Strict I/Os, Avoid nephrotoxins (NSAIDs, judicious IV Contrast)    Pearson Grippe MD 01/30/2020, 10:15 AM  Recent Labs  Lab 01/28/20 0429 01/28/20 0429 01/29/20 0341 01/29/20 0341 01/29/20 1518 01/29/20 1837 01/30/20 0458  NA 136   < > 135   < > 133* 133* 136  K 5.3*   < > 6.0*   < > 6.4* 6.6* 5.9*  CL 100   < > 100   < > 97* 98 101  CO2 24   < > 24   < > 23 24 23   GLUCOSE 105*   < > 110*   < > 155* 166* 106*  BUN 90*   < > 92*   < > 93* 96* 92*  CREATININE 6.69*   < > 6.71*   < > 7.01* 6.62*  7.07*  CALCIUM 8.2*   < > 8.3*   < > 8.6* 8.6* 8.4*  PHOS 3.4  --  3.6  --   --   --  4.3   < > = values in this interval not displayed.   Recent Labs  Lab 01/25/20 0416 01/28/20 0429 01/30/20 0458  WBC 4.5 4.6 5.0  NEUTROABS 2.7  --   --   HGB 7.4* 7.2* 7.5*  HCT 23.5* 23.4* 23.5*  MCV 92.2 94.7 95.5  PLT 256 256 272

## 2020-01-30 NOTE — Progress Notes (Signed)
Progress Note    Cindy Burns  KKX:[id number] DOB: [birth year]  DOA: 01/08/2020 PCP: [name], MD    Brief Narrative:    Medical records reviewed and are as summarized below:  Cindy Burns is an 74 y.o. female past medical history significant for breast cancer, COPD, tobacco use, diabetes, MDD admitted April 3 with acute renal failure requiring CRRT, hyperkalemia, anion gap metabolic acidosis secondary to DKA, acute respiratory failure requiring intubation extubated on April 6.  Await renal function recovery.    Assessment/Plan:   Principal Problem:   Renal failure Active Problems:   CIGARETTE SMOKER   Hypertension   Pressure injury of skin   Encounter for central line placement   Pain and swelling of ankle, left   Hypocalcemia   Metabolic acidosis   Acute respiratory failure with hypoxia (HCC)   UTI (urinary tract infection)  Acute renal failure secondary to severe volume depletion in the setting of DKA/UTI/metabolic acidosis.  -Evaluated by nephrology who opine stable low GFR with good urine suggestive of GFR recovery are hopeful, no uremia and no strong reason for hemodialysis. -Defer management to nephrology: Plan to restart dialysis if potassium continues to be an issue -Temp HD catheter removed on 4/20 -continue to monitor   Acute respiratory failure with hypoxia in the setting of COPD and former tobacco use. Patient intubated on admission. Extubated 2 days later. Oxygen saturation levels greater than 90% on room air. Her last cigarette was 2 days before she was admitted. -Nebulizers -Incentive spirometry -Mobilize -she anticipates not resuming smoking at discharge  Hyperkalemia -Started Lokelma -On renal diet  Urinary tract infection. Pansensitive E. coli noted on culture.  -Blood cultures with no growth to date.  -completed 7-day course of Rocephin  Normocytic anemia. Hemoglobin remaining stable. No sign symptoms of  bleeding. -Monitor  Diabetes type 2. Uncontrolled. Hemoglobin A1c 8.5.  -Resume home medicines upon discharge  if creatinine allows  left foot drop/pain and swelling of the left ankle.  -X-rays left ankle negative. MRI lumbar spine reveals mild disc bulging at L3 and 4, leftward disc protrusion with annular tear, rightward disc protrusion and annular tear at L5-S1, moderate facet hypertrophy at L5-S1. Evaluated by neurosurgery who opined no immediate intervention recommended outpatient follow-up with possible EMG -AFO/dorsiflexion assist per PT  Pressure Injury 01/08/20 Coccyx Medial Stage 2 -  Partial thickness loss of dermis presenting as a shallow open injury with a red, pink wound bed without slough. small area of broken skin on coccyx (Active)  01/08/20 2200  Location: Coccyx  Location Orientation: Medial  Staging: Stage 2 -  Partial thickness loss of dermis presenting as a shallow open injury with a red, pink wound bed without slough.  Wound Description (Comments): small area of broken skin on coccyx  Present on Admission: Yes     Pressure Injury 01/09/20 Heel Left Deep Tissue Pressure Injury - Purple or maroon localized area of discolored intact skin or blood-filled blister due to damage of underlying soft tissue from pressure and/or shear. purplr color with red circle around it (Active)  01/09/20 1000  Location: Heel  Location Orientation: Left  Staging: Deep Tissue Pressure Injury - Purple or maroon localized area of discolored intact skin or blood-filled blister due to damage of underlying soft tissue from pressure and/or shear.  Wound Description (Comments): purplr color with red circle around it  Present on Admission: Yes      Family Communication/Anticipated D/C date and plan/Code Status  DVT prophylaxis: heparin ordered. Code Status: Full Code.  Family Communication: patient Disposition Plan: Status is: Inpatient  Remains inpatient appropriate  because:Inpatient level of care appropriate due to severity of illness   Dispo: The patient is from: Home              Anticipated d/c is to: Home              Anticipated d/c date is: 2 days              Patient currently is not medically stable to d/c.  Awaiting kidney function stability          Medical Consultants:    nephrology     Subjective:   Patient without complaints  Objective:    Vitals:   01/29/20 2047 01/30/20 0451 01/30/20 0500 01/30/20 0900  BP: (!) 154/68 (!) 137/54  (!) 132/56  Pulse: 74 72  76  Resp: 18 18  18   Temp: 98.1 F (36.7 C) 98.1 F (36.7 C)  98.2 F (36.8 C)  TempSrc: Oral Oral  Oral  SpO2: 99% 96%  98%  Weight: 57.2 kg  57.2 kg   Height:        Intake/Output Summary (Last 24 hours) at 01/30/2020 1316 Last data filed at 01/30/2020 1200 Gross per 24 hour  Intake 360 ml  Output 3151 ml  Net -2791 ml   Filed Weights   01/27/20 2200 01/29/20 2047 01/30/20 0500  Weight: 57.2 kg 57.2 kg 57.2 kg    Exam:  General: Appearance:    Well developed, well nourished female in no acute distress     Lungs:     Clear to auscultation bilaterally, respirations unlabored  Heart:    Normal heart rate. Normal rhythm. No murmurs, rubs, or gallops.   MS:   All extremities are intact.   Neurologic:   Awake, alert, oriented x 3. No apparent focal neurological           defect.                         Data Reviewed:   I have personally reviewed following labs and imaging studies:  Labs: Labs show the following:   Basic Metabolic Panel: Recent Labs  Lab 01/25/20 0416 01/25/20 0416 01/26/20 0401 01/26/20 0401 01/27/20 0334 01/27/20 0334 01/28/20 0429 01/28/20 0429 01/29/20 0341 01/29/20 0341 01/29/20 1518 01/29/20 1518 01/29/20 1837 01/30/20 0458  NA 137   < > 134*   < > 135   < > 136  --  135  --  133*  --  133* 136  K 4.5   < > 5.0   < > 5.2*   < > 5.3*   < > 6.0*   < > 6.4*   < > 6.6* 5.9*  CL 101   < > 99   < > 98    < > 100  --  100  --  97*  --  98 101  CO2 24   < > 21*   < > 24   < > 24  --  24  --  23  --  24 23  GLUCOSE 103*   < > 93   < > 118*   < > 105*  --  110*  --  155*  --  166* 106*  BUN 78*   < > 83*   < > 87*   < > 90*  --  92*  --  93*  --  96* 92*  CREATININE 6.45*   < > 6.28*   < > 6.69*   < > 6.69*  --  6.71*  --  7.01*  --  6.62* 7.07*  CALCIUM 7.4*   < > 7.5*   < > 7.9*   < > 8.2*  --  8.3*  --  8.6*  --  8.6* 8.4*  MG 1.6*  --  2.1  --   --   --   --   --   --   --   --   --   --   --   PHOS 3.8   < > 3.7  --  3.3  --  3.4  --  3.6  --   --   --   --  4.3   < > = values in this interval not displayed.   GFR Estimated Creatinine Clearance: 5.8 mL/min (A) (by C-G formula based on SCr of 7.07 mg/dL (H)). Liver Function Tests: Recent Labs  Lab 01/26/20 0401 01/27/20 0334 01/28/20 0429 01/29/20 0341 01/30/20 0458  ALBUMIN 1.9* 2.0* 2.0* 2.2* 2.2*   No results for input(s): LIPASE, AMYLASE in the last 168 hours. No results for input(s): AMMONIA in the last 168 hours. Coagulation profile No results for input(s): INR, PROTIME in the last 168 hours.  CBC: Recent Labs  Lab 01/25/20 0416 01/28/20 0429 01/30/20 0458  WBC 4.5 4.6 5.0  NEUTROABS 2.7  --   --   HGB 7.4* 7.2* 7.5*  HCT 23.5* 23.4* 23.5*  MCV 92.2 94.7 95.5  PLT 256 256 272   Cardiac Enzymes: No results for input(s): CKTOTAL, CKMB, CKMBINDEX, TROPONINI in the last 168 hours. BNP (last 3 results) No results for input(s): PROBNP in the last 8760 hours. CBG: Recent Labs  Lab 01/29/20 1133 01/29/20 1553 01/29/20 2047 01/30/20 0647 01/30/20 1120  GLUCAP 129* 144* 121* 99 113*   D-Dimer: No results for input(s): DDIMER in the last 72 hours. Hgb A1c: No results for input(s): HGBA1C in the last 72 hours. Lipid Profile: No results for input(s): CHOL, HDL, LDLCALC, TRIG, CHOLHDL, LDLDIRECT in the last 72 hours. Thyroid function studies: No results for input(s): TSH, T4TOTAL, T3FREE, THYROIDAB in the last 72  hours.  Invalid input(s): FREET3 Anemia work up: Recent Labs    01/28/20 0429  FERRITIN 418*  TIBC 204*  IRON 84   Sepsis Labs: Recent Labs  Lab 01/25/20 0416 01/28/20 0429 01/30/20 0458  WBC 4.5 4.6 5.0    Microbiology No results found for this or any previous visit (from the past 240 hour(s)).  Procedures and diagnostic studies:  No results found.  Medications:   . amLODipine  5 mg Oral Daily  . darbepoetin (ARANESP) injection - NON-DIALYSIS  60 mcg Subcutaneous Q Thu-1800  . enoxaparin (LOVENOX) injection  30 mg Subcutaneous Q24H  . feeding supplement (ENSURE ENLIVE)  237 mL Oral BID BM  . insulin aspart  0-5 Units Subcutaneous QHS  . insulin aspart  0-6 Units Subcutaneous TID WC  . mouth rinse  15 mL Mouth Rinse BID  . multivitamin with minerals  1 tablet Oral Daily  . polyethylene glycol  17 g Oral BID  . senna  2 tablet Oral BID  . sodium bicarbonate  1,300 mg Oral BID   Continuous Infusions: . sodium chloride Stopped (01/13/20 0735)     LOS: 22 days   Geradine Girt DO [facility]  How to contact the [facility] Attending or Consulting provider [place] or covering provider during after hours [place], for this patient?  1. Check the care team in [facility] and look for a) attending/consulting TRH provider listed and b) the [facility] team listed 2. Log into [email or web address] and use [place]'s universal password to access. If you do not have the password, please contact the hospital operator. 3. Locate the [facility] provider you are looking for under [facility] and page to a number that you can be directly reached. 4. If you still have difficulty reaching the provider, please page the [facility] (Director on Call) for the Hospitalists listed on amion for assistance.  01/30/2020, 1:16 PM

## 2020-01-31 DIAGNOSIS — I10 Essential (primary) hypertension: Secondary | ICD-10-CM | POA: Diagnosis not present

## 2020-01-31 DIAGNOSIS — N179 Acute kidney failure, unspecified: Secondary | ICD-10-CM | POA: Diagnosis not present

## 2020-01-31 DIAGNOSIS — J9601 Acute respiratory failure with hypoxia: Secondary | ICD-10-CM | POA: Diagnosis not present

## 2020-01-31 LAB — RENAL FUNCTION PANEL
Albumin: 2.3 g/dL — ABNORMAL LOW (ref 3.5–5.0)
Anion gap: 13 (ref 5–15)
BUN: 85 mg/dL — ABNORMAL HIGH (ref 8–23)
CO2: 27 mmol/L (ref 22–32)
Calcium: 8 mg/dL — ABNORMAL LOW (ref 8.9–10.3)
Chloride: 97 mmol/L — ABNORMAL LOW (ref 98–111)
Creatinine, Ser: 6.64 mg/dL — ABNORMAL HIGH (ref 0.44–1.00)
GFR calc Af Amer: 7 mL/min — ABNORMAL LOW (ref >60)
GFR calc non Af Amer: 6 mL/min — ABNORMAL LOW (ref >60)
Glucose, Bld: 105 mg/dL — ABNORMAL HIGH (ref 70–99)
Phosphorus: 5.6 mg/dL — ABNORMAL HIGH (ref 2.5–4.6)
Potassium: 4.5 mmol/L (ref 3.5–5.1)
Sodium: 137 mmol/L (ref 135–145)

## 2020-01-31 LAB — GLUCOSE, CAPILLARY
Glucose-Capillary: 101 mg/dL — ABNORMAL HIGH (ref 70–99)
Glucose-Capillary: 248 mg/dL — ABNORMAL HIGH (ref 70–99)
Glucose-Capillary: 74 mg/dL (ref 70–99)
Glucose-Capillary: 164 mg/dL — ABNORMAL HIGH (ref 70–99)

## 2020-01-31 MED ORDER — DARBEPOETIN ALFA 100 MCG/0.5ML IJ SOSY
100.0000 ug | PREFILLED_SYRINGE | INTRAMUSCULAR | Status: DC
  Administered 2020-02-03 – 2020-02-17 (×3): 100 ug via SUBCUTANEOUS
  Filled 2020-01-31 (×3): qty 0.5

## 2020-01-31 MED ORDER — SODIUM CHLORIDE 0.9 % IV SOLN: INTRAVENOUS | Status: DC

## 2020-01-31 MED ORDER — SODIUM BICARBONATE 650 MG PO TABS
650.0000 mg | ORAL_TABLET | Freq: Every day | ORAL | Status: DC
  Administered 2020-01-31: 650 mg via ORAL
  Filled 2020-01-31: qty 1

## 2020-01-31 NOTE — Progress Notes (Signed)
Progress Note    Cindy Burns  HDQ:[id number] DOB: 1946/08/07  DOA: 01/08/2020 PCP: [name], MD    Brief Narrative:    Medical records reviewed and are as summarized below:  Cindy Burns is an 74 y.o. female past medical history significant for breast cancer, COPD, tobacco use, diabetes, MDD admitted April 3 with acute renal failure requiring CRRT, hyperkalemia, anion gap metabolic acidosis secondary to DKA, acute respiratory failure requiring intubation extubated on April 6.  Persistent ongoing kidney failure and currently awaiting renal function recovery.  Assessment/Plan:   Principal Problem:   Renal failure Active Problems:   Hypocalcemia   Metabolic acidosis   Acute respiratory failure with hypoxia (HCC)   CIGARETTE SMOKER   Hypertension   UTI (urinary tract infection)   Pressure injury of skin   Encounter for central line placement   Pain and swelling of ankle, left   #1.  Secondary to severe volume depletion in the setting of DKA/UTI/metabolic acidosis.  Evaluated by nephrology who opined stable low GFR with good urine suggestive of GFR recovery and are hopeful.  No uremia and no strong reason for hemodialysis.  Her potassium did creep up over the last 2 days but today it is within the limits of normal after 2 doses of Kayexalate.  Today nephrology anticipates making patient n.p.o. after midnight in case temporary HD catheter needed for dialysis tomorrow.  In the meantime plan for IV fluids -Fluids per nephrology -Defer further management to nephrology -Monitor  #2.  Acute respiratory failure with hypoxia in the setting of COPD and former tobacco use.  Patient intubated on admission.  Extubated 2 days later.  Oxygen saturation levels remained greater than 90% on room air.  Her last cigarette was 2 days before she was admitted.  Discussed/encouraged patient to continue smoking cessation once discharged -Nebulizers -Incentive spirometry -Mobilize  #3.   Hyperkalemia.  Yesterday her potassium was 6.1 today it was within the limits of normal.  Yesterday she did receive Kayexalate x2.  Also received Lokelma which has been discontinued. Milly Jakob -Renal diet -Monitor  #4.  Urinary tract infection.  Pansensitive E. coli noted on culture.  Blood cultures with no growth to date.  Patient remains afebrile hemodynamically stable and nontoxic-appearing.  She completed a 7-day course of Rocephin  #5.  Normocytic anemia.  Stable.  No sign symptoms of active bleeding  #6.  Diabetes type 2.  Uncontrolled.  Hemoglobin A1c 8.5.  Patient with a history of noncompliance with diabetes medication as well as carb modified diet -Resume home medications upon discharge if creatinine allows  #7.  Left foot drop/pain and swelling of the left ankle.  X-ray of left ankle negative.  MRI of the lumbar spine reveals mild disc bulging at L3 and 4 leftward disc protrusion and annular tear, rightward disc protrusion and annular tear of L5-S1, moderate facet hypertrophy at L5-S1.  Evaluated by neurosurgery who opined no immediate intervention recommended.  Outpatient follow-up with possible EMG.  She was provided with AFO/dorsiflexion brace per PT  #8.  Pressure injury coccyx stage II as well as left heel deep tissue pressure injury.  Partial-thickness loss of dermis.  Evaluated by [place]   Family Communication/Anticipated D/C date and plan/Code Status   DVT prophylaxis: Heparin ordered. Code Status: Full Code.  Family Communication: Patient Disposition Plan: Status is: Inpatient  Remains inpatient appropriate because:IV treatments appropriate due to intensity of illness or inability to take PO   Dispo: The patient  is from: Home              Anticipated d/c is to: Home              Anticipated d/c date is: 2 days              Patient currently is not medically stable to d/c.          Medical Consultants:    None.   Anti-Infectives:    None  Subjective:    Awake alert denies pain or discomfort.  Reports nephrology recommending IV fluids.   Objective:    Vitals:   01/30/20 1627 01/30/20 2136 01/31/20 0648 01/31/20 0854  BP: (!) 145/54 (!) 144/52 (!) 152/63 (!) 168/63  Pulse: 83 82 71 74  Resp: 18 18 16 18   Temp: 98.2 F (36.8 C) 98.3 F (36.8 C) 97.8 F (36.6 C)   TempSrc: Oral Oral Oral   SpO2: 98% 96% 98% 100%  Weight:   54.7 kg   Height:        Intake/Output Summary (Last 24 hours) at 01/31/2020 1001 Last data filed at 01/31/2020 0600 Gross per 24 hour  Intake 360 ml  Output 1601 ml  Net -1241 ml   Filed Weights   01/29/20 2047 01/30/20 0500 01/31/20 0648  Weight: 57.2 kg 57.2 kg 54.7 kg    Exam: General: Slightly pale somewhat chronically ill-appearing no acute distress CV: Regular rate and rhythm no murmur gallop or rub no lower extremity edema Respiratory: No increased work of breathing breath sounds are slightly coarse throughout hear no wheeze no rhonchi Abdomen: Nondistended soft positive bowel sounds throughout nontender to palpation no guarding or rebounding Musculoskeletal: Joints without swelling/erythema brace to left foot intact ambulating in room with steady gait Neuro: Alert oriented x3 speech clear facial symmetry cranial nerves II through XII grossly intact  Data Reviewed:   I have personally reviewed following labs and imaging studies:  Labs: Labs show the following:   Basic Metabolic Panel: Recent Labs  Lab 01/25/20 0416 01/25/20 0416 01/26/20 0401 01/26/20 0401 01/27/20 0334 01/27/20 0334 01/28/20 0429 01/28/20 0429 01/29/20 0341 01/29/20 0341 01/29/20 1518 01/29/20 1518 01/29/20 1837 01/29/20 1837 01/30/20 0458 01/30/20 0458 01/30/20 1540 01/31/20 0613  NA 137   < > 134*   < > 135   < > 136   < > 135   < > 133*  --  133*  --  136  --  138 137  K 4.5   < > 5.0   < > 5.2*   < > 5.3*   < > 6.0*   < > 6.4*   < > 6.6*   < > 5.9*   < > 6.1* 4.5  CL 101   < > 99   < > 98   < > 100    < > 100   < > 97*  --  98  --  101  --  99 97*  CO2 24   < > 21*   < > 24   < > 24   < > 24   < > 23  --  24  --  23  --  29 27  GLUCOSE 103*   < > 93   < > 118*   < > 105*   < > 110*   < > 155*  --  166*  --  106*  --  191* 105*  BUN 78*   < >  83*   < > 87*   < > 90*   < > 92*   < > 93*  --  96*  --  92*  --  92* 85*  CREATININE 6.45*   < > 6.28*   < > 6.69*   < > 6.69*   < > 6.71*   < > 7.01*  --  6.62*  --  7.07*  --  6.90* 6.64*  CALCIUM 7.4*   < > 7.5*   < > 7.9*   < > 8.2*   < > 8.3*   < > 8.6*  --  8.6*  --  8.4*  --  8.5* 8.0*  MG 1.6*  --  2.1  --   --   --   --   --   --   --   --   --   --   --   --   --   --   --   PHOS 3.8   < > 3.7   < > 3.3  --  3.4  --  3.6  --   --   --   --   --  4.3  --   --  5.6*   < > = values in this interval not displayed.   GFR Estimated Creatinine Clearance: 6.1 mL/min (A) (by C-G formula based on SCr of 6.64 mg/dL (H)). Liver Function Tests: Recent Labs  Lab 01/27/20 0334 01/28/20 0429 01/29/20 0341 01/30/20 0458 01/31/20 0613  ALBUMIN 2.0* 2.0* 2.2* 2.2* 2.3*   No results for input(s): LIPASE, AMYLASE in the last 168 hours. No results for input(s): AMMONIA in the last 168 hours. Coagulation profile No results for input(s): INR, PROTIME in the last 168 hours.  CBC: Recent Labs  Lab 01/25/20 0416 01/28/20 0429 01/30/20 0458  WBC 4.5 4.6 5.0  NEUTROABS 2.7  --   --   HGB 7.4* 7.2* 7.5*  HCT 23.5* 23.4* 23.5*  MCV 92.2 94.7 95.5  PLT 256 256 272   Cardiac Enzymes: No results for input(s): CKTOTAL, CKMB, CKMBINDEX, TROPONINI in the last 168 hours. BNP (last 3 results) No results for input(s): PROBNP in the last 8760 hours. CBG: Recent Labs  Lab 01/30/20 0647 01/30/20 1120 01/30/20 1628 01/30/20 2129 01/31/20 0633  GLUCAP 99 113* 164* 91 101*   D-Dimer: No results for input(s): DDIMER in the last 72 hours. Hgb A1c: No results for input(s): HGBA1C in the last 72 hours. Lipid Profile: No results for input(s): CHOL, HDL,  LDLCALC, TRIG, CHOLHDL, LDLDIRECT in the last 72 hours. Thyroid function studies: No results for input(s): TSH, T4TOTAL, T3FREE, THYROIDAB in the last 72 hours.  Invalid input(s): FREET3 Anemia work up: No results for input(s): VITAMINB12, FOLATE, FERRITIN, TIBC, IRON, RETICCTPCT in the last 72 hours. Sepsis Labs: Recent Labs  Lab 01/25/20 0416 01/28/20 0429 01/30/20 0458  WBC 4.5 4.6 5.0    Microbiology No results found for this or any previous visit (from the past 240 hour(s)).  Procedures and diagnostic studies:  No results found.  Medications:   . amLODipine  5 mg Oral Daily  . [START ON 02/03/2020] darbepoetin (ARANESP) injection - NON-DIALYSIS  100 mcg Subcutaneous Q Thu-1800  . enoxaparin (LOVENOX) injection  30 mg Subcutaneous Q24H  . feeding supplement (ENSURE ENLIVE)  237 mL Oral BID BM  . insulin aspart  0-5 Units Subcutaneous QHS  . insulin aspart  0-6 Units Subcutaneous TID WC  . mouth rinse  15  mL Mouth Rinse BID  . multivitamin with minerals  1 tablet Oral Daily  . polyethylene glycol  17 g Oral BID  . senna  2 tablet Oral BID  . sodium bicarbonate  650 mg Oral Daily   Continuous Infusions: . sodium chloride Stopped (01/13/20 0735)  . sodium chloride 75 mL/hr at 01/31/20 0850     LOS: 23 days   Radene Gunning NP [facility]   How to contact the [facility] Attending or Consulting provider [place] or covering provider during after hours [place], for this patient?  1. Check the care team in [facility] and look for a) attending/consulting TRH provider listed and b) the [facility] team listed 2. Log into [email or web address] and use [place]'s universal password to access. If you do not have the password, please contact the hospital operator. 3. Locate the [facility] provider you are looking for under [facility] and page to a number that you can be directly reached. 4. If you still have difficulty reaching the provider, please page the [facility] (Director on Call) for the  Hospitalists listed on amion for assistance.  01/31/2020, 10:01 AM

## 2020-01-31 NOTE — Progress Notes (Signed)
Nephrology Progress Note:    Subjective:  Was NPO after midnight in the event RRT was needed.  Had 1.6 liters UOP over 4/25 as well as 2 unmeasured voids. Got kayexalate x 2 on 4/25. She is ok with being NPO after midnight again tonight and understands no HD today.  Feels ok.  Has been having some tomatoes in salads  Review of systems:  Denies n/v Denies shortness of breath or chest pain Urinating well   04/25 0701 - 04/26 0700 In: 480 [P.O.:480] Out: 1601 [Urine:1600; Stool:1]  Filed Weights   01/29/20 2047 01/30/20 0500 01/31/20 0648  Weight: 57.2 kg 57.2 kg 54.7 kg    Scheduled Meds: . amLODipine  5 mg Oral Daily  . darbepoetin (ARANESP) injection - NON-DIALYSIS  60 mcg Subcutaneous Q Thu-1800  . enoxaparin (LOVENOX) injection  30 mg Subcutaneous Q24H  . feeding supplement (ENSURE ENLIVE)  237 mL Oral BID BM  . insulin aspart  0-5 Units Subcutaneous QHS  . insulin aspart  0-6 Units Subcutaneous TID WC  . mouth rinse  15 mL Mouth Rinse BID  . multivitamin with minerals  1 tablet Oral Daily  . polyethylene glycol  17 g Oral BID  . senna  2 tablet Oral BID  . sodium bicarbonate  1,300 mg Oral BID   Continuous Infusions: . sodium chloride Stopped (01/13/20 0735)   PRN Meds:.sodium chloride, dextrose, ipratropium-albuterol, labetalol   Physical Exam:  General adult female in bed in no acute distress HEENT normocephalic atraumatic extraocular movements intact sclera anicteric Neck supple trachea midline Lungs clear to auscultation bilaterally normal work of breathing at rest  Heart S1S2 no rub Abdomen soft nontender nondistended Extremities no edema  Psych normal mood and affect Neuro - alert and oriented and conversant follows commands  Assessment/Plan:  1. AKI, nl baseline; likely ATN from hypovolemia/hemodynamic insults.  on CRRT through 4/6.  Has been off RRT for over a week but hyperkalemia  - Renal diet today - No acute indication for dialysis and hopeful for  possible improvement with saline  - Ordered normal saline at 75/hr - NPO after midnight tonight in the event access is needed (in that case would need CLIP for AKI)     2. Hyperkalemia: resolved with kayexalate x 2 on 4/25.  Following for RRT needs.  Ordered renal diet - has been getting tomatoes 3. Anemia normocytic    1. Fe replete; setting of AKI   2. ESA Aranesp every Thurs - have increased dose to 100 mcg  4. HTN acceptable; avoid hypotension; reducing bicarb 5. Metabolic Acidosis - reduce bicarb to 650 mg daily and follow  6. DKA resolved 7. LEE resolved 8. DM2 per primary team  9. COPD per primary team  10. Hypomag - resolved last check; check mag in AM    Recent Labs  Lab 01/29/20 0341 01/29/20 1518 01/30/20 0458 01/30/20 1540 01/31/20 0613  NA 135   < > 136 138 137  K 6.0*   < > 5.9* 6.1* 4.5  CL 100   < > 101 99 97*  CO2 24   < > 23 29 27   GLUCOSE 110*   < > 106* 191* 105*  BUN 92*   < > 92* 92* 85*  CREATININE 6.71*   < > 7.07* 6.90* 6.64*  CALCIUM 8.3*   < > 8.4* 8.5* 8.0*  PHOS 3.6  --  4.3  --  5.6*   < > = values in this interval not displayed.  Recent Labs  Lab 01/25/20 0416 01/28/20 0429 01/30/20 0458  WBC 4.5 4.6 5.0  NEUTROABS 2.7  --   --   HGB 7.4* 7.2* 7.5*  HCT 23.5* 23.4* 23.5*  MCV 92.2 94.7 95.5  PLT 256 256 272     Claudia Desanctis, MD   01/31/2020 8:10 AM

## 2020-01-31 NOTE — Plan of Care (Signed)
  Problem: Education: Goal: Knowledge of General Education information will improve Description: Including pain rating scale, medication(s)/side effects and non-pharmacologic comfort measures Outcome: Progressing   Problem: Clinical Measurements: Goal: Ability to maintain clinical measurements within normal limits will improve Outcome: Progressing   Problem: Coping: Goal: Level of anxiety will decrease Outcome: Progressing   

## 2020-01-31 NOTE — Progress Notes (Signed)
Occupational Therapy Treatment Patient Details Name: Cindy Burns MRN: [id number] DOB: 01/09/1946 Today's Date: 01/31/2020    History of present illness Pt is a 74 year old woman admitted on 01/08/20 to [facility] and subsequently transferred to [facility] with respiratory failure requring intubation, DKA, UTI, hyperkalemia and renal failure requiring CRRT. Extubated 01/11/20. PMH: COPD, breast cancer, Dm, MDD.   OT comments  Pt making excellent progress. Completing ADL tasks with overall S with improved activity tolerance. Has difficulty donning L AFO due to toe drag in shoe. Feel pt would benefit from use of 4 wheeled walker/rollator to help with ADL and IADL tasks at home. Pt states she has friends who can assist with meals as needed. Feel she would also benefit from [place] as she is discharging home alone. Will continue to follow acutely.   Follow Up Recommendations  Home health OT;Supervision - Intermittent(HH Aide)    Equipment Recommendations  Other (comment)(rollator)    Recommendations for Other Services      Precautions / Restrictions Precautions Precautions: Fall Required Braces or Orthoses: Other Brace Other Brace: AFO Restrictions Weight Bearing Restrictions: No       Mobility Bed Mobility Overal bed mobility: Modified Independent                Transfers Overall transfer level: Needs assistance     Sit to Stand: Supervision              Balance Overall balance assessment: Mild deficits observed, not formally tested                                         ADL either performed or assessed with clinical judgement   ADL Overall ADL's : Needs assistance/impaired     Grooming: Wash/dry hands;Wash/dry face;Supervision/safety;Standing   Upper Body Bathing: Supervision/ safety;Standing   Lower Body Bathing: Supervison/ safety;Sit to/from stand   Upper Body Dressing : Supervision/safety;Sitting   Lower Body Dressing: Minimal assistance Lower  Body Dressing Details (indicate cue type and reason): difficulty with donning AFO due to toe drag. Discussed possibility of trying thin socks with a fitted toe box to help prevent toe drag. May benefit form use of K tape to help with positioning of toe in shoe - will continue to assess Toilet Transfer: Supervision/safety;Ambulation;Comfort height toilet;RW   Toileting- Clothing Manipulation and Hygiene: Modified independent       Functional mobility during ADLs: Supervision/safety       Vision       Perception     Praxis      Cognition Arousal/Alertness: Awake/alert Behavior During Therapy: WFL for tasks assessed/performed Overall Cognitive Status: No family/caregiver present to determine baseline cognitive functioning                                 General Comments: Pt unaware of discharge plan for home; slow processing        Exercises     Shoulder Instructions       General Comments      Pertinent Vitals/ Pain       Pain Assessment: No/denies pain  Home Living  Prior Functioning/Environment              Frequency  Min 2X/week        Progress Toward Goals  OT Goals(current goals can now be found in the care plan section)  Progress towards OT goals: Progressing toward goals;Goals met and updated - see care plan  Acute Rehab OT Goals Patient Stated Goal: to get betterand go home to her puppies OT Goal Formulation: With patient Time For Goal Achievement: 02/14/20 Potential to Achieve Goals: Good ADL Goals Pt Will Perform Lower Body Bathing: with modified independence;sit to/from stand Pt Will Perform Lower Body Dressing: with modified independence;sit to/from stand Pt Will Transfer to Toilet: with modified independence;ambulating Pt Will Perform Tub/Shower Transfer: Tub transfer;with modified independence;3 in 1;ambulating Additional ADL Goal #1: Pt will independently  verbalize 3 strategies to reduce risk of falls  Plan Discharge plan needs to be updated    Co-evaluation                 AM-PAC OT "6 Clicks" Daily Activity     Outcome Measure   Help from another person eating meals?: None Help from another person taking care of personal grooming?: A Little Help from another person toileting, which includes using toliet, bedpan, or urinal?: A Little Help from another person bathing (including washing, rinsing, drying)?: A Little Help from another person to put on and taking off regular upper body clothing?: A Little Help from another person to put on and taking off regular lower body clothing?: A Little 6 Click Score: 19    End of Session Equipment Utilized During Treatment: Rolling walker  OT Visit Diagnosis: Muscle weakness (generalized) (M62.81);Other symptoms and signs involving cognitive function;Unsteadiness on feet (R26.81)   Activity Tolerance Patient tolerated treatment well   Patient Left in chair;with call bell/phone within reach;with chair alarm set   Nurse Communication Mobility status;Other (comment)(DC needs)        Time: 4239-5320 OT Time Calculation (min): 25 min  Charges: OT General Charges $OT Visit: 1 Visit OT Treatments $Self Care/Home Management : 23-37 mins  Maurie Boettcher, OT/L   Acute OT Clinical Specialist Acute Rehabilitation Services Pager [phone] Office [phone]    [facility] 01/31/2020, 11:21 AM

## 2020-01-31 NOTE — Progress Notes (Signed)
Physical Therapy Treatment Patient Details Name: Cindy Burns MRN: [id number] DOB: [birth year] Today's Date: 01/31/2020    History of Present Illness Pt is a 74 year old woman admitted on 01/08/20 to [facility] and subsequently transferred to [facility] with respiratory failure requring intubation, DKA, UTI, hyperkalemia and renal failure requiring CRRT. Extubated 01/11/20. PMH: COPD, breast cancer, Dm, MDD.    PT Comments    Continuing work on functional mobility and activity tolerance;  Excellent progress with progressive amb; Walked with Rollator today to see if she prefers it to a regular RW; since she lives alone, a rollator will potentially be better fo rADLs and IADLs; she seems to like the Rollator  Follow Up Recommendations  Home health PT;Supervision - Intermittent  Agree with HHAide as she lives alone     Equipment Recommendations  3in1 (PT);Other (comment)(Rollator RW)    Recommendations for Other Services       Precautions / Restrictions Precautions Precautions: Fall Required Braces or Orthoses: Other Brace Other Brace: AFO    Mobility  Bed Mobility Overal bed mobility: Modified Independent                Transfers Overall transfer level: Needs assistance     Sit to Stand: Supervision         General transfer comment: Stood to [employer]; cues for brake use and safety  Ambulation/Gait Ambulation/Gait assistance: Scientist, forensic (Feet): 250 Feet Assistive device: 4-wheeled walker Gait Pattern/deviations: Step-through pattern     General Gait Details: Good foot clearance with AFO; did require assist to don shoes due to tight with hospital socks; min cues for posture   Stairs             Wheelchair Mobility    Modified Rankin (Stroke Patients Only)       Balance                                            Cognition Arousal/Alertness: Awake/alert Behavior During Therapy: WFL for tasks assessed/performed Overall  Cognitive Status: No family/caregiver present to determine baseline cognitive functioning                                 General Comments: Pt unaware of discharge plan for home; slow processing      Exercises      General Comments General comments (skin integrity, edema, etc.): Donned AFO without a sock; noted toes tending to get bunched, with laces loose, able to reach in and free up great toe for better comfort; will consider getting TED hose, and seeing if that is helpful      Pertinent Vitals/Pain Pain Assessment: No/denies pain    Home Living                      Prior Function            PT Goals (current goals can now be found in the care plan section) Acute Rehab PT Goals Patient Stated Goal: to get betterand go home to her puppies PT Goal Formulation: With patient Time For Goal Achievement: 02/09/20 Potential to Achieve Goals: Good Progress towards PT goals: Progressing toward goals    Frequency    Min 3X/week      PT Plan Current plan remains appropriate  Co-evaluation              AM-PAC PT "6 Clicks" Mobility   Outcome Measure  Help needed turning from your back to your side while in a flat bed without using bedrails?: None Help needed moving from lying on your back to sitting on the side of a flat bed without using bedrails?: None Help needed moving to and from a bed to a chair (including a wheelchair)?: None Help needed standing up from a chair using your arms (e.g., wheelchair or bedside chair)?: None Help needed to walk in hospital room?: None Help needed climbing 3-5 steps with a railing? : A Little 6 Click Score: 23    End of Session Equipment Utilized During Treatment: Gait belt Activity Tolerance: Patient tolerated treatment well Patient left: in chair;with call bell/phone within reach;with chair alarm set Nurse Communication: Mobility status PT Visit Diagnosis: Other abnormalities of gait and mobility  (R26.89);Unsteadiness on feet (R26.81);Muscle weakness (generalized) (M62.81)     Time:  -     Charges:                        Roney Marion, [place] Pager [phone] Office [phone]    Colletta [place] 01/31/2020, 6:01 PM

## 2020-02-01 ENCOUNTER — Inpatient Hospital Stay (HOSPITAL_COMMUNITY): Payer: Medicare Other

## 2020-02-01 DIAGNOSIS — N179 Acute kidney failure, unspecified: Secondary | ICD-10-CM | POA: Diagnosis not present

## 2020-02-01 HISTORY — PX: IR US GUIDE VASC ACCESS RIGHT: IMG2390

## 2020-02-01 HISTORY — PX: IR FLUORO GUIDE CV LINE RIGHT: IMG2283

## 2020-02-01 LAB — URINALYSIS, COMPLETE (UACMP) WITH MICROSCOPIC
Bilirubin Urine: NEGATIVE
Glucose, UA: NEGATIVE mg/dL
Hgb urine dipstick: NEGATIVE
Ketones, ur: NEGATIVE mg/dL
Nitrite: NEGATIVE
Protein, ur: 30 mg/dL — AB
Specific Gravity, Urine: 1.006 (ref 1.005–1.030)
pH: 9 — ABNORMAL HIGH (ref 5.0–8.0)

## 2020-02-01 LAB — RENAL FUNCTION PANEL
Albumin: 2.3 g/dL — ABNORMAL LOW (ref 3.5–5.0)
Anion gap: 13 (ref 5–15)
BUN: 81 mg/dL — ABNORMAL HIGH (ref 8–23)
CO2: 24 mmol/L (ref 22–32)
Calcium: 7.9 mg/dL — ABNORMAL LOW (ref 8.9–10.3)
Chloride: 100 mmol/L (ref 98–111)
Creatinine, Ser: 6.58 mg/dL — ABNORMAL HIGH (ref 0.44–1.00)
GFR calc Af Amer: 7 mL/min — ABNORMAL LOW (ref >60)
GFR calc non Af Amer: 6 mL/min — ABNORMAL LOW (ref >60)
Glucose, Bld: 101 mg/dL — ABNORMAL HIGH (ref 70–99)
Phosphorus: 5.4 mg/dL — ABNORMAL HIGH (ref 2.5–4.6)
Potassium: 4.7 mmol/L (ref 3.5–5.1)
Sodium: 137 mmol/L (ref 135–145)

## 2020-02-01 LAB — GLUCOSE, CAPILLARY
Glucose-Capillary: 101 mg/dL — ABNORMAL HIGH (ref 70–99)
Glucose-Capillary: 94 mg/dL (ref 70–99)
Glucose-Capillary: 110 mg/dL — ABNORMAL HIGH (ref 70–99)
Glucose-Capillary: 211 mg/dL — ABNORMAL HIGH (ref 70–99)

## 2020-02-01 LAB — PROTIME-INR
INR: 1.1 (ref 0.8–1.2)
Prothrombin Time: 13.4 seconds (ref 11.4–15.2)

## 2020-02-01 LAB — PROTEIN / CREATININE RATIO, URINE
Creatinine, Urine: 24.66 mg/dL
Protein Creatinine Ratio: 2.11 mg/mg{Cre} — ABNORMAL HIGH (ref 0.00–0.15)
Total Protein, Urine: 52 mg/dL

## 2020-02-01 LAB — HEPATITIS B SURFACE ANTIGEN: Hepatitis B Surface Ag: NONREACTIVE

## 2020-02-01 LAB — HEPATITIS B SURFACE ANTIBODY,QUALITATIVE: Hep B S Ab: NONREACTIVE

## 2020-02-01 LAB — MAGNESIUM: Magnesium: 1.6 mg/dL — ABNORMAL LOW (ref 1.7–2.4)

## 2020-02-01 MED ORDER — CHLORHEXIDINE GLUCONATE CLOTH 2 % EX PADS
6.0000 | MEDICATED_PAD | Freq: Every day | CUTANEOUS | Status: DC
  Administered 2020-02-04 – 2020-02-17 (×9): 6 via TOPICAL

## 2020-02-01 MED ORDER — MIDAZOLAM HCL 2 MG/2ML IJ SOLN
INTRAMUSCULAR | Status: AC | PRN
  Administered 2020-02-01 (×2): 1 mg via INTRAVENOUS

## 2020-02-01 MED ORDER — LIDOCAINE HCL (PF) 1 % IJ SOLN: 5.0000 mL | INTRAMUSCULAR | Status: DC | PRN

## 2020-02-01 MED ORDER — PENTAFLUOROPROP-TETRAFLUOROETH EX AERO: 1.0000 "application " | INHALATION_SPRAY | CUTANEOUS | Status: DC | PRN

## 2020-02-01 MED ORDER — SODIUM CHLORIDE 0.9 % IV SOLN: 100.0000 mL | INTRAVENOUS | Status: DC | PRN

## 2020-02-01 MED ORDER — FENTANYL CITRATE (PF) 100 MCG/2ML IJ SOLN
INTRAMUSCULAR | Status: AC | PRN
  Administered 2020-02-01 (×2): 50 ug via INTRAVENOUS

## 2020-02-01 MED ORDER — MIDAZOLAM HCL 2 MG/2ML IJ SOLN
INTRAMUSCULAR | Status: AC
  Filled 2020-02-01: qty 4

## 2020-02-01 MED ORDER — HEPARIN SODIUM (PORCINE) 1000 UNIT/ML IJ SOLN
INTRAMUSCULAR | Status: AC
  Filled 2020-02-01: qty 1

## 2020-02-01 MED ORDER — CHLORHEXIDINE GLUCONATE CLOTH 2 % EX PADS
6.0000 | MEDICATED_PAD | Freq: Every day | CUTANEOUS | Status: DC
  Administered 2020-02-01 – 2020-02-03 (×3): 6 via TOPICAL

## 2020-02-01 MED ORDER — LIDOCAINE HCL 1 % IJ SOLN
INTRAMUSCULAR | Status: AC | PRN
  Administered 2020-02-01: 10 mL

## 2020-02-01 MED ORDER — LIDOCAINE-PRILOCAINE 2.5-2.5 % EX CREA: 1.0000 "application " | TOPICAL_CREAM | CUTANEOUS | Status: DC | PRN

## 2020-02-01 MED ORDER — HEPARIN SODIUM (PORCINE) 1000 UNIT/ML DIALYSIS: 1000.0000 [IU] | INTRAMUSCULAR | Status: DC | PRN

## 2020-02-01 MED ORDER — ALTEPLASE 2 MG IJ SOLR: 2.0000 mg | Freq: Once | INTRAMUSCULAR | Status: DC | PRN

## 2020-02-01 MED ORDER — LIDOCAINE HCL 1 % IJ SOLN
INTRAMUSCULAR | Status: AC
  Filled 2020-02-01: qty 20

## 2020-02-01 MED ORDER — FENTANYL CITRATE (PF) 100 MCG/2ML IJ SOLN
INTRAMUSCULAR | Status: AC
  Filled 2020-02-01: qty 2

## 2020-02-01 MED ORDER — MAGNESIUM SULFATE IN D5W 1-5 GM/100ML-% IV SOLN
1.0000 g | Freq: Once | INTRAVENOUS | Status: AC
  Administered 2020-02-01: 1 g via INTRAVENOUS
  Filled 2020-02-01: qty 100

## 2020-02-01 MED ORDER — NEPRO/CARBSTEADY PO LIQD
237.0000 mL | Freq: Two times a day (BID) | ORAL | Status: DC
  Administered 2020-02-02 – 2020-02-08 (×10): 237 mL via ORAL

## 2020-02-01 MED ORDER — CEFAZOLIN SODIUM-DEXTROSE 2-4 GM/100ML-% IV SOLN
2.0000 g | INTRAVENOUS | Status: AC
  Administered 2020-02-01: 2 g via INTRAVENOUS
  Filled 2020-02-01: qty 100

## 2020-02-01 NOTE — Progress Notes (Signed)
Pt off the unit, for IR for HD access placement.    Paulla Fore, RN, BSN

## 2020-02-01 NOTE — Progress Notes (Signed)
Nephrology Progress Note:    Subjective:  Was NPO after midnight in the event RRT was needed.  Had 420 mL UOP over 4/26 as well as 4 unmeasured voids.   Has been on NS at 75/hr.  She and I discussed risks and benefits of dialysis and she is consents to proceed with HD.  Review of systems:    Denies n/v Denies shortness of breath or chest pain Urinating well   04/26 0701 - 04/27 0700 In: 980 [P.O.:980] Out: 420 [Urine:420]  Filed Weights   01/29/20 2047 01/30/20 0500 01/31/20 0648  Weight: 57.2 kg 57.2 kg 54.7 kg    Scheduled Meds: . amLODipine  5 mg Oral Daily  . [START ON 02/03/2020] darbepoetin (ARANESP) injection - NON-DIALYSIS  100 mcg Subcutaneous Q Thu-1800  . enoxaparin (LOVENOX) injection  30 mg Subcutaneous Q24H  . feeding supplement (ENSURE ENLIVE)  237 mL Oral BID BM  . insulin aspart  0-5 Units Subcutaneous QHS  . insulin aspart  0-6 Units Subcutaneous TID WC  . mouth rinse  15 mL Mouth Rinse BID  . multivitamin with minerals  1 tablet Oral Daily  . polyethylene glycol  17 g Oral BID  . senna  2 tablet Oral BID  . sodium bicarbonate  650 mg Oral Daily   Continuous Infusions: . sodium chloride Stopped (01/13/20 0735)  . sodium chloride 75 mL/hr at 01/31/20 2143   PRN Meds:.sodium chloride, dextrose, ipratropium-albuterol, labetalol   Physical Exam:   General adult female in bed in no acute distress HEENT normocephalic atraumatic extraocular movements intact sclera anicteric Neck supple trachea midline Lungs clear to auscultation bilaterally normal work of breathing at rest  Heart S1S2 no rub Abdomen soft nontender nondistended Extremities no edema  Psych normal mood and affect Neuro - alert and oriented and conversant follows commands  Assessment/Plan:  1. AKI, nl baseline; previously felt likely ATN from hypovolemia/hemodynamic insults.  on CRRT through 4/6.  No iHD noted.  Has been off RRT for over a week but hyperkalemia  - Renal diet when taking PO   - Stopped normal saline - Consulted IR today for tunneled catheter for hemodialysis - NPO  - HD today and tomorrow and reassess needs daily - Check UA and up/c ratio  - May benefit from a renal biopsy - follow trends - If does remain on HD renal will need to CLIP as AKI    2. Hyperkalemia: resolved with kayexalate x 2 on 4/25.  renal diet.  Stopped her multivitamin.  3. Anemia normocytic    1. Fe replete; setting of AKI   2. ESA Aranesp every Thurs - have increased dose to 100 mcg starting with 4/29 dose  4. HTN avoid hypotension; discontinued bicarb  5. Metabolic Acidosis - resolved; discontinue bicarb 6. DKA resolved 7. DM2 per primary team  8. COPD per primary team  9. Hypomag - mild; repletion ordered    Recent Labs  Lab 01/30/20 0458 01/30/20 0458 01/30/20 1540 01/31/20 0613 02/01/20 0520  NA 136   < > 138 137 137  K 5.9*   < > 6.1* 4.5 4.7  CL 101   < > 99 97* 100  CO2 23   < > 29 27 24   GLUCOSE 106*   < > 191* 105* 101*  BUN 92*   < > 92* 85* 81*  CREATININE 7.07*   < > 6.90* 6.64* 6.58*  CALCIUM 8.4*   < > 8.5* 8.0* 7.9*  PHOS 4.3  --   --  5.6* 5.4*   < > = values in this interval not displayed.   Recent Labs  Lab 01/28/20 0429 01/30/20 0458  WBC 4.6 5.0  HGB 7.2* 7.5*  HCT 23.4* 23.5*  MCV 94.7 95.5  PLT 256 272     Claudia Desanctis, MD   02/01/2020 7:52 AM

## 2020-02-01 NOTE — Progress Notes (Signed)
Physical Therapy Treatment Patient Details Name: Cindy Burns MRN: [id number] DOB: 06-12-1946 Today's Date: 02/01/2020    History of Present Illness Pt is a 74 year old woman admitted on 01/08/20 to [facility] and subsequently transferred to [facility] with respiratory failure requring intubation, DKA, UTI, hyperkalemia and renal failure requiring CRRT. Extubated 01/11/20. PMH: COPD, breast cancer, Dm, MDD.    PT Comments    Patient received in bed, agreeable to PT, reports she is waiting for procedure and hasn't been able to eat at all today. She is independent with bed mobility, transfers with supervision. Transferred to [facility], then up to rollator. She was able to ambulated 250 feet with rollator, sba. Mild fatigue with activity. Seated LE exercises performed after ambulation. She will continue to benefit from skilled PT while here to improve strength and functional independence.       Follow Up Recommendations  Home health PT;Supervision - Intermittent     Equipment Recommendations  3in1 (PT);Other (comment)(rollator)    Recommendations for Other Services       Precautions / Restrictions Precautions Precautions: Fall Other Brace: patient did not want to use AFO today Restrictions Weight Bearing Restrictions: No    Mobility  Bed Mobility Overal bed mobility: Independent Bed Mobility: Supine to Sit     Supine to sit: Modified independent (Device/Increase time)     General bed mobility comments: no physical assist needed  Transfers Overall transfer level: Needs assistance Equipment used: Rolling walker (2 wheeled) Transfers: Sit to/from Stand Sit to Stand: Supervision         General transfer comment: Stood to [employer]; cues for brake use and safety  Ambulation/Gait Ambulation/Gait assistance: Scientist, forensic (Feet): 250 Feet Assistive device: 4-wheeled walker Gait Pattern/deviations: Step-through pattern Gait velocity: WFL   General Gait Details: patient  without difficulty not using AFO, manages foot drop well. States she does not wear shoes and AFO unless she is outisde of her home.   Stairs             Wheelchair Mobility    Modified Rankin (Stroke Patients Only)       Balance Overall balance assessment: Modified Independent Sitting-balance support: Feet supported Sitting balance-Leahy Scale: Good     Standing balance support: Bilateral upper extremity supported;During functional activity Standing balance-Leahy Scale: Fair Standing balance comment: reliant on UE support                            Cognition Arousal/Alertness: Awake/alert Behavior During Therapy: WFL for tasks assessed/performed Overall Cognitive Status: Within Functional Limits for tasks assessed                                 General Comments: Pt unaware of discharge plan for home; slow processing      Exercises Other Exercises Other Exercises: seated LAQ, marching. Long sitting SLR x 10 reps each bilaterally    General Comments        Pertinent Vitals/Pain Pain Assessment: No/denies pain    Home Living                      Prior Function            PT Goals (current goals can now be found in the care plan section) Acute Rehab PT Goals Patient Stated Goal: to get betterand go home to her puppies PT  Goal Formulation: With patient Time For Goal Achievement: 02/09/20 Potential to Achieve Goals: Good Progress towards PT goals: Progressing toward goals    Frequency    Min 3X/week      PT Plan Current plan remains appropriate    Co-evaluation              AM-PAC PT "6 Clicks" Mobility   Outcome Measure  Help needed turning from your back to your side while in a flat bed without using bedrails?: None Help needed moving from lying on your back to sitting on the side of a flat bed without using bedrails?: None Help needed moving to and from a bed to a chair (including a wheelchair)?:  None Help needed standing up from a chair using your arms (e.g., wheelchair or bedside chair)?: None Help needed to walk in hospital room?: None Help needed climbing 3-5 steps with a railing? : A Little 6 Click Score: 23    End of Session Equipment Utilized During Treatment: Gait belt Activity Tolerance: Patient tolerated treatment well Patient left: in chair;with call bell/phone within reach;with chair alarm set Nurse Communication: Mobility status PT Visit Diagnosis: Muscle weakness (generalized) (M62.81)     Time: 1205-1228 PT Time Calculation (min) (ACUTE ONLY): 23 min  Charges:  $Gait Training: 8-22 mins $Therapeutic Exercise: 8-22 mins                     Ki Luckman, PT, GCS 02/01/20,1:44 PM

## 2020-02-01 NOTE — Procedures (Signed)
Interventional Radiology Procedure Note  Procedure: Right IJ tunneled HD catheter placement  Complications: None  Estimated Blood Loss: < 10 mL  Findings: 23 cm tip to cuff length Palindrome catheter via right IJ with tip in RA. OK to use.  Venetia Night. Kathlene Cote, M.D Pager:  [phone]

## 2020-02-01 NOTE — Progress Notes (Signed)
Nutrition Follow-up  DOCUMENTATION CODES:   Not applicable  INTERVENTION:  D/c Ensure Enlive po BID, each supplement provides 350 kcal and 20 grams of protein  Nepro Shake po BID, each supplement provides 425 kcal and 19 grams protein   NUTRITION DIAGNOSIS:   Inadequate oral intake related to inability to eat as evidenced by NPO status.  Progressing.  GOAL:   Patient will meet greater than or equal to 90% of their needs  Progressing.  MONITOR:   PO intake, Supplement acceptance, Skin  REASON FOR ASSESSMENT:   Ventilator, Consult Enteral/tube feeding initiation and management  ASSESSMENT:   74 yo female admitted with respiratory failure, DKA, UTI, AKI. Intubated on admission. PMH includes COPD, DM, breast cancer.  Per RN, pt is in IR for HD access placement today due to hyperkalemia. Pt to receive iHD today and tomorrow.   Pt reports appetite is good and that she is enjoying her supplements.  MVI stopped by nephrology.   PO intake: 0-100% x last 6 recorded meals (pt NPO today; 67.5% average meal intake)  Current wt: 54.7 kg Admission wt: 52.9 kg  UOP:1449ml x24 hours I/O: +8435ml since admit  Labs: Phosphorus 5.4 (H), Mg 1.6 (L) CBGs 94-164  Medications reviewed and include: Aransep, Ensure Enlive BID, Novolog, Miralax, Senokot  Diet Order:   Diet Order            Diet NPO time specified  Diet effective midnight              EDUCATION NEEDS:   Not appropriate for education at this time  Skin:  Skin Assessment: Skin Integrity Issues: Skin Integrity Issues:: DTI, Stage II, Other (Comment) DTI: L heel Stage II: coccyx Other: wound to L great toe; MASD to groin, periuneum, buttocks  Last BM:  4/26  Height:   Ht Readings from Last 1 Encounters:  01/17/20 5\' 2"  (1.575 m)    Weight:   Wt Readings from Last 1 Encounters:  01/31/20 54.7 kg    BMI:  Body mass index is 22.04 kg/m.  Estimated Nutritional Needs:   Kcal:   1400-1600  Protein:  80-90 gm  Fluid:  >/= 1.5 L   Larkin Ina, MS, RD, LDN RD pager number and weekend/on-call pager number located in [place].

## 2020-02-01 NOTE — Progress Notes (Signed)
PROGRESS NOTE    Cindy Burns  BWL:[id number] DOB: 06-18-1946 DOA: 01/08/2020 PCP: [name], MD   Brief Narrative:  Cindy Burns is an 74 y.o. female past medical history significant for breast cancer, COPD, tobacco use, diabetes, MDD admitted April 3 with acute renal failure requiring CRRT, hyperkalemia, anion gap metabolic acidosis secondary to DKA, acute respiratory failure requiring intubation extubated on April 6.  Persistent ongoing kidney failure.  Nephrology on board.  Plan for temporary dialysis catheter placement and then HD today and tomorrow.  IR consulted.  Assessment & Plan:   Principal Problem:   Renal failure Active Problems:   CIGARETTE SMOKER   Hypertension   Pressure injury of skin   Encounter for central line placement   Pain and swelling of ankle, left   Hypocalcemia   Metabolic acidosis   Acute respiratory failure with hypoxia (HCC)   UTI (urinary tract infection)   #1.  Acute kidney injury secondary to severe volume depletion in the setting of DKA/UTI/metabolic acidosis.  He was on CRRT through 01/11/2020.  Has been off of the RRT for over a week but had intermittent hyperkalemia.  Nephrology now plans IHD.  They have consulted IR for temporary dialysis catheter placement and then plan for dialysis today and tomorrow.  Appreciate nephrology and IR help.  #2.  Acute respiratory failure with hypoxia in the setting of COPD and former tobacco use.  Patient intubated on admission.  Extubated 2 days later.  Oxygen saturation levels remained greater than 90% on room air.  Her last cigarette was 2 days before she was admitted.  Discussed/encouraged patient to continue smoking cessation once discharged -Nebulizers -Incentive spirometry -Mobilize  #3.  Hyperkalemia.  Resolved.  #4.  Urinary tract infection.  Pansensitive E. coli noted on culture.  Blood cultures with no growth to date.  Patient remains afebrile hemodynamically stable and nontoxic-appearing.   She completed a 7-day course of Rocephin  #5.  Normocytic anemia.  Stable.  No sign symptoms of active bleeding  #6.  Diabetes type 2.  Uncontrolled.  Hemoglobin A1c 8.5.  Patient with a history of noncompliance with diabetes medication as well as carb modified diet.  Blood sugar labile.  Continue SSI.  #7.  Left foot drop/pain and swelling of the left ankle.  X-ray of left ankle negative.  MRI of the lumbar spine reveals mild disc bulging at L3 and 4 leftward disc protrusion and annular tear, rightward disc protrusion and annular tear of L5-S1, moderate facet hypertrophy at L5-S1.  Evaluated by neurosurgery who opined no immediate intervention recommended.  Outpatient follow-up with possible EMG.  She was provided with AFO/dorsiflexion brace per PT  #8.  Pressure injury coccyx stage II as well as left heel deep tissue pressure injury.  Partial-thickness loss of dermis.  Evaluated by WOC  DVT prophylaxis: Heparin   Code Status: Full Code  Family Communication:  None present at bedside.  Plan of care discussed with patient in length and he verbalized understanding and agreed with it. Patient is from: Home Disposition Plan: To be determined Barriers to discharge: Persistent worsening renal function requiring IHD  Status is: Inpatient  Remains inpatient appropriate because:Inpatient level of care appropriate due to severity of illness   Dispo: The patient is from: Home              Anticipated d/c is to: TBD              Anticipated d/c date is: > 3  days              Patient currently is not medically stable to d/c.         Estimated body mass index is 22.04 kg/m as calculated from the following:   Height as of this encounter: 5\' 2"  (1.575 m).   Weight as of this encounter: 54.7 kg.  Pressure Injury 01/08/20 Coccyx Medial Stage 2 -  Partial thickness loss of dermis presenting as a shallow open injury with a red, pink wound bed without slough. small area of broken skin on  coccyx (Active)  01/08/20 2200  Location: Coccyx  Location Orientation: Medial  Staging: Stage 2 -  Partial thickness loss of dermis presenting as a shallow open injury with a red, pink wound bed without slough.  Wound Description (Comments): small area of broken skin on coccyx  Present on Admission: Yes     Pressure Injury 01/09/20 Heel Left Deep Tissue Pressure Injury - Purple or maroon localized area of discolored intact skin or blood-filled blister due to damage of underlying soft tissue from pressure and/or shear. purplr color with red circle around it (Active)  01/09/20 1000  Location: Heel  Location Orientation: Left  Staging: Deep Tissue Pressure Injury - Purple or maroon localized area of discolored intact skin or blood-filled blister due to damage of underlying soft tissue from pressure and/or shear.  Wound Description (Comments): purplr color with red circle around it  Present on Admission: Yes     Nutritional status:  Nutrition Problem: Inadequate oral intake Etiology: inability to eat   Signs/Symptoms: NPO status   Interventions: Tube feeding, MVI    Consultants:   Nephrology and IR  Procedures:     Antimicrobials:  Anti-infectives (From admission, onward)   Start     Dose/Rate Route Frequency Ordered Stop   02/01/20 0900  ceFAZolin (ANCEF) IVPB 2g/100 mL premix     2 g 200 mL/hr over 30 Minutes Intravenous To Radiology 02/01/20 0853 02/02/20 0900   01/08/20 2315  cefTRIAXone (ROCEPHIN) 1 g in sodium chloride 0.9 % 100 mL IVPB     1 g 200 mL/hr over 30 Minutes Intravenous Every 24 hours 01/08/20 2224 01/14/20 2313         Subjective: Patient seen and examined.  She has no complaint.  Objective: Vitals:   01/31/20 1620 01/31/20 2041 02/01/20 0427 02/01/20 1053  BP: (!) 155/60 (!) 157/62 (!) 148/61 (!) 142/66  Pulse: 79 78 77   Resp: 17 18 16    Temp: 97.8 F (36.6 C) 98.2 F (36.8 C) 98.4 F (36.9 C)   TempSrc: Oral Oral Oral   SpO2: 98%  97% 98% 100%  Weight:      Height:        Intake/Output Summary (Last 24 hours) at 02/01/2020 1150 Last data filed at 02/01/2020 1135 Gross per 24 hour  Intake 780 ml  Output 1470 ml  Net -690 ml   Filed Weights   01/29/20 2047 01/30/20 0500 01/31/20 0648  Weight: 57.2 kg 57.2 kg 54.7 kg    Examination:  General exam: Appears calm and comfortable  Respiratory system: Clear to auscultation. Respiratory effort normal. Cardiovascular system: S1 & S2 heard, RRR. No JVD, murmurs, rubs, gallops or clicks. No pedal edema. Gastrointestinal system: Abdomen is nondistended, soft and nontender. No organomegaly or masses felt. Normal bowel sounds heard. Central nervous system: Alert and oriented. No focal neurological deficits. Extremities: Symmetric 5 x 5 power. Skin: No rashes, lesions or ulcers Psychiatry:  Judgement and insight appear normal. Mood & affect appropriate.    Data Reviewed: I have personally reviewed following labs and imaging studies  CBC: Recent Labs  Lab 01/28/20 0429 01/30/20 0458  WBC 4.6 5.0  HGB 7.2* 7.5*  HCT 23.4* 23.5*  MCV 94.7 95.5  PLT 256 585   Basic Metabolic Panel: Recent Labs  Lab 01/26/20 0401 01/27/20 0334 01/28/20 0429 01/28/20 0429 01/29/20 0341 01/29/20 1518 01/29/20 1837 01/30/20 0458 01/30/20 1540 01/31/20 0613 02/01/20 0520  NA 134*   < > 136   < > 135   < > 133* 136 138 137 137  K 5.0   < > 5.3*   < > 6.0*   < > 6.6* 5.9* 6.1* 4.5 4.7  CL 99   < > 100   < > 100   < > 98 101 99 97* 100  CO2 21*   < > 24   < > 24   < > 24 23 29 27 24   GLUCOSE 93   < > 105*   < > 110*   < > 166* 106* 191* 105* 101*  BUN 83*   < > 90*   < > 92*   < > 96* 92* 92* 85* 81*  CREATININE 6.28*   < > 6.69*   < > 6.71*   < > 6.62* 7.07* 6.90* 6.64* 6.58*  CALCIUM 7.5*   < > 8.2*   < > 8.3*   < > 8.6* 8.4* 8.5* 8.0* 7.9*  MG 2.1  --   --   --   --   --   --   --   --   --  1.6*  PHOS 3.7   < > 3.4  --  3.6  --   --  4.3  --  5.6* 5.4*   < > = values in  this interval not displayed.   GFR: Estimated Creatinine Clearance: 6.2 mL/min (A) (by C-G formula based on SCr of 6.58 mg/dL (H)). Liver Function Tests: Recent Labs  Lab 01/28/20 0429 01/29/20 0341 01/30/20 0458 01/31/20 0613 02/01/20 0520  ALBUMIN 2.0* 2.2* 2.2* 2.3* 2.3*   No results for input(s): LIPASE, AMYLASE in the last 168 hours. No results for input(s): AMMONIA in the last 168 hours. Coagulation Profile: Recent Labs  Lab 02/01/20 0938  INR 1.1   Cardiac Enzymes: No results for input(s): CKTOTAL, CKMB, CKMBINDEX, TROPONINI in the last 168 hours. BNP (last 3 results) No results for input(s): PROBNP in the last 8760 hours. HbA1C: No results for input(s): HGBA1C in the last 72 hours. CBG: Recent Labs  Lab 01/31/20 1118 01/31/20 1638 01/31/20 2052 02/01/20 0646 02/01/20 1114  GLUCAP 248* 74 164* 101* 94   Lipid Profile: No results for input(s): CHOL, HDL, LDLCALC, TRIG, CHOLHDL, LDLDIRECT in the last 72 hours. Thyroid Function Tests: No results for input(s): TSH, T4TOTAL, FREET4, T3FREE, THYROIDAB in the last 72 hours. Anemia Panel: No results for input(s): VITAMINB12, FOLATE, FERRITIN, TIBC, IRON, RETICCTPCT in the last 72 hours. Sepsis Labs: No results for input(s): PROCALCITON, LATICACIDVEN in the last 168 hours.  No results found for this or any previous visit (from the past 240 hour(s)).    Radiology Studies: No results found.  Scheduled Meds: . amLODipine  5 mg Oral Daily  . Chlorhexidine Gluconate Cloth  6 each Topical Q0600  . [START ON 02/03/2020] darbepoetin (ARANESP) injection - NON-DIALYSIS  100 mcg Subcutaneous Q Thu-1800  . enoxaparin (LOVENOX) injection  30  mg Subcutaneous Q24H  . feeding supplement (ENSURE ENLIVE)  237 mL Oral BID BM  . insulin aspart  0-5 Units Subcutaneous QHS  . insulin aspart  0-6 Units Subcutaneous TID WC  . mouth rinse  15 mL Mouth Rinse BID  . polyethylene glycol  17 g Oral BID  . senna  2 tablet Oral BID    Continuous Infusions: . sodium chloride Stopped (01/13/20 0735)  .  ceFAZolin (ANCEF) IV    . magnesium sulfate bolus IVPB       LOS: 24 days   Time spent: 35 minutes   Darliss Cheney, MD [facility]  02/01/2020, 11:50 AM   To contact the attending provider between 7A-7P or the covering provider during after hours 7P-7A, please log into the web site [email or web address].

## 2020-02-01 NOTE — H&P (Signed)
Chief Complaint: Patient was seen in consultation today for a tunneled hemodialysis catheter at the request of Dr. Harrie Jeans.  Supervising Physician: Aletta Edouard  Patient Status: [facility] - In-pt  History of Present Illness: Cindy Burns is a 74 y.o. female with a past medical history significant for breast cancer, COPD, tobacco use, diabetes, MDD admitted January 08, 2020 with acute renal failure requiring CRRT, hyperkalemia, anion gap metabolic acidosis secondary to DKA, acute respiratory failure requiring intubation; extubated on April 6. Patient has been off CRRT for over a week but still has hyperkalemia and elevated renal function tests.   Interventional Radiology has been asked to see this patient for the placement of a tunneled hemodialysis catheter and will begin hemodialysis today or tomorrow.  Past Medical History:  Diagnosis Date  . AKI (acute kidney injury) ([place]) 01/2020  . [facility]) 2005   Breast Cancer  . COPD (chronic obstructive pulmonary disease) ([place])   . Depression   . Diabetes mellitus without complication ([place])   . Hyperlipidemia     Past Surgical History:  Procedure Laterality Date  . BREAST SURGERY  2005  . CATARACT EXTRACTION    . CHOLECYSTECTOMY  2013    Allergies: Patient has no known allergies.  Medications: Prior to Admission medications   Medication Sig Start Date End Date Taking? Authorizing Provider  sitaGLIPtin (JANUVIA) 100 MG tablet Take 100 mg by mouth daily.    [provider]     Family History  Problem Relation Age of Onset  . Arthritis Mother   . Alzheimer's disease Mother   . Dementia Father   . COPD Brother   . COPD Brother     Social History   Socioeconomic History  . Marital status: Divorced    Spouse name: Not on file  . Number of children: Not on file  . Years of education: Not on file  . Highest education level: Not on file  Occupational History  . Not on file  Tobacco Use  . Smoking status:  Current Every Day Smoker    Packs/day: 1.00    Years: 40.00    Pack years: 40.00    Types: Cigarettes  . Smokeless tobacco: Never Used  Substance and Sexual Activity  . Alcohol use: No  . Drug use: No  . Sexual activity: Not Currently  Other Topics Concern  . Not on file  Social History Narrative   Single, 1 daughter in [place]    on medicare/medicaid, disabled.   Social Determinants of Health   Financial Resource Strain:   . Difficulty of Paying Living Expenses:   Food Insecurity:   . Worried About Charity fundraiser in the Last Year:   . Arboriculturist in the Last Year:   Transportation Needs:   . Film/video editor (Medical):   [name] Lack of Transportation (Non-Medical):   Physical Activity:   . Days of Exercise per Week:   . Minutes of Exercise per Session:   Stress:   . Feeling of Stress :   Social Connections:   . Frequency of Communication with Friends and Family:   . Frequency of Social Gatherings with Friends and Family:   . Attends Religious Services:   . Active Member of Clubs or Organizations:   . Attends Archivist Meetings:   [name] Marital Status:      Review of Systems: A 12 point ROS discussed and pertinent positives are indicated in the HPI above.  All  other systems are negative.  Review of Systems  Respiratory: Negative for shortness of breath.   Cardiovascular: Negative for chest pain.  Gastrointestinal: Negative for abdominal pain.  Neurological: Negative for headaches.    Vital Signs: BP (!) 148/61 (BP Location: Right Arm)   Pulse 77   Temp 98.4 F (36.9 C) (Oral)   Resp 16   Ht 5\' 2"  (1.575 m)   Wt 120 lb 8 oz (54.7 kg)   SpO2 98%   BMI 22.04 kg/m   Physical Exam Constitutional:      General: She is not in acute distress.    Appearance: Normal appearance.  Cardiovascular:     Rate and Rhythm: Normal rate and regular rhythm.     Pulses: Normal pulses.     Heart sounds: Normal heart sounds.  Pulmonary:     Effort:  Pulmonary effort is normal.     Breath sounds: Normal breath sounds.  Abdominal:     General: Bowel sounds are normal.     Palpations: Abdomen is soft.  Skin:    General: Skin is warm and dry.  Neurological:     Mental Status: She is alert and oriented to person, place, and time.  Psychiatric:        Mood and Affect: Mood normal.        Behavior: Behavior normal.        Thought Content: Thought content normal.        Judgment: Judgment normal.     Imaging: DG Ankle Complete Left  Result Date: 01/16/2020 CLINICAL DATA:  Left ankle pain/swelling EXAM: LEFT ANKLE COMPLETE - 3+ VIEW COMPARISON:  None. FINDINGS: No fracture or dislocation is seen. The ankle mortise is intact. The base of the fifth metatarsal is unremarkable. The visualized soft tissues are unremarkable. IMPRESSION: Negative. Electronically Signed   By: Julian Hy M.D.   On: 01/16/2020 15:25   DG Abdomen 1 View  Result Date: 01/08/2020 CLINICAL DATA:  Enteric catheter placement. EXAM: ABDOMEN - 1 VIEW COMPARISON:  January 17, 2005 FINDINGS: The bowel gas pattern is nonobstructive. Enteric catheter overlies the gastric body. IMPRESSION: Enteric catheter overlies the gastric body. Electronically Signed   By: Fidela Salisbury M.D.   On: 01/08/2020 20:00   MR LUMBAR SPINE WO CONTRAST  Result Date: 01/21/2020 CLINICAL DATA:  Lumbar radiculopathy. EXAM: MRI LUMBAR SPINE WITHOUT CONTRAST TECHNIQUE: Multiplanar, multisequence MR imaging of the lumbar spine was performed. No intravenous contrast was administered. COMPARISON:  None. FINDINGS: Segmentation: 5 non rib-bearing lumbar type vertebral bodies are present. The lowest fully formed vertebral body is L5. Alignment: Limits anatomic. Some straightening of normal cervical lordosis is noted. Vertebrae: Edematous and fatty endplate marrow changes are present at L2-3. Mild degenerative changes are noted at L1-2. Marrow signal and vertebral body heights are otherwise normal. Conus  medullaris and cauda equina: Conus extends to the L2 level. Conus and cauda equina appear normal. Paraspinal and other soft tissues: Limited imaging the abdomen is unremarkable. There is no significant adenopathy. No solid organ lesions are present. Disc levels: L1-2: Negative. L2-3: Negative. L3-4: Mild disc bulging is present without significant stenosis. L4-5: A leftward disc protrusion is present. Annular tear is noted. No compressive stenosis is evident. L5-S1: A rightward disc protrusion extends into the foramen. Moderate facet hypertrophy is worse on the right. IMPRESSION: 1. Mild disc bulging at L3-4. 2. Leftward disc protrusion and annular tear. This may impact the exiting left L4 nerve root. 3. Rightward disc protrusion and annular  tear at L5-S1 without significant stenosis. 4. Moderate facet hypertrophy at L5-S1 is worse on the right. Electronically Signed   By: San Morelle M.D.   On: 01/21/2020 19:05   US RENAL  Result Date: 01/09/2020 CLINICAL DATA:  Acute kidney injury. EXAM: RENAL / URINARY TRACT ULTRASOUND COMPLETE COMPARISON:  None. FINDINGS: Right Kidney: Renal measurements: 9.5 x 3.9 x 4.7 cm = volume: 92 mL. Borderline increased parenchymal echogenicity. No mass or stone. No hydronephrosis. Left Kidney: Renal measurements: 1.4 x 4.9 x 6.7 cm = volume: 198 mL. Normal parenchymal echogenicity. Upper pole cyst, 1.7 x 2.1 x 1.6 cm. No other masses, no stones and no hydronephrosis. Bladder: Not visualized. Other: None. IMPRESSION: 1. No acute findings.  No hydronephrosis. 2. Relative small right kidney with borderline increased renal parenchymal echogenicity consistent medical renal disease. 3. Left renal cyst. Electronically Signed   By: Lajean Manes M.D.   On: 01/09/2020 07:59   DG CHEST PORT 1 VIEW  Result Date: 01/08/2020 CLINICAL DATA:  Check central line placement EXAM: PORTABLE CHEST 1 VIEW COMPARISON:  Film from earlier in the same day. FINDINGS: Endotracheal tube and gastric  catheter are noted in satisfactory position. Previously seen left jugular central line is been removed and a new temporary dialysis catheter is noted via the left jugular approach. Catheter tip is noted at the cavoatrial junction. No pneumothorax is seen. Persistent right basilar atelectasis and small effusion is seen. No acute bony abnormality is noted. IMPRESSION: No pneumothorax following central line exchange. The temporary dialysis catheter is in satisfactory position. Stable right basilar atelectasis and effusion. Electronically Signed   By: Inez Catalina M.D.   On: 01/08/2020 23:40   DG Chest Portable 1 View  Result Date: 01/08/2020 CLINICAL DATA:  Check endotracheal tube placement and central line placement EXAM: PORTABLE CHEST 1 VIEW COMPARISON:  Film from earlier in the same day. FINDINGS: Endotracheal tube is noted in satisfactory position 4 cm above the carina. Left jugular central line is noted in the mid superior vena cava. No pneumothorax is seen. Gastric catheter extends into the stomach. Cardiac shadow is within normal limits. Aortic calcifications are seen. Patchy atelectatic changes are noted in the bases new from the prior exam. Hyperinflation is again seen. IMPRESSION: Tubes and lines in satisfactory position. Mild bibasilar atelectasis. Electronically Signed   By: Inez Catalina M.D.   On: 01/08/2020 19:59   DG Chest Port 1 View  Result Date: 01/08/2020 CLINICAL DATA:  Increased shortness of breath and chills for 3-4 days. History of COPD. EXAM: PORTABLE CHEST 1 VIEW COMPARISON:  CT, 02/11/2014. FINDINGS: Cardiac silhouette is normal in size. Normal mediastinal and hilar contours. Lungs are hyperexpanded. Mild interstitial prominence in the lower lungs. No evidence of pneumonia or pulmonary edema. No pleural effusion or pneumothorax. Left axillary vascular clips consistent with prior breast surgery. Skeletal structures are grossly intact. IMPRESSION: 1. No acute cardiopulmonary disease. 2.  Lung hyperexpansion consistent with COPD. Electronically Signed   By: Lajean Manes M.D.   On: 01/08/2020 14:51    Labs:  CBC: Recent Labs    01/23/20 0534 01/25/20 0416 01/28/20 0429 01/30/20 0458  WBC 3.5* 4.5 4.6 5.0  HGB 7.5* 7.4* 7.2* 7.5*  HCT 23.5* 23.5* 23.4* 23.5*  PLT 220 256 256 272    COAGS: Recent Labs    01/08/20 2345 01/08/20 2345 01/09/20 0940 01/10/20 0737 01/12/20 0600 01/13/20 0507  INR 1.4*  --   --   --   --   --  APTT 31   < > 38* 37* 56* 42*   < > = values in this interval not displayed.    BMP: Recent Labs    01/30/20 0458 01/30/20 1540 01/31/20 0613 02/01/20 0520  NA 136 138 137 137  K 5.9* 6.1* 4.5 4.7  CL 101 99 97* 100  CO2 23 29 27 24   GLUCOSE 106* 191* 105* 101*  BUN 92* 92* 85* 81*  CALCIUM 8.4* 8.5* 8.0* 7.9*  CREATININE 7.07* 6.90* 6.64* 6.58*  GFRNONAA 5* 5* 6* 6*  GFRAA 6* 6* 7* 7*    LIVER FUNCTION TESTS: Recent Labs    04/01/19 0833 04/01/19 0833 07/29/19 0948 07/29/19 0948 01/08/20 1412 01/08/20 1412 01/08/20 2210 01/09/20 0940 01/29/20 0341 01/30/20 0458 01/31/20 0613 02/01/20 0520  BILITOT 0.4  --  0.4  --  0.9  --  0.8  --   --   --   --   --   AST 13  --  14  --  17  --  18  --   --   --   --   --   ALT 10  --  10  --  26  --  21  --   --   --   --   --   ALKPHOS 36*  --  40  --  120  --  87  --   --   --   --   --   PROT 6.7  --  7.0  --  8.2*  --  5.3*  --   --   --   --   --   ALBUMIN 4.2   < > 4.2   < > 3.5   < > 2.1*   < > 2.2* 2.2* 2.3* 2.3*   < > = values in this interval not displayed.    TUMOR MARKERS: No results for input(s): AFPTM, CEA, CA199, CHROMGRNA in the last 8760 hours.  Assessment and Plan:  Cindy Burns is a 74 y.o. female with a past medical history significant for breast cancer, COPD, tobacco use, diabetes, MDD admitted January 08, 2020 with acute renal failure requiring CRRT, hyperkalemia, anion gap metabolic acidosis secondary to DKA, acute respiratory failure requiring  intubation; extubated on April 6. Patient has been off CRRT for over a week but still has hyperkalemia and elevated renal function tests.   Interventional Radiology has been asked to see this patient for the placement of a tunneled hemodialysis catheter and will begin hemodialysis today or tomorrow.  Risks and benefits discussed with the patient including, but not limited to bleeding, infection, vascular injury, pneumothorax which may require chest tube placement, air embolism or even death  All of the patient's questions were answered, patient is agreeable to proceed. Consent signed and in chart.  Thank you for this interesting consult.  I greatly enjoyed meeting KIRBY ARGUETA and look forward to participating in their care.  A copy of this report was sent to the requesting provider on this date.  Electronically Signed: Theresa Duty, NP 02/01/2020, 8:44 AM   I spent a total of 20 Minutesin face to face in clinical consultation, greater than 50% of which was counseling/coordinating care for tunneled hemodialysis catheter

## 2020-02-02 DIAGNOSIS — N179 Acute kidney failure, unspecified: Secondary | ICD-10-CM | POA: Diagnosis not present

## 2020-02-02 LAB — RENAL FUNCTION PANEL
Albumin: 2.4 g/dL — ABNORMAL LOW (ref 3.5–5.0)
Anion gap: 15 (ref 5–15)
BUN: 45 mg/dL — ABNORMAL HIGH (ref 8–23)
CO2: 19 mmol/L — ABNORMAL LOW (ref 22–32)
Calcium: 8 mg/dL — ABNORMAL LOW (ref 8.9–10.3)
Chloride: 103 mmol/L (ref 98–111)
Creatinine, Ser: 4.61 mg/dL — ABNORMAL HIGH (ref 0.44–1.00)
GFR calc Af Amer: 10 mL/min — ABNORMAL LOW (ref >60)
GFR calc non Af Amer: 9 mL/min — ABNORMAL LOW (ref >60)
Glucose, Bld: 120 mg/dL — ABNORMAL HIGH (ref 70–99)
Phosphorus: 4.1 mg/dL (ref 2.5–4.6)
Potassium: 4.1 mmol/L (ref 3.5–5.1)
Sodium: 137 mmol/L (ref 135–145)

## 2020-02-02 LAB — CBC WITH DIFFERENTIAL/PLATELET
Abs Immature Granulocytes: 0.27 10*3/uL — ABNORMAL HIGH (ref 0.00–0.07)
Basophils Absolute: 0 10*3/uL (ref 0.0–0.1)
Basophils Relative: 1 %
Eosinophils Absolute: 0.1 10*3/uL (ref 0.0–0.5)
Eosinophils Relative: 1 %
HCT: 24.6 % — ABNORMAL LOW (ref 36.0–46.0)
Hemoglobin: 7.7 g/dL — ABNORMAL LOW (ref 12.0–15.0)
Immature Granulocytes: 4 %
Lymphocytes Relative: 15 %
Lymphs Abs: 0.9 10*3/uL (ref 0.7–4.0)
MCH: 30 pg (ref 26.0–34.0)
MCHC: 31.3 g/dL (ref 30.0–36.0)
MCV: 95.7 fL (ref 80.0–100.0)
Monocytes Absolute: 0.4 10*3/uL (ref 0.1–1.0)
Monocytes Relative: 6 %
Neutro Abs: 4.6 10*3/uL (ref 1.7–7.7)
Neutrophils Relative %: 73 %
Platelets: 238 10*3/uL (ref 150–400)
RBC: 2.57 MIL/uL — ABNORMAL LOW (ref 3.87–5.11)
RDW: 18 % — ABNORMAL HIGH (ref 11.5–15.5)
WBC: 6.3 10*3/uL (ref 4.0–10.5)
nRBC: 0.3 % — ABNORMAL HIGH (ref 0.0–0.2)

## 2020-02-02 LAB — HEPATITIS B CORE ANTIBODY, TOTAL: Hep B Core Total Ab: NONREACTIVE

## 2020-02-02 LAB — GLUCOSE, CAPILLARY
Glucose-Capillary: 120 mg/dL — ABNORMAL HIGH (ref 70–99)
Glucose-Capillary: 131 mg/dL — ABNORMAL HIGH (ref 70–99)
Glucose-Capillary: 144 mg/dL — ABNORMAL HIGH (ref 70–99)
Glucose-Capillary: 237 mg/dL — ABNORMAL HIGH (ref 70–99)
Glucose-Capillary: 92 mg/dL (ref 70–99)

## 2020-02-02 MED ORDER — METOPROLOL TARTRATE 25 MG PO TABS
25.0000 mg | ORAL_TABLET | Freq: Two times a day (BID) | ORAL | Status: DC
  Administered 2020-02-02 – 2020-02-03 (×3): 25 mg via ORAL
  Filled 2020-02-02 (×3): qty 1

## 2020-02-02 MED ORDER — HEPARIN SODIUM (PORCINE) 1000 UNIT/ML IJ SOLN
INTRAMUSCULAR | Status: AC
  Administered 2020-02-02: 3800 [IU] via INTRAVENOUS_CENTRAL
  Filled 2020-02-02: qty 4

## 2020-02-02 MED ORDER — METOPROLOL TARTRATE 12.5 MG HALF TABLET
12.5000 mg | ORAL_TABLET | Freq: Two times a day (BID) | ORAL | Status: DC
  Administered 2020-02-02: 12.5 mg via ORAL
  Filled 2020-02-02: qty 1

## 2020-02-02 NOTE — Plan of Care (Addendum)
Spoke with patient.  She does consent to a renal biopsy.  NPO after midnight tonight.  Consulted IR for renal biopsy for AKI for 4/29  She is ambulatory.  Have discontinued lovenox for now.   Claudia Desanctis 02/02/2020 6:55 PM

## 2020-02-02 NOTE — Progress Notes (Signed)
Occupational Therapy Treatment Patient Details Name: Cindy Burns MRN: [id number] DOB: 1946-06-12 Today's Date: 02/02/2020    History of present illness Pt is a 74 year old woman admitted on 01/08/20 to [facility] and subsequently transferred to [facility] with respiratory failure requring intubation, DKA, UTI, hyperkalemia and renal failure requiring CRRT. Extubated 01/11/20. PMH: COPD, breast cancer, Dm, MDD.   OT comments  Pt received resting in bed, pleasant and agreeable to participate in therapy. Pt overall Supervision for sit to stand transfers without AD. Pt opted for no AD for short distance mobility to bathroom, but required min guard due to unsteadiness. Pt Modified Independent for toileting task, Supervision for hand hygiene standing at sink. Guided pt in hallway distance mobility with Rollator, supervision level and no LOB. Recommend pt to have Rollator, as well as BSC upon DC home. Collaborated with pt on tub/shower setup and options for tub bench vs BSC. Plan to assess tub transfer simulation during next session.    Follow Up Recommendations  Home health OT;Supervision - Intermittent    Equipment Recommendations  3 in 1 bedside commode;Other (comment)(4WW)    Recommendations for Other Services      Precautions / Restrictions Precautions Precautions: Fall Precaution Comments: L foot clearance difficulty; much better with AFO Required Braces or Orthoses: Other Brace Other Brace: patient did not want to use AFO today Restrictions Weight Bearing Restrictions: No       Mobility Bed Mobility Overal bed mobility: Independent Bed Mobility: Supine to Sit           General bed mobility comments: no physical assist needed  Transfers Overall transfer level: Needs assistance Equipment used: None(Rollator) Transfers: Sit to/from [facility] Sit to Stand: Supervision Stand pivot transfers: Supervision       General transfer comment: Supervision for sit to stands  without AD, Supervision for stand pivot with Rollator     Balance                                           ADL either performed or assessed with clinical judgement   ADL Overall ADL's : Needs assistance/impaired     Grooming: Supervision/safety;Wash/dry hands;Standing Grooming Details (indicate cue type and reason): While standing at the sink                 Toilet Transfer: Min guard;Ambulation;Regular Glass blower/designer Details (indicate cue type and reason): min guard for ambulation to toilet without AD Toileting- Clothing Manipulation and Hygiene: Modified independent;Sitting/lateral lean;Sit to/from stand Toileting - Clothing Manipulation Details (indicate cue type and reason): Modified Independent with use of grab bars      Functional mobility during ADLs: Supervision/safety(Rollator) General ADL Comments: Pt with improved balance using Rollator. Unsteadiness noted without AD in short distance mobility to bathroom. Limited mildly by endurance     Vision       Perception     Praxis      Cognition Arousal/Alertness: Awake/alert Behavior During Therapy: WFL for tasks assessed/performed Overall Cognitive Status: Within Functional Limits for tasks assessed                                          Exercises     Shoulder Instructions       General Comments  Pertinent Vitals/ Pain       Pain Assessment: No/denies pain  Home Living                                          Prior Functioning/Environment              Frequency  Min 2X/week        Progress Toward Goals  OT Goals(current goals can now be found in the care plan section)  Progress towards OT goals: Progressing toward goals  Acute Rehab OT Goals Patient Stated Goal: to get betterand go home to her puppies OT Goal Formulation: With patient Time For Goal Achievement: 02/14/20 Potential to Achieve Goals: Good ADL  Goals Pt Will Perform Eating: with supervision;with set-up;sitting Pt Will Perform Grooming: with set-up;with supervision;sitting Pt Will Perform Upper Body Bathing: with supervision;with set-up;sitting Pt Will Perform Lower Body Bathing: with modified independence;sit to/from stand Pt Will Perform Upper Body Dressing: with supervision;with set-up;sitting Pt Will Perform Lower Body Dressing: with modified independence;sit to/from stand Pt Will Transfer to Toilet: with modified independence;ambulating Pt Will Perform Tub/Shower Transfer: Tub transfer;with modified independence;3 in 1;ambulating Pt/caregiver will Perform Home Exercise Program: Increased strength;Both right and left upper extremity;With Supervision Additional ADL Goal #1: Pt will independently verbalize 3 strategies to reduce risk of falls Additional ADL Goal #2: Pt will conistently follow 1 step commandsin 3/3 trials in non-distracting enviornment  Plan Discharge plan remains appropriate    Co-evaluation                 AM-PAC OT "6 Clicks" Daily Activity     Outcome Measure   Help from another person eating meals?: None Help from another person taking care of personal grooming?: A Little Help from another person toileting, which includes using toliet, bedpan, or urinal?: A Little Help from another person bathing (including washing, rinsing, drying)?: A Little Help from another person to put on and taking off regular upper body clothing?: A Little Help from another person to put on and taking off regular lower body clothing?: A Little 6 Click Score: 19    End of Session Equipment Utilized During Treatment: Gait belt;Rolling walker  OT Visit Diagnosis: Muscle weakness (generalized) (M62.81);Other symptoms and signs involving cognitive function;Unsteadiness on feet (R26.81)   Activity Tolerance Patient tolerated treatment well   Patient Left in bed;with call bell/phone within reach   Nurse Communication  Mobility status        Time: 1550-1611 OT Time Calculation (min): 21 min  Charges: OT General Charges $OT Visit: 1 Visit OT Treatments $Self Care/Home Management : 8-22 mins  Layla Maw, OTR/L   Layla Maw 02/02/2020, 4:20 PM

## 2020-02-02 NOTE — Progress Notes (Signed)
Progress Note    ANNELLA Burns  RXV:[id number] DOB: 1946/10/04  DOA: 01/08/2020 PCP: [name], MD    Brief Narrative:     Medical records reviewed and are as summarized below:  Cindy Burns is an 74 y.o. female with past medical history significant for breast cancer, COPD, tobacco use, diabetes, MDD admitted April 3 with acute renal failure requiring CRRT, hyperkalemia, anion gap metabolic acidosis secondary to DKA, acute respiratory failure requiring intubation and extubated on April 6.  She has had persistent ongoing kidney failure.  Followed by nephrology and dialysis started April 27.  Today she underwent her second session for hemodialysis and nephrology recommending kidney biopsy  Assessment/Plan:   Principal Problem:   Renal failure Active Problems:   Hypocalcemia   Metabolic acidosis   Acute respiratory failure with hypoxia (HCC)   C[employer]ARETTE SMO[employer]ER   Hypertension   UTI (urinary tract infection)   Pressure injury of skin   Encounter for central line placement   Pain and swelling of ankle, left   #1.  Acute kidney injury secondary to severe volume depletion in the setting of DKA/UTI/metabolic acidosis she was on CRRT through January 11, 2020.  Has been off of RRT for over a week but had intermittent hyperkalemia.  Nephrology following and hemodialysis started April 27.  Pending renal biopsy. -Dialysis needs to be assessed daily per nephrology -N.p.o. after midnight in case she agrees to biopsy -Appreciate nephrology assistance  #2.  Acute respiratory failure with hypoxia in the setting of COPD and former tobacco use.  Patient intubated on admission.  Extubated 2 days later.  Oxygen saturation levels remained greater than 90% on room air.  Of note her last cigarette was 2 days prior to admission.  Discussed/encouraged patient to continue smoking cessation once discharged -Incentive spirometry -As needed nebulizers -Mobilize  #3.  Hyperkalemia.   Resolved. -Monitor  #4.  Urinary tract infection.  Pansensitive E. coli noted on culture.  Blood cultures with no growth to date.  Patient remained afebrile hemodynamically stable and nontoxic-appearing.  Of note she completed a 7-day course of Rocephin   #5.  Normocytic anemia.  Stable.  No sign symptoms of active bleeding  #6.  Diabetes type 2.  Uncontrolled.  Patient with a history of noncompliance as well.  Hemoglobin A1c is 8.5.  Her blood sugar remains labile. -Sliding scale insulin for optimal control  #7.  Left foot drop/pain and swelling of the left ankle.  X-ray of the left ankle was negative.  MRI of the lumbar spine reveals disc bulging at L3 and L4 leftward disc protrusion and annular tear, rightward disc protrusion and annular tear of L5-S1, moderate facet hypertrophy at L5-S1.  Patient evaluated by neurosurgery who opined no immediate intervention needed.  Recommended outpatient follow-up with possible EMG.  She was provided with AFO/dorsiflexion brace per PT  #8.  Pressure injury coccyx stage II as well as left heel deep tissue pressure injury.  Partial-thickness loss of dermis. -Appreciate W OC recommendations   Family Communication/Anticipated D/C date and plan/Code Status   DVT prophylaxis: Heparin ordered. Code Status: Full Code.  Family Communication: patient with questions answered Disposition Plan: Status is: Inpatient  Remains inpatient appropriate because:Inpatient level of care appropriate due to severity of illness   Dispo: The patient is from: Home              Anticipated d/c is to: Home  Anticipated d/c date is: 2 days              Patient currently is not medically stable to d/c.          Medical Consultants:    None.   Anti-Infectives:    None  Subjective:   Patient is awake alert looking somewhat perturbed.  Upon inquiry she stated "Dr. now recommending biopsy.  I have been here for 28 days and do not understand why it  took this long to figure out I needed that".  I reviewed patient's clinical course with her by way of explanation and reasoning behind decisions.  She seemed less than satisfied.  Not sure if she will agree to biopsy.  Objective:    Vitals:   02/02/20 0900 02/02/20 0919 02/02/20 1018 02/02/20 1100  BP: (!) 159/70 (!) 165/68 (!) 177/71 (!) 149/61  Pulse: 77 76 92 84  Resp:  14 15 16   Temp:  98.1 F (36.7 C) 98.9 F (37.2 C) 98.1 F (36.7 C)  TempSrc:  Oral Oral Oral  SpO2:  97% 96% 98%  Weight:  54.1 kg    Height:        Intake/Output Summary (Last 24 hours) at 02/02/2020 1308 Last data filed at 02/02/2020 1250 Gross per 24 hour  Intake 507 ml  Output 1450 ml  Net -943 ml   Filed Weights   02/02/20 0040 02/02/20 0645 02/02/20 0919  Weight: 55.3 kg 53.2 kg 54.1 kg    Exam: General: Awake alert oriented x3 no acute distress CV: Regular rate and rhythm no murmur gallop or rub no lower extremity edema Respiratory: No increased work of breathing breath sounds with good air movement.  I hear no wheeze no rhonchi Abdomen: Soft nondistended positive bowel sounds throughout no guarding or rebounding Musculoskeletal: Joints without swelling/erythema brace to left foot intact Neuro: Alert and oriented x3 speech clear facial symmetry  Data Reviewed:   I have personally reviewed following labs and imaging studies:  Labs: Labs show the following:   Basic Metabolic Panel: Recent Labs  Lab 01/29/20 0341 01/29/20 1518 01/30/20 0458 01/30/20 0458 01/30/20 1540 01/30/20 1540 01/31/20 0613 01/31/20 0613 02/01/20 0520 02/02/20 0432  NA 135   < > 136  --  138  --  137  --  137 137  K 6.0*   < > 5.9*   < > 6.1*   < > 4.5   < > 4.7 4.1  CL 100   < > 101  --  99  --  97*  --  100 103  CO2 24   < > 23  --  29  --  27  --  24 19*  GLUCOSE 110*   < > 106*  --  191*  --  105*  --  101* 120*  BUN 92*   < > 92*  --  92*  --  85*  --  81* 45*  CREATININE 6.71*   < > 7.07*  --  6.90*   --  6.64*  --  6.58* 4.61*  CALCIUM 8.3*   < > 8.4*  --  8.5*  --  8.0*  --  7.9* 8.0*  MG  --   --   --   --   --   --   --   --  1.6*  --   PHOS 3.6  --  4.3  --   --   --  5.6*  --  5.4* 4.1   < > = values in this interval not displayed.   GFR Estimated Creatinine Clearance: 8.9 mL/min (A) (by C-G formula based on SCr of 4.61 mg/dL (H)). Liver Function Tests: Recent Labs  Lab 01/29/20 0341 01/30/20 0458 01/31/20 0613 02/01/20 0520 02/02/20 0432  ALBUMIN 2.2* 2.2* 2.3* 2.3* 2.4*   No results for input(s): LIPASE, AMYLASE in the last 168 hours. No results for input(s): AMMONIA in the last 168 hours. Coagulation profile Recent Labs  Lab 02/01/20 0938  INR 1.1    CBC: Recent Labs  Lab 01/28/20 0429 01/30/20 0458 02/02/20 0432  WBC 4.6 5.0 6.3  NEUTROABS  --   --  4.6  HGB 7.2* 7.5* 7.7*  HCT 23.4* 23.5* 24.6*  MCV 94.7 95.5 95.7  PLT 256 272 238   Cardiac Enzymes: No results for input(s): CKTOTAL, CKMB, CKMBINDEX, TROPONINI in the last 168 hours. BNP (last 3 results) No results for input(s): PROBNP in the last 8760 hours. CBG: Recent Labs  Lab 02/01/20 1836 02/01/20 2047 02/02/20 0045 02/02/20 0636 02/02/20 1108  GLUCAP 110* 211* 144* 92 120*   D-Dimer: No results for input(s): DDIMER in the last 72 hours. Hgb A1c: No results for input(s): HGBA1C in the last 72 hours. Lipid Profile: No results for input(s): CHOL, HDL, LDLCALC, TRIG, CHOLHDL, LDLDIRECT in the last 72 hours. Thyroid function studies: No results for input(s): TSH, T4TOTAL, T3FREE, THYROIDAB in the last 72 hours.  Invalid input(s): FREET3 Anemia work up: No results for input(s): VITAMINB12, FOLATE, FERRITIN, TIBC, IRON, RETICCTPCT in the last 72 hours. Sepsis Labs: Recent Labs  Lab 01/28/20 0429 01/30/20 0458 02/02/20 0432  WBC 4.6 5.0 6.3    Microbiology No results found for this or any previous visit (from the past 240 hour(s)).  Procedures and diagnostic studies:  IR  Fluoro Guide CV Line Right  Result Date: 02/01/2020 CLINICAL DATA:  End-stage renal disease and need for tunneled hemodialysis catheter for dialysis. EXAM: TUNNELED CENTRAL VENOUS HEMODIALYSIS CATHETER PLACEMENT WITH ULTRASOUND AND FLUOROSCOPIC GUIDANCE ANESTHESIA/SEDATION: 2.0 mg IV Versed; 100 mcg IV Fentanyl. Total Moderate Sedation Time:   23 minutes. The patient's level of consciousness and physiologic status were continuously monitored during the procedure by Radiology nursing. MEDICATIONS: 2 g IV Ancef. FLUOROSCOPY TIME:  42 seconds.  1.0 mGy. PROCEDURE: The procedure, risks, benefits, and alternatives were explained to the patient. Questions regarding the procedure were encouraged and answered. The patient understands and consents to the procedure. A timeout was performed prior to initiating the procedure. The right neck and chest were prepped with chlorhexidine in a sterile fashion, and a sterile drape was applied covering the operative field. Maximum barrier sterile technique with sterile gowns and gloves were used for the procedure. Local anesthesia was provided with 1% lidocaine. Ultrasound was used to confirm patency of the right internal jugular vein. After creating a small venotomy incision, a 21 gauge needle was advanced into the right internal jugular vein under direct, real-time ultrasound guidance. Ultrasound image documentation was performed. After securing guidewire access, an 8 Fr dilator was placed. A J-wire was kinked to measure appropriate catheter length. A Palindrome tunneled hemodialysis catheter measuring 23 cm from tip to cuff was chosen for placement. This was tunneled in a retrograde fashion from the chest wall to the venotomy incision. At the venotomy, serial dilatation was performed and a 16 Fr peel-away sheath was placed over a guidewire. The catheter was then placed through the sheath and the sheath removed. Final catheter positioning  was confirmed and documented with a  fluoroscopic spot image. The catheter was aspirated, flushed with saline, and injected with appropriate volume heparin dwells. The venotomy incision was closed with subcuticular 4-0 Vicryl. Dermabond was applied to the incision. The catheter exit site was secured with 0-Prolene retention sutures. COMPLICATIONS: None.  No pneumothorax. FINDINGS: After catheter placement, the tip lies in the right atrium. The catheter aspirates normally and is ready for immediate use. IMPRESSION: Placement of tunneled hemodialysis catheter via the right internal jugular vein. The catheter tip lies in the right atrium. The catheter is ready for immediate use. Electronically Signed   By: Aletta Edouard M.D.   On: 02/01/2020 18:35   IR US Guide Vasc Access Right  Result Date: 02/01/2020 CLINICAL DATA:  End-stage renal disease and need for tunneled hemodialysis catheter for dialysis. EXAM: TUNNELED CENTRAL VENOUS HEMODIALYSIS CATHETER PLACEMENT WITH ULTRASOUND AND FLUOROSCOPIC GUIDANCE ANESTHESIA/SEDATION: 2.0 mg IV Versed; 100 mcg IV Fentanyl. Total Moderate Sedation Time:   23 minutes. The patient's level of consciousness and physiologic status were continuously monitored during the procedure by Radiology nursing. MEDICATIONS: 2 g IV Ancef. FLUOROSCOPY TIME:  42 seconds.  1.0 mGy. PROCEDURE: The procedure, risks, benefits, and alternatives were explained to the patient. Questions regarding the procedure were encouraged and answered. The patient understands and consents to the procedure. A timeout was performed prior to initiating the procedure. The right neck and chest were prepped with chlorhexidine in a sterile fashion, and a sterile drape was applied covering the operative field. Maximum barrier sterile technique with sterile gowns and gloves were used for the procedure. Local anesthesia was provided with 1% lidocaine. Ultrasound was used to confirm patency of the right internal jugular vein. After creating a small venotomy  incision, a 21 gauge needle was advanced into the right internal jugular vein under direct, real-time ultrasound guidance. Ultrasound image documentation was performed. After securing guidewire access, an 8 Fr dilator was placed. A J-wire was kinked to measure appropriate catheter length. A Palindrome tunneled hemodialysis catheter measuring 23 cm from tip to cuff was chosen for placement. This was tunneled in a retrograde fashion from the chest wall to the venotomy incision. At the venotomy, serial dilatation was performed and a 16 Fr peel-away sheath was placed over a guidewire. The catheter was then placed through the sheath and the sheath removed. Final catheter positioning was confirmed and documented with a fluoroscopic spot image. The catheter was aspirated, flushed with saline, and injected with appropriate volume heparin dwells. The venotomy incision was closed with subcuticular 4-0 Vicryl. Dermabond was applied to the incision. The catheter exit site was secured with 0-Prolene retention sutures. COMPLICATIONS: None.  No pneumothorax. FINDINGS: After catheter placement, the tip lies in the right atrium. The catheter aspirates normally and is ready for immediate use. IMPRESSION: Placement of tunneled hemodialysis catheter via the right internal jugular vein. The catheter tip lies in the right atrium. The catheter is ready for immediate use. Electronically Signed   By: Aletta Edouard M.D.   On: 02/01/2020 18:35    Medications:   . amLODipine  5 mg Oral Daily  . Chlorhexidine Gluconate Cloth  6 each Topical Q0600  . Chlorhexidine Gluconate Cloth  6 each Topical Q0600  . [START ON 02/03/2020] darbepoetin (ARANESP) injection - NON-DIALYSIS  100 mcg Subcutaneous Q Thu-1800  . enoxaparin (LOVENOX) injection  30 mg Subcutaneous Q24H  . feeding supplement (NEPRO CARB STEADY)  237 mL Oral BID BM  . insulin aspart  0-5 Units  Subcutaneous QHS  . insulin aspart  0-6 Units Subcutaneous TID WC  . mouth rinse   15 mL Mouth Rinse BID  . metoprolol tartrate  12.5 mg Oral BID  . polyethylene glycol  17 g Oral BID  . senna  2 tablet Oral BID   Continuous Infusions: . sodium chloride Stopped (01/13/20 0735)     LOS: 25 days   Radene Gunning NP [facility]   How to contact the [facility] Attending or Consulting provider [place] or covering provider during after hours [place], for this patient?  1. Check the care team in [facility] and look for a) attending/consulting [facility] provider listed and b) the [facility] team listed 2. Log into [email or web address] and use [place]'s universal password to access. If you do not have the password, please contact the hospital operator. 3. Locate the [facility] provider you are looking for under [facility] and page to a number that you can be directly reached. 4. If you still have difficulty reaching the provider, please page the [facility] (Director on Call) for the Hospitalists listed on amion for assistance.  02/02/2020, 1:08 PM

## 2020-02-02 NOTE — Progress Notes (Signed)
OT Cancellation Note  Patient Details Name: Cindy Burns MRN: [id number] DOB: May 31, 1946   Cancelled Treatment:    Reason Eval/Treat Not Completed: Patient at procedure or test/ unavailable(Pt at HD, will re-attempt session as time allows)  Layla Maw 02/02/2020, 9:52 AM

## 2020-02-02 NOTE — Progress Notes (Signed)
Nephrology Progress Note:    Subjective:  Seen and examined on dialysis at 8:10 am.  Blood pressure 157/66 and HR 70.  Tolerating goal.  Had tunneled dialysis catheter placed on 4/27 with IR.  Then had HD on 4/27.  Had 2 liters UOP over 4/27.  I have recommended a kidney biopsy and she would like to think about this.   Review of systems:    Denies n/v Denies shortness of breath or chest pain Urinating well   04/27 0701 - 04/28 0700 In: 337 [P.O.:237; IV Piggyback:100] Out: 2000 [Urine:2000]  Filed Weights   02/01/20 2217 02/02/20 0040 02/02/20 0645  Weight: 55.3 kg 55.3 kg 53.2 kg    Scheduled Meds: . amLODipine  5 mg Oral Daily  . Chlorhexidine Gluconate Cloth  6 each Topical Q0600  . Chlorhexidine Gluconate Cloth  6 each Topical Q0600  . [START ON 02/03/2020] darbepoetin (ARANESP) injection - NON-DIALYSIS  100 mcg Subcutaneous Q Thu-1800  . enoxaparin (LOVENOX) injection  30 mg Subcutaneous Q24H  . feeding supplement (NEPRO CARB STEADY)  237 mL Oral BID BM  . insulin aspart  0-5 Units Subcutaneous QHS  . insulin aspart  0-6 Units Subcutaneous TID WC  . mouth rinse  15 mL Mouth Rinse BID  . polyethylene glycol  17 g Oral BID  . senna  2 tablet Oral BID   Continuous Infusions: . sodium chloride Stopped (01/13/20 0735)  . sodium chloride    . sodium chloride     PRN Meds:.sodium chloride, sodium chloride, sodium chloride, alteplase, dextrose, heparin, ipratropium-albuterol, labetalol, lidocaine (PF), lidocaine-prilocaine, pentafluoroprop-tetrafluoroeth   Physical Exam:   General adult female in bed in no acute distress HEENT normocephalic atraumatic extraocular movements intact sclera anicteric Neck supple trachea midline Lungs clear to auscultation bilaterally normal work of breathing at rest  Heart S1S2 no rub Abdomen soft nontender nondistended Extremities no edema  Psych normal mood and affect Neuro - alert and oriented and conversant follows commands Access: RIJ  tunneled dialysis catheter   Assessment/Plan:  1. AKI, nl baseline; previously felt likely ATN from hypovolemia/hemodynamic insults.  on CRRT through 4/6.  No iHD noted.  Following the RRT with persistently impaired clearance. Started iHD on 4/27 after tunneled catheter with IR.  Up/cr ratio 2110 mg/g, 0-5 RBC.  Presented with Cr 15.4 at [facility] from OSH - Assess dialysis needs daily.  Tentative treatment on 4/30  - I have recommended a renal biopsy and patient would like to think about this - NPO after midnight for possible procedure on 4/29 - If does remain on HD renal will need to CLIP as AKI  - Check ANA, ANCA     2. Hyperkalemia: resolved with kayexalate x 2 on 4/25.  renal diet.  Stopped her multivitamin.  3. Anemia normocytic    1. Fe replete; setting of AKI   2. ESA Aranesp every Thurs - increased dose to 100 mcg starting with 4/29 dose  4. HTN avoid hypotension; discontinued bicarb.  Starting HD.  Missed her amlodipine yesterday.  Start metoprolol tartrate 12.5 mg BID - optimizing for possible biopsy.   5. Metabolic Acidosis - resolved; discontinue bicarb 6. DKA resolved 7. DM2 per primary team  8. COPD per primary team  9. Hypomag - repeat mag in AM   Recent Labs  Lab 01/31/20 0613 02/01/20 0520 02/02/20 0432  NA 137 137 137  K 4.5 4.7 4.1  CL 97* 100 103  CO2 27 24 19*  GLUCOSE 105* 101* 120*  BUN 85* 81* 45*  CREATININE 6.64* 6.58* 4.61*  CALCIUM 8.0* 7.9* 8.0*  PHOS 5.6* 5.4* 4.1   Recent Labs  Lab 01/28/20 0429 01/30/20 0458 02/02/20 0432  WBC 4.6 5.0 6.3  NEUTROABS  --   --  4.6  HGB 7.2* 7.5* 7.7*  HCT 23.4* 23.5* 24.6*  MCV 94.7 95.5 95.7  PLT 256 272 238     Claudia Desanctis, MD   02/02/2020 8:33 AM

## 2020-02-03 DIAGNOSIS — N179 Acute kidney failure, unspecified: Secondary | ICD-10-CM | POA: Diagnosis not present

## 2020-02-03 LAB — CBC
HCT: 23.8 % — ABNORMAL LOW (ref 36.0–46.0)
Hemoglobin: 7.4 g/dL — ABNORMAL LOW (ref 12.0–15.0)
MCH: 30.1 pg (ref 26.0–34.0)
MCHC: 31.1 g/dL (ref 30.0–36.0)
MCV: 96.7 fL (ref 80.0–100.0)
Platelets: 210 10*3/uL (ref 150–400)
RBC: 2.46 MIL/uL — ABNORMAL LOW (ref 3.87–5.11)
RDW: 18.1 % — ABNORMAL HIGH (ref 11.5–15.5)
WBC: 4.9 10*3/uL (ref 4.0–10.5)
nRBC: 0 % (ref 0.0–0.2)

## 2020-02-03 LAB — RENAL FUNCTION PANEL
Albumin: 2.2 g/dL — ABNORMAL LOW (ref 3.5–5.0)
Anion gap: 11 (ref 5–15)
BUN: 31 mg/dL — ABNORMAL HIGH (ref 8–23)
CO2: 24 mmol/L (ref 22–32)
Calcium: 7.6 mg/dL — ABNORMAL LOW (ref 8.9–10.3)
Chloride: 101 mmol/L (ref 98–111)
Creatinine, Ser: 3.65 mg/dL — ABNORMAL HIGH (ref 0.44–1.00)
GFR calc Af Amer: 14 mL/min — ABNORMAL LOW (ref >60)
GFR calc non Af Amer: 12 mL/min — ABNORMAL LOW (ref >60)
Glucose, Bld: 119 mg/dL — ABNORMAL HIGH (ref 70–99)
Phosphorus: 3.9 mg/dL (ref 2.5–4.6)
Potassium: 3.6 mmol/L (ref 3.5–5.1)
Sodium: 136 mmol/L (ref 135–145)

## 2020-02-03 LAB — GLUCOSE, CAPILLARY
Glucose-Capillary: 106 mg/dL — ABNORMAL HIGH (ref 70–99)
Glucose-Capillary: 172 mg/dL — ABNORMAL HIGH (ref 70–99)
Glucose-Capillary: 192 mg/dL — ABNORMAL HIGH (ref 70–99)
Glucose-Capillary: 109 mg/dL — ABNORMAL HIGH (ref 70–99)

## 2020-02-03 LAB — MAGNESIUM: Magnesium: 1.5 mg/dL — ABNORMAL LOW (ref 1.7–2.4)

## 2020-02-03 MED ORDER — CHLORHEXIDINE GLUCONATE CLOTH 2 % EX PADS
6.0000 | MEDICATED_PAD | Freq: Every day | CUTANEOUS | Status: DC
Start: 2020-02-04 — End: 2020-02-03

## 2020-02-03 MED ORDER — MAGNESIUM SULFATE 2 GM/50ML IV SOLN
2.0000 g | Freq: Once | INTRAVENOUS | Status: AC
  Administered 2020-02-03: 2 g via INTRAVENOUS
  Filled 2020-02-03: qty 50

## 2020-02-03 NOTE — Progress Notes (Signed)
Nephrology Progress Note:    Subjective:  Had 450 mL UOP over 4/28.  Has been NPO for a renal biopsy requested for today.  She does still wish to proceed with that procedure.  Review of systems:  Denies n/v Denies shortness of breath or chest pain Urinating well   04/28 0701 - 04/29 0700 In: 470 [P.O.:470] Out: 700 [Urine:450]  Filed Weights   02/02/20 0040 02/02/20 0645 02/02/20 0919  Weight: 55.3 kg 53.2 kg 54.1 kg    Scheduled Meds: . amLODipine  5 mg Oral Daily  . Chlorhexidine Gluconate Cloth  6 each Topical Q0600  . Chlorhexidine Gluconate Cloth  6 each Topical Q0600  . darbepoetin (ARANESP) injection - NON-DIALYSIS  100 mcg Subcutaneous Q Thu-1800  . feeding supplement (NEPRO CARB STEADY)  237 mL Oral BID BM  . insulin aspart  0-5 Units Subcutaneous QHS  . insulin aspart  0-6 Units Subcutaneous TID WC  . mouth rinse  15 mL Mouth Rinse BID  . metoprolol tartrate  25 mg Oral BID  . polyethylene glycol  17 g Oral BID  . senna  2 tablet Oral BID   Continuous Infusions: . sodium chloride Stopped (01/13/20 0735)   PRN Meds:.sodium chloride, dextrose, ipratropium-albuterol, labetalol   Physical Exam:   General adult female in bed in no acute distress HEENT normocephalic atraumatic extraocular movements intact sclera anicteric Neck supple trachea midline Lungs clear to auscultation bilaterally normal work of breathing at rest  Heart S1S2 no rub Abdomen soft nontender nondistended Extremities no edema  Psych normal mood and affect Neuro - alert and oriented and conversant follows commands Access: RIJ tunneled dialysis catheter   Assessment/Plan:  1. AKI, nl baseline; previously felt likely ATN from hypovolemia/hemodynamic insults.  on CRRT through 4/6.  No iHD noted.  Following the RRT with persistently impaired clearance. Started iHD on 4/27 after tunneled catheter with IR.  Up/cr ratio 2110 mg/g, 0-5 RBC.  Presented with Cr 15.4 at [facility] from OSH.  Hx reported  DKA - Assess dialysis needs daily.  Tentative treatment on 4/30  - Renal biopsy requested for today - she is NPO for same.  Biopsy form for [facility] on the front of chart - If does remain on HD renal will need to CLIP as AKI  - ANA, ANCA pending. send SPEP, serum free light chains     2. Hyperkalemia: resolved.  Noted last got kayexalate x 2 on 4/25.  renal diet.  Stopped her multivitamin.  3. Anemia normocytic    1. Fe replete; setting of AKI   2. ESA Aranesp every Thurs - increased dose to 100 mcg starting with 4/29 dose  4. HTN  - started metoprolol - now at 25 mg BID  5. Metabolic Acidosis - managed with HD 6. DKA resolved 7. DM2 per primary team  8. COPD per primary team  9. Hypomag - replete    Recent Labs  Lab 02/01/20 0520 02/02/20 0432 02/03/20 0515  NA 137 137 136  K 4.7 4.1 3.6  CL 100 103 101  CO2 24 19* 24  GLUCOSE 101* 120* 119*  BUN 81* 45* 31*  CREATININE 6.58* 4.61* 3.65*  CALCIUM 7.9* 8.0* 7.6*  PHOS 5.4* 4.1 3.9   Recent Labs  Lab 01/28/20 0429 01/30/20 0458 02/02/20 0432  WBC 4.6 5.0 6.3  NEUTROABS  --   --  4.6  HGB 7.2* 7.5* 7.7*  HCT 23.4* 23.5* 24.6*  MCV 94.7 95.5 95.7  PLT 256 272 238  Claudia Desanctis, MD   02/03/2020 7:33 AM

## 2020-02-03 NOTE — Progress Notes (Signed)
Progress Note    Cindy Burns  AGT:[id number] DOB: [birth year]  DOA: 01/08/2020 PCP: [name], MD    Brief Narrative:     Medical records reviewed and are as summarized below:  Cindy Burns is an 74 y.o. female  with past medical history significant for breast cancer, COPD, tobacco use, diabetes, MDD admitted April 3 with acute renal failure requiring CRRT, hyperkalemia, anion gap metabolic acidosis secondary to DKA, acute respiratory failure requiring intubation and extubated on April 6.  She has had persistent ongoing kidney failure.  Followed by nephrology and dialysis started April 27.  Today she underwent her second session for hemodialysis and nephrology recommending kidney biopsy. Renal biopsy 4/29 and dialysis 4/30  Assessment/Plan:   Principal Problem:   Renal failure Active Problems:   Hypocalcemia   Metabolic acidosis   Acute respiratory failure with hypoxia ([place])   [employer]   Hypertension   UTI (urinary tract infection)   Pressure injury of skin   Encounter for central line placement   Pain and swelling of ankle, left   #1.  Acute kidney injury secondary to severe volume depletion in the setting of DKA/UTI/metabolic acidosis she was on CRRT through January 11, 2020.  Has been off of RRT for over a week but had intermittent hyperkalemia.  Nephrology following and hemodialysis started April 27.  Renal biopsy 4/29 -follow biopsy results. -Dialysis needs to be assessed daily per nephrology and tentative plan for 4/30 -Appreciate nephrology assistance  #2.  Acute respiratory failure with hypoxia in the setting of COPD and former tobacco use.  Patient intubated on admission.  Extubated 2 days later.  Oxygen saturation levels remained greater than 90% on room air.  Of note her last cigarette was 2 days prior to admission.  Discussed/encouraged patient to continue smoking cessation once discharged -Incentive spirometry -As needed  nebulizers -Mobilize  #3.  Hyperkalemia.  Resolved. -Monitor  #4.  Urinary tract infection.  Pansensitive E. coli noted on culture.  Blood cultures with no growth to date.  Patient remained afebrile hemodynamically stable and nontoxic-appearing.  Of note she completed a 7-day course of Rocephin   #5.  Normocytic anemia.  Stable in 7 range.  No sign symptoms of active bleeding -ESA aranesp every Thursday per nephrology -monitor  #6.  Diabetes type 2.  Uncontrolled.  Patient with a history of noncompliance as well.  Hemoglobin A1c is 8.5.  Her blood sugar remains labile. -Sliding scale insulin for optimal control  #7.  Left foot drop/pain and swelling of the left ankle.  X-ray of the left ankle was negative.  MRI of the lumbar spine reveals disc bulging at L3 and L4 leftward disc protrusion and annular tear, rightward disc protrusion and annular tear of L5-S1, moderate facet hypertrophy at L5-S1.  Patient evaluated by neurosurgery who opined no immediate intervention needed.  Recommended outpatient follow-up with possible EMG.  She was provided with AFO/dorsiflexion brace per PT  #8.  Pressure injury coccyx stage II as well as left heel deep tissue pressure injury.  Partial-thickness loss of dermis. -Appreciate W OC recommendations    Family Communication/Anticipated D/C date and plan/Code Status   DVT prophylaxis: heparin ordered. Code Status: Full Code.  Family Communication: patient with questions answered Disposition Plan: Status is: Inpatient  Remains inpatient appropriate because:Inpatient level of care appropriate due to severity of illness   Dispo: The patient is from: Home  Anticipated d/c is to: Home              Anticipated d/c date is: 1 day              Patient currently is not medically stable to d/c.          Medical Consultants:    None.   Anti-Infectives:    None  Subjective:   Awake alert. Smiling. Denies pain/discomfort.  Reports having slept well last night  Objective:    Vitals:   02/02/20 1800 02/02/20 2036 02/03/20 0523 02/03/20 0816  BP: (!) 156/58 (!) 151/59 (!) 148/62 (!) 159/67  Pulse: 79 77 76 71  Resp: 16 16 18    Temp: 98.9 F (37.2 C) 98.8 F (37.1 C) 98.7 F (37.1 C) 98.7 F (37.1 C)  TempSrc: Oral Oral Oral Oral  SpO2: 96% 95% 97% 95%  Weight:      Height:        Intake/Output Summary (Last 24 hours) at 02/03/2020 0851 Last data filed at 02/03/2020 0747 Gross per 24 hour  Intake 470 ml  Output 700 ml  Net -230 ml   Filed Weights   02/02/20 0040 02/02/20 0645 02/02/20 0919  Weight: 55.3 kg 53.2 kg 54.1 kg    Exam: General: Awake alert oriented x3 no acute distress CV: Regular rate and rhythm no murmur gallop or rub no lower extremity edema Respiratory: No increased work of breathing breath sounds with good air movement.  I hear no wheeze no rhonchi Abdomen: Soft nondistended positive bowel sounds throughout no guarding or rebounding Musculoskeletal: Joints without swelling/erythema brace to left foot intact Neuro: Alert and oriented x3 speech clear facial symmetry  Data Reviewed:   I have personally reviewed following labs and imaging studies:  Labs: Labs show the following:   Basic Metabolic Panel: Recent Labs  Lab 01/30/20 0458 01/30/20 0458 01/30/20 1540 01/30/20 1540 01/31/20 0613 01/31/20 0613 02/01/20 0520 02/01/20 0520 02/02/20 0432 02/03/20 0515  NA 136   < > 138  --  137  --  137  --  137 136  K 5.9*   < > 6.1*   < > 4.5   < > 4.7   < > 4.1 3.6  CL 101   < > 99  --  97*  --  100  --  103 101  CO2 23   < > 29  --  27  --  24  --  19* 24  GLUCOSE 106*   < > 191*  --  105*  --  101*  --  120* 119*  BUN 92*   < > 92*  --  85*  --  81*  --  45* 31*  CREATININE 7.07*   < > 6.90*  --  6.64*  --  6.58*  --  4.61* 3.65*  CALCIUM 8.4*   < > 8.5*  --  8.0*  --  7.9*  --  8.0* 7.6*  MG  --   --   --   --   --   --  1.6*  --   --  1.5*  PHOS 4.3  --   --   --   5.6*  --  5.4*  --  4.1 3.9   < > = values in this interval not displayed.   GFR Estimated Creatinine Clearance: 11.2 mL/min (A) (by C-G formula based on SCr of 3.65 mg/dL (H)). Liver Function Tests: Recent Labs  Lab 01/30/20 [id number]  01/31/20 2025 02/01/20 0520 02/02/20 0432 02/03/20 0515  ALBUMIN 2.2* 2.3* 2.3* 2.4* 2.2*   No results for input(s): LIPASE, AMYLASE in the last 168 hours. No results for input(s): AMMONIA in the last 168 hours. Coagulation profile Recent Labs  Lab 02/01/20 0938  INR 1.1    CBC: Recent Labs  Lab 01/28/20 0429 01/30/20 0458 02/02/20 0432  WBC 4.6 5.0 6.3  NEUTROABS  --   --  4.6  HGB 7.2* 7.5* 7.7*  HCT 23.4* 23.5* 24.6*  MCV 94.7 95.5 95.7  PLT 256 272 238   Cardiac Enzymes: No results for input(s): CKTOTAL, CKMB, CKMBINDEX, TROPONINI in the last 168 hours. BNP (last 3 results) No results for input(s): PROBNP in the last 8760 hours. CBG: Recent Labs  Lab 02/02/20 0636 02/02/20 1108 02/02/20 1622 02/02/20 2037 02/03/20 0633  GLUCAP 92 120* 237* 131* 106*   D-Dimer: No results for input(s): DDIMER in the last 72 hours. Hgb A1c: No results for input(s): HGBA1C in the last 72 hours. Lipid Profile: No results for input(s): CHOL, HDL, LDLCALC, TRIG, CHOLHDL, LDLDIRECT in the last 72 hours. Thyroid function studies: No results for input(s): TSH, T4TOTAL, T3FREE, THYROIDAB in the last 72 hours.  Invalid input(s): FREET3 Anemia work up: No results for input(s): VITAMINB12, FOLATE, FERRITIN, TIBC, IRON, RETICCTPCT in the last 72 hours. Sepsis Labs: Recent Labs  Lab 01/28/20 0429 01/30/20 0458 02/02/20 0432  WBC 4.6 5.0 6.3    Microbiology No results found for this or any previous visit (from the past 240 hour(s)).  Procedures and diagnostic studies:  IR Fluoro Guide CV Line Right  Result Date: 02/01/2020 CLINICAL DATA:  End-stage renal disease and need for tunneled hemodialysis catheter for dialysis. EXAM: TUNNELED  CENTRAL VENOUS HEMODIALYSIS CATHETER PLACEMENT WITH ULTRASOUND AND FLUOROSCOPIC GUIDANCE ANESTHESIA/SEDATION: 2.0 mg IV Versed; 100 mcg IV Fentanyl. Total Moderate Sedation Time:   23 minutes. The patient's level of consciousness and physiologic status were continuously monitored during the procedure by Radiology nursing. MEDICATIONS: 2 g IV Ancef. FLUOROSCOPY TIME:  42 seconds.  1.0 mGy. PROCEDURE: The procedure, risks, benefits, and alternatives were explained to the patient. Questions regarding the procedure were encouraged and answered. The patient understands and consents to the procedure. A timeout was performed prior to initiating the procedure. The right neck and chest were prepped with chlorhexidine in a sterile fashion, and a sterile drape was applied covering the operative field. Maximum barrier sterile technique with sterile gowns and gloves were used for the procedure. Local anesthesia was provided with 1% lidocaine. Ultrasound was used to confirm patency of the right internal jugular vein. After creating a small venotomy incision, a 21 gauge needle was advanced into the right internal jugular vein under direct, real-time ultrasound guidance. Ultrasound image documentation was performed. After securing guidewire access, an 8 Fr dilator was placed. A J-wire was kinked to measure appropriate catheter length. A Palindrome tunneled hemodialysis catheter measuring 23 cm from tip to cuff was chosen for placement. This was tunneled in a retrograde fashion from the chest wall to the venotomy incision. At the venotomy, serial dilatation was performed and a 16 Fr peel-away sheath was placed over a guidewire. The catheter was then placed through the sheath and the sheath removed. Final catheter positioning was confirmed and documented with a fluoroscopic spot image. The catheter was aspirated, flushed with saline, and injected with appropriate volume heparin dwells. The venotomy incision was closed with  subcuticular 4-0 Vicryl. Dermabond was applied to the incision. The catheter  exit site was secured with 0-Prolene retention sutures. COMPLICATIONS: None.  No pneumothorax. FINDINGS: After catheter placement, the tip lies in the right atrium. The catheter aspirates normally and is ready for immediate use. IMPRESSION: Placement of tunneled hemodialysis catheter via the right internal jugular vein. The catheter tip lies in the right atrium. The catheter is ready for immediate use. Electronically Signed   By: Aletta Edouard M.D.   On: 02/01/2020 18:35   IR US Guide Vasc Access Right  Result Date: 02/01/2020 CLINICAL DATA:  End-stage renal disease and need for tunneled hemodialysis catheter for dialysis. EXAM: TUNNELED CENTRAL VENOUS HEMODIALYSIS CATHETER PLACEMENT WITH ULTRASOUND AND FLUOROSCOPIC GUIDANCE ANESTHESIA/SEDATION: 2.0 mg IV Versed; 100 mcg IV Fentanyl. Total Moderate Sedation Time:   23 minutes. The patient's level of consciousness and physiologic status were continuously monitored during the procedure by Radiology nursing. MEDICATIONS: 2 g IV Ancef. FLUOROSCOPY TIME:  42 seconds.  1.0 mGy. PROCEDURE: The procedure, risks, benefits, and alternatives were explained to the patient. Questions regarding the procedure were encouraged and answered. The patient understands and consents to the procedure. A timeout was performed prior to initiating the procedure. The right neck and chest were prepped with chlorhexidine in a sterile fashion, and a sterile drape was applied covering the operative field. Maximum barrier sterile technique with sterile gowns and gloves were used for the procedure. Local anesthesia was provided with 1% lidocaine. Ultrasound was used to confirm patency of the right internal jugular vein. After creating a small venotomy incision, a 21 gauge needle was advanced into the right internal jugular vein under direct, real-time ultrasound guidance. Ultrasound image documentation was performed.  After securing guidewire access, an 8 Fr dilator was placed. A J-wire was kinked to measure appropriate catheter length. A Palindrome tunneled hemodialysis catheter measuring 23 cm from tip to cuff was chosen for placement. This was tunneled in a retrograde fashion from the chest wall to the venotomy incision. At the venotomy, serial dilatation was performed and a 16 Fr peel-away sheath was placed over a guidewire. The catheter was then placed through the sheath and the sheath removed. Final catheter positioning was confirmed and documented with a fluoroscopic spot image. The catheter was aspirated, flushed with saline, and injected with appropriate volume heparin dwells. The venotomy incision was closed with subcuticular 4-0 Vicryl. Dermabond was applied to the incision. The catheter exit site was secured with 0-Prolene retention sutures. COMPLICATIONS: None.  No pneumothorax. FINDINGS: After catheter placement, the tip lies in the right atrium. The catheter aspirates normally and is ready for immediate use. IMPRESSION: Placement of tunneled hemodialysis catheter via the right internal jugular vein. The catheter tip lies in the right atrium. The catheter is ready for immediate use. Electronically Signed   By: Aletta Edouard M.D.   On: 02/01/2020 18:35    Medications:    amLODipine  5 mg Oral Daily   Chlorhexidine Gluconate Cloth  6 each Topical Q0600   Chlorhexidine Gluconate Cloth  6 each Topical Q0600   darbepoetin (ARANESP) injection - NON-DIALYSIS  100 mcg Subcutaneous Q Thu-1800   feeding supplement (NEPRO CARB STEADY)  237 mL Oral BID BM   insulin aspart  0-5 Units Subcutaneous QHS   insulin aspart  0-6 Units Subcutaneous TID WC   mouth rinse  15 mL Mouth Rinse BID   metoprolol tartrate  25 mg Oral BID   polyethylene glycol  17 g Oral BID   senna  2 tablet Oral BID   Continuous Infusions:  sodium  chloride Stopped (01/13/20 0735)   magnesium sulfate bolus IVPB       LOS: 26  days   [facility] M NP [facility]   How to contact the [facility] Attending or Consulting provider [place] or covering provider during after hours [place], for this patient?  1. Check the care team in [facility] and look for a) attending/consulting [facility] provider listed and b) the [facility] team listed 2. Log into [email or web address] and use [place]'s universal password to access. If you do not have the password, please contact the hospital operator. 3. Locate the [facility] provider you are looking for under [facility] and page to a number that you can be directly reached. 4. If you still have difficulty reaching the provider, please page the [facility] (Director on Call) for the Hospitalists listed on amion for assistance.  02/03/2020, 8:51 AM

## 2020-02-03 NOTE — Consult Note (Signed)
Chief Complaint: Patient was seen in consultation today for a random renal biopsy.  Referring Physician(s): Harrie Jeans  Supervising Physician: Jacqulynn Cadet  Patient Status: [facility] - In-pt  History of Present Illness: Cindy Burns is a 74 y.o. female with a past medical history significant for depression, HLD, DM, COPD and breast cancer who presented to [facility] ED on 01/08/20 with complaints of dyspnea - she was found to be in acute renal failure with acidemia and hypothermia. She was transferred to [facility] for emergent HD and she was on CRRT until 4/6. She was transitioned off CRRT but continued to have intermittent hyperkalemia and was started on HD on 4/27. IR has been asked to perform a random renal biopsy to further evaluate her persistent renal failure.  Ms. Cindy Burns is laying in bed, denies any complaints currently. She states she was told her biopsy would be today so she is a little disappointed that we cannot accommodate her biopsy today due to IR scheduling however she is understanding and agrees to proceed tomorrow.    Past Medical History:  Diagnosis Date  . AKI (acute kidney injury) ([place]) 01/2020  . Cancer [facility]) 2005   Breast Cancer  . COPD (chronic obstructive pulmonary disease) ([place])   . Depression   . Diabetes mellitus without complication ([place])   . Hyperlipidemia     Past Surgical History:  Procedure Laterality Date  . BREAST SURGERY  2005  . CATARACT EXTRACTION    . CHOLECYSTECTOMY  2013  . IR FLUORO GUIDE CV LINE RIGHT  02/01/2020  . IR US GUIDE VASC ACCESS RIGHT  02/01/2020    Allergies: Patient has no known allergies.  Medications: Prior to Admission medications   Medication Sig Start Date End Date Taking? Authorizing Provider  sitaGLIPtin (JANUVIA) 100 MG tablet Take 100 mg by mouth daily.    [provider]     Family History  Problem Relation Age of Onset  . Arthritis Mother   . Alzheimer's disease Mother   . Dementia Father   . COPD  Brother   . COPD Brother     Social History   Socioeconomic History  . Marital status: Divorced    Spouse name: Not on file  . Number of children: Not on file  . Years of education: Not on file  . Highest education level: Not on file  Occupational History  . Not on file  Tobacco Use  . Smoking status: Current Every Day Smoker    Packs/day: 1.00    Years: 40.00    Pack years: 40.00    Types: Cigarettes  . Smokeless tobacco: Never Used  Substance and Sexual Activity  . Alcohol use: No  . Drug use: No  . Sexual activity: Not Currently  Other Topics Concern  . Not on file  Social History Narrative   Single, 1 daughter in [place]    on medicare/medicaid, disabled.   Social Determinants of Health   Financial Resource Strain:   . Difficulty of Paying Living Expenses:   Food Insecurity:   . Worried About Charity fundraiser in the Last Year:   . Arboriculturist in the Last Year:   Transportation Needs:   . Film/video editor (Medical):   [name] Lack of Transportation (Non-Medical):   Physical Activity:   . Days of Exercise per Week:   . Minutes of Exercise per Session:   Stress:   . Feeling of Stress :   Social Connections:   .  Frequency of Communication with Friends and Family:   . Frequency of Social Gatherings with Friends and Family:   . Attends Religious Services:   . Active Member of Clubs or Organizations:   . Attends Archivist Meetings:   [name] Marital Status:      Review of Systems: A 12 point ROS discussed and pertinent positives are indicated in the HPI above.  All other systems are negative.  Review of Systems  Constitutional: Negative for chills and fever.  Respiratory: Negative for cough and shortness of breath.   Cardiovascular: Negative for chest pain.  Gastrointestinal: Negative for abdominal pain, nausea and vomiting.    Vital Signs: BP (!) 148/68 (BP Location: Right Wrist)   Pulse 72   Temp 98.2 F (36.8 C) (Oral)   Resp 18    Ht 5\' 2"  (1.575 m)   Wt 119 lb 4.3 oz (54.1 kg)   SpO2 96%   BMI 21.81 kg/m   Physical Exam Vitals and nursing note reviewed.  Constitutional:      General: She is not in acute distress. HENT:     Head: Normocephalic.     Mouth/Throat:     Mouth: Mucous membranes are moist.     Pharynx: Oropharynx is clear. No oropharyngeal exudate or posterior oropharyngeal erythema.  Cardiovascular:     Rate and Rhythm: Normal rate and regular rhythm.     Comments: (+) tunneled right IJ catheter Pulmonary:     Effort: Pulmonary effort is normal.     Breath sounds: Normal breath sounds.  Abdominal:     General: There is no distension.     Palpations: Abdomen is soft.     Tenderness: There is no abdominal tenderness.  Skin:    General: Skin is warm and dry.  Neurological:     Mental Status: She is alert and oriented to person, place, and time.  Psychiatric:        Mood and Affect: Mood normal.        Behavior: Behavior normal.        Thought Content: Thought content normal.        Judgment: Judgment normal.      MD Evaluation Airway: WNL Heart: WNL Abdomen: WNL Chest/ Lungs: WNL ASA  Classification: 3 Mallampati/Airway Score: Two   Imaging: DG Ankle Complete Left  Result Date: 01/16/2020 CLINICAL DATA:  Left ankle pain/swelling EXAM: LEFT ANKLE COMPLETE - 3+ VIEW COMPARISON:  None. FINDINGS: No fracture or dislocation is seen. The ankle mortise is intact. The base of the fifth metatarsal is unremarkable. The visualized soft tissues are unremarkable. IMPRESSION: Negative. Electronically Signed   By: Julian Hy M.D.   On: 01/16/2020 15:25   DG Abdomen 1 View  Result Date: 01/08/2020 CLINICAL DATA:  Enteric catheter placement. EXAM: ABDOMEN - 1 VIEW COMPARISON:  January 17, 2005 FINDINGS: The bowel gas pattern is nonobstructive. Enteric catheter overlies the gastric body. IMPRESSION: Enteric catheter overlies the gastric body. Electronically Signed   By: Fidela Salisbury M.D.    On: 01/08/2020 20:00   MR LUMBAR SPINE WO CONTRAST  Result Date: 01/21/2020 CLINICAL DATA:  Lumbar radiculopathy. EXAM: MRI LUMBAR SPINE WITHOUT CONTRAST TECHNIQUE: Multiplanar, multisequence MR imaging of the lumbar spine was performed. No intravenous contrast was administered. COMPARISON:  None. FINDINGS: Segmentation: 5 non rib-bearing lumbar type vertebral bodies are present. The lowest fully formed vertebral body is L5. Alignment: Limits anatomic. Some straightening of normal cervical lordosis is noted. Vertebrae: Edematous and fatty endplate marrow  changes are present at L2-3. Mild degenerative changes are noted at L1-2. Marrow signal and vertebral body heights are otherwise normal. Conus medullaris and cauda equina: Conus extends to the L2 level. Conus and cauda equina appear normal. Paraspinal and other soft tissues: Limited imaging the abdomen is unremarkable. There is no significant adenopathy. No solid organ lesions are present. Disc levels: L1-2: Negative. L2-3: Negative. L3-4: Mild disc bulging is present without significant stenosis. L4-5: A leftward disc protrusion is present. Annular tear is noted. No compressive stenosis is evident. L5-S1: A rightward disc protrusion extends into the foramen. Moderate facet hypertrophy is worse on the right. IMPRESSION: 1. Mild disc bulging at L3-4. 2. Leftward disc protrusion and annular tear. This may impact the exiting left L4 nerve root. 3. Rightward disc protrusion and annular tear at L5-S1 without significant stenosis. 4. Moderate facet hypertrophy at L5-S1 is worse on the right. Electronically Signed   By: San Morelle M.D.   On: 01/21/2020 19:05   US RENAL  Result Date: 01/09/2020 CLINICAL DATA:  Acute kidney injury. EXAM: RENAL / URINARY TRACT ULTRASOUND COMPLETE COMPARISON:  None. FINDINGS: Right Kidney: Renal measurements: 9.5 x 3.9 x 4.7 cm = volume: 92 mL. Borderline increased parenchymal echogenicity. No mass or stone. No  hydronephrosis. Left Kidney: Renal measurements: 1.4 x 4.9 x 6.7 cm = volume: 198 mL. Normal parenchymal echogenicity. Upper pole cyst, 1.7 x 2.1 x 1.6 cm. No other masses, no stones and no hydronephrosis. Bladder: Not visualized. Other: None. IMPRESSION: 1. No acute findings.  No hydronephrosis. 2. Relative small right kidney with borderline increased renal parenchymal echogenicity consistent medical renal disease. 3. Left renal cyst. Electronically Signed   By: Lajean Manes M.D.   On: 01/09/2020 07:59   IR Fluoro Guide CV Line Right  Result Date: 02/01/2020 CLINICAL DATA:  End-stage renal disease and need for tunneled hemodialysis catheter for dialysis. EXAM: TUNNELED CENTRAL VENOUS HEMODIALYSIS CATHETER PLACEMENT WITH ULTRASOUND AND FLUOROSCOPIC GUIDANCE ANESTHESIA/SEDATION: 2.0 mg IV Versed; 100 mcg IV Fentanyl. Total Moderate Sedation Time:   23 minutes. The patient's level of consciousness and physiologic status were continuously monitored during the procedure by Radiology nursing. MEDICATIONS: 2 g IV Ancef. FLUOROSCOPY TIME:  42 seconds.  1.0 mGy. PROCEDURE: The procedure, risks, benefits, and alternatives were explained to the patient. Questions regarding the procedure were encouraged and answered. The patient understands and consents to the procedure. A timeout was performed prior to initiating the procedure. The right neck and chest were prepped with chlorhexidine in a sterile fashion, and a sterile drape was applied covering the operative field. Maximum barrier sterile technique with sterile gowns and gloves were used for the procedure. Local anesthesia was provided with 1% lidocaine. Ultrasound was used to confirm patency of the right internal jugular vein. After creating a small venotomy incision, a 21 gauge needle was advanced into the right internal jugular vein under direct, real-time ultrasound guidance. Ultrasound image documentation was performed. After securing guidewire access, an 8 Fr  dilator was placed. A J-wire was kinked to measure appropriate catheter length. A Palindrome tunneled hemodialysis catheter measuring 23 cm from tip to cuff was chosen for placement. This was tunneled in a retrograde fashion from the chest wall to the venotomy incision. At the venotomy, serial dilatation was performed and a 16 Fr peel-away sheath was placed over a guidewire. The catheter was then placed through the sheath and the sheath removed. Final catheter positioning was confirmed and documented with a fluoroscopic spot image. The catheter was  aspirated, flushed with saline, and injected with appropriate volume heparin dwells. The venotomy incision was closed with subcuticular 4-0 Vicryl. Dermabond was applied to the incision. The catheter exit site was secured with 0-Prolene retention sutures. COMPLICATIONS: None.  No pneumothorax. FINDINGS: After catheter placement, the tip lies in the right atrium. The catheter aspirates normally and is ready for immediate use. IMPRESSION: Placement of tunneled hemodialysis catheter via the right internal jugular vein. The catheter tip lies in the right atrium. The catheter is ready for immediate use. Electronically Signed   By: Aletta Edouard M.D.   On: 02/01/2020 18:35   IR US Guide Vasc Access Right  Result Date: 02/01/2020 CLINICAL DATA:  End-stage renal disease and need for tunneled hemodialysis catheter for dialysis. EXAM: TUNNELED CENTRAL VENOUS HEMODIALYSIS CATHETER PLACEMENT WITH ULTRASOUND AND FLUOROSCOPIC GUIDANCE ANESTHESIA/SEDATION: 2.0 mg IV Versed; 100 mcg IV Fentanyl. Total Moderate Sedation Time:   23 minutes. The patient's level of consciousness and physiologic status were continuously monitored during the procedure by Radiology nursing. MEDICATIONS: 2 g IV Ancef. FLUOROSCOPY TIME:  42 seconds.  1.0 mGy. PROCEDURE: The procedure, risks, benefits, and alternatives were explained to the patient. Questions regarding the procedure were encouraged and  answered. The patient understands and consents to the procedure. A timeout was performed prior to initiating the procedure. The right neck and chest were prepped with chlorhexidine in a sterile fashion, and a sterile drape was applied covering the operative field. Maximum barrier sterile technique with sterile gowns and gloves were used for the procedure. Local anesthesia was provided with 1% lidocaine. Ultrasound was used to confirm patency of the right internal jugular vein. After creating a small venotomy incision, a 21 gauge needle was advanced into the right internal jugular vein under direct, real-time ultrasound guidance. Ultrasound image documentation was performed. After securing guidewire access, an 8 Fr dilator was placed. A J-wire was kinked to measure appropriate catheter length. A Palindrome tunneled hemodialysis catheter measuring 23 cm from tip to cuff was chosen for placement. This was tunneled in a retrograde fashion from the chest wall to the venotomy incision. At the venotomy, serial dilatation was performed and a 16 Fr peel-away sheath was placed over a guidewire. The catheter was then placed through the sheath and the sheath removed. Final catheter positioning was confirmed and documented with a fluoroscopic spot image. The catheter was aspirated, flushed with saline, and injected with appropriate volume heparin dwells. The venotomy incision was closed with subcuticular 4-0 Vicryl. Dermabond was applied to the incision. The catheter exit site was secured with 0-Prolene retention sutures. COMPLICATIONS: None.  No pneumothorax. FINDINGS: After catheter placement, the tip lies in the right atrium. The catheter aspirates normally and is ready for immediate use. IMPRESSION: Placement of tunneled hemodialysis catheter via the right internal jugular vein. The catheter tip lies in the right atrium. The catheter is ready for immediate use. Electronically Signed   By: Aletta Edouard M.D.   On:  02/01/2020 18:35   DG CHEST PORT 1 VIEW  Result Date: 01/08/2020 CLINICAL DATA:  Check central line placement EXAM: PORTABLE CHEST 1 VIEW COMPARISON:  Film from earlier in the same day. FINDINGS: Endotracheal tube and gastric catheter are noted in satisfactory position. Previously seen left jugular central line is been removed and a new temporary dialysis catheter is noted via the left jugular approach. Catheter tip is noted at the cavoatrial junction. No pneumothorax is seen. Persistent right basilar atelectasis and small effusion is seen. No acute bony abnormality is  noted. IMPRESSION: No pneumothorax following central line exchange. The temporary dialysis catheter is in satisfactory position. Stable right basilar atelectasis and effusion. Electronically Signed   By: Inez Catalina M.D.   On: 01/08/2020 23:40   DG Chest Portable 1 View  Result Date: 01/08/2020 CLINICAL DATA:  Check endotracheal tube placement and central line placement EXAM: PORTABLE CHEST 1 VIEW COMPARISON:  Film from earlier in the same day. FINDINGS: Endotracheal tube is noted in satisfactory position 4 cm above the carina. Left jugular central line is noted in the mid superior vena cava. No pneumothorax is seen. Gastric catheter extends into the stomach. Cardiac shadow is within normal limits. Aortic calcifications are seen. Patchy atelectatic changes are noted in the bases new from the prior exam. Hyperinflation is again seen. IMPRESSION: Tubes and lines in satisfactory position. Mild bibasilar atelectasis. Electronically Signed   By: Inez Catalina M.D.   On: 01/08/2020 19:59   DG Chest Port 1 View  Result Date: 01/08/2020 CLINICAL DATA:  Increased shortness of breath and chills for 3-4 days. History of COPD. EXAM: PORTABLE CHEST 1 VIEW COMPARISON:  CT, 02/11/2014. FINDINGS: Cardiac silhouette is normal in size. Normal mediastinal and hilar contours. Lungs are hyperexpanded. Mild interstitial prominence in the lower lungs. No evidence  of pneumonia or pulmonary edema. No pleural effusion or pneumothorax. Left axillary vascular clips consistent with prior breast surgery. Skeletal structures are grossly intact. IMPRESSION: 1. No acute cardiopulmonary disease. 2. Lung hyperexpansion consistent with COPD. Electronically Signed   By: Lajean Manes M.D.   On: 01/08/2020 14:51    Labs:  CBC: Recent Labs    01/25/20 0416 01/28/20 0429 01/30/20 0458 02/02/20 0432  WBC 4.5 4.6 5.0 6.3  HGB 7.4* 7.2* 7.5* 7.7*  HCT 23.5* 23.4* 23.5* 24.6*  PLT 256 256 272 238    COAGS: Recent Labs    01/08/20 2345 01/08/20 2345 01/09/20 0940 01/10/20 0737 01/12/20 0600 01/13/20 0507 02/01/20 0938  INR 1.4*  --   --   --   --   --  1.1  APTT 31   < > 38* 37* 56* 42*  --    < > = values in this interval not displayed.    BMP: Recent Labs    01/31/20 0613 02/01/20 0520 02/02/20 0432 02/03/20 0515  NA 137 137 137 136  K 4.5 4.7 4.1 3.6  CL 97* 100 103 101  CO2 27 24 19* 24  GLUCOSE 105* 101* 120* 119*  BUN 85* 81* 45* 31*  CALCIUM 8.0* 7.9* 8.0* 7.6*  CREATININE 6.64* 6.58* 4.61* 3.65*  GFRNONAA 6* 6* 9* 12*  GFRAA 7* 7* 10* 14*    LIVER FUNCTION TESTS: Recent Labs    04/01/19 0833 04/01/19 0833 07/29/19 0948 07/29/19 0948 01/08/20 1412 01/08/20 1412 01/08/20 2210 01/09/20 0940 01/31/20 2706 02/01/20 0520 02/02/20 0432 02/03/20 0515  BILITOT 0.4  --  0.4  --  0.9  --  0.8  --   --   --   --   --   AST 13  --  14  --  17  --  18  --   --   --   --   --   ALT 10  --  10  --  26  --  21  --   --   --   --   --   ALKPHOS 36*  --  40  --  120  --  87  --   --   --   --   --  PROT 6.7  --  7.0  --  8.2*  --  5.3*  --   --   --   --   --   ALBUMIN 4.2   < > 4.2   < > 3.5   < > 2.1*   < > 2.3* 2.3* 2.4* 2.2*   < > = values in this interval not displayed.    TUMOR MARKERS: No results for input(s): AFPTM, CEA, CA199, CHROMGRNA in the last 8760 hours.  Assessment and Plan:  74 y/o F admitted 01/08/20 for  several comorbid issues, one of which being AKI (cr >15 on admission) that was initially felt likely due to hypovolemia however after a short course of CRRT she continued to have persistent hyperkalemia and has required HD. IR has been asked to perform a random renal biopsy for further evaluation of acute renal failure.   Will tentatively plan for US guided biopsy tomorrow (4/30) pending any emergent procedures - patient to be NPO at midnight, hold Lovenox until post procedure, AM labs pending, IR will call for patient when ready.  Risks and benefits of random renal biopsy was discussed with the patient and/or patient's family including, but not limited to bleeding, infection, damage to adjacent structures or low yield requiring additional tests.  All of the questions were answered and there is agreement to proceed.  Consent signed and in chart.  Thank you for this interesting consult.  I greatly enjoyed meeting DASHANA GUIZAR and look forward to participating in their care.  A copy of this report was sent to the requesting provider on this date.  Electronically Signed: Joaquim Nam, PA-C 02/03/2020, 9:37 AM   I spent a total of 20 Minutes in face to face in clinical consultation, greater than 50% of which was counseling/coordinating care for random renal biopsy.

## 2020-02-04 ENCOUNTER — Inpatient Hospital Stay (HOSPITAL_COMMUNITY): Payer: Medicare Other

## 2020-02-04 LAB — GLUCOSE, CAPILLARY
Glucose-Capillary: 115 mg/dL — ABNORMAL HIGH (ref 70–99)
Glucose-Capillary: 119 mg/dL — ABNORMAL HIGH (ref 70–99)
Glucose-Capillary: 121 mg/dL — ABNORMAL HIGH (ref 70–99)
Glucose-Capillary: 91 mg/dL (ref 70–99)
Glucose-Capillary: 95 mg/dL (ref 70–99)

## 2020-02-04 LAB — KAPPA/LAMBDA LIGHT CHAINS
Kappa free light chain: 127.9 mg/L — ABNORMAL HIGH (ref 3.3–19.4)
Kappa, lambda light chain ratio: 1.18 (ref 0.26–1.65)
Lambda free light chains: 108.8 mg/L — ABNORMAL HIGH (ref 5.7–26.3)

## 2020-02-04 LAB — PROTEIN ELECTROPHORESIS, SERUM
A/G Ratio: 1 (ref 0.7–1.7)
Albumin ELP: 2.7 g/dL — ABNORMAL LOW (ref 2.9–4.4)
Alpha-1-Globulin: 0.2 g/dL (ref 0.0–0.4)
Alpha-2-Globulin: 0.7 g/dL (ref 0.4–1.0)
Beta Globulin: 0.9 g/dL (ref 0.7–1.3)
Gamma Globulin: 0.9 g/dL (ref 0.4–1.8)
Globulin, Total: 2.7 g/dL (ref 2.2–3.9)
Total Protein ELP: 5.4 g/dL — ABNORMAL LOW (ref 6.0–8.5)

## 2020-02-04 LAB — CBC
HCT: 24.4 % — ABNORMAL LOW (ref 36.0–46.0)
Hemoglobin: 7.5 g/dL — ABNORMAL LOW (ref 12.0–15.0)
MCH: 30 pg (ref 26.0–34.0)
MCHC: 30.7 g/dL (ref 30.0–36.0)
MCV: 97.6 fL (ref 80.0–100.0)
Platelets: 203 10*3/uL (ref 150–400)
RBC: 2.5 MIL/uL — ABNORMAL LOW (ref 3.87–5.11)
RDW: 18.5 % — ABNORMAL HIGH (ref 11.5–15.5)
WBC: 6.6 10*3/uL (ref 4.0–10.5)
nRBC: 0 % (ref 0.0–0.2)

## 2020-02-04 LAB — RENAL FUNCTION PANEL
Albumin: 2.3 g/dL — ABNORMAL LOW (ref 3.5–5.0)
Anion gap: 9 (ref 5–15)
BUN: 43 mg/dL — ABNORMAL HIGH (ref 8–23)
CO2: 25 mmol/L (ref 22–32)
Calcium: 8.1 mg/dL — ABNORMAL LOW (ref 8.9–10.3)
Chloride: 100 mmol/L (ref 98–111)
Creatinine, Ser: 4.87 mg/dL — ABNORMAL HIGH (ref 0.44–1.00)
GFR calc Af Amer: 10 mL/min — ABNORMAL LOW (ref >60)
GFR calc non Af Amer: 8 mL/min — ABNORMAL LOW (ref >60)
Glucose, Bld: 121 mg/dL — ABNORMAL HIGH (ref 70–99)
Phosphorus: 4.9 mg/dL — ABNORMAL HIGH (ref 2.5–4.6)
Potassium: 3.7 mmol/L (ref 3.5–5.1)
Sodium: 134 mmol/L — ABNORMAL LOW (ref 135–145)

## 2020-02-04 LAB — URINE CULTURE

## 2020-02-04 LAB — PROTIME-INR
INR: 1 (ref 0.8–1.2)
Prothrombin Time: 13.1 seconds (ref 11.4–15.2)

## 2020-02-04 MED ORDER — FENTANYL CITRATE (PF) 100 MCG/2ML IJ SOLN
INTRAMUSCULAR | Status: AC
  Filled 2020-02-04: qty 2

## 2020-02-04 MED ORDER — FENTANYL CITRATE (PF) 100 MCG/2ML IJ SOLN
INTRAMUSCULAR | Status: AC | PRN
  Administered 2020-02-04: 50 ug via INTRAVENOUS

## 2020-02-04 MED ORDER — METOPROLOL TARTRATE 25 MG PO TABS
25.0000 mg | ORAL_TABLET | Freq: Two times a day (BID) | ORAL | Status: DC
  Administered 2020-02-04 – 2020-02-19 (×28): 25 mg via ORAL
  Filled 2020-02-04 (×28): qty 1

## 2020-02-04 MED ORDER — MIDAZOLAM HCL 2 MG/2ML IJ SOLN
INTRAMUSCULAR | Status: AC
  Filled 2020-02-04: qty 2

## 2020-02-04 MED ORDER — MIDAZOLAM HCL 2 MG/2ML IJ SOLN
INTRAMUSCULAR | Status: AC | PRN
  Administered 2020-02-04: 1 mg via INTRAVENOUS

## 2020-02-04 MED ORDER — HEPARIN SODIUM (PORCINE) 1000 UNIT/ML IJ SOLN
INTRAMUSCULAR | Status: AC
  Administered 2020-02-04: 3800 [IU]
  Filled 2020-02-04: qty 4

## 2020-02-04 MED ORDER — LIDOCAINE HCL (PF) 1 % IJ SOLN
INTRAMUSCULAR | Status: AC
  Filled 2020-02-04: qty 30

## 2020-02-04 NOTE — Progress Notes (Signed)
Nephrology Progress Note:    Subjective:  Seen and examined on HD 139/63 and HR 69.  RIJ tunneled cathter.  Procedure supervised.  Had 700 mL UOP over 4/29.  IR was consulted for a renal biopsy - she is NPO for procedure.  She wasn't able to have bx on 4/29.  Review of systems:   Denies n/v Denies shortness of breath or chest pain  04/29 0701 - 04/30 0700 In: 60 [P.O.:60] Out: 700 [Urine:700]  Filed Weights   02/02/20 0645 02/02/20 0919 02/04/20 0714  Weight: 53.2 kg 54.1 kg 54 kg    Scheduled Meds: . amLODipine  5 mg Oral Daily  . Chlorhexidine Gluconate Cloth  6 each Topical Q0600  . darbepoetin (ARANESP) injection - NON-DIALYSIS  100 mcg Subcutaneous Q Thu-1800  . feeding supplement (NEPRO CARB STEADY)  237 mL Oral BID BM  . insulin aspart  0-5 Units Subcutaneous QHS  . insulin aspart  0-6 Units Subcutaneous TID WC  . mouth rinse  15 mL Mouth Rinse BID  . metoprolol tartrate  25 mg Oral BID  . polyethylene glycol  17 g Oral BID  . senna  2 tablet Oral BID   Continuous Infusions: . sodium chloride Stopped (01/13/20 0735)   PRN Meds:.sodium chloride, dextrose, ipratropium-albuterol, labetalol   Physical Exam:  General adult female in bed in no acute distress HEENT normocephalic atraumatic extraocular movements intact sclera anicteric Neck supple trachea midline Lungs clear to auscultation bilaterally normal work of breathing at rest  Heart S1S2 no rub Abdomen soft nontender nondistended Extremities no edema  Psych normal mood and affect Neuro - alert and oriented and conversant follows commands Access: RIJ tunneled dialysis catheter   Assessment/Plan:  1. AKI, nl baseline; previously felt likely ATN from hypovolemia/hemodynamic insults.  on CRRT through 4/6.  No iHD noted.  Following the RRT with persistently impaired clearance. Started iHD on 4/27 after tunneled catheter with IR.  Up/cr ratio 2110 mg/g, 0-5 RBC.  Presented with Cr 15.4 at [facility] from OSH.  Hx  reported DKA - HD today and assess needs daily.  Plan to hold over the weekend - Renal biopsy requested.  She is NPO for same today.  Biopsy form for [facility] on the front of chart - If does remain on HD renal will need to CLIP as AKI  - ANA, ANCA; SPEP, serum free light chains pending - note off of lovenox for now for procedure.  Can resume ppx per primary team after biopsy     2. Hyperkalemia: resolved.  Noted last got kayexalate x 2 on 4/25.  renal diet.  Stopped her multivitamin.  3. Anemia normocytic    1. Fe replete; setting of AKI   2. ESA Aranesp every Thurs - increased dose to 100 mcg starting with 4/29 dose  4. HTN  - started metoprolol - now at 25 mg BID  5. Metabolic Acidosis - managed with HD 6. DKA resolved 7. DM2 per primary team  8. COPD per primary team  9. Hypomag - repleted; recheck am   Recent Labs  Lab 02/01/20 0520 02/02/20 0432 02/03/20 0515  NA 137 137 136  K 4.7 4.1 3.6  CL 100 103 101  CO2 24 19* 24  GLUCOSE 101* 120* 119*  BUN 81* 45* 31*  CREATININE 6.58* 4.61* 3.65*  CALCIUM 7.9* 8.0* 7.6*  PHOS 5.4* 4.1 3.9   Recent Labs  Lab 01/30/20 0458 02/02/20 0432 02/03/20 0749  WBC 5.0 6.3 4.9  NEUTROABS  --  4.6  --   HGB 7.5* 7.7* 7.4*  HCT 23.5* 24.6* 23.8*  MCV 95.5 95.7 96.7  PLT 272 238 210     Claudia Desanctis, MD   02/04/2020 8:05 AM

## 2020-02-04 NOTE — Procedures (Signed)
Interventional Radiology Procedure Note  Procedure: US Guided Biopsy of right kidney  Complications: None  Estimated Blood Loss: < 10 mL  Findings: 16 G core biopsy of right kidney performed under US guidance.  Two core samples obtained and sent to Pathology.  Orvin Netter T. Adessa Primiano, M.D Pager:  [phone]    

## 2020-02-04 NOTE — Progress Notes (Signed)
PT Progress Note for Charges    02/04/20 1243  PT Visit Information  Last PT Received On 02/04/20  PT General Charges  $$ ACUTE PT VISIT 1 Visit  PT Treatments  $Gait Training 8-22 mins  Anastasio Champion, DPT  Acute Rehabilitation Services Pager [phone] Office [phone]

## 2020-02-04 NOTE — Progress Notes (Signed)
Physical Therapy Treatment Patient Details Name: Cindy Burns MRN: [id number] DOB: 07/21/1946 Today's Date: 02/04/2020    History of Present Illness Pt is a 74 year old woman admitted on 01/08/20 to [facility] and subsequently transferred to [facility] with respiratory failure requring intubation, DKA, UTI, hyperkalemia and renal failure requiring CRRT. Extubated 01/11/20. PMH: COPD, breast cancer, Dm, MDD.    PT Comments    Patient continues to make progress with functional mobility and activity tolerance. She ambulated ~300' with Rollator and supervision for safety, demonstrating dec L DF and compensating with inc L hip flexion, pt declined use of AFO, no instability or LOB noted. Pt demonstrates proper safety and handling with Rollator, Pt ambulating in room w/o AD but reaching for furniture for stability, 1 near-LOB noted standing donning mask, requiring minA to maintain balance. Pt tolerated additional strengthening exercises well, noting inc tightness in L gastroc soleus. Continue to recommend use of Rollator upon d/c home. Pt would continue to benefit from skilled physical therapy services at this time while admitted and after d/c to address the below listed limitations in order to improve overall safety and independence with functional mobility.   Follow Up Recommendations  Home health PT;Supervision - Intermittent     Equipment Recommendations  3in1 (PT);Other (comment)(Rollator)    Recommendations for Other Services       Precautions / Restrictions Precautions Precautions: Fall Precaution Comments: Compensates L foot drop with inc hip flexion Required Braces or Orthoses: Other Brace Other Brace: patient did not want to use AFO today Restrictions Weight Bearing Restrictions: No    Mobility  Bed Mobility Overal bed mobility: Modified Independent Bed Mobility: Sit to Supine       Sit to supine: Modified independent (Device/Increase time)   General bed mobility comments: mod I, inc  time, no physical assist needed  Transfers Overall transfer level: Needs assistance Equipment used: 4-wheeled walker Transfers: Sit to/from Stand Sit to Stand: Supervision         General transfer comment: supervision to sit from standing with Rollator, no instability noted  Ambulation/Gait Ambulation/Gait assistance: Supervision Gait Distance (Feet): 300 Feet Assistive device: 4-wheeled walker Gait Pattern/deviations: Step-through pattern Gait velocity: WFL   General Gait Details: mild foot drop noted w/o AFO but no LOB or instability noted with Rollator. Pt ambulating in room w/o AD w/inc instability, noted 1- near LOb requiring minA to steady, furniture walks w/o AD   Stairs             Wheelchair Mobility    Modified Rankin (Stroke Patients Only)       Balance Overall balance assessment: Needs assistance Sitting-balance support: No upper extremity supported;Feet supported Sitting balance-Leahy Scale: Good     Standing balance support: Bilateral upper extremity supported;During functional activity;Single extremity supported Standing balance-Leahy Scale: Poor Standing balance comment: reliant on UE support, 1 near-LOB while static standing donning mask- minA to steady                            Cognition Arousal/Alertness: Awake/alert Behavior During Therapy: WFL for tasks assessed/performed Overall Cognitive Status: Impaired/Different from baseline Area of Impairment: Memory;Safety/judgement;Awareness                   Current Attention Level: Selective Memory: Decreased short-term memory Following Commands: Follows one step commands with increased time Safety/Judgement: Decreased awareness of safety;Decreased awareness of deficits Awareness: Emergent Problem Solving: Slow processing;Difficulty sequencing;Requires verbal cues General Comments: Pt  with dec awareness of deficits, slow processing, requires visual cues      Exercises  General Exercises - Lower Extremity Ankle Circles/Pumps: AROM;Strengthening;Both;5 reps;Seated Gluteal Sets: Strengthening;Both;10 reps;Standing Long Arc Quad: Strengthening;Both;5 reps;Seated Hip Flexion/Marching: Strengthening;Both;10 reps;Standing Other Exercises Other Exercises: Passive L gastroc stretch 2 x 30s    General Comments        Pertinent Vitals/Pain Pain Assessment: Faces Faces Pain Scale: No hurt    Home Living                      Prior Function            PT Goals (current goals can now be found in the care plan section) Acute Rehab PT Goals PT Goal Formulation: With patient Time For Goal Achievement: 02/09/20 Potential to Achieve Goals: Good Progress towards PT goals: Progressing toward goals    Frequency    Min 3X/week      PT Plan Current plan remains appropriate    Co-evaluation              AM-PAC PT "6 Clicks" Mobility   Outcome Measure  Help needed turning from your back to your side while in a flat bed without using bedrails?: None Help needed moving from lying on your back to sitting on the side of a flat bed without using bedrails?: None Help needed moving to and from a bed to a chair (including a wheelchair)?: None Help needed standing up from a chair using your arms (e.g., wheelchair or bedside chair)?: None Help needed to walk in hospital room?: A Little Help needed climbing 3-5 steps with a railing? : A Little 6 Click Score: 22    End of Session Equipment Utilized During Treatment: Gait belt Activity Tolerance: Patient tolerated treatment well Patient left: in bed;with call bell/phone within reach;with bed alarm set Nurse Communication: Mobility status PT Visit Diagnosis: Unsteadiness on feet (R26.81);Other abnormalities of gait and mobility (R26.89);Muscle weakness (generalized) (M62.81)     Time: 7414-2395 PT Time Calculation (min) (ACUTE ONLY): 17 min  Charges:  $Gait Training: 8-22 mins                      Robie Oats, SPT Acute Rehab  [phone]    Cindy Burns 02/04/2020, 12:53 PM

## 2020-02-04 NOTE — Progress Notes (Signed)
Progress Note    Cindy Burns  OBS:[id number] DOB: [birth year]  DOA: 01/08/2020 PCP: [name], MD    Brief Narrative:    Medical records reviewed and are as summarized below:  Cindy Burns is an 74 y.o. female with a past medical history significant for breast cancer, COPD, tobacco use, diabetes, MDD was admitted April 3 with acute renal failure requiring CRRT, hyperkalemia, anion gap metabolic acidosis secondary to DKA, acute respiratory failure requiring intubation and extubated on April 6.  She has had persistent ongoing kidney failure.  Followed by nephrology and dialysis was started April 27.  She is having her third dialysis session today and is also scheduled for renal biopsy.  Nephrology note indicates she will be held over the weekend  Assessment/Plan:   Principal Problem:   Renal failure Active Problems:   Hypocalcemia   Metabolic acidosis   Acute respiratory failure with hypoxia ([place])   [employer]   Hypertension   UTI (urinary tract infection)   Pressure injury of skin   Encounter for central line placement   Pain and swelling of ankle, left  Acute kidney injury secondary to severe volume depletion in the setting of DKA/UTI/metabolic acidosis.  She was on CRRT through April 6.  Dialysis started April 27 was evaluated daily having her third dialysis session today.  Also scheduled for renal biopsy today. -Follow biopsy results -Dialysis per nephrology -Further management per nephrology  Acute respiratory failure with hypoxia in the setting of COPD and former tobacco use.  She was intubated on admission as noted above and extubated 2 days later.  Her oxygen saturation levels remained greater than 90% on room air. -Incentive spirometry -As needed nebulizers -Encourage mobilization  Hyperkalemia.  Intermittent.  Resolved.  Urinary tract infection.  Pansensitive E. coli noted on culture.  Blood cultures with no growth to date.  She has remained afebrile  hemodynamically stable and nontoxic-appearing.  She completed a 7-day course of Rocephin  Normocytic anemia.  Stable.  No sign symptoms of active bleeding. -ESA aranesp every Thursday per nephrology -monitor  Diabetes type II.  Uncontrolled.  History of noncompliance.  Hemoglobin A1c 8.5. -Sliding scale for optimal control  Left foot drop/pain and swelling of left ankle.  X-ray of left ankle negative.  MRI of the lumbar spine revealed disc bulging at L3 and L4 and disc protrusion L5-S1 moderate facet hypertrophy at L5-S1.  Evaluated by neurosurgery who opined no immediate intervention needed.  Recommended outpatient follow-up and possible EMG.  She was provided with AFO/dorsiflexion brace  Pressure injury coccyx stage II as well as left heel deep tissue pressure injury, partial thickness loss of dermis -WOC recommendation  Family Communication/Anticipated D/C date and plan/Code Status   DVT prophylaxis: .holding heparin for now in anticipation of procedure Code Status: Full Code.  Family Communication: patient Disposition Plan: Status is: Inpatient  Remains inpatient appropriate because:Inpatient level of care appropriate due to severity of illness   Dispo: The patient is from: Home              Anticipated d/c is to: Home              Anticipated d/c date is: > 3 days              Patient currently is not medically stable to d/c.          Medical Consultants:    [facility] nephrology.   Anti-Infectives:    None  Subjective:  Alert denies pain/discomfort  Objective:    Vitals:   02/04/20 1000 02/04/20 1025 02/04/20 1030 02/04/20 1100  BP: (!) 165/72 (!) 173/74 (!) 179/76 (!) 166/59  Pulse: 72 76 76 76  Resp:   16 16  Temp:   98.6 F (37 C) 98.2 F (36.8 C)  TempSrc:   Oral Oral  SpO2:   99% 100%  Weight:   54.1 kg   Height:        Intake/Output Summary (Last 24 hours) at 02/04/2020 1108 Last data filed at 02/04/2020 1030 Gross per 24 hour  Intake 60  ml  Output 1200 ml  Net -1140 ml   Filed Weights   02/02/20 0919 02/04/20 0714 02/04/20 1030  Weight: 54.1 kg 54 kg 54.1 kg    Exam: General: Awake alert oriented x3 no acute distress CV regular rate and rhythm no murmur gallop or rub no lower extremity edema Respiratory no increased work of breathing breath sounds clear bilaterally with good air movement I hear no rhonchi no wheeze Abdomen: Soft positive bowel sounds nontender to palpation no guarding or rebounding Musculoskeletal: Joints without swelling/erythema brace to left foot intact Neuro alert and oriented x3 speech clear facial symmetry cranial nerves II through XII grossly intact  Data Reviewed:   I have personally reviewed following labs and imaging studies:  Labs: Labs show the following:   Basic Metabolic Panel: Recent Labs  Lab 01/31/20 0613 01/31/20 0613 02/01/20 0520 02/01/20 0520 02/02/20 0432 02/02/20 0432 02/03/20 0515 02/04/20 0500  NA 137  --  137  --  137  --  136 134*  K 4.5   < > 4.7   < > 4.1   < > 3.6 3.7  CL 97*  --  100  --  103  --  101 100  CO2 27  --  24  --  19*  --  24 25  GLUCOSE 105*  --  101*  --  120*  --  119* 121*  BUN 85*  --  81*  --  45*  --  31* 43*  CREATININE 6.64*  --  6.58*  --  4.61*  --  3.65* 4.87*  CALCIUM 8.0*  --  7.9*  --  8.0*  --  7.6* 8.1*  MG  --   --  1.6*  --   --   --  1.5*  --   PHOS 5.6*  --  5.4*  --  4.1  --  3.9 4.9*   < > = values in this interval not displayed.   GFR Estimated Creatinine Clearance: 8.4 mL/min (A) (by C-G formula based on SCr of 4.87 mg/dL (H)). Liver Function Tests: Recent Labs  Lab 01/31/20 6195 02/01/20 0520 02/02/20 0432 02/03/20 0515 02/04/20 0500  ALBUMIN 2.3* 2.3* 2.4* 2.2* 2.3*   No results for input(s): LIPASE, AMYLASE in the last 168 hours. No results for input(s): AMMONIA in the last 168 hours. Coagulation profile Recent Labs  Lab 02/01/20 0938  INR 1.1    CBC: Recent Labs  Lab 01/30/20 0458  02/02/20 0432 02/03/20 0749 02/04/20 0748  WBC 5.0 6.3 4.9 6.6  NEUTROABS  --  4.6  --   --   HGB 7.5* 7.7* 7.4* 7.5*  HCT 23.5* 24.6* 23.8* 24.4*  MCV 95.5 95.7 96.7 97.6  PLT 272 238 210 203   Cardiac Enzymes: No results for input(s): CKTOTAL, CKMB, CKMBINDEX, TROPONINI in the last 168 hours. BNP (last 3 results) No results for  input(s): PROBNP in the last 8760 hours. CBG: Recent Labs  Lab 02/03/20 1630 02/03/20 2128 02/04/20 0055 02/04/20 0453 02/04/20 1101  GLUCAP 192* 109* 115* 119* 91   D-Dimer: No results for input(s): DDIMER in the last 72 hours. Hgb A1c: No results for input(s): HGBA1C in the last 72 hours. Lipid Profile: No results for input(s): CHOL, HDL, LDLCALC, TRIG, CHOLHDL, LDLDIRECT in the last 72 hours. Thyroid function studies: No results for input(s): TSH, T4TOTAL, T3FREE, THYROIDAB in the last 72 hours.  Invalid input(s): FREET3 Anemia work up: No results for input(s): VITAMINB12, FOLATE, FERRITIN, TIBC, IRON, RETICCTPCT in the last 72 hours. Sepsis Labs: Recent Labs  Lab 01/30/20 0458 02/02/20 0432 02/03/20 0749 02/04/20 0748  WBC 5.0 6.3 4.9 6.6    Microbiology No results found for this or any previous visit (from the past 240 hour(s)).  Procedures and diagnostic studies:  No results found.  Medications:   . amLODipine  5 mg Oral Daily  . Chlorhexidine Gluconate Cloth  6 each Topical Q0600  . darbepoetin (ARANESP) injection - NON-DIALYSIS  100 mcg Subcutaneous Q Thu-1800  . feeding supplement (NEPRO CARB STEADY)  237 mL Oral BID BM  . insulin aspart  0-5 Units Subcutaneous QHS  . insulin aspart  0-6 Units Subcutaneous TID WC  . mouth rinse  15 mL Mouth Rinse BID  . metoprolol tartrate  25 mg Oral BID  . polyethylene glycol  17 g Oral BID  . senna  2 tablet Oral BID   Continuous Infusions: . sodium chloride Stopped (01/13/20 0735)     LOS: 27 days   Radene Gunning NP  [facility]   How to contact the [facility]  Attending or Consulting provider Ali Molina or covering provider during after hours [place], for this patient?  1. Check the care team in [facility] and look for a) attending/consulting [facility] provider listed and b) the [facility] team listed 2. Log into [email or web address] and use [place]'s universal password to access. If you do not have the password, please contact the hospital operator. 3. Locate the [facility] provider you are looking for under [facility] and page to a number that you can be directly reached. 4. If you still have difficulty reaching the provider, please page the [facility] (Director on Call) for the Hospitalists listed on amion for assistance.  02/04/2020, 11:08 AM

## 2020-02-05 LAB — CBC
HCT: 26.4 % — ABNORMAL LOW (ref 36.0–46.0)
Hemoglobin: 8.4 g/dL — ABNORMAL LOW (ref 12.0–15.0)
MCH: 30.8 pg (ref 26.0–34.0)
MCHC: 31.8 g/dL (ref 30.0–36.0)
MCV: 96.7 fL (ref 80.0–100.0)
Platelets: 195 10*3/uL (ref 150–400)
RBC: 2.73 MIL/uL — ABNORMAL LOW (ref 3.87–5.11)
RDW: 18.5 % — ABNORMAL HIGH (ref 11.5–15.5)
WBC: 12.4 10*3/uL — ABNORMAL HIGH (ref 4.0–10.5)
nRBC: 0 % (ref 0.0–0.2)

## 2020-02-05 LAB — RENAL FUNCTION PANEL
Albumin: 2.5 g/dL — ABNORMAL LOW (ref 3.5–5.0)
Anion gap: 12 (ref 5–15)
BUN: 24 mg/dL — ABNORMAL HIGH (ref 8–23)
CO2: 23 mmol/L (ref 22–32)
Calcium: 8.2 mg/dL — ABNORMAL LOW (ref 8.9–10.3)
Chloride: 100 mmol/L (ref 98–111)
Creatinine, Ser: 3.46 mg/dL — ABNORMAL HIGH (ref 0.44–1.00)
GFR calc Af Amer: 15 mL/min — ABNORMAL LOW (ref >60)
GFR calc non Af Amer: 13 mL/min — ABNORMAL LOW (ref >60)
Glucose, Bld: 145 mg/dL — ABNORMAL HIGH (ref 70–99)
Phosphorus: 3.2 mg/dL (ref 2.5–4.6)
Potassium: 3.9 mmol/L (ref 3.5–5.1)
Sodium: 135 mmol/L (ref 135–145)

## 2020-02-05 LAB — GLUCOSE, CAPILLARY
Glucose-Capillary: 147 mg/dL — ABNORMAL HIGH (ref 70–99)
Glucose-Capillary: 125 mg/dL — ABNORMAL HIGH (ref 70–99)
Glucose-Capillary: 227 mg/dL — ABNORMAL HIGH (ref 70–99)
Glucose-Capillary: 141 mg/dL — ABNORMAL HIGH (ref 70–99)

## 2020-02-05 LAB — MAGNESIUM: Magnesium: 1.6 mg/dL — ABNORMAL LOW (ref 1.7–2.4)

## 2020-02-05 MED ORDER — SODIUM CHLORIDE 0.9 % IV SOLN: INTRAVENOUS | Status: AC

## 2020-02-05 MED ORDER — AMLODIPINE BESYLATE 10 MG PO TABS
10.0000 mg | ORAL_TABLET | Freq: Every day | ORAL | Status: DC
  Administered 2020-02-05 – 2020-02-19 (×15): 10 mg via ORAL
  Filled 2020-02-05 (×14): qty 1

## 2020-02-05 MED ORDER — LABETALOL HCL 5 MG/ML IV SOLN: 5.0000 mg | INTRAVENOUS | Status: DC | PRN

## 2020-02-05 MED ORDER — MAGNESIUM SULFATE 2 GM/50ML IV SOLN
2.0000 g | Freq: Once | INTRAVENOUS | Status: AC
  Administered 2020-02-05: 2 g via INTRAVENOUS
  Filled 2020-02-05: qty 50

## 2020-02-05 MED ORDER — SODIUM CHLORIDE 0.9 % IV SOLN: INTRAVENOUS | Status: DC

## 2020-02-05 MED ORDER — MAGNESIUM OXIDE 400 (241.3 MG) MG PO TABS
400.0000 mg | ORAL_TABLET | Freq: Every day | ORAL | Status: DC
  Administered 2020-02-06 – 2020-02-19 (×14): 400 mg via ORAL
  Filled 2020-02-05 (×14): qty 1

## 2020-02-05 NOTE — Progress Notes (Signed)
PROGRESS NOTE    Cindy Burns  VFI:[id number] DOB: 1946-02-12 DOA: 01/08/2020 PCP: [name], MD   Brief Narrative:  Cindy Burns is an 74 y.o. female with a past medical history significant for breast cancer, COPD, tobacco use, diabetes, MDD was admitted April 3 with acute renal failure requiring CRRT, hyperkalemia, anion gap metabolic acidosis secondary to DKA, acute respiratory failure requiring intubation and extubated on April 6.  She has had persistent ongoing kidney failure.  Followed by nephrology and dialysis was started April 27.  She underwent renal biopsy on 02/04/2020.  Assessment & Plan:   Principal Problem:   Renal failure Active Problems:   CIGARETTE SMOKER   Hypertension   Pressure injury of skin   Encounter for central line placement   Pain and swelling of ankle, left   Hypocalcemia   Metabolic acidosis   Acute respiratory failure with hypoxia (HCC)   UTI (urinary tract infection)   Acute kidney injury secondary to severe volume depletion in the setting of DKA/UTI/metabolic acidosis.  She was on CRRT through April 6.  Dialysis started April 27.  Status post renal biopsy by IR on 02/04/2020.  Management per nephrology.  Acute respiratory failure with hypoxia in the setting of COPD and former tobacco use.  She was intubated on admission as noted above and extubated 2 days later.  Her oxygen saturation levels remained greater than 90% on room air. -Incentive spirometry -As needed nebulizers -Encourage mobilization  Hyperkalemia.  Intermittent.  Resolved.   Urinary tract infection.  Pansensitive E. coli noted on culture.  Blood cultures with no growth to date.  She has remained afebrile hemodynamically stable and nontoxic-appearing.  She completed a 7-day course of Rocephin  Normocytic anemia.  Stable.  No sign symptoms of active bleeding. -ESA aranesp every Thursday per nephrology -monitor  Diabetes type II.  Uncontrolled.  History of noncompliance.   Hemoglobin A1c 8.5. -Sliding scale for optimal control  Left foot drop/pain and swelling of left ankle.  X-ray of left ankle negative.  MRI of the lumbar spine revealed disc bulging at L3 and L4 and disc protrusion L5-S1 moderate facet hypertrophy at L5-S1.  Evaluated by neurosurgery who opined no immediate intervention needed.  Recommended outpatient follow-up and possible EMG.  She was provided with AFO/dorsiflexion brace  Pressure injury coccyx stage II as well as left heel deep tissue pressure injury, partial thickness loss of dermis -WOC recommendation  DVT prophylaxis: Heparin   Code Status: Full Code  Family Communication:  None present at bedside.  Plan of care discussed with patient in length and he verbalized understanding and agreed with it. Patient is from: Home Disposition Plan: Home Barriers to discharge: Continued hemodialysis, needs clip  Status is: Inpatient  Remains inpatient appropriate because:Inpatient level of care appropriate due to severity of illness   Dispo: The patient is from: Home              Anticipated d/c is to: Home              Anticipated d/c date is: 3 days              Patient currently is not medically stable to d/c.         Estimated body mass index is 21.55 kg/m as calculated from the following:   Height as of this encounter: 5\' 2"  (1.575 m).   Weight as of this encounter: 53.4 kg.  Pressure Injury 01/08/20 Coccyx Medial Stage 2 -  Partial thickness loss of dermis presenting as a shallow open injury with a red, pink wound bed without slough. small area of broken skin on coccyx (Active)  01/08/20 2200  Location: Coccyx  Location Orientation: Medial  Staging: Stage 2 -  Partial thickness loss of dermis presenting as a shallow open injury with a red, pink wound bed without slough.  Wound Description (Comments): small area of broken skin on coccyx  Present on Admission: Yes     Pressure Injury 01/09/20 Heel Left Deep Tissue Pressure  Injury - Purple or maroon localized area of discolored intact skin or blood-filled blister due to damage of underlying soft tissue from pressure and/or shear. purplr color with red circle around it (Active)  01/09/20 1000  Location: Heel  Location Orientation: Left  Staging: Deep Tissue Pressure Injury - Purple or maroon localized area of discolored intact skin or blood-filled blister due to damage of underlying soft tissue from pressure and/or shear.  Wound Description (Comments): purplr color with red circle around it  Present on Admission: Yes     Nutritional status:  Nutrition Problem: Inadequate oral intake Etiology: inability to eat   Signs/Symptoms: NPO status   Interventions: Tube feeding, MVI    Consultants:   Nephrology  Procedures:   Renal biopsy 02/04/2020  Antimicrobials:  Anti-infectives (From admission, onward)   Start     Dose/Rate Route Frequency Ordered Stop   02/01/20 0900  ceFAZolin (ANCEF) IVPB 2g/100 mL premix     2 g 200 mL/hr over 30 Minutes Intravenous To Radiology 02/01/20 0853 02/01/20 1815   01/08/20 2315  cefTRIAXone (ROCEPHIN) 1 g in sodium chloride 0.9 % 100 mL IVPB     1 g 200 mL/hr over 30 Minutes Intravenous Every 24 hours 01/08/20 2224 01/14/20 2313         Subjective: Patient seen and examined.  She tells me that she has had 5 bowel movements yesterday however all of them were formed.  No other complaint.  We will discontinue her MiraLAX and Colace.  Objective: Vitals:   02/04/20 1745 02/04/20 2218 02/05/20 0530 02/05/20 0943  BP: (!) 141/62 (!) 149/67 (!) 177/63 (!) 162/54  Pulse: 71 75 81 76  Resp: 18 17 16 18   Temp: 98.6 F (37 C) 98.9 F (37.2 C) 98.7 F (37.1 C) 98.2 F (36.8 C)  TempSrc: Oral Oral Oral Oral  SpO2: 95% 97% 97% 98%  Weight:   53.4 kg   Height:        Intake/Output Summary (Last 24 hours) at 02/05/2020 1050 Last data filed at 02/05/2020 1022 Gross per 24 hour  Intake 180 ml  Output 0 ml  Net 180  ml   Filed Weights   02/04/20 0714 02/04/20 1030 02/05/20 0530  Weight: 54 kg 54.1 kg 53.4 kg    Examination:  General exam: Appears calm and comfortable  Respiratory system: Clear to auscultation. Respiratory effort normal. Cardiovascular system: S1 & S2 heard, RRR. No JVD, murmurs, rubs, gallops or clicks. No pedal edema. Gastrointestinal system: Abdomen is nondistended, soft and nontender. No organomegaly or masses felt. Normal bowel sounds heard. Central nervous system: Alert and oriented. No focal neurological deficits. Extremities: Symmetric 5 x 5 power. Skin: No rashes, lesions or ulcers Psychiatry: Judgement and insight appear normal. Mood & affect appropriate.    Data Reviewed: I have personally reviewed following labs and imaging studies  CBC: Recent Labs  Lab 01/30/20 0458 02/02/20 0432 02/03/20 0749 02/04/20 0748  WBC 5.0 6.3 4.9 6.6  NEUTROABS  --  4.6  --   --   HGB 7.5* 7.7* 7.4* 7.5*  HCT 23.5* 24.6* 23.8* 24.4*  MCV 95.5 95.7 96.7 97.6  PLT 272 238 210 673   Basic Metabolic Panel: Recent Labs  Lab 02/01/20 0520 02/02/20 0432 02/03/20 0515 02/04/20 0500 02/05/20 0441  NA 137 137 136 134* 135  K 4.7 4.1 3.6 3.7 3.9  CL 100 103 101 100 100  CO2 24 19* 24 25 23   GLUCOSE 101* 120* 119* 121* 145*  BUN 81* 45* 31* 43* 24*  CREATININE 6.58* 4.61* 3.65* 4.87* 3.46*  CALCIUM 7.9* 8.0* 7.6* 8.1* 8.2*  MG 1.6*  --  1.5*  --  1.6*  PHOS 5.4* 4.1 3.9 4.9* 3.2   GFR: Estimated Creatinine Clearance: 11.8 mL/min (A) (by C-G formula based on SCr of 3.46 mg/dL (H)). Liver Function Tests: Recent Labs  Lab 02/01/20 0520 02/02/20 0432 02/03/20 0515 02/04/20 0500 02/05/20 0441  ALBUMIN 2.3* 2.4* 2.2* 2.3* 2.5*   No results for input(s): LIPASE, AMYLASE in the last 168 hours. No results for input(s): AMMONIA in the last 168 hours. Coagulation Profile: Recent Labs  Lab 02/01/20 0938 02/04/20 1112  INR 1.1 1.0   Cardiac Enzymes: No results for  input(s): CKTOTAL, CKMB, CKMBINDEX, TROPONINI in the last 168 hours. BNP (last 3 results) No results for input(s): PROBNP in the last 8760 hours. HbA1C: No results for input(s): HGBA1C in the last 72 hours. CBG: Recent Labs  Lab 02/04/20 0453 02/04/20 1101 02/04/20 1623 02/04/20 2219 02/05/20 0735  GLUCAP 119* 91 95 121* 147*   Lipid Profile: No results for input(s): CHOL, HDL, LDLCALC, TRIG, CHOLHDL, LDLDIRECT in the last 72 hours. Thyroid Function Tests: No results for input(s): TSH, T4TOTAL, FREET4, T3FREE, THYROIDAB in the last 72 hours. Anemia Panel: No results for input(s): VITAMINB12, FOLATE, FERRITIN, TIBC, IRON, RETICCTPCT in the last 72 hours. Sepsis Labs: No results for input(s): PROCALCITON, LATICACIDVEN in the last 168 hours.  Recent Results (from the past 240 hour(s))  Culture, Urine     Status: Abnormal   Collection Time: 02/03/20  5:35 PM   Specimen: Urine, Random  Result Value Ref Range Status   Specimen Description URINE, RANDOM  Final   Special Requests   Final    NONE Performed at [facility], [address], [place], [place] [address]    Culture MULTIPLE SPECIES PRESENT, SUGGEST RECOLLECTION (A)  Final   Report Status 02/04/2020 FINAL  Final      Radiology Studies: US BIOPSY (KIDNEY)  Result Date: 02/04/2020 INDICATION: Renal failure, proteinuria and need for renal biopsy. EXAM: ULTRASOUND GUIDED CORE BIOPSY OF RIGHT KIDNEY MEDICATIONS: None. ANESTHESIA/SEDATION: Fentanyl 50 mcg IV; Versed 1.0 mg IV Moderate Sedation Time:  12 minutes. The patient was continuously monitored during the procedure by the interventional radiology nurse under my direct supervision. PROCEDURE: The procedure, risks, benefits, and alternatives were explained to the patient. Questions regarding the procedure were encouraged and answered. The patient understands and consents to the procedure. A time-out was performed prior to initiating the procedure. Ultrasound was  performed of both kidneys in a prone position. The right flank region was prepped with chlorhexidine in a sterile fashion, and a sterile drape was applied covering the operative field. A sterile gown and sterile gloves were used for the procedure. Local anesthesia was provided with 1% Lidocaine. Under ultrasound guidance, 2 separate 16 gauge core biopsy samples were obtained of the right kidney at the level of  lower pole cortex. Core biopsy samples were submitted in saline. Additional ultrasound was performed. COMPLICATIONS: None immediate. FINDINGS: Solid and intact core biopsy samples were obtained. IMPRESSION: Ultrasound-guided core biopsy performed at the level of right lower pole renal cortex. Electronically Signed   By: Aletta Edouard M.D.   On: 02/04/2020 19:04    Scheduled Meds: . amLODipine  10 mg Oral Daily  . Chlorhexidine Gluconate Cloth  6 each Topical Q0600  . darbepoetin (ARANESP) injection - NON-DIALYSIS  100 mcg Subcutaneous Q Thu-1800  . feeding supplement (NEPRO CARB STEADY)  237 mL Oral BID BM  . insulin aspart  0-5 Units Subcutaneous QHS  . insulin aspart  0-6 Units Subcutaneous TID WC  . [START ON 02/06/2020] magnesium oxide  400 mg Oral Daily  . mouth rinse  15 mL Mouth Rinse BID  . metoprolol tartrate  25 mg Oral BID   Continuous Infusions: . sodium chloride Stopped (01/13/20 0735)  . magnesium sulfate bolus IVPB 2 g (02/05/20 0959)     LOS: 28 days   Time spent: 30 minutes   Darliss Cheney, MD [facility]  02/05/2020, 10:50 AM   To contact the attending provider between 7A-7P or the covering provider during after hours 7P-7A, please log into the web site [email or web address].

## 2020-02-05 NOTE — Progress Notes (Signed)
Nephrology Progress Note:    Subjective:  Had renal biopsy on 4/30 PM - states went ok and wasn't really uncomfortable.  Had 3 unmeasured voids on 4/30.  Had HD on 4/30 and was kept even.    Review of systems:   Nausea - thinks she overate last night after being NPO for the day for the procedure Denies shortness of breath or chest pain  04/30 0701 - 05/01 0700 In: 120 [P.O.:120] Out: 0   Filed Weights   02/04/20 0714 02/04/20 1030 02/05/20 0530  Weight: 54 kg 54.1 kg 53.4 kg    Scheduled Meds: . amLODipine  10 mg Oral Daily  . Chlorhexidine Gluconate Cloth  6 each Topical Q0600  . darbepoetin (ARANESP) injection - NON-DIALYSIS  100 mcg Subcutaneous Q Thu-1800  . feeding supplement (NEPRO CARB STEADY)  237 mL Oral BID BM  . insulin aspart  0-5 Units Subcutaneous QHS  . insulin aspart  0-6 Units Subcutaneous TID WC  . mouth rinse  15 mL Mouth Rinse BID  . metoprolol tartrate  25 mg Oral BID   Continuous Infusions: . sodium chloride Stopped (01/13/20 0735)   PRN Meds:.sodium chloride, dextrose, ipratropium-albuterol, labetalol   Physical Exam:   General adult female in bed in no acute distress HEENT normocephalic atraumatic extraocular movements intact sclera anicteric Neck supple trachea midline Lungs clear to auscultation bilaterally normal work of breathing at rest  Heart S1S2 no rub Abdomen soft nontender nondistended Extremities no edema  Psych normal mood and affect Neuro - alert and oriented and conversant follows commands Access: RIJ tunneled dialysis catheter   Assessment/Plan:  1. AKI, nl baseline; previously felt likely ATN from hypovolemia/hemodynamic insults.  on CRRT through 4/6.  No iHD noted.  Following the RRT with persistently impaired clearance. Started iHD on 4/27 after tunneled catheter with IR.  Up/cr ratio 2110 mg/g, 0-5 RBC.  Presented with Cr 15.4 at [facility] from OSH.  Hx reported DKA.  SPEP without M spike and free light chains ratio wnl. - Assess  needs daily.  Plan to hold over the weekend with possible HD on 5/3 if needed  - s/p Renal biopsy on 4/30.  Will need to follow-up path  - Discussed with HD SW - will need to CLIP as AKI if does remain on HD  - ANA, ANCA still pending - note off of lovenox for now for procedure.  Can resume desired ppx per primary team     2. Hyperkalemia: resolved.  Noted last got kayexalate x 2 on 4/25.  renal diet.  Stopped her multivitamin.  3. Anemia normocytic    1. Fe replete; setting of AKI   2. ESA Aranesp every Thurs - increased dose to 100 mcg starting with 4/29 dose  3. Update CBC today  4. HTN  - amlodipine was increased to 10 mg daily  5. Metabolic Acidosis - managed with HD 6. DKA resolved 7. DM2 per primary team  8. COPD per primary team  9. Hypomag - mag 2 gram IV now then 400 mg daily    Recent Labs  Lab 02/03/20 0515 02/04/20 0500 02/05/20 0441  NA 136 134* 135  K 3.6 3.7 3.9  CL 101 100 100  CO2 24 25 23   GLUCOSE 119* 121* 145*  BUN 31* 43* 24*  CREATININE 3.65* 4.87* 3.46*  CALCIUM 7.6* 8.1* 8.2*  PHOS 3.9 4.9* 3.2   Recent Labs  Lab 02/02/20 0432 02/03/20 0749 02/04/20 0748  WBC 6.3 4.9 6.6  NEUTROABS 4.6  --   --   HGB 7.7* 7.4* 7.5*  HCT 24.6* 23.8* 24.4*  MCV 95.7 96.7 97.6  PLT 238 210 203     Claudia Desanctis, MD   02/05/2020 9:22 AM

## 2020-02-06 LAB — RENAL FUNCTION PANEL
Albumin: 2.3 g/dL — ABNORMAL LOW (ref 3.5–5.0)
Anion gap: 11 (ref 5–15)
BUN: 32 mg/dL — ABNORMAL HIGH (ref 8–23)
CO2: 23 mmol/L (ref 22–32)
Calcium: 8 mg/dL — ABNORMAL LOW (ref 8.9–10.3)
Chloride: 102 mmol/L (ref 98–111)
Creatinine, Ser: 4.77 mg/dL — ABNORMAL HIGH (ref 0.44–1.00)
GFR calc Af Amer: 10 mL/min — ABNORMAL LOW (ref >60)
GFR calc non Af Amer: 9 mL/min — ABNORMAL LOW (ref >60)
Glucose, Bld: 131 mg/dL — ABNORMAL HIGH (ref 70–99)
Phosphorus: 4.6 mg/dL (ref 2.5–4.6)
Potassium: 4.2 mmol/L (ref 3.5–5.1)
Sodium: 136 mmol/L (ref 135–145)

## 2020-02-06 LAB — GLUCOSE, CAPILLARY
Glucose-Capillary: 110 mg/dL — ABNORMAL HIGH (ref 70–99)
Glucose-Capillary: 171 mg/dL — ABNORMAL HIGH (ref 70–99)
Glucose-Capillary: 129 mg/dL — ABNORMAL HIGH (ref 70–99)
Glucose-Capillary: 183 mg/dL — ABNORMAL HIGH (ref 70–99)

## 2020-02-06 MED ORDER — ENOXAPARIN SODIUM 30 MG/0.3ML ~~LOC~~ SOLN
30.0000 mg | SUBCUTANEOUS | Status: DC
  Administered 2020-02-06 – 2020-02-19 (×14): 30 mg via SUBCUTANEOUS
  Filled 2020-02-06 (×13): qty 0.3

## 2020-02-06 MED ORDER — CHLORHEXIDINE GLUCONATE CLOTH 2 % EX PADS
6.0000 | MEDICATED_PAD | Freq: Every day | CUTANEOUS | Status: DC
  Administered 2020-02-08 – 2020-02-13 (×4): 6 via TOPICAL

## 2020-02-06 MED ORDER — SODIUM CHLORIDE 0.9 % IV SOLN: INTRAVENOUS | Status: AC

## 2020-02-06 NOTE — Progress Notes (Signed)
Nephrology Progress Note:    Subjective:  strict ins/outs not available.  Had 3 unmeasured voids on 5/1.  Had HD on 4/30 and was kept even.  Feels well.  Lives in [place].  Review of systems:  Denies n/v today  Denies shortness of breath  Denies chest pain    05/01 0701 - 05/02 0700 In: 1958.2 [P.O.:540; I.V.:1418.2] Out: 300 [Urine:300]  Filed Weights   02/04/20 0714 02/04/20 1030 02/05/20 0530  Weight: 54 kg 54.1 kg 53.4 kg    Scheduled Meds: . amLODipine  10 mg Oral Daily  . Chlorhexidine Gluconate Cloth  6 each Topical Q0600  . darbepoetin (ARANESP) injection - NON-DIALYSIS  100 mcg Subcutaneous Q Thu-1800  . feeding supplement (NEPRO CARB STEADY)  237 mL Oral BID BM  . insulin aspart  0-5 Units Subcutaneous QHS  . insulin aspart  0-6 Units Subcutaneous TID WC  . magnesium oxide  400 mg Oral Daily  . mouth rinse  15 mL Mouth Rinse BID  . metoprolol tartrate  25 mg Oral BID   Continuous Infusions: . sodium chloride Stopped (01/13/20 0735)   PRN Meds:.sodium chloride, dextrose, ipratropium-albuterol, labetalol   Physical Exam:    General adult female in bed in no acute distress HEENT normocephalic atraumatic extraocular movements intact sclera anicteric Neck supple trachea midline Lungs clear to auscultation bilaterally normal work of breathing at rest  Heart S1S2 no rub Abdomen soft nontender nondistended Extremities no edema  Psych normal mood and affect Neuro - alert and oriented and conversant follows commands Access: RIJ tunneled dialysis catheter   Assessment/Plan:  1. AKI, nl baseline; previously felt likely ATN from hypovolemia/hemodynamic insults.  on CRRT through 4/6.  No iHD noted.  Following the RRT with persistently impaired clearance. Started iHD on 4/27 after tunneled catheter with IR.  Up/cr ratio 2110 mg/g, 0-5 RBC.  Presented with Cr 15.4 at [facility] from OSH.  Hx reported DKA and may have reduced reserve with DM.  SPEP without M spike and free  light chains ratio wnl. - no need for HD today.  Assess needs daily.  Anticipate HD on 5/3 unless clearance improves any with fluids - NS at 75 ml/hr x 12 hours today - s/p Renal biopsy on 4/30.  Will need to follow-up path  - Discussed with HD SW - will need to CLIP as AKI if does remain on HD  - ANA, ANCA still pending   - resumed lovenox for dvt ppx     2. Hyperkalemia: resolved.  Renal diet  3. Anemia normocytic    1. Fe replete; setting of AKI   2. Stable  3. ESA Aranesp every Thurs - increased dose to 100 mcg starting with 4/29 dose  4. HTN  - amlodipine was increased to 10 mg daily  5. Metabolic Acidosis - managed with HD 6. DKA resolved 7. DM2 per primary team  8. COPD per primary team  9. Hypomag - on 400 mg daily mag oxide; mag in AM   Recent Labs  Lab 02/04/20 0500 02/05/20 0441 02/06/20 0852  NA 134* 135 136  K 3.7 3.9 4.2  CL 100 100 102  CO2 25 23 23   GLUCOSE 121* 145* 131*  BUN 43* 24* 32*  CREATININE 4.87* 3.46* 4.77*  CALCIUM 8.1* 8.2* 8.0*  PHOS 4.9* 3.2 4.6   Recent Labs  Lab 02/02/20 0432 02/02/20 0432 02/03/20 0749 02/04/20 0748 02/05/20 1042  WBC 6.3   < > 4.9 6.6 12.4*  NEUTROABS 4.6  --   --   --   --  HGB 7.7*   < > 7.4* 7.5* 8.4*  HCT 24.6*   < > 23.8* 24.4* 26.4*  MCV 95.7   < > 96.7 97.6 96.7  PLT 238   < > 210 203 195   < > = values in this interval not displayed.     Claudia Desanctis, MD   02/06/2020 10:30 AM

## 2020-02-06 NOTE — Social Work (Signed)
CSW consulted to assist patient with paying light bill. CSW met with patient and provided alternatives for paying her bill. CSW provided patient with phone numbers for [employer].   [employer], LCSWA

## 2020-02-06 NOTE — Progress Notes (Signed)
PROGRESS NOTE    Cindy Burns  YYT:[id number] DOB: Sep 06, 1946 DOA: 01/08/2020 PCP: [name], MD   Brief Narrative:  Cindy Burns is an 74 y.o. female with a past medical history significant for breast cancer, COPD, tobacco use, diabetes, MDD was admitted April 3 with acute renal failure requiring CRRT, hyperkalemia, anion gap metabolic acidosis secondary to DKA, acute respiratory failure requiring intubation and extubated on April 6.  She has had persistent ongoing kidney failure.  Followed by nephrology and dialysis was started April 27.  She underwent renal biopsy on 02/04/2020.  Assessment & Plan:   Principal Problem:   Renal failure Active Problems:   CIGARETTE SMOKER   Hypertension   Pressure injury of skin   Encounter for central line placement   Pain and swelling of ankle, left   Hypocalcemia   Metabolic acidosis   Acute respiratory failure with hypoxia (HCC)   UTI (urinary tract infection)   Acute kidney injury secondary to severe volume depletion in the setting of DKA/UTI/metabolic acidosis.  She was on CRRT through April 6.  Dialysis started April 27.  Status post renal biopsy by IR on 02/04/2020.  Management per nephrology.  Acute respiratory failure with hypoxia in the setting of COPD and former tobacco use.  She was intubated on admission as noted above and extubated 2 days later.  Her oxygen saturation levels remained greater than 90% on room air. -Incentive spirometry -As needed nebulizers -Encourage mobilization  Hyperkalemia.  Intermittent.  Resolved.   Urinary tract infection.  Pansensitive E. coli noted on culture.  Blood cultures with no growth to date.  She has remained afebrile hemodynamically stable and nontoxic-appearing.  She completed a 7-day course of Rocephin  Normocytic anemia.  Stable.  No sign symptoms of active bleeding. -ESA aranesp every Thursday per nephrology -monitor  Diabetes type II.  Uncontrolled.  History of noncompliance.   Hemoglobin A1c 8.5. -Sliding scale for optimal control  Left foot drop/pain and swelling of left ankle.  X-ray of left ankle negative.  MRI of the lumbar spine revealed disc bulging at L3 and L4 and disc protrusion L5-S1 moderate facet hypertrophy at L5-S1.  Evaluated by neurosurgery who opined no immediate intervention needed.  Recommended outpatient follow-up and possible EMG.  She was provided with AFO/dorsiflexion bra ce  Pressure injury coccyx stage II as well as left heel deep tissue pressure injury, partial thickness loss of dermis -WOC recommendation  DVT prophylaxis: Heparin   Code Status: Full Code  Family Communication:  None present at bedside.  Plan of care discussed with patient in length and he verbalized understanding and agreed with it. Patient is from: Home Disposition Plan: Home Barriers to discharge: Continued hemodialysis, needs clip  Status is: Inpatient  Remains inpatient appropriate because:Inpatient level of care appropriate due to severity of illness   Dispo: The patient is from: Home              Anticipated d/c is to: Home              Anticipated d/c date is: Per nephrology/when clipped for HD              Patient currently is not medically stable to d/c.         Estimated body mass index is 21.55 kg/m as calculated from the following:   Height as of this encounter: 5\' 2"  (1.575 m).   Weight as of this encounter: 53.4 kg.  Pressure Injury 01/08/20 Coccyx Medial  Stage 2 -  Partial thickness loss of dermis presenting as a shallow open injury with a red, pink wound bed without slough. small area of broken skin on coccyx (Active)  01/08/20 2200  Location: Coccyx  Location Orientation: Medial  Staging: Stage 2 -  Partial thickness loss of dermis presenting as a shallow open injury with a red, pink wound bed without slough.  Wound Description (Comments): small area of broken skin on coccyx  Present on Admission: Yes     Pressure Injury 01/09/20  Heel Left Deep Tissue Pressure Injury - Purple or maroon localized area of discolored intact skin or blood-filled blister due to damage of underlying soft tissue from pressure and/or shear. purplr color with red circle around it (Active)  01/09/20 1000  Location: Heel  Location Orientation: Left  Staging: Deep Tissue Pressure Injury - Purple or maroon localized area of discolored intact skin or blood-filled blister due to damage of underlying soft tissue from pressure and/or shear.  Wound Description (Comments): purplr color with red circle around it  Present on Admission: Yes     Nutritional status:  Nutrition Problem: Inadequate oral intake Etiology: inability to eat   Signs/Symptoms: NPO status   Interventions: Tube feeding, MVI    Consultants:   Nephrology  Procedures:   Renal biopsy 02/04/2020  Antimicrobials:  Anti-infectives (From admission, onward)   Start     Dose/Rate Route Frequency Ordered Stop   02/01/20 0900  ceFAZolin (ANCEF) IVPB 2g/100 mL premix     2 g 200 mL/hr over 30 Minutes Intravenous To Radiology 02/01/20 0853 02/01/20 1815   01/08/20 2315  cefTRIAXone (ROCEPHIN) 1 g in sodium chloride 0.9 % 100 mL IVPB     1 g 200 mL/hr over 30 Minutes Intravenous Every 24 hours 01/08/20 2224 01/14/20 2313         Subjective: Patient seen and examined.  She has no complaints.  She seems to be under the impression that renal biopsy is alternative treatment for her ESRD and that now she will not need any dialysis.  I explained to her in detail that these are 2 different things.   Objective: Vitals:   02/05/20 1654 02/05/20 2050 02/06/20 0440 02/06/20 0900  BP: (!) 144/52 (!) 146/54 (!) 132/58 (!) 147/51  Pulse: 76 78 67 68  Resp: 18 18 18 18   Temp: 99.1 F (37.3 C) 98.6 F (37 C) 97.7 F (36.5 C) 98.2 F (36.8 C)  TempSrc: Oral  Oral Oral  SpO2: 98% 95% 96% 99%  Weight:      Height:        Intake/Output Summary (Last 24 hours) at 02/06/2020  1237 Last data filed at 02/06/2020 1042 Gross per 24 hour  Intake 2138.18 ml  Output 475 ml  Net 1663.18 ml   Filed Weights   02/04/20 0714 02/04/20 1030 02/05/20 0530  Weight: 54 kg 54.1 kg 53.4 kg    Examination:  General exam: Appears calm and comfortable  Respiratory system: Clear to auscultation. Respiratory effort normal. Cardiovascular system: S1 & S2 heard, RRR. No JVD, murmurs, rubs, gallops or clicks. No pedal edema. Gastrointestinal system: Abdomen is nondistended, soft and nontender. No organomegaly or masses felt. Normal bowel sounds heard. Central nervous system: Alert and oriented. No focal neurological deficits. Extremities: Symmetric 5 x 5 power. Skin: No rashes, lesions or ulcers.  Psychiatry: Judgement and insight appear poor. Mood & affect appropriate.   Data Reviewed: I have personally reviewed following labs and imaging studies  CBC:  Recent Labs  Lab 02/02/20 0432 02/03/20 0749 02/04/20 0748 02/05/20 1042  WBC 6.3 4.9 6.6 12.4*  NEUTROABS 4.6  --   --   --   HGB 7.7* 7.4* 7.5* 8.4*  HCT 24.6* 23.8* 24.4* 26.4*  MCV 95.7 96.7 97.6 96.7  PLT 238 210 203 782   Basic Metabolic Panel: Recent Labs  Lab 02/01/20 0520 02/01/20 0520 02/02/20 0432 02/03/20 0515 02/04/20 0500 02/05/20 0441 02/06/20 0852  NA 137   < > 137 136 134* 135 136  K 4.7   < > 4.1 3.6 3.7 3.9 4.2  CL 100   < > 103 101 100 100 102  CO2 24   < > 19* 24 25 23 23   GLUCOSE 101*   < > 120* 119* 121* 145* 131*  BUN 81*   < > 45* 31* 43* 24* 32*  CREATININE 6.58*   < > 4.61* 3.65* 4.87* 3.46* 4.77*  CALCIUM 7.9*   < > 8.0* 7.6* 8.1* 8.2* 8.0*  MG 1.6*  --   --  1.5*  --  1.6*  --   PHOS 5.4*   < > 4.1 3.9 4.9* 3.2 4.6   < > = values in this interval not displayed.   GFR: Estimated Creatinine Clearance: 8.6 mL/min (A) (by C-G formula based on SCr of 4.77 mg/dL (H)). Liver Function Tests: Recent Labs  Lab 02/02/20 0432 02/03/20 0515 02/04/20 0500 02/05/20 0441 02/06/20 0852   ALBUMIN 2.4* 2.2* 2.3* 2.5* 2.3*   No results for input(s): LIPASE, AMYLASE in the last 168 hours. No results for input(s): AMMONIA in the last 168 hours. Coagulation Profile: Recent Labs  Lab 02/01/20 0938 02/04/20 1112  INR 1.1 1.0   Cardiac Enzymes: No results for input(s): CKTOTAL, CKMB, CKMBINDEX, TROPONINI in the last 168 hours. BNP (last 3 results) No results for input(s): PROBNP in the last 8760 hours. HbA1C: No results for input(s): HGBA1C in the last 72 hours. CBG: Recent Labs  Lab 02/05/20 1134 02/05/20 1621 02/05/20 2050 02/06/20 0642 02/06/20 1135  GLUCAP 125* 227* 141* 110* 171*   Lipid Profile: No results for input(s): CHOL, HDL, LDLCALC, TRIG, CHOLHDL, LDLDIRECT in the last 72 hours. Thyroid Function Tests: No results for input(s): TSH, T4TOTAL, FREET4, T3FREE, THYROIDAB in the last 72 hours. Anemia Panel: No results for input(s): VITAMINB12, FOLATE, FERRITIN, TIBC, IRON, RETICCTPCT in the last 72 hours. Sepsis Labs: No results for input(s): PROCALCITON, LATICACIDVEN in the last 168 hours.  Recent Results (from the past 240 hour(s))  Culture, Urine     Status: Abnormal   Collection Time: 02/03/20  5:35 PM   Specimen: Urine, Random  Result Value Ref Range Status   Specimen Description URINE, RANDOM  Final   Special Requests   Final    NONE Performed at [facility], [address], [place], [place] [address]    Culture MULTIPLE SPECIES PRESENT, SUGGEST RECOLLECTION (A)  Final   Report Status 02/04/2020 FINAL  Final      Radiology Studies: US BIOPSY (KIDNEY)  Result Date: 02/04/2020 INDICATION: Renal failure, proteinuria and need for renal biopsy. EXAM: ULTRASOUND GUIDED CORE BIOPSY OF RIGHT KIDNEY MEDICATIONS: None. ANESTHESIA/SEDATION: Fentanyl 50 mcg IV; Versed 1.0 mg IV Moderate Sedation Time:  12 minutes. The patient was continuously monitored during the procedure by the interventional radiology nurse under my direct supervision.  PROCEDURE: The procedure, risks, benefits, and alternatives were explained to the patient. Questions regarding the procedure were encouraged and answered. The patient  understands and consents to the procedure. A time-out was performed prior to initiating the procedure. Ultrasound was performed of both kidneys in a prone position. The right flank region was prepped with chlorhexidine in a sterile fashion, and a sterile drape was applied covering the operative field. A sterile gown and sterile gloves were used for the procedure. Local anesthesia was provided with 1% Lidocaine. Under ultrasound guidance, 2 separate 16 gauge core biopsy samples were obtained of the right kidney at the level of lower pole cortex. Core biopsy samples were submitted in saline. Additional ultrasound was performed. COMPLICATIONS: None immediate. FINDINGS: Solid and intact core biopsy samples were obtained. IMPRESSION: Ultrasound-guided core biopsy performed at the level of right lower pole renal cortex. Electronically Signed   By: Aletta Edouard M.D.   On: 02/04/2020 19:04    Scheduled Meds: . amLODipine  10 mg Oral Daily  . Chlorhexidine Gluconate Cloth  6 each Topical Q0600  . darbepoetin (ARANESP) injection - NON-DIALYSIS  100 mcg Subcutaneous Q Thu-1800  . enoxaparin (LOVENOX) injection  30 mg Subcutaneous Q24H  . feeding supplement (NEPRO CARB STEADY)  237 mL Oral BID BM  . insulin aspart  0-5 Units Subcutaneous QHS  . insulin aspart  0-6 Units Subcutaneous TID WC  . magnesium oxide  400 mg Oral Daily  . mouth rinse  15 mL Mouth Rinse BID  . metoprolol tartrate  25 mg Oral BID   Continuous Infusions: . sodium chloride Stopped (01/13/20 0735)  . sodium chloride 75 mL/hr at 02/06/20 1038     LOS: 29 days   Time spent: 29 minutes   Darliss Cheney, MD [facility]  02/06/2020, 12:37 PM   To contact the attending provider between 7A-7P or the covering provider during after hours 7P-7A, please log into the web  site [email or web address].

## 2020-02-07 LAB — RENAL FUNCTION PANEL
Albumin: 2.3 g/dL — ABNORMAL LOW (ref 3.5–5.0)
Anion gap: 10 (ref 5–15)
BUN: 39 mg/dL — ABNORMAL HIGH (ref 8–23)
CO2: 20 mmol/L — ABNORMAL LOW (ref 22–32)
Calcium: 7.8 mg/dL — ABNORMAL LOW (ref 8.9–10.3)
Chloride: 106 mmol/L (ref 98–111)
Creatinine, Ser: 5.16 mg/dL — ABNORMAL HIGH (ref 0.44–1.00)
GFR calc Af Amer: 9 mL/min — ABNORMAL LOW (ref >60)
GFR calc non Af Amer: 8 mL/min — ABNORMAL LOW (ref >60)
Glucose, Bld: 108 mg/dL — ABNORMAL HIGH (ref 70–99)
Phosphorus: 5.3 mg/dL — ABNORMAL HIGH (ref 2.5–4.6)
Potassium: 4 mmol/L (ref 3.5–5.1)
Sodium: 136 mmol/L (ref 135–145)

## 2020-02-07 LAB — GLUCOSE, CAPILLARY
Glucose-Capillary: 102 mg/dL — ABNORMAL HIGH (ref 70–99)
Glucose-Capillary: 163 mg/dL — ABNORMAL HIGH (ref 70–99)
Glucose-Capillary: 112 mg/dL — ABNORMAL HIGH (ref 70–99)
Glucose-Capillary: 115 mg/dL — ABNORMAL HIGH (ref 70–99)

## 2020-02-07 LAB — MAGNESIUM: Magnesium: 2.1 mg/dL (ref 1.7–2.4)

## 2020-02-07 NOTE — Progress Notes (Signed)
Progress Note    Cindy Burns  SHF:[id number] DOB: December 13, 1945  DOA: 01/08/2020 PCP: [name], MD    Brief Narrative:     Medical records reviewed and are as summarized below:  Cindy Burns is an 74 y.o. female with a past medical history that includes breast cancer, COPD, tobacco use, diabetes, MDD, was admitted to the hospital April 3 with acute renal failure requiring CRRT, hyperkalemia, anion gap metabolic acidosis secondary to DKA, acute respiratory failure requiring intubation and extubated on April 6.  She has had persistent ongoing kidney failure.  She is followed by nephrology and dialysis was initiated on April 27.  She also underwent a renal biopsy on April 30.  Currently being evaluated on a daily basis for need for dialysis and preliminary arrangements for outpatient dialysis being discussed  Assessment/Plan:   Principal Problem:   Renal failure Active Problems:   Hypocalcemia   Metabolic acidosis   Acute respiratory failure with hypoxia ([place])   CIGARETTE SMOKER   Hypertension   UTI (urinary tract infection)   Pressure injury of skin   Encounter for central line placement   Pain and swelling of ankle, left  1.  Acute kidney injury secondary to severe volume depletion in the setting of DKA/UTI/metabolic acidosis.  She was on CRRT through April 6.  Dialysis started on April 27.  Status post renal biopsy by interventional radiology on April 30.  Management per nephrology.  Renal function is waxing and waning with creatinine trending upward today.  Tentative plan for dialysis today -Dialysis per nephrology -Further management per nephrology  #2.  Acute respiratory failure with hypoxia in the setting of COPD and former tobacco use.  She was intubated on admission as noted above and extubated 2 days later.  Her oxygen saturation levels remained greater than 90% on room air -As needed nebulizers -Mobilize station -Incentive spirometry  #3.  Hyperkalemia.   Intermittent.  Resolved  #4.  Urinary tract infection.  Pansensitive E. coli noted on culture.  Blood cultures with no growth.  She completed a 7-day course of Rocephin.  She remained afebrile hemodynamically stable and nontoxic-appearing  #5.  Normocytic anemia.  Stable -ESA aranest every Thursday per nephrology  #6.  Diabetes type 2.  Uncontrolled.  She also has a history of noncompliance.  Her hemoglobin A1c was 8.5. -Sliding scale -Appetite improved in spite of her dissatisfaction with renal diet  #7.  Left foot drop/pain and swelling of left ankle.  X-ray of left ankle negative.  MRI of the lumbar spine revealed disc bulging at L3 and L4 and disc protrusion at L5 and S1 moderate facet hypertrophy at L5-S1.  Evaluated by neurosurgery who opined no immediate intervention.  Neurosurgery did recommend outpatient follow-up and possible EMG.  She was also provided with AFO/dorsiflexion brace per PT  #8.  Pressure injury coccyx stage II.  Deep tissue pressure injury partial thickness loss of dermis -Appreciate W OC recommendations     Family Communication/Anticipated D/C date and plan/Code Status   DVT prophylaxis: heparin ordered. Code Status: Full Code.  Family Communication: patient with all questions answered Disposition Plan: Status is: Inpatient  Remains inpatient appropriate because:Inpatient level of care appropriate due to severity of illness   Dispo: The patient is from: Home              Anticipated d/c is to: Home              Anticipated d/c date is:  2 days              Patient currently is not medically stable to d/c.          Medical Consultants:    neprhology   Anti-Infectives:    Rocephin 7 days  Subjective:   Awake alert denies pain or discomfort.  Objective:    Vitals:   02/06/20 2052 02/07/20 0449 02/07/20 0500 02/07/20 0856  BP: (!) 140/55 (!) 163/63  (!) 159/56  Pulse: 70 66  67  Resp: 18 18  18   Temp: 98.2 F (36.8 C) 98 F (36.7  C)  97.9 F (36.6 C)  TempSrc: Oral Oral  Oral  SpO2: 98% 100%  98%  Weight: 53.5 kg  53.5 kg   Height:        Intake/Output Summary (Last 24 hours) at 02/07/2020 1140 Last data filed at 02/07/2020 8182 Gross per 24 hour  Intake 717 ml  Output 1075 ml  Net -358 ml   Filed Weights   02/05/20 0530 02/06/20 2052 02/07/20 0500  Weight: 53.4 kg 53.5 kg 53.5 kg    Exam: General: Awake alert no acute distress CV: Regular rate and rhythm no murmur gallop or rub Respiratory: No increased work of breathing breath sounds are clear bilaterally I hear no wheezes or crackles Abdomen: Nondistended soft positive bowel sounds throughout nontender to palpation no mass organomegaly noted Musculoskeletal: Joints without swelling/erythema full range of motion Neuro: Alert and oriented x3 speech clear facial symmetry  Data Reviewed:   I have personally reviewed following labs and imaging studies:  Labs: Labs show the following:   Basic Metabolic Panel: Recent Labs  Lab 02/01/20 0520 02/02/20 0432 02/03/20 0515 02/03/20 0515 02/04/20 0500 02/04/20 0500 02/05/20 0441 02/05/20 0441 02/06/20 0852 02/07/20 0636  NA 137   < > 136  --  134*  --  135  --  136 136  K 4.7   < > 3.6   < > 3.7   < > 3.9   < > 4.2 4.0  CL 100   < > 101  --  100  --  100  --  102 106  CO2 24   < > 24  --  25  --  23  --  23 20*  GLUCOSE 101*   < > 119*  --  121*  --  145*  --  131* 108*  BUN 81*   < > 31*  --  43*  --  24*  --  32* 39*  CREATININE 6.58*   < > 3.65*  --  4.87*  --  3.46*  --  4.77* 5.16*  CALCIUM 7.9*   < > 7.6*  --  8.1*  --  8.2*  --  8.0* 7.8*  MG 1.6*  --  1.5*  --   --   --  1.6*  --   --  2.1  PHOS 5.4*   < > 3.9  --  4.9*  --  3.2  --  4.6 5.3*   < > = values in this interval not displayed.   GFR Estimated Creatinine Clearance: 7.9 mL/min (A) (by C-G formula based on SCr of 5.16 mg/dL (H)). Liver Function Tests: Recent Labs  Lab 02/03/20 0515 02/04/20 0500 02/05/20 0441 02/06/20  0852 02/07/20 0636  ALBUMIN 2.2* 2.3* 2.5* 2.3* 2.3*   No results for input(s): LIPASE, AMYLASE in the last 168 hours. No results for input(s): AMMONIA in the last 168 hours.  Coagulation profile Recent Labs  Lab 02/01/20 0938 02/04/20 1112  INR 1.1 1.0    CBC: Recent Labs  Lab 02/02/20 0432 02/03/20 0749 02/04/20 0748 02/05/20 1042  WBC 6.3 4.9 6.6 12.4*  NEUTROABS 4.6  --   --   --   HGB 7.7* 7.4* 7.5* 8.4*  HCT 24.6* 23.8* 24.4* 26.4*  MCV 95.7 96.7 97.6 96.7  PLT 238 210 203 195   Cardiac Enzymes: No results for input(s): CKTOTAL, CKMB, CKMBINDEX, TROPONINI in the last 168 hours. BNP (last 3 results) No results for input(s): PROBNP in the last 8760 hours. CBG: Recent Labs  Lab 02/06/20 1135 02/06/20 1616 02/06/20 2054 02/07/20 0647 02/07/20 1115  GLUCAP 171* 129* 183* 102* 163*   D-Dimer: No results for input(s): DDIMER in the last 72 hours. Hgb A1c: No results for input(s): HGBA1C in the last 72 hours. Lipid Profile: No results for input(s): CHOL, HDL, LDLCALC, TRIG, CHOLHDL, LDLDIRECT in the last 72 hours. Thyroid function studies: No results for input(s): TSH, T4TOTAL, T3FREE, THYROIDAB in the last 72 hours.  Invalid input(s): FREET3 Anemia work up: No results for input(s): VITAMINB12, FOLATE, FERRITIN, TIBC, IRON, RETICCTPCT in the last 72 hours. Sepsis Labs: Recent Labs  Lab 02/02/20 0432 02/03/20 0749 02/04/20 0748 02/05/20 1042  WBC 6.3 4.9 6.6 12.4*    Microbiology Recent Results (from the past 240 hour(s))  Culture, Urine     Status: Abnormal   Collection Time: 02/03/20  5:35 PM   Specimen: Urine, Random  Result Value Ref Range Status   Specimen Description URINE, RANDOM  Final   Special Requests   Final    NONE Performed at [facility], [address], [place], [place] [address]    Culture MULTIPLE SPECIES PRESENT, SUGGEST RECOLLECTION (A)  Final   Report Status 02/04/2020 FINAL  Final    Procedures and diagnostic  studies:  No results found.  Medications:   . amLODipine  10 mg Oral Daily  . Chlorhexidine Gluconate Cloth  6 each Topical Q0600  . Chlorhexidine Gluconate Cloth  6 each Topical Q0600  . darbepoetin (ARANESP) injection - NON-DIALYSIS  100 mcg Subcutaneous Q Thu-1800  . enoxaparin (LOVENOX) injection  30 mg Subcutaneous Q24H  . feeding supplement (NEPRO CARB STEADY)  237 mL Oral BID BM  . insulin aspart  0-5 Units Subcutaneous QHS  . insulin aspart  0-6 Units Subcutaneous TID WC  . magnesium oxide  400 mg Oral Daily  . mouth rinse  15 mL Mouth Rinse BID  . metoprolol tartrate  25 mg Oral BID   Continuous Infusions: . sodium chloride Stopped (01/13/20 0735)     LOS: 30 days   Radene Gunning NP  [facility]   How to contact the [facility] Attending or Consulting provider [place] or covering provider during after hours [place], for this patient?  1. Check the care team in [facility] and look for a) attending/consulting [facility] provider listed and b) the [facility] team listed 2. Log into [email or web address] and use [place]'s universal password to access. If you do not have the password, please contact the hospital operator. 3. Locate the [facility] provider you are looking for under [facility] and page to a number that you can be directly reached. 4. If you still have difficulty reaching the provider, please page the [facility] (Director on Call) for the Hospitalists listed on amion for assistance.  02/07/2020, 11:40 AM

## 2020-02-07 NOTE — Progress Notes (Signed)
Patient ID: KLEA NALL, female   DOB: May 23, 1946, 74 y.o.   MRN: [id number] S: Feels well, and has had significant increase in UOP. O:BP (!) 159/56 (BP Location: Right Arm)   Pulse 67   Temp 97.9 F (36.6 C) (Oral)   Resp 18   Ht 5\' 2"  (1.575 m)   Wt 53.5 kg   SpO2 98%   BMI 21.57 kg/m   Intake/Output Summary (Last 24 hours) at 02/07/2020 1505 Last data filed at 02/07/2020 1309 Gross per 24 hour  Intake 597 ml  Output 975 ml  Net -378 ml   Intake/Output: I/O last 3 completed shifts: In: 2018.2 [P.O.:600; I.V.:1418.2] Out: 1100 [Urine:1100]  Intake/Output this shift:  Total I/O In: 597 [P.O.:360; NG/GT:237] Out: 450 [Urine:450] Weight change:  Gen: NAD CVS: no rub Resp: CTA Abd: +BS, soft, NT/ND Ext: no edema  Recent Labs  Lab 02/01/20 0520 02/02/20 0432 02/03/20 0515 02/04/20 0500 02/05/20 0441 02/06/20 0852 02/07/20 0636  NA 137 137 136 134* 135 136 136  K 4.7 4.1 3.6 3.7 3.9 4.2 4.0  CL 100 103 101 100 100 102 106  CO2 24 19* 24 25 23 23  20*  GLUCOSE 101* 120* 119* 121* 145* 131* 108*  BUN 81* 45* 31* 43* 24* 32* 39*  CREATININE 6.58* 4.61* 3.65* 4.87* 3.46* 4.77* 5.16*  ALBUMIN 2.3* 2.4* 2.2* 2.3* 2.5* 2.3* 2.3*  CALCIUM 7.9* 8.0* 7.6* 8.1* 8.2* 8.0* 7.8*  PHOS 5.4* 4.1 3.9 4.9* 3.2 4.6 5.3*   Liver Function Tests: Recent Labs  Lab 02/05/20 0441 02/06/20 0852 02/07/20 0636  ALBUMIN 2.5* 2.3* 2.3*   No results for input(s): LIPASE, AMYLASE in the last 168 hours. No results for input(s): AMMONIA in the last 168 hours. CBC: Recent Labs  Lab 02/02/20 0432 02/02/20 0432 02/03/20 0749 02/04/20 0748 02/05/20 1042  WBC 6.3   < > 4.9 6.6 12.4*  NEUTROABS 4.6  --   --   --   --   HGB 7.7*   < > 7.4* 7.5* 8.4*  HCT 24.6*   < > 23.8* 24.4* 26.4*  MCV 95.7  --  96.7 97.6 96.7  PLT 238   < > 210 203 195   < > = values in this interval not displayed.   Cardiac Enzymes: No results for input(s): CKTOTAL, CKMB, CKMBINDEX, TROPONINI in the last 168  hours. CBG: Recent Labs  Lab 02/06/20 1135 02/06/20 1616 02/06/20 2054 02/07/20 0647 02/07/20 1115  GLUCAP 171* 129* 183* 102* 163*    Iron Studies: No results for input(s): IRON, TIBC, TRANSFERRIN, FERRITIN in the last 72 hours. Studies/Results: No results found. [name] amLODipine  10 mg Oral Daily  . Chlorhexidine Gluconate Cloth  6 each Topical Q0600  . Chlorhexidine Gluconate Cloth  6 each Topical Q0600  . darbepoetin (ARANESP) injection - NON-DIALYSIS  100 mcg Subcutaneous Q Thu-1800  . enoxaparin (LOVENOX) injection  30 mg Subcutaneous Q24H  . feeding supplement (NEPRO CARB STEADY)  237 mL Oral BID BM  . insulin aspart  0-5 Units Subcutaneous QHS  . insulin aspart  0-6 Units Subcutaneous TID WC  . magnesium oxide  400 mg Oral Daily  . mouth rinse  15 mL Mouth Rinse BID  . metoprolol tartrate  25 mg Oral BID    BMET    Component Value Date/Time   NA 136 02/07/2020 0636   K 4.0 02/07/2020 0636   CL 106 02/07/2020 0636   CO2 20 (L) 02/07/2020 0636   GLUCOSE  108 (H) 02/07/2020 0636   BUN 39 (H) 02/07/2020 0636   CREATININE 5.16 (H) 02/07/2020 0636   CALCIUM 7.8 (L) 02/07/2020 0636   GFRNONAA 8 (L) 02/07/2020 0636   GFRAA 9 (L) 02/07/2020 0636   CBC    Component Value Date/Time   WBC 12.4 (H) 02/05/2020 1042   RBC 2.73 (L) 02/05/2020 1042   HGB 8.4 (L) 02/05/2020 1042   HCT 26.4 (L) 02/05/2020 1042   PLT 195 02/05/2020 1042   MCV 96.7 02/05/2020 1042   MCH 30.8 02/05/2020 1042   MCHC 31.8 02/05/2020 1042   RDW 18.5 (H) 02/05/2020 1042   LYMPHSABS 0.9 02/02/2020 0432   MONOABS 0.4 02/02/2020 0432   EOSABS 0.1 02/02/2020 0432   BASOSABS 0.0 02/02/2020 0432     Assessment/Plan:  1. AKI- normal Scr at baseline.  Felt to be due to ATN from hypovolemia and hemodynamic insults due to infection but also had nephrotic range proteinuria and underwent renal biopsy on 02/04/20. She was started on CRRT 4/3-4/6 then eventually transitioned to IHD on 01/31/20 due to  persistently elevated BUN/Cr and oliguria.  Her UOP has been improving over the past 48 hours but BUN/Cr have continued to slowly climb.  No evidence of uremia or hyperkalemia.  Will hold off on HD for now and follow UOP and labs. 2. Nephrotic range proteinuria- s/p renal biopsy 02/04/20.  GN workup in progress. 3. Acute hypoxic respiratory failure- due to COPD, intubated early in admission but has done well since. 4. UTI- due to E. Coli 5. DM type II- poorly controlled. 6. Left foot drop- MRI bulging disc at L3-L4 and protrusion L5-S1.  To follow up with Neurosurgery as an outpatient.   Donetta Potts, MD [employer] [phone]

## 2020-02-07 NOTE — Progress Notes (Signed)
Physical Therapy Treatment Patient Details Name: Cindy Burns MRN: [id number] DOB: 1946/07/16 Today's Date: 02/07/2020    History of Present Illness Pt is a 74 year old woman admitted on 01/08/20 to [facility] and subsequently transferred to [facility] with respiratory failure requring intubation, DKA, UTI, hyperkalemia and renal failure requiring CRRT. Extubated 01/11/20. PMH: COPD, breast cancer, Dm, MDD.    PT Comments    Continuing work on functional mobility and activity tolerance;  Walked without AFO today, and while still with foot drop and decr heel strike, noteworthy improvement -- hip hike present, but lessening; She seemed encouraged that Dr. Loletha Grayer decided to hold off on HD for today   Follow Up Recommendations  Home health PT;Supervision - Intermittent     Equipment Recommendations  3in1 (PT);Other (comment)(Rollator)    Recommendations for Other Services       Precautions / Restrictions Precautions Precautions: Fall Precaution Comments: Fall risk greatly reduced with use of RW Required Braces or Orthoses: Other Brace Other Brace: patient did not want to use AFO today    Mobility  Bed Mobility Overal bed mobility: Modified Independent             General bed mobility comments: mod I, inc time, no physical assist needed  Transfers Overall transfer level: Needs assistance Equipment used: 4-wheeled walker Transfers: Sit to/from Stand Sit to Stand: Supervision         General transfer comment: supervision to sit from standing with Rollator, no instability noted  Ambulation/Gait Ambulation/Gait assistance: Supervision Gait Distance (Feet): 350 Feet Assistive device: 4-wheeled walker Gait Pattern/deviations: Step-through pattern     General Gait Details: mild foot drop noted w/o AFO but no LOB or instability noted with Rollator. Pt ambulating in room w/o AD w/inc instability, noted 1- near LOb requiring minA to steady, furniture walks w/o AD   Stairs              Wheelchair Mobility    Modified Rankin (Stroke Patients Only)       Balance     Sitting balance-Leahy Scale: Good       Standing balance-Leahy Scale: Fair                              Cognition Arousal/Alertness: Awake/alert Behavior During Therapy: WFL for tasks assessed/performed Overall Cognitive Status: Within Functional Limits for tasks assessed(for simple mobility tasks)                                 General Comments: Showing insight into her medical status, at one point stating, "I need to remind folks to use a hat" (re: monitoring Is and Os      Exercises      General Comments        Pertinent Vitals/Pain Pain Assessment: No/denies pain    Home Living                      Prior Function            PT Goals (current goals can now be found in the care plan section) Acute Rehab PT Goals Patient Stated Goal: Home to her puppies PT Goal Formulation: With patient Time For Goal Achievement: 02/09/20 Potential to Achieve Goals: Good Progress towards PT goals: Progressing toward goals    Frequency    Min 3X/week  PT Plan Current plan remains appropriate    Co-evaluation              AM-PAC PT "6 Clicks" Mobility   Outcome Measure  Help needed turning from your back to your side while in a flat bed without using bedrails?: None Help needed moving from lying on your back to sitting on the side of a flat bed without using bedrails?: None Help needed moving to and from a bed to a chair (including a wheelchair)?: None Help needed standing up from a chair using your arms (e.g., wheelchair or bedside chair)?: None Help needed to walk in hospital room?: A Little Help needed climbing 3-5 steps with a railing? : A Little 6 Click Score: 22    End of Session Equipment Utilized During Treatment: Gait belt Activity Tolerance: Patient tolerated treatment well Patient left: in chair;with call bell/phone  within reach Nurse Communication: Mobility status PT Visit Diagnosis: Unsteadiness on feet (R26.81);Other abnormalities of gait and mobility (R26.89);Muscle weakness (generalized) (M62.81)     Time: 8372-9021 PT Time Calculation (min) (ACUTE ONLY): 18 min  Charges:  $Gait Training: 8-22 mins                     Roney Marion, [place]  Acute Rehabilitation Services Pager [phone] Office [phone]    [place] 02/07/2020, 5:20 PM

## 2020-02-08 LAB — GLUCOSE, CAPILLARY
Glucose-Capillary: 106 mg/dL — ABNORMAL HIGH (ref 70–99)
Glucose-Capillary: 154 mg/dL — ABNORMAL HIGH (ref 70–99)
Glucose-Capillary: 118 mg/dL — ABNORMAL HIGH (ref 70–99)
Glucose-Capillary: 282 mg/dL — ABNORMAL HIGH (ref 70–99)

## 2020-02-08 LAB — MAGNESIUM: Magnesium: 2.1 mg/dL (ref 1.7–2.4)

## 2020-02-08 LAB — RENAL FUNCTION PANEL
Albumin: 2.4 g/dL — ABNORMAL LOW (ref 3.5–5.0)
Anion gap: 14 (ref 5–15)
BUN: 44 mg/dL — ABNORMAL HIGH (ref 8–23)
CO2: 19 mmol/L — ABNORMAL LOW (ref 22–32)
Calcium: 8.1 mg/dL — ABNORMAL LOW (ref 8.9–10.3)
Chloride: 101 mmol/L (ref 98–111)
Creatinine, Ser: 5.37 mg/dL — ABNORMAL HIGH (ref 0.44–1.00)
GFR calc Af Amer: 9 mL/min — ABNORMAL LOW (ref >60)
GFR calc non Af Amer: 7 mL/min — ABNORMAL LOW (ref >60)
Glucose, Bld: 112 mg/dL — ABNORMAL HIGH (ref 70–99)
Phosphorus: 5.7 mg/dL — ABNORMAL HIGH (ref 2.5–4.6)
Potassium: 4 mmol/L (ref 3.5–5.1)
Sodium: 134 mmol/L — ABNORMAL LOW (ref 135–145)

## 2020-02-08 MED ORDER — NEPRO/CARBSTEADY PO LIQD
237.0000 mL | Freq: Three times a day (TID) | ORAL | Status: DC
  Administered 2020-02-09 – 2020-02-19 (×24): 237 mL via ORAL

## 2020-02-08 NOTE — Progress Notes (Signed)
Physical Therapy Treatment Patient Details Name: Cindy Burns MRN: [id number] DOB: March 22, 1946 Today's Date: 02/08/2020    History of Present Illness Pt is a 74 year old woman admitted on 01/08/20 to [facility] and subsequently transferred to [facility] with respiratory failure requring intubation, DKA, UTI, hyperkalemia and renal failure requiring CRRT. Extubated 01/11/20. PMH: COPD, breast cancer, Dm, MDD.    PT Comments    Continuing work on functional mobility and activity tolerance;  Session focused on gait and stair training; Able to ascend and descend a whole flight of steps with mingaurd assist for safety; Notable LLE weakness whic is warranting descending steps one step at a time leading with the LLE; Overall very nice progress   Follow Up Recommendations  Home health PT;Supervision - Intermittent     Equipment Recommendations  3in1 (PT);Other (comment)(Rollator)    Recommendations for Other Services       Precautions / Restrictions Precautions Precautions: Fall Precaution Comments: Fall risk greatly reduced with use of RW Required Braces or Orthoses: Other Brace Other Brace: AFO    Mobility  Bed Mobility                  Transfers                 General transfer comment: Met Ms. Innocent at her doorway  Ambulation/Gait Ambulation/Gait assistance: Supervision Gait Distance (Feet): 100 Feet Assistive device: 4-wheeled walker Gait Pattern/deviations: Step-through pattern Gait velocity: WFL   General Gait Details: mild foot drop noted w/o AFO but no LOB or instability noted with Rollator. Pt ambulating in room w/o AD w/inc instability, noted 1- near LOb requiring minA to steady, furniture walks w/o AD   Stairs Stairs: Yes Stairs assistance: Min guard(with and without physical contact) Stair Management: One rail Left;Forwards;Step to pattern;Alternating pattern(alternating ascending; step-by step descending) Number of Stairs: 12 General stair comments: Cues for  clearance of steps and safety, as well as sequence; noted incr difficulty descending when Jesicca attemtped to descend step over step; recommend she take steps descendingone step at a time, leading with her LLE   Wheelchair Mobility    Modified Rankin (Stroke Patients Only)       Balance                                            Cognition Arousal/Alertness: Awake/alert Behavior During Therapy: WFL for tasks assessed/performed Overall Cognitive Status: Within Functional Limits for tasks assessed                                        Exercises      General Comments        Pertinent Vitals/Pain Pain Assessment: No/denies pain    Home Living                      Prior Function            PT Goals (current goals can now be found in the care plan section) Acute Rehab PT Goals Patient Stated Goal: Home to her puppies PT Goal Formulation: With patient Time For Goal Achievement: 02/09/20(Anticipate we can upgrade goals next session) Potential to Achieve Goals: Good Progress towards PT goals: Progressing toward goals    Frequency    Min 3X/week  PT Plan Current plan remains appropriate    Co-evaluation              AM-PAC PT "6 Clicks" Mobility   Outcome Measure  Help needed turning from your back to your side while in a flat bed without using bedrails?: None Help needed moving from lying on your back to sitting on the side of a flat bed without using bedrails?: None Help needed moving to and from a bed to a chair (including a wheelchair)?: None Help needed standing up from a chair using your arms (e.g., wheelchair or bedside chair)?: None Help needed to walk in hospital room?: None Help needed climbing 3-5 steps with a railing? : A Little 6 Click Score: 23    End of Session Equipment Utilized During Treatment: Gait belt Activity Tolerance: Patient tolerated treatment well Patient left: in bed;with call  bell/phone within reach(sitting EOB) Nurse Communication: Mobility status PT Visit Diagnosis: Unsteadiness on feet (R26.81);Other abnormalities of gait and mobility (R26.89);Muscle weakness (generalized) (M62.81)     Time: 5686-1683 PT Time Calculation (min) (ACUTE ONLY): 10 min  Charges:  $Gait Training: 8-22 mins                     Roney Marion, [place]  Acute Rehabilitation Services Pager [phone] Office [place] 02/08/2020, 3:54 PM

## 2020-02-08 NOTE — Progress Notes (Signed)
Patient ID: Cindy Burns, female   DOB: [birth year], 74 y.o.   MRN: [id number] S: Feels well and had significant increase in UOP overnight, over 2 liters.  No nausea/anorexia. O:BP (!) 147/55 (BP Location: Right Arm)   Pulse 64   Temp 98.1 F (36.7 C) (Oral)   Resp 18   Ht 5\' 2"  (1.575 m)   Wt 53.5 kg   SpO2 97%   BMI 21.57 kg/m   Intake/Output Summary (Last 24 hours) at 02/08/2020 1115 Last data filed at 02/08/2020 0623 Gross per 24 hour  Intake 1077 ml  Output 2100 ml  Net -1023 ml   Intake/Output: I/O last 3 completed shifts: In: 957 [P.O.:720; NG/GT:237] Out: 2750 [Urine:2750]  Intake/Output this shift:  Total I/O In: 597 [P.O.:360; NG/GT:237] Out: 200 [Urine:200] Weight change:  Gen: NAD CVS: no rub Resp: cta Abd: +BS, soft, NT/ND Ext: no edema  Recent Labs  Lab 02/02/20 0432 02/03/20 0515 02/04/20 0500 02/05/20 0441 02/06/20 0852 02/07/20 0636 02/08/20 0419  NA 137 136 134* 135 136 136 134*  K 4.1 3.6 3.7 3.9 4.2 4.0 4.0  CL 103 101 100 100 102 106 101  CO2 19* 24 25 23 23  20* 19*  GLUCOSE 120* 119* 121* 145* 131* 108* 112*  BUN 45* 31* 43* 24* 32* 39* 44*  CREATININE 4.61* 3.65* 4.87* 3.46* 4.77* 5.16* 5.37*  ALBUMIN 2.4* 2.2* 2.3* 2.5* 2.3* 2.3* 2.4*  CALCIUM 8.0* 7.6* 8.1* 8.2* 8.0* 7.8* 8.1*  PHOS 4.1 3.9 4.9* 3.2 4.6 5.3* 5.7*   Liver Function Tests: Recent Labs  Lab 02/06/20 0852 02/07/20 0636 02/08/20 0419  ALBUMIN 2.3* 2.3* 2.4*   No results for input(s): LIPASE, AMYLASE in the last 168 hours. No results for input(s): AMMONIA in the last 168 hours. CBC: Recent Labs  Lab 02/02/20 0432 02/02/20 0432 02/03/20 0749 02/04/20 0748 02/05/20 1042  WBC 6.3   < > 4.9 6.6 12.4*  NEUTROABS 4.6  --   --   --   --   HGB 7.7*   < > 7.4* 7.5* 8.4*  HCT 24.6*   < > 23.8* 24.4* 26.4*  MCV 95.7  --  96.7 97.6 96.7  PLT 238   < > 210 203 195   < > = values in this interval not displayed.   Cardiac Enzymes: No results for input(s): CKTOTAL, CKMB,  CKMBINDEX, TROPONINI in the last 168 hours. CBG: Recent Labs  Lab 02/07/20 0647 02/07/20 1115 02/07/20 1644 02/07/20 2044 02/08/20 0626  GLUCAP 102* 163* 112* 115* 106*    Iron Studies: No results for input(s): IRON, TIBC, TRANSFERRIN, FERRITIN in the last 72 hours. Studies/Results: No results found. [name] amLODipine  10 mg Oral Daily  . Chlorhexidine Gluconate Cloth  6 each Topical Q0600  . Chlorhexidine Gluconate Cloth  6 each Topical Q0600  . darbepoetin (ARANESP) injection - NON-DIALYSIS  100 mcg Subcutaneous Q Thu-1800  . enoxaparin (LOVENOX) injection  30 mg Subcutaneous Q24H  . feeding supplement (NEPRO CARB STEADY)  237 mL Oral BID BM  . insulin aspart  0-5 Units Subcutaneous QHS  . insulin aspart  0-6 Units Subcutaneous TID WC  . magnesium oxide  400 mg Oral Daily  . mouth rinse  15 mL Mouth Rinse BID  . metoprolol tartrate  25 mg Oral BID    BMET    Component Value Date/Time   NA 134 (L) 02/08/2020 0419   K 4.0 02/08/2020 0419   CL 101 02/08/2020 0419   CO2  19 (L) 02/08/2020 0419   GLUCOSE 112 (H) 02/08/2020 0419   BUN 44 (H) 02/08/2020 0419   CREATININE 5.37 (H) 02/08/2020 0419   CALCIUM 8.1 (L) 02/08/2020 0419   GFRNONAA 7 (L) 02/08/2020 0419   GFRAA 9 (L) 02/08/2020 0419   CBC    Component Value Date/Time   WBC 12.4 (H) 02/05/2020 1042   RBC 2.73 (L) 02/05/2020 1042   HGB 8.4 (L) 02/05/2020 1042   HCT 26.4 (L) 02/05/2020 1042   PLT 195 02/05/2020 1042   MCV 96.7 02/05/2020 1042   MCH 30.8 02/05/2020 1042   MCHC 31.8 02/05/2020 1042   RDW 18.5 (H) 02/05/2020 1042   LYMPHSABS 0.9 02/02/2020 0432   MONOABS 0.4 02/02/2020 0432   EOSABS 0.1 02/02/2020 0432   BASOSABS 0.0 02/02/2020 0432    Assessment/Plan:  1. AKI- normal Scr at baseline.  Felt to be due to ATN from hypovolemia and hemodynamic insults due to infection but also had nephrotic range proteinuria and underwent renal biopsy on 02/04/20. She was started on CRRT 4/3-4/6 then eventually  transitioned to IHD on 01/31/20 due to persistently elevated BUN/Cr and oliguria.   1. Her UOP has significantly improved over the last 24 hours. 2. Slight increase in BUN/Cr 3. No uremic symptoms. 4. Hold off on HD for now as it appears she is starting the recovery phase of her ATN. 5. Continue to follow UOP and labs. 2. Nephrotic range proteinuria- s/p renal biopsy 02/04/20 and should have a preliminary report later today.  1. SPEP and light chains normal, ANA and ANCA still pending.   3. Acute hypoxic respiratory failure- due to COPD, intubated early in admission but has done well since. 4. UTI- due to E. Coli 5. DM type II- poorly controlled. 6. Left foot drop- MRI bulging disc at L3-L4 and protrusion L5-S1.  To follow up with Neurosurgery as an outpatient.   Donetta Potts, MD [employer] [phone]

## 2020-02-08 NOTE — Progress Notes (Signed)
Progress Note    Cindy Burns  RXV:[id number] DOB: 1946/08/31  DOA: 01/08/2020 PCP: [name], MD    Brief Narrative:    Medical records reviewed and are as summarized below:  Cindy Burns is an 74 y.o. female with a past medical history that includes breast cancer, tobacco use, COPD, diabetes, MDD was admitted to the hospital April 3 with acute renal failure requiring CRRT, hyperkalemia, anion gap metabolic acidosis secondary to DKA, acute respiratory failure requiring intubation from April 3 to April 6.  She has had persistent ongoing kidney failure.  Followed by nephrology and dialysis initiated on April 27.  She underwent a renal biopsy April 30 results pending.  Currently being evaluated on a daily basis for the need for dialysis and preliminary arrangements for outpatient dialysis being considered.  Anticipate preliminary results of biopsy later today.  Assessment/Plan:   Principal Problem:   Renal failure Active Problems:   Hypocalcemia   Metabolic acidosis   Acute respiratory failure with hypoxia (HCC)   C[employer]ET[employer] SMO[employer]   Hypertension   UTI (urinary tract infection)   Pressure injury of skin   Encounter for central line placement   Pain and swelling of ankle, left   #1.  Acute kidney injury secondary to severe volume depletion in the setting of DKA/UTI/metabolic acidosis.  She was on CRRT April 3 through April 6.  Dialysis started on April 27.  Status post renal biopsy by interventional radiology on April 30.  Management per nephrology.  Urine output improving.  Evaluated daily for need for dialysis.  Nephrology seen today and opine urine output improving so no dialysis today.  Anticipate preliminary results from biopsy later today -Per nephrology  #2.  Acute respiratory failure with hypoxia in the setting of COPD and former tobacco use.  Intubated from April 3 to April 6.  Oxygen saturation levels remained greater than 90% on room air. -Mobilize -Incentive  spirometry -As needed nebulizers  #3.  Hyperkalemia.  Intermittent.  Resolved  #4.  Urinary tract infection.  Pansensitive E. coli noted on culture.  Blood cultures with no growth.  She completed a 7-day course of Rocephin.  She remained afebrile hemodynamically stable and nontoxic-appearing  #5.  Normocytic anemia.  Stable -ESA aranest every Thursday per nephrology  #6.  Diabetes type 2.  Uncontrolled.  She also has a history of noncompliance.  Her hemoglobin A1c was 8.5. -Sliding scale -Appetite improved in spite of her dissatisfaction with renal diet  #7.  Left foot drop/pain and swelling of left ankle.  X-ray of left ankle negative.  MRI of the lumbar spine revealed disc bulging at L3 and L4 and disc protrusion at L5 and S1 moderate facet hypertrophy at L5-S1.  Evaluated by neurosurgery who opined no immediate intervention.  Neurosurgery did recommend outpatient follow-up and possible EMG.  She was also provided with AFO/dorsiflexion brace per PT  #8.  Pressure injury coccyx stage II.  Deep tissue pressure injury partial thickness loss of dermis -Appreciate W OC recommendations    Family Communication/Anticipated D/C date and plan/Code Status   DVT prophylaxis: heparin ordered. Code Status: Full Code.  Family Communication: patient and questions answered Disposition Plan: Status is: Inpatient  Remains inpatient appropriate because:Inpatient level of care appropriate due to severity of illness   Dispo: The patient is from: Home              Anticipated d/c is to: Home  Anticipated d/c date is: 2 days              Patient currently is not medically stable to d/c.          Medical Consultants:    nephrology   Anti-Infectives:    Completed 7-day course of Rocephin  Subjective:   Ambulating in room with steady gait.  Denies pain or discomfort.  Objective:    Vitals:   02/07/20 1646 02/07/20 2041 02/08/20 0442 02/08/20 0947  BP: (!)  172/64 (!) 148/54 (!) 146/53 (!) 147/55  Pulse: 68 70 66 64  Resp: 16 20 18 18   Temp: 98.3 F (36.8 C) 98 F (36.7 C) 97.6 F (36.4 C) 98.1 F (36.7 C)  TempSrc: Oral Oral Oral Oral  SpO2: 98% 96% 98% 97%  Weight:      Height:        Intake/Output Summary (Last 24 hours) at 02/08/2020 1200 Last data filed at 02/08/2020 1147 Gross per 24 hour  Intake 1077 ml  Output 2300 ml  Net -1223 ml   Filed Weights   02/05/20 0530 02/06/20 2052 02/07/20 0500  Weight: 53.4 kg 53.5 kg 53.5 kg    Exam: General: Ambulating in room with steady gait no acute distress CV: Regular rate and rhythm no murmur gallop or rub no lower extremity edema Respiratory: No increased work of breathing breath sounds distant but clear I hear no wheeze no crackles Abdomen: Soft positive bowel sounds throughout nontender to palpation no guarding or rebounding Musculoskeletal: Joints without swelling/erythema full range of motion Neuro: Awake alert oriented x3 speech clear  Data Reviewed:   I have personally reviewed following labs and imaging studies:  Labs: Labs show the following:   Basic Metabolic Panel: Recent Labs  Lab 02/03/20 0515 02/03/20 0515 02/04/20 0500 02/04/20 0500 02/05/20 0441 02/05/20 0441 02/06/20 0852 02/06/20 0852 02/07/20 0636 02/08/20 0419  NA 136   < > 134*  --  135  --  136  --  136 134*  K 3.6   < > 3.7   < > 3.9   < > 4.2   < > 4.0 4.0  CL 101   < > 100  --  100  --  102  --  106 101  CO2 24   < > 25  --  23  --  23  --  20* 19*  GLUCOSE 119*   < > 121*  --  145*  --  131*  --  108* 112*  BUN 31*   < > 43*  --  24*  --  32*  --  39* 44*  CREATININE 3.65*   < > 4.87*  --  3.46*  --  4.77*  --  5.16* 5.37*  CALCIUM 7.6*   < > 8.1*  --  8.2*  --  8.0*  --  7.8* 8.1*  MG 1.5*  --   --   --  1.6*  --   --   --  2.1 2.1  PHOS 3.9   < > 4.9*  --  3.2  --  4.6  --  5.3* 5.7*   < > = values in this interval not displayed.   GFR Estimated Creatinine Clearance: 7.6 mL/min (A)  (by C-G formula based on SCr of 5.37 mg/dL (H)). Liver Function Tests: Recent Labs  Lab 02/04/20 0500 02/05/20 0441 02/06/20 0852 02/07/20 0636 02/08/20 0419  ALBUMIN 2.3* 2.5* 2.3* 2.3* 2.4*   No results for  input(s): LIPASE, AMYLASE in the last 168 hours. No results for input(s): AMMONIA in the last 168 hours. Coagulation profile Recent Labs  Lab 02/04/20 1112  INR 1.0    CBC: Recent Labs  Lab 02/02/20 0432 02/03/20 0749 02/04/20 0748 02/05/20 1042  WBC 6.3 4.9 6.6 12.4*  NEUTROABS 4.6  --   --   --   HGB 7.7* 7.4* 7.5* 8.4*  HCT 24.6* 23.8* 24.4* 26.4*  MCV 95.7 96.7 97.6 96.7  PLT 238 210 203 195   Cardiac Enzymes: No results for input(s): CKTOTAL, CKMB, CKMBINDEX, TROPONINI in the last 168 hours. BNP (last 3 results) No results for input(s): PROBNP in the last 8760 hours. CBG: Recent Labs  Lab 02/07/20 1115 02/07/20 1644 02/07/20 2044 02/08/20 0626 02/08/20 1135  GLUCAP 163* 112* 115* 106* 154*   D-Dimer: No results for input(s): DDIMER in the last 72 hours. Hgb A1c: No results for input(s): HGBA1C in the last 72 hours. Lipid Profile: No results for input(s): CHOL, HDL, LDLCALC, TRIG, CHOLHDL, LDLDIRECT in the last 72 hours. Thyroid function studies: No results for input(s): TSH, T4TOTAL, T3FREE, THYROIDAB in the last 72 hours.  Invalid input(s): FREET3 Anemia work up: No results for input(s): VITAMINB12, FOLATE, FERRITIN, TIBC, IRON, RETICCTPCT in the last 72 hours. Sepsis Labs: Recent Labs  Lab 02/02/20 0432 02/03/20 0749 02/04/20 0748 02/05/20 1042  WBC 6.3 4.9 6.6 12.4*    Microbiology Recent Results (from the past 240 hour(s))  Culture, Urine     Status: Abnormal   Collection Time: 02/03/20  5:35 PM   Specimen: Urine, Random  Result Value Ref Range Status   Specimen Description URINE, RANDOM  Final   Special Requests   Final    NONE Performed at [facility], [address], [place], [place] [address]    Culture MULTIPLE  SPECIES PRESENT, SUGGEST RECOLLECTION (A)  Final   Report Status 02/04/2020 FINAL  Final    Procedures and diagnostic studies:  No results found.  Medications:   . amLODipine  10 mg Oral Daily  . Chlorhexidine Gluconate Cloth  6 each Topical Q0600  . Chlorhexidine Gluconate Cloth  6 each Topical Q0600  . darbepoetin (ARANESP) injection - NON-DIALYSIS  100 mcg Subcutaneous Q Thu-1800  . enoxaparin (LOVENOX) injection  30 mg Subcutaneous Q24H  . feeding supplement (NEPRO CARB STEADY)  237 mL Oral BID BM  . insulin aspart  0-5 Units Subcutaneous QHS  . insulin aspart  0-6 Units Subcutaneous TID WC  . magnesium oxide  400 mg Oral Daily  . mouth rinse  15 mL Mouth Rinse BID  . metoprolol tartrate  25 mg Oral BID   Continuous Infusions: . sodium chloride Stopped (01/13/20 0735)     LOS: 31 days   Radene Gunning NP [facility]   How to contact the [facility] Attending or Consulting provider [place] or covering provider during after hours [place], for this patient?  1. Check the care team in [facility] and look for a) attending/consulting TR[facility] provider listed and b) the [facility] team listed 2. Log into [email or web address] and use [place]'s universal password to access. If you do not have the password, please contact the hospital operator. 3. Locate the [facility] provider you are looking for under [facility] and page to a number that you can be directly reached. 4. If you still have difficulty reaching the provider, please page the [facility] (Director on Call) for the Hospitalists listed on amion for assistance.  02/08/2020, 12:00 PM

## 2020-02-08 NOTE — Progress Notes (Signed)
Nutrition Follow-up  DOCUMENTATION CODES:   Not applicable  INTERVENTION:  Nepro Shake po TID, each supplement provides 425 kcal and 19 grams protein   NUTRITION DIAGNOSIS:   Inadequate oral intake related to inability to eat as evidenced by NPO status.  Progressing, pt now on renal/carb modified diet   GOAL:   Patient will meet greater than or equal to 90% of their needs  Progressing.  MONITOR:   PO intake, Supplement acceptance, Skin  REASON FOR ASSESSMENT:   Ventilator, Consult Enteral/tube feeding initiation and management  ASSESSMENT:   74 yo female admitted with respiratory failure, DKA, UTI, AKI. Intubated on admission. PMH includes COPD, DM, breast cancer.  4/3 intubated, CRRT initiated 4/6 extubated, CRRT stopped 4/27 iHD initiated  4/30 nephrotic range proteinuria - s/p renal biopsy  Pt states her appetite is improving and that she is drinking her Nepro shakes in their entirety.   Per MD, renal biopsy results still pending.  Need for dialysis is evaluated on a daily basis.   PO Intake: 0-100% since last RD assessment (~44% average meal intake)  Admission wt: 52.9 kg Current wt: 53.5 kg  UOP: 2,334ml x24 hours I/O: +7,140.19ml since admit  Labs: Na 134 (L), Phosphorus 5.7 (H) CBGs [phone]  Medications reviewed and include: aranesp, Nepro shake BID, Novolog, Mag-ox  Diet Order:   Diet Order            Diet renal/carb modified with fluid restriction Diet-HS Snack? Nothing; Fluid restriction: 1200 mL Fluid; Room service appropriate? Yes; Fluid consistency: Thin  Diet effective now              EDUCATION NEEDS:   Not appropriate for education at this time  Skin:  Skin Assessment: Skin Integrity Issues: Skin Integrity Issues:: Stage II, Stage I, Other (Comment), Incisions DTI: L heel Stage I: coccyx Stage II: coccyx Incisions: R back Other: wound to L great toe  Last BM:  5/3  Height:   Ht Readings from Last 1 Encounters:   01/17/20 5\' 2"  (1.575 m)    Weight:   Wt Readings from Last 1 Encounters:  02/07/20 53.5 kg   BMI:  Body mass index is 21.57 kg/m.  Estimated Nutritional Needs:   Kcal:  1400-1600  Protein:  80-90 gm  Fluid:  >/= 1.5 L   Larkin Ina, MS, RD, LDN RD pager number and weekend/on-call pager number located in [place].

## 2020-02-08 NOTE — Progress Notes (Signed)
Occupational Therapy Treatment Patient Details Name: Cindy Burns MRN: [id number] DOB: [birth year] Today's Date: 02/08/2020    History of present illness Pt is a 74 year old woman admitted on 01/08/20 to [facility] and subsequently transferred to [facility] with respiratory failure requring intubation, DKA, UTI, hyperkalemia and renal failure requiring CRRT. Extubated 01/11/20. PMH: COPD, breast cancer, Dm, MDD.   OT comments  Pt presents supine in bed pleasant and agreeable to OT treatment session. Pt completing room level mobility using rollator and toileting ADL at supervision level throughout. Additional focus on discussion/education of safe transfer techniques to bathtub and use of DME for transfer/ADL completion (including use of tub transfer bench) with pt verbalizing understanding throughout. Feel POC remains appropriate at this time. Will continue to follow while acutely admitted.    Follow Up Recommendations  Home health OT;Supervision - Intermittent    Equipment Recommendations  Tub/shower bench          Precautions / Restrictions Precautions Precautions: Fall Precaution Comments: Fall risk greatly reduced with use of RW Required Braces or Orthoses: Other Brace Other Brace: AFO Restrictions Weight Bearing Restrictions: No       Mobility Bed Mobility Overal bed mobility: Modified Independent                Transfers Overall transfer level: Needs assistance Equipment used: 4-wheeled walker Transfers: Sit to/from Stand Sit to Stand: Supervision         General transfer comment: Met [name] at her doorway    Balance Overall balance assessment: Needs assistance Sitting-balance support: No upper extremity supported;Feet supported Sitting balance-Leahy Scale: Good     Standing balance support: Bilateral upper extremity supported;During functional activity;Single extremity supported Standing balance-Leahy Scale: Fair                             ADL  either performed or assessed with clinical judgement   ADL Overall ADL's : Needs assistance/impaired     Grooming: Set up;Wash/dry hands;Sitting Grooming Details (indicate cue type and reason): pt initially declining washing her hands despite questioning cues after exiting bathroom, stating due to the bandages on her arm (from blood draws, etc)                 Toilet Transfer: Supervision/safety;Ambulation;RW Toilet Transfer Details (indicate cue type and reason): using rollator Toileting- Clothing Manipulation and Hygiene: Supervision/safety;Sitting/lateral lean;Sit to/from Nurse, children's Details (indicate cue type and reason): discussed option of DME for use in shower (BSC vs tub bench), given pt lives alone and currently with increased fall risk pt verbalizing that tub bench likely safer option (OT in agreement), discussed safe transfer techniques  Functional mobility during ADLs: Supervision/safety;Rolling walker(4wheel RW)                         Cognition Arousal/Alertness: Awake/alert Behavior During Therapy: WFL for tasks assessed/performed Overall Cognitive Status: Within Functional Limits for tasks assessed                                          Exercises     Shoulder Instructions       General Comments      Pertinent Vitals/ Pain       Pain Assessment: No/denies pain  Home Living  Prior Functioning/Environment              Frequency  Min 2X/week        Progress Toward Goals  OT Goals(current goals can now be found in the care plan section)  Progress towards OT goals: Progressing toward goals  Acute Rehab OT Goals Patient Stated Goal: Home to her puppies OT Goal Formulation: With patient Time For Goal Achievement: 02/14/20 Potential to Achieve Goals: Good ADL Goals Pt Will Perform Eating: with supervision;with set-up;sitting Pt Will  Perform Grooming: with set-up;with supervision;sitting Pt Will Perform Upper Body Bathing: with supervision;with set-up;sitting Pt Will Perform Lower Body Bathing: with modified independence;sit to/from stand Pt Will Perform Upper Body Dressing: with supervision;with set-up;sitting Pt Will Perform Lower Body Dressing: with modified independence;sit to/from stand Pt Will Transfer to Toilet: with modified independence;ambulating Pt Will Perform Tub/Shower Transfer: Tub transfer;with modified independence;3 in 1;ambulating Pt/caregiver will Perform Home Exercise Program: Increased strength;Both right and left upper extremity;With Supervision Additional ADL Goal #1: Pt will independently verbalize 3 strategies to reduce risk of falls Additional ADL Goal #2: Pt will conistently follow 1 step commandsin 3/3 trials in non-distracting enviornment  Plan Discharge plan remains appropriate    Co-evaluation                 AM-PAC OT "6 Clicks" Daily Activity     Outcome Measure   Help from another person eating meals?: None Help from another person taking care of personal grooming?: A Little Help from another person toileting, which includes using toliet, bedpan, or urinal?: A Little Help from another person bathing (including washing, rinsing, drying)?: A Little Help from another person to put on and taking off regular upper body clothing?: A Little Help from another person to put on and taking off regular lower body clothing?: A Little 6 Click Score: 19    End of Session Equipment Utilized During Treatment: Rolling walker  OT Visit Diagnosis: Muscle weakness (generalized) (M62.81);Other symptoms and signs involving cognitive function;Unsteadiness on feet (R26.81)   Activity Tolerance Patient tolerated treatment well   Patient Left in bed;with call bell/phone within reach   Nurse Communication Mobility status        Time: 0932-3557 OT Time Calculation (min): 13 min  Charges: OT  General Charges $OT Visit: 1 Visit OT Treatments $Self Care/Home Management : 8-22 mins  Lou Cal, [place] Pager [phone] Office [place] 02/08/2020, 4:43 PM

## 2020-02-09 LAB — RENAL FUNCTION PANEL
Albumin: 2.4 g/dL — ABNORMAL LOW (ref 3.5–5.0)
Anion gap: 13 (ref 5–15)
BUN: 58 mg/dL — ABNORMAL HIGH (ref 8–23)
CO2: 18 mmol/L — ABNORMAL LOW (ref 22–32)
Calcium: 8.3 mg/dL — ABNORMAL LOW (ref 8.9–10.3)
Chloride: 103 mmol/L (ref 98–111)
Creatinine, Ser: 5.8 mg/dL — ABNORMAL HIGH (ref 0.44–1.00)
GFR calc Af Amer: 8 mL/min — ABNORMAL LOW (ref >60)
GFR calc non Af Amer: 7 mL/min — ABNORMAL LOW (ref >60)
Glucose, Bld: 105 mg/dL — ABNORMAL HIGH (ref 70–99)
Phosphorus: 6.7 mg/dL — ABNORMAL HIGH (ref 2.5–4.6)
Potassium: 4.1 mmol/L (ref 3.5–5.1)
Sodium: 134 mmol/L — ABNORMAL LOW (ref 135–145)

## 2020-02-09 LAB — GLUCOSE, CAPILLARY
Glucose-Capillary: 122 mg/dL — ABNORMAL HIGH (ref 70–99)
Glucose-Capillary: 107 mg/dL — ABNORMAL HIGH (ref 70–99)
Glucose-Capillary: 149 mg/dL — ABNORMAL HIGH (ref 70–99)
Glucose-Capillary: 249 mg/dL — ABNORMAL HIGH (ref 70–99)

## 2020-02-09 LAB — MAGNESIUM: Magnesium: 2.2 mg/dL (ref 1.7–2.4)

## 2020-02-09 NOTE — Progress Notes (Signed)
Patient ID: Cindy Burns, female   DOB: 02/03/1946, 74 y.o.   MRN: [id number] S: Feels well, no complaints. O:BP (!) 140/50 (BP Location: Right Wrist)   Pulse 68   Temp 97.8 F (36.6 C) (Oral)   Resp 18   Ht 5\' 2"  (1.575 m)   Wt 53.5 kg   SpO2 98%   BMI 21.57 kg/m   Intake/Output Summary (Last 24 hours) at 02/09/2020 1040 Last data filed at 02/09/2020 0900 Gross per 24 hour  Intake 720 ml  Output 1875 ml  Net -1155 ml   Intake/Output: I/O last 3 completed shifts: In: 1077 [P.O.:840; NG/GT:237] Out: 2875 [Urine:2875]  Intake/Output this shift:  Total I/O In: 240 [P.O.:240] Out: 400 [Urine:400] Weight change:  Gen: NAD CVS: no rub Resp: CTA Abd: +BS, soft, NT/ND Ext: no edema Neuro: no asterixis  Recent Labs  Lab 02/03/20 0515 02/04/20 0500 02/05/20 0441 02/06/20 0852 02/07/20 0636 02/08/20 0419 02/09/20 0508  NA 136 134* 135 136 136 134* 134*  K 3.6 3.7 3.9 4.2 4.0 4.0 4.1  CL 101 100 100 102 106 101 103  CO2 24 25 23 23  20* 19* 18*  GLUCOSE 119* 121* 145* 131* 108* 112* 105*  BUN 31* 43* 24* 32* 39* 44* 58*  CREATININE 3.65* 4.87* 3.46* 4.77* 5.16* 5.37* 5.80*  ALBUMIN 2.2* 2.3* 2.5* 2.3* 2.3* 2.4* 2.4*  CALCIUM 7.6* 8.1* 8.2* 8.0* 7.8* 8.1* 8.3*  PHOS 3.9 4.9* 3.2 4.6 5.3* 5.7* 6.7*   Liver Function Tests: Recent Labs  Lab 02/07/20 0636 02/08/20 0419 02/09/20 0508  ALBUMIN 2.3* 2.4* 2.4*   No results for input(s): LIPASE, AMYLASE in the last 168 hours. No results for input(s): AMMONIA in the last 168 hours. CBC: Recent Labs  Lab 02/03/20 0749 02/04/20 0748 02/05/20 1042  WBC 4.9 6.6 12.4*  HGB 7.4* 7.5* 8.4*  HCT 23.8* 24.4* 26.4*  MCV 96.7 97.6 96.7  PLT 210 203 195   Cardiac Enzymes: No results for input(s): CKTOTAL, CKMB, CKMBINDEX, TROPONINI in the last 168 hours. CBG: Recent Labs  Lab 02/08/20 0626 02/08/20 1135 02/08/20 1622 02/08/20 2045 02/09/20 0648  GLUCAP 106* 154* 118* 282* 122*    Iron Studies: No results for  input(s): IRON, TIBC, TRANSFERRIN, FERRITIN in the last 72 hours. Studies/Results: No results found. [name] amLODipine  10 mg Oral Daily  . Chlorhexidine Gluconate Cloth  6 each Topical Q0600  . Chlorhexidine Gluconate Cloth  6 each Topical Q0600  . darbepoetin (ARANESP) injection - NON-DIALYSIS  100 mcg Subcutaneous Q Thu-1800  . enoxaparin (LOVENOX) injection  30 mg Subcutaneous Q24H  . feeding supplement (NEPRO CARB STEADY)  237 mL Oral TID BM  . insulin aspart  0-5 Units Subcutaneous QHS  . insulin aspart  0-6 Units Subcutaneous TID WC  . magnesium oxide  400 mg Oral Daily  . mouth rinse  15 mL Mouth Rinse BID  . metoprolol tartrate  25 mg Oral BID    BMET    Component Value Date/Time   NA 134 (L) 02/09/2020 0508   K 4.1 02/09/2020 0508   CL 103 02/09/2020 0508   CO2 18 (L) 02/09/2020 0508   GLUCOSE 105 (H) 02/09/2020 0508   BUN 58 (H) 02/09/2020 0508   CREATININE 5.80 (H) 02/09/2020 0508   CALCIUM 8.3 (L) 02/09/2020 0508   GFRNONAA 7 (L) 02/09/2020 0508   GFRAA 8 (L) 02/09/2020 0508   CBC    Component Value Date/Time   WBC 12.4 (H) 02/05/2020 1042  RBC 2.73 (L) 02/05/2020 1042   HGB 8.4 (L) 02/05/2020 1042   HCT 26.4 (L) 02/05/2020 1042   PLT 195 02/05/2020 1042   MCV 96.7 02/05/2020 1042   MCH 30.8 02/05/2020 1042   MCHC 31.8 02/05/2020 1042   RDW 18.5 (H) 02/05/2020 1042   LYMPHSABS 0.9 02/02/2020 0432   MONOABS 0.4 02/02/2020 0432   EOSABS 0.1 02/02/2020 0432   BASOSABS 0.0 02/02/2020 0432    Assessment/Plan:  1. AKI- normal Scr at baseline. Felt to be due to ATN from hypovolemia and hemodynamic insults due to infection but also had nephrotic range proteinuria and underwent renal biopsy on 02/04/20. She was started on CRRT 4/3-4/6 then eventually transitioned to IHD on 01/31/20 due to persistently elevated BUN/Cr and oliguria.  1. Her UOP has significantly improved over the last 48 hours. 2. Slight increase in BUN/Cr 3. No uremic symptoms. 4. Hold off on HD  for now as it appears she is starting the recovery phase of her ATN. 5. Continue to follow UOP and labs. 2. Nephrotic range proteinuria- s/p renal biopsy 02/04/20.  Still waiting on preliminary report but hopefully will have it later today.  1. SPEP and light chains normal, ANA and ANCA still pending.   3. Acute hypoxic respiratory failure- due to COPD, intubated early in admission but has done well since. 4. UTI- due to E. Coli 5. DM type II- poorly controlled. 6. Left foot drop- MRI bulging disc at L3-L4 and protrusion L5-S1. To follow up with Neurosurgery as an outpatient.  Donetta Potts, MD [employer] [phone]

## 2020-02-09 NOTE — Progress Notes (Signed)
Progress Note    Cindy Burns  WJX:[id number] DOB: [birth year]  DOA: 01/08/2020 PCP: [name], MD    Brief Narrative:    Medical records reviewed and are as summarized below:  Cindy Burns is an 74 y.o. female with a past medical history that includes breast cancer, tobacco use, diabetes, COPD not on home oxygen, MDD admitted to the hospital April 3 with acute renal failure requiring CRRT, hyperkalemia, anion gap metabolic acidosis secondary to DKA, acute respiratory failure requiring intubation from April 3 April 6.  She has had persistent ongoing kidney failure.  Being followed by nephrology.  Dialysis initiated April 27.  She underwent a renal biopsy April 30 with results pending.  Currently being evaluated on a daily basis by nephrology for the need for dialysis.  Assessment/Plan:   Principal Problem:   Renal failure Active Problems:   Hypocalcemia   Metabolic acidosis   Acute respiratory failure with hypoxia (HCC)   CIGARETTE SMOKER   Hypertension   UTI (urinary tract infection)   Pressure injury of skin   Encounter for central line placement   Pain and swelling of ankle, left  #1.  Acute kidney injury secondary to severe volume depletion in the setting of DKA/UTI/metabolic acidosis.  Dialysis started April 27.  Status post renal biopsy by interventional radiology April 30.  Management per nephrology.  Urine output improving.  By nephrology today who opined urine output improving so no dialysis today.  Waiting biopsy results -Per nephrology  #2.  Acute respiratory failure with hypoxia in the setting of COPD and former tobacco use.  Intubated from April 3 April 6.  Her oxygen saturation levels remained greater than 90% on room air. -Incentive spirometry -As needed nebulizers  #3.  Hyperkalemia.  Resolved  #4.  Urinary tract infection.  Pansensitive E. coli noted on her culture.  Blood cultures with no growth.  She completed a 7-day course of Rocephin.  She  remains afebrile hemodynamically stable and nontoxic-appearing  #5.  Normocytic anemia.  Stable   #6.  Diabetes type 2.  Uncontrolled.  She also has a history of noncompliance.  Hemoglobin A1c 8.5. -Continue sliding scale -Carb modified diet  #7.  Left foot drop/pain and swelling of the left ankle.  X-ray left ankle negative.  MRI of the lumbar spine reveals L3-L4 bulging and protrusion at L5-S1.  Neurosurgery recommended outpatient follow-up and possible EMG.  Been provided with AVO/dorsiflexion brace  #8.  Pressure injury coccyx stage II.  Deep tissue pressure injury partial-thickness loss of dermis.  Appreciate walk input.  Patient is now ambulatory in room with steady gait   Family Communication/Anticipated D/C date and plan/Code Status   DVT prophylaxis: Heparin ordered. Code Status: Full Code.  Family Communication: Patient at bedside all questions answered Disposition Plan: Status is: Inpatient  Remains inpatient appropriate because:Inpatient level of care appropriate due to severity of illness   Dispo: The patient is from: Home              Anticipated d/c is to: Home              Anticipated d/c date is: 1 day              Patient currently is not medically stable to d/c.          Medical Consultants:    nephrology   Anti-Infectives:    None  Subjective:   Awake alert reminds me she has been in the  hospital now 32 days  Objective:    Vitals:   02/08/20 1658 02/08/20 2045 02/09/20 0548 02/09/20 0952  BP: 135/60 (!) 132/55 (!) 132/47 (!) 140/50  Pulse: 66 77 63 68  Resp: 18 18 18 18   Temp: 98 F (36.7 C) (!) 97.5 F (36.4 C) 98.7 F (37.1 C) 97.8 F (36.6 C)  TempSrc: Oral  Oral Oral  SpO2: 98% 97% 97% 98%  Weight:      Height:        Intake/Output Summary (Last 24 hours) at 02/09/2020 1325 Last data filed at 02/09/2020 0900 Gross per 24 hour  Intake 720 ml  Output 1675 ml  Net -955 ml   Filed Weights   02/05/20 0530 02/06/20 2052  02/07/20 0500  Weight: 53.4 kg 53.5 kg 53.5 kg    Exam: General: Awake alert no acute distress CV: Regular rate and rhythm no murmur gallop or rub no lower extremity edema Respiratory: No increased work of breathing breath sounds are slightly distant somewhat coarse but I hear no wheezes no crackles Abdomen: Soft positive bowel sounds throughout no guarding or rebounding Musculoskeletal joints without swelling/erythema Neuro alert and oriented x3 speech clear facial symmetry appears to have some short-term memory issues  Data Reviewed:   I have personally reviewed following labs and imaging studies:  Labs: Labs show the following:   Basic Metabolic Panel: Recent Labs  Lab 02/03/20 0515 02/04/20 0500 02/05/20 0441 02/05/20 0441 02/06/20 0852 02/06/20 0852 02/07/20 0636 02/07/20 0636 02/08/20 0419 02/09/20 0508  NA 136   < > 135  --  136  --  136  --  134* 134*  K 3.6   < > 3.9   < > 4.2   < > 4.0   < > 4.0 4.1  CL 101   < > 100  --  102  --  106  --  101 103  CO2 24   < > 23  --  23  --  20*  --  19* 18*  GLUCOSE 119*   < > 145*  --  131*  --  108*  --  112* 105*  BUN 31*   < > 24*  --  32*  --  39*  --  44* 58*  CREATININE 3.65*   < > 3.46*  --  4.77*  --  5.16*  --  5.37* 5.80*  CALCIUM 7.6*   < > 8.2*  --  8.0*  --  7.8*  --  8.1* 8.3*  MG 1.5*  --  1.6*  --   --   --  2.1  --  2.1 2.2  PHOS 3.9   < > 3.2  --  4.6  --  5.3*  --  5.7* 6.7*   < > = values in this interval not displayed.   GFR Estimated Creatinine Clearance: 7 mL/min (A) (by C-G formula based on SCr of 5.8 mg/dL (H)). Liver Function Tests: Recent Labs  Lab 02/05/20 0441 02/06/20 0852 02/07/20 0636 02/08/20 0419 02/09/20 0508  ALBUMIN 2.5* 2.3* 2.3* 2.4* 2.4*   No results for input(s): LIPASE, AMYLASE in the last 168 hours. No results for input(s): AMMONIA in the last 168 hours. Coagulation profile Recent Labs  Lab 02/04/20 1112  INR 1.0    CBC: Recent Labs  Lab 02/03/20 0749  02/04/20 0748 02/05/20 1042  WBC 4.9 6.6 12.4*  HGB 7.4* 7.5* 8.4*  HCT 23.8* 24.4* 26.4*  MCV 96.7 97.6 96.7  PLT  210 203 195   Cardiac Enzymes: No results for input(s): CKTOTAL, CKMB, CKMBINDEX, TROPONINI in the last 168 hours. BNP (last 3 results) No results for input(s): PROBNP in the last 8760 hours. CBG: Recent Labs  Lab 02/08/20 1135 02/08/20 1622 02/08/20 2045 02/09/20 0648 02/09/20 1116  GLUCAP 154* 118* 282* 122* 107*   D-Dimer: No results for input(s): DDIMER in the last 72 hours. Hgb A1c: No results for input(s): HGBA1C in the last 72 hours. Lipid Profile: No results for input(s): CHOL, HDL, LDLCALC, TRIG, CHOLHDL, LDLDIRECT in the last 72 hours. Thyroid function studies: No results for input(s): TSH, T4TOTAL, T3FREE, THYROIDAB in the last 72 hours.  Invalid input(s): FREET3 Anemia work up: No results for input(s): VITAMINB12, FOLATE, FERRITIN, TIBC, IRON, RETICCTPCT in the last 72 hours. Sepsis Labs: Recent Labs  Lab 02/03/20 0749 02/04/20 0748 02/05/20 1042  WBC 4.9 6.6 12.4*    Microbiology Recent Results (from the past 240 hour(s))  Culture, Urine     Status: Abnormal   Collection Time: 02/03/20  5:35 PM   Specimen: Urine, Random  Result Value Ref Range Status   Specimen Description URINE, RANDOM  Final   Special Requests   Final    NONE Performed at [facility], [address], [place], [place] [address]    Culture MULTIPLE SPECIES PRESENT, SUGGEST RECOLLECTION (A)  Final   Report Status 02/04/2020 FINAL  Final    Procedures and diagnostic studies:  No results found.  Medications:   . amLODipine  10 mg Oral Daily  . Chlorhexidine Gluconate Cloth  6 each Topical Q0600  . Chlorhexidine Gluconate Cloth  6 each Topical Q0600  . darbepoetin (ARANESP) injection - NON-DIALYSIS  100 mcg Subcutaneous Q Thu-1800  . enoxaparin (LOVENOX) injection  30 mg Subcutaneous Q24H  . feeding supplement (NEPRO CARB STEADY)  237 mL Oral TID BM   . insulin aspart  0-5 Units Subcutaneous QHS  . insulin aspart  0-6 Units Subcutaneous TID WC  . magnesium oxide  400 mg Oral Daily  . mouth rinse  15 mL Mouth Rinse BID  . metoprolol tartrate  25 mg Oral BID   Continuous Infusions: . sodium chloride Stopped (01/13/20 0735)     LOS: 32 days   Radene Gunning NP [facility]   How to contact the [facility] Attending or Consulting provider [place] or covering provider during after hours [place], for this patient?  1. Check the care team in [facility] and look for a) attending/consulting TRH provider listed and b) the [facility] team listed 2. Log into [email or web address] and use [place]'s universal password to access. If you do not have the password, please contact the hospital operator. 3. Locate the [facility] provider you are looking for under [facility] and page to a number that you can be directly reached. 4. If you still have difficulty reaching the provider, please page the [facility] (Director on Call) for the Hospitalists listed on amion for assistance.  02/09/2020, 1:25 PM

## 2020-02-10 LAB — GLUCOSE, CAPILLARY
Glucose-Capillary: 101 mg/dL — ABNORMAL HIGH (ref 70–99)
Glucose-Capillary: 143 mg/dL — ABNORMAL HIGH (ref 70–99)
Glucose-Capillary: 153 mg/dL — ABNORMAL HIGH (ref 70–99)
Glucose-Capillary: 205 mg/dL — ABNORMAL HIGH (ref 70–99)

## 2020-02-10 LAB — RENAL FUNCTION PANEL
Albumin: 2.4 g/dL — ABNORMAL LOW (ref 3.5–5.0)
Anion gap: 10 (ref 5–15)
BUN: 64 mg/dL — ABNORMAL HIGH (ref 8–23)
CO2: 21 mmol/L — ABNORMAL LOW (ref 22–32)
Calcium: 8.2 mg/dL — ABNORMAL LOW (ref 8.9–10.3)
Chloride: 103 mmol/L (ref 98–111)
Creatinine, Ser: 6.21 mg/dL — ABNORMAL HIGH (ref 0.44–1.00)
GFR calc Af Amer: 7 mL/min — ABNORMAL LOW (ref >60)
GFR calc non Af Amer: 6 mL/min — ABNORMAL LOW (ref >60)
Glucose, Bld: 109 mg/dL — ABNORMAL HIGH (ref 70–99)
Phosphorus: 6.7 mg/dL — ABNORMAL HIGH (ref 2.5–4.6)
Potassium: 4.7 mmol/L (ref 3.5–5.1)
Sodium: 134 mmol/L — ABNORMAL LOW (ref 135–145)

## 2020-02-10 LAB — MAGNESIUM: Magnesium: 2.2 mg/dL (ref 1.7–2.4)

## 2020-02-10 MED ORDER — SODIUM CHLORIDE 0.9 % IV SOLN: INTRAVENOUS | Status: AC

## 2020-02-10 NOTE — Progress Notes (Signed)
Nutrition Follow-up  DOCUMENTATION CODES:   Not applicable  INTERVENTION:  Continue Nepro Shake po TID, each supplement provides 425 kcal and 19 grams protein   NUTRITION DIAGNOSIS:   Inadequate oral intake related to inability to eat as evidenced by NPO status.  Progressing, pt now on renal/carb modified diet   GOAL:   Patient will meet greater than or equal to 90% of their needs  Progressing.  MONITOR:   PO intake, Supplement acceptance, Skin  REASON FOR ASSESSMENT:   Ventilator, Consult Enteral/tube feeding initiation and management  ASSESSMENT:   74 yo female admitted with respiratory failure, DKA, UTI, AKI. Intubated on admission. PMH includes COPD, DM, breast cancer.  4/3 intubated, CRRT initiated 4/6 extubated, CRRT stopped 4/27 iHD initiated  4/30 nephrotic range proteinuria - s/p renal biopsy  Pt eating lunch at time of RD visit. Pt states her appetite is alright and reports drinking her Nepro shakes (receiving TID). Per pt's request, provided education on a renal diet for when she discharges.  Per nephrology, preliminary report of renal biopsy was dense ATN and papillary necrosis, no immune mediated process.  Electron microscopy pending.  PO Intake: 0-100% x last 8 recorded meals (87.5% average intake)  UOP: 2,442ml x24 hours I/O: +4,615.25ml since admit  Labs: Na 134 (L), Phosphorus 6.7 (H), CBGs 101-249 Medications reviewed and include: Aranesp, Novolog, Mag-ox  Diet Order:   Diet Order            Diet renal/carb modified with fluid restriction Diet-HS Snack? Nothing; Fluid restriction: 1200 mL Fluid; Room service appropriate? Yes; Fluid consistency: Thin  Diet effective now              EDUCATION NEEDS:   Not appropriate for education at this time  Skin:  Skin Assessment: Skin Integrity Issues: Skin Integrity Issues:: Stage I, Stage II, Other (Comment), Incisions DTI: L heel Stage I: coccyx Stage II: coccyx Incisions: R back Other:  wound to L great toe  Last BM:  5/4  Height:   Ht Readings from Last 1 Encounters:  01/17/20 5\' 2"  (1.575 m)    Weight:   Wt Readings from Last 1 Encounters:  02/07/20 53.5 kg    BMI:  Body mass index is 21.57 kg/m.  Estimated Nutritional Needs:   Kcal:  1400-1600  Protein:  80-90 gm  Fluid:  >/= 1.5 L   Larkin Ina, MS, RD, LDN RD pager number and weekend/on-call pager number located in [place].

## 2020-02-10 NOTE — Progress Notes (Signed)
Progress Note    Cindy Burns  URK:[id number] DOB: May 30, 1946  DOA: 01/08/2020 PCP: [name], MD    Brief Narrative:    Medical records reviewed and are as summarized below:  Cindy Burns is an 74 y.o. female with a past medical history that includes breast cancer, tobacco use, diabetes, COPD not on home oxygen, MDD admitted to the hospital April 3 with acute renal failure requiring CRRT, hyperkalemia, anion gap metabolic acidosis secondary to DKA, acute respiratory failure requiring intubation from April 3 April 6.  She has had persistent ongoing kidney failure.  Being followed by nephrology.  Dialysis initiated April 27.  She underwent a renal biopsy April 30 with results pending.  Currently being evaluated on a daily basis by nephrology for the need for dialysis.  Assessment/Plan:   Principal Problem:   Renal failure Active Problems:   Hypocalcemia   Metabolic acidosis   Acute respiratory failure with hypoxia (HCC)   CIGARETTE SMOKER   Hypertension   UTI (urinary tract infection)   Pressure injury of skin   Encounter for central line placement   Pain and swelling of ankle, left  #1.  Acute kidney injury secondary to severe volume depletion in the setting of DKA/UTI/metabolic acidosis.  Dialysis started April 27.  Status post renal biopsy by interventional radiology April 30. Awaiting path report. Creatinine up today as well as BUN.   Management per nephrology.  Urine output improving.  Being evaluated daily by nephrology for hemodialysis needs and discharge readiness. Waiting biopsy results -Per nephrology  #2.  Acute respiratory failure with hypoxia in the setting of COPD and former tobacco use.  Intubated from April 3 April 6.  Her oxygen saturation levels remained greater than 90% on room air. -Incentive spirometry -As needed nebulizers -Mobilize  #3.  Hyperkalemia.  Resolved  #4.  Urinary tract infection.  Pansensitive E. coli noted on her culture.  Blood  cultures with no growth.  She completed a 7-day course of Rocephin.  She remains afebrile hemodynamically stable and nontoxic-appearing  #5.  Normocytic anemia.  Stable   #6.  Diabetes type 2.  Uncontrolled.  She also has a history of noncompliance.  Hemoglobin A1c 8.5. -Continue sliding scale -Carb modified diet  #7.  Left foot drop/pain and swelling of the left ankle.  X-ray left ankle negative.  MRI of the lumbar spine reveals L3-L4 bulging and protrusion at L5-S1.  Neurosurgery recommended outpatient follow-up and possible EMG.  Been provided with AVO/dorsiflexion brace  #8.  Pressure injury coccyx stage II.  Deep tissue pressure injury partial-thickness loss of dermis.  Appreciate walk input.  Patient is now ambulatory in room with steady gait     Family Communication/Anticipated D/C date and plan/Code Status   DVT prophylaxis: heparin ordered. Code Status: Full Code.  Family Communication: patient at bedside and all questions answered Disposition Plan: Status is: Inpatient  Remains inpatient appropriate because:Inpatient level of care appropriate due to severity of illness   Dispo: The patient is from: Home              Anticipated d/c is to: Home              Anticipated d/c date is: 2 days              Patient currently is not medically stable to d/c.          Medical Consultants:    nephrology   Anti-Infectives:    Completed  7 days rocephin  Subjective:   Sitting up in bed watching TV.  Smiling.  Denies pain or discomfort.   Objective:    Vitals:   02/09/20 2009 02/10/20 0458 02/10/20 0738 02/10/20 0957  BP: (!) 135/55 (!) 130/47 (!) 140/56 (!) 138/51  Pulse: 72 66 65 62  Resp: 18 18 18    Temp: 98.7 F (37.1 C) 97.9 F (36.6 C) (!) 97.4 F (36.3 C)   TempSrc: Oral Oral Oral   SpO2: 98% 100% 99%   Weight:      Height:        Intake/Output Summary (Last 24 hours) at 02/10/2020 1108 Last data filed at 02/10/2020 0740 Gross per 24 hour   Intake 780 ml  Output 2050 ml  Net -1270 ml   Filed Weights   02/05/20 0530 02/06/20 2052 02/07/20 0500  Weight: 53.4 kg 53.5 kg 53.5 kg    Exam: General: Awake alert no acute distress CV: Regular rate and rhythm no murmur gallop or rub no lower extremity edema Respiratory: No increased work of breathing breath sounds are slightly distant somewhat coarse but I hear no wheezes no crackles Abdomen: Soft positive bowel sounds throughout no guarding or rebounding Musculoskeletal joints without swelling/erythema Neuro alert and oriented x3 speech clear facial symmetry appears to have some short-term memory issues  Data Reviewed:   I have personally reviewed following labs and imaging studies:  Labs: Labs show the following:   Basic Metabolic Panel: Recent Labs  Lab 02/05/20 0441 02/05/20 0441 02/06/20 7494 02/06/20 4967 02/07/20 0636 02/07/20 0636 02/08/20 0419 02/08/20 0419 02/09/20 0508 02/10/20 0611  NA 135   < > 136  --  136  --  134*  --  134* 134*  K 3.9   < > 4.2   < > 4.0   < > 4.0   < > 4.1 4.7  CL 100   < > 102  --  106  --  101  --  103 103  CO2 23   < > 23  --  20*  --  19*  --  18* 21*  GLUCOSE 145*   < > 131*  --  108*  --  112*  --  105* 109*  BUN 24*   < > 32*  --  39*  --  44*  --  58* 64*  CREATININE 3.46*   < > 4.77*  --  5.16*  --  5.37*  --  5.80* 6.21*  CALCIUM 8.2*   < > 8.0*  --  7.8*  --  8.1*  --  8.3* 8.2*  MG 1.6*  --   --   --  2.1  --  2.1  --  2.2 2.2  PHOS 3.2   < > 4.6  --  5.3*  --  5.7*  --  6.7* 6.7*   < > = values in this interval not displayed.   GFR Estimated Creatinine Clearance: 6.6 mL/min (A) (by C-G formula based on SCr of 6.21 mg/dL (H)). Liver Function Tests: Recent Labs  Lab 02/06/20 5916 02/07/20 0636 02/08/20 0419 02/09/20 0508 02/10/20 0611  ALBUMIN 2.3* 2.3* 2.4* 2.4* 2.4*   No results for input(s): LIPASE, AMYLASE in the last 168 hours. No results for input(s): AMMONIA in the last 168 hours. Coagulation  profile Recent Labs  Lab 02/04/20 1112  INR 1.0    CBC: Recent Labs  Lab 02/04/20 0748 02/05/20 1042  WBC 6.6 12.4*  HGB 7.5* 8.4*  HCT 24.4* 26.4*  MCV 97.6 96.7  PLT 203 195   Cardiac Enzymes: No results for input(s): CKTOTAL, CKMB, CKMBINDEX, TROPONINI in the last 168 hours. BNP (last 3 results) No results for input(s): PROBNP in the last 8760 hours. CBG: Recent Labs  Lab 02/09/20 0648 02/09/20 1116 02/09/20 1647 02/09/20 2058 02/10/20 0640  GLUCAP 122* 107* 149* 249* 101*   D-Dimer: No results for input(s): DDIMER in the last 72 hours. Hgb A1c: No results for input(s): HGBA1C in the last 72 hours. Lipid Profile: No results for input(s): CHOL, HDL, LDLCALC, TRIG, CHOLHDL, LDLDIRECT in the last 72 hours. Thyroid function studies: No results for input(s): TSH, T4TOTAL, T3FREE, THYROIDAB in the last 72 hours.  Invalid input(s): FREET3 Anemia work up: No results for input(s): VITAMINB12, FOLATE, FERRITIN, TIBC, IRON, RETICCTPCT in the last 72 hours. Sepsis Labs: Recent Labs  Lab 02/04/20 0748 02/05/20 1042  WBC 6.6 12.4*    Microbiology Recent Results (from the past 240 hour(s))  Culture, Urine     Status: Abnormal   Collection Time: 02/03/20  5:35 PM   Specimen: Urine, Random  Result Value Ref Range Status   Specimen Description URINE, RANDOM  Final   Special Requests   Final    NONE Performed at [facility], [address], [place], [place] [address]    Culture MULTIPLE SPECIES PRESENT, SUGGEST RECOLLECTION (A)  Final   Report Status 02/04/2020 FINAL  Final    Procedures and diagnostic studies:  No results found.  Medications:   . amLODipine  10 mg Oral Daily  . Chlorhexidine Gluconate Cloth  6 each Topical Q0600  . Chlorhexidine Gluconate Cloth  6 each Topical Q0600  . darbepoetin (ARANESP) injection - NON-DIALYSIS  100 mcg Subcutaneous Q Thu-1800  . enoxaparin (LOVENOX) injection  30 mg Subcutaneous Q24H  . feeding supplement  (NEPRO CARB STEADY)  237 mL Oral TID BM  . insulin aspart  0-5 Units Subcutaneous QHS  . insulin aspart  0-6 Units Subcutaneous TID WC  . magnesium oxide  400 mg Oral Daily  . mouth rinse  15 mL Mouth Rinse BID  . metoprolol tartrate  25 mg Oral BID   Continuous Infusions: . sodium chloride Stopped (01/13/20 0735)  . sodium chloride 100 mL/hr at 02/10/20 1056     LOS: 33 days   Radene Gunning NP [facility]   How to contact the [facility] Attending or Consulting provider [place] or covering provider during after hours [place], for this patient?  1. Check the care team in [facility] and look for a) attending/consulting TRH provider listed and b) the [facility] team listed 2. Log into [email or web address] and use [place]'s universal password to access. If you do not have the password, please contact the hospital operator. 3. Locate the [facility] provider you are looking for under [facility] and page to a number that you can be directly reached. 4. If you still have difficulty reaching the provider, please page the [facility] (Director on Call) for the Hospitalists listed on amion for assistance.  02/10/2020, 11:08 AM

## 2020-02-10 NOTE — Progress Notes (Signed)
Physical Therapy Treatment Patient Details Name: Cindy Burns MRN: [id number] DOB: 1945/12/05 Today's Date: 02/10/2020    History of Present Illness Pt is a 74 year old woman admitted on 01/08/20 to [facility] and subsequently transferred to [facility] with respiratory failure requring intubation, DKA, UTI, hyperkalemia and renal failure requiring CRRT. Extubated 01/11/20. PMH: COPD, breast cancer, Dm, MDD.    PT Comments    Pt continuing to progress very well overall with functional mobility. Pt again ambulating without use of AFO, stating that the hospital socks are "too thick". Mild foot drop noted but pt compensating well. Feel that pt would benefit more from OP PT; however, unsure if pt has a reliable means of transportation upon d/c.     Follow Up Recommendations  Home health PT;Other (comment)(vs OPPT if pt has reliable transportation)     Equipment Recommendations  Other (comment)(ROLLATOR)    Recommendations for Other Services       Precautions / Restrictions Precautions Precautions: Fall Precaution Comments: Fall risk greatly reduced with use of RW Required Braces or Orthoses: Other Brace Other Brace: AFO Restrictions Weight Bearing Restrictions: No    Mobility  Bed Mobility Overal bed mobility: Modified Independent                Transfers Overall transfer level: Needs assistance Equipment used: 4-wheeled walker Transfers: Sit to/from Stand Sit to Stand: Supervision         General transfer comment: supervision for safety  Ambulation/Gait Ambulation/Gait assistance: Supervision Gait Distance (Feet): 2000 Feet Assistive device: 4-wheeled walker Gait Pattern/deviations: Step-through pattern Gait velocity: WFL   General Gait Details: mild L foot drop noted without AFO but no LOB or instability noted with Rollator; pt able to perform head turns vertically and horizontally without difficulties   Stairs             Wheelchair Mobility    Modified Rankin  (Stroke Patients Only)       Balance Overall balance assessment: Needs assistance Sitting-balance support: No upper extremity supported;Feet supported Sitting balance-Leahy Scale: Good     Standing balance support: During functional activity Standing balance-Leahy Scale: Fair               High level balance activites: Direction changes;Turns;Head turns High Level Balance Comments: supervision with rollator            Cognition Arousal/Alertness: Awake/alert Behavior During Therapy: WFL for tasks assessed/performed Overall Cognitive Status: Within Functional Limits for tasks assessed                                        Exercises      General Comments        Pertinent Vitals/Pain Pain Assessment: No/denies pain    Home Living                      Prior Function            PT Goals (current goals can now be found in the care plan section) Acute Rehab PT Goals PT Goal Formulation: With patient Time For Goal Achievement: 02/24/20(goals updated on 5/6) Potential to Achieve Goals: Good Progress towards PT goals: Progressing toward goals    Frequency    Min 3X/week      PT Plan Current plan remains appropriate    Co-evaluation  AM-PAC PT "6 Clicks" Mobility   Outcome Measure  Help needed turning from your back to your side while in a flat bed without using bedrails?: None Help needed moving from lying on your back to sitting on the side of a flat bed without using bedrails?: None Help needed moving to and from a bed to a chair (including a wheelchair)?: None Help needed standing up from a chair using your arms (e.g., wheelchair or bedside chair)?: None Help needed to walk in hospital room?: None Help needed climbing 3-5 steps with a railing? : A Little 6 Click Score: 23    End of Session Equipment Utilized During Treatment: Gait belt Activity Tolerance: Patient tolerated treatment well Patient  left: in bed;with call bell/phone within reach;Other (comment)(sitting EOB to eat lunch) Nurse Communication: Mobility status PT Visit Diagnosis: Unsteadiness on feet (R26.81);Other abnormalities of gait and mobility (R26.89);Muscle weakness (generalized) (M62.81)     Time: 1886-7737 PT Time Calculation (min) (ACUTE ONLY): 19 min  Charges:  $Gait Training: 8-22 mins                     Anastasio Champion, DPT  Acute Rehabilitation Services Pager [phone] Office [place] 02/10/2020, 12:41 PM

## 2020-02-10 NOTE — Plan of Care (Signed)
  Problem: Education: Goal: Knowledge of General Education information will improve Description: Including pain rating scale, medication(s)/side effects and non-pharmacologic comfort measures Outcome: Progressing   Problem: Coping: Goal: Level of anxiety will decrease Outcome: Progressing   Problem: Elimination: Goal: Will not experience complications related to bowel motility Outcome: Progressing   

## 2020-02-10 NOTE — Progress Notes (Signed)
Patient ID: LISHA VITALE, female   DOB: 1945-12-27, 74 y.o.   MRN: [id number] S: She feels well, no complaints.  No nausea/anorexia. O:BP (!) 138/51 (BP Location: Right Arm)   Pulse 62   Temp (!) 97.4 F (36.3 C) (Oral)   Resp 18   Ht 5\' 2"  (1.575 m)   Wt 53.5 kg   SpO2 99%   BMI 21.57 kg/m   Intake/Output Summary (Last 24 hours) at 02/10/2020 1134 Last data filed at 02/10/2020 1128 Gross per 24 hour  Intake 780 ml  Output 2250 ml  Net -1470 ml   Intake/Output: I/O last 3 completed shifts: In: 900 [P.O.:900] Out: 3150 [Urine:3150]  Intake/Output this shift:  Total I/O In: 120 [P.O.:120] Out: 200 [Urine:200] Weight change:  Gen: NAD CVS: RRR no rub Resp: cta Abd: +BS, soft, NT/ND Ext: no edema Neuro: no asterixis  Recent Labs  Lab 02/04/20 0500 02/05/20 0441 02/06/20 0852 02/07/20 0636 02/08/20 0419 02/09/20 0508 02/10/20 0611  NA 134* 135 136 136 134* 134* 134*  K 3.7 3.9 4.2 4.0 4.0 4.1 4.7  CL 100 100 102 106 101 103 103  CO2 25 23 23  20* 19* 18* 21*  GLUCOSE 121* 145* 131* 108* 112* 105* 109*  BUN 43* 24* 32* 39* 44* 58* 64*  CREATININE 4.87* 3.46* 4.77* 5.16* 5.37* 5.80* 6.21*  ALBUMIN 2.3* 2.5* 2.3* 2.3* 2.4* 2.4* 2.4*  CALCIUM 8.1* 8.2* 8.0* 7.8* 8.1* 8.3* 8.2*  PHOS 4.9* 3.2 4.6 5.3* 5.7* 6.7* 6.7*   Liver Function Tests: Recent Labs  Lab 02/08/20 0419 02/09/20 0508 02/10/20 0611  ALBUMIN 2.4* 2.4* 2.4*   No results for input(s): LIPASE, AMYLASE in the last 168 hours. No results for input(s): AMMONIA in the last 168 hours. CBC: Recent Labs  Lab 02/04/20 0748 02/05/20 1042  WBC 6.6 12.4*  HGB 7.5* 8.4*  HCT 24.4* 26.4*  MCV 97.6 96.7  PLT 203 195   Cardiac Enzymes: No results for input(s): CKTOTAL, CKMB, CKMBINDEX, TROPONINI in the last 168 hours. CBG: Recent Labs  Lab 02/09/20 1116 02/09/20 1647 02/09/20 2058 02/10/20 0640 02/10/20 1127  GLUCAP 107* 149* 249* 101* 143*    Iron Studies: No results for input(s): IRON, TIBC,  TRANSFERRIN, FERRITIN in the last 72 hours. Studies/Results: No results found. [name] amLODipine  10 mg Oral Daily  . Chlorhexidine Gluconate Cloth  6 each Topical Q0600  . Chlorhexidine Gluconate Cloth  6 each Topical Q0600  . darbepoetin (ARANESP) injection - NON-DIALYSIS  100 mcg Subcutaneous Q Thu-1800  . enoxaparin (LOVENOX) injection  30 mg Subcutaneous Q24H  . feeding supplement (NEPRO CARB STEADY)  237 mL Oral TID BM  . insulin aspart  0-5 Units Subcutaneous QHS  . insulin aspart  0-6 Units Subcutaneous TID WC  . magnesium oxide  400 mg Oral Daily  . mouth rinse  15 mL Mouth Rinse BID  . metoprolol tartrate  25 mg Oral BID    BMET    Component Value Date/Time   NA 134 (L) 02/10/2020 0611   K 4.7 02/10/2020 0611   CL 103 02/10/2020 0611   CO2 21 (L) 02/10/2020 0611   GLUCOSE 109 (H) 02/10/2020 0611   BUN 64 (H) 02/10/2020 0611   CREATININE 6.21 (H) 02/10/2020 0611   CALCIUM 8.2 (L) 02/10/2020 0611   GFRNONAA 6 (L) 02/10/2020 0611   GFRAA 7 (L) 02/10/2020 0611   CBC    Component Value Date/Time   WBC 12.4 (H) 02/05/2020 1042   RBC  2.73 (L) 02/05/2020 1042   HGB 8.4 (L) 02/05/2020 1042   HCT 26.4 (L) 02/05/2020 1042   PLT 195 02/05/2020 1042   MCV 96.7 02/05/2020 1042   MCH 30.8 02/05/2020 1042   MCHC 31.8 02/05/2020 1042   RDW 18.5 (H) 02/05/2020 1042   LYMPHSABS 0.9 02/02/2020 0432   MONOABS 0.4 02/02/2020 0432   EOSABS 0.1 02/02/2020 0432   BASOSABS 0.0 02/02/2020 0432     Assessment/Plan:  1. AKI- normal Scr at baseline. Felt to be due to ATN from hypovolemia and hemodynamic insults due to infection but also had nephrotic range proteinuria and underwent renal biopsy on 02/04/20. She was started on CRRT 4/3-4/6 then eventually transitioned to IHD on 01/31/20 due to persistently elevated BUN/Cr and oliguria.  1. Her UOP hassignificantly improved over the last 3 days, however her BUN/Cr continue to slowly rise. 2. No uremic symptoms. 3. Continue to hold off on  HD for now as it appears she is starting the recovery phase of her ATN.  Need to see improvement before we can safely remove [facility] and prepare for discharge. 4. Continue tofollow UOP and labs. 5. Will start IVF's for 6 hours today given net negative for past 2 days. 2. Nephrotic range proteinuria- s/p renal biopsy 02/04/20.  Preliminary report was dense ATN and papillary necrosis, no immune mediated process.  Electron microscopy pending. 1. SPEP and light chains normal, ANA and ANCA still pending. 3. Acute hypoxic respiratory failure- due to COPD, intubated early in admission but has done well since. 4. UTI- due to E. Coli 5. DM type II- poorly controlled. 6. Left foot drop- MRI bulging disc at L3-L4 and protrusion L5-S1. To follow up with Neurosurgery as an outpatient.  Donetta Potts, MD [employer] [phone]

## 2020-02-11 LAB — RENAL FUNCTION PANEL
Albumin: 2.7 g/dL — ABNORMAL LOW (ref 3.5–5.0)
Anion gap: 11 (ref 5–15)
BUN: 68 mg/dL — ABNORMAL HIGH (ref 8–23)
CO2: 18 mmol/L — ABNORMAL LOW (ref 22–32)
Calcium: 8.2 mg/dL — ABNORMAL LOW (ref 8.9–10.3)
Chloride: 103 mmol/L (ref 98–111)
Creatinine, Ser: 5.77 mg/dL — ABNORMAL HIGH (ref 0.44–1.00)
GFR calc Af Amer: 8 mL/min — ABNORMAL LOW (ref >60)
GFR calc non Af Amer: 7 mL/min — ABNORMAL LOW (ref >60)
Glucose, Bld: 125 mg/dL — ABNORMAL HIGH (ref 70–99)
Phosphorus: 6.5 mg/dL — ABNORMAL HIGH (ref 2.5–4.6)
Potassium: 5 mmol/L (ref 3.5–5.1)
Sodium: 132 mmol/L — ABNORMAL LOW (ref 135–145)

## 2020-02-11 LAB — GLUCOSE, CAPILLARY
Glucose-Capillary: 134 mg/dL — ABNORMAL HIGH (ref 70–99)
Glucose-Capillary: 89 mg/dL (ref 70–99)
Glucose-Capillary: 146 mg/dL — ABNORMAL HIGH (ref 70–99)
Glucose-Capillary: 154 mg/dL — ABNORMAL HIGH (ref 70–99)

## 2020-02-11 LAB — MAGNESIUM: Magnesium: 2.3 mg/dL (ref 1.7–2.4)

## 2020-02-11 NOTE — Plan of Care (Signed)
  Problem: Education: Goal: Knowledge of General Education information will improve Description Including pain rating scale, medication(s)/side effects and non-pharmacologic comfort measures Outcome: Progressing   Problem: Health Behavior/Discharge Planning: Goal: Ability to manage health-related needs will improve Outcome: Progressing   

## 2020-02-11 NOTE — Plan of Care (Signed)
°  Problem: Coping: °Goal: Level of anxiety will decrease °Outcome: Progressing °  °

## 2020-02-11 NOTE — Progress Notes (Signed)
Patient ID: Cindy Burns, female   DOB: 10/13/1945, 74 y.o.   MRN: [id number] S: Feels well, no complaints O:BP (!) 131/46   Pulse 63   Temp (!) 97.4 F (36.3 C) (Oral)   Resp 18   Ht 5\' 2"  (1.575 m)   Wt 53.5 kg   SpO2 97%   BMI 21.58 kg/m   Intake/Output Summary (Last 24 hours) at 02/11/2020 1206 Last data filed at 02/11/2020 1115 Gross per 24 hour  Intake 720 ml  Output 1800 ml  Net -1080 ml   Intake/Output: I/O last 3 completed shifts: In: 1080 [P.O.:1080] Out: 2900 [Urine:2900]  Intake/Output this shift:  Total I/O In: -  Out: 700 [Urine:700] Weight change:  Gen: NAD CVS: no rub Resp: cta Abd: +BS, soft, NT/ND Ext: no edema  Recent Labs  Lab 02/05/20 0441 02/06/20 0852 02/07/20 0636 02/08/20 0419 02/09/20 0508 02/10/20 0611 02/11/20 0748  NA 135 136 136 134* 134* 134* 132*  K 3.9 4.2 4.0 4.0 4.1 4.7 5.0  CL 100 102 106 101 103 103 103  CO2 23 23 20* 19* 18* 21* 18*  GLUCOSE 145* 131* 108* 112* 105* 109* 125*  BUN 24* 32* 39* 44* 58* 64* 68*  CREATININE 3.46* 4.77* 5.16* 5.37* 5.80* 6.21* 5.77*  ALBUMIN 2.5* 2.3* 2.3* 2.4* 2.4* 2.4* 2.7*  CALCIUM 8.2* 8.0* 7.8* 8.1* 8.3* 8.2* 8.2*  PHOS 3.2 4.6 5.3* 5.7* 6.7* 6.7* 6.5*   Liver Function Tests: Recent Labs  Lab 02/09/20 0508 02/10/20 0611 02/11/20 0748  ALBUMIN 2.4* 2.4* 2.7*   No results for input(s): LIPASE, AMYLASE in the last 168 hours. No results for input(s): AMMONIA in the last 168 hours. CBC: Recent Labs  Lab 02/05/20 1042  WBC 12.4*  HGB 8.4*  HCT 26.4*  MCV 96.7  PLT 195   Cardiac Enzymes: No results for input(s): CKTOTAL, CKMB, CKMBINDEX, TROPONINI in the last 168 hours. CBG: Recent Labs  Lab 02/10/20 1127 02/10/20 1632 02/10/20 2052 02/11/20 0648 02/11/20 1123  GLUCAP 143* 153* 205* 134* 89    Iron Studies: No results for input(s): IRON, TIBC, TRANSFERRIN, FERRITIN in the last 72 hours. Studies/Results: No results found. [name] amLODipine  10 mg Oral Daily  .  Chlorhexidine Gluconate Cloth  6 each Topical Q0600  . Chlorhexidine Gluconate Cloth  6 each Topical Q0600  . darbepoetin (ARANESP) injection - NON-DIALYSIS  100 mcg Subcutaneous Q Thu-1800  . enoxaparin (LOVENOX) injection  30 mg Subcutaneous Q24H  . feeding supplement (NEPRO CARB STEADY)  237 mL Oral TID BM  . insulin aspart  0-5 Units Subcutaneous QHS  . insulin aspart  0-6 Units Subcutaneous TID WC  . magnesium oxide  400 mg Oral Daily  . mouth rinse  15 mL Mouth Rinse BID  . metoprolol tartrate  25 mg Oral BID    BMET    Component Value Date/Time   NA 132 (L) 02/11/2020 0748   K 5.0 02/11/2020 0748   CL 103 02/11/2020 0748   CO2 18 (L) 02/11/2020 0748   GLUCOSE 125 (H) 02/11/2020 0748   BUN 68 (H) 02/11/2020 0748   CREATININE 5.77 (H) 02/11/2020 0748   CALCIUM 8.2 (L) 02/11/2020 0748   GFRNONAA 7 (L) 02/11/2020 0748   GFRAA 8 (L) 02/11/2020 0748   CBC    Component Value Date/Time   WBC 12.4 (H) 02/05/2020 1042   RBC 2.73 (L) 02/05/2020 1042   HGB 8.4 (L) 02/05/2020 1042   HCT 26.4 (L) 02/05/2020 1042  PLT 195 02/05/2020 1042   MCV 96.7 02/05/2020 1042   MCH 30.8 02/05/2020 1042   MCHC 31.8 02/05/2020 1042   RDW 18.5 (H) 02/05/2020 1042   LYMPHSABS 0.9 02/02/2020 0432   MONOABS 0.4 02/02/2020 0432   EOSABS 0.1 02/02/2020 0432   BASOSABS 0.0 02/02/2020 0432     Assessment/Plan:  1. AKI- normal Scr at baseline. Felt to be due to ATN from hypovolemia and hemodynamic insults due to infection but also had nephrotic range proteinuria and underwent renal biopsy on 02/04/20. She was started on CRRT 4/3-4/6 then eventually transitioned to IHD on 01/31/20 due to persistently elevated BUN/Cr and oliguria.  1. Her UOP hassignificantly improved over the last 4 days 2. Cr finally starting to improve off of dialysis. 3. No uremic symptoms. 4. Continue to hold off on HD for now as it appears she is starting the recovery phase of her ATN.   5. Need to see continued  improvement before we can safely remove [facility] and prepare for discharge. 6. Continue tofollow UOP and labs. 7. She received IVF's for 6 hours 02/11/20 and stressed importance of increasing po intake. as she had been negative for 2 days prior and likely cause of rising BUN/Cr. 2. Nephrotic range proteinuria- s/p renal biopsy 02/04/20. Preliminary report was dense ATN and papillary necrosis, no immune mediated process.  Electron microscopy pending. 1. SPEP and light chains normal, ANA and ANCA still pending. 3. Acute hypoxic respiratory failure- due to COPD, intubated early in admission but has done well since. 4. UTI- due to E. Coli 5. DM type II- poorly controlled. 6. Left foot drop- MRI bulging disc at L3-L4 and protrusion L5-S1. To follow up with Neurosurgery as an outpatient.  Donetta Potts, MD [employer] [phone]

## 2020-02-11 NOTE — Progress Notes (Signed)
02/11/20 1602  PT Visit Information  Last PT Received On 02/11/20  Assistance Needed +1  History of Present Illness Pt is a 74 year old woman admitted on 01/08/20 to [facility] and subsequently transferred to [facility] with respiratory failure requring intubation, DKA, UTI, hyperkalemia and renal failure requiring CRRT. Extubated 01/11/20. PMH: COPD, breast cancer, Dm, MDD.  Subjective Data  Patient Stated Goal Home to her puppies  Precautions  Precautions Fall  Required Braces or Orthoses Other Brace  Other Brace declined wearing AFO, but has in rom   Restrictions  Weight Bearing Restrictions No  Pain Assessment  Pain Assessment No/denies pain  Cognition  Arousal/Alertness Awake/alert  Behavior During Therapy WFL for tasks assessed/performed  Overall Cognitive Status Within Functional Limits for tasks assessed  Bed Mobility  Overal bed mobility Modified Independent  Transfers  Overall transfer level Needs assistance  Equipment used 4-wheeled walker  Transfers Sit to/from Stand  Sit to Stand Supervision  General transfer comment Supervision for safety.   Ambulation/Gait  Ambulation/Gait assistance Min guard;Min assist  Gait Distance (Feet) 2000 Feet  Assistive device 4-wheeled walker  Gait Pattern/deviations Step-through pattern;Decreased dorsiflexion - left  General Gait Details L foot drop during ambulation and caught toe multiple times during long distance ambulation. Occasionally caused mild LOB requiring min A. Was able to perform SLS to adjust sock. Cues for increasing step height.   Gait velocity Decreased  Balance  Overall balance assessment Needs assistance  Sitting-balance support No upper extremity supported;Feet supported  Sitting balance-Leahy Scale Good  Standing balance support No upper extremity supported  Standing balance-Leahy Scale Fair  Standing balance comment Able to maintain static standing with UE support   PT - End of Session  Equipment Utilized During Treatment  Gait belt  Activity Tolerance Patient tolerated treatment well  Patient left in bed;with call bell/phone within reach;Other (comment) (sitting EOB )  Nurse Communication Mobility status   PT - Assessment/Plan  PT Plan Current plan remains appropriate  PT Visit Diagnosis Unsteadiness on feet (R26.81);Other abnormalities of gait and mobility (R26.89);Muscle weakness (generalized) (M62.81)  PT Frequency (ACUTE ONLY) Min 3X/week  Follow Up Recommendations Home health PT;Other (comment) (vs OPPT if reliable transportation)  PT equipment Other (comment) (rollator)  AM-PAC PT "6 Clicks" Mobility Outcome Measure (Version 2)  Help needed turning from your back to your side while in a flat bed without using bedrails? 4  Help needed moving from lying on your back to sitting on the side of a flat bed without using bedrails? 4  Help needed moving to and from a bed to a chair (including a wheelchair)? 4  Help needed standing up from a chair using your arms (e.g., wheelchair or bedside chair)? 4  Help needed to walk in hospital room? 3  Help needed climbing 3-5 steps with a railing?  3  6 Click Score 22  Consider Recommendation of Discharge To: Home with no services  PT Goal Progression  Progress towards PT goals Progressing toward goals  Acute Rehab PT Goals  PT Goal Formulation With patient  Time For Goal Achievement 02/24/20  Potential to Achieve Goals Good  PT Time Calculation  PT Start Time (ACUTE ONLY) 1045  PT Stop Time (ACUTE ONLY) 1106  PT Time Calculation (min) (ACUTE ONLY) 21 min  PT General Charges  $$ ACUTE PT VISIT 1 Visit  PT Treatments  $Gait Training 8-22 mins   Pt progressing towards goals. Noted some L foot drop and would catch toe on floor.  Occasional LOB noted requiring min A for steadying. Otherwise, tolerated gait training well. Current recommendations appropriate. Will continue to follow acutely to maximize functional mobility independence and safety.   Reuel Derby, PT,  DPT  Acute Rehabilitation Services  Pager: [phone] Office: [phone]

## 2020-02-11 NOTE — Progress Notes (Signed)
Progress Note    Cindy Burns  ONG:[id number] DOB: December 31, 1945  DOA: 01/08/2020 PCP: [name], MD    Brief Narrative:    Medical records reviewed and are as summarized below:  Cindy Burns is an 74 y.o. female with a past medical history that includes breast cancer, tobacco use, diabetes, COPD not on home oxygen, MDD admitted to the hospital April 3 with acute renal failure requiring CRRT, hyperkalemia, anion gap metabolic acidosis secondary to DKA, acute respiratory failure requiring intubation from April 3 April 6. She has had persistent ongoing kidney failure. Being followed by nephrology. Dialysis initiated April 27. She underwent a renal biopsy April 30 with results pending. Currently being followed closely by nephrology and they are directing care.   Assessment/Plan:   Principal Problem:   Renal failure Active Problems:   Hypocalcemia   Metabolic acidosis   Acute respiratory failure with hypoxia (HCC)   C[employer] SMO[employer]   Hypertension   UTI (urinary tract infection)   Pressure injury of skin   Encounter for central line placement   Pain and swelling of ankle, left  #1.  Acute kidney injury secondary to severe volume depletion in the setting of DKA/UTI/metabolic acidosis.  Dialysis started April 27.  Status post renal biopsy by interventional radiology April 30.  Preliminary path report per nephrology note indicates patient entering the recovery phase of ATN.  She was provided with 6 hours of IV fluids yesterday per nephrology.  Urine output good.  Today creatinine 5.7 with a BUN of 68. -Nephrology  #2.  Acute respiratory failure with hypoxia in the setting of COPD and former tobacco use.  Intubated from April 3 April 6.  Oxygen saturation greater than 90% on room air and have been so for the last week. -Mobilize -Continue incentive spirometry -Encourage not to pick smoking back up when she gets out of here  #3.  Hyperkalemia.  Resolved  #4.  Urinary tract  infection.  Pansensitive E. coli noted on her culture.  Blood cultures with no growth.  She completed a 7-day course of Rocephin.  She remains afebrile hemodynamically stable and nontoxic-appearing  #5.  Normocytic anemia.  Stable  #6.  Diabetes type 2 uncontrolled.  I suspect she has a long history of noncompliance.  Hemoglobin A1c 8.5 -Continue sliding scale  #7.  Left foot drop/pain and swelling of the left ankle.  X-ray left ankle negative.  MRI of the lumbar spine reveals L3-4 bulging and protrusion at L5-S1.  Evaluated by neurosurgery recommended outpatient follow-up and possible EMG. AFO/dorsiflexion brace provided  #8.  Pressure injury coccyx stage II.  Deep tissue pressure injury partial-thickness.  Appreciate WOC input. She is ambualtory    Family Communication/Anticipated D/C date and plan/Code Status   DVT prophylaxis: Heparin ordered. Code Status: Full Code.  Family Communication: Patient at bedside all questions answered Disposition Plan: Status is: Inpatient  Remains inpatient appropriate because:Inpatient level of care appropriate due to severity of illness   Dispo: The patient is from: Home              Anticipated d/c is to: Home              Anticipated d/c date is: 1 day              Patient currently is not medically stable to d/c.          Medical Consultants:    nephrology   Anti-Infectives:    Completed 7  days of Rocephin  Subjective:   Lying in bed.  Awake alert smiling.  Denies any pain or discomfort.  Objective:    Vitals:   02/11/20 0510 02/11/20 0800 02/11/20 1115 02/11/20 1116  BP: 129/60 (!) 148/63 (!) 131/46   Pulse: 68 63  63  Resp: 18 18    Temp: 98.5 F (36.9 C) (!) 97.4 F (36.3 C)    TempSrc: Oral Oral    SpO2: 98% 97%    Weight:      Height:        Intake/Output Summary (Last 24 hours) at 02/11/2020 1118 Last data filed at 02/11/2020 0510 Gross per 24 hour  Intake 600 ml  Output 1300 ml  Net -700 ml   Filed  Weights   02/06/20 2052 02/07/20 0500 02/10/20 2053  Weight: 53.5 kg 53.5 kg 53.5 kg    Exam: General: Awake alert no acute distress CV: Regular rate and rhythm no murmur gallop or rub no lower extremity edema Respiratory: No increased work of breathing breath sounds are slightly distant somewhat coarse but I hear no wheezes no crackles Abdomen: Soft positive bowel sounds throughout no guarding or rebounding Musculoskeletal joints without swelling/erythema Neuro alert and oriented x3 speech clear facial symmetry appears to have some short-term memory issues  Data Reviewed:   I have personally reviewed following labs and imaging studies:  Labs: Labs show the following:   Basic Metabolic Panel: Recent Labs  Lab 02/07/20 0636 02/07/20 0636 02/08/20 0419 02/08/20 0419 02/09/20 0508 02/09/20 0508 02/10/20 0611 02/11/20 0748  NA 136  --  134*  --  134*  --  134* 132*  K 4.0   < > 4.0   < > 4.1   < > 4.7 5.0  CL 106  --  101  --  103  --  103 103  CO2 20*  --  19*  --  18*  --  21* 18*  GLUCOSE 108*  --  112*  --  105*  --  109* 125*  BUN 39*  --  44*  --  58*  --  64* 68*  CREATININE 5.16*  --  5.37*  --  5.80*  --  6.21* 5.77*  CALCIUM 7.8*  --  8.1*  --  8.3*  --  8.2* 8.2*  MG 2.1  --  2.1  --  2.2  --  2.2 2.3  PHOS 5.3*  --  5.7*  --  6.7*  --  6.7* 6.5*   < > = values in this interval not displayed.   GFR Estimated Creatinine Clearance: 7.1 mL/min (A) (by C-G formula based on SCr of 5.77 mg/dL (H)). Liver Function Tests: Recent Labs  Lab 02/07/20 0636 02/08/20 0419 02/09/20 0508 02/10/20 0611 02/11/20 0748  ALBUMIN 2.3* 2.4* 2.4* 2.4* 2.7*   No results for input(s): LIPASE, AMYLASE in the last 168 hours. No results for input(s): AMMONIA in the last 168 hours. Coagulation profile No results for input(s): INR, PROTIME in the last 168 hours.  CBC: Recent Labs  Lab 02/05/20 1042  WBC 12.4*  HGB 8.4*  HCT 26.4*  MCV 96.7  PLT 195   Cardiac Enzymes: No  results for input(s): CKTOTAL, CKMB, CKMBINDEX, TROPONINI in the last 168 hours. BNP (last 3 results) No results for input(s): PROBNP in the last 8760 hours. CBG: Recent Labs  Lab 02/10/20 0640 02/10/20 1127 02/10/20 1632 02/10/20 2052 02/11/20 0648  GLUCAP 101* 143* 153* 205* 134*   D-Dimer: No  results for input(s): DDIMER in the last 72 hours. Hgb A1c: No results for input(s): HGBA1C in the last 72 hours. Lipid Profile: No results for input(s): CHOL, HDL, LDLCALC, TRIG, CHOLHDL, LDLDIRECT in the last 72 hours. Thyroid function studies: No results for input(s): TSH, T4TOTAL, T3FREE, THYROIDAB in the last 72 hours.  Invalid input(s): FREET3 Anemia work up: No results for input(s): VITAMINB12, FOLATE, FERRITIN, TIBC, IRON, RETICCTPCT in the last 72 hours. Sepsis Labs: Recent Labs  Lab 02/05/20 1042  WBC 12.4*    Microbiology Recent Results (from the past 240 hour(s))  Culture, Urine     Status: Abnormal   Collection Time: 02/03/20  5:35 PM   Specimen: Urine, Random  Result Value Ref Range Status   Specimen Description URINE, RANDOM  Final   Special Requests   Final    NONE Performed at [facility], [address], [place], [place] [address]    Culture MULTIPLE SPECIES PRESENT, SUGGEST RECOLLECTION (A)  Final   Report Status 02/04/2020 FINAL  Final    Procedures and diagnostic studies:  No results found.  Medications:   . amLODipine  10 mg Oral Daily  . Chlorhexidine Gluconate Cloth  6 each Topical Q0600  . Chlorhexidine Gluconate Cloth  6 each Topical Q0600  . darbepoetin (ARANESP) injection - NON-DIALYSIS  100 mcg Subcutaneous Q Thu-1800  . enoxaparin (LOVENOX) injection  30 mg Subcutaneous Q24H  . feeding supplement (NEPRO CARB STEADY)  237 mL Oral TID BM  . insulin aspart  0-5 Units Subcutaneous QHS  . insulin aspart  0-6 Units Subcutaneous TID WC  . magnesium oxide  400 mg Oral Daily  . mouth rinse  15 mL Mouth Rinse BID  . metoprolol tartrate   25 mg Oral BID   Continuous Infusions: . sodium chloride Stopped (01/13/20 0735)     LOS: 34 days   Radene Gunning NP  [facility]   How to contact the [facility] Attending or Consulting provider [place] or covering provider during after hours [place], for this patient?  1. Check the care team in [facility] and look for a) attending/consulting [facility] provider listed and b) the [facility] team listed 2. Log into [email or web address] and use [place]'s universal password to access. If you do not have the password, please contact the hospital operator. 3. Locate the [facility] provider you are looking for under [facility] and page to a number that you can be directly reached. 4. If you still have difficulty reaching the provider, please page the [facility] (Director on Call) for the Hospitalists listed on amion for assistance.  02/11/2020, 11:18 AM

## 2020-02-11 NOTE — Progress Notes (Signed)
Occupational Therapy Treatment Patient Details Name: Cindy Burns MRN: [id number] DOB: 1946/01/08 Today's Date: 02/11/2020    History of present illness Pt is a 74 year old woman admitted on 01/08/20 to [facility] and subsequently transferred to [facility] with respiratory failure requring intubation, DKA, UTI, hyperkalemia and renal failure requiring CRRT. Extubated 01/11/20. PMH: COPD, breast cancer, Dm, MDD.   OT comments  Pt making good progress towards OT goals this session. Session focus on functional mobility with rollator and dynamic balance challenges via IADL tasks as precursor to higher level ADL tasks. Overall, pt requires supervision for household distance functional mobility with rollator and MIN verbal cues for pt to recall locking brakes during sit<>stand. Supervision- MOD I for toileting tasks with grab bars. Pt able to transport ADL items with rollator and stand ~ 1 min to complete dynamic balance tasks at sink with supervision. DC plan currently remains appropriate, will follow acutely per POC.    Follow Up Recommendations  Home health OT;Supervision - Intermittent    Equipment Recommendations  Tub/shower bench    Recommendations for Other Services      Precautions / Restrictions Precautions Precautions: Fall Precaution Comments: Fall risk greatly reduced with use of RW Other Brace: declined wearing AFO this session; no LOB noted during functional mobility Restrictions Weight Bearing Restrictions: No       Mobility Bed Mobility Overal bed mobility: Modified Independent Bed Mobility: Supine to Sit     Supine to sit: Modified independent (Device/Increase time)     General bed mobility comments: mod I, inc time, no physical assist needed  Transfers Overall transfer level: Needs assistance Equipment used: 4-wheeled walker Transfers: Sit to/from Stand Sit to Stand: Supervision         General transfer comment: supervision for safety    Balance Overall balance  assessment: Needs assistance Sitting-balance support: No upper extremity supported;Feet supported Sitting balance-Leahy Scale: Good Sitting balance - Comments: no LOB noted   Standing balance support: During functional activity Standing balance-Leahy Scale: Fair Standing balance comment: close supervision for dynamic tasks at sink                           ADL either performed or assessed with clinical judgement   ADL Overall ADL's : Needs assistance/impaired     Grooming: Wash/dry face;Standing;Supervision/safety               Lower Body Dressing: Supervision/safety;Sitting/lateral leans Lower Body Dressing Details (indicate cue type and reason): to pull socks up from EOB Toilet Transfer: Supervision/safety;Ambulation;Regular Toilet;Grab bars;Cueing for safety Toilet Transfer Details (indicate cue type and reason): rollator; mIN cues for safety to recall needing to lock brakes before sitting Toileting- Clothing Manipulation and Hygiene: Sit to/from stand;Modified independent Toileting - Clothing Manipulation Details (indicate cue type and reason): Modified Independent with use of grab bars      Functional mobility during ADLs: Supervision/safety;Rolling walker(rollator) General ADL Comments: pt complete room level functional mobility with rollator with supervision. Pt able to complete toileting MOD I with use of grab bars. pt able to transport ADL items via rollator and stand to fold towels ~ 1 min with no LOB as precursor to higher level ADL tasks. Education provided on fall prevention strategies to incorporate into ADLs at home such as decreasing clutter and being mindful of pets. pt reports need for neighbors or friends to assist with grocery shopping and IADLs at first. education provided on ECS related to IADL tasks  Vision       Perception     Praxis      Cognition Arousal/Alertness: Awake/alert Behavior During Therapy: WFL for tasks  assessed/performed Overall Cognitive Status: Within Functional Limits for tasks assessed                                          Exercises     Shoulder Instructions       General Comments pt declined wearing AFO this session as pt reports socks are too bulky with shoes    Pertinent Vitals/ Pain       Pain Assessment: No/denies pain  Home Living                                          Prior Functioning/Environment              Frequency  Min 2X/week        Progress Toward Goals  OT Goals(current goals can now be found in the care plan section)  Progress towards OT goals: Progressing toward goals  Acute Rehab OT Goals Patient Stated Goal: Home to her puppies OT Goal Formulation: With patient Time For Goal Achievement: 02/14/20 Potential to Achieve Goals: Good  Plan Discharge plan remains appropriate    Co-evaluation                 AM-PAC OT "6 Clicks" Daily Activity     Outcome Measure   Help from another person eating meals?: None Help from another person taking care of personal grooming?: A Little   Help from another person bathing (including washing, rinsing, drying)?: A Little Help from another person to put on and taking off regular upper body clothing?: A Little Help from another person to put on and taking off regular lower body clothing?: A Little 6 Click Score: 16    End of Session Equipment Utilized During Treatment: Gait belt;Other (comment)(rollator)  OT Visit Diagnosis: Muscle weakness (generalized) (M62.81);Other symptoms and signs involving cognitive function;Unsteadiness on feet (R26.81)   Activity Tolerance Patient tolerated treatment well   Patient Left in bed;with call bell/phone within reach;Other (comment)(sitting EOB- RN aware)   Nurse Communication Mobility status        Time: 1349-1405 OT Time Calculation (min): 16 min  Charges: OT General Charges $OT Visit: 1 Visit OT  Treatments $Self Care/Home Management : 8-22 mins  Lanier Clam., COTA/L Acute Rehabilitation Services [phone] [place] 02/11/2020, 3:03 PM

## 2020-02-12 LAB — RENAL FUNCTION PANEL
Albumin: 2.5 g/dL — ABNORMAL LOW (ref 3.5–5.0)
Anion gap: 13 (ref 5–15)
BUN: 79 mg/dL — ABNORMAL HIGH (ref 8–23)
CO2: 19 mmol/L — ABNORMAL LOW (ref 22–32)
Calcium: 8.4 mg/dL — ABNORMAL LOW (ref 8.9–10.3)
Chloride: 103 mmol/L (ref 98–111)
Creatinine, Ser: 6.09 mg/dL — ABNORMAL HIGH (ref 0.44–1.00)
GFR calc Af Amer: 7 mL/min — ABNORMAL LOW (ref >60)
GFR calc non Af Amer: 6 mL/min — ABNORMAL LOW (ref >60)
Glucose, Bld: 104 mg/dL — ABNORMAL HIGH (ref 70–99)
Phosphorus: 6.8 mg/dL — ABNORMAL HIGH (ref 2.5–4.6)
Potassium: 5.8 mmol/L — ABNORMAL HIGH (ref 3.5–5.1)
Sodium: 135 mmol/L (ref 135–145)

## 2020-02-12 LAB — CBC
HCT: 25.7 % — ABNORMAL LOW (ref 36.0–46.0)
Hemoglobin: 8.1 g/dL — ABNORMAL LOW (ref 12.0–15.0)
MCH: 31.4 pg (ref 26.0–34.0)
MCHC: 31.5 g/dL (ref 30.0–36.0)
MCV: 99.6 fL (ref 80.0–100.0)
Platelets: 191 10*3/uL (ref 150–400)
RBC: 2.58 MIL/uL — ABNORMAL LOW (ref 3.87–5.11)
RDW: 18.6 % — ABNORMAL HIGH (ref 11.5–15.5)
WBC: 5.6 10*3/uL (ref 4.0–10.5)
nRBC: 0.4 % — ABNORMAL HIGH (ref 0.0–0.2)

## 2020-02-12 LAB — GLUCOSE, CAPILLARY
Glucose-Capillary: 100 mg/dL — ABNORMAL HIGH (ref 70–99)
Glucose-Capillary: 134 mg/dL — ABNORMAL HIGH (ref 70–99)
Glucose-Capillary: 134 mg/dL — ABNORMAL HIGH (ref 70–99)
Glucose-Capillary: 178 mg/dL — ABNORMAL HIGH (ref 70–99)

## 2020-02-12 LAB — MAGNESIUM: Magnesium: 2.2 mg/dL (ref 1.7–2.4)

## 2020-02-12 MED ORDER — SODIUM CHLORIDE 0.9 % IV SOLN: INTRAVENOUS | Status: DC

## 2020-02-12 MED ORDER — SODIUM ZIRCONIUM CYCLOSILICATE 10 G PO PACK
10.0000 g | PACK | Freq: Every day | ORAL | Status: DC
  Administered 2020-02-12 – 2020-02-14 (×3): 10 g via ORAL
  Filled 2020-02-12 (×3): qty 1

## 2020-02-12 NOTE — Progress Notes (Signed)
Patient ID: Cindy Burns, female   DOB: 1946/05/31, 74 y.o.   MRN: [id number] S: Feels well but only took in 720 ml po and UOP of 1900.  No N/V/D. O:BP (!) 131/49 (BP Location: Right Arm)   Pulse 63   Temp 97.6 F (36.4 C) (Oral)   Resp 16   Ht 5\' 2"  (1.575 m)   Wt 58.1 kg   SpO2 100%   BMI 23.41 kg/m   Intake/Output Summary (Last 24 hours) at 02/12/2020 1131 Last data filed at 02/12/2020 0527 Gross per 24 hour  Intake 720 ml  Output 1900 ml  Net -1180 ml   Intake/Output: I/O last 3 completed shifts: In: 1080 [P.O.:1080] Out: 3400 [Urine:3400]  Intake/Output this shift:  No intake/output data recorded. Weight change: 4.541 kg Gen: NAD CVS: no rub Resp: CTA Abd: +BS, soft, NTND Ext: no edema  Recent Labs  Lab 02/06/20 0852 02/07/20 0636 02/08/20 0419 02/09/20 0508 02/10/20 0611 02/11/20 0748 02/12/20 0537  NA 136 136 134* 134* 134* 132* 135  K 4.2 4.0 4.0 4.1 4.7 5.0 5.8*  CL 102 106 101 103 103 103 103  CO2 23 20* 19* 18* 21* 18* 19*  GLUCOSE 131* 108* 112* 105* 109* 125* 104*  BUN 32* 39* 44* 58* 64* 68* 79*  CREATININE 4.77* 5.16* 5.37* 5.80* 6.21* 5.77* 6.09*  ALBUMIN 2.3* 2.3* 2.4* 2.4* 2.4* 2.7* 2.5*  CALCIUM 8.0* 7.8* 8.1* 8.3* 8.2* 8.2* 8.4*  PHOS 4.6 5.3* 5.7* 6.7* 6.7* 6.5* 6.8*   Liver Function Tests: Recent Labs  Lab 02/10/20 0611 02/11/20 0748 02/12/20 0537  ALBUMIN 2.4* 2.7* 2.5*   No results for input(s): LIPASE, AMYLASE in the last 168 hours. No results for input(s): AMMONIA in the last 168 hours. CBC: Recent Labs  Lab 02/12/20 0537  WBC 5.6  HGB 8.1*  HCT 25.7*  MCV 99.6  PLT 191   Cardiac Enzymes: No results for input(s): CKTOTAL, CKMB, CKMBINDEX, TROPONINI in the last 168 hours. CBG: Recent Labs  Lab 02/11/20 1123 02/11/20 1635 02/11/20 2138 02/12/20 0730 02/12/20 1124  GLUCAP 89 146* 154* 100* 134*    Iron Studies: No results for input(s): IRON, TIBC, TRANSFERRIN, FERRITIN in the last 72 hours. Studies/Results: No  results found. [name] amLODipine  10 mg Oral Daily  . Chlorhexidine Gluconate Cloth  6 each Topical Q0600  . Chlorhexidine Gluconate Cloth  6 each Topical Q0600  . darbepoetin (ARANESP) injection - NON-DIALYSIS  100 mcg Subcutaneous Q Thu-1800  . enoxaparin (LOVENOX) injection  30 mg Subcutaneous Q24H  . feeding supplement (NEPRO CARB STEADY)  237 mL Oral TID BM  . insulin aspart  0-5 Units Subcutaneous QHS  . insulin aspart  0-6 Units Subcutaneous TID WC  . magnesium oxide  400 mg Oral Daily  . mouth rinse  15 mL Mouth Rinse BID  . metoprolol tartrate  25 mg Oral BID  . sodium zirconium cyclosilicate  10 g Oral Daily    BMET    Component Value Date/Time   NA 135 02/12/2020 0537   K 5.8 (H) 02/12/2020 0537   CL 103 02/12/2020 0537   CO2 19 (L) 02/12/2020 0537   GLUCOSE 104 (H) 02/12/2020 0537   BUN 79 (H) 02/12/2020 0537   CREATININE 6.09 (H) 02/12/2020 0537   CALCIUM 8.4 (L) 02/12/2020 0537   GFRNONAA 6 (L) 02/12/2020 0537   GFRAA 7 (L) 02/12/2020 0537   CBC    Component Value Date/Time   WBC 5.6 02/12/2020 0537  RBC 2.58 (L) 02/12/2020 0537   HGB 8.1 (L) 02/12/2020 0537   HCT 25.7 (L) 02/12/2020 0537   PLT 191 02/12/2020 0537   MCV 99.6 02/12/2020 0537   MCH 31.4 02/12/2020 0537   MCHC 31.5 02/12/2020 0537   RDW 18.6 (H) 02/12/2020 0537   LYMPHSABS 0.9 02/02/2020 0432   MONOABS 0.4 02/02/2020 0432   EOSABS 0.1 02/02/2020 0432   BASOSABS 0.0 02/02/2020 0432    Assessment/Plan:  1. AKI- normal Scr at baseline. Felt to be due to ATN from hypovolemia and hemodynamic insults due to infection but also had nephrotic range proteinuria and underwent renal biopsy on 02/04/20. She was started on CRRT 4/3-4/6 then eventually transitioned to IHD on 01/31/20 due to persistently elevated BUN/Cr and oliguria.  1. Her UOP hassignificantly improved over the last4 days 2. Cr finally starting to improve off of dialysis with ivf's but now up again due to post-ATN diuresis.  Will  restart IVF's and follow. 3. Urged her to increase her po intake to more than 1 liter per day 4. No uremic symptoms. 5. Continue to hold off on HD for now as it appears she is starting the recovery phase of her ATN. 6. Need to see continued improvement before we can safely remove [facility] and prepare for discharge. 7. Continue tofollow UOP and labs. 2. Nephrotic range proteinuria- s/p renal biopsy 02/04/20.Preliminary report was dense ATN and papillary necrosis, no immune mediated process. Electron microscopy pending. 1. SPEP and light chains normal, ANA and ANCA still pending. 2. Biopsy with severe ATN and papillary necrosis.  No immune mediated GN. 3. Hyperkalemia- will dose with lokelma and resume IVF's as it is likely due to poor distal Na delivery due to volume depletion.  4. Acute hypoxic respiratory failure- due to COPD, intubated early in admission but has done well since. 5. UTI- due to E. Coli 6. DM type II- poorly controlled. 7. Left foot drop- MRI bulging disc at L3-L4 and protrusion L5-S1. To follow up with Neurosurgery as an outpatient.  Donetta Potts, MD [employer] [phone]

## 2020-02-12 NOTE — Progress Notes (Signed)
Progress Note    Cindy Burns  WUX:[id number] DOB: [birth year]  DOA: 01/08/2020 PCP: [name], MD    Brief Narrative:    Medical records reviewed and are as summarized below:  Cindy Burns is an 74 y.o. female with a past medical history that includes breast cancer, tobacco use, diabetes, COPD not on home oxygen, MDD admitted to the hospital April 3 with acute renal failure requiring CRRT, hyperkalemia, anion gap metabolic acidosis secondary to DKA, acute respiratory failure requiring intubation from April 3 April 6. She has had persistent ongoing kidney failure. Being followed by nephrology. Dialysis initiated April 27. She underwent a renal biopsy April 30 with results pending. Currently being followed closely by nephrology and they are directing care.   Assessment/Plan:   Principal Problem:   Renal failure Active Problems:   CIGARETTE SMOKER   Hypertension   Pressure injury of skin   Encounter for central line placement   Pain and swelling of ankle, left   Hypocalcemia   Metabolic acidosis   Acute respiratory failure with hypoxia (HCC)   UTI (urinary tract infection)  #1.  Acute kidney injury secondary to severe volume depletion in the setting of DKA/UTI/metabolic acidosis.  Dialysis started April 27.  Status post renal biopsy by interventional radiology April 30.  Preliminary path report per nephrology shows ATN.   -Nephrology -encouraged increased PO intake of fluids  #2.  Acute respiratory failure with hypoxia in the setting of COPD and former tobacco use.  Intubated from April 3 April 6.  Oxygen saturation greater than 90% on room air and have been so for the last week. -Mobilize -Continue incentive spirometry -Encouraged not to pick smoking back up when she gets out of here  #3.  Hyperkalemia.  Resolved  #4.  Urinary tract infection.  Pansensitive E. coli noted on her culture.  Blood cultures with no growth.  She completed a 7-day course of Rocephin.   She remains afebrile hemodynamically stable and nontoxic-appearing  #5.  Normocytic anemia.  Stable  #6.  Diabetes type 2 uncontrolled.  I suspect she has a long history of noncompliance.  Hemoglobin A1c 8.5 -Continue sliding scale  #7.  Left foot drop/pain and swelling of the left ankle.  X-ray left ankle negative.  MRI of the lumbar spine reveals L3-4 bulging and protrusion at L5-S1.  Evaluated by neurosurgery recommended outpatient follow-up and possible EMG. AFO/dorsiflexion brace provided  #8.  Pressure injury coccyx stage II.  Deep tissue pressure injury partial-thickness.  Appreciate WOC input. She is ambualtory    Family Communication/Anticipated D/C date and plan/Code Status   DVT prophylaxis: Heparin ordered. Code Status: Full Code.  Family Communication: Patient at bedside all questions answered Disposition Plan: Status is: Inpatient  Remains inpatient appropriate because:Inpatient level of care appropriate due to severity of illness   Dispo: The patient is from: Home              Anticipated d/c is to: Home              Anticipated d/c date is: when renal function becomes stable and patient is declared not to need HD              Patient currently is not medically stable to d/c.          Medical Consultants:    nephrology     Subjective:   Lying in bed.  Awake alert smiling.  Denies any pain or discomfort.  Objective:  Vitals:   02/11/20 1635 02/11/20 2031 02/12/20 0504 02/12/20 0933  BP: 140/63 (!) 138/58 (!) 124/48 (!) 131/49  Pulse: 70 68 63 63  Resp: 16 17 18 16   Temp: 97.9 F (36.6 C) 98.9 F (37.2 C) 98.7 F (37.1 C) 97.6 F (36.4 C)  TempSrc: Oral Oral Oral Oral  SpO2: 100% 100% 97% 100%  Weight:   58.1 kg   Height:        Intake/Output Summary (Last 24 hours) at 02/12/2020 1328 Last data filed at 02/12/2020 1000 Gross per 24 hour  Intake 960 ml  Output 1900 ml  Net -940 ml   Filed Weights   02/07/20 0500 02/10/20 2053  02/12/20 0504  Weight: 53.5 kg 53.5 kg 58.1 kg    Exam: In bed, NAD rrr No edema  Data Reviewed:   I have personally reviewed following labs and imaging studies:  Labs: Labs show the following:   Basic Metabolic Panel: Recent Labs  Lab 02/08/20 0419 02/08/20 0419 02/09/20 0508 02/09/20 0508 02/10/20 0611 02/10/20 0611 02/11/20 0748 02/12/20 0537  NA 134*  --  134*  --  134*  --  132* 135  K 4.0   < > 4.1   < > 4.7   < > 5.0 5.8*  CL 101  --  103  --  103  --  103 103  CO2 19*  --  18*  --  21*  --  18* 19*  GLUCOSE 112*  --  105*  --  109*  --  125* 104*  BUN 44*  --  58*  --  64*  --  68* 79*  CREATININE 5.37*  --  5.80*  --  6.21*  --  5.77* 6.09*  CALCIUM 8.1*  --  8.3*  --  8.2*  --  8.2* 8.4*  MG 2.1  --  2.2  --  2.2  --  2.3 2.2  PHOS 5.7*  --  6.7*  --  6.7*  --  6.5* 6.8*   < > = values in this interval not displayed.   GFR Estimated Creatinine Clearance: 6.7 mL/min (A) (by C-G formula based on SCr of 6.09 mg/dL (H)). Liver Function Tests: Recent Labs  Lab 02/08/20 0419 02/09/20 0508 02/10/20 0611 02/11/20 0748 02/12/20 0537  ALBUMIN 2.4* 2.4* 2.4* 2.7* 2.5*   No results for input(s): LIPASE, AMYLASE in the last 168 hours. No results for input(s): AMMONIA in the last 168 hours. Coagulation profile No results for input(s): INR, PROTIME in the last 168 hours.  CBC: Recent Labs  Lab 02/12/20 0537  WBC 5.6  HGB 8.1*  HCT 25.7*  MCV 99.6  PLT 191   Cardiac Enzymes: No results for input(s): CKTOTAL, CKMB, CKMBINDEX, TROPONINI in the last 168 hours. BNP (last 3 results) No results for input(s): PROBNP in the last 8760 hours. CBG: Recent Labs  Lab 02/11/20 1123 02/11/20 1635 02/11/20 2138 02/12/20 0730 02/12/20 1124  GLUCAP 89 146* 154* 100* 134*   D-Dimer: No results for input(s): DDIMER in the last 72 hours. Hgb A1c: No results for input(s): HGBA1C in the last 72 hours. Lipid Profile: No results for input(s): CHOL, HDL, LDLCALC,  TRIG, CHOLHDL, LDLDIRECT in the last 72 hours. Thyroid function studies: No results for input(s): TSH, T4TOTAL, T3FREE, THYROIDAB in the last 72 hours.  Invalid input(s): FREET3 Anemia work up: No results for input(s): VITAMINB12, FOLATE, FERRITIN, TIBC, IRON, RETICCTPCT in the last 72 hours. Sepsis Labs: Recent Labs  Lab  02/12/20 0537  WBC 5.6    Microbiology Recent Results (from the past 240 hour(s))  Culture, Urine     Status: Abnormal   Collection Time: 02/03/20  5:35 PM   Specimen: Urine, Random  Result Value Ref Range Status   Specimen Description URINE, RANDOM  Final   Special Requests   Final    NONE Performed at [facility], [address], [place],  [address]    Culture MULTIPLE SPECIES PRESENT, SUGGEST RECOLLECTION (A)  Final   Report Status 02/04/2020 FINAL  Final    Procedures and diagnostic studies:  No results found.  Medications:   . amLODipine  10 mg Oral Daily  . Chlorhexidine Gluconate Cloth  6 each Topical Q0600  . Chlorhexidine Gluconate Cloth  6 each Topical Q0600  . darbepoetin (ARANESP) injection - NON-DIALYSIS  100 mcg Subcutaneous Q Thu-1800  . enoxaparin (LOVENOX) injection  30 mg Subcutaneous Q24H  . feeding supplement (NEPRO CARB STEADY)  237 mL Oral TID BM  . insulin aspart  0-5 Units Subcutaneous QHS  . insulin aspart  0-6 Units Subcutaneous TID WC  . magnesium oxide  400 mg Oral Daily  . mouth rinse  15 mL Mouth Rinse BID  . metoprolol tartrate  25 mg Oral BID  . sodium zirconium cyclosilicate  10 g Oral Daily   Continuous Infusions: . sodium chloride Stopped (01/13/20 0735)  . sodium chloride 100 mL/hr at 02/12/20 0929     LOS: 35 days   [facility] Hospitalists   How to contact the [facility] Attending or Consulting provider [place] or covering provider during after hours [place], for this patient?  1. Check the care team in [facility] and look for a) attending/consulting TRH provider listed and b) the [facility]  team listed 2. Log into [email or web address] and use [place]'s universal password to access. If you do not have the password, please contact the hospital operator. 3. Locate the [facility] provider you are looking for under [facility] and page to a number that you can be directly reached. 4. If you still have difficulty reaching the provider, please page the [facility] (Director on Call) for the Hospitalists listed on amion for assistance.  02/12/2020, 1:28 PM

## 2020-02-13 LAB — URINALYSIS, COMPLETE (UACMP) WITH MICROSCOPIC
Bilirubin Urine: NEGATIVE
Glucose, UA: NEGATIVE mg/dL
Hgb urine dipstick: NEGATIVE
Ketones, ur: NEGATIVE mg/dL
Nitrite: NEGATIVE
Protein, ur: NEGATIVE mg/dL
Specific Gravity, Urine: 1.004 — ABNORMAL LOW (ref 1.005–1.030)
pH: 7 (ref 5.0–8.0)

## 2020-02-13 LAB — GLUCOSE, CAPILLARY
Glucose-Capillary: 105 mg/dL — ABNORMAL HIGH (ref 70–99)
Glucose-Capillary: 108 mg/dL — ABNORMAL HIGH (ref 70–99)
Glucose-Capillary: 126 mg/dL — ABNORMAL HIGH (ref 70–99)
Glucose-Capillary: 189 mg/dL — ABNORMAL HIGH (ref 70–99)

## 2020-02-13 LAB — RENAL FUNCTION PANEL
Albumin: 2.6 g/dL — ABNORMAL LOW (ref 3.5–5.0)
Anion gap: 14 (ref 5–15)
BUN: 80 mg/dL — ABNORMAL HIGH (ref 8–23)
CO2: 15 mmol/L — ABNORMAL LOW (ref 22–32)
Calcium: 8.1 mg/dL — ABNORMAL LOW (ref 8.9–10.3)
Chloride: 106 mmol/L (ref 98–111)
Creatinine, Ser: 5.71 mg/dL — ABNORMAL HIGH (ref 0.44–1.00)
GFR calc Af Amer: 8 mL/min — ABNORMAL LOW (ref >60)
GFR calc non Af Amer: 7 mL/min — ABNORMAL LOW (ref >60)
Glucose, Bld: 102 mg/dL — ABNORMAL HIGH (ref 70–99)
Phosphorus: 6.5 mg/dL — ABNORMAL HIGH (ref 2.5–4.6)
Potassium: 5 mmol/L (ref 3.5–5.1)
Sodium: 135 mmol/L (ref 135–145)

## 2020-02-13 LAB — PROTEIN / CREATININE RATIO, URINE
Creatinine, Urine: 17.28 mg/dL
Protein Creatinine Ratio: 1.68 mg/mg{Cre} — ABNORMAL HIGH (ref 0.00–0.15)
Total Protein, Urine: 29 mg/dL

## 2020-02-13 LAB — CREATININE, URINE, RANDOM: Creatinine, Urine: 17.39 mg/dL

## 2020-02-13 LAB — NA AND K (SODIUM & POTASSIUM), RAND UR
Potassium Urine: 8 mmol/L
Sodium, Ur: 69 mmol/L

## 2020-02-13 LAB — MAGNESIUM: Magnesium: 2.1 mg/dL (ref 1.7–2.4)

## 2020-02-13 MED ORDER — SODIUM BICARBONATE 650 MG PO TABS
650.0000 mg | ORAL_TABLET | Freq: Two times a day (BID) | ORAL | Status: DC
  Administered 2020-02-13 – 2020-02-17 (×10): 650 mg via ORAL
  Filled 2020-02-13 (×10): qty 1

## 2020-02-13 NOTE — Progress Notes (Signed)
Patient ID: Cindy Burns, female   DOB: [birth year], 74 y.o.   MRN: [id number] S: No complaints O:BP (!) 152/62 (BP Location: Right Arm)   Pulse 66   Temp 98.5 F (36.9 C) (Oral)   Resp 16   Ht 5\' 2"  (1.575 m)   Wt 58.1 kg   SpO2 100%   BMI 23.43 kg/m   Intake/Output Summary (Last 24 hours) at 02/13/2020 1145 Last data filed at 02/13/2020 1053 Gross per 24 hour  Intake 3277.21 ml  Output 2750 ml  Net 527.21 ml   Intake/Output: I/O last 3 completed shifts: In: 2985 [P.O.:1402; I.V.:1583] Out: 3700 [Urine:3700]  Intake/Output this shift:  Total I/O In: 1012.3 [P.O.:480; I.V.:532.3] Out: 650 [Urine:650] Weight change: -0 kg Gen: NAD CVS: RRR, no rub Resp: cta Abd: +BS,soft, NT/ND Ext: no edema  Recent Labs  Lab 02/07/20 0636 02/08/20 0419 02/09/20 0508 02/10/20 0611 02/11/20 0748 02/12/20 0537 02/13/20 0405  NA 136 134* 134* 134* 132* 135 135  K 4.0 4.0 4.1 4.7 5.0 5.8* 5.0  CL 106 101 103 103 103 103 106  CO2 20* 19* 18* 21* 18* 19* 15*  GLUCOSE 108* 112* 105* 109* 125* 104* 102*  BUN 39* 44* 58* 64* 68* 79* 80*  CREATININE 5.16* 5.37* 5.80* 6.21* 5.77* 6.09* 5.71*  ALBUMIN 2.3* 2.4* 2.4* 2.4* 2.7* 2.5* 2.6*  CALCIUM 7.8* 8.1* 8.3* 8.2* 8.2* 8.4* 8.1*  PHOS 5.3* 5.7* 6.7* 6.7* 6.5* 6.8* 6.5*   Liver Function Tests: Recent Labs  Lab 02/11/20 0748 02/12/20 0537 02/13/20 0405  ALBUMIN 2.7* 2.5* 2.6*   No results for input(s): LIPASE, AMYLASE in the last 168 hours. No results for input(s): AMMONIA in the last 168 hours. CBC: Recent Labs  Lab 02/12/20 0537  WBC 5.6  HGB 8.1*  HCT 25.7*  MCV 99.6  PLT 191   Cardiac Enzymes: No results for input(s): CKTOTAL, CKMB, CKMBINDEX, TROPONINI in the last 168 hours. CBG: Recent Labs  Lab 02/12/20 1124 02/12/20 1650 02/12/20 2048 02/13/20 0651 02/13/20 1128  GLUCAP 134* 134* 178* 105* 108*    Iron Studies: No results for input(s): IRON, TIBC, TRANSFERRIN, FERRITIN in the last 72  hours. Studies/Results: No results found. [name] amLODipine  10 mg Oral Daily  . Chlorhexidine Gluconate Cloth  6 each Topical Q0600  . Chlorhexidine Gluconate Cloth  6 each Topical Q0600  . darbepoetin (ARANESP) injection - NON-DIALYSIS  100 mcg Subcutaneous Q Thu-1800  . enoxaparin (LOVENOX) injection  30 mg Subcutaneous Q24H  . feeding supplement (NEPRO CARB STEADY)  237 mL Oral TID BM  . insulin aspart  0-5 Units Subcutaneous QHS  . insulin aspart  0-6 Units Subcutaneous TID WC  . magnesium oxide  400 mg Oral Daily  . mouth rinse  15 mL Mouth Rinse BID  . metoprolol tartrate  25 mg Oral BID  . sodium zirconium cyclosilicate  10 g Oral Daily    BMET    Component Value Date/Time   NA 135 02/13/2020 0405   K 5.0 02/13/2020 0405   CL 106 02/13/2020 0405   CO2 15 (L) 02/13/2020 0405   GLUCOSE 102 (H) 02/13/2020 0405   BUN 80 (H) 02/13/2020 0405   CREATININE 5.71 (H) 02/13/2020 0405   CALCIUM 8.1 (L) 02/13/2020 0405   GFRNONAA 7 (L) 02/13/2020 0405   GFRAA 8 (L) 02/13/2020 0405   CBC    Component Value Date/Time   WBC 5.6 02/12/2020 0537   RBC 2.58 (L) 02/12/2020 0537   HGB  8.1 (L) 02/12/2020 0537   HCT 25.7 (L) 02/12/2020 0537   PLT 191 02/12/2020 0537   MCV 99.6 02/12/2020 0537   MCH 31.4 02/12/2020 0537   MCHC 31.5 02/12/2020 0537   RDW 18.6 (H) 02/12/2020 0537   LYMPHSABS 0.9 02/02/2020 0432   MONOABS 0.4 02/02/2020 0432   EOSABS 0.1 02/02/2020 0432   BASOSABS 0.0 02/02/2020 0432     Assessment/Plan:  1. AKI- normal Scr at baseline. Felt to be due to ATN from hypovolemia and hemodynamic insults due to infection but also had nephrotic range proteinuria and underwent renal biopsy on 02/04/20. She was started on CRRT 4/3-4/6 then eventually transitioned to IHD on 01/31/20 due to persistently elevated BUN/Cr and oliguria.  1. Her UOP hassignificantly improved over the last4days 2. Crfinally starting to improve off of dialysis with ivf's but now up again due to  post-ATN diuresis.  Will restart IVF's and follow. 3. Urged her to increase her po intake to more than 1 liter per day 4. No uremic symptoms. 5. Continue to hold off on HD for now as it appears she is starting the recovery phase of her ATN. 6. Need to seecontinuedimprovement before we can safely remove TDC and prepare for discharge. 7. Continue tofollow UOP and labs. 2. Nephrotic range proteinuria- s/p renal biopsy 02/04/20.Preliminary report was dense ATN and papillary necrosis, no immune mediated process. Electron microscopy pending. 1. SPEP and light chains normal, ANA and ANCA still pending. 2. Biopsy with severe ATN and papillary necrosis.  No immune mediated GN. 3. Hyperkalemia- improved with dose with lokelma and resumed IVF's as it is likely due to poor distal Na delivery due to volume depletion.  4. Metabolic acidosis- will start po bicarb and follow.  5. Acute hypoxic respiratory failure- due to COPD, intubated early in admission but has done well since. 6. UTI- due to E. Coli 7. DM type II- poorly controlled. 8. Left foot drop- MRI bulging disc at L3-L4 and protrusion L5-S1. To follow up with Neurosurgery as an outpatient.  Donetta Potts, MD [employer] [phone]

## 2020-02-13 NOTE — Progress Notes (Signed)
Progress Note    Cindy Burns  FXT:[id number] DOB: November 25, 1945  DOA: 01/08/2020 PCP: [name], MD    Brief Narrative:    Medical records reviewed and are as summarized below:  Cindy Burns is an 74 y.o. female with a past medical history that includes breast cancer, tobacco use, diabetes, COPD not on home oxygen, MDD admitted to the hospital April 3 with acute renal failure requiring CRRT, hyperkalemia, anion gap metabolic acidosis secondary to DKA, acute respiratory failure requiring intubation from April 3 April 6. She has had persistent ongoing kidney failure. Being followed by nephrology. Dialysis initiated April 27. She underwent a renal biopsy April 30 with results pending. Currently being followed closely by nephrology and they are directing care.   Assessment/Plan:   Principal Problem:   Renal failure Active Problems:   CIGARETTE SMOKER   Hypertension   Pressure injury of skin   Encounter for central line placement   Pain and swelling of ankle, left   Hypocalcemia   Metabolic acidosis   Acute respiratory failure with hypoxia (HCC)   UTI (urinary tract infection)  #1.  Acute kidney injury secondary to severe volume depletion in the setting of DKA/UTI/metabolic acidosis.  Dialysis started April 27.  Status post renal biopsy by interventional radiology April 30.  Preliminary path report per nephrology shows ATN.   -Nephrology -encouraged increased PO intake of fluids  #2.  Acute respiratory failure with hypoxia in the setting of COPD and former tobacco use.  Intubated from April 3 April 6.  Oxygen saturation greater than 90% on room air and have been so for the last week. -Mobilize -Continue incentive spirometry -Encouraged not to pick smoking back up when she gets out of here  #3.  Hyperkalemia.  Resolved  #4.  Urinary tract infection.  Pansensitive E. coli noted on her culture.  Blood cultures with no growth.  She completed a 7-day course of Rocephin.   She remains afebrile hemodynamically stable and nontoxic-appearing  #5.  Normocytic anemia.  Stable  #6.  Diabetes type 2 uncontrolled.  I suspect she has a long history of noncompliance.  Hemoglobin A1c 8.5 -Continue sliding scale  #7.  Left foot drop/pain and swelling of the left ankle.  X-ray left ankle negative.  MRI of the lumbar spine reveals L3-4 bulging and protrusion at L5-S1.  Evaluated by neurosurgery recommended outpatient follow-up and possible EMG. AFO/dorsiflexion brace provided  #8.  Pressure injury coccyx stage II.  Deep tissue pressure injury partial-thickness.  Appreciate WOC input. She is ambualtory   If not able to get patient outside today, plan to bring her outside tomorrow for fresh air  Family Communication/Anticipated D/C date and plan/Code Status   DVT prophylaxis: Heparin ordered. Code Status: Full Code.  Disposition Plan: Status is: Inpatient  Remains inpatient appropriate because:Inpatient level of care appropriate due to severity of illness   Dispo: The patient is from: Home              Anticipated d/c is to: Home              Anticipated d/c date is: when renal function becomes stable and patient is declared not to need HD              Patient currently is not medically stable to d/c.          Medical Consultants:    nephrology     Subjective:   Sleeping soundly  Objective:  Vitals:   02/12/20 2047 02/13/20 0449 02/13/20 0500 02/13/20 1042  BP: (!) 127/53 (!) 120/51  (!) 152/62  Pulse: 70 64  66  Resp: 18 18  16   Temp: 98.5 F (36.9 C) 98.5 F (36.9 C)    TempSrc: Oral Oral    SpO2: 96% 95%  100%  Weight: 58.1 kg  58.1 kg   Height:        Intake/Output Summary (Last 24 hours) at 02/13/2020 1114 Last data filed at 02/13/2020 1053 Gross per 24 hour  Intake 3277.21 ml  Output 2750 ml  Net 527.21 ml   Filed Weights   02/12/20 0504 02/12/20 2047 02/13/20 0500  Weight: 58.1 kg 58.1 kg 58.1 kg    Exam: In bed,  resting well  Data Reviewed:   I have personally reviewed following labs and imaging studies:  Labs: Labs show the following:   Basic Metabolic Panel: Recent Labs  Lab 02/09/20 0508 02/09/20 0508 02/10/20 0611 02/10/20 0611 02/11/20 0748 02/11/20 0748 02/12/20 0537 02/13/20 0405  NA 134*  --  134*  --  132*  --  135 135  K 4.1   < > 4.7   < > 5.0   < > 5.8* 5.0  CL 103  --  103  --  103  --  103 106  CO2 18*  --  21*  --  18*  --  19* 15*  GLUCOSE 105*  --  109*  --  125*  --  104* 102*  BUN 58*  --  64*  --  68*  --  79* 80*  CREATININE 5.80*  --  6.21*  --  5.77*  --  6.09* 5.71*  CALCIUM 8.3*  --  8.2*  --  8.2*  --  8.4* 8.1*  MG 2.2  --  2.2  --  2.3  --  2.2 2.1  PHOS 6.7*  --  6.7*  --  6.5*  --  6.8* 6.5*   < > = values in this interval not displayed.   GFR Estimated Creatinine Clearance: 7.1 mL/min (A) (by C-G formula based on SCr of 5.71 mg/dL (H)). Liver Function Tests: Recent Labs  Lab 02/09/20 0508 02/10/20 0611 02/11/20 0748 02/12/20 0537 02/13/20 0405  ALBUMIN 2.4* 2.4* 2.7* 2.5* 2.6*   No results for input(s): LIPASE, AMYLASE in the last 168 hours. No results for input(s): AMMONIA in the last 168 hours. Coagulation profile No results for input(s): INR, PROTIME in the last 168 hours.  CBC: Recent Labs  Lab 02/12/20 0537  WBC 5.6  HGB 8.1*  HCT 25.7*  MCV 99.6  PLT 191   Cardiac Enzymes: No results for input(s): CKTOTAL, CKMB, CKMBINDEX, TROPONINI in the last 168 hours. BNP (last 3 results) No results for input(s): PROBNP in the last 8760 hours. CBG: Recent Labs  Lab 02/12/20 0730 02/12/20 1124 02/12/20 1650 02/12/20 2048 02/13/20 0651  GLUCAP 100* 134* 134* 178* 105*   D-Dimer: No results for input(s): DDIMER in the last 72 hours. Hgb A1c: No results for input(s): HGBA1C in the last 72 hours. Lipid Profile: No results for input(s): CHOL, HDL, LDLCALC, TRIG, CHOLHDL, LDLDIRECT in the last 72 hours. Thyroid function  studies: No results for input(s): TSH, T4TOTAL, T3FREE, THYROIDAB in the last 72 hours.  Invalid input(s): FREET3 Anemia work up: No results for input(s): VITAMINB12, FOLATE, FERRITIN, TIBC, IRON, RETICCTPCT in the last 72 hours. Sepsis Labs: Recent Labs  Lab 02/12/20 0537  WBC 5.6  Microbiology Recent Results (from the past 240 hour(s))  Culture, Urine     Status: Abnormal   Collection Time: 02/03/20  5:35 PM   Specimen: Urine, Random  Result Value Ref Range Status   Specimen Description URINE, RANDOM  Final   Special Requests   Final    NONE Performed at [facility], [address], [place], [place] [address]    Culture MULTIPLE SPECIES PRESENT, SUGGEST RECOLLECTION (A)  Final   Report Status 02/04/2020 FINAL  Final    Procedures and diagnostic studies:  No results found.  Medications:   . amLODipine  10 mg Oral Daily  . Chlorhexidine Gluconate Cloth  6 each Topical Q0600  . Chlorhexidine Gluconate Cloth  6 each Topical Q0600  . darbepoetin (ARANESP) injection - NON-DIALYSIS  100 mcg Subcutaneous Q Thu-1800  . enoxaparin (LOVENOX) injection  30 mg Subcutaneous Q24H  . feeding supplement (NEPRO CARB STEADY)  237 mL Oral TID BM  . insulin aspart  0-5 Units Subcutaneous QHS  . insulin aspart  0-6 Units Subcutaneous TID WC  . magnesium oxide  400 mg Oral Daily  . mouth rinse  15 mL Mouth Rinse BID  . metoprolol tartrate  25 mg Oral BID  . sodium zirconium cyclosilicate  10 g Oral Daily   Continuous Infusions: . sodium chloride Stopped (01/13/20 0735)  . sodium chloride 100 mL/hr at 02/13/20 0200     LOS: 36 days   [place] Hospitalists   How to contact the [facility] Attending or Consulting provider [place] or covering provider during after hours [place], for this patient?  1. Check the care team in [facility] and look for a) attending/consulting TRH provider listed and b) the [facility] team listed 2. Log into [email or web address] and use [place]  universal password to access. If you do not have the password, please contact the hospital operator. 3. Locate the [facility] provider you are looking for under [facility] and page to a number that you can be directly reached. 4. If you still have difficulty reaching the provider, please page the [facility] (Director on Call) for the Hospitalists listed on amion for assistance.  02/13/2020, 11:14 AM

## 2020-02-14 LAB — RENAL FUNCTION PANEL
Albumin: 2.5 g/dL — ABNORMAL LOW (ref 3.5–5.0)
Anion gap: 14 (ref 5–15)
BUN: 80 mg/dL — ABNORMAL HIGH (ref 8–23)
CO2: 16 mmol/L — ABNORMAL LOW (ref 22–32)
Calcium: 8 mg/dL — ABNORMAL LOW (ref 8.9–10.3)
Chloride: 107 mmol/L (ref 98–111)
Creatinine, Ser: 5.74 mg/dL — ABNORMAL HIGH (ref 0.44–1.00)
GFR calc Af Amer: 8 mL/min — ABNORMAL LOW (ref >60)
GFR calc non Af Amer: 7 mL/min — ABNORMAL LOW (ref >60)
Glucose, Bld: 108 mg/dL — ABNORMAL HIGH (ref 70–99)
Phosphorus: 6.5 mg/dL — ABNORMAL HIGH (ref 2.5–4.6)
Potassium: 4.7 mmol/L (ref 3.5–5.1)
Sodium: 137 mmol/L (ref 135–145)

## 2020-02-14 LAB — GLUCOSE, CAPILLARY
Glucose-Capillary: 93 mg/dL (ref 70–99)
Glucose-Capillary: 117 mg/dL — ABNORMAL HIGH (ref 70–99)
Glucose-Capillary: 151 mg/dL — ABNORMAL HIGH (ref 70–99)
Glucose-Capillary: 139 mg/dL — ABNORMAL HIGH (ref 70–99)

## 2020-02-14 LAB — MAGNESIUM: Magnesium: 2.1 mg/dL (ref 1.7–2.4)

## 2020-02-14 MED ORDER — IPRATROPIUM-ALBUTEROL 0.5-2.5 (3) MG/3ML IN SOLN
3.0000 mL | Freq: Four times a day (QID) | RESPIRATORY_TRACT | Status: AC
  Administered 2020-02-14 (×2): 3 mL via RESPIRATORY_TRACT
  Filled 2020-02-14 (×4): qty 3

## 2020-02-14 NOTE — Progress Notes (Deleted)
OT Cancellation Note  Patient Details Name: Cindy Burns MRN: [id number] DOB: 05-21-1946   Cancelled Treatment:    Reason Eval/Treat Not Completed: Patient at procedure or test/ unavailable. Pt of unit for HD, will try back later as appropriate/as time allows  Britt Bottom 02/14/2020, 11:28 AM

## 2020-02-14 NOTE — Progress Notes (Signed)
Progress Note    Cindy Burns  WJX:[id number] DOB: 1945-10-26  DOA: 01/08/2020 PCP: [name], MD    Brief Narrative:    Medical records reviewed and are as summarized below:  Cindy Burns is an 74 y.o. female with a past medical history that includes breast cancer, tobacco use, diabetes, COPD not on home oxygen, MDD admitted to the hospital April 3 with acute renal failure requiring CRRT, hyperkalemia, anion gap metabolic acidosis secondary to DKA, acute respiratory failure requiring intubation from April 3 April 6. She has had persistent ongoing kidney failure. Being followed by nephrology. Dialysis initiated April 27. She underwent a renal biopsy April 30 with results pending. Currently being followed closely by nephrology and they are directing care.   Assessment/Plan:   Principal Problem:   Renal failure Active Problems:   Hypocalcemia   Metabolic acidosis   Acute respiratory failure with hypoxia (HCC)   [employer]   Hypertension   UTI (urinary tract infection)   Pressure injury of skin   Encounter for central line placement   Pain and swelling of ankle, left   #1.  Acute kidney injury acute renal failure secondary to severe volume depletion in the setting of DKA UTI/metabolic acidosis.  Dialysis started April 27.  She is status post renal biopsy by interventional radiology April 30.  Preliminary path report per nephrology shows ATN.  V fluids provided yesterday per nephrology. Per nephrology in diuresing phase of recovery and need to keep up hydration -Management per nephrology -encourage po fluids  #2.  Acute respiratory failure with hypoxia in the setting of COPD and former tobacco use.  She was intubated from April 3 April 6.  Oxygen saturation level 96% on room air.  She has some increased work of breathing today with some wheezing. -Scheduled nebs x3 -Monitor oxygen saturation level -Mobilize -Incentive spirometry -Supplemental oxygen as  indicated  #3.  Hyperkalemia.  Resolved. See meds  #4.  Urinary tract infection.  Pansensitive E. coli noted on her culture.  Blood cultures with no growth to date.  She did complete a 7-day course of Rocephin.  She remains afebrile hemodynamically stable and nontoxic appearing.  She denies any dysuria hematuria frequency or urgency  #5.  Normocytic anemia.  Stable  6.  Diabetes type 2.  Uncontrolled.  Suspect she has got a long history of noncompliance.  Hemoglobin A1c was 8.5 -Sliding scale insulin for optimal control  #7.  Left foot drop/pain and swelling of the left ankle.  X-ray of the left ankle negative.  She did get MRI of the lumbar spine revealing L3-4 bulging and protrusion at L5 1.  Evaluated by neurosurgery who recommended outpatient follow-up and possible EMG.  AFO/dorsiflexion brace provided  #8.  Pressure injury coccyx stage II.   Family Communication/Anticipated D/C date and plan/Code Status   DVT prophylaxis: heparin ordered. Code Status: Full Code.  Family Communication: patient Disposition Plan: Status is: Inpatient  Remains inpatient appropriate because:Inpatient level of care appropriate due to severity of illness   Dispo: The patient is from: Home              Anticipated d/c is to: Home              Anticipated d/c date is: 2 days              Patient currently is not medically stable to d/c.          Medical Consultants:  [facility]   Anti-Infectives:    None  Subjective:   Teary this am. Wants to go home. Denies pain discomfort. Reports wheezes and some mild sob  Objective:    Vitals:   02/13/20 1744 02/13/20 2033 02/14/20 0647 02/14/20 1012  BP: (!) 138/54 (!) 156/58 139/62 (!) 129/57  Pulse: 69 73 72 65  Resp: 16 18 16 18   Temp: (!) 97.5 F (36.4 C) 97.8 F (36.6 C) 97.6 F (36.4 C) 98.4 F (36.9 C)  TempSrc: Oral Oral Oral Oral  SpO2: 99% 99% 96% 100%  Weight:      Height:        Intake/Output Summary  (Last 24 hours) at 02/14/2020 1053 Last data filed at 02/14/2020 1015 Gross per 24 hour  Intake 3172.9 ml  Output 3050 ml  Net 122.9 ml   Filed Weights   02/12/20 0504 02/12/20 2047 02/13/20 0500  Weight: 58.1 kg 58.1 kg 58.1 kg    Exam: General: Awake alert no acute distress CV: Regular rate and rhythm no murmur gallop or rub no lower extremity edema Respiratory: Mild increased work of breathing with conversation.  Some use of abdominal accessory muscles.  Breath sounds with fair air movement and distant end expiratory wheezing.  I hear no crackles no rhonchi Abdomen: Soft positive bowel sounds throughout no guarding or rebounding Musculoskeletal joints without swelling/erythema Neuro alert and oriented x3 speech clear facial symmetry appears to have some short-term memory issues  Data Reviewed:   I have personally reviewed following labs and imaging studies:  Labs: Labs show the following:   Basic Metabolic Panel: Recent Labs  Lab 02/10/20 0611 02/10/20 0611 02/11/20 0748 02/11/20 0748 02/12/20 0537 02/12/20 0537 02/13/20 0405 02/14/20 0327  NA 134*  --  132*  --  135  --  135 137  K 4.7   < > 5.0   < > 5.8*   < > 5.0 4.7  CL 103  --  103  --  103  --  106 107  CO2 21*  --  18*  --  19*  --  15* 16*  GLUCOSE 109*  --  125*  --  104*  --  102* 108*  BUN 64*  --  68*  --  79*  --  80* 80*  CREATININE 6.21*  --  5.77*  --  6.09*  --  5.71* 5.74*  CALCIUM 8.2*  --  8.2*  --  8.4*  --  8.1* 8.0*  MG 2.2  --  2.3  --  2.2  --  2.1 2.1  PHOS 6.7*  --  6.5*  --  6.8*  --  6.5* 6.5*   < > = values in this interval not displayed.   GFR Estimated Creatinine Clearance: 7.1 mL/min (A) (by C-G formula based on SCr of 5.74 mg/dL (H)). Liver Function Tests: Recent Labs  Lab 02/10/20 0611 02/11/20 0748 02/12/20 0537 02/13/20 0405 02/14/20 0327  ALBUMIN 2.4* 2.7* 2.5* 2.6* 2.5*   No results for input(s): LIPASE, AMYLASE in the last 168 hours. No results for input(s):  AMMONIA in the last 168 hours. Coagulation profile No results for input(s): INR, PROTIME in the last 168 hours.  CBC: Recent Labs  Lab 02/12/20 0537  WBC 5.6  HGB 8.1*  HCT 25.7*  MCV 99.6  PLT 191   Cardiac Enzymes: No results for input(s): CKTOTAL, CKMB, CKMBINDEX, TROPONINI in the last 168 hours. BNP (last 3 results) No results for input(s): PROBNP in  the last 8760 hours. CBG: Recent Labs  Lab 02/13/20 0651 02/13/20 1128 02/13/20 1747 02/13/20 2144 02/14/20 0740  GLUCAP 105* 108* 126* 189* 93   D-Dimer: No results for input(s): DDIMER in the last 72 hours. Hgb A1c: No results for input(s): HGBA1C in the last 72 hours. Lipid Profile: No results for input(s): CHOL, HDL, LDLCALC, TRIG, CHOLHDL, LDLDIRECT in the last 72 hours. Thyroid function studies: No results for input(s): TSH, T4TOTAL, T3FREE, THYROIDAB in the last 72 hours.  Invalid input(s): FREET3 Anemia work up: No results for input(s): VITAMINB12, FOLATE, FERRITIN, TIBC, IRON, RETICCTPCT in the last 72 hours. Sepsis Labs: Recent Labs  Lab 02/12/20 0537  WBC 5.6    Microbiology No results found for this or any previous visit (from the past 240 hour(s)).  Procedures and diagnostic studies:  No results found.  Medications:   . amLODipine  10 mg Oral Daily  . Chlorhexidine Gluconate Cloth  6 each Topical Q0600  . Chlorhexidine Gluconate Cloth  6 each Topical Q0600  . darbepoetin (ARANESP) injection - NON-DIALYSIS  100 mcg Subcutaneous Q Thu-1800  . enoxaparin (LOVENOX) injection  30 mg Subcutaneous Q24H  . feeding supplement (NEPRO CARB STEADY)  237 mL Oral TID BM  . insulin aspart  0-5 Units Subcutaneous QHS  . insulin aspart  0-6 Units Subcutaneous TID WC  . ipratropium-albuterol  3 mL Nebulization Q6H  . magnesium oxide  400 mg Oral Daily  . mouth rinse  15 mL Mouth Rinse BID  . metoprolol tartrate  25 mg Oral BID  . sodium bicarbonate  650 mg Oral BID  . sodium zirconium cyclosilicate  10  g Oral Daily   Continuous Infusions: . sodium chloride Stopped (01/13/20 0735)  . sodium chloride 100 mL/hr at 02/13/20 2041     LOS: 37 days   Radene Gunning NP  [facility]   How to contact the [facility] Attending or Consulting provider [place] or covering provider during after hours [place], for this patient?  1. Check the care team in [facility] and look for a) attending/consulting [facility] provider listed and b) the [facility] team listed 2. Log into [email or web address] and use [place]'s universal password to access. If you do not have the password, please contact the hospital operator. 3. Locate the [facility] provider you are looking for under [facility] and page to a number that you can be directly reached. 4. If you still have difficulty reaching the provider, please page the [facility] (Director on Call) for the Hospitalists listed on amion for assistance.  02/14/2020, 10:53 AM

## 2020-02-14 NOTE — Progress Notes (Signed)
Occupational Therapy Treatment Patient Details Name: Cindy Burns MRN: [id number] DOB: 1946-04-07 Today's Date: 02/14/2020    History of present illness Pt is a 74 year old woman admitted on 01/08/20 to [facility] and subsequently transferred to [facility] with respiratory failure requring intubation, DKA, UTI, hyperkalemia and renal failure requiring CRRT. Extubated 01/11/20. PMH: COPD, breast cancer, Dm, MDD.   OT comments  Pt making consistent progress with functional goals. Pt sitting EOB upon arrival. Session focused on ALDs in standing with sup, toilet transfers and simulated tub shower transfers. Reiterated with pt that use of tb bench at home is safest. OT will continue to follow acutelu  Follow Up Recommendations  Home health OT;Supervision - Intermittent    Equipment Recommendations  Tub/shower bench    Recommendations for Other Services      Precautions / Restrictions Precautions Precautions: Fall Required Braces or Orthoses: Other Brace Other Brace: declined wearing AFO, but has in room Restrictions Weight Bearing Restrictions: No       Mobility Bed Mobility                  Transfers                      Balance                                           ADL either performed or assessed with clinical judgement   ADL Overall ADL's : Needs assistance/impaired Eating/Feeding: Independent;Sitting   Grooming: Wash/dry hands;Wash/dry face;Supervision/safety;Standing   Upper Body Bathing: Supervision/ safety;Standing   Lower Body Bathing: Supervison/ safety;Sit to/from stand Lower Body Bathing Details (indicate cue type and reason): simulated Upper Body Dressing : Supervision/safety;Standing   Lower Body Dressing: Supervision/safety;Sitting/lateral leans Lower Body Dressing Details (indicate cue type and reason): donning socks Toilet Transfer: Supervision/safety;Ambulation;Regular Toilet;Grab bars;Cueing for safety   Toileting- Clothing  Manipulation and Hygiene: Sit to/from stand;Modified independent     Tub/Shower Transfer Details (indicate cue type and reason): discussed option of DME for use in shower (BSC vs tub bench), given pt lives alone and currently with increased fall risk pt verbalizing that tub bench likely safer option (OT in agreement), discussed safe transfer techniques  Functional mobility during ADLs: Supervision/safety;Rolling walker       Vision       Perception     Praxis      Cognition                                                Exercises     Shoulder Instructions       General Comments      Pertinent Vitals/ Pain          Home Living                                          Prior Functioning/Environment              Frequency  Min 2X/week        Progress Toward Goals  OT Goals(current goals can now be found in the care plan section)  Progress towards OT goals:  Progressing toward goals     Plan Discharge plan remains appropriate    Co-evaluation                 AM-PAC OT "6 Clicks" Daily Activity     Outcome Measure   Help from another person eating meals?: None Help from another person taking care of personal grooming?: A Little Help from another person toileting, which includes using toliet, bedpan, or urinal?: A Little Help from another person bathing (including washing, rinsing, drying)?: A Little Help from another person to put on and taking off regular upper body clothing?: A Little Help from another person to put on and taking off regular lower body clothing?: A Little 6 Click Score: 19    End of Session Equipment Utilized During Treatment: Gait belt;Other (comment)(rollater)  OT Visit Diagnosis: Muscle weakness (generalized) (M62.81);Other symptoms and signs involving cognitive function;Unsteadiness on feet (R26.81)   Activity Tolerance Patient tolerated treatment well   Patient Left in bed;with  call bell/phone within reach;Other (comment)(sitting EOB)   Nurse Communication          Time: 5947-0761 OT Time Calculation (min): 26 min  Charges: OT Treatments $Self Care/Home Management : 8-22 mins $Therapeutic Activity: 8-22 mins     Britt Bottom 02/14/2020, 3:31 PM

## 2020-02-14 NOTE — Progress Notes (Signed)
Physical Therapy Treatment Patient Details Name: Cindy Burns MRN: [id number] DOB: [birth year] Today's Date: 02/14/2020    History of Present Illness Pt is a 74 year old woman admitted on 01/08/20 to [facility] and subsequently transferred to [facility] with respiratory failure requring intubation, DKA, UTI, hyperkalemia and renal failure requiring CRRT. Extubated 01/11/20. PMH: COPD, breast cancer, Dm, MDD.    PT Comments    Continuing work on functional mobility and activity tolerance;  Given her very nice progress, we trialed walking without assistive device -- still unsteady, and tends to reach out for UE support; at this point continue to recommend Rollator; worth considering trying a cane next session  Follow Up Recommendations  Home health PT;Other (comment)(vs OPPT if reliable transportation)     Equipment Recommendations  Other (comment)(rollator)    Recommendations for Other Services       Precautions / Restrictions Precautions Precautions: Fall Precaution Comments: Fall risk greatly reduced with use of RW Required Braces or Orthoses: Other Brace Other Brace: AFO    Mobility  Bed Mobility Overal bed mobility: Modified Independent                Transfers Overall transfer level: Needs assistance   Transfers: Sit to/from Stand Sit to Stand: Supervision         General transfer comment: Supervision for safety.   Ambulation/Gait Ambulation/Gait assistance: Min guard;Supervision Gait Distance (Feet): 150 Feet Assistive device: None;4-wheeled walker Gait Pattern/deviations: Step-through pattern     General Gait Details: Wore AFO to amb today; Initiated walking without AD; noted tendency to reach out for UE support, and much slower steps -- more steady with Field seismologist    Modified Rankin (Stroke Patients Only)       Balance                                            Cognition Arousal/Alertness:  Awake/alert Behavior During Therapy: WFL for tasks assessed/performed Overall Cognitive Status: Within Functional Limits for tasks assessed                                        Exercises      General Comments General comments (skin integrity, edema, etc.): Took extra time to work through AFO and shoe fit; much better if you start with completely loosening the laces; it takes extra time, but makes for a better fit, and she reported no pain from shoe tightness      Pertinent Vitals/Pain Pain Assessment: No/denies pain Faces Pain Scale: No hurt    Home Living                      Prior Function            PT Goals (current goals can now be found in the care plan section) Acute Rehab PT Goals Patient Stated Goal: Home to her puppies PT Goal Formulation: With patient Time For Goal Achievement: 02/24/20 Potential to Achieve Goals: Good Progress towards PT goals: Progressing toward goals    Frequency    Min 3X/week      PT Plan Current plan remains appropriate    Co-evaluation  AM-PAC PT "6 Clicks" Mobility   Outcome Measure  Help needed turning from your back to your side while in a flat bed without using bedrails?: None Help needed moving from lying on your back to sitting on the side of a flat bed without using bedrails?: None Help needed moving to and from a bed to a chair (including a wheelchair)?: None Help needed standing up from a chair using your arms (e.g., wheelchair or bedside chair)?: None Help needed to walk in hospital room?: A Little Help needed climbing 3-5 steps with a railing? : A Little 6 Click Score: 22    End of Session   Activity Tolerance: Patient tolerated treatment well Patient left: in bed;with call bell/phone within reach;Other (comment) Nurse Communication: Mobility status PT Visit Diagnosis: Unsteadiness on feet (R26.81);Other abnormalities of gait and mobility (R26.89);Muscle weakness  (generalized) (M62.81)     Time: 3291-9166 PT Time Calculation (min) (ACUTE ONLY): 25 min  Charges:  $Gait Training: 8-22 mins $Therapeutic Activity: 8-22 mins                     Roney Marion, PT  Acute Rehabilitation Services Pager [phone] Office [place] 02/14/2020, 7:53 PM

## 2020-02-14 NOTE — Progress Notes (Signed)
Patient ID: Cindy Burns, female   DOB: 07/06/1946, 74 y.o.   MRN: [id number] S: No complaints- 3 liters of UOP -  crt stable   O:BP (!) 129/57 (BP Location: Right Arm)   Pulse 65   Temp 98.4 F (36.9 C) (Oral)   Resp 18   Ht 5\' 2"  (1.575 m)   Wt 58.1 kg   SpO2 100%   BMI 23.43 kg/m   Intake/Output Summary (Last 24 hours) at 02/14/2020 1216 Last data filed at 02/14/2020 1141 Gross per 24 hour  Intake 3172.9 ml  Output 3350 ml  Net -177.1 ml   Intake/Output: I/O last 3 completed shifts: In: 4367.9 [P.O.:1300; I.V.:3067.9] Out: 4100 [Urine:4100]  Intake/Output this shift:  Total I/O In: 783.5 [P.O.:360; I.V.:423.5] Out: 1000 [Urine:1000] Weight change:  Gen: NAD CVS: RRR, no rub Resp: cta Abd: +BS,soft, NT/ND Ext: no edema  Recent Labs  Lab 02/08/20 0419 02/09/20 0508 02/10/20 0611 02/11/20 0748 02/12/20 0537 02/13/20 0405 02/14/20 0327  NA 134* 134* 134* 132* 135 135 137  K 4.0 4.1 4.7 5.0 5.8* 5.0 4.7  CL 101 103 103 103 103 106 107  CO2 19* 18* 21* 18* 19* 15* 16*  GLUCOSE 112* 105* 109* 125* 104* 102* 108*  BUN 44* 58* 64* 68* 79* 80* 80*  CREATININE 5.37* 5.80* 6.21* 5.77* 6.09* 5.71* 5.74*  ALBUMIN 2.4* 2.4* 2.4* 2.7* 2.5* 2.6* 2.5*  CALCIUM 8.1* 8.3* 8.2* 8.2* 8.4* 8.1* 8.0*  PHOS 5.7* 6.7* 6.7* 6.5* 6.8* 6.5* 6.5*   Liver Function Tests: Recent Labs  Lab 02/12/20 0537 02/13/20 0405 02/14/20 0327  ALBUMIN 2.5* 2.6* 2.5*   No results for input(s): LIPASE, AMYLASE in the last 168 hours. No results for input(s): AMMONIA in the last 168 hours. CBC: Recent Labs  Lab 02/12/20 0537  WBC 5.6  HGB 8.1*  HCT 25.7*  MCV 99.6  PLT 191   Cardiac Enzymes: No results for input(s): CKTOTAL, CKMB, CKMBINDEX, TROPONINI in the last 168 hours. CBG: Recent Labs  Lab 02/13/20 1128 02/13/20 1747 02/13/20 2144 02/14/20 0740 02/14/20 1121  GLUCAP 108* 126* 189* 93 117*    Iron Studies: No results for input(s): IRON, TIBC, TRANSFERRIN, FERRITIN in  the last 72 hours. Studies/Results: No results found. [name] amLODipine  10 mg Oral Daily  . Chlorhexidine Gluconate Cloth  6 each Topical Q0600  . Chlorhexidine Gluconate Cloth  6 each Topical Q0600  . darbepoetin (ARANESP) injection - NON-DIALYSIS  100 mcg Subcutaneous Q Thu-1800  . enoxaparin (LOVENOX) injection  30 mg Subcutaneous Q24H  . feeding supplement (NEPRO CARB STEADY)  237 mL Oral TID BM  . insulin aspart  0-5 Units Subcutaneous QHS  . insulin aspart  0-6 Units Subcutaneous TID WC  . ipratropium-albuterol  3 mL Nebulization Q6H  . magnesium oxide  400 mg Oral Daily  . mouth rinse  15 mL Mouth Rinse BID  . metoprolol tartrate  25 mg Oral BID  . sodium bicarbonate  650 mg Oral BID  . sodium zirconium cyclosilicate  10 g Oral Daily    BMET    Component Value Date/Time   NA 137 02/14/2020 0327   K 4.7 02/14/2020 0327   CL 107 02/14/2020 0327   CO2 16 (L) 02/14/2020 0327   GLUCOSE 108 (H) 02/14/2020 0327   BUN 80 (H) 02/14/2020 0327   CREATININE 5.74 (H) 02/14/2020 0327   CALCIUM 8.0 (L) 02/14/2020 0327   GFRNONAA 7 (L) 02/14/2020 0327   GFRAA 8 (L) 02/14/2020  0327   CBC    Component Value Date/Time   WBC 5.6 02/12/2020 0537   RBC 2.58 (L) 02/12/2020 0537   HGB 8.1 (L) 02/12/2020 0537   HCT 25.7 (L) 02/12/2020 0537   PLT 191 02/12/2020 0537   MCV 99.6 02/12/2020 0537   MCH 31.4 02/12/2020 0537   MCHC 31.5 02/12/2020 0537   RDW 18.6 (H) 02/12/2020 0537   LYMPHSABS 0.9 02/02/2020 0432   MONOABS 0.4 02/02/2020 0432   EOSABS 0.1 02/02/2020 0432   BASOSABS 0.0 02/02/2020 0432     Assessment/Plan:  1. AKI- normal Scr at baseline. Felt to be due to ATN from hypovolemia and hemodynamic insults due to infection but also had nephrotic range proteinuria - underwent renal biopsy on 02/04/20- dense ATN/ acute papillary necrosis. She was started on CRRT 4/3-4/6 then eventually transitioned to IHD on 01/31/20 but last HD on 4/30.  1. Her UOP hassignificantly improved   2. Crstarting to improve off of dialysis with ivf's but now up again.  Restarted IVF's at 100 per hour and follow. 3. Urged her to increase her po intake  4. No uremic symptoms. 5. Continue to hold off on HD for now as it appears she is starting the recovery phase of her ATN.Last HD 4/30 6. Need to seecontinuedimprovement before we can safely remove TDC and prepare for discharge- not quite yet.  If renal function not worse- could conceivably be managed as an OP but also will need to be able to keep up with output-  Will assess day to day   2. Nephrotic range proteinuria- s/p renal biopsy 02/04/20.Preliminary report was dense ATN and papillary necrosis, no immune mediated process.    3. Hyperkalemia- improved with dose with lokelma and resumed IVF's as it is likely due to poor distal Na delivery due to volume depletion. better- stop lokelma 4. Metabolic acidosis-  po bicarb and follow.  5. Acute hypoxic respiratory failure- due to COPD, intubated early in admission but has done well since. 6. UTI- due to E. Coli 7. DM type II- poorly controlled. 8. Left foot drop- MRI bulging disc at L3-L4 and protrusion L5-S1. To follow up with Neurosurgery as an outpatient.  Louis Meckel  [employer] [phone]

## 2020-02-15 ENCOUNTER — Inpatient Hospital Stay (HOSPITAL_COMMUNITY): Payer: Medicare Other

## 2020-02-15 LAB — RENAL FUNCTION PANEL
Albumin: 2.6 g/dL — ABNORMAL LOW (ref 3.5–5.0)
Anion gap: 13 (ref 5–15)
BUN: 78 mg/dL — ABNORMAL HIGH (ref 8–23)
CO2: 15 mmol/L — ABNORMAL LOW (ref 22–32)
Calcium: 7.9 mg/dL — ABNORMAL LOW (ref 8.9–10.3)
Chloride: 110 mmol/L (ref 98–111)
Creatinine, Ser: 6.04 mg/dL — ABNORMAL HIGH (ref 0.44–1.00)
GFR calc Af Amer: 7 mL/min — ABNORMAL LOW (ref >60)
GFR calc non Af Amer: 6 mL/min — ABNORMAL LOW (ref >60)
Glucose, Bld: 118 mg/dL — ABNORMAL HIGH (ref 70–99)
Phosphorus: 6.9 mg/dL — ABNORMAL HIGH (ref 2.5–4.6)
Potassium: 4.9 mmol/L (ref 3.5–5.1)
Sodium: 138 mmol/L (ref 135–145)

## 2020-02-15 LAB — MAGNESIUM: Magnesium: 1.9 mg/dL (ref 1.7–2.4)

## 2020-02-15 LAB — GLUCOSE, CAPILLARY
Glucose-Capillary: 110 mg/dL — ABNORMAL HIGH (ref 70–99)
Glucose-Capillary: 151 mg/dL — ABNORMAL HIGH (ref 70–99)
Glucose-Capillary: 178 mg/dL — ABNORMAL HIGH (ref 70–99)
Glucose-Capillary: 234 mg/dL — ABNORMAL HIGH (ref 70–99)

## 2020-02-15 MED ORDER — METHYLPREDNISOLONE SODIUM SUCC 125 MG IJ SOLR
80.0000 mg | Freq: Once | INTRAMUSCULAR | Status: AC
  Administered 2020-02-15: 80 mg via INTRAVENOUS
  Filled 2020-02-15: qty 2

## 2020-02-15 MED ORDER — ALBUTEROL SULFATE (2.5 MG/3ML) 0.083% IN NEBU
2.5000 mg | INHALATION_SOLUTION | RESPIRATORY_TRACT | Status: DC | PRN
  Administered 2020-02-15 – 2020-02-17 (×2): 2.5 mg via RESPIRATORY_TRACT
  Filled 2020-02-15 (×3): qty 3

## 2020-02-15 MED ORDER — IPRATROPIUM-ALBUTEROL 0.5-2.5 (3) MG/3ML IN SOLN
3.0000 mL | Freq: Four times a day (QID) | RESPIRATORY_TRACT | Status: AC
  Administered 2020-02-15 (×2): 3 mL via RESPIRATORY_TRACT
  Filled 2020-02-15 (×3): qty 3

## 2020-02-15 MED ORDER — PREDNISONE 50 MG PO TABS
50.0000 mg | ORAL_TABLET | Freq: Every day | ORAL | Status: DC
  Administered 2020-02-16: 50 mg via ORAL
  Filled 2020-02-15: qty 1

## 2020-02-15 NOTE — Progress Notes (Signed)
Progress Note    Cindy Burns  WSF:[id number] DOB: [birth year]  DOA: 01/08/2020 PCP: [name], MD    Brief Narrative:    Medical records reviewed and are as summarized below:  Cindy Burns is an 74 y.o. female with a past medical history significant for breast cancer, tobacco use, diabetes, COPD not on home oxygen, MDD admitted to the hospital April 3 with acute renal failure requiring CRRT, hyperkalemia, anion gap metabolic acidosis secondary to DKA, acute respiratory failure requiring intubation from April 3 April 6.  She has had persistent ongoing kidney failure.  She is being followed by nephrology.  Dialysis was started on April 27.  She underwent a renal biopsy April 30 revealing ATN.  Currently in diuresis phase of recovery.  Evaluated daily by nephrology  Assessment/Plan:   Principal Problem:   Renal failure Active Problems:   Hypocalcemia   Metabolic acidosis   Acute respiratory failure with hypoxia (HCC)   C[employer]AR[employer] S[employer]   Hypertension   UTI (urinary tract infection)   Pressure injury of skin   Encounter for central line placement   Pain and swelling of ankle, left  #1.  Acute kidney injury acute renal failure secondary to severe volume depletion in the setting of DKA UTI/metabolic acidosis.  Dialysis started April 27.  Last hemodialysis session was April 30. She is status post renal biopsy by interventional radiology April 30.  Preliminary path report per nephrology shows ATN. IV fluids stopped today per nephrology. Per nephrology in diuresing phase of recovery and need to keep up hydration. Encourage po fluids.  -Management per nephrology -encourage po fluids  #2.  Acute respiratory failure with hypoxia in the setting of COPD and former tobacco use.  She was intubated from April 3 April 6.  Oxygen saturation level 100% on room air.  She continues with increased work of breathing and wheezing in spite of nebs given yesterday. Has had IV fluids last couple of  days but intake and output indicates only 569ml ahead. Only trace LE edema. -Scheduled nebs x3 -prn nebs as well -solumedrol x1 and then po prednisone starting tomorrow -flutter valve -Monitor oxygen saturation level -Mobilize -Incentive spirometry -Supplemental oxygen as indicated  #3.  Hyperkalemia.  Resolved. See meds  #4.  Urinary tract infection.  Pansensitive E. coli noted on her culture.  Blood cultures with no growth to date.  She did complete a 7-day course of Rocephin.  She remains afebrile hemodynamically stable and nontoxic appearing.  She denies any dysuria hematuria frequency or urgency  #5.  Normocytic anemia.  Stable  6.  Diabetes type 2.  Uncontrolled.  Suspect she has got a long history of noncompliance.  Hemoglobin A1c was 8.5 -Sliding scale insulin for optimal control  #7.  Left foot drop/pain and swelling of the left ankle.  X-ray of the left ankle negative.  She did get MRI of the lumbar spine revealing L3-4 bulging and protrusion at L5 1.  Evaluated by neurosurgery who recommended outpatient follow-up and possible EMG.  AFO/dorsiflexion brace provided  #8.  Pressure injury coccyx stage II.   Family Communication/Anticipated D/C date and plan/Code Status   DVT prophylaxis: heparin ordered. Code Status: Full Code.  Family Communication: patient at bedside Disposition Plan: Status is: Inpatient  Remains inpatient appropriate because:Inpatient level of care appropriate due to severity of illness   Dispo: The patient is from: Home              Anticipated d/c is  to: Home              Anticipated d/c date is: > 3 days              Patient currently is not medically stable to d/c.          Medical Consultants:    nephrology   Anti-Infectives:    Completed 7 days rocephin  Subjective:   Sitting on side of bed eating lunch. Denies pain or discomfort. Reports some sob.   Objective:    Vitals:   02/14/20 1936 02/14/20 2131 02/15/20  0540 02/15/20 0856  BP:  (!) 143/59 138/62 (!) 141/66  Pulse:  84 80 67  Resp:  18 18 18   Temp:  98.2 F (36.8 C) 98 F (36.7 C) (!) 97.4 F (36.3 C)  TempSrc:  Oral Oral Oral  SpO2: 98% 95% 97% 100%  Weight:  58.2 kg    Height:        Intake/Output Summary (Last 24 hours) at 02/15/2020 1210 Last data filed at 02/15/2020 0900 Gross per 24 hour  Intake 2788.59 ml  Output 1800 ml  Net 988.59 ml   Filed Weights   02/12/20 2047 02/13/20 0500 02/14/20 2131  Weight: 58.1 kg 58.1 kg 58.2 kg    Exam: General: Awake alert no acute distress CV: Regular rate and rhythm no murmur gallop or rub trace lower extremity edema Respiratory: Mild increased work of breathing with conversation.  Some use of abdominal accessory muscles.  Breath sounds with good air movement  But diffuse wheeze and cough. I hear no crackles or rhonchi Abdomen: Soft positive bowel sounds throughout no guarding or rebounding Musculoskeletal joints without swelling/erythema Neuro alert and oriented x3 speech clear facial symmetry appears to have some short-term memory issues  Data Reviewed:   I have personally reviewed following labs and imaging studies:  Labs: Labs show the following:   Basic Metabolic Panel: Recent Labs  Lab 02/11/20 0748 02/11/20 0748 02/12/20 0537 02/12/20 0537 02/13/20 0405 02/13/20 0405 02/14/20 0327 02/15/20 0333  NA 132*  --  135  --  135  --  137 138  K 5.0   < > 5.8*   < > 5.0   < > 4.7 4.9  CL 103  --  103  --  106  --  107 110  CO2 18*  --  19*  --  15*  --  16* 15*  GLUCOSE 125*  --  104*  --  102*  --  108* 118*  BUN 68*  --  79*  --  80*  --  80* 78*  CREATININE 5.77*  --  6.09*  --  5.71*  --  5.74* 6.04*  CALCIUM 8.2*  --  8.4*  --  8.1*  --  8.0* 7.9*  MG 2.3  --  2.2  --  2.1  --  2.1 1.9  PHOS 6.5*  --  6.8*  --  6.5*  --  6.5* 6.9*   < > = values in this interval not displayed.   GFR Estimated Creatinine Clearance: 6.8 mL/min (A) (by C-G formula based on SCr  of 6.04 mg/dL (H)). Liver Function Tests: Recent Labs  Lab 02/11/20 0748 02/12/20 0537 02/13/20 0405 02/14/20 0327 02/15/20 0333  ALBUMIN 2.7* 2.5* 2.6* 2.5* 2.6*   No results for input(s): LIPASE, AMYLASE in the last 168 hours. No results for input(s): AMMONIA in the last 168 hours. Coagulation profile No results for input(s): INR, PROTIME  in the last 168 hours.  CBC: Recent Labs  Lab 02/12/20 0537  WBC 5.6  HGB 8.1*  HCT 25.7*  MCV 99.6  PLT 191   Cardiac Enzymes: No results for input(s): CKTOTAL, CKMB, CKMBINDEX, TROPONINI in the last 168 hours. BNP (last 3 results) No results for input(s): PROBNP in the last 8760 hours. CBG: Recent Labs  Lab 02/14/20 1121 02/14/20 1659 02/14/20 2130 02/15/20 0639 02/15/20 1129  GLUCAP 117* 151* 139* 110* 151*   D-Dimer: No results for input(s): DDIMER in the last 72 hours. Hgb A1c: No results for input(s): HGBA1C in the last 72 hours. Lipid Profile: No results for input(s): CHOL, HDL, LDLCALC, TRIG, CHOLHDL, LDLDIRECT in the last 72 hours. Thyroid function studies: No results for input(s): TSH, T4TOTAL, T3FREE, THYROIDAB in the last 72 hours.  Invalid input(s): FREET3 Anemia work up: No results for input(s): VITAMINB12, FOLATE, FERRITIN, TIBC, IRON, RETICCTPCT in the last 72 hours. Sepsis Labs: Recent Labs  Lab 02/12/20 0537  WBC 5.6    Microbiology No results found for this or any previous visit (from the past 240 hour(s)).  Procedures and diagnostic studies:  No results found.  Medications:   . amLODipine  10 mg Oral Daily  . Chlorhexidine Gluconate Cloth  6 each Topical Q0600  . Chlorhexidine Gluconate Cloth  6 each Topical Q0600  . darbepoetin (ARANESP) injection - NON-DIALYSIS  100 mcg Subcutaneous Q Thu-1800  . enoxaparin (LOVENOX) injection  30 mg Subcutaneous Q24H  . feeding supplement (NEPRO CARB STEADY)  237 mL Oral TID BM  . insulin aspart  0-5 Units Subcutaneous QHS  . insulin aspart  0-6  Units Subcutaneous TID WC  . ipratropium-albuterol  3 mL Nebulization Q6H  . magnesium oxide  400 mg Oral Daily  . mouth rinse  15 mL Mouth Rinse BID  . methylPREDNISolone (SOLU-MEDROL) injection  80 mg Intravenous Once  . metoprolol tartrate  25 mg Oral BID  . [START ON 02/16/2020] predniSONE  50 mg Oral Q breakfast  . sodium bicarbonate  650 mg Oral BID   Continuous Infusions: . sodium chloride Stopped (01/13/20 0735)     LOS: 19 days   Radene Gunning NP  [facility]   How to contact the [facility] Attending or Consulting provider [place] or covering provider during after hours [place], for this patient?  1. Check the care team in [facility] and look for a) attending/consulting TR[facility] provider listed and b) the [facility] team listed 2. Log into [email or web address] and use [place]'s universal password to access. If you do not have the password, please contact the hospital operator. 3. Locate the [facility] provider you are looking for under [facility] and page to a number that you can be directly reached. 4. If you still have difficulty reaching the provider, please page the [facility] (Director on Call) for the Hospitalists listed on amion for assistance.  02/15/2020, 12:10 PM

## 2020-02-15 NOTE — Progress Notes (Signed)
Patient ID: BRISSIA DELISA, female   DOB: 18-Nov-1945, 74 y.o.   MRN: [id number]    S: No complaints- 2.5 liters of UOP -  crt a little worse but BUN down - had an episode of SOB this AM   O:BP (!) 141/66 (BP Location: Right Arm)   Pulse 67   Temp (!) 97.4 F (36.3 C) (Oral)   Resp 18   Ht 5\' 2"  (1.575 m)   Wt 58.2 kg   SpO2 100%   BMI 23.47 kg/m   Intake/Output Summary (Last 24 hours) at 02/15/2020 1142 Last data filed at 02/15/2020 0900 Gross per 24 hour  Intake 2788.59 ml  Output 1800 ml  Net 988.59 ml   Intake/Output: I/O last 3 completed shifts: In: 4202.6 [P.O.:960; I.V.:3242.6] Out: 4100 [Urine:4100]  Intake/Output this shift:  Total I/O In: 829.5 [P.O.:300; I.V.:292.5; NG/GT:237] Out: 300 [Urine:300] Weight change:  Gen: NAD CVS: RRR, no rub Resp: cta Abd: +BS,soft, NT/ND Ext: no edema  Recent Labs  Lab 02/09/20 0508 02/10/20 0611 02/11/20 0748 02/12/20 0537 02/13/20 0405 02/14/20 0327 02/15/20 0333  NA 134* 134* 132* 135 135 137 138  K 4.1 4.7 5.0 5.8* 5.0 4.7 4.9  CL 103 103 103 103 106 107 110  CO2 18* 21* 18* 19* 15* 16* 15*  GLUCOSE 105* 109* 125* 104* 102* 108* 118*  BUN 58* 64* 68* 79* 80* 80* 78*  CREATININE 5.80* 6.21* 5.77* 6.09* 5.71* 5.74* 6.04*  ALBUMIN 2.4* 2.4* 2.7* 2.5* 2.6* 2.5* 2.6*  CALCIUM 8.3* 8.2* 8.2* 8.4* 8.1* 8.0* 7.9*  PHOS 6.7* 6.7* 6.5* 6.8* 6.5* 6.5* 6.9*   Liver Function Tests: Recent Labs  Lab 02/13/20 0405 02/14/20 0327 02/15/20 0333  ALBUMIN 2.6* 2.5* 2.6*   No results for input(s): LIPASE, AMYLASE in the last 168 hours. No results for input(s): AMMONIA in the last 168 hours. CBC: Recent Labs  Lab 02/12/20 0537  WBC 5.6  HGB 8.1*  HCT 25.7*  MCV 99.6  PLT 191   Cardiac Enzymes: No results for input(s): CKTOTAL, CKMB, CKMBINDEX, TROPONINI in the last 168 hours. CBG: Recent Labs  Lab 02/14/20 1121 02/14/20 1659 02/14/20 2130 02/15/20 0639 02/15/20 1129  GLUCAP 117* 151* 139* 110* 151*     Iron Studies: No results for input(s): IRON, TIBC, TRANSFERRIN, FERRITIN in the last 72 hours. Studies/Results: No results found. [name] amLODipine  10 mg Oral Daily  . Chlorhexidine Gluconate Cloth  6 each Topical Q0600  . Chlorhexidine Gluconate Cloth  6 each Topical Q0600  . darbepoetin (ARANESP) injection - NON-DIALYSIS  100 mcg Subcutaneous Q Thu-1800  . enoxaparin (LOVENOX) injection  30 mg Subcutaneous Q24H  . feeding supplement (NEPRO CARB STEADY)  237 mL Oral TID BM  . insulin aspart  0-5 Units Subcutaneous QHS  . insulin aspart  0-6 Units Subcutaneous TID WC  . ipratropium-albuterol  3 mL Nebulization Q6H  . magnesium oxide  400 mg Oral Daily  . mouth rinse  15 mL Mouth Rinse BID  . metoprolol tartrate  25 mg Oral BID  . sodium bicarbonate  650 mg Oral BID    BMET    Component Value Date/Time   NA 138 02/15/2020 0333   K 4.9 02/15/2020 0333   CL 110 02/15/2020 0333   CO2 15 (L) 02/15/2020 0333   GLUCOSE 118 (H) 02/15/2020 0333   BUN 78 (H) 02/15/2020 0333   CREATININE 6.04 (H) 02/15/2020 0333   CALCIUM 7.9 (L) 02/15/2020 0333   GFRNONAA 6 (L)  02/15/2020 0333   GFRAA 7 (L) 02/15/2020 0333   CBC    Component Value Date/Time   WBC 5.6 02/12/2020 0537   RBC 2.58 (L) 02/12/2020 0537   HGB 8.1 (L) 02/12/2020 0537   HCT 25.7 (L) 02/12/2020 0537   PLT 191 02/12/2020 0537   MCV 99.6 02/12/2020 0537   MCH 31.4 02/12/2020 0537   MCHC 31.5 02/12/2020 0537   RDW 18.6 (H) 02/12/2020 0537   LYMPHSABS 0.9 02/02/2020 0432   MONOABS 0.4 02/02/2020 0432   EOSABS 0.1 02/02/2020 0432   BASOSABS 0.0 02/02/2020 0432     Assessment/Plan:  1. AKI- normal Scr at baseline. Felt to be due to ATN from hypovolemia and hemodynamic insults due to infection but also had nephrotic range proteinuria - underwent renal biopsy on 02/04/20- dense ATN/ acute papillary necrosis. She was started on CRRT 4/3-4/6 then eventually transitioned to IHD on 01/31/20 but last HD on 4/30.  1. Her UOP  hassignificantly improved  2. Crstarted to improve off of dialysis with ivf's but now up again.  Restarted IVF's at 100 per hour - not improved-  dont think she is dry will stop IVF 3.  increase her po intake  4. No uremic symptoms. 5. Continue to hold off on HD for now as it appears she is starting the recovery phase of her ATN.Last HD 4/30 6. Need to seestability or improvement before we can safely remove TDC and prepare for discharge- not quite yet b/c crt worsened overnight.  Will assess day to day -    Will stop IVF and see what numbers look like tomorrow  2. Nephrotic range proteinuria- s/p renal biopsy 02/04/20.Preliminary report was dense ATN and papillary necrosis, no immune mediated process.    3. Hyperkalemia- improved with dose with lokelma and resumed IVF's as it is likely due to poor distal Na delivery due to volume depletion. better- stopped lokelma 4. Metabolic acidosis-  po bicarb   5. Acute hypoxic respiratory failure- due to COPD, intubated early in admission but has done well since. 6. UTI- due to E. Coli 7. DM type II- poorly controlled. 8. Left foot drop- MRI bulging disc at L3-L4 and protrusion L5-S1. To follow up with Neurosurgery as an outpatient.  Louis Meckel  [employer] [phone]

## 2020-02-15 NOTE — Progress Notes (Signed)
After ambulating to the bathroom patient is having some shortness of breath and expiratory wheezing. Patient oxygen saturation is 100% on RA. MD notified. Dyanne Carrel, NP ordered a chest xray and breathing treatments. Orders followed. Will continue to monitor.

## 2020-02-16 ENCOUNTER — Inpatient Hospital Stay (HOSPITAL_COMMUNITY): Payer: Medicare Other

## 2020-02-16 LAB — BASIC METABOLIC PANEL
Anion gap: 14 (ref 5–15)
BUN: 93 mg/dL — ABNORMAL HIGH (ref 8–23)
CO2: 13 mmol/L — ABNORMAL LOW (ref 22–32)
Calcium: 8.8 mg/dL — ABNORMAL LOW (ref 8.9–10.3)
Chloride: 104 mmol/L (ref 98–111)
Creatinine, Ser: 6.35 mg/dL — ABNORMAL HIGH (ref 0.44–1.00)
GFR calc Af Amer: 7 mL/min — ABNORMAL LOW (ref >60)
GFR calc non Af Amer: 6 mL/min — ABNORMAL LOW (ref >60)
Glucose, Bld: 225 mg/dL — ABNORMAL HIGH (ref 70–99)
Potassium: 6.7 mmol/L (ref 3.5–5.1)
Sodium: 131 mmol/L — ABNORMAL LOW (ref 135–145)

## 2020-02-16 LAB — RENAL FUNCTION PANEL
Albumin: 3.1 g/dL — ABNORMAL LOW (ref 3.5–5.0)
Anion gap: 15 (ref 5–15)
BUN: 90 mg/dL — ABNORMAL HIGH (ref 8–23)
CO2: 14 mmol/L — ABNORMAL LOW (ref 22–32)
Calcium: 9.1 mg/dL (ref 8.9–10.3)
Chloride: 103 mmol/L (ref 98–111)
Creatinine, Ser: 6.28 mg/dL — ABNORMAL HIGH (ref 0.44–1.00)
GFR calc Af Amer: 7 mL/min — ABNORMAL LOW (ref >60)
GFR calc non Af Amer: 6 mL/min — ABNORMAL LOW (ref >60)
Glucose, Bld: 212 mg/dL — ABNORMAL HIGH (ref 70–99)
Phosphorus: 7.1 mg/dL — ABNORMAL HIGH (ref 2.5–4.6)
Potassium: >7.5 mmol/L (ref 3.5–5.1)
Sodium: 132 mmol/L — ABNORMAL LOW (ref 135–145)

## 2020-02-16 LAB — BLOOD GAS, ARTERIAL
Acid-base deficit: 10.4 mmol/L — ABNORMAL HIGH (ref 0.0–2.0)
Bicarbonate: 14 mmol/L — ABNORMAL LOW (ref 20.0–28.0)
Drawn by: 560031
FIO2: 21
O2 Saturation: 96.5 %
Patient temperature: 37
pCO2 arterial: 25.3 mmHg — ABNORMAL LOW (ref 32.0–48.0)
pH, Arterial: 7.361 (ref 7.350–7.450)
pO2, Arterial: 84.8 mmHg (ref 83.0–108.0)

## 2020-02-16 LAB — GLUCOSE, CAPILLARY
Glucose-Capillary: 170 mg/dL — ABNORMAL HIGH (ref 70–99)
Glucose-Capillary: 199 mg/dL — ABNORMAL HIGH (ref 70–99)
Glucose-Capillary: 204 mg/dL — ABNORMAL HIGH (ref 70–99)
Glucose-Capillary: 166 mg/dL — ABNORMAL HIGH (ref 70–99)
Glucose-Capillary: 201 mg/dL — ABNORMAL HIGH (ref 70–99)

## 2020-02-16 LAB — POTASSIUM: Potassium: 4.1 mmol/L (ref 3.5–5.1)

## 2020-02-16 LAB — MAGNESIUM: Magnesium: 1.9 mg/dL (ref 1.7–2.4)

## 2020-02-16 MED ORDER — FUROSEMIDE 40 MG PO TABS
40.0000 mg | ORAL_TABLET | Freq: Two times a day (BID) | ORAL | Status: DC
  Administered 2020-02-16 – 2020-02-19 (×6): 40 mg via ORAL
  Filled 2020-02-16 (×6): qty 1

## 2020-02-16 MED ORDER — IPRATROPIUM-ALBUTEROL 0.5-2.5 (3) MG/3ML IN SOLN
3.0000 mL | Freq: Four times a day (QID) | RESPIRATORY_TRACT | Status: DC
  Filled 2020-02-16: qty 3

## 2020-02-16 MED ORDER — CALCIUM GLUCONATE-NACL 1-0.675 GM/50ML-% IV SOLN
1.0000 g | Freq: Once | INTRAVENOUS | Status: DC
  Filled 2020-02-16: qty 50

## 2020-02-16 MED ORDER — SODIUM ZIRCONIUM CYCLOSILICATE 10 G PO PACK
10.0000 g | PACK | Freq: Once | ORAL | Status: AC
  Administered 2020-02-16: 10 g via ORAL
  Filled 2020-02-16: qty 1

## 2020-02-16 MED ORDER — INSULIN ASPART 100 UNIT/ML IV SOLN
5.0000 [IU] | Freq: Once | INTRAVENOUS | Status: AC
  Administered 2020-02-16: 5 [IU] via INTRAVENOUS

## 2020-02-16 MED ORDER — CALCIUM GLUCONATE 10 % IV SOLN
1.0000 g | Freq: Once | INTRAVENOUS | Status: DC
  Filled 2020-02-16: qty 10

## 2020-02-16 MED ORDER — ALBUTEROL SULFATE (2.5 MG/3ML) 0.083% IN NEBU
10.0000 mg | INHALATION_SOLUTION | Freq: Once | RESPIRATORY_TRACT | Status: AC
  Administered 2020-02-16: 10 mg via RESPIRATORY_TRACT
  Filled 2020-02-16: qty 12

## 2020-02-16 MED ORDER — CHLORHEXIDINE GLUCONATE CLOTH 2 % EX PADS: 6.0000 | MEDICATED_PAD | Freq: Every day | CUTANEOUS | Status: DC

## 2020-02-16 MED ORDER — DEXTROSE 50 % IV SOLN: 1.0000 | Freq: Once | INTRAVENOUS | Status: DC

## 2020-02-16 MED ORDER — FUROSEMIDE 10 MG/ML IJ SOLN: 40.0000 mg | Freq: Once | INTRAMUSCULAR | Status: DC

## 2020-02-16 MED ORDER — HEPARIN SODIUM (PORCINE) 1000 UNIT/ML IJ SOLN
INTRAMUSCULAR | Status: AC
  Filled 2020-02-16: qty 4

## 2020-02-16 MED ORDER — METHYLPREDNISOLONE SODIUM SUCC 125 MG IJ SOLR
60.0000 mg | Freq: Two times a day (BID) | INTRAMUSCULAR | Status: DC
  Administered 2020-02-16 – 2020-02-18 (×5): 60 mg via INTRAVENOUS
  Filled 2020-02-16 (×5): qty 2

## 2020-02-16 NOTE — Progress Notes (Signed)
Renal Navigator received notification from Nephrologist/Dr. [name] to refer patient for AKI OP HD treatment. Navigator met with patient to discuss. Referral made to [facility] as patient will need to utilize [facility] Transportation until she can get some things in order and re-instate her car insurance (lapsed since she has been in the hospital for over a month). Navigator provided patient with phone number for [place] to call (encouraged her to call tomorrow) to enroll in [employer]. Navigator will follow closely and follow up with Nephrologist and patient once a seat schedule has been secured.  Alphonzo Cruise, [place] Renal Navigator [phone]

## 2020-02-16 NOTE — Progress Notes (Signed)
RN noticed that patients work of breathing has increased from the beginning of the shift. Oxygen saturation on room air 99-100%. Order for continuous pulse oximetry placed, RN implemented. Labs drawn and potassium is critical at 7.5. RN notified MD, [name]. Orders placed for insulin, vitals Q1, STAT EKG, D50% 92mls, calcium gluconate IV, IV lasix inj, STAT chest x-ray, and breathing treatment (albuterol).   Vitals: temp-97.6, BP-167/69, HR-82, RR-30, O2-100% on room air.  [name] at bedside. Transfer orders placed to progressive floor.  RN unable to administer D50%, calcium gluconate, or lasix due to loss of IV access. RN notified IV team to obtain access.  Repeat for potassium obtained, with a result of 6.7. RN notified [name] and Nephrology MD, [name].   [name] placed order in for emergent HD for 2 hours and is aware that RN was unable to administer medications. Transfer order placed on hold until status of patient is reassessed after HD treatment.   RN will continue to monitor patient. [name], RN

## 2020-02-16 NOTE — Progress Notes (Addendum)
[facility] Progress Note    Assessment/ Plan:   1. AKI - baseline Cr WNL at 0.92 in 07/2019.  Felt to be due to ATN from hypovolemia and hemodynamic insults due to infection but also had nephrotic range proteinuria - underwent renal biopsy on 02/04/20- dense ATN/ acute papillary necrosis. She was started on CRRT 4/3-4/6 then eventually transitioned to IHD on 01/31/20 but last HD on 4/30.  1. UOP continues to be improved, at 1.9L in last 24 hrs. 2. Cr worsening again today, 6.04>6.28, GFR stable at 6. 3.  Cont to increase PO intake - took 840cc in last 24 hrs. 4. No uremic symptoms. 5. Continue to hold off on HD for now as it appears she is starting the recovery phase of her ATN.Last HD 4/30 6. Need to seestability or improvement before we can safely remove [facility] and prepare for discharge.  So this potassium issue as well as volume overload I think tells [place] that she is not safe to leave the hospital off of HD-  Will need to look and see if can get an AKI spot for her as OP 2. Nephrotic range proteinuria- s/p renal biopsy 02/04/20.Preliminary report was dense ATN and papillary necrosis, no immune mediated process.  3. Hyperkalemia- improved with dose of lokelma and resumed IVF's as was likely due to poor distal Na delivery due to volume depletion.  No longer on IV fluids or lokelma, K today >7.5.  Confirmed with nurse to give albuterol treatment.  Will order EKG and lokelma 10g.  Also receiving Lasix 40mg  PO BID.  Given calcium gluconate per primary.  Will recheck now and if remains high, may need HD. 4. Metabolic acidosis-  po bicarb failing   5. Acute hypoxic respiratory failure- due to COPD, intubated early in admission but has done well since. On solumedrol.  Asked nurse to give albuterol nebs given wheezing.  Will order Lasix 40mg  PO BID. 6. UTI- due to E. Coli, s/p 7d CTX 7. DM type II- poorly controlled. 8. Left foot drop- MRI bulging disc at L3-L4 and protrusion L5-S1. To  follow up with Neurosurgery as an outpatient.  Patient seen and examined, agree with above note with above modifications. Even though making urine- crt climbing and high K today as well as metabolic acidosis-  I think declaring herself for now to be HD dep-  Will do HD today and try to secure OP AKI spot so that she can leave the hospital at some point  Corliss Parish, MD 02/16/2020     Subjective:   UOP 1.9 L in last 24 hrs.  No acute events overnight. Endorses some mild left hand swelling, states breathing is improving and continues to urinate.  When seen later seems that WOB is up.  Interestingly k came back at over 7, then 6.7 associated with higher crt again -  Will do a short HD treatment today    Objective:   BP (!) 154/42 (BP Location: Right Arm)   Pulse 83   Temp 97.8 F (36.6 C) (Oral)   Resp 18   Ht 5\' 2"  (1.575 m)   Wt 58.2 kg   SpO2 98%   BMI 23.47 kg/m   Intake/Output Summary (Last 24 hours) at 02/16/2020 0844 Last data filed at 02/16/2020 0601 Gross per 24 hour  Intake 1309.94 ml  Output 1900 ml  Net -590.06 ml   Weight change:   Physical Exam: Gen: in NAD CVS:RRR Resp:end expiratory wheezing throughout all lung fields  Abd: soft, non-tender WFU:XNATF BLE edema  Imaging: DG Chest 2 View  Result Date: 02/15/2020 CLINICAL DATA:  74 year old female with wheezing. In stage renal disease on dialysis. EXAM: CHEST - 2 VIEW COMPARISON:  Portable chest 01/08/2020 and earlier. FINDINGS: Semi upright AP and lateral views of the chest. Extubated and enteric tube removed since last month. Dual lumen dialysis catheter now from a right IJ approach. Chronic postoperative changes to the left chest wall. Dependent pulmonary opacity compatible with small bilateral pleural effusions. Some pleural fluid tracking into the fissures on the lateral view. Superimposed bilateral increased interstitial opacity with basilar predominance, and slightly greater on the left. No  pneumothorax. Mediastinal contours are within normal limits. Visualized tracheal air column is within normal limits. Calcified aortic atherosclerosis. No acute osseous abnormality identified. Negative visible bowel gas pattern. IMPRESSION: 1. Small bilateral pleural effusions. 2. Bilateral basilar predominant increased interstitial opacity. Favor interstitial edema over viral/atypical respiratory infection. Electronically Signed   By: Genevie Ann M.D.   On: 02/15/2020 14:05    Labs: BMET Recent Labs  Lab 02/10/20 5732 02/10/20 2025 02/11/20 0748 02/12/20 0537 02/13/20 0405 02/14/20 0327 02/15/20 0333 02/16/20 1016 02/16/20 1131  NA 134*   < > 132* 135 135 137 138 132* 131*  K 4.7   < > 5.0 5.8* 5.0 4.7 4.9 >7.5* 6.7*  CL 103   < > 103 103 106 107 110 103 104  CO2 21*   < > 18* 19* 15* 16* 15* 14* 13*  GLUCOSE 109*   < > 125* 104* 102* 108* 118* 212* 225*  BUN 64*   < > 68* 79* 80* 80* 78* 90* 93*  CREATININE 6.21*   < > 5.77* 6.09* 5.71* 5.74* 6.04* 6.28* 6.35*  CALCIUM 8.2*   < > 8.2* 8.4* 8.1* 8.0* 7.9* 9.1 8.8*  PHOS 6.7*  --  6.5* 6.8* 6.5* 6.5* 6.9* 7.1*  --    < > = values in this interval not displayed.   CBC Recent Labs  Lab 02/12/20 0537  WBC 5.6  HGB 8.1*  HCT 25.7*  MCV 99.6  PLT 191    Medications:    . amLODipine  10 mg Oral Daily  . Chlorhexidine Gluconate Cloth  6 each Topical Q0600  . Chlorhexidine Gluconate Cloth  6 each Topical Q0600  . darbepoetin (ARANESP) injection - NON-DIALYSIS  100 mcg Subcutaneous Q Thu-1800  . enoxaparin (LOVENOX) injection  30 mg Subcutaneous Q24H  . feeding supplement (NEPRO CARB STEADY)  237 mL Oral TID BM  . insulin aspart  0-5 Units Subcutaneous QHS  . insulin aspart  0-6 Units Subcutaneous TID WC  . magnesium oxide  400 mg Oral Daily  . mouth rinse  15 mL Mouth Rinse BID  . metoprolol tartrate  25 mg Oral BID  . predniSONE  50 mg Oral Q breakfast  . sodium bicarbonate  650 mg Oral BID     [place] Constable, DO [facility]  Family Medicine Resident, PGY 2 02/16/2020, 8:44 AM

## 2020-02-16 NOTE — Progress Notes (Signed)
OT Cancellation Note  Patient Details Name: Cindy Burns MRN: [id number] DOB: [birth year]   Cancelled Treatment:    Reason Eval/Treat Not Completed: Patient at procedure or test/ unavailable(HD)  Malka So 02/16/2020, 2:33 PM  Nestor Lewandowsky, OTR/L Acute Rehabilitation Services Pager: [phone] Office: [phone]

## 2020-02-16 NOTE — Progress Notes (Signed)
Notified of K of greater than 7.5-  Would be unusual b/c was 4.9 yesterday and pt making urine.  Has been treated with insulin/d50/ albuterol/lasix and lokelma.  Will be rechecked.  If still up may need a short dialysis treatment later today   Louis Meckel

## 2020-02-16 NOTE — Procedures (Signed)
Patient was seen on dialysis and the procedure was supervised.  BFR 350  Via TDC BP is  163/75.   Patient appears to be tolerating treatment well  Louis Meckel 02/16/2020

## 2020-02-16 NOTE — Progress Notes (Signed)
PT Cancellation Note  Patient Details Name: NAOME BRIGANDI MRN: [id number] DOB: [birth year]   Cancelled Treatment:    Reason Eval/Treat Not Completed: Medical issues which prohibited therapy   High Potassium, going to HD emergently;   Will follow,  Roney Marion, PT  Acute Rehabilitation Services Pager [phone] Office [place] 02/16/2020, 1:34 PM

## 2020-02-16 NOTE — Progress Notes (Signed)
CRITICAL LAB VALUE Potassium: 7.5  [name], MD, notified. Awaiting new orders or call back.  [name], RN

## 2020-02-16 NOTE — Progress Notes (Signed)
Nutrition Follow-up  DOCUMENTATION CODES:   Not applicable  INTERVENTION:  Continue Nepro Shake po TID, each supplement provides 425 kcal and 19 grams protein   NUTRITION DIAGNOSIS:   Inadequate oral intake related to inability to eat as evidenced by NPO status.  Progressing, pt now on renal/carb modified diet  GOAL:   Patient will meet greater than or equal to 90% of their needs  Progressing.  MONITOR:   PO intake, Supplement acceptance, Skin  REASON FOR ASSESSMENT:   Ventilator, Consult Enteral/tube feeding initiation and management  ASSESSMENT:   74 yo female admitted with respiratory failure, DKA, UTI, AKI. Intubated on admission. PMH includes COPD, DM, breast cancer.  4/3 intubated, CRRT initiated 4/6 extubated, CRRT stopped 4/27 iHD initiated  4/30 nephrotic range proteinuria - s/p renal biopsy  Pt reports appetite is still good and that she is still enjoying the Nepro shakes, which she is receiving TID.   PO Intake: 50-100% x last 8 recorded meals (87% average meal intake)  UOP: 1,953m x24 hours I/O: +2,534.23ml since admit  Last HD: 5/12 Net UF 1,080ml Labs: Na 131 (L), K+ 6.7 (H) CBGs [phone]  Medications reviewed and include: Lasix, Novolog, Aranesp, Mag-ox, Solu-medrol, sodium bicarbonate  Diet Order:   Diet Order            Diet renal/carb modified with fluid restriction Diet-HS Snack? Nothing; Fluid restriction: Other (see comments); Room service appropriate? Yes; Fluid consistency: Thin  Diet effective now              EDUCATION NEEDS:   Not appropriate for education at this time  Skin:  Skin Assessment: Skin Integrity Issues: Skin Integrity Issues:: Incisions, Stage I DTI: L heel Stage I: coccyx Stage II: coccyx Incisions: R back Other: wound to L great toe  Last BM:  5/11  Height:   Ht Readings from Last 1 Encounters:  01/17/20 5\' 2"  (1.575 m)    Weight:   Wt Readings from Last 1 Encounters:  02/16/20 63 kg     BMI:  Body mass index is 25.42 kg/m.  Estimated Nutritional Needs:   Kcal:  1400-1600  Protein:  80-90 gm  Fluid:  >/= 1.5 L    Larkin Ina, MS, RD, LDN RD pager number and weekend/on-call pager number located in [place].

## 2020-02-16 NOTE — Progress Notes (Signed)
PROGRESS NOTE  Cindy Burns XAJ:[id number] DOB: [birth year] DOA: 01/08/2020 PCP: [name], MD  HPI/Recap of past 24 hours: HPI from Dr Magnus Sinning is an 74 y.o. female with a past medical history significant for breast cancer, tobacco use, diabetes, COPD not on home oxygen, MDD admitted to the hospital April 3 with acute renal failure requiring CRRT, hyperkalemia, anion gap metabolic acidosis secondary to DKA, acute respiratory failure requiring intubation from April 3 April 6.  She has had persistent ongoing kidney failure.  She is being followed by nephrology.  Dialysis was started on April 27.  She underwent a renal biopsy April 30 revealing ATN.  Currently in diuresis phase of recovery.  Evaluated daily by nephrology.    Today, patient was noted to be significantly short of breath, with wheezing, increased work of breathing, with chest tightness.  Patient denies any abdominal pain, nausea/vomiting, fever/chills.   Assessment/Plan: Principal Problem:   Renal failure Active Problems:   CIGARETTE SMOKER   Hypertension   Pressure injury of skin   Encounter for central line placement   Pain and swelling of ankle, left   Hypocalcemia   Metabolic acidosis   Acute respiratory failure with hypoxia (HCC)   UTI (urinary tract infection)   Acute hypoxic respiratory failure likely 2/2 vol overload in addition to possible COPD exacerbation Noted to have increased work of breathing Likely 2/2 volume overload Chest x-ray with volume overload Scheduled duo nebs, Solu-Medrol Nephrology consulted, status post urgent dialysis on 02/16/2020 Continue Lasix Supplemental oxygen as needed, flutter valve, incentive spirometry  Acute kidney injury 2/2 severe volume depletion in the setting of DKA, UTI/metabolic acidosis Dialysis started April 27, required urgent dialysis on 02/16/2020 due to volume overload Status post renal biopsy by interventional radiology April 30 Preliminary path  report per nephrology shows ATN Management per nephrology  Hyperkalemia >7.5-->6.7-->4.1 (after HD) Improved s/p HD Daily BMP  UTI Pansensitive E. coli noted on her culture Blood cultures with no growth to date S/p 7-day of Rocephin  Normocytic anemia Stable Daily CBC  Diabetes mellitus type 2  Hemoglobin A1c was 8.5, uncontrolled SSI, Accu-Cheks, hypoglycemic protocol  Left foot drop/pain and swelling of the left ankle X-ray of the left ankle negative. She did get MRI of the lumbar spine revealing L3-4 bulging and protrusion at L5 1. Evaluated by neurosurgery who recommended outpatient follow-up and possible EMG. AFO/dorsiflexion brace provided  Pressure injury coccyx stage II       Malnutrition Type:  Nutrition Problem: Inadequate oral intake Etiology: inability to eat   Malnutrition Characteristics:  Signs/Symptoms: NPO status   Nutrition Interventions:  Interventions: Tube feeding, MVI    Estimated body mass index is 25.04 kg/m as calculated from the following:   Height as of this encounter: 5\' 2"  (1.575 m).   Weight as of this encounter: 62.1 kg.     Code Status: Full  Family Communication: Discussed with patient  Disposition Plan: Status is: Inpatient  Remains inpatient appropriate because:Inpatient level of care appropriate due to severity of illness   Dispo: The patient is from: Home              Anticipated d/c is to: Home              Anticipated d/c date is: 3 days              Patient currently is not medically stable to d/c.    Consultants:  Nephrology   Procedures:  None  Antimicrobials:  None  DVT prophylaxis: Lovenox   Objective: Vitals:   02/16/20 1500 02/16/20 1530 02/16/20 1549 02/16/20 1655  BP: (!) 164/72 (!) 181/75 (!) 174/66 (!) 170/71  Pulse: 94 94 94 (!) 103  Resp: (!) 23 (!) 22 (!) 22 18  Temp:   98.5 F (36.9 C) 98.4 F (36.9 C)  TempSrc:   Oral Oral  SpO2:   100% 97%  Weight:   62.1  kg   Height:        Intake/Output Summary (Last 24 hours) at 02/16/2020 1748 Last data filed at 02/16/2020 1549 Gross per 24 hour  Intake 420 ml  Output 2200 ml  Net -1780 ml   Filed Weights   02/14/20 2131 02/16/20 1329 02/16/20 1549  Weight: 58.2 kg 63 kg 62.1 kg    Exam:  General: Increased WOB  Cardiovascular: S1, S2 present  Respiratory: Diminished BS b/l  Abdomen: Soft, nontender, nondistended, bowel sounds present  Musculoskeletal: No bilateral pedal edema noted  Skin: Normal  Psychiatry: Normal mood   Data Reviewed: CBC: Recent Labs  Lab 02/12/20 0537  WBC 5.6  HGB 8.1*  HCT 25.7*  MCV 99.6  PLT 732   Basic Metabolic Panel: Recent Labs  Lab 02/12/20 0537 02/12/20 0537 02/13/20 0405 02/13/20 0405 02/14/20 0327 02/15/20 0333 02/16/20 1016 02/16/20 1131 02/16/20 1648  NA 135   < > 135  --  137 138 132* 131*  --   K 5.8*   < > 5.0   < > 4.7 4.9 >7.5* 6.7* 4.1  CL 103   < > 106  --  107 110 103 104  --   CO2 19*   < > 15*  --  16* 15* 14* 13*  --   GLUCOSE 104*   < > 102*  --  108* 118* 212* 225*  --   BUN 79*   < > 80*  --  80* 78* 90* 93*  --   CREATININE 6.09*   < > 5.71*  --  5.74* 6.04* 6.28* 6.35*  --   CALCIUM 8.4*   < > 8.1*  --  8.0* 7.9* 9.1 8.8*  --   MG 2.2  --  2.1  --  2.1 1.9 1.9  --   --   PHOS 6.8*  --  6.5*  --  6.5* 6.9* 7.1*  --   --    < > = values in this interval not displayed.   GFR: Estimated Creatinine Clearance: 7 mL/min (A) (by C-G formula based on SCr of 6.35 mg/dL (H)). Liver Function Tests: Recent Labs  Lab 02/12/20 0537 02/13/20 0405 02/14/20 0327 02/15/20 0333 02/16/20 1016  ALBUMIN 2.5* 2.6* 2.5* 2.6* 3.1*   No results for input(s): LIPASE, AMYLASE in the last 168 hours. No results for input(s): AMMONIA in the last 168 hours. Coagulation Profile: No results for input(s): INR, PROTIME in the last 168 hours. Cardiac Enzymes: No results for input(s): CKTOTAL, CKMB, CKMBINDEX, TROPONINI in the last 168  hours. BNP (last 3 results) No results for input(s): PROBNP in the last 8760 hours. HbA1C: No results for input(s): HGBA1C in the last 72 hours. CBG: Recent Labs  Lab 02/15/20 2150 02/16/20 0646 02/16/20 1115 02/16/20 1306 02/16/20 1621  GLUCAP 234* 170* 199* 204* 166*   Lipid Profile: No results for input(s): CHOL, HDL, LDLCALC, TRIG, CHOLHDL, LDLDIRECT in the last 72 hours. Thyroid Function Tests: No results for input(s): TSH, T4TOTAL, FREET4, T3FREE, THYROIDAB in the  last 72 hours. Anemia Panel: No results for input(s): VITAMINB12, FOLATE, FERRITIN, TIBC, IRON, RETICCTPCT in the last 72 hours. Urine analysis:    Component Value Date/Time   COLORURINE YELLOW 02/13/2020 0037   APPEARANCEUR HAZY (A) 02/13/2020 0037   LABSPEC 1.004 (L) 02/13/2020 0037   PHURINE 7.0 02/13/2020 0037   GLUCOSEU NEGATIVE 02/13/2020 0037   HGBUR NEGATIVE 02/13/2020 0037   HGBUR trace-intact 06/24/2008 0912   BILIRUBINUR NEGATIVE 02/13/2020 0037   BILIRUBINUR negative 09/23/2013 1559   KETONESUR NEGATIVE 02/13/2020 0037   PROTEINUR NEGATIVE 02/13/2020 0037   UROBILINOGEN 0.2 09/23/2013 1559   UROBILINOGEN 0.2 06/24/2008 0912   NITRITE NEGATIVE 02/13/2020 0037   LEUKOCYTESUR LARGE (A) 02/13/2020 0037   Sepsis Labs: @LABRCNTIP (procalcitonin:4,lacticidven:4)  )No results found for this or any previous visit (from the past 240 hour(s)).    Studies: DG Chest Port 1 View  Result Date: 02/16/2020 CLINICAL DATA:  Shortness of breath EXAM: PORTABLE CHEST 1 VIEW COMPARISON:  02/15/2020 FINDINGS: Right IJ hemodialysis catheter, stable in positioning terminating at the level of the right atrium. Stable mild cardiomegaly. Atherosclerotic calcification of the aortic knob. Small bilateral pleural effusions, left greater than right. Persistent bibasilar interstitial opacities, similar to prior. No pneumothorax. IMPRESSION: No significant interval change. Small bilateral pleural effusions, left greater than  right. Persistent bibasilar interstitial opacities. Electronically Signed   By: Davina Poke D.O.   On: 02/16/2020 11:59    Scheduled Meds: . amLODipine  10 mg Oral Daily  . Chlorhexidine Gluconate Cloth  6 each Topical Q0600  . Chlorhexidine Gluconate Cloth  6 each Topical Q0600  . [START ON 02/17/2020] Chlorhexidine Gluconate Cloth  6 each Topical Q0600  . darbepoetin (ARANESP) injection - NON-DIALYSIS  100 mcg Subcutaneous Q Thu-1800  . dextrose  1 ampule Intravenous Once  . enoxaparin (LOVENOX) injection  30 mg Subcutaneous Q24H  . feeding supplement (NEPRO CARB STEADY)  237 mL Oral TID BM  . furosemide  40 mg Intravenous Once  . furosemide  40 mg Oral BID  . heparin sodium (porcine)      . insulin aspart  0-5 Units Subcutaneous QHS  . insulin aspart  0-6 Units Subcutaneous TID WC  . magnesium oxide  400 mg Oral Daily  . mouth rinse  15 mL Mouth Rinse BID  . methylPREDNISolone (SOLU-MEDROL) injection  60 mg Intravenous Q12H  . metoprolol tartrate  25 mg Oral BID  . sodium bicarbonate  650 mg Oral BID    Continuous Infusions: . sodium chloride Stopped (01/13/20 0735)  . calcium gluconate       LOS: 39 days     Alma Friendly, MD [facility]  If 7PM-7AM, please contact night-coverage [email or web address] 02/16/2020, 5:48 PM

## 2020-02-17 ENCOUNTER — Other Ambulatory Visit: Payer: Self-pay | Admitting: Family Medicine

## 2020-02-17 LAB — CBC WITH DIFFERENTIAL/PLATELET
Abs Immature Granulocytes: 0.17 10*3/uL — ABNORMAL HIGH (ref 0.00–0.07)
Basophils Absolute: 0 10*3/uL (ref 0.0–0.1)
Basophils Relative: 0 %
Eosinophils Absolute: 0 10*3/uL (ref 0.0–0.5)
Eosinophils Relative: 0 %
HCT: 29.1 % — ABNORMAL LOW (ref 36.0–46.0)
Hemoglobin: 9 g/dL — ABNORMAL LOW (ref 12.0–15.0)
Immature Granulocytes: 1 %
Lymphocytes Relative: 6 %
Lymphs Abs: 0.9 10*3/uL (ref 0.7–4.0)
MCH: 31.6 pg (ref 26.0–34.0)
MCHC: 30.9 g/dL (ref 30.0–36.0)
MCV: 102.1 fL — ABNORMAL HIGH (ref 80.0–100.0)
Monocytes Absolute: 0.2 10*3/uL (ref 0.1–1.0)
Monocytes Relative: 2 %
Neutro Abs: 13.1 10*3/uL — ABNORMAL HIGH (ref 1.7–7.7)
Neutrophils Relative %: 91 %
Platelets: 226 10*3/uL (ref 150–400)
RBC: 2.85 MIL/uL — ABNORMAL LOW (ref 3.87–5.11)
RDW: 19.5 % — ABNORMAL HIGH (ref 11.5–15.5)
WBC: 14.4 10*3/uL — ABNORMAL HIGH (ref 4.0–10.5)
nRBC: 0.3 % — ABNORMAL HIGH (ref 0.0–0.2)

## 2020-02-17 LAB — GLUCOSE, CAPILLARY
Glucose-Capillary: 198 mg/dL — ABNORMAL HIGH (ref 70–99)
Glucose-Capillary: 222 mg/dL — ABNORMAL HIGH (ref 70–99)
Glucose-Capillary: 206 mg/dL — ABNORMAL HIGH (ref 70–99)
Glucose-Capillary: 315 mg/dL — ABNORMAL HIGH (ref 70–99)

## 2020-02-17 LAB — RENAL FUNCTION PANEL
Albumin: 3.2 g/dL — ABNORMAL LOW (ref 3.5–5.0)
Anion gap: 14 (ref 5–15)
BUN: 55 mg/dL — ABNORMAL HIGH (ref 8–23)
CO2: 20 mmol/L — ABNORMAL LOW (ref 22–32)
Calcium: 8.9 mg/dL (ref 8.9–10.3)
Chloride: 99 mmol/L (ref 98–111)
Creatinine, Ser: 4.06 mg/dL — ABNORMAL HIGH (ref 0.44–1.00)
GFR calc Af Amer: 12 mL/min — ABNORMAL LOW (ref >60)
GFR calc non Af Amer: 10 mL/min — ABNORMAL LOW (ref >60)
Glucose, Bld: 231 mg/dL — ABNORMAL HIGH (ref 70–99)
Phosphorus: 6.1 mg/dL — ABNORMAL HIGH (ref 2.5–4.6)
Potassium: 4.8 mmol/L (ref 3.5–5.1)
Sodium: 133 mmol/L — ABNORMAL LOW (ref 135–145)

## 2020-02-17 LAB — MAGNESIUM: Magnesium: 2 mg/dL (ref 1.7–2.4)

## 2020-02-17 MED ORDER — BUDESONIDE 0.25 MG/2ML IN SUSP
0.2500 mg | Freq: Two times a day (BID) | RESPIRATORY_TRACT | Status: DC
  Administered 2020-02-17 – 2020-02-18 (×4): 0.25 mg via RESPIRATORY_TRACT
  Filled 2020-02-17 (×4): qty 2

## 2020-02-17 MED ORDER — INSULIN ASPART 100 UNIT/ML ~~LOC~~ SOLN
3.0000 [IU] | Freq: Three times a day (TID) | SUBCUTANEOUS | Status: DC
  Administered 2020-02-17 – 2020-02-19 (×5): 3 [IU] via SUBCUTANEOUS

## 2020-02-17 MED ORDER — CHLORHEXIDINE GLUCONATE CLOTH 2 % EX PADS: 6.0000 | MEDICATED_PAD | Freq: Every day | CUTANEOUS | Status: DC

## 2020-02-17 MED ORDER — IPRATROPIUM-ALBUTEROL 0.5-2.5 (3) MG/3ML IN SOLN
3.0000 mL | Freq: Four times a day (QID) | RESPIRATORY_TRACT | Status: DC
  Administered 2020-02-17 (×3): 3 mL via RESPIRATORY_TRACT
  Filled 2020-02-17 (×3): qty 3

## 2020-02-17 MED ORDER — IPRATROPIUM-ALBUTEROL 0.5-2.5 (3) MG/3ML IN SOLN
3.0000 mL | Freq: Three times a day (TID) | RESPIRATORY_TRACT | Status: DC
  Administered 2020-02-18 (×2): 3 mL via RESPIRATORY_TRACT
  Filled 2020-02-17 (×2): qty 3
  Filled 2020-02-17: qty 39

## 2020-02-17 NOTE — TOC Progression Note (Addendum)
Transition of Care [facility]) - Progression Note    Patient Details  Name: Cindy Burns MRN: [id number] Date of Birth: 05/04/1946  [facility] CM/SW Contact  Bartholomew Crews, RN Phone Number: [phone] 02/17/2020, 2:18 PM  Clinical Narrative:     Spoke with patient at the bedside.   Patient has contacted Medicaid transportation which is set up. She will contact them to schedule her dialysis transportation once her seat is assigned.   Patient asked about a rollator per PT recommendations. DME order received. Referral sent to [facility] for rollator and previously ordered 3N1.   Spoke with [place] at [facility] about [place]. Patient is still in the system, but needed home care agency names to send PCS referral d/t previous selected agencies declined the referral.   Spoke with Cindi at [place] about previous referral for [facility] PT and OT. Patient will need HH orders for PT and OT with [facility] at discharge.    [facility] referral placed for community support after lengthy hospitalization.   Patient states that her friend [employer] will pick her up when she is ready to transition home.   TOC following for transition needs.   Expected Discharge Plan: [place] Barriers to Discharge: Continued Medical Work up  Expected Discharge Plan and Services Expected Discharge Plan: [place] In-house Referral: Clinical Social Work, [facility] Discharge Planning Services: CM Consult Post Acute Care Choice: Durable Medical Equipment, Home Health Living arrangements for the past 2 months: Single Family Home                 DME Arranged: 3-N-1, [place] rolling with seat DME Agency: [employer] Date DME Agency Contacted: 02/17/20 Time DME Agency Contacted: 69 Representative spoke with at DME Agency: Thedore Mins HH Arranged: PT, OT [place] Agency: [place] Date [place]: 02/17/20 Time [place]: [place] Representative spoke with at  [employer]: Cindi   Social Determinants of Health ([place]) Interventions    Readmission Risk Interventions No flowsheet data found.

## 2020-02-17 NOTE — Plan of Care (Signed)
  Problem: Elimination: Goal: Will not experience complications related to bowel motility Outcome: Progressing   

## 2020-02-17 NOTE — Progress Notes (Signed)
PT Cancellation Note  Patient Details Name: Cindy Burns MRN: [id number] DOB: 1945/12/11   Cancelled Treatment:    Reason Eval/Treat Not Completed: Other (comment). Pt on the phone with the social services office and requesting that therapist return at a later time. PT will continue to f/u with pt as available.    [place] 02/17/2020, 1:20 PM

## 2020-02-17 NOTE — Progress Notes (Addendum)
[facility] Progress Note    Assessment/ Plan:   Cindy Burns is a 74 y.o. female with PMH breast cancer, tobacco use, DM, COPD, MDD, admitted 4/3 in setting of ARF, DKA, and acute respiratory failure requiring intubation and CRRT.  Transitioned to IHD and held after session on 4/30.  On 5/12 had worsening renal function and hyperkalemia with volume overload, therefore HD initiated.  Will require outpatient HD for AKI.  1. AKI - baseline Cr WNL at 0.92 in 07/2019.  Felt to be due to ATN from hypovolemia and hemodynamic insults due to infection but also had nephrotic range proteinuria - underwent renal biopsy on 02/04/20- dense ATN/ acute papillary necrosis. She was started on CRRT 4/3-4/6 then eventually transitioned to IHD on 01/31/20 and had session on 4/30.  Cr oscillating, but on 5/12 developed hyperkalemia with worsening Cr in setting of volume overload requiring emergent HD.  1. UOP 700cc with 1 unmeasured.   2. Cr improved today after HD, 6.35>4.06.  1L removed. 3. Work to continue increase in PO intake. 4. Patient with hyperkalemia and volume overload on 5/12 declaring need for outpatient HD for AKI.  Renal Navigator working on seat and transportation. 5. Plan for next HD session 5/14 or 5/15 pending HD schedule. 2. Nephrotic range proteinuria- s/p renal biopsy 02/04/20.Preliminary report was dense ATN and papillary necrosis, no immune mediated process.  3. Hyperkalemia- improved after HD, K 4.8 this AM.  Will continue to require IHD as outpatient for AKI.  Plan per above. 4. Metabolic acidosis-  On PO bicarb.  Also improved with HD< CO2 13>20.  While on HD likely will not need supplemental bicarb   5. Macrocytic Anemia- Anemia panel from 4/23, iron 84, TIBC 204, sat ratios 41, ferritin 418.  Hgb 9.0, MCV 102.1.  Currently on ESA. 6. Acute hypoxic respiratory failure- due to COPD, intubated early in admission but has done well since. On solumedrol.  Started on Lasix 40mg   PO BID on 5/12 and had HD treatment.  Reports improvement in breathing this AM.  Continue IHD and Lasix.   7. UTI- due to E. Coli, s/p 7d CTX 8. DM type II- poorly controlled. 9. Left foot drop- MRI bulging disc at L3-L4 and protrusion L5-S1. To follow up with Neurosurgery as an outpatient.   Subjective:   UOP 700cc with 1 unmeasured occurrence in last 24 hrs.  Had worsening in respiratory status, hyperkalemia, and Cr on 5/12 requiring emergent HD.  This AM reports improvement in her breathing.  States that she felt a little sick after HD, but now feeling improved.  Having some swelling in her left arm, thinks that when she is laying, she is mostly putting weight on her left side.  Looks better after HD   Objective:   BP (!) 156/89   Pulse 81   Temp 97.7 F (36.5 C) (Oral)   Resp 18   Ht 5\' 2"  (1.575 m)   Wt 62.2 kg   SpO2 99%   BMI 25.08 kg/m   Intake/Output Summary (Last 24 hours) at 02/17/2020 0854 Last data filed at 02/17/2020 0800 Gross per 24 hour  Intake 420 ml  Output 2200 ml  Net -1780 ml   Weight change:   Physical Exam: General: 74 y.o. female in NAD Cardio: RRR no m/r/g Lungs: end expiratory wheezing, good air movement throughout, no increased WOB Abdomen: Soft, non-tender to palpation Skin: warm and dry Extremities: Trace BLE edema, trace LUE edema near elbow  Imaging: DG Chest 2 View  Result Date: 02/15/2020 CLINICAL DATA:  74 year old female with wheezing. In stage renal disease on dialysis. EXAM: CHEST - 2 VIEW COMPARISON:  Portable chest 01/08/2020 and earlier. FINDINGS: Semi upright AP and lateral views of the chest. Extubated and enteric tube removed since last month. Dual lumen dialysis catheter now from a right IJ approach. Chronic postoperative changes to the left chest wall. Dependent pulmonary opacity compatible with small bilateral pleural effusions. Some pleural fluid tracking into the fissures on the lateral view. Superimposed bilateral increased  interstitial opacity with basilar predominance, and slightly greater on the left. No pneumothorax. Mediastinal contours are within normal limits. Visualized tracheal air column is within normal limits. Calcified aortic atherosclerosis. No acute osseous abnormality identified. Negative visible bowel gas pattern. IMPRESSION: 1. Small bilateral pleural effusions. 2. Bilateral basilar predominant increased interstitial opacity. Favor interstitial edema over viral/atypical respiratory infection. Electronically Signed   By: Genevie Ann M.D.   On: 02/15/2020 14:05   DG Chest Port 1 View  Result Date: 02/16/2020 CLINICAL DATA:  Shortness of breath EXAM: PORTABLE CHEST 1 VIEW COMPARISON:  02/15/2020 FINDINGS: Right IJ hemodialysis catheter, stable in positioning terminating at the level of the right atrium. Stable mild cardiomegaly. Atherosclerotic calcification of the aortic knob. Small bilateral pleural effusions, left greater than right. Persistent bibasilar interstitial opacities, similar to prior. No pneumothorax. IMPRESSION: No significant interval change. Small bilateral pleural effusions, left greater than right. Persistent bibasilar interstitial opacities. Electronically Signed   By: Davina Poke D.O.   On: 02/16/2020 11:59    Labs: BMET Recent Labs  Lab 02/11/20 0175 02/11/20 0748 02/12/20 0537 02/12/20 0537 02/13/20 0405 02/14/20 0327 02/15/20 0333 02/16/20 1016 02/16/20 1131 02/16/20 1648 02/17/20 0507  NA 132*   < > 135  --  135 137 138 132* 131*  --  133*  K 5.0   < > 5.8*   < > 5.0 4.7 4.9 >7.5* 6.7* 4.1 4.8  CL 103   < > 103  --  106 107 110 103 104  --  99  CO2 18*   < > 19*  --  15* 16* 15* 14* 13*  --  20*  GLUCOSE 125*   < > 104*  --  102* 108* 118* 212* 225*  --  231*  BUN 68*   < > 79*  --  80* 80* 78* 90* 93*  --  55*  CREATININE 5.77*   < > 6.09*  --  5.71* 5.74* 6.04* 6.28* 6.35*  --  4.06*  CALCIUM 8.2*   < > 8.4*  --  8.1* 8.0* 7.9* 9.1 8.8*  --  8.9  PHOS 6.5*  --   6.8*  --  6.5* 6.5* 6.9* 7.1*  --   --  6.1*   < > = values in this interval not displayed.   CBC Recent Labs  Lab 02/12/20 0537 02/17/20 0507  WBC 5.6 14.4*  NEUTROABS  --  13.1*  HGB 8.1* 9.0*  HCT 25.7* 29.1*  MCV 99.6 102.1*  PLT 191 226    Medications:    . amLODipine  10 mg Oral Daily  . Chlorhexidine Gluconate Cloth  6 each Topical Q0600  . Chlorhexidine Gluconate Cloth  6 each Topical Q0600  . Chlorhexidine Gluconate Cloth  6 each Topical Q0600  . darbepoetin (ARANESP) injection - NON-DIALYSIS  100 mcg Subcutaneous Q Thu-1800  . dextrose  1 ampule Intravenous Once  . enoxaparin (LOVENOX) injection  30 mg Subcutaneous  Q24H  . feeding supplement (NEPRO CARB STEADY)  237 mL Oral TID BM  . furosemide  40 mg Oral BID  . insulin aspart  0-5 Units Subcutaneous QHS  . insulin aspart  0-6 Units Subcutaneous TID WC  . magnesium oxide  400 mg Oral Daily  . mouth rinse  15 mL Mouth Rinse BID  . methylPREDNISolone (SOLU-MEDROL) injection  60 mg Intravenous Q12H  . metoprolol tartrate  25 mg Oral BID  . sodium bicarbonate  650 mg Oral BID     [place] Constable, DO [facility] Resident, PGY 2 02/17/2020, 8:54 AM   Patient seen and examined, agree with above note with above modifications. Cindy Burns looks better after getting a HD treatment yesterday. She clearly failed the watch and wait approach so going to send her out on scheduled HD as an OP in an AKI situation.  Then can be watched closely for signs of recovery.  Is on lasix and bicarb, unclear if she will need those at discharge but cont for now. Next HD planned for Fri vs Sat based on dialysis schedule  Corliss Parish, MD 02/17/2020

## 2020-02-17 NOTE — Progress Notes (Signed)
Occupational Therapy Treatment Patient Details Name: Cindy Burns MRN: [id number] DOB: 1946/09/14 Today's Date: 02/17/2020    History of present illness Pt is a 73 year old woman admitted on 01/08/20 to [facility] and subsequently transferred to [facility] with respiratory failure requring intubation, DKA, UTI, hyperkalemia and renal failure requiring CRRT. Extubated 01/11/20. PMH: COPD, breast cancer, Dm, MDD.   OT comments  Goals updated today. Pt making excellent progress, and very motivated. Overall supervision but requires cues for safety with RW. Mod I for peri care and supervision for sink level grooming. Pt wanting to ambulate in the hallway (enjoyed listening to country music while walking) and SpO2 >90 throughout session HR in the 80's. OT will continue to follow acutely. Next session to focus on Pt's ability to don shoes and AFO.    Follow Up Recommendations  Home health OT;Supervision - Intermittent    Equipment Recommendations  Tub/shower bench    Recommendations for Other Services      Precautions / Restrictions Precautions Precautions: Fall Precaution Comments: Fall risk greatly reduced with use of Rollator Required Braces or Orthoses: Other Brace Other Brace: AFO Restrictions Weight Bearing Restrictions: No       Mobility Bed Mobility Overal bed mobility: Modified Independent                Transfers Overall transfer level: Needs assistance Equipment used: 4-wheeled walker Transfers: Sit to/from Stand Sit to Stand: Supervision         General transfer comment: Supervision for safety.     Balance Overall balance assessment: Needs assistance Sitting-balance support: No upper extremity supported;Feet supported Sitting balance-Leahy Scale: Good     Standing balance support: No upper extremity supported Standing balance-Leahy Scale: Fair Standing balance comment: Able to maintain static standing with UE support                High Level Balance  Comments: Able to stand on one leg and adjust socks with SUE support           ADL either performed or assessed with clinical judgement   ADL Overall ADL's : Needs assistance/impaired     Grooming: Wash/dry hands;Supervision/safety;Standing Grooming Details (indicate cue type and reason): standing at sink             Lower Body Dressing: Supervision/safety;Sitting/lateral leans Lower Body Dressing Details (indicate cue type and reason): also able to stand on one foot to adjust socks with UE support Toilet Transfer: Supervision/safety;Cueing for safety;Ambulation(Rollator) Armed forces technical officer Details (indicate cue type and reason): rollator; mIN cues for safety to recall needing to lock brakes before sitting Toileting- Clothing Manipulation and Hygiene: Sit to/from stand;Modified independent Toileting - Clothing Manipulation Details (indicate cue type and reason): Modified Independent with use of grab bars      Functional mobility during ADLs: Supervision/safety;Rolling walker       Vision       Perception     Praxis      Cognition Arousal/Alertness: Awake/alert Behavior During Therapy: WFL for tasks assessed/performed Overall Cognitive Status: Within Functional Limits for tasks assessed                                 General Comments: enjoyed listening to country music during OT today        Exercises     Shoulder Instructions       General Comments      Pertinent Vitals/ Pain  Pain Assessment: No/denies pain Faces Pain Scale: No hurt Pain Intervention(s): Monitored during session  Home Living                                          Prior Functioning/Environment              Frequency  Min 2X/week        Progress Toward Goals  OT Goals(current goals can now be found in the care plan section)  Progress towards OT goals: Progressing toward goals  Acute Rehab OT Goals Patient Stated Goal: Home to her  puppies OT Goal Formulation: With patient Time For Goal Achievement: 03/02/20 Potential to Achieve Goals: Good  Plan Discharge plan remains appropriate    Co-evaluation                 AM-PAC OT "6 Clicks" Daily Activity     Outcome Measure   Help from another person eating meals?: None Help from another person taking care of personal grooming?: A Little Help from another person toileting, which includes using toliet, bedpan, or urinal?: A Little Help from another person bathing (including washing, rinsing, drying)?: A Little Help from another person to put on and taking off regular upper body clothing?: A Little Help from another person to put on and taking off regular lower body clothing?: A Little 6 Click Score: 19    End of Session Equipment Utilized During Treatment: Gait belt;Other (comment)(Rollator)  OT Visit Diagnosis: Muscle weakness (generalized) (M62.81);Other symptoms and signs involving cognitive function;Unsteadiness on feet (R26.81)   Activity Tolerance Patient tolerated treatment well   Patient Left in chair;with call bell/phone within reach;with chair alarm set   Nurse Communication Mobility status        Time: 5027-7412 OT Time Calculation (min): 28 min  Charges: OT General Charges $OT Visit: 1 Visit OT Treatments $Self Care/Home Management : 8-22 mins $Therapeutic Activity: 8-22 mins  Jesse Sans OTR/L Acute Rehabilitation Services Pager: [phone] Office: [place] 02/17/2020, 12:29 PM

## 2020-02-17 NOTE — Progress Notes (Addendum)
Patient has been accepted for OP HD treatment for AKI at [facility] on TTS schedule with a seat time of 6:00am. She needs to arrive at 5:40am and will be required to sign intake paperwork the day prior to her first treatment.  Navigator notified Nephrologist/Dr. [name], CM/W. Estelle Grumbles and patient. CM assisting in setting up Medicaid Transportation. Patient is nervous about how early she will need to get picked up from her home in [place] and, though agreeable to this seat time, requests to see if there is anything later. Navigator has inquired and is awaiting an answer.  Per CM, transportation will start on Tuesday, 02/22/20 if patient is discharged by then. Navigator informed Dr. [name], who will order HD treatment for tomorrow and states as patient has dx of AKI, she can discharge any time after HD tx tomorrow and have her next tx Tuesday. Navigator informed Attending of this plan and will continue to follow for discharge readiness and assist in planning.  Update: patient now has a 1:10pm seat TTS at [facility], instead of 6:00am and no longer has to report to the clinic the day before. She will need to report at 12:00pm on her first day to sign papers and 12:50pm every treatment day following. Patient updated. She is thankful and relieved. Navigator will contact Medicaid transportation to change time for Tuesday.  Alphonzo Cruise, [place] Renal Navigator [phone]

## 2020-02-17 NOTE — Progress Notes (Signed)
Progress Note    Cindy Burns  ZOX:[id number] DOB: [birth year]  DOA: 01/08/2020 PCP: [name], MD    Brief Narrative:    Medical records reviewed and are as summarized below:  Cindy Burns is an 74 y.o. female with a past medical history significant for breast cancer, tobacco use, diabetes, COPD not on home oxygen, MDD admitted to the hospital April 3 with acute renal failure requiring CRRT, hyperkalemia, anion gap metabolic acidosis secondary to DKA, acute respiratory failure requiring intubation from April 3 April 6.  She has had persistent ongoing kidney failure.  She has had an extended stay kidney function being monitored by nephrology.  She had a renal biopsy that showed ATN.  May 12 she required urgent hemodialysis for volume overload and hyperkalemia.  Assessment/Plan:   Principal Problem:   Renal failure Active Problems:   Hypocalcemia   Metabolic acidosis   Acute respiratory failure with hypoxia (HCC)   C[employer]ARET[employer] SMO[employer]ER   Hypertension   UTI (urinary tract infection)   Pressure injury of skin   Encounter for central line placement   Pain and swelling of ankle, left  Acute hypoxic respiratory failure likely secondary to volume overload in addition to possible COPD exacerbation.  Resolved this morning.  Oxygen saturation level greater than 90% on room air.  Suspect some residual COPD exacerbation. -Continue nebulizers -Continue steroids -Flutter valve -Lasix per nephrology -Monitor oxygen saturation level  #2.  Acute kidney injury secondary to severe volume depletion in the setting of DKA/UTI/metabolic acidosis.  Dialysis was started April 27.  Her last dialysis session was April 30 but then she required urgent dialysis May 12 due to volume overload.  Status post renal biopsy April 30 revealing ATN. -Management per nephrology -Likely will need dialysis after discharge -Chart review indicates outpatient dialysis scheduling being worked on  #3.  Hyperkalemia.   Morning.  Had dialysis yesterday. -Monitor daily  #4.  UTI.  Pansensitive E. coli noted on her culture.  Blood cultures with no growth to date -Has completed a 7-day course of Rocephin  #5.  Normocytic anemia.  Stable -Monitor  #6.  Diabetes type 2.  Uncontrolled.  Has a history of noncompliance.  Hemoglobin A1c 8.5. -Sliding scale insulin -Monitor closely as expected increased due to steroids  #7.  Left foot drop/pain and swelling of the left ankle.  X-ray of the left ankle negative.  She had an MRI of the lumbar spine revealing L3-4 bulging and protrusion at L5-S1.  Evaluated by neurosurgery who recommended outpatient follow-up and possible EMG. AFO/dorsiflexion brace provided  #8. Pressure injury coccyx stage II    Family Communication/Anticipated D/C date and plan/Code Status   DVT prophylaxis: heparin ordered. Code Status: Full Code.  Family Communication: pateitn Disposition Plan: Status is: Inpatient  Remains inpatient appropriate because:Inpatient level of care appropriate due to severity of illness   Dispo: The patient is from: Home              Anticipated d/c is to: Home              Anticipated d/c date is: 1 day              Patient currently is not medically stable to d/c.          Medical Consultants:    nephrology   Anti-Infectives:    Rocephin 7 days  Subjective:   Sitting on side of bed finishing breakfast. Denies pain/discomfort  Objective:  Vitals:   02/17/20 0458 02/17/20 0500 02/17/20 0755 02/17/20 0939  BP: (!) 141/63  (!) 156/89   Pulse: 83  81 79  Resp: 18   18  Temp: 97.7 F (36.5 C)   97.8 F (36.6 C)  TempSrc: Oral   Oral  SpO2: 99%   98%  Weight:  62.2 kg    Height:        Intake/Output Summary (Last 24 hours) at 02/17/2020 1139 Last data filed at 02/17/2020 0800 Gross per 24 hour  Intake 120 ml  Output 2200 ml  Net -2080 ml   Filed Weights   02/16/20 1549 02/16/20 2027 02/17/20 0500  Weight: 62.1 kg 62.2  kg 62.2 kg    Exam: General: Sitting on the side of bed no acute distress CV: Regular rate and rhythm no murmur gallop or rub no lower extremity edema Respiratory: Mild increased work of breathing with conversation.  Breath sounds are distant with fair air movement mild diffuse wheeze Abdomen: Nondistended soft positive bowel sounds throughout no guarding or rebounding Musculoskeletal: Joints without swelling/erythema full range of motion Neuro: Alert and oriented x3 speech clear facial symmetry  Data Reviewed:   I have personally reviewed following labs and imaging studies:  Labs: Labs show the following:   Basic Metabolic Panel: Recent Labs  Lab 02/13/20 0405 02/13/20 0405 02/14/20 0327 02/14/20 0327 02/15/20 0333 02/15/20 0333 02/16/20 1016 02/16/20 1016 02/16/20 1131 02/16/20 1131 02/16/20 1648 02/17/20 0507  NA 135   < > 137  --  138  --  132*  --  131*  --   --  133*  K 5.0   < > 4.7   < > 4.9   < > >7.5*   < > 6.7*   < > 4.1 4.8  CL 106   < > 107  --  110  --  103  --  104  --   --  99  CO2 15*   < > 16*  --  15*  --  14*  --  13*  --   --  20*  GLUCOSE 102*   < > 108*  --  118*  --  212*  --  225*  --   --  231*  BUN 80*   < > 80*  --  78*  --  90*  --  93*  --   --  55*  CREATININE 5.71*   < > 5.74*  --  6.04*  --  6.28*  --  6.35*  --   --  4.06*  CALCIUM 8.1*   < > 8.0*  --  7.9*  --  9.1  --  8.8*  --   --  8.9  MG 2.1  --  2.1  --  1.9  --  1.9  --   --   --   --  2.0  PHOS 6.5*  --  6.5*  --  6.9*  --  7.1*  --   --   --   --  6.1*   < > = values in this interval not displayed.   GFR Estimated Creatinine Clearance: 11 mL/min (A) (by C-G formula based on SCr of 4.06 mg/dL (H)). Liver Function Tests: Recent Labs  Lab 02/13/20 0405 02/14/20 0327 02/15/20 0333 02/16/20 1016 02/17/20 0507  ALBUMIN 2.6* 2.5* 2.6* 3.1* 3.2*   No results for input(s): LIPASE, AMYLASE in the last 168 hours. No results for input(s): AMMONIA in the last  168  hours. Coagulation profile No results for input(s): INR, PROTIME in the last 168 hours.  CBC: Recent Labs  Lab 02/12/20 0537 02/17/20 0507  WBC 5.6 14.4*  NEUTROABS  --  13.1*  HGB 8.1* 9.0*  HCT 25.7* 29.1*  MCV 99.6 102.1*  PLT 191 226   Cardiac Enzymes: No results for input(s): CKTOTAL, CKMB, CKMBINDEX, TROPONINI in the last 168 hours. BNP (last 3 results) No results for input(s): PROBNP in the last 8760 hours. CBG: Recent Labs  Lab 02/16/20 1306 02/16/20 1621 02/16/20 2105 02/17/20 0641 02/17/20 1112  GLUCAP 204* 166* 201* 198* 222*   D-Dimer: No results for input(s): DDIMER in the last 72 hours. Hgb A1c: No results for input(s): HGBA1C in the last 72 hours. Lipid Profile: No results for input(s): CHOL, HDL, LDLCALC, TRIG, CHOLHDL, LDLDIRECT in the last 72 hours. Thyroid function studies: No results for input(s): TSH, T4TOTAL, T3FREE, THYROIDAB in the last 72 hours.  Invalid input(s): FREET3 Anemia work up: No results for input(s): VITAMINB12, FOLATE, FERRITIN, TIBC, IRON, RETICCTPCT in the last 72 hours. Sepsis Labs: Recent Labs  Lab 02/12/20 0537 02/17/20 0507  WBC 5.6 14.4*    Microbiology No results found for this or any previous visit (from the past 240 hour(s)).  Procedures and diagnostic studies:  DG Chest Port 1 View  Result Date: 02/16/2020 CLINICAL DATA:  Shortness of breath EXAM: PORTABLE CHEST 1 VIEW COMPARISON:  02/15/2020 FINDINGS: Right IJ hemodialysis catheter, stable in positioning terminating at the level of the right atrium. Stable mild cardiomegaly. Atherosclerotic calcification of the aortic knob. Small bilateral pleural effusions, left greater than right. Persistent bibasilar interstitial opacities, similar to prior. No pneumothorax. IMPRESSION: No significant interval change. Small bilateral pleural effusions, left greater than right. Persistent bibasilar interstitial opacities. Electronically Signed   By: Davina Poke D.O.   On:  02/16/2020 11:59    Medications:   . amLODipine  10 mg Oral Daily  . budesonide (PULMICORT) nebulizer solution  0.25 mg Nebulization BID  . Chlorhexidine Gluconate Cloth  6 each Topical Q0600  . Chlorhexidine Gluconate Cloth  6 each Topical Q0600  . Chlorhexidine Gluconate Cloth  6 each Topical Q0600  . darbepoetin (ARANESP) injection - NON-DIALYSIS  100 mcg Subcutaneous Q Thu-1800  . enoxaparin (LOVENOX) injection  30 mg Subcutaneous Q24H  . feeding supplement (NEPRO CARB STEADY)  237 mL Oral TID BM  . furosemide  40 mg Oral BID  . insulin aspart  0-5 Units Subcutaneous QHS  . insulin aspart  0-6 Units Subcutaneous TID WC  . ipratropium-albuterol  3 mL Nebulization Q6H  . magnesium oxide  400 mg Oral Daily  . mouth rinse  15 mL Mouth Rinse BID  . methylPREDNISolone (SOLU-MEDROL) injection  60 mg Intravenous Q12H  . metoprolol tartrate  25 mg Oral BID  . sodium bicarbonate  650 mg Oral BID   Continuous Infusions: . sodium chloride Stopped (01/13/20 0735)     LOS: 40 days   Radene Gunning NP  [facility]   How to contact the [facility] Attending or Consulting provider [place] or covering provider during after hours [place], for this patient?  1. Check the care team in [facility] and look for a) attending/consulting TRH provider listed and b) the [facility] team listed 2. Log into [email or web address] and use [place]'s universal password to access. If you do not have the password, please contact the hospital operator. 3. Locate the [facility] provider you are looking for under [facility]  and page to a number that you can be directly reached. 4. If you still have difficulty reaching the provider, please page the [facility] (Director on Call) for the Hospitalists listed on amion for assistance.  02/17/2020, 11:39 AM

## 2020-02-17 NOTE — Progress Notes (Signed)
Inpatient Diabetes Program Recommendations  AACE/ADA: New Consensus Statement on Inpatient Glycemic Control (2015)  Target Ranges:  Prepandial:   less than 140 mg/dL      Peak postprandial:   less than 180 mg/dL (1-2 hours)      Critically ill patients:  140 - 180 mg/dL   Lab Results  Component Value Date   GLUCAP 222 (H) 02/17/2020   HGBA1C 8.5 (H) 01/08/2020    Review of Glycemic Control Results for EVANEE, LUBRANO (MRN [id number]) as of 02/17/2020 12:00  Ref. Range 02/16/2020 21:05 02/17/2020 06:41 02/17/2020 11:12  Glucose-Capillary Latest Ref Range: 70 - 99 mg/dL 201 (H) 198 (H) 222 (H)    Inpatient Diabetes Program Recommendations:     If steroids continue, Novolog 3 units meal coverage TID with meals if eats at least 50 % and CBG >80 mg/dl  Thank you, Reche Dixon, RN, BSN Diabetes Coordinator Inpatient Diabetes Program [phone] (team pager from 8a-5p)

## 2020-02-18 ENCOUNTER — Encounter (HOSPITAL_COMMUNITY): Payer: Medicare Other

## 2020-02-18 LAB — CBC WITH DIFFERENTIAL/PLATELET
Abs Immature Granulocytes: 0.19 10*3/uL — ABNORMAL HIGH (ref 0.00–0.07)
Basophils Absolute: 0 10*3/uL (ref 0.0–0.1)
Basophils Relative: 0 %
Eosinophils Absolute: 0 10*3/uL (ref 0.0–0.5)
Eosinophils Relative: 0 %
HCT: 26.4 % — ABNORMAL LOW (ref 36.0–46.0)
Hemoglobin: 8.2 g/dL — ABNORMAL LOW (ref 12.0–15.0)
Immature Granulocytes: 2 %
Lymphocytes Relative: 7 %
Lymphs Abs: 0.9 10*3/uL (ref 0.7–4.0)
MCH: 31.3 pg (ref 26.0–34.0)
MCHC: 31.1 g/dL (ref 30.0–36.0)
MCV: 100.8 fL — ABNORMAL HIGH (ref 80.0–100.0)
Monocytes Absolute: 0.2 10*3/uL (ref 0.1–1.0)
Monocytes Relative: 2 %
Neutro Abs: 10.3 10*3/uL — ABNORMAL HIGH (ref 1.7–7.7)
Neutrophils Relative %: 89 %
Platelets: 215 10*3/uL (ref 150–400)
RBC: 2.62 MIL/uL — ABNORMAL LOW (ref 3.87–5.11)
RDW: 18.9 % — ABNORMAL HIGH (ref 11.5–15.5)
WBC: 11.6 10*3/uL — ABNORMAL HIGH (ref 4.0–10.5)
nRBC: 0.4 % — ABNORMAL HIGH (ref 0.0–0.2)

## 2020-02-18 LAB — RENAL FUNCTION PANEL
Albumin: 3 g/dL — ABNORMAL LOW (ref 3.5–5.0)
Anion gap: 14 (ref 5–15)
BUN: 76 mg/dL — ABNORMAL HIGH (ref 8–23)
CO2: 19 mmol/L — ABNORMAL LOW (ref 22–32)
Calcium: 8.4 mg/dL — ABNORMAL LOW (ref 8.9–10.3)
Chloride: 97 mmol/L — ABNORMAL LOW (ref 98–111)
Creatinine, Ser: 4.83 mg/dL — ABNORMAL HIGH (ref 0.44–1.00)
GFR calc Af Amer: 10 mL/min — ABNORMAL LOW (ref >60)
GFR calc non Af Amer: 8 mL/min — ABNORMAL LOW (ref >60)
Glucose, Bld: 268 mg/dL — ABNORMAL HIGH (ref 70–99)
Phosphorus: 6.1 mg/dL — ABNORMAL HIGH (ref 2.5–4.6)
Potassium: 5.1 mmol/L (ref 3.5–5.1)
Sodium: 130 mmol/L — ABNORMAL LOW (ref 135–145)

## 2020-02-18 LAB — GLUCOSE, CAPILLARY
Glucose-Capillary: 250 mg/dL — ABNORMAL HIGH (ref 70–99)
Glucose-Capillary: 248 mg/dL — ABNORMAL HIGH (ref 70–99)
Glucose-Capillary: 128 mg/dL — ABNORMAL HIGH (ref 70–99)
Glucose-Capillary: 313 mg/dL — ABNORMAL HIGH (ref 70–99)

## 2020-02-18 LAB — MAGNESIUM: Magnesium: 1.8 mg/dL (ref 1.7–2.4)

## 2020-02-18 MED ORDER — INSULIN DETEMIR 100 UNIT/ML ~~LOC~~ SOLN
5.0000 [IU] | Freq: Two times a day (BID) | SUBCUTANEOUS | Status: DC
  Administered 2020-02-18 – 2020-02-19 (×2): 5 [IU] via SUBCUTANEOUS
  Filled 2020-02-18 (×4): qty 0.05

## 2020-02-18 MED ORDER — HEPARIN SODIUM (PORCINE) 1000 UNIT/ML IJ SOLN
INTRAMUSCULAR | Status: AC
  Filled 2020-02-18: qty 4

## 2020-02-18 MED ORDER — PREDNISONE 50 MG PO TABS
50.0000 mg | ORAL_TABLET | Freq: Every day | ORAL | Status: DC
  Administered 2020-02-19: 50 mg via ORAL
  Filled 2020-02-18: qty 1

## 2020-02-18 NOTE — TOC Progression Note (Signed)
Transition of Care [facility]) - Progression Note    Patient Details  Name: Cindy Burns MRN: [id number] Date of Birth: 1946/03/15  Transition of Care [facility]) CM/SW Contact  Cindy Salina Mila Homer, Cindy Burns Phone Number: 02/18/2020, 2:29 PM  Clinical Narrative:  Received call from patient's friend Cindy Burns [phone]) requesting clarity on what agency will be providing [facility] services to patient. CSW confirmed that Cindy Burns will be providing the [facility] services and not sure what [place] agency will be providing [facility]), however Ms. Cindy Burns advised that the she or the patient will receive a call from the [place] that will be providing the [facility] services. Ms. Cindy Burns thanked CSW for this information.  CSW will continue to follow and provide SW intervention services through discharge.    Expected Discharge Plan: [place] Barriers to Discharge: Continued Medical Work up  Expected Discharge Plan and Services Expected Discharge Plan: [place] In-house Referral: Clinical Social Work, [facility] Discharge Planning Services: CM Consult Post Acute Care Choice: Durable Medical Equipment, Home Health Living arrangements for the past 2 months: Single Family Home                 DME Arranged: 3-N-1, Walker rolling with seat DME Agency: [employer] Date DME Agency Contacted: 02/17/20 Time DME Agency Contacted: 26 Representative spoke with at DME Agency: Cindy Burns HH Arranged: PT, OT [place] Agency: [place] Date [place]: 02/17/20 Time [place]: Cindy Burns Representative spoke with at [place]: Cindy Burns   Social Determinants of Health ([place]) Interventions    Readmission Risk Interventions No flowsheet data found.

## 2020-02-18 NOTE — Progress Notes (Signed)
Physical Therapy Treatment Patient Details Name: Cindy Burns MRN: [id number] DOB: 24-Oct-1945 Today's Date: 02/18/2020    History of Present Illness Pt is a 74 year old woman admitted on 01/08/20 to [facility] and subsequently transferred to [facility] with respiratory failure requring intubation, DKA, UTI, hyperkalemia and renal failure requiring CRRT. Extubated 01/11/20. PMH: COPD, breast cancer, Dm, MDD.    PT Comments    Pt in bed upon arrival of PT, eager to participate in PT session this morning with focus on progression of ambulation endurance. The pt was able to demo good progression of ambulation and good stability with rollator use today, but was challenged by addition of fast-paced intervals and speed changes during ambulation. The pt will continue to benefit from skilled PT to further progress functional endurance, strength, and stability to improve safety and independence with mobility after d/c.     Follow Up Recommendations  Home health PT(vs OPPT if reliable transportation)     Equipment Recommendations  Other (comment)(rollator)    Recommendations for Other Services       Precautions / Restrictions Precautions Precautions: Fall Precaution Comments: Fall risk greatly reduced with use of Rollator Required Braces or Orthoses: Other Brace Other Brace: AFO Restrictions Weight Bearing Restrictions: No    Mobility  Bed Mobility Overal bed mobility: Modified Independent             General bed mobility comments: mod I, inc time, no physical assist needed  Transfers Overall transfer level: Needs assistance Equipment used: 4-wheeled walker Transfers: Sit to/from [facility] Sit to Stand: Supervision Stand pivot transfers: Supervision       General transfer comment: supervision for safety, pt able to complete from bed as well as navigate in bathroom, good use of breaks with rollator  Ambulation/Gait Ambulation/Gait assistance: Supervision Gait Distance  (Feet): 200 Feet Assistive device: 4-wheeled walker Gait Pattern/deviations: Step-through pattern Gait velocity: 0.57 m/s "comfortable pace" and 0.88 m/s "fast pace" Gait velocity interpretation: 1.31 - 2.62 ft/sec, indicative of limited community ambulator General Gait Details: wore AFO today, pushed endurance as well as changing speed with ambulation. The pt completed "fast paced" intervals x3 during ambulation with rollator   Stairs             Wheelchair Mobility    Modified Rankin (Stroke Patients Only)       Balance Overall balance assessment: Needs assistance Sitting-balance support: No upper extremity supported;Feet supported Sitting balance-Leahy Scale: Good Sitting balance - Comments: no LOB noted when reaching to don socks and shoes       Standing balance comment: Able to maintain static standing without UE support                            Cognition Arousal/Alertness: Awake/alert Behavior During Therapy: WFL for tasks assessed/performed Overall Cognitive Status: Within Functional Limits for tasks assessed                                        Exercises      General Comments General comments (skin integrity, edema, etc.): Turned L sock inside out to fit inside AFO better, pt reporting good fit. no LOB, VSS SpO2 in 90s through session.      Pertinent Vitals/Pain Pain Assessment: No/denies pain Pain Intervention(s): Monitored during session;Repositioned    Home Living  Prior Function            PT Goals (current goals can now be found in the care plan section) Acute Rehab PT Goals Patient Stated Goal: Home to her puppies PT Goal Formulation: With patient Time For Goal Achievement: 02/24/20 Potential to Achieve Goals: Good Progress towards PT goals: Progressing toward goals    Frequency    Min 3X/week      PT Plan Current plan remains appropriate    Co-evaluation               AM-PAC PT "6 Clicks" Mobility   Outcome Measure  Help needed turning from your back to your side while in a flat bed without using bedrails?: None Help needed moving from lying on your back to sitting on the side of a flat bed without using bedrails?: None Help needed moving to and from a bed to a chair (including a wheelchair)?: None Help needed standing up from a chair using your arms (e.g., wheelchair or bedside chair)?: None Help needed to walk in hospital room?: A Little Help needed climbing 3-5 steps with a railing? : A Little 6 Click Score: 22    End of Session Equipment Utilized During Treatment: Gait belt Activity Tolerance: Patient tolerated treatment well Patient left: with call bell/phone within reach;in chair Nurse Communication: Mobility status PT Visit Diagnosis: Unsteadiness on feet (R26.81);Other abnormalities of gait and mobility (R26.89);Muscle weakness (generalized) (M62.81)     Time: 0786-7544 PT Time Calculation (min) (ACUTE ONLY): 23 min  Charges:  $Gait Training: 23-37 mins                     Karma Ganja, PT, DPT   Acute Rehabilitation Department Pager #: [phone]   Otho Bellows 02/18/2020, 1:13 PM

## 2020-02-18 NOTE — Consult Note (Signed)
   [facility] CM Inpatient Consult   02/18/2020  MIRTHA JAIN 1945/10/25 [id number]   Referral received for potential [facility] Care Management services from inpatient RNCM. Patient eligible for services under ACO benefit with [facility] insurance plan.   Hospital liaison will continue to follow for progression and disposition. Of note, [facility] Care Management services does not replace or interfere with any services that are arranged by inpatient case management or social work.  Netta Cedars, MSN, [place] [facility] Liaison Nurse Mobile Phone [phone]  Toll free office [phone]

## 2020-02-18 NOTE — Plan of Care (Signed)
  Problem: Education: Goal: Knowledge of General Education information will improve Description: Including pain rating scale, medication(s)/side effects and non-pharmacologic comfort measures Outcome: Completed/Met   Problem: Health Behavior/Discharge Planning: Goal: Ability to manage health-related needs will improve Outcome: Completed/Met   Problem: Clinical Measurements: Goal: Ability to maintain clinical measurements within normal limits will improve Outcome: Completed/Met Goal: Will remain free from infection Outcome: Completed/Met Goal: Diagnostic test results will improve Outcome: Completed/Met Goal: Respiratory complications will improve Outcome: Completed/Met Goal: Cardiovascular complication will be avoided Outcome: Completed/Met   Problem: Coping: Goal: Level of anxiety will decrease Outcome: Completed/Met   Problem: Elimination: Goal: Will not experience complications related to bowel motility Outcome: Completed/Met   Problem: Skin Integrity: Goal: Risk for impaired skin integrity will decrease Outcome: Completed/Met   Problem: Fluid Volume: Goal: Hemodynamic stability will improve Outcome: Completed/Met   Problem: Clinical Measurements: Goal: Diagnostic test results will improve Outcome: Completed/Met Goal: Signs and symptoms of infection will decrease Outcome: Completed/Met   Problem: Respiratory: Goal: Ability to maintain adequate ventilation will improve Outcome: Completed/Met

## 2020-02-18 NOTE — Progress Notes (Signed)
Renal Navigator contacted Medicaid Transportation to change patient's transportation for Tuesday 02/22/20, as her OP HD appointment has changed to later in the day per her request. Transportation will contact patient on Monday to inform her of her pick up time. Transportation will base her pick up time on her need to arrive at the clinic at 12:00pm Tuesday.  Alphonzo Cruise, [place] Renal Navigator [phone]

## 2020-02-18 NOTE — Progress Notes (Signed)
PROGRESS NOTE  Cindy Burns PXT:[id number] DOB: Feb 29, 1946 DOA: 01/08/2020 PCP: [name], MD  HPI/Recap of past 24 hours: HPI from Dr Magnus Sinning is an 74 y.o. female with a past medical history significant for breast cancer, tobacco use, diabetes, COPD not on home oxygen, MDD admitted to the hospital April 3 with acute renal failure requiring CRRT, hyperkalemia, anion gap metabolic acidosis secondary to DKA, acute respiratory failure requiring intubation from April 3 April 6.  She has had persistent ongoing kidney failure.  She is being followed by nephrology.  Dialysis was started on April 27.  She underwent a renal biopsy April 30 revealing ATN.  Currently in diuresis phase of recovery.  Evaluated daily by nephrology.     Today, patient still short of breath, with very slight improvement from yesterday.  Reports left arm edema with some pain.  Patient denies any chest pain, abdominal pain, nausea/vomiting, fever/chills.   Assessment/Plan: Principal Problem:   Renal failure Active Problems:   CIGARETTE SMOKER   Hypertension   Pressure injury of skin   Encounter for central line placement   Pain and swelling of ankle, left   Hypocalcemia   Metabolic acidosis   Acute respiratory failure with hypoxia (HCC)   UTI (urinary tract infection)   Acute hypoxic respiratory failure likely 2/2 vol overload in addition to possible COPD exacerbation Likely 2/2 volume overload, in addition to COPD exacerbation Chest x-ray with volume overload Scheduled duo nebs, steroids Nephrology on board, status post urgent dialysis on 02/16/2020 Continue Lasix Supplemental oxygen as needed, flutter valve, incentive spirometry  Acute kidney injury 2/2 severe volume depletion in the setting of DKA, UTI/metabolic acidosis Dialysis started April 27, required urgent dialysis on 02/16/2020 due to volume overload Status post renal biopsy by interventional radiology April 30 Preliminary path  report per nephrology shows ATN Management per nephrology  UTI Pansensitive E. coli noted on her culture Blood cultures with no growth to date S/p 7-day of Rocephin  Normocytic anemia Stable Daily CBC  Diabetes mellitus type 2  Hemoglobin A1c was 8.5, uncontrolled SSI, Accu-Cheks, hypoglycemic protocol  Left foot drop/pain and swelling of the left ankle X-ray of the left ankle negative. She did get MRI of the lumbar spine revealing L3-4 bulging and protrusion at L5 1. Evaluated by neurosurgery who recommended outpatient follow-up and possible EMG. AFO/dorsiflexion brace provided  Left arm edema Left arm venous Doppler pending  Pressure injury coccyx stage II       Malnutrition Type:  Nutrition Problem: Inadequate oral intake Etiology: inability to eat   Malnutrition Characteristics:  Signs/Symptoms: NPO status   Nutrition Interventions:  Interventions: Tube feeding, MVI    Estimated body mass index is 24.68 kg/m as calculated from the following:   Height as of this encounter: 5\' 2"  (1.575 m).   Weight as of this encounter: 61.2 kg.     Code Status: Full  Family Communication: Discussed with patient  Disposition Plan: Status is: Inpatient  Remains inpatient appropriate because:Inpatient level of care appropriate due to severity of illness   Dispo: The patient is from: Home              Anticipated d/c is to: Home              Anticipated d/c date is: 1 day              Patient currently is not medically stable to d/c.    Consultants:  Nephrology  Procedures:  None  Antimicrobials:  None  DVT prophylaxis: Lovenox   Objective: Vitals:   02/18/20 1400 02/18/20 1430 02/18/20 1500 02/18/20 1530  BP: (!) 169/71 (!) 167/73 (!) 175/74 (!) 178/72  Pulse: 79 81 80 78  Resp: 16 16 16 16   Temp:      TempSrc:      SpO2:      Weight:      Height:        Intake/Output Summary (Last 24 hours) at 02/18/2020 1543 Last data filed at  02/18/2020 1100 Gross per 24 hour  Intake 1680 ml  Output 1575 ml  Net 105 ml   Filed Weights   02/17/20 0500 02/17/20 2107 02/18/20 1300  Weight: 62.2 kg 61.2 kg 61.2 kg    Exam:  General:  NAD  Cardiovascular: S1, S2 present  Respiratory: Diminished BS b/l  Abdomen: Soft, nontender, nondistended, bowel sounds present  Musculoskeletal: No bilateral pedal edema noted  Skin: Normal  Psychiatry: Normal mood   Data Reviewed: CBC: Recent Labs  Lab 02/12/20 0537 02/17/20 0507 02/18/20 0542  WBC 5.6 14.4* 11.6*  NEUTROABS  --  13.1* 10.3*  HGB 8.1* 9.0* 8.2*  HCT 25.7* 29.1* 26.4*  MCV 99.6 102.1* 100.8*  PLT 191 226 185   Basic Metabolic Panel: Recent Labs  Lab 02/14/20 0327 02/14/20 0327 02/15/20 0333 02/15/20 0333 02/16/20 1016 02/16/20 1131 02/16/20 1648 02/17/20 0507 02/18/20 0542  NA 137   < > 138  --  132* 131*  --  133* 130*  K 4.7   < > 4.9   < > >7.5* 6.7* 4.1 4.8 5.1  CL 107   < > 110  --  103 104  --  99 97*  CO2 16*   < > 15*  --  14* 13*  --  20* 19*  GLUCOSE 108*   < > 118*  --  212* 225*  --  231* 268*  BUN 80*   < > 78*  --  90* 93*  --  55* 76*  CREATININE 5.74*   < > 6.04*  --  6.28* 6.35*  --  4.06* 4.83*  CALCIUM 8.0*   < > 7.9*  --  9.1 8.8*  --  8.9 8.4*  MG 2.1  --  1.9  --  1.9  --   --  2.0 1.8  PHOS 6.5*  --  6.9*  --  7.1*  --   --  6.1* 6.1*   < > = values in this interval not displayed.   GFR: Estimated Creatinine Clearance: 9.2 mL/min (A) (by C-G formula based on SCr of 4.83 mg/dL (H)). Liver Function Tests: Recent Labs  Lab 02/14/20 0327 02/15/20 0333 02/16/20 1016 02/17/20 0507 02/18/20 0542  ALBUMIN 2.5* 2.6* 3.1* 3.2* 3.0*   No results for input(s): LIPASE, AMYLASE in the last 168 hours. No results for input(s): AMMONIA in the last 168 hours. Coagulation Profile: No results for input(s): INR, PROTIME in the last 168 hours. Cardiac Enzymes: No results for input(s): CKTOTAL, CKMB, CKMBINDEX, TROPONINI in the  last 168 hours. BNP (last 3 results) No results for input(s): PROBNP in the last 8760 hours. HbA1C: No results for input(s): HGBA1C in the last 72 hours. CBG: Recent Labs  Lab 02/17/20 1112 02/17/20 1629 02/17/20 2108 02/18/20 0633 02/18/20 1117  GLUCAP 222* 206* 315* 250* 248*   Lipid Profile: No results for input(s): CHOL, HDL, LDLCALC, TRIG, CHOLHDL, LDLDIRECT in the last 72 hours. Thyroid Function  Tests: No results for input(s): TSH, T4TOTAL, FREET4, T3FREE, THYROIDAB in the last 72 hours. Anemia Panel: No results for input(s): VITAMINB12, FOLATE, FERRITIN, TIBC, IRON, RETICCTPCT in the last 72 hours. Urine analysis:    Component Value Date/Time   COLORURINE YELLOW 02/13/2020 0037   APPEARANCEUR HAZY (A) 02/13/2020 0037   LABSPEC 1.004 (L) 02/13/2020 0037   PHURINE 7.0 02/13/2020 0037   GLUCOSEU NEGATIVE 02/13/2020 0037   HGBUR NEGATIVE 02/13/2020 0037   HGBUR trace-intact 06/24/2008 0912   BILIRUBINUR NEGATIVE 02/13/2020 0037   BILIRUBINUR negative 09/23/2013 1559   KETONESUR NEGATIVE 02/13/2020 0037   PROTEINUR NEGATIVE 02/13/2020 0037   UROBILINOGEN 0.2 09/23/2013 1559   UROBILINOGEN 0.2 06/24/2008 0912   NITRITE NEGATIVE 02/13/2020 0037   LEUKOCYTESUR LARGE (A) 02/13/2020 0037   Sepsis Labs: @LABRCNTIP (procalcitonin:4,lacticidven:4)  )No results found for this or any previous visit (from the past 240 hour(s)).    Studies: No results found.  Scheduled Meds: . amLODipine  10 mg Oral Daily  . budesonide (PULMICORT) nebulizer solution  0.25 mg Nebulization BID  . Chlorhexidine Gluconate Cloth  6 each Topical Q0600  . Chlorhexidine Gluconate Cloth  6 each Topical Q0600  . darbepoetin (ARANESP) injection - NON-DIALYSIS  100 mcg Subcutaneous Q Thu-1800  . enoxaparin (LOVENOX) injection  30 mg Subcutaneous Q24H  . feeding supplement (NEPRO CARB STEADY)  237 mL Oral TID BM  . furosemide  40 mg Oral BID  . insulin aspart  0-5 Units Subcutaneous QHS  . insulin  aspart  0-6 Units Subcutaneous TID WC  . insulin aspart  3 Units Subcutaneous TID WC  . insulin detemir  5 Units Subcutaneous BID  . ipratropium-albuterol  3 mL Nebulization TID  . magnesium oxide  400 mg Oral Daily  . mouth rinse  15 mL Mouth Rinse BID  . metoprolol tartrate  25 mg Oral BID  . [START ON 02/19/2020] predniSONE  50 mg Oral Q breakfast    Continuous Infusions: . sodium chloride Stopped (01/13/20 0735)     LOS: 41 days     Alma Friendly, MD [facility]  If 7PM-7AM, please contact night-coverage [email or web address] 02/18/2020, 3:43 PM

## 2020-02-18 NOTE — Progress Notes (Addendum)
[facility] Progress Note    Assessment/ Plan:   Cindy Burns is a 75 y.o. female with PMH breast cancer, tobacco use, DM, COPD, MDD, admitted 4/3 in setting of ARF, DKA, and acute respiratory failure requiring intubation and CRRT.  Transitioned to IHD and held after session on 4/30.  On 5/12 had worsening renal function and hyperkalemia with volume overload, therefore HD initiated.  Will require outpatient HD for AKI.  1. AKI - baseline Cr WNL at 0.92 in 07/2019.  Felt to be due to ATN from hypovolemia and hemodynamic insults due to infection but also had nephrotic range proteinuria - underwent renal biopsy on 02/04/20- dense ATN/ acute papillary necrosis. She was started on CRRT 4/3-4/6 then eventually transitioned to IHD on 01/31/20 and had session on 4/30.  Cr oscillating, but on 5/12 developed hyperkalemia with worsening Cr in setting of volume overload requiring emergent HD. Patient has seemed to declare herself to need further outpatient HD. 1. UOP good, 2.2L. but not clearing   2. Patient has TTS seat with plan for first outpatient session to be on Tuesday 5/18.  Medicaid transport has been secured by CM and Renal Navigator.  Will do 3 hour treatments for now and watch closely for any signs of recovery 3. Will have inpatient HD session today and can likely discharge afterwards or tomorrow, can wait til Tuesday for next tx. 4. Continue Lasix for now b/c UOP is good and is still a little overloaded.  3. Hyperkalemia- Now resolved after re-initiating HD.  K this AM 5.1.  Plan per above. 4. Metabolic acidosis-  Can d/c PO bicarb as patient now on HD.  CO2 19 this AM.  5. Macrocytic Anemia- Anemia panel from 4/23, iron 84, TIBC 204, sat ratios 41, ferritin 418.  Currently on ESA, last dose 5/13. 6. Acute hypoxic respiratory failure- due to COPD, intubated early in admission but has done well since. On solumedrol.  Improved after HD.   7. UTI-  s/p 7d CTX 8. DM type II- poorly  controlled. 9. Left foot drop- MRI bulging disc at L3-L4 and protrusion L5-S1. To follow up with Neurosurgery as an outpatient. 10. Left arm edema - likely also related ot fluid status given pitting edema in legs as well.  Patient has history of DVT in left arm and is concerned, so found it reasonable to get vascular US.  Primary attending notified.  Patient seen and examined, agree with above note with above modifications. AKI- now nonoliguric but not clearing and got volume overloaded-  Needed to resume HD.  Plan now is to send her out on HD-  An AKI routine-  Was doing 3 hour HD- still possibly hopeful for recovery from this dense ATN it is just taking its time.  Doing left arm duplex to make sure not recurrent DVT-  Possibly low yield.  Send out on lasix since non oliguric   Corliss Parish, MD 02/18/2020      Subjective:   No acute events overnight.  UOP 2.2L.  Reports that breathing continues to be good at rest, but is somewhat labored with exertion.  Denies orthopnea.  Endorses concern over swelling in her left arm given a history of DVT many years ago in the same arm when she had cancer.   Objective:   BP (!) 142/70 (BP Location: Right Wrist)   Pulse 79   Temp 97.7 F (36.5 C) (Oral)   Resp 16   Ht 5\' 2"  (1.575 m)  Wt 61.2 kg   SpO2 96%   BMI 24.69 kg/m   Intake/Output Summary (Last 24 hours) at 02/18/2020 0859 Last data filed at 02/18/2020 0700 Gross per 24 hour  Intake 1020 ml  Output 1700 ml  Net -680 ml   Weight change: -1.814 kg  Physical Exam: General: 74 y.o. female in NAD Cardio: RRR no m/r/g Lungs: end-expiratory wheezing throughout, good air movement, no increased WOB on RA Abdomen: Soft, non-tender to palpation Skin: warm and dry Extremities:1+ pitting edema LUE near elbow, 1+ pitting edema BLE    Imaging: DG Chest Port 1 View  Result Date: 02/16/2020 CLINICAL DATA:  Shortness of breath EXAM: PORTABLE CHEST 1 VIEW COMPARISON:  02/15/2020  FINDINGS: Right IJ hemodialysis catheter, stable in positioning terminating at the level of the right atrium. Stable mild cardiomegaly. Atherosclerotic calcification of the aortic knob. Small bilateral pleural effusions, left greater than right. Persistent bibasilar interstitial opacities, similar to prior. No pneumothorax. IMPRESSION: No significant interval change. Small bilateral pleural effusions, left greater than right. Persistent bibasilar interstitial opacities. Electronically Signed   By: Davina Poke D.O.   On: 02/16/2020 11:59    Labs: BMET Recent Labs  Lab 02/12/20 0537 02/12/20 0537 02/13/20 0405 02/13/20 0405 02/14/20 0327 02/15/20 0333 02/16/20 1016 02/16/20 1131 02/16/20 1648 02/17/20 0507 02/18/20 0542  NA 135   < > 135  --  137 138 132* 131*  --  133* 130*  K 5.8*   < > 5.0   < > 4.7 4.9 >7.5* 6.7* 4.1 4.8 5.1  CL 103   < > 106  --  107 110 103 104  --  99 97*  CO2 19*   < > 15*  --  16* 15* 14* 13*  --  20* 19*  GLUCOSE 104*   < > 102*  --  108* 118* 212* 225*  --  231* 268*  BUN 79*   < > 80*  --  80* 78* 90* 93*  --  55* 76*  CREATININE 6.09*   < > 5.71*  --  5.74* 6.04* 6.28* 6.35*  --  4.06* 4.83*  CALCIUM 8.4*   < > 8.1*  --  8.0* 7.9* 9.1 8.8*  --  8.9 8.4*  PHOS 6.8*  --  6.5*  --  6.5* 6.9* 7.1*  --   --  6.1* 6.1*   < > = values in this interval not displayed.   CBC Recent Labs  Lab 02/12/20 0537 02/17/20 0507 02/18/20 0542  WBC 5.6 14.4* 11.6*  NEUTROABS  --  13.1* 10.3*  HGB 8.1* 9.0* 8.2*  HCT 25.7* 29.1* 26.4*  MCV 99.6 102.1* 100.8*  PLT 191 226 215    Medications:    . amLODipine  10 mg Oral Daily  . budesonide (PULMICORT) nebulizer solution  0.25 mg Nebulization BID  . Chlorhexidine Gluconate Cloth  6 each Topical Q0600  . Chlorhexidine Gluconate Cloth  6 each Topical Q0600  . darbepoetin (ARANESP) injection - NON-DIALYSIS  100 mcg Subcutaneous Q Thu-1800  . enoxaparin (LOVENOX) injection  30 mg Subcutaneous Q24H  . feeding  supplement (NEPRO CARB STEADY)  237 mL Oral TID BM  . furosemide  40 mg Oral BID  . insulin aspart  0-5 Units Subcutaneous QHS  . insulin aspart  0-6 Units Subcutaneous TID WC  . insulin aspart  3 Units Subcutaneous TID WC  . ipratropium-albuterol  3 mL Nebulization TID  . magnesium oxide  400 mg Oral Daily  .  mouth rinse  15 mL Mouth Rinse BID  . methylPREDNISolone (SOLU-MEDROL) injection  60 mg Intravenous Q12H  . metoprolol tartrate  25 mg Oral BID  . sodium bicarbonate  650 mg Oral BID     [place] Constable, DO [facility] Family Medicine Resident, PGY 2 02/18/2020, 8:59 AM

## 2020-02-18 NOTE — Progress Notes (Signed)
Inpatient Diabetes Program Recommendations  AACE/ADA: New Consensus Statement on Inpatient Glycemic Control (2015)  Target Ranges:  Prepandial:   less than 140 mg/dL      Peak postprandial:   less than 180 mg/dL (1-2 hours)      Critically ill patients:  140 - 180 mg/dL   Lab Results  Component Value Date   GLUCAP 250 (H) 02/18/2020   HGBA1C 8.5 (H) 01/08/2020    Review of Glycemic Control Results for Cindy Burns, Cindy Burns (MRN [id number]) as of 02/18/2020 09:38  Ref. Range 02/17/2020 06:41 02/17/2020 11:12 02/17/2020 16:29 02/17/2020 21:08 02/18/2020 06:33  Glucose-Capillary Latest Ref Range: 70 - 99 mg/dL 198 (H) 222 (H) 206 (H) 315 (H) 250 (H)   Diabetes history: DM2 Outpatient Diabetes medications: Has not been taking  Current orders for Inpatient glycemic control: Novolog 0-6 TID + 0-5 units QHS + Novolog 3 units meal coverage TID + Solumedrol 60 QD  Inpatient Diabetes Program Recommendations:     Novolog 0-9 units TID with meals May benefit from small amount of basal insulin if steroids continue. Levemir 5 BID  Thank you, Reche Dixon, RN, BSN Diabetes Coordinator Inpatient Diabetes Program [phone] (team pager from 8a-5p)

## 2020-02-19 ENCOUNTER — Inpatient Hospital Stay (HOSPITAL_COMMUNITY): Payer: Medicare Other

## 2020-02-19 DIAGNOSIS — R609 Edema, unspecified: Secondary | ICD-10-CM

## 2020-02-19 LAB — GLUCOSE, CAPILLARY
Glucose-Capillary: 102 mg/dL — ABNORMAL HIGH (ref 70–99)
Glucose-Capillary: 150 mg/dL — ABNORMAL HIGH (ref 70–99)

## 2020-02-19 LAB — RENAL FUNCTION PANEL
Albumin: 3.2 g/dL — ABNORMAL LOW (ref 3.5–5.0)
Anion gap: 12 (ref 5–15)
BUN: 35 mg/dL — ABNORMAL HIGH (ref 8–23)
CO2: 25 mmol/L (ref 22–32)
Calcium: 8.3 mg/dL — ABNORMAL LOW (ref 8.9–10.3)
Chloride: 97 mmol/L — ABNORMAL LOW (ref 98–111)
Creatinine, Ser: 2.91 mg/dL — ABNORMAL HIGH (ref 0.44–1.00)
GFR calc Af Amer: 18 mL/min — ABNORMAL LOW (ref >60)
GFR calc non Af Amer: 16 mL/min — ABNORMAL LOW (ref >60)
Glucose, Bld: 164 mg/dL — ABNORMAL HIGH (ref 70–99)
Phosphorus: 4.1 mg/dL (ref 2.5–4.6)
Potassium: 3.8 mmol/L (ref 3.5–5.1)
Sodium: 134 mmol/L — ABNORMAL LOW (ref 135–145)

## 2020-02-19 LAB — CBC WITH DIFFERENTIAL/PLATELET
Abs Immature Granulocytes: 0.31 10*3/uL — ABNORMAL HIGH (ref 0.00–0.07)
Basophils Absolute: 0 10*3/uL (ref 0.0–0.1)
Basophils Relative: 0 %
Eosinophils Absolute: 0 10*3/uL (ref 0.0–0.5)
Eosinophils Relative: 0 %
HCT: 29.5 % — ABNORMAL LOW (ref 36.0–46.0)
Hemoglobin: 9.3 g/dL — ABNORMAL LOW (ref 12.0–15.0)
Immature Granulocytes: 3 %
Lymphocytes Relative: 12 %
Lymphs Abs: 1.3 10*3/uL (ref 0.7–4.0)
MCH: 32 pg (ref 26.0–34.0)
MCHC: 31.5 g/dL (ref 30.0–36.0)
MCV: 101.4 fL — ABNORMAL HIGH (ref 80.0–100.0)
Monocytes Absolute: 0.8 10*3/uL (ref 0.1–1.0)
Monocytes Relative: 7 %
Neutro Abs: 9.1 10*3/uL — ABNORMAL HIGH (ref 1.7–7.7)
Neutrophils Relative %: 78 %
Platelets: 217 10*3/uL (ref 150–400)
RBC: 2.91 MIL/uL — ABNORMAL LOW (ref 3.87–5.11)
RDW: 18.6 % — ABNORMAL HIGH (ref 11.5–15.5)
WBC: 11.6 10*3/uL — ABNORMAL HIGH (ref 4.0–10.5)
nRBC: 1 % — ABNORMAL HIGH (ref 0.0–0.2)

## 2020-02-19 LAB — MAGNESIUM: Magnesium: 1.8 mg/dL (ref 1.7–2.4)

## 2020-02-19 MED ORDER — AMLODIPINE BESYLATE 10 MG PO TABS: 10.0000 mg | ORAL_TABLET | Freq: Every day | ORAL | 0 refills | Status: AC

## 2020-02-19 MED ORDER — BREO ELLIPTA 100-25 MCG/INH IN AEPB: 1.0000 | INHALATION_SPRAY | Freq: Every day | RESPIRATORY_TRACT | 0 refills | Status: AC

## 2020-02-19 MED ORDER — FUROSEMIDE 40 MG PO TABS: 40.0000 mg | ORAL_TABLET | Freq: Two times a day (BID) | ORAL | 0 refills | Status: DC

## 2020-02-19 MED ORDER — PREDNISONE 10 MG PO TABS: ORAL_TABLET | ORAL | 0 refills | Status: AC

## 2020-02-19 MED ORDER — ALBUTEROL SULFATE HFA 108 (90 BASE) MCG/ACT IN AERS: 1.0000 | INHALATION_SPRAY | RESPIRATORY_TRACT | 0 refills | Status: DC | PRN

## 2020-02-19 MED ORDER — METOPROLOL TARTRATE 25 MG PO TABS: 25.0000 mg | ORAL_TABLET | Freq: Two times a day (BID) | ORAL | 0 refills | Status: DC

## 2020-02-19 MED ORDER — NEPRO/CARBSTEADY PO LIQD: 237.0000 mL | Freq: Three times a day (TID) | ORAL | 0 refills | Status: AC

## 2020-02-19 MED ORDER — IPRATROPIUM-ALBUTEROL 0.5-2.5 (3) MG/3ML IN SOLN: 3.0000 mL | Freq: Four times a day (QID) | RESPIRATORY_TRACT | 0 refills | Status: AC | PRN

## 2020-02-19 MED ORDER — NICOTINE 21 MG/24HR TD PT24
21.0000 mg | MEDICATED_PATCH | Freq: Every day | TRANSDERMAL | 0 refills | Status: AC
Start: 2020-02-19 — End: 2020-03-20

## 2020-02-19 NOTE — Care Management (Addendum)
Notified [facility] services that patient will DC today. Notified [employer] that patient will need nebulizer delivered to room prior to DC.

## 2020-02-19 NOTE — Progress Notes (Signed)
[facility] Progress Note    Assessment/ Plan:   Cindy Burns is a 74 y.o. female with PMH breast cancer, tobacco use, DM, COPD, MDD, admitted 4/3 in setting of ARF, DKA, and acute respiratory failure requiring intubation and CRRT.  Transitioned to IHD and held after session on 4/30.  On 5/12 had worsening renal function and hyperkalemia with volume overload, therefore HD initiated.  Will require outpatient HD for AKI.  1. AKI - baseline Cr WNL at 0.92 in 07/2019.  Felt to be due to ATN from hypovolemia and hemodynamic insults due to infection but also had nephrotic range proteinuria - underwent renal biopsy on 02/04/20- dense ATN/ acute papillary necrosis. She was started on CRRT 4/3-4/6 then eventually transitioned to IHD on 01/31/20 and had session on 4/30.  Cr oscillating, but on 5/12 developed hyperkalemia with worsening Cr in setting of volume overload requiring emergent HD. Patient has seemed to declare herself to need further outpatient HD. 1. Urine output remains good at 1.7 L but clearance is poor.  Tolerated 2 L ultrafiltration yesterday 2. Patient has TTS seat with plan for first outpatient session to be on Tuesday 5/18.  Medicaid transport has been secured by CM and Renal Navigator.  3. Likely discharge this weekend and start dialysis outpatient 4. Outpatient unit to monitor for recovery 5. Continue Lasix for now b/c UOP is good and is still a little overloaded.  3. Hyperkalemia- Now resolved after re-initiating HD.  No changes today 4. Metabolic acidosis-controlled on hemodialysis 5. Macrocytic Anemia- Anemia panel from 4/23, iron 84, TIBC 204, sat ratios 41, ferritin 418.  Currently on ESA, last dose 5/13. 6. Acute hypoxic respiratory failure- due to COPD, intubated early in admission but has done well since. On solumedrol.  Improved after HD.   7. UTI-  s/p 7d CTX 8. DM type II- poorly controlled.  With hyperglycemia 9. Left foot drop- MRI bulging disc at L3-L4  and protrusion L5-S1. To follow up with Neurosurgery as an outpatient. 10.       Subjective:   Patient feels well today without specific complaints.  Shortness of breath improved.  Tolerated dialysis with no issues..   Objective:   BP (!) 158/78 (BP Location: Right Arm)   Pulse 75   Temp 97.6 F (36.4 C) (Oral)   Resp 18   Ht 5\' 2"  (1.575 m)   Wt 59 kg   SpO2 100%   BMI 23.78 kg/m   Intake/Output Summary (Last 24 hours) at 02/19/2020 1134 Last data filed at 02/19/2020 0518 Gross per 24 hour  Intake 837 ml  Output 3500 ml  Net -2663 ml   Weight change: -0.036 kg  Physical Exam: General: 74 y.o. female in NAD Cardio: RRR no m/r/g Lungs: Bilateral chest rise, no increased work of breathing Abdomen: Soft, non-tender to palpation Skin: warm and dry Extremities: Trace pitting edema in all extremities    Imaging: No results found.  Labs: BMET Recent Labs  Lab 02/13/20 0405 02/13/20 0405 02/14/20 0327 02/14/20 0327 02/15/20 0333 02/16/20 1016 02/16/20 1131 02/16/20 1648 02/17/20 0507 02/18/20 0542 02/19/20 0411  NA 135   < > 137  --  138 132* 131*  --  133* 130* 134*  K 5.0   < > 4.7   < > 4.9 >7.5* 6.7* 4.1 4.8 5.1 3.8  CL 106   < > 107  --  110 103 104  --  99 97* 97*  CO2 15*   < >  16*  --  15* 14* 13*  --  20* 19* 25  GLUCOSE 102*   < > 108*  --  118* 212* 225*  --  231* 268* 164*  BUN 80*   < > 80*  --  78* 90* 93*  --  55* 76* 35*  CREATININE 5.71*   < > 5.74*  --  6.04* 6.28* 6.35*  --  4.06* 4.83* 2.91*  CALCIUM 8.1*   < > 8.0*  --  7.9* 9.1 8.8*  --  8.9 8.4* 8.3*  PHOS 6.5*  --  6.5*  --  6.9* 7.1*  --   --  6.1* 6.1* 4.1   < > = values in this interval not displayed.   CBC Recent Labs  Lab 02/17/20 0507 02/18/20 0542 02/19/20 0411  WBC 14.4* 11.6* 11.6*  NEUTROABS 13.1* 10.3* 9.1*  HGB 9.0* 8.2* 9.3*  HCT 29.1* 26.4* 29.5*  MCV 102.1* 100.8* 101.4*  PLT 226 215 217    Medications:    . amLODipine  10 mg Oral Daily  .  Chlorhexidine Gluconate Cloth  6 each Topical Q0600  . Chlorhexidine Gluconate Cloth  6 each Topical Q0600  . darbepoetin (ARANESP) injection - NON-DIALYSIS  100 mcg Subcutaneous Q Thu-1800  . enoxaparin (LOVENOX) injection  30 mg Subcutaneous Q24H  . feeding supplement (NEPRO CARB STEADY)  237 mL Oral TID BM  . furosemide  40 mg Oral BID  . insulin aspart  0-5 Units Subcutaneous QHS  . insulin aspart  0-6 Units Subcutaneous TID WC  . insulin aspart  3 Units Subcutaneous TID WC  . insulin detemir  5 Units Subcutaneous BID  . magnesium oxide  400 mg Oral Daily  . mouth rinse  15 mL Mouth Rinse BID  . metoprolol tartrate  25 mg Oral BID  . predniSONE  50 mg Oral Q breakfast      02/19/2020, 11:34 AM

## 2020-02-19 NOTE — Discharge Summary (Signed)
Discharge Summary  Cindy Burns GGY:[id number] DOB: [birth year]  PCP: Cindy Sanders, MD  Admit date: 01/08/2020 Discharge date: 02/19/2020  Time spent: 40 mins  Recommendations for Outpatient Follow-up:  1. PCP in 1 week 2. Outpatient hemodialysis with nephrology    Discharge Diagnoses:  Active Hospital Problems   Diagnosis Date Noted  . Renal failure 01/08/2020  . Hypocalcemia 01/19/2020  . Metabolic acidosis 03/50/0938  . Acute respiratory failure with hypoxia ([place]) 01/19/2020  . UTI (urinary tract infection) 01/19/2020  . Encounter for central line placement   . Pain and swelling of ankle, left   . Pressure injury of skin 01/09/2020  . Hypertension 02/28/2017  . CIGARETTE SMOKER 04/22/2007    Resolved Hospital Problems  No resolved problems to display.    Discharge Condition: Stable  Diet recommendation: Renal/moderate carb  Vitals:   02/19/20 0427 02/19/20 0933  BP: (!) 158/63 (!) 158/78  Pulse: 77 75  Resp: 18 18  Temp: 97.6 F (36.4 C) 97.6 F (36.4 C)  SpO2: 98% 100%    History of present illness:  Cindy L Jamisonis an 74 y.o.femalewith a past medical history significant for breast cancer, tobacco use, diabetes, COPD not on home oxygen, MDD admitted to the hospital April 3 with acute renal failure requiring CRRT, hyperkalemia, anion gap metabolic acidosis secondary to DKA, acute respiratory failure requiring intubation from April 3 April 6. She has had persistent ongoing kidney failure. She is being followed by nephrology. Dialysis was started on April 27. She underwent a renal biopsy April 30 revealing ATN.  Patient will be requiring intermittent HD upon discharge, hopeful for renal recovery.    Today, patient reports feeling better, denies any worsening shortness of breath, chest pain, abdominal pain, nausea/vomiting, fever/chills.  Patient very eager to be discharged.  Advised patient to be compliant with dialysis, medications, follow-up  appointments, as well as quit smoking cigarettes.  Patient will be discharged on home health PT/OT/RN   Hospital Course:  Principal Problem:   Renal failure Active Problems:   CIGARETTE SMOKER   Hypertension   Pressure injury of skin   Encounter for central line placement   Pain and swelling of ankle, left   Hypocalcemia   Metabolic acidosis   Acute respiratory failure with hypoxia (H[facility])   UTI (urinary tract infection)   Acute hypoxic respiratory failure likely 2/2 vol overload in addition to possible COPD exacerbation Improving Likely 2/2 volume overload, in addition to COPD exacerbation Chest x-ray with volume overload D/C pt on scheduled inhalers, as needed duo neb/inhaler, tapered dose of steroids Follow-up with outpatient dialysis as well as nephrology Continue Lasix Flutter valve, incentive spirometry  Acute kidney injury 2/2 severe volume depletion in the setting of DKA, UTI/metabolic acidosis Dialysis started April 27, required urgent dialysis on 02/16/2020 due to volume overload Status post renal biopsy by interventional radiology April 30 Preliminary path report per nephrology shows ATN Follow-up nephrology  UTI Pansensitive E. coli noted on her culture Blood cultures with no growth to date S/p 7-day of Rocephin  Normocytic anemia Stable Daily CBC  Diabetes mellitus type 2  Hemoglobin A1c was 8.5, uncontrolled Continue home regimen  Left foot drop/pain and swelling of the left ankle X-ray of the left ankle negative. She did get MRI of the lumbar spine revealing L3-4 bulging and protrusion at L5 1. Evaluated by neurosurgery who recommended outpatient follow-up and possible EMG. AFO/dorsiflexion brace provided Outpatient follow-up with neurosurgery for left foot drop with possible EMG  Left arm  edema Left arm/forearm venous Doppler negative for DVT  Pressure injury coccyx stage II/bilateral heel injury Home health RN        Malnutrition  Type:    Estimated body mass index is 23.78 kg/m as calculated from the following:   Height as of this encounter: 5\' 2"  (1.575 m).   Weight as of this encounter: 59 kg.    Procedures:  Mechanical ventilation  Consultations:  Nephrology  Neurosurgery  PCCM  Discharge Exam: BP (!) 158/78 (BP Location: Right Arm)   Pulse 75   Temp 97.6 F (36.4 C) (Oral)   Resp 18   Ht 5\' 2"  (1.575 m)   Wt 59 kg   SpO2 100%   BMI 23.78 kg/m   General: NAD Cardiovascular: S1, S2 present Respiratory: Diminished breath sounds bilaterally    Discharge Instructions You were cared for by a hospitalist during your hospital stay. If you have any questions about your discharge medications or the care you received while you were in the hospital after you are discharged, you can call the unit and asked to speak with the hospitalist on call if the hospitalist that took care of you is not available. Once you are discharged, your primary care physician will handle any further medical issues. Please note that NO REFILLS for any discharge medications will be authorized once you are discharged, as it is imperative that you return to your primary care physician (or establish a relationship with a primary care physician if you do not have one) for your aftercare needs so that they can reassess your need for medications and monitor your lab values.  Discharge Instructions    Diet - low sodium heart healthy   Complete by: As directed    Increase activity slowly   Complete by: As directed      Allergies as of 02/19/2020   No Known Allergies     Medication List    TAKE these medications   albuterol 108 (90 Base) MCG/ACT inhaler Commonly known as: VENTOLIN HFA Inhale 1-2 puffs into the lungs every 4 (four) hours as needed for wheezing or shortness of breath.   amLODipine 10 MG tablet Commonly known as: NORVASC Take 1 tablet (10 mg total) by mouth daily. Start taking on: Feb 20, 2020   Breo  Ellipta 100-25 MCG/INH Aepb Generic drug: fluticasone furoate-vilanterol Inhale 1 puff into the lungs daily.   feeding supplement (NEPRO CARB STEADY) Liqd Take 237 mLs by mouth 3 (three) times daily between meals.   furosemide 40 MG tablet Commonly known as: LASIX Take 1 tablet (40 mg total) by mouth 2 (two) times daily.   ipratropium-albuterol 0.5-2.5 (3) MG/3ML Soln Commonly known as: DUONEB Take 3 mLs by nebulization every 6 (six) hours as needed.   metoprolol tartrate 25 MG tablet Commonly known as: LOPRESSOR Take 1 tablet (25 mg total) by mouth 2 (two) times daily.   nicotine 21 mg/24hr patch Commonly known as: NICODERM CQ - dosed in mg/24 hours Place 1 patch (21 mg total) onto the skin daily.   predniSONE 10 MG tablet Commonly known as: DELTASONE Take 5 tablets (50 mg total) by mouth daily with breakfast for 2 days, THEN 4 tablets (40 mg total) daily with breakfast for 2 days, THEN 3 tablets (30 mg total) daily with breakfast for 2 days, THEN 2 tablets (20 mg total) daily with breakfast for 2 days, THEN 1 tablet (10 mg total) daily with breakfast for 2 days. Start taking on: Feb 20, 2020  sitaGLIPtin 100 MG tablet Commonly known as: JANUVIA Take 100 mg by mouth daily.            Durable Medical Equipment  (From admission, onward)         Start     Ordered   02/19/20 1253  For home use only DME Nebulizer machine  Once    Question Answer Comment  Patient needs a nebulizer to treat with the following condition COPD (chronic obstructive pulmonary disease) ([place])   Length of Need Lifetime      02/19/20 1252   02/17/20 1433  For home use only DME 4 wheeled rolling walker with seat  Once    Question:  Patient needs a walker to treat with the following condition  Answer:  Decreased functional mobility and endurance   02/17/20 1432   02/17/20 1433  For home use only DME 4 wheeled rolling walker with seat  Once    Question:  Patient needs a walker to treat with the  following condition  Answer:  Foot drop   02/17/20 1432   02/08/20 1209  For home use only DME 3 n 1  Once     02/08/20 1208         No Known Allergies Follow-up Information    [facility] Follow up.   Specialty: Home Health Services Why: office will call to schedule therapy visits Contact information: [address] [place] [address] [phone]        Cindy Sanders, MD. Schedule an appointment as soon as possible for a visit.   Specialty: Family Medicine Why: for hospital follow up Contact information: [place] [address] [place]. Call.   Why: if you do not hear from an home care agency, call this number to follow up Contact information: [phone] [email or web address]       [facility] Follow up.   Why: this agency provided the rollator and the bedside commode Contact information: [place] [address] [phone]        [employer]. Call.   Why: to arrange transportation or make changes - you will get a packet in the mail that explains the transportation Contact information: [address], [place], [place] [address] [phone]       Consuella Lose, MD. Schedule an appointment as soon as possible for a visit.   Specialty: Neurosurgery Why: To follow up on your L foot drop Contact information: [address] [place] [address] [phone]            The results of significant diagnostics from this hospitalization (including imaging, microbiology, ancillary and laboratory) are listed below for reference.    Significant Diagnostic Studies: DG Chest 2 View  Result Date: 02/15/2020 CLINICAL DATA:  74 year old female with wheezing. In stage renal disease on dialysis. EXAM: CHEST - 2 VIEW COMPARISON:  Portable  chest 01/08/2020 and earlier. FINDINGS: Semi upright AP and lateral views of the chest. Extubated and enteric tube removed since last month. Dual lumen dialysis catheter now from a right IJ approach. Chronic postoperative changes to the left chest wall. Dependent pulmonary opacity compatible with small bilateral pleural effusions. Some pleural fluid tracking into the fissures on the lateral view. Superimposed bilateral increased interstitial opacity with basilar predominance, and slightly greater on the left. No pneumothorax. Mediastinal contours are within  normal limits. Visualized tracheal air column is within normal limits. Calcified aortic atherosclerosis. No acute osseous abnormality identified. Negative visible bowel gas pattern. IMPRESSION: 1. Small bilateral pleural effusions. 2. Bilateral basilar predominant increased interstitial opacity. Favor interstitial edema over viral/atypical respiratory infection. Electronically Signed   By: Genevie Ann M.D.   On: 02/15/2020 14:05   MR LUMBAR SPINE WO CONTRAST  Result Date: 01/21/2020 CLINICAL DATA:  Lumbar radiculopathy. EXAM: MRI LUMBAR SPINE WITHOUT CONTRAST TECHNIQUE: Multiplanar, multisequence MR imaging of the lumbar spine was performed. No intravenous contrast was administered. COMPARISON:  None. FINDINGS: Segmentation: 5 non rib-bearing lumbar type vertebral bodies are present. The lowest fully formed vertebral body is L5. Alignment: Limits anatomic. Some straightening of normal cervical lordosis is noted. Vertebrae: Edematous and fatty endplate marrow changes are present at L2-3. Mild degenerative changes are noted at L1-2. Marrow signal and vertebral body heights are otherwise normal. Conus medullaris and cauda equina: Conus extends to the L2 level. Conus and cauda equina appear normal. Paraspinal and other soft tissues: Limited imaging the abdomen is unremarkable. There is no significant adenopathy. No solid organ lesions are present. Disc levels: L1-2:  Negative. L2-3: Negative. L3-4: Mild disc bulging is present without significant stenosis. L4-5: A leftward disc protrusion is present. Annular tear is noted. No compressive stenosis is evident. L5-S1: A rightward disc protrusion extends into the foramen. Moderate facet hypertrophy is worse on the right. IMPRESSION: 1. Mild disc bulging at L3-4. 2. Leftward disc protrusion and annular tear. This may impact the exiting left L4 nerve root. 3. Rightward disc protrusion and annular tear at L5-S1 without significant stenosis. 4. Moderate facet hypertrophy at L5-S1 is worse on the right. Electronically Signed   By: San Morelle M.D.   On: 01/21/2020 19:05   IR Fluoro Guide CV Line Right  Result Date: 02/01/2020 CLINICAL DATA:  End-stage renal disease and need for tunneled hemodialysis catheter for dialysis. EXAM: TUNNELED CENTRAL VENOUS HEMODIALYSIS CATHETER PLACEMENT WITH ULTRASOUND AND FLUOROSCOPIC GUIDANCE ANESTHESIA/SEDATION: 2.0 mg IV Versed; 100 mcg IV Fentanyl. Total Moderate Sedation Time:   23 minutes. The patient's level of consciousness and physiologic status were continuously monitored during the procedure by Radiology nursing. MEDICATIONS: 2 g IV Ancef. FLUOROSCOPY TIME:  42 seconds.  1.0 mGy. PROCEDURE: The procedure, risks, benefits, and alternatives were explained to the patient. Questions regarding the procedure were encouraged and answered. The patient understands and consents to the procedure. A timeout was performed prior to initiating the procedure. The right neck and chest were prepped with chlorhexidine in a sterile fashion, and a sterile drape was applied covering the operative field. Maximum barrier sterile technique with sterile gowns and gloves were used for the procedure. Local anesthesia was provided with 1% lidocaine. Ultrasound was used to confirm patency of the right internal jugular vein. After creating a small venotomy incision, a 21 gauge needle was advanced into the right  internal jugular vein under direct, real-time ultrasound guidance. Ultrasound image documentation was performed. After securing guidewire access, an 8 Fr dilator was placed. A J-wire was kinked to measure appropriate catheter length. A Palindrome tunneled hemodialysis catheter measuring 23 cm from tip to cuff was chosen for placement. This was tunneled in a retrograde fashion from the chest wall to the venotomy incision. At the venotomy, serial dilatation was performed and a 16 Fr peel-away sheath was placed over a guidewire. The catheter was then placed through the sheath and the sheath removed. Final catheter positioning was confirmed and documented with a fluoroscopic  spot image. The catheter was aspirated, flushed with saline, and injected with appropriate volume heparin dwells. The venotomy incision was closed with subcuticular 4-0 Vicryl. Dermabond was applied to the incision. The catheter exit site was secured with 0-Prolene retention sutures. COMPLICATIONS: None.  No pneumothorax. FINDINGS: After catheter placement, the tip lies in the right atrium. The catheter aspirates normally and is ready for immediate use. IMPRESSION: Placement of tunneled hemodialysis catheter via the right internal jugular vein. The catheter tip lies in the right atrium. The catheter is ready for immediate use. Electronically Signed   By: Aletta Edouard M.D.   On: 02/01/2020 18:35   IR US Guide Vasc Access Right  Result Date: 02/01/2020 CLINICAL DATA:  End-stage renal disease and need for tunneled hemodialysis catheter for dialysis. EXAM: TUNNELED CENTRAL VENOUS HEMODIALYSIS CATHETER PLACEMENT WITH ULTRASOUND AND FLUOROSCOPIC GUIDANCE ANESTHESIA/SEDATION: 2.0 mg IV Versed; 100 mcg IV Fentanyl. Total Moderate Sedation Time:   23 minutes. The patient's level of consciousness and physiologic status were continuously monitored during the procedure by Radiology nursing. MEDICATIONS: 2 g IV Ancef. FLUOROSCOPY TIME:  42 seconds.  1.0  mGy. PROCEDURE: The procedure, risks, benefits, and alternatives were explained to the patient. Questions regarding the procedure were encouraged and answered. The patient understands and consents to the procedure. A timeout was performed prior to initiating the procedure. The right neck and chest were prepped with chlorhexidine in a sterile fashion, and a sterile drape was applied covering the operative field. Maximum barrier sterile technique with sterile gowns and gloves were used for the procedure. Local anesthesia was provided with 1% lidocaine. Ultrasound was used to confirm patency of the right internal jugular vein. After creating a small venotomy incision, a 21 gauge needle was advanced into the right internal jugular vein under direct, real-time ultrasound guidance. Ultrasound image documentation was performed. After securing guidewire access, an 8 Fr dilator was placed. A J-wire was kinked to measure appropriate catheter length. A Palindrome tunneled hemodialysis catheter measuring 23 cm from tip to cuff was chosen for placement. This was tunneled in a retrograde fashion from the chest wall to the venotomy incision. At the venotomy, serial dilatation was performed and a 16 Fr peel-away sheath was placed over a guidewire. The catheter was then placed through the sheath and the sheath removed. Final catheter positioning was confirmed and documented with a fluoroscopic spot image. The catheter was aspirated, flushed with saline, and injected with appropriate volume heparin dwells. The venotomy incision was closed with subcuticular 4-0 Vicryl. Dermabond was applied to the incision. The catheter exit site was secured with 0-Prolene retention sutures. COMPLICATIONS: None.  No pneumothorax. FINDINGS: After catheter placement, the tip lies in the right atrium. The catheter aspirates normally and is ready for immediate use. IMPRESSION: Placement of tunneled hemodialysis catheter via the right internal jugular  vein. The catheter tip lies in the right atrium. The catheter is ready for immediate use. Electronically Signed   By: Aletta Edouard M.D.   On: 02/01/2020 18:35   DG Chest Port 1 View  Result Date: 02/16/2020 CLINICAL DATA:  Shortness of breath EXAM: PORTABLE CHEST 1 VIEW COMPARISON:  02/15/2020 FINDINGS: Right IJ hemodialysis catheter, stable in positioning terminating at the level of the right atrium. Stable mild cardiomegaly. Atherosclerotic calcification of the aortic knob. Small bilateral pleural effusions, left greater than right. Persistent bibasilar interstitial opacities, similar to prior. No pneumothorax. IMPRESSION: No significant interval change. Small bilateral pleural effusions, left greater than right. Persistent bibasilar interstitial opacities. Electronically Signed  By: Davina Poke D.O.   On: 02/16/2020 11:59   US BIOPSY (KIDNEY)  Result Date: 02/04/2020 INDICATION: Renal failure, proteinuria and need for renal biopsy. EXAM: ULTRASOUND GUIDED CORE BIOPSY OF RIGHT KIDNEY MEDICATIONS: None. ANESTHESIA/SEDATION: Fentanyl 50 mcg IV; Versed 1.0 mg IV Moderate Sedation Time:  12 minutes. The patient was continuously monitored during the procedure by the interventional radiology nurse under my direct supervision. PROCEDURE: The procedure, risks, benefits, and alternatives were explained to the patient. Questions regarding the procedure were encouraged and answered. The patient understands and consents to the procedure. A time-out was performed prior to initiating the procedure. Ultrasound was performed of both kidneys in a prone position. The right flank region was prepped with chlorhexidine in a sterile fashion, and a sterile drape was applied covering the operative field. A sterile gown and sterile gloves were used for the procedure. Local anesthesia was provided with 1% Lidocaine. Under ultrasound guidance, 2 separate 16 gauge core biopsy samples were obtained of the right kidney at the  level of lower pole cortex. Core biopsy samples were submitted in saline. Additional ultrasound was performed. COMPLICATIONS: None immediate. FINDINGS: Solid and intact core biopsy samples were obtained. IMPRESSION: Ultrasound-guided core biopsy performed at the level of right lower pole renal cortex. Electronically Signed   By: Aletta Edouard M.D.   On: 02/04/2020 19:04   VAS [place] UPPER EXTREMITY VENOUS DUPLEX  Result Date: 02/19/2020 UPPER VENOUS STUDY  Indications: Edema Risk Factors: AKI, Dialysis,. Limitations: Unable to compress secondary to clavicle. Comparison Study: No prior study on file Performing Technologist: Sharion Dove RVS  Examination Guidelines: A complete evaluation includes B-mode imaging, spectral Doppler, color Doppler, and power Doppler as needed of all accessible portions of each vessel. Bilateral testing is considered an integral part of a complete examination. Limited examinations for reoccurring indications may be performed as noted.  Right Findings: +----------+------------+---------+-----------+----------+-------+ RIGHT     CompressiblePhasicitySpontaneousPropertiesSummary +----------+------------+---------+-----------+----------+-------+ Subclavian               Yes       Yes                      +----------+------------+---------+-----------+----------+-------+  Left Findings: +----------+------------+---------+-----------+----------+-------+ LEFT      CompressiblePhasicitySpontaneousPropertiesSummary +----------+------------+---------+-----------+----------+-------+ IJV           Full       Yes       Yes                      +----------+------------+---------+-----------+----------+-------+ Subclavian    Full       Yes       Yes                      +----------+------------+---------+-----------+----------+-------+ Axillary                 Yes       Yes                       +----------+------------+---------+-----------+----------+-------+ Brachial      Full       Yes       Yes                      +----------+------------+---------+-----------+----------+-------+ Cephalic      Full                                          +----------+------------+---------+-----------+----------+-------+  Basilic       Full                                          +----------+------------+---------+-----------+----------+-------+  Summary:  Right: No evidence of thrombosis in the subclavian.  Left: No evidence of deep vein thrombosis in the upper extremity. No evidence of superficial vein thrombosis in the upper extremity. Significant interstitial fluid noted throughout the left upper extremity.  *See table(s) above for measurements and observations.    Preliminary     Microbiology: No results found for this or any previous visit (from the past 240 hour(s)).   Labs: Basic Metabolic Panel: Recent Labs  Lab 02/15/20 0333 02/15/20 0333 02/16/20 1016 02/16/20 1016 02/16/20 1131 02/16/20 1648 02/17/20 0507 02/18/20 0542 02/19/20 0411  NA 138   < > 132*  --  131*  --  133* 130* 134*  K 4.9   < > >7.5*   < > 6.7* 4.1 4.8 5.1 3.8  CL 110   < > 103  --  104  --  99 97* 97*  CO2 15*   < > 14*  --  13*  --  20* 19* 25  GLUCOSE 118*   < > 212*  --  225*  --  231* 268* 164*  BUN 78*   < > 90*  --  93*  --  55* 76* 35*  CREATININE 6.04*   < > 6.28*  --  6.35*  --  4.06* 4.83* 2.91*  CALCIUM 7.9*   < > 9.1  --  8.8*  --  8.9 8.4* 8.3*  MG 1.9  --  1.9  --   --   --  2.0 1.8 1.8  PHOS 6.9*  --  7.1*  --   --   --  6.1* 6.1* 4.1   < > = values in this interval not displayed.   Liver Function Tests: Recent Labs  Lab 02/15/20 0333 02/16/20 1016 02/17/20 0507 02/18/20 0542 02/19/20 0411  ALBUMIN 2.6* 3.1* 3.2* 3.0* 3.2*   No results for input(s): LIPASE, AMYLASE in the last 168 hours. No results for input(s): AMMONIA in the last 168 hours. CBC: Recent Labs   Lab 02/17/20 0507 02/18/20 0542 02/19/20 0411  WBC 14.4* 11.6* 11.6*  NEUTROABS 13.1* 10.3* 9.1*  HGB 9.0* 8.2* 9.3*  HCT 29.1* 26.4* 29.5*  MCV 102.1* 100.8* 101.4*  PLT 226 215 217   Cardiac Enzymes: No results for input(s): CKTOTAL, CKMB, CKMBINDEX, TROPONINI in the last 168 hours. BNP: BNP (last 3 results) Recent Labs    01/08/20 1412  BNP 70.0    ProBNP (last 3 results) No results for input(s): PROBNP in the last 8760 hours.  CBG: Recent Labs  Lab 02/18/20 1117 02/18/20 1700 02/18/20 2154 02/19/20 0636 02/19/20 1138  GLUCAP 248* 128* 313* 102* 150*       Signed:  Alma Friendly, MD [facility] 02/19/2020, 1:11 PM

## 2020-02-19 NOTE — Progress Notes (Signed)
VASCULAR LAB PRELIMINARY  PRELIMINARY  PRELIMINARY  PRELIMINARY  Left upper extremity venous duplex completed.    Preliminary report:  See CV proc for preliminary results.  Shermon Bozzi, RVT 02/19/2020, 12:34 PM

## 2020-02-20 NOTE — Progress Notes (Signed)
DISCHARGE NOTE HOME MAMTA RIMMER to be discharged to home per MD order. Discussed prescriptions and follow up appointments with the patient. Prescriptions given to patient; Nebs machine given to patient ,as per she,,the delivery person ,explained to her how to use the nebulizer machine.medication list explained in detail. Patient verbalized understanding.  Skin clean, dry and intact without evidence of skin break down, PIV catheter discontinued . Site without signs and symptoms of complications. Dressing and pressure applied. Pt denies pain at the site currently. No complaints noted.  Patient has d temporary double lumen HD tunnel catheter.Education rendered about the care of catheter,especially no direct shower on catheter site and see to it that catheter dressing is dry clean and intact.Reminded patient to come to her dialysis center at 12 noon time this coming Tuesday 02/22/2020 and her schedule will be Tue,Th and Sat.  An After Visit Summary (AVS) was printed and given to the patient. Patient escorted via wheelchair, and discharged home via private auto.  [place], Zenon Mayo, RN

## 2020-02-21 ENCOUNTER — Encounter: Payer: Self-pay | Admitting: *Deleted

## 2020-02-21 ENCOUNTER — Other Ambulatory Visit: Payer: Self-pay | Admitting: *Deleted

## 2020-02-21 ENCOUNTER — Other Ambulatory Visit: Payer: Self-pay

## 2020-02-21 ENCOUNTER — Telehealth: Payer: Self-pay | Admitting: Physician Assistant

## 2020-02-21 NOTE — Patient Outreach (Signed)
[facility]) Care Management THN CM Telephone Outreach, new referral PCP office completes Transition of Care follow up post-hospital discharge Post-hospital discharge day # 2 [place] Unsuccessful (consecutive) Outreach attempt # 1- new referral   02/21/2020  Cindy Burns June 02, 1946 [id number]  Unsuccessful telephone outreach to [name], 74 y/o female referred to [facility] CM by inpatient RN CM on Feb 17, 2020; patient was recently hospitalized April 3- Feb 19, 2020 for Acute renal failure/ DKA/ acute respiratory failure and required initiation of hemodialysis while hospitalized.  Patient has history including, but not limited to, breast cancer; DM; COPD; depression; HTN/ HLD; hepatomegaly; and tobacco use.  Patient was discharged home to self-care with home health services through [place].  [facility] CM Telephone Outreach Care Coordination 10:20 am: contacted [facility] agency [phone]) and spoke with Jinny Blossom, who confirms that referral has been received and is in process; she confirms that PT, OT, and RN orders have been placed and that patient is not in [place] home first program; Jinny Blossom expects home health referral to be processed quickly, hopeful that first home health visit may be made by tomorrow..  10:45 am: attempted outreach calls x 2 to patient, on both numbers listed in EHR for patient  Home number: no physical answer, no voice mail pick up; unable to leave patient HIPAA compliant message requesting call back Mobile number: received outgoing voice mail stating that this number is for "Nira Conn with the [place];" noted from prior review of EHR that patient has designated "NO ONE" on DPR; also verified that there is no one by the name of "Nira Conn" listed as a contact for patient, therefore did not leave voice message requesting call back  Plan:  Will place [facility] CM unsuccessful patient outreach letter in mail  requesting call back in writing  Will re-attempt THN CM telephone outreach within 4 business days if I do not hear back from patient first  [name], RN, BSN, [employer] Coordinator [facility] Care Management  [phone]

## 2020-02-21 NOTE — Telephone Encounter (Signed)
Transition of care contact from inpatient facility  Date of discharge: 02/19/20 Date of contact: 02/21/20 Method: Phone Spoke to: Patient  Patient contacted to discuss transition of care from recent inpatient hospitalization. Patient did not answer the phone and unable to leave a message. Will continue to attempt to reach patient by phone and/or at her  Outpatient dialysis center.   Anice Paganini, PA-C 02/21/2020, 3:14 PM  [facility] Pager: [phone]

## 2020-02-22 DIAGNOSIS — N2581 Secondary hyperparathyroidism of renal origin: Secondary | ICD-10-CM | POA: Diagnosis not present

## 2020-02-22 DIAGNOSIS — D509 Iron deficiency anemia, unspecified: Secondary | ICD-10-CM | POA: Diagnosis not present

## 2020-02-22 DIAGNOSIS — T8249XA Other complication of vascular dialysis catheter, initial encounter: Secondary | ICD-10-CM | POA: Diagnosis not present

## 2020-02-22 DIAGNOSIS — Z992 Dependence on renal dialysis: Secondary | ICD-10-CM | POA: Diagnosis not present

## 2020-02-22 DIAGNOSIS — N170 Acute kidney failure with tubular necrosis: Secondary | ICD-10-CM | POA: Diagnosis not present

## 2020-02-22 DIAGNOSIS — D688 Other specified coagulation defects: Secondary | ICD-10-CM | POA: Diagnosis not present

## 2020-02-23 ENCOUNTER — Other Ambulatory Visit: Payer: Self-pay | Admitting: *Deleted

## 2020-02-23 ENCOUNTER — Telehealth: Payer: Self-pay | Admitting: *Deleted

## 2020-02-23 ENCOUNTER — Encounter: Payer: Self-pay | Admitting: *Deleted

## 2020-02-23 DIAGNOSIS — E119 Type 2 diabetes mellitus without complications: Secondary | ICD-10-CM | POA: Diagnosis not present

## 2020-02-23 DIAGNOSIS — I10 Essential (primary) hypertension: Secondary | ICD-10-CM | POA: Diagnosis not present

## 2020-02-23 DIAGNOSIS — J9601 Acute respiratory failure with hypoxia: Secondary | ICD-10-CM | POA: Diagnosis not present

## 2020-02-23 DIAGNOSIS — J449 Chronic obstructive pulmonary disease, unspecified: Secondary | ICD-10-CM | POA: Diagnosis not present

## 2020-02-23 DIAGNOSIS — N170 Acute kidney failure with tubular necrosis: Secondary | ICD-10-CM | POA: Diagnosis not present

## 2020-02-23 LAB — SURGICAL PATHOLOGY
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
## 2020-02-23 NOTE — Telephone Encounter (Signed)
Amy PT with [facility] called requesting verbal orders for physical therapy once a week for 5 weeks. This would be for strengthing, fall preventions and safety.  Amy stated that patient has not been taking Januvia since coming home from the hospital because she does not think that she needs it. Amy stated that patient does not have any Januvia at her home. Amy stated that she did advise the patient that she needed to call Dr. Diona Browner about this.

## 2020-02-24 ENCOUNTER — Encounter (HOSPITAL_COMMUNITY): Payer: Self-pay

## 2020-02-24 ENCOUNTER — Other Ambulatory Visit: Payer: Self-pay | Admitting: *Deleted

## 2020-02-24 DIAGNOSIS — D509 Iron deficiency anemia, unspecified: Secondary | ICD-10-CM | POA: Diagnosis not present

## 2020-02-24 DIAGNOSIS — Z992 Dependence on renal dialysis: Secondary | ICD-10-CM | POA: Diagnosis not present

## 2020-02-24 DIAGNOSIS — N179 Acute kidney failure, unspecified: Secondary | ICD-10-CM | POA: Diagnosis not present

## 2020-02-24 DIAGNOSIS — D688 Other specified coagulation defects: Secondary | ICD-10-CM | POA: Diagnosis not present

## 2020-02-24 DIAGNOSIS — T8249XA Other complication of vascular dialysis catheter, initial encounter: Secondary | ICD-10-CM | POA: Diagnosis not present

## 2020-02-24 DIAGNOSIS — N2581 Secondary hyperparathyroidism of renal origin: Secondary | ICD-10-CM | POA: Diagnosis not present

## 2020-02-24 DIAGNOSIS — N170 Acute kidney failure with tubular necrosis: Secondary | ICD-10-CM | POA: Diagnosis not present

## 2020-02-24 NOTE — Telephone Encounter (Signed)
Tried to call Amy with [facility].  Voicemail is not set up so I was unable to leave a message.

## 2020-02-24 NOTE — Telephone Encounter (Signed)
Verbal orders given to Amy with [facility] for physical therapy once a week for 5 weeks, for strengthing, fall preventions and safety. I also advised Amy that patient has an office visit with Dr. Diona Browner tomorrow and she will address the medication concerns at that time.

## 2020-02-24 NOTE — Telephone Encounter (Signed)
Okay to provide verbal orders as requested.  She has appt on 02/25/2020 we will discuss meds then.
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
## 2020-02-25 ENCOUNTER — Encounter: Payer: Self-pay | Admitting: Family Medicine

## 2020-02-25 ENCOUNTER — Other Ambulatory Visit: Payer: Self-pay

## 2020-02-25 ENCOUNTER — Ambulatory Visit (INDEPENDENT_AMBULATORY_CARE_PROVIDER_SITE_OTHER): Payer: Medicare Other | Admitting: Family Medicine

## 2020-02-25 VITALS — BP 150/60 | HR 73 | Temp 98.7°F | Ht 61.5 in | Wt 117.5 lb

## 2020-02-25 DIAGNOSIS — N179 Acute kidney failure, unspecified: Secondary | ICD-10-CM | POA: Diagnosis not present

## 2020-02-25 DIAGNOSIS — J449 Chronic obstructive pulmonary disease, unspecified: Secondary | ICD-10-CM | POA: Diagnosis not present

## 2020-02-25 DIAGNOSIS — E0842 Diabetes mellitus due to underlying condition with diabetic polyneuropathy: Secondary | ICD-10-CM

## 2020-02-25 DIAGNOSIS — J9601 Acute respiratory failure with hypoxia: Secondary | ICD-10-CM | POA: Diagnosis not present

## 2020-02-25 DIAGNOSIS — Z992 Dependence on renal dialysis: Secondary | ICD-10-CM

## 2020-02-25 DIAGNOSIS — N3000 Acute cystitis without hematuria: Secondary | ICD-10-CM

## 2020-02-25 DIAGNOSIS — F331 Major depressive disorder, recurrent, moderate: Secondary | ICD-10-CM | POA: Diagnosis not present

## 2020-02-25 DIAGNOSIS — I10 Essential (primary) hypertension: Secondary | ICD-10-CM | POA: Diagnosis not present

## 2020-02-25 DIAGNOSIS — E119 Type 2 diabetes mellitus without complications: Secondary | ICD-10-CM | POA: Diagnosis not present

## 2020-02-25 DIAGNOSIS — N170 Acute kidney failure with tubular necrosis: Secondary | ICD-10-CM | POA: Diagnosis not present

## 2020-02-25 MED ORDER — SITAGLIPTIN PHOSPHATE 25 MG PO TABS: 100.0000 mg | ORAL_TABLET | Freq: Every day | ORAL | 11 refills | Status: DC

## 2020-02-25 MED ORDER — VENLAFAXINE HCL ER 37.5 MG PO CP24: 37.5000 mg | ORAL_CAPSULE | Freq: Every day | ORAL | 5 refills | Status: DC

## 2020-02-25 NOTE — Patient Instructions (Addendum)
Restart venlafaxine at lower dose.  Restart Januvia at 25 mg daily.  We call to set up clinical pharmacist to review your medication on dialysis  Follow blood pressure at home daily.  Ask at dialysis where to find Nepro supplement.  Call to make neurosurgery appt.
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
## 2020-02-26 DIAGNOSIS — D509 Iron deficiency anemia, unspecified: Secondary | ICD-10-CM | POA: Diagnosis not present

## 2020-02-26 DIAGNOSIS — N170 Acute kidney failure with tubular necrosis: Secondary | ICD-10-CM | POA: Diagnosis not present

## 2020-02-26 DIAGNOSIS — Z992 Dependence on renal dialysis: Secondary | ICD-10-CM | POA: Diagnosis not present

## 2020-02-26 DIAGNOSIS — N2581 Secondary hyperparathyroidism of renal origin: Secondary | ICD-10-CM | POA: Diagnosis not present

## 2020-02-26 DIAGNOSIS — D688 Other specified coagulation defects: Secondary | ICD-10-CM | POA: Diagnosis not present

## 2020-02-26 DIAGNOSIS — T8249XA Other complication of vascular dialysis catheter, initial encounter: Secondary | ICD-10-CM | POA: Diagnosis not present

## 2020-02-28 ENCOUNTER — Telehealth: Payer: Self-pay | Admitting: *Deleted

## 2020-02-28 ENCOUNTER — Other Ambulatory Visit: Payer: Self-pay | Admitting: *Deleted

## 2020-02-28 ENCOUNTER — Encounter: Payer: Self-pay | Admitting: *Deleted

## 2020-02-28 NOTE — Telephone Encounter (Addendum)
[facility] from [facility] stating insurance will only cover 1 tablet daily of the [place].  Rx was sent in was for Januvia 25 mg to take 4 tablets daily.  Please advise.

## 2020-02-28 NOTE — Patient Outreach (Signed)
Cindy Burns) Care Management [facility] CM Telephone Outreach  PCP office completes Transition of Care follow up post- hospital discharge Post-hospital discharge day # 9   02/28/2020  Cindy Burns Oct 18, 1945 [id number]  Successful telephone outreach to Cindy Burns, 74 y/o female referred to [facility] CM byinpatient RN CMon Feb 17, 2020; patient was recently hospitalized April 3- Feb 19, 2020 for acute renal failure/ DKA/ acute respiratory failure and required initiation of hemodialysis while hospitalized.Patient has history including, but not limited to, breast cancer; DM; COPD; depression; HTN/ HLD; hepatomegaly; and tobacco use. Patient was discharged home to self-care with home health services through [place].  HIPAA/ identity verified; patient reports "doing fine" and she denies pain and new/ recent falls and continues using cane for ambulation.  She sounds to be in no distress throughout phone call today.   Patient further reports:  Medications: -- no concerns around medications; acknowledges that she obtained newly prescribed anti-depressant post recent PCP office visit on 02/25/20; reports that her insurance company has not provided authorization for re-start of her Januvia; states that he outpatient pharmacy has contacted PCP office to clarify ---- medications reviewed post- recent PCP appointment; no concerns/ discrepancies identified, other than Januvia as noted above- confirmed through review of EHR that PCP is aware of insurance issue with Januvia coverage; medication list in EHR updated accordingly ---- continues to self-manage medications; able to accurately verbalize current dosing for prednisone based on post-hospital discharge instructions  Home health [facility]) services: -- [facility] services for PT, OT, RN in place through [facility]; states home health OT has signed off, stating no ongoing OT needs post-hospital discharge; enocuraged patient's ongoing active participation in  home health services and again confirmed that she has contact information for home health team; discussed/ reiterated difference between home health/ [facility] CM services  Provider appointments: -- Patient has no upcoming scheduled upcoming provider appointments; need to schedule prompt appointment with neurosurgery provider as instructed post-hospital discharge; confirmed that patient has contact information for neurosurgery provider; encouraged patient to also schedule next PCP office visit, as she tells me that PCP wants to follow up after last appointment "in 2 weeks" ---- confirmed that patient continues to deny transportation needs  [employer] needs: -- continues to deny community resource needs, stating supportive friends that visit her regularly and are able to assist in care needs;  she confirms that she has medicaid transportation with [employer] for her hemodialysis sessions on T/ TH/ Sa -- states she has contacted [facility] to initiate personal care services as instructed post-hospital discharge; states that she "spoke with someone" and tells me she "has no idea" what the outcome of her conversation was, nor with whom she spoke to- discussed with patient referral to [facility] CSW to assist in facilitation of having personal care services through [place] initiated; patient is agreeable to this- will place [facility] CSW referral -- has a vehicle and has resumed driving but again tells me that her "tags are out of date;" encouraged patient to discuss transportation resource options available to her with [facility] CSW once outreach is established, and she is agreeable  Self-health management of renal failure requiring new hemodialysis/ DKA/ acute respiratory failure: -- has not missed any hemodialysis sessions post-hospital discharge.  Reports tolerating hemodialysis sessions "fine;" reiterated with patient importance of attending all hemodialysis sessions as scheduled; today she tells me  that she "thinks" she "may not have to be on hemodialysis forever," as she continues making her own urine;  she tells me there is a doctor at the hemodialysis [facility] that she spoke with, but she does not know his name, and is not sure if this doctor will make the decision around her need for ongoing hemodialysis; encouraged patient to ask about this during tomorrow's hemodialysis sessions and she is agreeable; reiterated role of [facility] RN CM, and explained that should she need assistance in following up on this, I can facilitate with process -- has information around renal diet, reports "trying" to follow as "best as I can;" continues to deny food insecurity; reports good appetite -- has not been able to obtain Nepro supplement; states she spoke with renal team at hemodialysis [facility] who told her to check [facility]; states she plans to do "soon." -- has not smoked any additional cigarettes since our conversation last week- positive reinforcement provided -- continues monitoring and recording morning fasting blood sugars at home once daily: reviewed recently recorded blood sugars; patient reports recent fasting blood sugars as: "157; 146; 153; 212; 232; and this morning: 157"  Patient denies further issues, concerns, or problems today. I re-provided/ confirmed that patient hasmy direct phone number, the main [facility] CM office phone number, and the [facility] CM 24-hour nurse advice phone number should issues arise prior to next scheduled [facility] CM outreach.  Encouraged patient to contact me directly if needs, questions, issues, or concerns arise prior to next scheduled outreach; patient agreed to do so.  Plan:  Patient will take medications as prescribed and will attend all scheduled provider and hemodialysis appointments  Patient will promptly notify care providers for any new concerns/ issues/ problems that arise  Patient will promptly make neurosurgery provider and PCP office visits  Patient will actively  participate in home health services as ordered post-hospital discharge  Patient will continue monitoring/ recording fasting morning daily blood sugars  I will place [facility] CSW referral for facilitation of medicaid benefit for personal care services; transportation resource options available  [facility] CM outreach to continue with scheduled phone call in 2 weeks, sooner if indicated  [facility] CM Care Plan Problem One     Most Recent Value  Care Plan Problem One  High Risk for hospital readmission related to/ as evidenced by extended hospitalization with discharge home to self-care in elderly patient that lives alone and is new to hemodialysis  Role Documenting the Problem One  Care Management Coordinator  Care Plan for Problem One  Active  [facility] Long Term Goal   Over the next 31 days, patient will not experience hospital re-admission as evidenced by patient reporting and review of EHR during [facility] RN CM outreach  [facility] Long Term Goal Start Date  02/23/20  Interventions for Problem One Long Term Goal  Discussed with patient her current clinical condition and confirmed that she denies current clinical concerns,  reviewed recent provider/ hemodialysis appointments with patient,  reviewed medications with patient post- recent PCP office visit,  placed [facility] CSW referral to facilitate initiation of personal care services through medicaid benefit, as ordered post-hospital discharge  [facility] CM Short Term Goal #1   Over the next 30 days, patient will actively participate in home health services as evidenced by patient reporting and collaboration with home health team as indciated during [place] outreach  [facility] CM Short Term Goal #1 Start Date  02/23/20  Interventions for Short Term Goal #1  Reviewed recent home health visits with patient and confirmed that patient continues actively participating in home health services  [facility] CM Short  Term Goal #2   Over the next 30 days, patient will attend all scheduled hemodialysis and  provider appointments, as evidenced by patient reporting and collaboration with care teams as indicated during [place] outreach  [facility] CM Short Term Goal #2 Start Date  02/23/20  Interventions for Short Term Goal #2  Confirmed that patient has accurate understanding of post-recent PCP office visit instructions,  encouraged patient to promptly schedule next PCP office visit in 2 weeks and to schedule neurosurgery provider office visit, as recommended at time of hospital discharge     [name], RN, BSN, [employer] Coordinator [facility] Care Management  [phone]

## 2020-02-29 DIAGNOSIS — T8249XA Other complication of vascular dialysis catheter, initial encounter: Secondary | ICD-10-CM | POA: Diagnosis not present

## 2020-02-29 DIAGNOSIS — N2581 Secondary hyperparathyroidism of renal origin: Secondary | ICD-10-CM | POA: Diagnosis not present

## 2020-02-29 DIAGNOSIS — N170 Acute kidney failure with tubular necrosis: Secondary | ICD-10-CM | POA: Diagnosis not present

## 2020-02-29 DIAGNOSIS — D688 Other specified coagulation defects: Secondary | ICD-10-CM | POA: Diagnosis not present

## 2020-02-29 DIAGNOSIS — N179 Acute kidney failure, unspecified: Secondary | ICD-10-CM | POA: Diagnosis not present

## 2020-02-29 DIAGNOSIS — D509 Iron deficiency anemia, unspecified: Secondary | ICD-10-CM | POA: Diagnosis not present

## 2020-02-29 DIAGNOSIS — Z992 Dependence on renal dialysis: Secondary | ICD-10-CM | POA: Diagnosis not present

## 2020-02-29 MED ORDER — SITAGLIPTIN PHOSPHATE 25 MG PO TABS: 25.0000 mg | ORAL_TABLET | Freq: Every day | ORAL | 11 refills | Status: DC

## 2020-02-29 NOTE — Telephone Encounter (Signed)
[facility] as instructed by Dr. Diona Browner.

## 2020-02-29 NOTE — Telephone Encounter (Signed)
That was an error... should be one tablet once daily. She is on dialysis. Please send in corrected rx.

## 2020-03-01 ENCOUNTER — Encounter: Payer: Self-pay | Admitting: *Deleted

## 2020-03-01 ENCOUNTER — Other Ambulatory Visit: Payer: Self-pay | Admitting: *Deleted

## 2020-03-01 DIAGNOSIS — I10 Essential (primary) hypertension: Secondary | ICD-10-CM | POA: Diagnosis not present

## 2020-03-01 DIAGNOSIS — J9601 Acute respiratory failure with hypoxia: Secondary | ICD-10-CM | POA: Diagnosis not present

## 2020-03-01 DIAGNOSIS — J449 Chronic obstructive pulmonary disease, unspecified: Secondary | ICD-10-CM | POA: Diagnosis not present

## 2020-03-01 DIAGNOSIS — E119 Type 2 diabetes mellitus without complications: Secondary | ICD-10-CM | POA: Diagnosis not present

## 2020-03-01 DIAGNOSIS — N170 Acute kidney failure with tubular necrosis: Secondary | ICD-10-CM | POA: Diagnosis not present

## 2020-03-01 NOTE — Patient Outreach (Signed)
[facility]) Care Management  03/01/2020  DAILY Cindy Burns Sep 05, 1946 [id number]   CSW was able to make initial contact with patient today to perform phone assessment, as well as assess and assist with social work needs and services.  CSW introduced self, explained role and types of services provided through [facility] ([place] [facility]).  CSW further explained to patient that CSW works with patient's RNCM, also with [place] [facility], Reginia Naas.  CSW then explained the reason for the call, indicating that Cindy Burns thought that patient would benefit from social work services and resources to assist with arrangement of personal care services, transportation to and from dialysis treatments, as well as physician appointments and counseling and supportive services for symptoms of Depression.  CSW obtained two HIPAA compliant identifiers from patient, which included patient's name and date of birth.  Patient verbalized being hospitalized for Acute Renal Failure, Diabetic Ketoacidosis and Acute Respiratory Failure, from Saturday, January 08, 2020 until Saturday, Feb 19, 2020, for a total of 6 weeks.  While hospitalized, an application for [employer]CS ([place]) was initiated for patient and submitted to [employer] for processing.  Patient admitted to receiving a letter in the mail from [employer], just this morning, in response to her application.  The letter advised patient to contact [employer] within the next 30 days to schedule a telephone interview and home assessment.  Patient reported that she plans to contact [employer] today to schedule her interview and assessment.  Upon discharge from the hospital, home health physical therapy, occupational therapy and nursing services were arranged for patient through [place].     Patient denied the need for transportation  services or resources, reporting that she currently uses [employer], as well as [employer], to get to and from her physician appointments and dialysis treatments (Tuesday's, Thursday's and Saturday's).  CSW reminded patient that she can also utilize [employer], providing her with the contact information and process for establishing services.  Patient indicated that she owns her own vehicle and would actually prefer to drive herself, but needs to make a trip to the [place] [employer] to get tags renewed.  Patient admitted that the social worker at the dialysis clinic has been assisting her quite a bit, with completion of applications and such, and that she feels confident that she can continue to utilize the social worker to assist her with any future needs, concerns, resources, referrals, etc.    Patient endorses that she is taking all of her medications exactly as prescribed and that she was recently (Friday, Feb 25, 2020) started back on her antidepressant medication, Effexor-XR 25 mg, po, daily, by her Primary Care Physician, Dr. Eliezer Lofts.  Patient denied the need for counseling and supportive services, admitting that she has a great support system, through family members, friends and her church community.  Patient further reported that she also relies heavily on her Faith.  CSW offered to provide patient with free telephonic counseling services, or provide her with a list of therapists and psychiatrists in the community; however, patient declined.  Patient admitted that her symptoms of depression seem to be well-controlled, as long as she is taking the Effexor-XR.  Patient denies experiencing homicidal or suicidal ideations, nor is patient a victim of domestic violence, exploitation or neglect.  Patient denied ever having food insecurities or being unable to afford prescription medications.  CSW will perform a  case closure on patient, as all goals of  treatment have been met from social work standpoint and no additional social work needs have been identified at this time.  CSW will notify patient's RNCM with [facility], Reginia Naas of CSW's plans to close patient's case.  CSW will fax an update to patient's Primary Care Physician, Dr. Eliezer Lofts to ensure that she is aware of CSW's involvement with patient's plan of care.  CSW was able to confirm that patient has the correct contact information for CSW, encouraging patient to contact CSW directly if additional social work needs arise in the near future, or if patient changes her mind about wanting to receive counseling services.  Patient voiced understanding and was agreeable to this plan.   Cindy Burns, BSW, MSW, LCSW  Licensed Education officer, [facility]  Mailing [place] [address] [place], [place] [address] Physical Address-[address], [place], [place] [address] Toll Free Main # [phone] Fax # [phone] Cell # [phone]  Office # [phone] [email or web address]

## 2020-03-02 ENCOUNTER — Telehealth: Payer: Self-pay | Admitting: Family Medicine

## 2020-03-02 DIAGNOSIS — E46 Unspecified protein-calorie malnutrition: Secondary | ICD-10-CM

## 2020-03-02 DIAGNOSIS — M21372 Foot drop, left foot: Secondary | ICD-10-CM

## 2020-03-02 DIAGNOSIS — Z87440 Personal history of urinary (tract) infections: Secondary | ICD-10-CM

## 2020-03-02 DIAGNOSIS — N170 Acute kidney failure with tubular necrosis: Secondary | ICD-10-CM | POA: Diagnosis not present

## 2020-03-02 DIAGNOSIS — D509 Iron deficiency anemia, unspecified: Secondary | ICD-10-CM | POA: Diagnosis not present

## 2020-03-02 DIAGNOSIS — Z7952 Long term (current) use of systemic steroids: Secondary | ICD-10-CM

## 2020-03-02 DIAGNOSIS — Z853 Personal history of malignant neoplasm of breast: Secondary | ICD-10-CM

## 2020-03-02 DIAGNOSIS — D688 Other specified coagulation defects: Secondary | ICD-10-CM | POA: Diagnosis not present

## 2020-03-02 DIAGNOSIS — J9811 Atelectasis: Secondary | ICD-10-CM

## 2020-03-02 DIAGNOSIS — T8249XA Other complication of vascular dialysis catheter, initial encounter: Secondary | ICD-10-CM | POA: Diagnosis not present

## 2020-03-02 DIAGNOSIS — M4727 Other spondylosis with radiculopathy, lumbosacral region: Secondary | ICD-10-CM

## 2020-03-02 DIAGNOSIS — F329 Major depressive disorder, single episode, unspecified: Secondary | ICD-10-CM

## 2020-03-02 DIAGNOSIS — Z9181 History of falling: Secondary | ICD-10-CM

## 2020-03-02 DIAGNOSIS — E785 Hyperlipidemia, unspecified: Secondary | ICD-10-CM

## 2020-03-02 DIAGNOSIS — Z7951 Long term (current) use of inhaled steroids: Secondary | ICD-10-CM

## 2020-03-02 DIAGNOSIS — F17210 Nicotine dependence, cigarettes, uncomplicated: Secondary | ICD-10-CM

## 2020-03-02 DIAGNOSIS — I359 Nonrheumatic aortic valve disorder, unspecified: Secondary | ICD-10-CM

## 2020-03-02 DIAGNOSIS — M5116 Intervertebral disc disorders with radiculopathy, lumbar region: Secondary | ICD-10-CM

## 2020-03-02 DIAGNOSIS — M48061 Spinal stenosis, lumbar region without neurogenic claudication: Secondary | ICD-10-CM

## 2020-03-02 DIAGNOSIS — N2581 Secondary hyperparathyroidism of renal origin: Secondary | ICD-10-CM | POA: Diagnosis not present

## 2020-03-02 DIAGNOSIS — I10 Essential (primary) hypertension: Secondary | ICD-10-CM | POA: Diagnosis not present

## 2020-03-02 DIAGNOSIS — D649 Anemia, unspecified: Secondary | ICD-10-CM

## 2020-03-02 DIAGNOSIS — J449 Chronic obstructive pulmonary disease, unspecified: Secondary | ICD-10-CM | POA: Diagnosis not present

## 2020-03-02 DIAGNOSIS — E119 Type 2 diabetes mellitus without complications: Secondary | ICD-10-CM | POA: Diagnosis not present

## 2020-03-02 DIAGNOSIS — Z7984 Long term (current) use of oral hypoglycemic drugs: Secondary | ICD-10-CM

## 2020-03-02 DIAGNOSIS — Z992 Dependence on renal dialysis: Secondary | ICD-10-CM | POA: Diagnosis not present

## 2020-03-02 DIAGNOSIS — J9601 Acute respiratory failure with hypoxia: Secondary | ICD-10-CM | POA: Diagnosis not present

## 2020-03-03 ENCOUNTER — Ambulatory Visit: Payer: Medicare Other | Admitting: *Deleted

## 2020-03-04 ENCOUNTER — Other Ambulatory Visit: Payer: Self-pay | Admitting: Family Medicine

## 2020-03-04 DIAGNOSIS — Z992 Dependence on renal dialysis: Secondary | ICD-10-CM | POA: Diagnosis not present

## 2020-03-04 DIAGNOSIS — D688 Other specified coagulation defects: Secondary | ICD-10-CM | POA: Diagnosis not present

## 2020-03-04 DIAGNOSIS — D509 Iron deficiency anemia, unspecified: Secondary | ICD-10-CM | POA: Diagnosis not present

## 2020-03-04 DIAGNOSIS — N179 Acute kidney failure, unspecified: Secondary | ICD-10-CM | POA: Diagnosis not present

## 2020-03-04 DIAGNOSIS — N170 Acute kidney failure with tubular necrosis: Secondary | ICD-10-CM | POA: Diagnosis not present

## 2020-03-04 DIAGNOSIS — T8249XA Other complication of vascular dialysis catheter, initial encounter: Secondary | ICD-10-CM | POA: Diagnosis not present

## 2020-03-04 DIAGNOSIS — N2581 Secondary hyperparathyroidism of renal origin: Secondary | ICD-10-CM | POA: Diagnosis not present

## 2020-03-07 DIAGNOSIS — Z992 Dependence on renal dialysis: Secondary | ICD-10-CM | POA: Diagnosis not present

## 2020-03-07 DIAGNOSIS — N2581 Secondary hyperparathyroidism of renal origin: Secondary | ICD-10-CM | POA: Diagnosis not present

## 2020-03-07 DIAGNOSIS — D689 Coagulation defect, unspecified: Secondary | ICD-10-CM | POA: Diagnosis not present

## 2020-03-07 DIAGNOSIS — N170 Acute kidney failure with tubular necrosis: Secondary | ICD-10-CM | POA: Diagnosis not present

## 2020-03-07 DIAGNOSIS — D688 Other specified coagulation defects: Secondary | ICD-10-CM | POA: Diagnosis not present

## 2020-03-07 DIAGNOSIS — T8249XA Other complication of vascular dialysis catheter, initial encounter: Secondary | ICD-10-CM | POA: Diagnosis not present

## 2020-03-07 DIAGNOSIS — D509 Iron deficiency anemia, unspecified: Secondary | ICD-10-CM | POA: Diagnosis not present

## 2020-03-07 NOTE — Progress Notes (Signed)
This note is not being shared with the patient for the following reason: To respect privacy (The patient or proxy has requested that the information not be shared).   Earney [place] Up[employer] Scheduler

## 2020-03-08 DIAGNOSIS — J449 Chronic obstructive pulmonary disease, unspecified: Secondary | ICD-10-CM | POA: Diagnosis not present

## 2020-03-08 DIAGNOSIS — N170 Acute kidney failure with tubular necrosis: Secondary | ICD-10-CM | POA: Diagnosis not present

## 2020-03-08 DIAGNOSIS — J9601 Acute respiratory failure with hypoxia: Secondary | ICD-10-CM | POA: Diagnosis not present

## 2020-03-08 DIAGNOSIS — E119 Type 2 diabetes mellitus without complications: Secondary | ICD-10-CM | POA: Diagnosis not present

## 2020-03-08 DIAGNOSIS — I10 Essential (primary) hypertension: Secondary | ICD-10-CM | POA: Diagnosis not present

## 2020-03-09 DIAGNOSIS — Z992 Dependence on renal dialysis: Secondary | ICD-10-CM | POA: Diagnosis not present

## 2020-03-09 DIAGNOSIS — N179 Acute kidney failure, unspecified: Secondary | ICD-10-CM | POA: Diagnosis not present

## 2020-03-09 DIAGNOSIS — T8249XA Other complication of vascular dialysis catheter, initial encounter: Secondary | ICD-10-CM | POA: Diagnosis not present

## 2020-03-09 DIAGNOSIS — D688 Other specified coagulation defects: Secondary | ICD-10-CM | POA: Diagnosis not present

## 2020-03-09 DIAGNOSIS — D689 Coagulation defect, unspecified: Secondary | ICD-10-CM | POA: Diagnosis not present

## 2020-03-09 DIAGNOSIS — N2581 Secondary hyperparathyroidism of renal origin: Secondary | ICD-10-CM | POA: Diagnosis not present

## 2020-03-09 DIAGNOSIS — N170 Acute kidney failure with tubular necrosis: Secondary | ICD-10-CM | POA: Diagnosis not present

## 2020-03-09 DIAGNOSIS — D509 Iron deficiency anemia, unspecified: Secondary | ICD-10-CM | POA: Diagnosis not present

## 2020-03-10 ENCOUNTER — Other Ambulatory Visit: Payer: Self-pay

## 2020-03-10 ENCOUNTER — Encounter: Payer: Self-pay | Admitting: Family Medicine

## 2020-03-10 ENCOUNTER — Ambulatory Visit (INDEPENDENT_AMBULATORY_CARE_PROVIDER_SITE_OTHER): Payer: Medicare Other | Admitting: Family Medicine

## 2020-03-10 VITALS — BP 104/50 | HR 67 | Temp 98.5°F | Ht 61.5 in | Wt 111.8 lb

## 2020-03-10 DIAGNOSIS — E1159 Type 2 diabetes mellitus with other circulatory complications: Secondary | ICD-10-CM | POA: Diagnosis not present

## 2020-03-10 DIAGNOSIS — I152 Hypertension secondary to endocrine disorders: Secondary | ICD-10-CM

## 2020-03-10 DIAGNOSIS — I10 Essential (primary) hypertension: Secondary | ICD-10-CM

## 2020-03-10 DIAGNOSIS — N186 End stage renal disease: Secondary | ICD-10-CM | POA: Diagnosis not present

## 2020-03-10 DIAGNOSIS — F331 Major depressive disorder, recurrent, moderate: Secondary | ICD-10-CM

## 2020-03-10 DIAGNOSIS — E0842 Diabetes mellitus due to underlying condition with diabetic polyneuropathy: Secondary | ICD-10-CM

## 2020-03-10 NOTE — Assessment & Plan Note (Signed)
Improved control back on venlafaxine.

## 2020-03-10 NOTE — Assessment & Plan Note (Addendum)
Fluctuating with dialysis. Continue currents meds.

## 2020-03-10 NOTE — Patient Instructions (Signed)
Tylenol for pain in legs as needed. Continue healthy eating, do not skip meals.  Work on leg strengthening.

## 2020-03-11 DIAGNOSIS — T8249XA Other complication of vascular dialysis catheter, initial encounter: Secondary | ICD-10-CM | POA: Diagnosis not present

## 2020-03-11 DIAGNOSIS — D509 Iron deficiency anemia, unspecified: Secondary | ICD-10-CM | POA: Diagnosis not present

## 2020-03-11 DIAGNOSIS — D689 Coagulation defect, unspecified: Secondary | ICD-10-CM | POA: Diagnosis not present

## 2020-03-11 DIAGNOSIS — Z992 Dependence on renal dialysis: Secondary | ICD-10-CM | POA: Diagnosis not present

## 2020-03-11 DIAGNOSIS — N2581 Secondary hyperparathyroidism of renal origin: Secondary | ICD-10-CM | POA: Diagnosis not present

## 2020-03-11 DIAGNOSIS — D688 Other specified coagulation defects: Secondary | ICD-10-CM | POA: Diagnosis not present

## 2020-03-11 DIAGNOSIS — N170 Acute kidney failure with tubular necrosis: Secondary | ICD-10-CM | POA: Diagnosis not present

## 2020-03-13 ENCOUNTER — Encounter: Payer: Self-pay | Admitting: *Deleted

## 2020-03-13 ENCOUNTER — Other Ambulatory Visit: Payer: Self-pay | Admitting: *Deleted

## 2020-03-13 NOTE — Patient Outreach (Signed)
[place] [facility]) Care Management [place] Telephone Outreach PCP office completes Transition of Care follow up post-hospital discharge Post-hospital discharge day # 23 Unsuccessful (consecutive) outreach attempt # 1- previously engaged patient  03/13/2020  [name] 1946-02-20 [id number]  Unsuccessful telephone outreach to Cindy Burns, 74 y/o female referred to [facility] CM byinpatient RN CMon Feb 17, 2020; patient was recently hospitalized April 3- Feb 19, 2020 foracute renal failure/ DKA/ acute respiratory failure and required initiation of hemodialysis while hospitalized.Patient has history including, but not limited to, breast cancer; DM; COPD; depression; HTN/ HLD; hepatomegaly; and tobacco use. Patient was discharged home to self-care with home health services through [facility].  HIPAA compliant voice mail message left for patient, requesting return call back.  Plan:  Will re-attempt [facility] CM telephone outreach within 4 business days if I do not hear back from patient first.  [name], RN, BSN, [place] Coordinator [facility] Care Management  [phone]

## 2020-03-14 DIAGNOSIS — D688 Other specified coagulation defects: Secondary | ICD-10-CM | POA: Diagnosis not present

## 2020-03-14 DIAGNOSIS — Z992 Dependence on renal dialysis: Secondary | ICD-10-CM | POA: Diagnosis not present

## 2020-03-14 DIAGNOSIS — D509 Iron deficiency anemia, unspecified: Secondary | ICD-10-CM | POA: Diagnosis not present

## 2020-03-14 DIAGNOSIS — N2581 Secondary hyperparathyroidism of renal origin: Secondary | ICD-10-CM | POA: Diagnosis not present

## 2020-03-14 DIAGNOSIS — D689 Coagulation defect, unspecified: Secondary | ICD-10-CM | POA: Diagnosis not present

## 2020-03-14 DIAGNOSIS — T8249XA Other complication of vascular dialysis catheter, initial encounter: Secondary | ICD-10-CM | POA: Diagnosis not present

## 2020-03-14 DIAGNOSIS — N179 Acute kidney failure, unspecified: Secondary | ICD-10-CM | POA: Diagnosis not present

## 2020-03-14 DIAGNOSIS — N170 Acute kidney failure with tubular necrosis: Secondary | ICD-10-CM | POA: Diagnosis not present

## 2020-03-15 ENCOUNTER — Encounter: Payer: Self-pay | Admitting: *Deleted

## 2020-03-15 ENCOUNTER — Other Ambulatory Visit: Payer: Self-pay | Admitting: *Deleted

## 2020-03-15 DIAGNOSIS — N170 Acute kidney failure with tubular necrosis: Secondary | ICD-10-CM | POA: Diagnosis not present

## 2020-03-15 DIAGNOSIS — J449 Chronic obstructive pulmonary disease, unspecified: Secondary | ICD-10-CM | POA: Diagnosis not present

## 2020-03-15 DIAGNOSIS — J9601 Acute respiratory failure with hypoxia: Secondary | ICD-10-CM | POA: Diagnosis not present

## 2020-03-15 DIAGNOSIS — E119 Type 2 diabetes mellitus without complications: Secondary | ICD-10-CM | POA: Diagnosis not present

## 2020-03-15 DIAGNOSIS — I10 Essential (primary) hypertension: Secondary | ICD-10-CM | POA: Diagnosis not present

## 2020-03-15 NOTE — Patient Outreach (Signed)
[facility] ([facility]) Care Management [facility] CM Telephone Outreach  PCP completes Transition of Care follow up post-hospital discharge Post-hospital discharge day # 25  03/15/2020  Cindy Burns 07/03/1946 [id number]  Successful telephone outreach to Cindy Burns, 74 y/o female referred to [facility] CM byinpatient RN CMon Feb 17, 2020; patient was recently hospitalized April 3- Feb 19, 2020 foracute renal failure/ DKA/ acute respiratory failure and required initiation of hemodialysis while hospitalized.Patient has history including, but not limited to, breast cancer; DM; COPD; depression; HTN/ HLD; hepatomegaly; and tobacco use. Patient was discharged home to self-care with home health services through [facility].  HIPAA/ identity verified; patient reports "doing good" and she denies pain and new/ recent falls and continues using cane for ambulation.  She sounds to be in no distress throughout phone call today.   Patient further reports:  --no concerns around medications; continues taking all as prescribed and continues self-managing/ taking independently -- home health services continue, expects services to end "next week;" endorses active participation in home health services and states PT "has helped;" encouraged patient to continue completing exercises learned with home health PT even when services have ended and she verbalizes agreement with same -- attended recent PCP office visit 03/10/20; reports visit went "well" and confirms that no changes were made to medications or overall plan of care ---- reports has planned visit with renal provider "next week" at hemodialysis center; coached patient around talking points for provider, as she reports she believes that she will not have to be on hemodialysis "forever," but "remains "unsure" -- confirms that she has spoken with [facility] CSW; denies ongoing community resource needs; discussed personal care services which were recommended at time of  hospital discharge, however, patient states she has decided "not to use" this benefit, stating that she "feels able to do everything" she needs to do at home, for herself.  Encouraged patient to re-consider this benefit, but she appears to have made her mind up to not use personal care services  Self-health management ofrenal failure requiring new hemodialysis/ DKA/ acute respiratory failure: --has not missed any hemodialysis sessions post-hospital discharge.  Continues to report tolerating hemodialysis sessions "fine;" confirms that she continues using established transportation services for hemodialysis/ provider appointments -- has appointment at her home today "with the dietician" although she can not tell me who the dietician works for; states she plans to discuss diet and I coached her around talking points to cover for renal and diabetic diet; she verbalizes agreement, stating she plans to do -- continues monitoring and recording morning fasting blood sugars at home once daily: reviewed recently recorded blood sugars; patient reports recent fasting blood sugars as: "126; 136; 111; 131; and 183" with value this morning: "136"  Patient denies further issues, concerns, or problems today. Iconfirmed that patient hasmy direct phone number, the main [facility] CM office phone number, and the [facility] CM 24-hour nurse advice phone number should issues arise prior to next scheduled [facility] CM outreach. Encouraged patient to contact me directly if needs, questions, issues, or concerns arise prior to next scheduled outreach; patient agreed to do so.  Plan:  Patient will take medications as prescribed and will attend all scheduled providerand hemodialysisappointments  Patient will promptly notify care providers for any new concerns/ issues/ problems that arise  Patient will actively participate in home health services as ordered post-hospital discharge  Patient will continue monitoring/ recordingfasting  morningdailyblood sugars  I will share today's notes/ care plan with patient's PCP as [facility]   CM initial assessment  [facility] CM outreach to continue with scheduled phone call in 2 weeks, sooner if indicated  [facility] CM Care Plan Problem One     Most Recent Value  Care Plan Problem One  High Risk for hospital readmission related to/ as evidenced by extended hospitalization with discharge home to self-care in elderly patient that lives alone and is new to hemodialysis  Role Documenting the Problem One  Care Management [place] for Problem One  Active  [facility] Long Term Goal   Over the next 14 days, patient will not experience hospital re-admission as evidenced by patient reporting and review of EHR during [facility] RN CM outreach [goal extended today]  [facility] Long Term Goal Start Date  03/15/20 Barrie Folk extended/ re-established today]  Interventions for Problem One Long Term Goal  Discussed with patient her current clinical condition and reviewed recent PCP office visit with her,  confirmed with patient that she has spoken to [facility] CSW and discussed her decision to not engage with personal care services,  encouraged patient to re-consider and explained personal care services through medicaid benefit  [facility] CM Short Term Goal #1   Over the next 30 days, patient will actively participate in home health services as evidenced by patient reporting and collaboration with home health team as indciated during [place] outreach  [facility] CM Short Term Goal #1 Start Date  02/23/20  [facility] CM Short Term Goal #1 Met Date  03/15/20 [Goal met]  Interventions for Short Term Goal #1  Discussed with patient recent home health visits and confirmed that patient has continued actively participating in home health services and expects services to be completed soon,  encouraged patient to continue particpating as long as services are active  [facility] CM Short Term Goal #2   Over the next 15 days, patient will attend all scheduled hemodialysis and  provider appointments, as evidenced by patient reporting and collaboration with care teams as indicated during [facility] RN CM outreach [Goal re-established/ extended today]  [facility] CM Short Term Goal #2 Start Date  03/15/20 [Goal re-established/ extended today]  Interventions for Short Term Goal #2  Reviewed with patient recent provider and hemodialysis appointments,  confirmed that patient has attended all as scheduled,  confirms that she continues using established transportation services to attend provider appointments,  discussed talking points for upcoming renal provider visit, which patient reports will be held next week at her hemodialysis center     [name], [place], BSN, [employer] Coordinator [facility] Care Management  [phone]

## 2020-03-16 DIAGNOSIS — N2581 Secondary hyperparathyroidism of renal origin: Secondary | ICD-10-CM | POA: Diagnosis not present

## 2020-03-16 DIAGNOSIS — N170 Acute kidney failure with tubular necrosis: Secondary | ICD-10-CM | POA: Diagnosis not present

## 2020-03-16 DIAGNOSIS — D509 Iron deficiency anemia, unspecified: Secondary | ICD-10-CM | POA: Diagnosis not present

## 2020-03-16 DIAGNOSIS — Z992 Dependence on renal dialysis: Secondary | ICD-10-CM | POA: Diagnosis not present

## 2020-03-16 DIAGNOSIS — D689 Coagulation defect, unspecified: Secondary | ICD-10-CM | POA: Diagnosis not present

## 2020-03-16 DIAGNOSIS — T8249XA Other complication of vascular dialysis catheter, initial encounter: Secondary | ICD-10-CM | POA: Diagnosis not present

## 2020-03-16 DIAGNOSIS — D688 Other specified coagulation defects: Secondary | ICD-10-CM | POA: Diagnosis not present

## 2020-03-18 ENCOUNTER — Other Ambulatory Visit: Payer: Self-pay | Admitting: Family Medicine

## 2020-03-18 DIAGNOSIS — Z992 Dependence on renal dialysis: Secondary | ICD-10-CM | POA: Diagnosis not present

## 2020-03-18 DIAGNOSIS — D688 Other specified coagulation defects: Secondary | ICD-10-CM | POA: Diagnosis not present

## 2020-03-18 DIAGNOSIS — T8249XA Other complication of vascular dialysis catheter, initial encounter: Secondary | ICD-10-CM | POA: Diagnosis not present

## 2020-03-18 DIAGNOSIS — N2581 Secondary hyperparathyroidism of renal origin: Secondary | ICD-10-CM | POA: Diagnosis not present

## 2020-03-18 DIAGNOSIS — D689 Coagulation defect, unspecified: Secondary | ICD-10-CM | POA: Diagnosis not present

## 2020-03-18 DIAGNOSIS — D509 Iron deficiency anemia, unspecified: Secondary | ICD-10-CM | POA: Diagnosis not present

## 2020-03-18 DIAGNOSIS — N170 Acute kidney failure with tubular necrosis: Secondary | ICD-10-CM | POA: Diagnosis not present

## 2020-03-21 DIAGNOSIS — D509 Iron deficiency anemia, unspecified: Secondary | ICD-10-CM | POA: Diagnosis not present

## 2020-03-21 DIAGNOSIS — J449 Chronic obstructive pulmonary disease, unspecified: Secondary | ICD-10-CM | POA: Diagnosis not present

## 2020-03-21 DIAGNOSIS — T8249XA Other complication of vascular dialysis catheter, initial encounter: Secondary | ICD-10-CM | POA: Diagnosis not present

## 2020-03-21 DIAGNOSIS — Z992 Dependence on renal dialysis: Secondary | ICD-10-CM | POA: Diagnosis not present

## 2020-03-21 DIAGNOSIS — M25472 Effusion, left ankle: Secondary | ICD-10-CM | POA: Diagnosis not present

## 2020-03-21 DIAGNOSIS — N2581 Secondary hyperparathyroidism of renal origin: Secondary | ICD-10-CM | POA: Diagnosis not present

## 2020-03-21 DIAGNOSIS — D688 Other specified coagulation defects: Secondary | ICD-10-CM | POA: Diagnosis not present

## 2020-03-21 DIAGNOSIS — N170 Acute kidney failure with tubular necrosis: Secondary | ICD-10-CM | POA: Diagnosis not present

## 2020-03-21 DIAGNOSIS — D689 Coagulation defect, unspecified: Secondary | ICD-10-CM | POA: Diagnosis not present

## 2020-03-21 DIAGNOSIS — M25572 Pain in left ankle and joints of left foot: Secondary | ICD-10-CM | POA: Diagnosis not present

## 2020-03-22 DIAGNOSIS — N170 Acute kidney failure with tubular necrosis: Secondary | ICD-10-CM | POA: Diagnosis not present

## 2020-03-22 DIAGNOSIS — J449 Chronic obstructive pulmonary disease, unspecified: Secondary | ICD-10-CM | POA: Diagnosis not present

## 2020-03-22 DIAGNOSIS — J9601 Acute respiratory failure with hypoxia: Secondary | ICD-10-CM | POA: Diagnosis not present

## 2020-03-22 DIAGNOSIS — E119 Type 2 diabetes mellitus without complications: Secondary | ICD-10-CM | POA: Diagnosis not present

## 2020-03-22 DIAGNOSIS — I10 Essential (primary) hypertension: Secondary | ICD-10-CM | POA: Diagnosis not present

## 2020-03-23 DIAGNOSIS — D688 Other specified coagulation defects: Secondary | ICD-10-CM | POA: Diagnosis not present

## 2020-03-23 DIAGNOSIS — T8249XA Other complication of vascular dialysis catheter, initial encounter: Secondary | ICD-10-CM | POA: Diagnosis not present

## 2020-03-23 DIAGNOSIS — D689 Coagulation defect, unspecified: Secondary | ICD-10-CM | POA: Diagnosis not present

## 2020-03-23 DIAGNOSIS — N170 Acute kidney failure with tubular necrosis: Secondary | ICD-10-CM | POA: Diagnosis not present

## 2020-03-23 DIAGNOSIS — N2581 Secondary hyperparathyroidism of renal origin: Secondary | ICD-10-CM | POA: Diagnosis not present

## 2020-03-23 DIAGNOSIS — D509 Iron deficiency anemia, unspecified: Secondary | ICD-10-CM | POA: Diagnosis not present

## 2020-03-23 DIAGNOSIS — Z992 Dependence on renal dialysis: Secondary | ICD-10-CM | POA: Diagnosis not present

## 2020-03-25 ENCOUNTER — Other Ambulatory Visit: Payer: Self-pay | Admitting: Family Medicine

## 2020-03-25 DIAGNOSIS — N170 Acute kidney failure with tubular necrosis: Secondary | ICD-10-CM | POA: Diagnosis not present

## 2020-03-25 DIAGNOSIS — D688 Other specified coagulation defects: Secondary | ICD-10-CM | POA: Diagnosis not present

## 2020-03-25 DIAGNOSIS — T8249XA Other complication of vascular dialysis catheter, initial encounter: Secondary | ICD-10-CM | POA: Diagnosis not present

## 2020-03-25 DIAGNOSIS — Z992 Dependence on renal dialysis: Secondary | ICD-10-CM | POA: Diagnosis not present

## 2020-03-25 DIAGNOSIS — N2581 Secondary hyperparathyroidism of renal origin: Secondary | ICD-10-CM | POA: Diagnosis not present

## 2020-03-25 DIAGNOSIS — D509 Iron deficiency anemia, unspecified: Secondary | ICD-10-CM | POA: Diagnosis not present

## 2020-03-25 DIAGNOSIS — D689 Coagulation defect, unspecified: Secondary | ICD-10-CM | POA: Diagnosis not present

## 2020-03-27 ENCOUNTER — Telehealth: Payer: Self-pay

## 2020-03-27 NOTE — Telephone Encounter (Signed)
Left message on VM asking pt to let us know if she has had a DM Eye Exam in the last 12 months. If so, where , so we can request records. If not, would she like a referral.

## 2020-03-28 DIAGNOSIS — D509 Iron deficiency anemia, unspecified: Secondary | ICD-10-CM | POA: Diagnosis not present

## 2020-03-28 DIAGNOSIS — D689 Coagulation defect, unspecified: Secondary | ICD-10-CM | POA: Diagnosis not present

## 2020-03-28 DIAGNOSIS — Z992 Dependence on renal dialysis: Secondary | ICD-10-CM | POA: Diagnosis not present

## 2020-03-28 DIAGNOSIS — N170 Acute kidney failure with tubular necrosis: Secondary | ICD-10-CM | POA: Diagnosis not present

## 2020-03-28 DIAGNOSIS — T8249XA Other complication of vascular dialysis catheter, initial encounter: Secondary | ICD-10-CM | POA: Diagnosis not present

## 2020-03-28 DIAGNOSIS — D688 Other specified coagulation defects: Secondary | ICD-10-CM | POA: Diagnosis not present

## 2020-03-28 DIAGNOSIS — N179 Acute kidney failure, unspecified: Secondary | ICD-10-CM | POA: Diagnosis not present

## 2020-03-28 DIAGNOSIS — N2581 Secondary hyperparathyroidism of renal origin: Secondary | ICD-10-CM | POA: Diagnosis not present

## 2020-03-29 ENCOUNTER — Encounter: Payer: Self-pay | Admitting: *Deleted

## 2020-03-29 ENCOUNTER — Other Ambulatory Visit: Payer: Self-pay | Admitting: *Deleted

## 2020-03-29 NOTE — Patient Outreach (Addendum)
[place] [facility]) Care Management THN CM Telephone Outreach Post-hospital discharge day # 39 without unplanned hospital re-admission [place] Coordination x 2   03/29/2020  Cindy Burns 1946-04-20 [id number]  Successful telephone outreach to Cindy Burns, 74 y/o female referred to [facility] CM byinpatient RN CMon Feb 17, 2020; patient was recently hospitalized April 3- Feb 19, 2020 foracute renal failure/ DKA/ acute respiratory failure and required initiation of hemodialysis while hospitalized.Patient has history including, but not limited to, breast cancer; DM; COPD; depression; HTN/ HLD; hepatomegaly; and tobacco use. Patient was discharged home to self-care with home health services through [facility], which are now completed.    HIPAA/ identity verified; patient reports "doing real good" and she sounds to be in no distress throughout phone call today. Reports she was told recently at hemodialysis by the doctor there that she will have to continue hemodialysis for the near future; however, she is still hopeful that this will not be permanent.  Patient further reports:  --no concerns around medications; however, when specifically questioned about her medications, she tells me she has still not obtained her post-hospital prescription for Januvia; states she has contacted her pharmacy, but has not heard anything back; can not remember when she last outreached her pharmacy to follow up on this issue.  [facility] CM Telephone Outreach Care Coordination 3:35: contacted patient's outpatient pharmacy via 3-way call with patient on line, and spoke with "Cindy Burns" and confirmed that the order from PCP in May 2021 was initially placed on hold; Cindy Burns reports that this prescription is now not on hold and can be filled; reports that she will fill prescription which should be ready for pick up within the hour; patient agrees to pick medication up and start taking this evening;  encouraged patient to discuss best time of day to take this medication around her established hemodialysis sessions and she agrees to do so   -- has not yet obtained post-hospital discharge appointment with neurosurgery for her (L) foot drop; states she has contacted office several times, but has never been able to speak to a live person to schedule  [facility] CM Telephone Outreach Care Coordination 3:20 pm: contacted neurosurgery office via 3-way call with patient on line and was told that the office manager/ secretary Cindy Burns has to schedule all patient appointments; left detailed voice message for [facility] and requested that she contact patient to schedule post-hospital discharge office visit; after leaving message, had patient read the contact information for neurosurgery provider/ direct extension back to me; encouraged patient to call neurosurgery office back to attempt to schedule if she does not hear back from them promptly; reminded patient that I am available to assist if needed  Self-health management ofrenal failure requiring new hemodialysis/ DKA/ acute respiratory failure: --hasnot missed anyhemodialysis sessions post-hospital discharge. Continues to report tolerating hemodialysis sessions "fine;" confirms that she continues using established transportation services for hemodialysis/ provider appointments -- attended appointment at her home in early June "with the dietician" and with much questioning, it appears this was a part of the home health services; confirms that she regularly sees dietician at hemodialysis center; verbalizes a good overall/ general understanding of renal diet and verbalizes adherence to same --continuesmonitoring and recording morning fasting blood sugars at home once daily:reviewed recently recorded blood sugars; patientreportsrecent fasting blood sugars as: "120-180" with value this morning: "129;" discussed possibility that blood sugars may decrease once she obtains  [place]- encouraged patient to continue monitoring blood sugars as recommended and to  promptly start taking diabetes medication that now that it has been processed properly through outpatient pharmacy and she verbalizes understanding and agreement.  Patient denies further issues, concerns, or problems today. [place]-provided/ confirmed that patient hasmy direct phone number, the main [facility] CM office phone number, and the [facility] CM 24-hour nurse advice phone number should issues arise prior to next scheduled THN CM outreach. Encouraged patient to contact me directly if needs, questions, issues, or concerns arise prior to next scheduled outreach; patient agreed to do so.  Plan:  Patient will take medications as prescribed and will attend all scheduled providerand hemodialysisappointments  Patient will promptly notify care providers for any new concerns/ issues/ problems that arise  Patient will continue monitoring/ recordingfasting morningdailyblood sugars  I will make patient's PCP aware of today's care coordination efforts  I will place printed educational material in mail to patient around self-health management of diabetes and ESRD  THN CM outreach to continue with scheduled phone callnext month, sooner if indicated  [name] CM Care Plan Problem One     Most Recent Value  Care Plan Problem One High Risk for hospital readmission related to/ as evidenced by extended hospitalization with discharge home to self-care in elderly patient that lives alone and is new to hemodialysis  Role Documenting the Problem One Care Management Coordinator  Care Plan for Problem One Not Active  THN Long Term Goal  Over the next 14 days, patient will not experience hospital re-admission as evidenced by patient reporting and review of EHR during [facility] RN CM outreach  [facility] Long Term Goal Start Date 03/15/20  [facility] Long Term Goal Met Date 03/29/20  [facility] met]  Interventions for Problem One Long Term Goal Discussed  current clinical condition with patient and confirmed that she has no current clinical concerns,  reviewed with patient recent hemodialysis sessions and blood sugars at home with patient  [facility] CM Short Term Goal #2  Over the next 15 days, patient will attend all scheduled hemodialysis and provider appointments, as evidenced by patient reporting and collaboration with care teams as indicated during [facility] RN CM outreach  [facility] CM Short Term Goal #2 Start Date 03/15/20  [facility] CM Short Term Goal #2 Met Date 03/29/20  [Goal Met]  Interventions for Short Term Goal #2 Confirmed that patient has attended all recent hemodialysis sessions and provider appointments and confirms that she understands importance of adherence to attending all hemodialysis sessions    [facility] CM Care Plan Problem Two     Most Recent Value  Care Plan Problem Two Ongoing self-health management of multiple chronic conditions including DM and renal failure, as evidenced by patient reporting   Role Documenting the Problem Two Care Management Coordinator  Care Plan for Problem Two Active  Interventions for Problem Two Long Term Goal  reviewed recent blood sugars at home with patient,  confirmed that patient has not obtained diabetes medication,  placed care coordination outreach to patient's outpatient pharmacy and facilitated patient obtaining medication as ordered by PCP,  encouraged patient to promptly obtain the medication,  placed printed educational material in mail to patient around self-health management of chronic disease state of DM  THN Long Term Goal Over the next 60 days, patient will monitor and record daily blood sugars at home as evidenced by review of same with patient during [facility] RN CM outreach  [facility] Long Term Goal Start Date 03/29/20  [facility] CM Short Term Goal #1  Over the next 30 days, patient will take medications  as prescribed, as evidenced by patient reporting/ review of same during [facility] RN CM outreach  [facility] CM Short Term Goal #1  Start Date 03/29/20  Interventions for Short Term Goal #2  Confirmed that patient has not been taking diabetes medication as recently prescribed by her PCP,  faciltated through care coordination patient obtaining medication,  reviewed use of medication with patient and informed patient's PCP around today's outreach with patient  [facility]N CM Short Term Goal #2  Over the next 30 days, patient will continue to attend hemodialysis sessions three times per week, as evidenced by patient reporting and collaboration with hemodialysis team as indicated during [facility] RN CM outreach  [facility] CM Short Term Goal #2 Start Date 03/29/20  Interventions for Short Term Goal #2 Discussed patient's toleration of hemodialysis and need for adherence to attending all sessions as scheduled,  placed care coordination outreach to facilitate patient obtaining neurosurgery appointment around her exisiting hemodialysis schedule,  placed printed educational material in mail to patient around self-health management of chronic disease state of ESRD     ----- Message ----- From: [name], RN Sent: 03/29/2020   5:15 PM EDT To: [name], MD Subject: Juluis Rainier- patient has not been taking Januvia; ph*  Hi Dr. Diona Browner,  I determined after speaking with patient this afternoon that she never followed up on the [place] prescription you refilled for her during a May office visit; apparently, the OP pharmacy had it on hold for some reason, and then forgot about it, as did the patient.  I completed a care coordination outreach and facilitated getting the prescription filled.  Her fasting blood sugars have been ranging between 120-180, per review of same with patient today.  I encouraged the patient to pick the medication up this evening as the pharmacy team assured [place] that it would be ready.  Also-- she had never followed up on the recommended neurosurgery office visit post-hospital discharge.... said she tried to call the office several times  and could not get anyone on the line.  I left a message with the office staff at the neurosurgeon's office and requested they contact patient promptly to schedule this visit.  Just wanted you to know all of above.  Let me know if you have any questions.  Thanks, [name], RN, BSN, [place] Care Management  [phone]   -------------------------------------------------------------------  [name], RN, BSN, [place] Coordinator [facility] Care Management  [phone]

## 2020-03-30 DIAGNOSIS — D509 Iron deficiency anemia, unspecified: Secondary | ICD-10-CM | POA: Diagnosis not present

## 2020-03-30 DIAGNOSIS — N2581 Secondary hyperparathyroidism of renal origin: Secondary | ICD-10-CM | POA: Diagnosis not present

## 2020-03-30 DIAGNOSIS — D689 Coagulation defect, unspecified: Secondary | ICD-10-CM | POA: Diagnosis not present

## 2020-03-30 DIAGNOSIS — N170 Acute kidney failure with tubular necrosis: Secondary | ICD-10-CM | POA: Diagnosis not present

## 2020-03-30 DIAGNOSIS — Z992 Dependence on renal dialysis: Secondary | ICD-10-CM | POA: Diagnosis not present

## 2020-03-30 DIAGNOSIS — D688 Other specified coagulation defects: Secondary | ICD-10-CM | POA: Diagnosis not present

## 2020-03-30 DIAGNOSIS — T8249XA Other complication of vascular dialysis catheter, initial encounter: Secondary | ICD-10-CM | POA: Diagnosis not present

## 2020-04-01 DIAGNOSIS — T8249XA Other complication of vascular dialysis catheter, initial encounter: Secondary | ICD-10-CM | POA: Diagnosis not present

## 2020-04-01 DIAGNOSIS — N2581 Secondary hyperparathyroidism of renal origin: Secondary | ICD-10-CM | POA: Diagnosis not present

## 2020-04-01 DIAGNOSIS — Z992 Dependence on renal dialysis: Secondary | ICD-10-CM | POA: Diagnosis not present

## 2020-04-01 DIAGNOSIS — D688 Other specified coagulation defects: Secondary | ICD-10-CM | POA: Diagnosis not present

## 2020-04-01 DIAGNOSIS — D689 Coagulation defect, unspecified: Secondary | ICD-10-CM | POA: Diagnosis not present

## 2020-04-01 DIAGNOSIS — D509 Iron deficiency anemia, unspecified: Secondary | ICD-10-CM | POA: Diagnosis not present

## 2020-04-01 DIAGNOSIS — N170 Acute kidney failure with tubular necrosis: Secondary | ICD-10-CM | POA: Diagnosis not present

## 2020-04-04 DIAGNOSIS — T8249XA Other complication of vascular dialysis catheter, initial encounter: Secondary | ICD-10-CM | POA: Diagnosis not present

## 2020-04-04 DIAGNOSIS — D509 Iron deficiency anemia, unspecified: Secondary | ICD-10-CM | POA: Diagnosis not present

## 2020-04-04 DIAGNOSIS — N179 Acute kidney failure, unspecified: Secondary | ICD-10-CM | POA: Diagnosis not present

## 2020-04-04 DIAGNOSIS — Z992 Dependence on renal dialysis: Secondary | ICD-10-CM | POA: Diagnosis not present

## 2020-04-04 DIAGNOSIS — N170 Acute kidney failure with tubular necrosis: Secondary | ICD-10-CM | POA: Diagnosis not present

## 2020-04-04 DIAGNOSIS — N2581 Secondary hyperparathyroidism of renal origin: Secondary | ICD-10-CM | POA: Diagnosis not present

## 2020-04-04 DIAGNOSIS — D689 Coagulation defect, unspecified: Secondary | ICD-10-CM | POA: Diagnosis not present

## 2020-04-04 DIAGNOSIS — D688 Other specified coagulation defects: Secondary | ICD-10-CM | POA: Diagnosis not present

## 2020-04-05 ENCOUNTER — Other Ambulatory Visit: Payer: Self-pay | Admitting: Family Medicine

## 2020-04-06 DIAGNOSIS — D689 Coagulation defect, unspecified: Secondary | ICD-10-CM | POA: Diagnosis not present

## 2020-04-06 DIAGNOSIS — N2581 Secondary hyperparathyroidism of renal origin: Secondary | ICD-10-CM | POA: Diagnosis not present

## 2020-04-06 DIAGNOSIS — N170 Acute kidney failure with tubular necrosis: Secondary | ICD-10-CM | POA: Diagnosis not present

## 2020-04-06 DIAGNOSIS — Z992 Dependence on renal dialysis: Secondary | ICD-10-CM | POA: Diagnosis not present

## 2020-04-06 DIAGNOSIS — D649 Anemia, unspecified: Secondary | ICD-10-CM | POA: Diagnosis not present

## 2020-04-06 DIAGNOSIS — D688 Other specified coagulation defects: Secondary | ICD-10-CM | POA: Diagnosis not present

## 2020-04-06 DIAGNOSIS — D509 Iron deficiency anemia, unspecified: Secondary | ICD-10-CM | POA: Diagnosis not present

## 2020-04-06 DIAGNOSIS — T8249XA Other complication of vascular dialysis catheter, initial encounter: Secondary | ICD-10-CM | POA: Diagnosis not present

## 2020-04-06 DIAGNOSIS — R52 Pain, unspecified: Secondary | ICD-10-CM | POA: Diagnosis not present

## 2020-04-08 DIAGNOSIS — D689 Coagulation defect, unspecified: Secondary | ICD-10-CM | POA: Diagnosis not present

## 2020-04-08 DIAGNOSIS — N2581 Secondary hyperparathyroidism of renal origin: Secondary | ICD-10-CM | POA: Diagnosis not present

## 2020-04-08 DIAGNOSIS — D688 Other specified coagulation defects: Secondary | ICD-10-CM | POA: Diagnosis not present

## 2020-04-08 DIAGNOSIS — N170 Acute kidney failure with tubular necrosis: Secondary | ICD-10-CM | POA: Diagnosis not present

## 2020-04-08 DIAGNOSIS — D509 Iron deficiency anemia, unspecified: Secondary | ICD-10-CM | POA: Diagnosis not present

## 2020-04-08 DIAGNOSIS — T8249XA Other complication of vascular dialysis catheter, initial encounter: Secondary | ICD-10-CM | POA: Diagnosis not present

## 2020-04-08 DIAGNOSIS — Z992 Dependence on renal dialysis: Secondary | ICD-10-CM | POA: Diagnosis not present

## 2020-04-08 DIAGNOSIS — R52 Pain, unspecified: Secondary | ICD-10-CM | POA: Diagnosis not present

## 2020-04-08 DIAGNOSIS — D649 Anemia, unspecified: Secondary | ICD-10-CM | POA: Diagnosis not present

## 2020-04-12 DIAGNOSIS — N179 Acute kidney failure, unspecified: Secondary | ICD-10-CM | POA: Diagnosis not present

## 2020-04-13 DIAGNOSIS — Z992 Dependence on renal dialysis: Secondary | ICD-10-CM | POA: Diagnosis not present

## 2020-04-13 DIAGNOSIS — T8249XA Other complication of vascular dialysis catheter, initial encounter: Secondary | ICD-10-CM | POA: Diagnosis not present

## 2020-04-13 DIAGNOSIS — D689 Coagulation defect, unspecified: Secondary | ICD-10-CM | POA: Diagnosis not present

## 2020-04-13 DIAGNOSIS — D649 Anemia, unspecified: Secondary | ICD-10-CM | POA: Diagnosis not present

## 2020-04-13 DIAGNOSIS — N170 Acute kidney failure with tubular necrosis: Secondary | ICD-10-CM | POA: Diagnosis not present

## 2020-04-13 DIAGNOSIS — D688 Other specified coagulation defects: Secondary | ICD-10-CM | POA: Diagnosis not present

## 2020-04-13 DIAGNOSIS — D509 Iron deficiency anemia, unspecified: Secondary | ICD-10-CM | POA: Diagnosis not present

## 2020-04-13 DIAGNOSIS — R52 Pain, unspecified: Secondary | ICD-10-CM | POA: Diagnosis not present

## 2020-04-13 DIAGNOSIS — N2581 Secondary hyperparathyroidism of renal origin: Secondary | ICD-10-CM | POA: Diagnosis not present

## 2020-04-15 DIAGNOSIS — R52 Pain, unspecified: Secondary | ICD-10-CM | POA: Diagnosis not present

## 2020-04-15 DIAGNOSIS — D509 Iron deficiency anemia, unspecified: Secondary | ICD-10-CM | POA: Diagnosis not present

## 2020-04-15 DIAGNOSIS — T8249XA Other complication of vascular dialysis catheter, initial encounter: Secondary | ICD-10-CM | POA: Diagnosis not present

## 2020-04-15 DIAGNOSIS — N170 Acute kidney failure with tubular necrosis: Secondary | ICD-10-CM | POA: Diagnosis not present

## 2020-04-15 DIAGNOSIS — Z992 Dependence on renal dialysis: Secondary | ICD-10-CM | POA: Diagnosis not present

## 2020-04-15 DIAGNOSIS — N2581 Secondary hyperparathyroidism of renal origin: Secondary | ICD-10-CM | POA: Diagnosis not present

## 2020-04-15 DIAGNOSIS — D649 Anemia, unspecified: Secondary | ICD-10-CM | POA: Diagnosis not present

## 2020-04-15 DIAGNOSIS — D689 Coagulation defect, unspecified: Secondary | ICD-10-CM | POA: Diagnosis not present

## 2020-04-15 DIAGNOSIS — D688 Other specified coagulation defects: Secondary | ICD-10-CM | POA: Diagnosis not present

## 2020-04-18 DIAGNOSIS — N179 Acute kidney failure, unspecified: Secondary | ICD-10-CM | POA: Diagnosis not present

## 2020-04-18 DIAGNOSIS — D509 Iron deficiency anemia, unspecified: Secondary | ICD-10-CM | POA: Diagnosis not present

## 2020-04-18 DIAGNOSIS — N170 Acute kidney failure with tubular necrosis: Secondary | ICD-10-CM | POA: Diagnosis not present

## 2020-04-18 DIAGNOSIS — D649 Anemia, unspecified: Secondary | ICD-10-CM | POA: Diagnosis not present

## 2020-04-18 DIAGNOSIS — T8249XA Other complication of vascular dialysis catheter, initial encounter: Secondary | ICD-10-CM | POA: Diagnosis not present

## 2020-04-18 DIAGNOSIS — D688 Other specified coagulation defects: Secondary | ICD-10-CM | POA: Diagnosis not present

## 2020-04-18 DIAGNOSIS — Z992 Dependence on renal dialysis: Secondary | ICD-10-CM | POA: Diagnosis not present

## 2020-04-18 DIAGNOSIS — R52 Pain, unspecified: Secondary | ICD-10-CM | POA: Diagnosis not present

## 2020-04-18 DIAGNOSIS — D689 Coagulation defect, unspecified: Secondary | ICD-10-CM | POA: Diagnosis not present

## 2020-04-18 DIAGNOSIS — N2581 Secondary hyperparathyroidism of renal origin: Secondary | ICD-10-CM | POA: Diagnosis not present

## 2020-04-20 DIAGNOSIS — D689 Coagulation defect, unspecified: Secondary | ICD-10-CM | POA: Diagnosis not present

## 2020-04-20 DIAGNOSIS — M25572 Pain in left ankle and joints of left foot: Secondary | ICD-10-CM | POA: Diagnosis not present

## 2020-04-20 DIAGNOSIS — R52 Pain, unspecified: Secondary | ICD-10-CM | POA: Diagnosis not present

## 2020-04-20 DIAGNOSIS — D509 Iron deficiency anemia, unspecified: Secondary | ICD-10-CM | POA: Diagnosis not present

## 2020-04-20 DIAGNOSIS — D688 Other specified coagulation defects: Secondary | ICD-10-CM | POA: Diagnosis not present

## 2020-04-20 DIAGNOSIS — N170 Acute kidney failure with tubular necrosis: Secondary | ICD-10-CM | POA: Diagnosis not present

## 2020-04-20 DIAGNOSIS — T8249XA Other complication of vascular dialysis catheter, initial encounter: Secondary | ICD-10-CM | POA: Diagnosis not present

## 2020-04-20 DIAGNOSIS — M25472 Effusion, left ankle: Secondary | ICD-10-CM | POA: Diagnosis not present

## 2020-04-20 DIAGNOSIS — D649 Anemia, unspecified: Secondary | ICD-10-CM | POA: Diagnosis not present

## 2020-04-20 DIAGNOSIS — N2581 Secondary hyperparathyroidism of renal origin: Secondary | ICD-10-CM | POA: Diagnosis not present

## 2020-04-20 DIAGNOSIS — J449 Chronic obstructive pulmonary disease, unspecified: Secondary | ICD-10-CM | POA: Diagnosis not present

## 2020-04-20 DIAGNOSIS — Z992 Dependence on renal dialysis: Secondary | ICD-10-CM | POA: Diagnosis not present

## 2020-04-20 NOTE — Assessment & Plan Note (Signed)
Resolved

## 2020-04-20 NOTE — Assessment & Plan Note (Signed)
Encouraged continued smoking cessation 

## 2020-04-20 NOTE — Assessment & Plan Note (Signed)
Retart venlafaxine.

## 2020-04-20 NOTE — Assessment & Plan Note (Addendum)
Stable control on previous regimen. Restart Januvia at 25 mg daily ( low dose given dialysis)

## 2020-04-20 NOTE — Assessment & Plan Note (Signed)
Refer to CCM for med assesment on dialyis

## 2020-04-20 NOTE — Assessment & Plan Note (Signed)
Well controlled. Continue current medication.  

## 2020-04-22 ENCOUNTER — Other Ambulatory Visit: Payer: Self-pay | Admitting: Family Medicine

## 2020-04-22 DIAGNOSIS — D689 Coagulation defect, unspecified: Secondary | ICD-10-CM | POA: Diagnosis not present

## 2020-04-22 DIAGNOSIS — T8249XA Other complication of vascular dialysis catheter, initial encounter: Secondary | ICD-10-CM | POA: Diagnosis not present

## 2020-04-22 DIAGNOSIS — N2581 Secondary hyperparathyroidism of renal origin: Secondary | ICD-10-CM | POA: Diagnosis not present

## 2020-04-22 DIAGNOSIS — D649 Anemia, unspecified: Secondary | ICD-10-CM | POA: Diagnosis not present

## 2020-04-22 DIAGNOSIS — N170 Acute kidney failure with tubular necrosis: Secondary | ICD-10-CM | POA: Diagnosis not present

## 2020-04-22 DIAGNOSIS — Z992 Dependence on renal dialysis: Secondary | ICD-10-CM | POA: Diagnosis not present

## 2020-04-22 DIAGNOSIS — D688 Other specified coagulation defects: Secondary | ICD-10-CM | POA: Diagnosis not present

## 2020-04-22 DIAGNOSIS — D509 Iron deficiency anemia, unspecified: Secondary | ICD-10-CM | POA: Diagnosis not present

## 2020-04-22 DIAGNOSIS — R52 Pain, unspecified: Secondary | ICD-10-CM | POA: Diagnosis not present

## 2020-04-25 DIAGNOSIS — D688 Other specified coagulation defects: Secondary | ICD-10-CM | POA: Diagnosis not present

## 2020-04-25 DIAGNOSIS — R52 Pain, unspecified: Secondary | ICD-10-CM | POA: Diagnosis not present

## 2020-04-25 DIAGNOSIS — N2581 Secondary hyperparathyroidism of renal origin: Secondary | ICD-10-CM | POA: Diagnosis not present

## 2020-04-25 DIAGNOSIS — N179 Acute kidney failure, unspecified: Secondary | ICD-10-CM | POA: Diagnosis not present

## 2020-04-25 DIAGNOSIS — D509 Iron deficiency anemia, unspecified: Secondary | ICD-10-CM | POA: Diagnosis not present

## 2020-04-25 DIAGNOSIS — Z992 Dependence on renal dialysis: Secondary | ICD-10-CM | POA: Diagnosis not present

## 2020-04-25 DIAGNOSIS — D649 Anemia, unspecified: Secondary | ICD-10-CM | POA: Diagnosis not present

## 2020-04-25 DIAGNOSIS — N170 Acute kidney failure with tubular necrosis: Secondary | ICD-10-CM | POA: Diagnosis not present

## 2020-04-25 DIAGNOSIS — T8249XA Other complication of vascular dialysis catheter, initial encounter: Secondary | ICD-10-CM | POA: Diagnosis not present

## 2020-04-25 DIAGNOSIS — D689 Coagulation defect, unspecified: Secondary | ICD-10-CM | POA: Diagnosis not present

## 2020-04-27 DIAGNOSIS — T8249XA Other complication of vascular dialysis catheter, initial encounter: Secondary | ICD-10-CM | POA: Diagnosis not present

## 2020-04-27 DIAGNOSIS — D649 Anemia, unspecified: Secondary | ICD-10-CM | POA: Diagnosis not present

## 2020-04-27 DIAGNOSIS — D688 Other specified coagulation defects: Secondary | ICD-10-CM | POA: Diagnosis not present

## 2020-04-27 DIAGNOSIS — R52 Pain, unspecified: Secondary | ICD-10-CM | POA: Diagnosis not present

## 2020-04-27 DIAGNOSIS — N170 Acute kidney failure with tubular necrosis: Secondary | ICD-10-CM | POA: Diagnosis not present

## 2020-04-27 DIAGNOSIS — D689 Coagulation defect, unspecified: Secondary | ICD-10-CM | POA: Diagnosis not present

## 2020-04-27 DIAGNOSIS — D509 Iron deficiency anemia, unspecified: Secondary | ICD-10-CM | POA: Diagnosis not present

## 2020-04-27 DIAGNOSIS — N2581 Secondary hyperparathyroidism of renal origin: Secondary | ICD-10-CM | POA: Diagnosis not present

## 2020-04-27 DIAGNOSIS — Z992 Dependence on renal dialysis: Secondary | ICD-10-CM | POA: Diagnosis not present

## 2020-04-29 ENCOUNTER — Emergency Department (HOSPITAL_COMMUNITY): Payer: Medicare Other

## 2020-04-29 ENCOUNTER — Other Ambulatory Visit: Payer: Self-pay

## 2020-04-29 ENCOUNTER — Encounter (HOSPITAL_COMMUNITY): Payer: Self-pay | Admitting: Emergency Medicine

## 2020-04-29 ENCOUNTER — Emergency Department (HOSPITAL_COMMUNITY)
Admission: EM | Admit: 2020-04-29 | Discharge: 2020-04-30 | Disposition: A | Discharge status: Home / Self Care | Payer: Medicare Other | Attending: Emergency Medicine | Admitting: Emergency Medicine

## 2020-04-29 DIAGNOSIS — R112 Nausea with vomiting, unspecified: Secondary | ICD-10-CM | POA: Insufficient documentation

## 2020-04-29 DIAGNOSIS — R531 Weakness: Secondary | ICD-10-CM | POA: Insufficient documentation

## 2020-04-29 DIAGNOSIS — Z992 Dependence on renal dialysis: Secondary | ICD-10-CM | POA: Diagnosis not present

## 2020-04-29 DIAGNOSIS — D688 Other specified coagulation defects: Secondary | ICD-10-CM | POA: Diagnosis not present

## 2020-04-29 DIAGNOSIS — R000 Tachycardia, unspecified: Secondary | ICD-10-CM | POA: Diagnosis not present

## 2020-04-29 DIAGNOSIS — N186 End stage renal disease: Secondary | ICD-10-CM | POA: Diagnosis not present

## 2020-04-29 DIAGNOSIS — Z79899 Other long term (current) drug therapy: Secondary | ICD-10-CM | POA: Insufficient documentation

## 2020-04-29 DIAGNOSIS — Z743 Need for continuous supervision: Secondary | ICD-10-CM | POA: Diagnosis not present

## 2020-04-29 DIAGNOSIS — Z87891 Personal history of nicotine dependence: Secondary | ICD-10-CM | POA: Diagnosis not present

## 2020-04-29 DIAGNOSIS — R509 Fever, unspecified: Secondary | ICD-10-CM

## 2020-04-29 DIAGNOSIS — R05 Cough: Secondary | ICD-10-CM | POA: Insufficient documentation

## 2020-04-29 DIAGNOSIS — E1122 Type 2 diabetes mellitus with diabetic chronic kidney disease: Secondary | ICD-10-CM | POA: Insufficient documentation

## 2020-04-29 DIAGNOSIS — I120 Hypertensive chronic kidney disease with stage 5 chronic kidney disease or end stage renal disease: Secondary | ICD-10-CM | POA: Diagnosis not present

## 2020-04-29 DIAGNOSIS — N170 Acute kidney failure with tubular necrosis: Secondary | ICD-10-CM | POA: Diagnosis not present

## 2020-04-29 DIAGNOSIS — R52 Pain, unspecified: Secondary | ICD-10-CM | POA: Diagnosis not present

## 2020-04-29 DIAGNOSIS — Z20822 Contact with and (suspected) exposure to covid-19: Secondary | ICD-10-CM | POA: Diagnosis not present

## 2020-04-29 DIAGNOSIS — I499 Cardiac arrhythmia, unspecified: Secondary | ICD-10-CM | POA: Diagnosis not present

## 2020-04-29 DIAGNOSIS — D509 Iron deficiency anemia, unspecified: Secondary | ICD-10-CM | POA: Diagnosis not present

## 2020-04-29 DIAGNOSIS — N2581 Secondary hyperparathyroidism of renal origin: Secondary | ICD-10-CM | POA: Diagnosis not present

## 2020-04-29 DIAGNOSIS — D689 Coagulation defect, unspecified: Secondary | ICD-10-CM | POA: Diagnosis not present

## 2020-04-29 DIAGNOSIS — R0689 Other abnormalities of breathing: Secondary | ICD-10-CM | POA: Diagnosis not present

## 2020-04-29 DIAGNOSIS — T8249XA Other complication of vascular dialysis catheter, initial encounter: Secondary | ICD-10-CM | POA: Diagnosis not present

## 2020-04-29 DIAGNOSIS — R6889 Other general symptoms and signs: Secondary | ICD-10-CM | POA: Diagnosis not present

## 2020-04-29 DIAGNOSIS — D649 Anemia, unspecified: Secondary | ICD-10-CM | POA: Diagnosis not present

## 2020-04-29 LAB — POC OCCULT BLOOD, ED: Fecal Occult Bld: NEGATIVE

## 2020-04-29 LAB — CBC WITH DIFFERENTIAL/PLATELET
Abs Immature Granulocytes: 0.06 10*3/uL (ref 0.00–0.07)
Basophils Absolute: 0 10*3/uL (ref 0.0–0.1)
Basophils Relative: 0 %
Eosinophils Absolute: 0 10*3/uL (ref 0.0–0.5)
Eosinophils Relative: 0 %
HCT: 24.3 % — ABNORMAL LOW (ref 36.0–46.0)
Hemoglobin: 7.5 g/dL — ABNORMAL LOW (ref 12.0–15.0)
Immature Granulocytes: 1 %
Lymphocytes Relative: 3 %
Lymphs Abs: 0.2 10*3/uL — ABNORMAL LOW (ref 0.7–4.0)
MCH: 31.5 pg (ref 26.0–34.0)
MCHC: 30.9 g/dL (ref 30.0–36.0)
MCV: 102.1 fL — ABNORMAL HIGH (ref 80.0–100.0)
Monocytes Absolute: 0.3 10*3/uL (ref 0.1–1.0)
Monocytes Relative: 3 %
Neutro Abs: 8.4 10*3/uL — ABNORMAL HIGH (ref 1.7–7.7)
Neutrophils Relative %: 93 %
Platelets: 141 10*3/uL — ABNORMAL LOW (ref 150–400)
RBC: 2.38 MIL/uL — ABNORMAL LOW (ref 3.87–5.11)
RDW: 14.2 % (ref 11.5–15.5)
WBC: 8.9 10*3/uL (ref 4.0–10.5)
nRBC: 0 % (ref 0.0–0.2)

## 2020-04-29 LAB — COMPREHENSIVE METABOLIC PANEL
ALT: 119 U/L — ABNORMAL HIGH (ref 0–44)
AST: 209 U/L — ABNORMAL HIGH (ref 15–41)
Albumin: 2.4 g/dL — ABNORMAL LOW (ref 3.5–5.0)
Alkaline Phosphatase: 68 U/L (ref 38–126)
Anion gap: 11 (ref 5–15)
BUN: 11 mg/dL (ref 8–23)
CO2: 30 mmol/L (ref 22–32)
Calcium: 7.3 mg/dL — ABNORMAL LOW (ref 8.9–10.3)
Chloride: 92 mmol/L — ABNORMAL LOW (ref 98–111)
Creatinine, Ser: 2.27 mg/dL — ABNORMAL HIGH (ref 0.44–1.00)
GFR calc Af Amer: 24 mL/min — ABNORMAL LOW (ref >60)
GFR calc non Af Amer: 21 mL/min — ABNORMAL LOW (ref >60)
Glucose, Bld: 160 mg/dL — ABNORMAL HIGH (ref 70–99)
Potassium: 3.8 mmol/L (ref 3.5–5.1)
Sodium: 133 mmol/L — ABNORMAL LOW (ref 135–145)
Total Bilirubin: 0.6 mg/dL (ref 0.3–1.2)
Total Protein: 5.4 g/dL — ABNORMAL LOW (ref 6.5–8.1)

## 2020-04-29 LAB — LACTIC ACID, PLASMA
Lactic Acid, Venous: 5.3 mmol/L (ref 0.5–1.9)
Lactic Acid, Venous: 1.7 mmol/L (ref 0.5–1.9)

## 2020-04-29 LAB — APTT: aPTT: 49 seconds — ABNORMAL HIGH (ref 24–36)

## 2020-04-29 LAB — PROTIME-INR
INR: 1.5 — ABNORMAL HIGH (ref 0.8–1.2)
Prothrombin Time: 17.5 seconds — ABNORMAL HIGH (ref 11.4–15.2)

## 2020-04-29 LAB — SARS CORONAVIRUS 2 BY RT PCR (HOSPITAL ORDER, PERFORMED IN ~~LOC~~ HOSPITAL LAB): SARS Coronavirus 2: NEGATIVE

## 2020-04-29 MED ORDER — LACTATED RINGERS IV BOLUS (SEPSIS)
500.0000 mL | Freq: Once | INTRAVENOUS | Status: AC
  Administered 2020-04-30: 500 mL via INTRAVENOUS

## 2020-04-29 MED ORDER — VANCOMYCIN HCL IN DEXTROSE 1-5 GM/200ML-% IV SOLN
1000.0000 mg | Freq: Once | INTRAVENOUS | Status: AC
  Administered 2020-04-30: 1000 mg via INTRAVENOUS
  Filled 2020-04-29: qty 200

## 2020-04-29 MED ORDER — ACETAMINOPHEN 325 MG PO TABS
650.0000 mg | ORAL_TABLET | Freq: Once | ORAL | Status: AC
  Administered 2020-04-30: 650 mg via ORAL
  Filled 2020-04-29: qty 2

## 2020-04-29 MED ORDER — SODIUM CHLORIDE 0.9 % IV SOLN
1.0000 g | INTRAVENOUS | Status: DC
  Filled 2020-04-29: qty 1

## 2020-04-29 MED ORDER — LACTATED RINGERS IV BOLUS
1000.0000 mL | Freq: Once | INTRAVENOUS | Status: AC
  Administered 2020-04-29: 1000 mL via INTRAVENOUS

## 2020-04-29 MED ORDER — SODIUM CHLORIDE 0.9 % IV SOLN
2.0000 g | Freq: Once | INTRAVENOUS | Status: AC
  Administered 2020-04-29: 2 g via INTRAVENOUS
  Filled 2020-04-29: qty 2

## 2020-04-29 MED ORDER — VANCOMYCIN HCL 500 MG/100ML IV SOLN: 500.0000 mg | INTRAVENOUS | Status: DC

## 2020-04-29 NOTE — ED Triage Notes (Signed)
Pt from dialysis with a fever and weakness after she received full treatment. Pt was really shaky upon standing in dialysis. Pt denies pain at this time. Pt states she feels ok.

## 2020-04-29 NOTE — Progress Notes (Signed)
Pharmacy Antibiotic Note  Cindy Burns is a 74 y.o. female admitted on 04/29/2020 with sepsis.  Pharmacy has been consulted for vancomycin and cefepime dosing.  ESRD-HD TTS, estimated dry wt 49kg   Plan: Vancomycin 1000 mg IV x 1, then 500 mg IV q HD Cefepime 2g IV x 1, then 1g IV every 24 hours Monitor HD schedule, Cx and clinical progression to narrow Vancomycin random level as needed     Temp (24hrs), Avg:100.4 F (38 C), Min:100.4 F (38 C), Max:100.4 F (38 C)  Recent Labs  Lab 04/29/20 1800  WBC 8.9  CREATININE 2.27*  LATICACIDVEN 5.3*    CrCl cannot be calculated (Unknown ideal weight.).    No Known Allergies  Antimicrobials this admission: Vanc 7/24>> Cefepime 7/24>>  Dose adjustments this admission: n/a  Microbiology results: 7/24 BCx: sent 7/24 UCx: sent   Bertis Ruddy, PharmD Clinical Pharmacist ED Pharmacist Phone # [phone] 04/29/2020 9:22 PM

## 2020-04-29 NOTE — ED Notes (Signed)
2nd IV access attempted x2, unsuccessful. Pt. Tolerated well. Nurse will consult with another nurse then IV team if needed.

## 2020-04-29 NOTE — Consult Note (Signed)
Cindy Burns Consultation  CHANNIE BOSTICK YFV:[id number] DOB: 1945/11/06 DOA: 04/29/2020 PCP: [name], MD   Requesting EDP: Arlean Hopping Date of consultation: 04/29/2020 Reason for consultation: Fever of unknown origin; elevated lactate   Impression/Recommendations Active Problems:   Fever   ESRD (end stage renal disease) ([place])  1. Fever in dialysis patient; unknown source - patient presented from dialysis due to fever of 101F; reports she was cold this morning but otherwise felt normal yesterday and denies any other complaints currently - dialysis initiated 4/27; currently with central line access - Patient with temp of 100.68F on arrival and mild tachycardia; lactate 5.3>1.7 after IVF - CXR non-acute - Denies any concerns or complaints currently; exam unremarkable  - Upon meeting the patient for admission; reports she would like to discharge home if possible - Patient was given Vanc and Cefepime in ED  - Blood cx drawn including from central line - Shared-decision making with patient, EDP and myself; she desires to discharge home and reports she will come back if new symptoms develop or fever recurs - discussed risk of possible bacteremia - okay to hold further abx at this time until source identified  - next dialysis session on 7/27 - Okay to continue to use central line access at this time as blood cx pending  - hold further IVF   - UA pending collection when patient able to void  - PCP messaged and updated on situation; requested that she check in on patient in next 1-2 days   Please contact me if I can be of assistance in the meanwhile. Thank you for this consultation.  Chief Complaint: Fever  HPI:  Patient presented from dialysis due to fever of 101F; reports she was cold this morning but otherwise felt normal yesterday and denies any other complaints currently. Currently without chills. Reports that her legs have been bothering her and she has noted dark  stools. She lives alone but does have a friend that checks on her frequently. Denies current tobacco or alcohol use. Denies headache, dizziness, cough, SOB, chest pain, abdominal pain, nausea, vomiting, diarrhea, constipation, dysuria, hematuria, hematochezia, difficulty moving arms/legs, speech difficulty, trouble eating, confusion or any other complaints.  Review of Systems:  A comprehensive ROS obtained and negative unless otherwise noted above in HPI.   Past Medical History:  Diagnosis Date  . AKI (acute kidney injury) ([place]) 01/2020  . Cancer [facility]) 2005   Breast Cancer  . COPD (chronic obstructive pulmonary disease) ([place])   . Depression   . Diabetes mellitus without complication ([place])   . Hyperlipidemia    Past Surgical History:  Procedure Laterality Date  . BREAST SURGERY  2005  . CATARACT EXTRACTION    . CHOLECYSTECTOMY  2013  . IR FLUORO GUIDE CV LINE RIGHT  02/01/2020  . IR US GUIDE VASC ACCESS RIGHT  02/01/2020   Social History:  reports that she has quit smoking. Her smoking use included cigarettes. She has a 40.00 pack-year smoking history. She has never used smokeless tobacco. She reports that she does not drink alcohol and does not use drugs.  No Known Allergies Family History  Problem Relation Age of Onset  . Arthritis Mother   . Alzheimer's disease Mother   . Dementia Father   . COPD Brother   . COPD Brother     Prior to Admission medications   Medication Sig Start Date End Date Taking? Authorizing Provider  albuterol (VENTOLIN HFA) 108 (90 Base) MCG/ACT inhaler TAKE 2  PUFFS BY MOUTH EVERY 6 HOURS AS NEEDED FOR WHEEZE OR SHORTNESS OF BREATH Patient taking differently: Inhale 2 puffs into the lungs every 6 (six) hours as needed.  04/24/20  Yes Bedsole, Amy E, MD  ipratropium-albuterol (DUONEB) 0.5-2.5 (3) MG/3ML SOLN Take 3 mLs by nebulization every 6 (six) hours as needed. Patient taking differently: Take 3 mLs by nebulization every 6 (six) hours as needed (for  wheezing).  02/19/20 04/29/29 Yes Alma Friendly, MD  JANUVIA 100 MG tablet TAKE 1 TABLET BY MOUTH DAILY Patient taking differently: Take 100 mg by mouth daily.  03/04/20  Yes Bedsole, Amy E, MD  amLODipine (NORVASC) 10 MG tablet Take 1 tablet (10 mg total) by mouth daily. Patient not taking: Reported on 04/29/2020 02/20/20 04/29/29  Alma Friendly, MD  furosemide (LASIX) 40 MG tablet Take 1 tablet (40 mg total) by mouth 2 (two) times daily. Patient not taking: Reported on 04/29/2020 02/19/20 04/29/29  Alma Friendly, MD  metoprolol tartrate (LOPRESSOR) 25 MG tablet Take 1 tablet (25 mg total) by mouth 2 (two) times daily. Patient not taking: Reported on 04/29/2020 02/19/20 04/29/29  Alma Friendly, MD  rosuvastatin (CRESTOR) 20 MG tablet TAKE 1 TABLET BY MOUTH EVERY DAY Patient not taking: Reported on 04/29/2020 03/25/20   [name], MD  venlafaxine XR (EFFEXOR-XR) 37.5 MG 24 hr capsule TAKE 1 CAPSULE BY MOUTH DAILY WITH BREAKFAST. Patient not taking: Reported on 04/29/2020 03/20/20   [name], MD   Physical Exam: Blood pressure (!) 119/50, pulse (!) 109, temperature (!) 100.4 F (38 C), temperature source Oral, resp. rate (!) 25, height 5\' 2"  (1.575 m), weight 49.9 kg, SpO2 98 %. Vitals:   04/29/20 2100 04/29/20 2115  BP: (!) 111/59 (!) 119/50  Pulse: (!) 109 (!) 109  Resp: 22 (!) 25  Temp:    SpO2: 98% 98%    . General:  Appears calm and comfortable. AAOx4.  . Eyes: EOMI, normal lids, irises & conjunctiva . ENT: grossly normal hearing, lips & tongue . Neck: normal ROM . Cardiovascular: Mild tachycardia, no m/r/g. No LE edema. [name] Respiratory: CTA bilaterally, no w/r/r. Normal respiratory effort. . Abdomen: soft, ntnd . Skin: no rash or induration seen on limited exam . Musculoskeletal: grossly normal tone BUE/BLE . Psychiatric: grossly normal mood and affect, speech fluent and appropriate . Neurologic: grossly non-focal.  Labs on Admission:  Basic Metabolic  Panel: Recent Labs  Lab 04/29/20 1800  NA 133*  K 3.8  CL 92*  CO2 30  GLUCOSE 160*  BUN 11  CREATININE 2.27*  CALCIUM 7.3*   Liver Function Tests: Recent Labs  Lab 04/29/20 1800  AST 209*  ALT 119*  ALKPHOS 68  BILITOT 0.6  PROT 5.4*  ALBUMIN 2.4*   No results for input(s): LIPASE, AMYLASE in the last 168 hours. No results for input(s): AMMONIA in the last 168 hours. CBC: Recent Labs  Lab 04/29/20 1800  WBC 8.9  NEUTROABS 8.4*  HGB 7.5*  HCT 24.3*  MCV 102.1*  PLT 141*   Cardiac Enzymes: No results for input(s): CKTOTAL, CKMB, CKMBINDEX, TROPONINI in the last 168 hours. BNP: Invalid input(s): POCBNP CBG: No results for input(s): GLUCAP in the last 168 hours.  Radiological Exams on Admission: DG Chest Port 1 View  Result Date: 04/29/2020 CLINICAL DATA:  Fever EXAM: PORTABLE CHEST 1 VIEW COMPARISON:  02/16/2020 FINDINGS: Single frontal view of the chest demonstrates a stable cardiac silhouette. Right internal jugular catheter tip overlies the  atrial caval junction. No airspace disease, effusion, or pneumothorax. Postsurgical changes left mastectomy. No acute bony abnormalities. IMPRESSION: 1. No acute intrathoracic process. Electronically Signed   By: Randa Ngo M.D.   On: 04/29/2020 19:55    EKG: Independently reviewed. HR 110. Sinus tachycardia. QTc 481. No STEMI. Compared to EKG on 02/16/2020, no significant change beyond tachycardia.   Time spent: 77 minutes  [place] [facility] Pager [phone]  If 7PM-7AM, please contact night-coverage [email or web address] Password [facility] 04/30/2020, 12:27 AM

## 2020-04-29 NOTE — ED Provider Notes (Signed)
[place] EMERGENCY DEPARTMENT Provider Note   CSN: [id number] Arrival date & time: 04/29/20  1659     History Chief Complaint  Patient presents with  . Weakness  . Fever    DASJA BRASE is a 74 y.o. female.  HPI      NIOMA MCCUBBINS is a 74 y.o. female, with a history of COPD, DM, hyperlipidemia, renal failure, presenting to the ED with fever noted today.  Patient states she was at dialysis when they noted she had a fever of 101 F. She notes nausea and vomiting along with increased coughing since yesterday.  She is fairly new to dialysis having just had her central line access placed to the right chest about 2 months ago.  She is on a Tuesday, Thursday, Saturday schedule. She still makes some urine.  UTI as recently as April 2021. She notes she has had some dark stool over the last few days, but also states she has been taking supplemental iron. Denies acute diarrhea, abdominal pain, shortness of breath, chest pain, neck pain/stiffness, urinary symptoms, or any other complaints.  Past Medical History:  Diagnosis Date  . AKI (acute kidney injury) ([place]) 01/2020  . Cancer [facility]) 2005   Breast Cancer  . COPD (chronic obstructive pulmonary disease) ([place])   . Depression   . Diabetes mellitus without complication ([place])   . Hyperlipidemia     Patient Active Problem List   Diagnosis Date Noted  . Fever 04/30/2020  . ESRD (end stage renal disease) ([place]) 04/30/2020  . Hypocalcemia 01/19/2020  . Metabolic acidosis 00/86/7619  . Acute respiratory failure with hypoxia ([place]) 01/19/2020  . UTI (urinary tract infection) 01/19/2020  . Encounter for central line placement   . Pain and swelling of ankle, left   . Pressure injury of skin 01/09/2020  . Renal failure 01/08/2020  . Acute renal failure on dialysis ([place])   . Hepatomegaly 07/30/2019  . Right carotid bruit 07/30/2019  . SIADH (syndrome of inappropriate ADH production) ([place]) 04/06/2019  . B12  deficiency 04/06/2019  . Balance problem 03/18/2019  . Ventral hernia 04/03/2017  . Hypertension 02/28/2017  . Chronic insomnia 10/26/2013  . Diabetic autonomic neuropathy ([place]) 08/12/2013  . PULMONARY NODULE 09/27/2009  . ADENOCARCINOMA, BREAST 02/28/2009  . Normocytic anemia 06/24/2008  . Moderate COPD (chronic obstructive pulmonary disease) ([place]) 03/14/2008  . Major depressive disorder, recurrent episode, moderate ([place]) 08/11/2007  . Diabetes mellitus due to underlying condition, controlled, with neurologic complication ([place]) 50/93/2671  . HYPERLIPIDEMIA, MIXED 04/30/2007  . CIGARETTE SMOKER 04/22/2007    Past Surgical History:  Procedure Laterality Date  . BREAST SURGERY  2005  . CATARACT EXTRACTION    . CHOLECYSTECTOMY  2013  . IR FLUORO GUIDE CV LINE RIGHT  02/01/2020  . IR US GUIDE VASC ACCESS RIGHT  02/01/2020     OB History   No obstetric history on file.     Family History  Problem Relation Age of Onset  . Arthritis Mother   . Alzheimer's disease Mother   . Dementia Father   . COPD Brother   . COPD Brother     Social History   Tobacco Use  . Smoking status: Former Smoker    Packs/day: 1.00    Years: 40.00    Pack years: 40.00    Types: Cigarettes  . Smokeless tobacco: Never Used  Vaping Use  . Vaping Use: Never used  Substance Use Topics  . Alcohol use: No  . Drug  use: No    Home Medications Prior to Admission medications   Medication Sig Start Date End Date Taking? Authorizing Provider  albuterol (VENTOLIN HFA) 108 (90 Base) MCG/ACT inhaler TAKE 2 PUFFS BY MOUTH EVERY 6 HOURS AS NEEDED FOR WHEEZE OR SHORTNESS OF BREATH Patient taking differently: Inhale 2 puffs into the lungs every 6 (six) hours as needed.  04/24/20  Yes Bedsole, Amy E, MD  ipratropium-albuterol (DUONEB) 0.5-2.5 (3) MG/3ML SOLN Take 3 mLs by nebulization every 6 (six) hours as needed. Patient taking differently: Take 3 mLs by nebulization every 6 (six) hours as needed (for  wheezing).  02/19/20 04/29/29 Yes Alma Friendly, MD  JANUVIA 100 MG tablet TAKE 1 TABLET BY MOUTH DAILY Patient taking differently: Take 100 mg by mouth daily.  03/04/20  Yes Bedsole, Amy E, MD  amLODipine (NORVASC) 10 MG tablet Take 1 tablet (10 mg total) by mouth daily. Patient not taking: Reported on 04/29/2020 02/20/20 04/29/29  Alma Friendly, MD  furosemide (LASIX) 40 MG tablet Take 1 tablet (40 mg total) by mouth 2 (two) times daily. Patient not taking: Reported on 04/29/2020 02/19/20 04/29/29  Alma Friendly, MD  metoprolol tartrate (LOPRESSOR) 25 MG tablet Take 1 tablet (25 mg total) by mouth 2 (two) times daily. Patient not taking: Reported on 04/29/2020 02/19/20 04/29/29  Alma Friendly, MD  rosuvastatin (CRESTOR) 20 MG tablet TAKE 1 TABLET BY MOUTH EVERY DAY Patient not taking: Reported on 04/29/2020 03/25/20   [name], MD  venlafaxine XR (EFFEXOR-XR) 37.5 MG 24 hr capsule TAKE 1 CAPSULE BY MOUTH DAILY WITH BREAKFAST. Patient not taking: Reported on 04/29/2020 03/20/20   [name], MD    Allergies    Patient has no known allergies.  Review of Systems   Review of Systems  Constitutional: Positive for fever.  Respiratory: Negative for shortness of breath.   Cardiovascular: Negative for chest pain and leg swelling.  Gastrointestinal: Positive for nausea and vomiting. Negative for abdominal pain.  Genitourinary: Negative for dysuria, flank pain and hematuria.  Musculoskeletal: Negative for back pain.  Neurological: Negative for syncope, weakness and headaches.  All other systems reviewed and are negative.   Physical Exam Updated Vital Signs BP (!) 111/46   Pulse (!) 117   Temp (!) 100.4 F (38 C) (Oral)   Resp 22   SpO2 100%   Physical Exam Vitals and nursing note reviewed.  Constitutional:      General: She is not in acute distress.    Appearance: She is well-developed. She is not diaphoretic.  HENT:     Head: Normocephalic and atraumatic.      Mouth/Throat:     Mouth: Mucous membranes are moist.     Pharynx: Oropharynx is clear.  Eyes:     Conjunctiva/sclera: Conjunctivae normal.  Cardiovascular:     Rate and Rhythm: Regular rhythm. Tachycardia present.     Pulses: Normal pulses.          Radial pulses are 2+ on the right side and 2+ on the left side.       Posterior tibial pulses are 2+ on the right side and 2+ on the left side.     Heart sounds: Normal heart sounds.     Comments: Tactile temperature in the extremities appropriate and equal bilaterally. Pulmonary:     Effort: Pulmonary effort is normal. No respiratory distress.     Breath sounds: Normal breath sounds.  Chest:     Comments: Patient has a  central catheter in place in the right upper chest.  No tenderness, erythema, swelling, pain, or purulence to the catheter site. Abdominal:     Palpations: Abdomen is soft.     Tenderness: There is no abdominal tenderness. There is no guarding.  Genitourinary:    Comments: Rectal Exam:  No external hemorrhoids, fissures, or lesions noted.  No frank blood or melena. No stool burden.  Remnants of dark stool, however, does not have the consistency of melena.  Suspect this might be a discolored stool from her iron supplements. No rectal tenderness. No foreign bodies noted.   RN, Mackey Birchwood, served as chaperone during the rectal exam. Musculoskeletal:     Cervical back: Neck supple.     Right lower leg: No edema.     Left lower leg: No edema.  Lymphadenopathy:     Cervical: No cervical adenopathy.  Skin:    General: Skin is warm and dry.  Neurological:     Mental Status: She is alert.  Psychiatric:        Mood and Affect: Mood and affect normal.        Speech: Speech normal.        Behavior: Behavior normal.     ED Results / Procedures / Treatments   Labs (all labs ordered are listed, but only abnormal results are displayed) Labs Reviewed  LACTIC ACID, PLASMA - Abnormal; Notable for the following components:       Result Value   Lactic Acid, Venous 5.3 (*)    All other components within normal limits  COMPREHENSIVE METABOLIC PANEL - Abnormal; Notable for the following components:   Sodium 133 (*)    Chloride 92 (*)    Glucose, Bld 160 (*)    Creatinine, Ser 2.27 (*)    Calcium 7.3 (*)    Total Protein 5.4 (*)    Albumin 2.4 (*)    AST 209 (*)    ALT 119 (*)    GFR calc non Af Amer 21 (*)    GFR calc Af Amer 24 (*)    All other components within normal limits  CBC WITH DIFFERENTIAL/PLATELET - Abnormal; Notable for the following components:   RBC 2.38 (*)    Hemoglobin 7.5 (*)    HCT 24.3 (*)    MCV 102.1 (*)    Platelets 141 (*)    Neutro Abs 8.4 (*)    Lymphs Abs 0.2 (*)    All other components within normal limits  PROTIME-INR - Abnormal; Notable for the following components:   Prothrombin Time 17.5 (*)    INR 1.5 (*)    All other components within normal limits  APTT - Abnormal; Notable for the following components:   aPTT 49 (*)    All other components within normal limits  SARS CORONAVIRUS 2 BY RT PCR (HOSPITAL ORDER, [facility])  CULTURE, BLOOD (ROUTINE X 2)  CULTURE, BLOOD (ROUTINE X 2)  URINE CULTURE  CULTURE, BLOOD (SINGLE)  LACTIC ACID, PLASMA  URINALYSIS, ROUTINE W REFLEX MICROSCOPIC  POC OCCULT BLOOD, ED   Hemoglobin  Date Value Ref Range Status  04/29/2020 7.5 (L) 12.0 - 15.0 g/dL Final  02/19/2020 9.3 (L) 12.0 - 15.0 g/dL Final  02/18/2020 8.2 (L) 12.0 - 15.0 g/dL Final  02/17/2020 9.0 (L) 12.0 - 15.0 g/dL Final    BUN  Date Value Ref Range Status  04/29/2020 11 8 - 23 mg/dL Final  02/19/2020 35 (H) 8 - 23 mg/dL Final  02/18/2020 76 (H) 8 - 23 mg/dL Final  02/17/2020 55 (H) 8 - 23 mg/dL Final   Creatinine, Ser  Date Value Ref Range Status  04/29/2020 2.27 (H) 0.44 - 1.00 mg/dL Final  02/19/2020 2.91 (H) 0.44 - 1.00 mg/dL Final    Comment:    DELTA CHECK NOTED DIALYSIS   02/18/2020 4.83 (H) 0.44 - 1.00 mg/dL Final    02/17/2020 4.06 (H) 0.44 - 1.00 mg/dL Final    Comment:    DELTA CHECK NOTED DIALYSIS      EKG EKG Interpretation  Date/Time:  Saturday April 29 2020 20:32:21 EDT Ventricular Rate:  109 PR Interval:    QRS Duration: 94 QT Interval:  357 QTC Calculation: 481 R Axis:   47 Text Interpretation: Sinus tachycardia Nonspecific T abnormalities, anterior leads Confirmed by Virgel Manifold [phone]) on 04/29/2020 10:17:25 PM   Radiology DG Chest Port 1 View  Result Date: 04/29/2020 CLINICAL DATA:  Fever EXAM: PORTABLE CHEST 1 VIEW COMPARISON:  02/16/2020 FINDINGS: Single frontal view of the chest demonstrates a stable cardiac silhouette. Right internal jugular catheter tip overlies the atrial caval junction. No airspace disease, effusion, or pneumothorax. Postsurgical changes left mastectomy. No acute bony abnormalities. IMPRESSION: 1. No acute intrathoracic process. Electronically Signed   By: Randa Ngo M.D.   On: 04/29/2020 19:55    Procedures Procedures (including critical care time)  Medications Ordered in ED Medications  vancomycin (VANCOCIN) IVPB 1000 mg/200 mL premix (1,000 mg Intravenous New Bag/Given 04/30/20 0010)  ceFEPIme (MAXIPIME) 1 g in sodium chloride 0.9 % 100 mL IVPB (has no administration in time range)  vancomycin (VANCOREADY) IVPB 500 mg/100 mL (has no administration in time range)  lactated ringers bolus 500 mL (has no administration in time range)  ceFEPIme (MAXIPIME) 2 g in sodium chloride 0.9 % 100 mL IVPB (0 g Intravenous Stopped 04/29/20 2226)  lactated ringers bolus 1,000 mL (1,000 mLs Intravenous New Bag/Given 04/29/20 2227)  acetaminophen (TYLENOL) tablet 650 mg (650 mg Oral Given 04/30/20 0011)    ED Course  I have reviewed the triage vital signs and the nursing notes.  Pertinent labs & imaging results that were available during my care of the patient were reviewed by me and considered in my medical decision making (see chart for details).  Clinical  Course as of Apr 30 38  Sat Apr 29, 2020  2326 Spoke with Dr. Marice Potter, hospitalist.  States she will come evaluate the patient for admission.   [SJ]  2345 Dr. Marice Potter evaluated the patient and returns to speak with Dr. Wilson Singer and I.  Suggests perhaps this patient can be handled as an outpatient since we do not have a clear source.  She has no leukocytosis.  Her lactic acid cleared to normal further quickly.  Clinically, she does not appear unwell.   [SJ]  2355 Myself and Dr. Marice Potter spoke with the patient at the bedside. Patient states she no longer feels poorly and has a strong preference towards discharge. Shared decision-making conversation regarding admission versus discharge. Advantages and risks discussed. Patient agreed to return should she feel worse or blood cultures are positive.   [SJ]    Clinical Course User Index [SJ] Jobe Mutch, Helane Gunther, PA-C   MDM Rules/Calculators/A&P                          Patient presents with generalized weakness, fever, nausea, vomiting, and intermittent cough. She is febrile and tachycardic upon arrival.  Nontoxic-appearing,  not hypotensive, maintains excellent SPO2 on room air, no apparent distress.  I personally reviewed and interpreted the patient's labs and imaging studies. Code sepsis activated.  No leukocytosis.  Initial lactic acidosis, cleared rather quickly. Unknown source, but catheter-based infection is possible.  Findings and plan of care discussed with Virgel Manifold, MD. Dr. Wilson Singer personally evaluated and examined this patient.   End of shift patient care handoff report given to Quincy Carnes, PA-C. Plan: Review UA. Patient to finish vancomycin infusion. Discharge.   Final Clinical Impression(s) / ED Diagnoses Final diagnoses:  Fever, unspecified fever cause    Rx / DC Orders ED Discharge Orders    None       Layla Maw 04/30/20 0040    Virgel Manifold, MD 04/30/20 [phone]

## 2020-04-29 NOTE — ED Notes (Signed)
Left arm pink restricted extremity band applied.

## 2020-04-30 DIAGNOSIS — N186 End stage renal disease: Secondary | ICD-10-CM

## 2020-04-30 DIAGNOSIS — R509 Fever, unspecified: Secondary | ICD-10-CM

## 2020-04-30 LAB — URINALYSIS, ROUTINE W REFLEX MICROSCOPIC
Bilirubin Urine: NEGATIVE
Glucose, UA: NEGATIVE mg/dL
Ketones, ur: NEGATIVE mg/dL
Nitrite: NEGATIVE
Protein, ur: 100 mg/dL — AB
Specific Gravity, Urine: 1.006 (ref 1.005–1.030)
pH: 9 — ABNORMAL HIGH (ref 5.0–8.0)

## 2020-04-30 NOTE — Discharge Instructions (Signed)
There are blood cultures pending.  If these are positive, we will call you and you will need to be admitted. Return to the emergency department should you feel worse, have persistent fever, chest pain, shortness of breath, abdominal pain, persistent vomiting, or any other major concerns.  Additionally, there were some abnormalities in your liver function test.  Please follow-up with your primary care provider on this matter to have these further assessed.

## 2020-04-30 NOTE — Progress Notes (Signed)
Communicated through secure chat with Arlean Hopping PA concerning fluid resuscitation/sepsis protocol. ESRD. PA ordering 30cc/kg

## 2020-04-30 NOTE — ED Notes (Signed)
No urine obtain from placing pt. On bedpan. Pt. Will be in and out cath.

## 2020-04-30 NOTE — ED Notes (Signed)
Pt. Stated, she felt like she needed to use the bathroom. Pt. Placed on bedpan. Nurse will continue to monitor.

## 2020-04-30 NOTE — ED Notes (Signed)
Pt placed in lobby to call for ride. Pt agreeable with plan.

## 2020-04-30 NOTE — ED Provider Notes (Signed)
Patient seen by previous provider and hospitalist service.  Reviewing records reveals that the patient wanted to be discharged and arrangements were made by hospitalist for antibiotic treatment here in the ED and then needs further antibiotics at dialysis.  Recheck this morning reveals that patient feels well, no fever.  Vital signs are all normal.  Did reaffirm with the patient that if she has any further symptoms she needs to return to the ER immediately.   Orpah Greek, MD 04/30/20 [phone]

## 2020-04-30 NOTE — ED Notes (Addendum)
Pt. LR bolus rate decreased to 530ml/hr, nurse spoke to PA about fluid rate decrease. PA stated reason for decrease is because pt. Is a dialysis pt. Fluid currently infusing, nurse will obtain vitals once fluid are finished.

## 2020-05-01 ENCOUNTER — Other Ambulatory Visit: Payer: Self-pay | Admitting: *Deleted

## 2020-05-01 ENCOUNTER — Encounter: Payer: Self-pay | Admitting: *Deleted

## 2020-05-01 NOTE — Patient Outreach (Signed)
[facility]) Care Management [facility] CM Telephone Outreach Post-hospital discharge day # 74 without hospital re-admission Unsuccessful (consecutive) outreach attempt # 1- previously engaged patient  05/01/2020  Cindy Burns 1946/06/30 [id number]  Unsuccessful telephone outreach to [name], 74 y/o female referred to [facility] CM byinpatient RN CMon Feb 17, 2020; patient was recently hospitalized April 3- Feb 19, 2020 foracute renal failure/ DKA/ acute respiratory failure and required initiation of hemodialysis while hospitalized.Patient has history including, but not limited to, breast cancer; DM; COPD; depression; HTN/ HLD; hepatomegaly; and tobacco use. Patient was discharged home to self-care with home health services through [facility], which are now completed.    Noted through review of EHR that patient presented to ED 04/29/20 for fever; she was discharged home to self- care.   HIPAA compliant voice mail message left for patient, requesting return call back.  Plan:  Will re-attempt [facility] CM telephone outreach within 4 business days if I do not hear back from patient first.  [name], RN, BSN, [place] Coordinator [facility] Care Management  [phone]

## 2020-05-02 ENCOUNTER — Telehealth (HOSPITAL_BASED_OUTPATIENT_CLINIC_OR_DEPARTMENT_OTHER): Payer: Self-pay | Admitting: Emergency Medicine

## 2020-05-02 ENCOUNTER — Telehealth: Payer: Self-pay | Admitting: *Deleted

## 2020-05-02 DIAGNOSIS — D649 Anemia, unspecified: Secondary | ICD-10-CM | POA: Diagnosis not present

## 2020-05-02 DIAGNOSIS — Z992 Dependence on renal dialysis: Secondary | ICD-10-CM | POA: Diagnosis not present

## 2020-05-02 DIAGNOSIS — D688 Other specified coagulation defects: Secondary | ICD-10-CM | POA: Diagnosis not present

## 2020-05-02 DIAGNOSIS — D509 Iron deficiency anemia, unspecified: Secondary | ICD-10-CM | POA: Diagnosis not present

## 2020-05-02 DIAGNOSIS — T8249XA Other complication of vascular dialysis catheter, initial encounter: Secondary | ICD-10-CM | POA: Diagnosis not present

## 2020-05-02 DIAGNOSIS — D689 Coagulation defect, unspecified: Secondary | ICD-10-CM | POA: Diagnosis not present

## 2020-05-02 DIAGNOSIS — N2581 Secondary hyperparathyroidism of renal origin: Secondary | ICD-10-CM | POA: Diagnosis not present

## 2020-05-02 DIAGNOSIS — R52 Pain, unspecified: Secondary | ICD-10-CM | POA: Diagnosis not present

## 2020-05-02 DIAGNOSIS — N170 Acute kidney failure with tubular necrosis: Secondary | ICD-10-CM | POA: Diagnosis not present

## 2020-05-02 LAB — BLOOD CULTURE ID PANEL (REFLEXED)

## 2020-05-02 LAB — URINE CULTURE: Culture: >=100000 — AB

## 2020-05-02 NOTE — Telephone Encounter (Signed)
Please call pt... seen for fever at ED... blood cultures negative, but urine culture returned with [facility]. How is she feeling, ? Any fever and has she been given antibiotics for UTI yet at dialysis?   Left message for Ms. Mette to return my call.

## 2020-05-02 NOTE — Progress Notes (Signed)
See phone note

## 2020-05-02 NOTE — Telephone Encounter (Signed)
Left message for Cindy Burns to return my call.

## 2020-05-03 ENCOUNTER — Telehealth: Payer: Self-pay

## 2020-05-03 ENCOUNTER — Telehealth: Payer: Self-pay | Admitting: Family Medicine

## 2020-05-03 NOTE — Telephone Encounter (Signed)
Called pt in regards to [facility] done on 04/30/20  Per Pharm D the early report from [facility] on 04/30/20 shows E Coli.  REcommedned that pt return for follow up. Spoke with pt  Cindy Burns she was going to return call to PCP office, who had called regarding UC with e coli. Instructed to either reutrn to hospital of to PCP for evaluation.

## 2020-05-03 NOTE — Progress Notes (Signed)
ED Antimicrobial Stewardship Positive Culture Follow Up   Cindy Burns is an 74 y.o. female who presented to [facility] on 04/29/2020 with a chief complaint of  Chief Complaint  Patient presents with  . Weakness  . Fever    Recent Results (from the past 720 hour(s))  Blood Culture (routine x 2)     Status: Abnormal (Preliminary result)   Collection Time: 04/29/20  6:00 PM   Specimen: BLOOD  Result Value Ref Range Status   Specimen Description BLOOD RIGHT ANTECUBITAL  Final   Special Requests   Final    BOTTLES DRAWN AEROBIC AND ANAEROBIC Blood Culture results may not be optimal due to an inadequate volume of blood received in culture bottles   Culture  Setup Time (A)  Final    GRAM VARIABLE ROD AEROBIC BOTTLE ONLY CRITICAL RESULT CALLED TO, READ BACK BY AND VERIFIED WITH: CALLED TO [facility] [id number] AT 1210 SK     Culture (A)  Final    ESCHERICHIA COLI SUSCEPTIBILITIES TO FOLLOW Performed at [facility], [place] [address] [place], [place] [address]    Report Status PENDING  Incomplete  Blood Culture ID Panel (Reflexed)     Status: Abnormal   Collection Time: 04/29/20  6:00 PM  Result Value Ref Range Status   Enterococcus species NOT DETECTED NOT DETECTED Final   Listeria monocytogenes NOT DETECTED NOT DETECTED Final   Staphylococcus species NOT DETECTED NOT DETECTED Final   Staphylococcus aureus (BCID) NOT DETECTED NOT DETECTED Final   Streptococcus species NOT DETECTED NOT DETECTED Final   Streptococcus agalactiae NOT DETECTED NOT DETECTED Final   Streptococcus pneumoniae NOT DETECTED NOT DETECTED Final   Streptococcus pyogenes NOT DETECTED NOT DETECTED Final   Acinetobacter baumannii NOT DETECTED NOT DETECTED Final   Enterobacteriaceae species DETECTED (A) NOT DETECTED Final    Comment: Enterobacteriaceae represent a large family of gram-negative bacteria, not a single organism. CRITICAL RESULT CALLED TO, READ BACK BY AND VERIFIED WITH: CALLED TO  [facility] [id number] AT 1210 SK     Enterobacter cloacae complex NOT DETECTED NOT DETECTED Final   Escherichia coli DETECTED (A) NOT DETECTED Final    Comment: CRITICAL RESULT CALLED TO, READ BACK BY AND VERIFIED WITH: CALLED TO [facility] [id number] AT 1210 SK     Klebsiella oxytoca NOT DETECTED NOT DETECTED Final   Klebsiella pneumoniae NOT DETECTED NOT DETECTED Final   Proteus species NOT DETECTED NOT DETECTED Final   Serratia marcescens NOT DETECTED NOT DETECTED Final   Carbapenem resistance NOT DETECTED NOT DETECTED Final   Haemophilus influenzae NOT DETECTED NOT DETECTED Final   Neisseria meningitidis NOT DETECTED NOT DETECTED Final   Pseudomonas aeruginosa NOT DETECTED NOT DETECTED Final   Candida albicans NOT DETECTED NOT DETECTED Final   Candida glabrata NOT DETECTED NOT DETECTED Final   Candida krusei NOT DETECTED NOT DETECTED Final   Candida parapsilosis NOT DETECTED NOT DETECTED Final   Candida tropicalis NOT DETECTED NOT DETECTED Final    Comment: Performed at [facility], [place]. [address] [place], [place] [address]  SARS Coronavirus 2 by RT PCR (hospital order, performed in [facility] hospital lab) Nasopharyngeal Nasopharyngeal Swab     Status: None   Collection Time: 04/29/20  9:00 PM   Specimen: Nasopharyngeal Swab  Result Value Ref Range Status   SARS Coronavirus 2 NEGATIVE NEGATIVE Final    Comment: (NOTE) SARS-CoV-2 target  nucleic acids are NOT DETECTED.  The SARS-CoV-2 RNA is generally detectable in upper and lower respiratory specimens during the acute phase of infection. The lowest concentration of SARS-CoV-2 viral copies this assay can detect is 250 copies / mL. A negative result does not preclude SARS-CoV-2 infection and should not be used as the sole basis for treatment or other patient management decisions.  A negative result may occur with improper specimen collection / handling, submission of specimen other than  nasopharyngeal swab, presence of viral mutation(s) within the areas targeted by this assay, and inadequate number of viral copies (<250 copies / mL). A negative result must be combined with clinical observations, patient history, and epidemiological information.  Fact Sheet for Patients:   StrictlyIdeas.no  Fact Sheet for Healthcare Providers: BankingDealers.co.za  This test is not yet approved or  cleared by the [place] FDA and has been authorized for detection and/or diagnosis of SARS-CoV-2 by FDA under an Emergency Use Authorization (EUA).  This EUA will remain in effect (meaning this test can be used) for the duration of the COVID-19 declaration under Section 564(b)(1) of the Act, 21 U.S.C. section 360bbb-3(b)(1), unless the authorization is terminated or revoked sooner.  Performed at [facility], [place] [address] [place], [place] [address]   Culture, blood (single)     Status: None (Preliminary result)   Collection Time: 04/29/20  9:56 PM   Specimen: BLOOD RIGHT FOREARM  Result Value Ref Range Status   Specimen Description BLOOD RIGHT FOREARM  Final   Special Requests   Final    BOTTLES DRAWN AEROBIC AND ANAEROBIC Blood Culture results may not be optimal due to an inadequate volume of blood received in culture bottles   Culture   Final    NO GROWTH 3 DAYS Performed at [facility], [place] [address] [place], [place] [address]    Report Status PENDING  Incomplete  Urine culture     Status: Abnormal   Collection Time: 04/30/20  1:43 AM   Specimen: Urine, Catheterized  Result Value Ref Range Status   Specimen Description URINE, CATHETERIZED  Final   Special Requests   Final    NONE Performed at [facility], [place] [address] [place], [place] [address]    Culture >=100,000 COLONIES/mL ESCHERICHIA COLI (A)  Final   Report Status 05/02/2020 FINAL  Final   Organism ID, Bacteria ESCHERICHIA COLI (A)  Final       Susceptibility   Escherichia coli - MIC*    AMPICILLIN 8 SENSITIVE Sensitive     CEFAZOLIN <=4 SENSITIVE Sensitive     CEFTRIAXONE <=0.25 SENSITIVE Sensitive     CIPROFLOXACIN <=0.25 SENSITIVE Sensitive     GENTAMICIN <=1 SENSITIVE Sensitive     IMIPENEM <=0.25 SENSITIVE Sensitive     NITROFURANTOIN <=16 SENSITIVE Sensitive     TRIMETH/SULFA <=20 SENSITIVE Sensitive     AMPICILLIN/SULBACTAM <=2 SENSITIVE Sensitive     PIP/TAZO <=4 SENSITIVE Sensitive     * >=100,000 COLONIES/mL ESCHERICHIA COLI    []  Treated with N/A, organism resistant to prescribed antimicrobial [x]  Patient discharged originally without antimicrobial agent and treatment is now indicated  New antibiotic prescription: Pt needs to return to the hospital for evaluation of positive blood culture  ED Provider: Nuala Alpha, PA-C   [place], Rande Lawman 05/03/2020, 9:46 AM Clinical Pharmacist Monday - Friday phone -  [phone] Saturday - Sunday phone - [phone]

## 2020-05-03 NOTE — Progress Notes (Signed)
  Chronic Care Management   Note  05/03/2020 Name: Cindy Burns MRN: [id number] DOB: [birth year]  Cindy Burns is a 74 y.o. year old female who is a primary care patient of Bedsole, Amy E, MD. I reached out to Cindy Burns by phone today in response to a referral sent by [name]'s PCP, [name], MD.   Ms. Kidney was given information about Chronic Care Management services today including:  1. CCM service includes personalized support from designated clinical staff supervised by her physician, including individualized plan of care and coordination with other care providers 2. 24/7 contact phone numbers for assistance for urgent and routine care needs. 3. Service will only be billed when office clinical staff spend 20 minutes or more in a month to coordinate care. 4. Only one practitioner may furnish and bill the service in a calendar month. 5. The patient may stop CCM services at any time (effective at the end of the month) by phone call to the office staff.   Patient agreed to services and verbal consent obtained.   Follow up plan:   Earney Hamburg Upstream Scheduler

## 2020-05-04 DIAGNOSIS — D509 Iron deficiency anemia, unspecified: Secondary | ICD-10-CM | POA: Diagnosis not present

## 2020-05-04 DIAGNOSIS — D649 Anemia, unspecified: Secondary | ICD-10-CM | POA: Diagnosis not present

## 2020-05-04 DIAGNOSIS — N170 Acute kidney failure with tubular necrosis: Secondary | ICD-10-CM | POA: Diagnosis not present

## 2020-05-04 DIAGNOSIS — D688 Other specified coagulation defects: Secondary | ICD-10-CM | POA: Diagnosis not present

## 2020-05-04 DIAGNOSIS — N2581 Secondary hyperparathyroidism of renal origin: Secondary | ICD-10-CM | POA: Diagnosis not present

## 2020-05-04 DIAGNOSIS — D689 Coagulation defect, unspecified: Secondary | ICD-10-CM | POA: Diagnosis not present

## 2020-05-04 DIAGNOSIS — N179 Acute kidney failure, unspecified: Secondary | ICD-10-CM | POA: Diagnosis not present

## 2020-05-04 DIAGNOSIS — R52 Pain, unspecified: Secondary | ICD-10-CM | POA: Diagnosis not present

## 2020-05-04 DIAGNOSIS — T8249XA Other complication of vascular dialysis catheter, initial encounter: Secondary | ICD-10-CM | POA: Diagnosis not present

## 2020-05-04 DIAGNOSIS — Z992 Dependence on renal dialysis: Secondary | ICD-10-CM | POA: Diagnosis not present

## 2020-05-04 LAB — CULTURE, BLOOD (ROUTINE X 2)

## 2020-05-04 LAB — CULTURE, BLOOD (SINGLE): Culture: NO GROWTH

## 2020-05-05 ENCOUNTER — Telehealth: Payer: Self-pay | Admitting: Family Medicine

## 2020-05-05 ENCOUNTER — Telehealth: Payer: Self-pay

## 2020-05-05 ENCOUNTER — Encounter: Payer: Self-pay | Admitting: *Deleted

## 2020-05-05 ENCOUNTER — Other Ambulatory Visit: Payer: Self-pay | Admitting: *Deleted

## 2020-05-05 MED ORDER — CEPHALEXIN 500 MG PO CAPS: 500.0000 mg | ORAL_CAPSULE | Freq: Every day | ORAL | 0 refills | Status: DC

## 2020-05-05 NOTE — Telephone Encounter (Signed)
Left message for Cindy Burns that it is very important that she calls me back today.  We need to make sure she is being treated for her UTI.

## 2020-05-05 NOTE — Telephone Encounter (Signed)
Spoke with Cindy Burns.  She is under the impression that she was treated for the UTI through her IV at dialysis.

## 2020-05-05 NOTE — Telephone Encounter (Signed)
Ms. Cindy Burns notified as instructed by telephone.  Patient states understanding.

## 2020-05-05 NOTE — Patient Outreach (Signed)
[facility]) Care Management [facility] CM Telephone Outreach Post-hospital discharge day # 76 without unplanned hospital re-admission  05/05/2020  TONDA WIEDERHOLD May 15, 1946 [id number]  Successful telephone outreach to [name], 74 y/o female referred to [facility] CM byinpatient RN CMon Feb 17, 2020; patient was recently hospitalized April 3- Feb 19, 2020 foracute renal failure/ DKA/ acute respiratory failure and required initiation of hemodialysis while hospitalized.Patient has history including, but not limited to, breast cancer; DM; COPD; depression; HTN/ HLD; hepatomegaly; and tobacco use. Patient was discharged home to self-care with home health services through [place], which are now completed.    Noted through review of EHR that patient had recent ED visit 04/29/20 for fever; her PCP was subsequently contacted to report positive blood cultures and noted that antibiotics have now been called in to patient's outpatient pharmacy  HIPAA/ identity verified; patient reports "doingfine" and she sounds to be in no distress throughout phone call today. She denies clinical concerns and states that she is feeling better after her recent ED visit; confirms that she understands to pick up medications from outpatient pharmacy and to begin taking today; she verbalizes understanding and agreement; states she will go get antibiotics after we complete our call today.  -- has not missed any hemodialysis sessions; tolerating well; continues using transportation through medicaid benefit -- has recently decreased hemodialysis sessions from 3 times per week to 2 times per week: now attending on Tuesday's and Saturday's: remains hopeful that she will some day in the future she will be able to completely stop   -- has begun taking Januvia after last month's care coordination outreach around same -- denies medication concerns; medication review completed with patient today -- has not been regularly  monitoring/ recording blood sugars at home; "just doing it every now and then;" provided education around value of consistent monitoring of same at home; reviewed with patient her recently recorded blood sugars and she reports fasting ranges between 128-137; encouraged her to resume monitoring/ recording daily, fasting -- continues trying to follow diet for DM/ renal failure: "doing the best I can;" reports talks to dietician at hemodialysis center regularly  Patient denies further issues, concerns, or problems today. Iconfirmed that patient hasmy direct phone number, the main [facility] CM office phone number, and the [facility] CM 24-hour nurse advice phone number should issues arise prior to next scheduled [facility] CM outreach. Encouraged patient to contact me directly if needs, questions, issues, or concerns arise prior to next scheduled outreach; patient agreed to do so.  Plan:  Patient will take medications as prescribed and will attend all scheduled providerand hemodialysisappointments  Patient will promptly notify care providers for any new concerns/ issues/ problems that arise  Patient will continue monitoring/ recordingfasting morningdailyblood sugars  [facility] CM outreach to continue with scheduled phone callnext month, sooner if indicated  [name], RN, BSN, [employer] Coordinator [facility] Care Management  [phone]

## 2020-05-05 NOTE — Addendum Note (Signed)
Addended by: Eliezer Lofts E on: 05/05/2020 03:17 PM   Modules accepted: Orders

## 2020-05-05 NOTE — Progress Notes (Signed)
  Chronic Care Management   Outreach Note  05/05/2020 Name: Cindy Burns MRN: [id number] DOB: 10-May-1946  Referred by: [name], MD Reason for referral : No chief complaint on file.   An unsuccessful telephone outreach was attempted today. The patient was referred to the pharmacist for assistance with care management and care coordination.   Follow Up Plan:   Earney Hamburg Upstream Scheduler

## 2020-05-05 NOTE — Telephone Encounter (Signed)
Call patient. I will send in rx for the below antibiotics. cephalexin 500mg  daily (on dialysis days she should take her dose after dialysis - so maybe just tell her to take it at bedtime daily)  for 10 days

## 2020-05-05 NOTE — Telephone Encounter (Signed)
Please contact patient.. we need to verify if she started antibiotics for UTI and EColi in blood sample... noted at last ED visit. And get update on how she is doing.  ED pharmacist had contacted her but was unable to verify if she started antibiotics.  She 7/27/ phone note.

## 2020-05-05 NOTE — Telephone Encounter (Signed)
Spoke with [facility].  They state they have not treated her UTI.

## 2020-05-05 NOTE — Telephone Encounter (Signed)
+  BC  Contacted and PCP also contacted by Vena Austria

## 2020-05-06 DIAGNOSIS — Z992 Dependence on renal dialysis: Secondary | ICD-10-CM | POA: Diagnosis not present

## 2020-05-06 DIAGNOSIS — D509 Iron deficiency anemia, unspecified: Secondary | ICD-10-CM | POA: Diagnosis not present

## 2020-05-06 DIAGNOSIS — D649 Anemia, unspecified: Secondary | ICD-10-CM | POA: Diagnosis not present

## 2020-05-06 DIAGNOSIS — N170 Acute kidney failure with tubular necrosis: Secondary | ICD-10-CM | POA: Diagnosis not present

## 2020-05-06 DIAGNOSIS — D689 Coagulation defect, unspecified: Secondary | ICD-10-CM | POA: Diagnosis not present

## 2020-05-06 DIAGNOSIS — R52 Pain, unspecified: Secondary | ICD-10-CM | POA: Diagnosis not present

## 2020-05-06 DIAGNOSIS — T8249XA Other complication of vascular dialysis catheter, initial encounter: Secondary | ICD-10-CM | POA: Diagnosis not present

## 2020-05-06 DIAGNOSIS — N2581 Secondary hyperparathyroidism of renal origin: Secondary | ICD-10-CM | POA: Diagnosis not present

## 2020-05-06 DIAGNOSIS — D688 Other specified coagulation defects: Secondary | ICD-10-CM | POA: Diagnosis not present

## 2020-05-09 DIAGNOSIS — D688 Other specified coagulation defects: Secondary | ICD-10-CM | POA: Diagnosis not present

## 2020-05-09 DIAGNOSIS — N179 Acute kidney failure, unspecified: Secondary | ICD-10-CM | POA: Diagnosis not present

## 2020-05-09 DIAGNOSIS — Z992 Dependence on renal dialysis: Secondary | ICD-10-CM | POA: Diagnosis not present

## 2020-05-09 DIAGNOSIS — N2581 Secondary hyperparathyroidism of renal origin: Secondary | ICD-10-CM | POA: Diagnosis not present

## 2020-05-09 DIAGNOSIS — T8249XA Other complication of vascular dialysis catheter, initial encounter: Secondary | ICD-10-CM | POA: Diagnosis not present

## 2020-05-09 DIAGNOSIS — D689 Coagulation defect, unspecified: Secondary | ICD-10-CM | POA: Diagnosis not present

## 2020-05-09 DIAGNOSIS — N170 Acute kidney failure with tubular necrosis: Secondary | ICD-10-CM | POA: Diagnosis not present

## 2020-05-09 DIAGNOSIS — R52 Pain, unspecified: Secondary | ICD-10-CM | POA: Diagnosis not present

## 2020-05-09 DIAGNOSIS — Z23 Encounter for immunization: Secondary | ICD-10-CM | POA: Diagnosis not present

## 2020-05-09 DIAGNOSIS — D509 Iron deficiency anemia, unspecified: Secondary | ICD-10-CM | POA: Diagnosis not present

## 2020-05-09 DIAGNOSIS — D649 Anemia, unspecified: Secondary | ICD-10-CM | POA: Diagnosis not present

## 2020-05-13 DIAGNOSIS — Z992 Dependence on renal dialysis: Secondary | ICD-10-CM | POA: Diagnosis not present

## 2020-05-13 DIAGNOSIS — R52 Pain, unspecified: Secondary | ICD-10-CM | POA: Diagnosis not present

## 2020-05-13 DIAGNOSIS — N170 Acute kidney failure with tubular necrosis: Secondary | ICD-10-CM | POA: Diagnosis not present

## 2020-05-13 DIAGNOSIS — T8249XA Other complication of vascular dialysis catheter, initial encounter: Secondary | ICD-10-CM | POA: Diagnosis not present

## 2020-05-13 DIAGNOSIS — D688 Other specified coagulation defects: Secondary | ICD-10-CM | POA: Diagnosis not present

## 2020-05-13 DIAGNOSIS — D649 Anemia, unspecified: Secondary | ICD-10-CM | POA: Diagnosis not present

## 2020-05-13 DIAGNOSIS — N2581 Secondary hyperparathyroidism of renal origin: Secondary | ICD-10-CM | POA: Diagnosis not present

## 2020-05-13 DIAGNOSIS — Z23 Encounter for immunization: Secondary | ICD-10-CM | POA: Diagnosis not present

## 2020-05-13 DIAGNOSIS — D509 Iron deficiency anemia, unspecified: Secondary | ICD-10-CM | POA: Diagnosis not present

## 2020-05-13 DIAGNOSIS — D689 Coagulation defect, unspecified: Secondary | ICD-10-CM | POA: Diagnosis not present

## 2020-05-16 DIAGNOSIS — Z23 Encounter for immunization: Secondary | ICD-10-CM | POA: Diagnosis not present

## 2020-05-16 DIAGNOSIS — D688 Other specified coagulation defects: Secondary | ICD-10-CM | POA: Diagnosis not present

## 2020-05-16 DIAGNOSIS — N2581 Secondary hyperparathyroidism of renal origin: Secondary | ICD-10-CM | POA: Diagnosis not present

## 2020-05-16 DIAGNOSIS — N170 Acute kidney failure with tubular necrosis: Secondary | ICD-10-CM | POA: Diagnosis not present

## 2020-05-16 DIAGNOSIS — Z992 Dependence on renal dialysis: Secondary | ICD-10-CM | POA: Diagnosis not present

## 2020-05-16 DIAGNOSIS — T8249XA Other complication of vascular dialysis catheter, initial encounter: Secondary | ICD-10-CM | POA: Diagnosis not present

## 2020-05-16 DIAGNOSIS — D689 Coagulation defect, unspecified: Secondary | ICD-10-CM | POA: Diagnosis not present

## 2020-05-16 DIAGNOSIS — R52 Pain, unspecified: Secondary | ICD-10-CM | POA: Diagnosis not present

## 2020-05-16 DIAGNOSIS — D649 Anemia, unspecified: Secondary | ICD-10-CM | POA: Diagnosis not present

## 2020-05-16 DIAGNOSIS — D509 Iron deficiency anemia, unspecified: Secondary | ICD-10-CM | POA: Diagnosis not present

## 2020-05-17 DIAGNOSIS — N179 Acute kidney failure, unspecified: Secondary | ICD-10-CM | POA: Diagnosis not present

## 2020-05-20 DIAGNOSIS — T8249XA Other complication of vascular dialysis catheter, initial encounter: Secondary | ICD-10-CM | POA: Diagnosis not present

## 2020-05-20 DIAGNOSIS — N170 Acute kidney failure with tubular necrosis: Secondary | ICD-10-CM | POA: Diagnosis not present

## 2020-05-20 DIAGNOSIS — N2581 Secondary hyperparathyroidism of renal origin: Secondary | ICD-10-CM | POA: Diagnosis not present

## 2020-05-20 DIAGNOSIS — D689 Coagulation defect, unspecified: Secondary | ICD-10-CM | POA: Diagnosis not present

## 2020-05-20 DIAGNOSIS — Z23 Encounter for immunization: Secondary | ICD-10-CM | POA: Diagnosis not present

## 2020-05-20 DIAGNOSIS — Z992 Dependence on renal dialysis: Secondary | ICD-10-CM | POA: Diagnosis not present

## 2020-05-20 DIAGNOSIS — D688 Other specified coagulation defects: Secondary | ICD-10-CM | POA: Diagnosis not present

## 2020-05-20 DIAGNOSIS — D649 Anemia, unspecified: Secondary | ICD-10-CM | POA: Diagnosis not present

## 2020-05-20 DIAGNOSIS — D509 Iron deficiency anemia, unspecified: Secondary | ICD-10-CM | POA: Diagnosis not present

## 2020-05-20 DIAGNOSIS — R52 Pain, unspecified: Secondary | ICD-10-CM | POA: Diagnosis not present

## 2020-05-21 DIAGNOSIS — M25572 Pain in left ankle and joints of left foot: Secondary | ICD-10-CM | POA: Diagnosis not present

## 2020-05-21 DIAGNOSIS — J449 Chronic obstructive pulmonary disease, unspecified: Secondary | ICD-10-CM | POA: Diagnosis not present

## 2020-05-21 DIAGNOSIS — M25472 Effusion, left ankle: Secondary | ICD-10-CM | POA: Diagnosis not present

## 2020-05-22 NOTE — Assessment & Plan Note (Signed)
Discussed nutrition on dialysis.

## 2020-05-22 NOTE — Progress Notes (Signed)
Chief Complaint  Patient presents with  . Follow-up    Multiple issues    History of Present Illness: HPI    74 year old female presents for follow up multiple issues  1. ARF due to DKA, ATN .[name] now on dialysis,  Cannot afford Nepro Wt Readings from Last 3 Encounters:  03/10/20 111 lb 12 oz (50.7 kg)  02/25/20 117 lb 8 oz (53.3 kg)  02/19/20 130 lb (59 kg)  Body mass index is 20.77 kg/m.  Eating 3 meals a day... eating more protein.   2. MDD: back on venlafaxine She is doing better overall with mood.  3. DM, fasting blood sugars 134 this morning  Back on januvia 25 mg daily.[name] started 3 days ago. No SE.  4. HTN  Fluctuating BPs given dialysis BP Readings from Last 3 Encounters:  03/10/20 (!) 104/50  02/25/20 (!) 150/60  02/19/20 (!) 158/78    Ulcers on heels resolved!   PT is coming out to house    This visit occurred during the SARS-CoV-2 public health emergency.  Safety protocols were in place, including screening questions prior to the visit, additional usage of staff PPE, and extensive cleaning of exam room while observing appropriate contact time as indicated for disinfecting solutions.   COVID 19 screen:  No recent travel or known exposure to COVID19 The patient denies respiratory symptoms of COVID 19 at this time. The importance of social distancing was discussed today.     ROS   ROS: No  unusual headaches, no dysphagia.  No prolonged cough. No dyspnea or chest pain on exertion.  No abdominal pain, change in bowel habits, black or bloody stools.  No urinary tract symptoms.  No new or unusual musculoskeletal symptoms.  Normal menses, no abnormal vaginal bleeding, discharge or unexpected pelvic pain. No new breast lumps, breast pain or nipple discharge.  Past Medical History:  Diagnosis Date  . AKI (acute kidney injury) ([place]) 01/2020  . Cancer [facility]) 2005   Breast Cancer  . COPD (chronic obstructive pulmonary disease) ([place])   . Depression   . Diabetes mellitus  without complication ([place])   . Hyperlipidemia     reports that she has quit smoking. Her smoking use included cigarettes. She has a 40.00 pack-year smoking history. She has never used smokeless tobacco. She reports that she does not drink alcohol or use drugs.   Current Outpatient Medications:  .  albuterol (VENTOLIN HFA) 108 (90 Base) MCG/ACT inhaler, Inhale 1-2 puffs into the lungs every 4 (four) hours as needed for wheezing or shortness of breath., Disp: 6.7 g, Rfl: 0 .  amLODipine (NORVASC) 10 MG tablet, Take 1 tablet (10 mg total) by mouth daily., Disp: 30 tablet, Rfl: 0 .  fluticasone furoate-vilanterol (BREO ELLIPTA) 100-25 MCG/INH AEPB, Inhale 1 puff into the lungs daily., Disp: 30 each, Rfl: 0 .  furosemide (LASIX) 40 MG tablet, Take 1 tablet (40 mg total) by mouth 2 (two) times daily., Disp: 60 tablet, Rfl: 0 .  ipratropium-albuterol (DUONEB) 0.5-2.5 (3) MG/3ML SOLN, Take 3 mLs by nebulization every 6 (six) hours as needed., Disp: 360 mL, Rfl: 0 .  JANUVIA 100 MG tablet, TAKE 1 TABLET BY MOUTH DAILY, Disp: 90 tablet, Rfl: 0 .  metoprolol tartrate (LOPRESSOR) 25 MG tablet, Take 1 tablet (25 mg total) by mouth 2 (two) times daily., Disp: 60 tablet, Rfl: 0 .  nicotine (NICODERM CQ - DOSED IN MG/24 HOURS) 21 mg/24hr patch, Place 1 patch (21 mg total) onto the  skin daily., Disp: 30 patch, Rfl: 0 .  Nutritional Supplements (FEEDING SUPPLEMENT, NEPRO CARB STEADY,) LIQD, Take 237 mLs by mouth 3 (three) times daily between meals., Disp: 21330 mL, Rfl: 0 .  VELPHORO 500 MG chewable tablet, Chew 500 mg by mouth 3 (three) times daily., Disp: , Rfl:  .  venlafaxine XR (EFFEXOR-XR) 37.5 MG 24 hr capsule, Take 1 capsule (37.5 mg total) by mouth daily with breakfast., Disp: 30 capsule, Rfl: 5   Observations/Objective: Blood pressure (!) 104/50, pulse 67, temperature 98.5 F (36.9 C), temperature source Temporal, height 5' 1.5" (1.562 m), weight 111 lb 12 oz (50.7 kg), SpO2 97 %.  Physical Exam  BP  (!) 104/50   Pulse 67   Temp 98.5 F (36.9 C) (Temporal)   Ht 5' 1.5" (1.562 m)   Wt 111 lb 12 oz (50.7 kg)   SpO2 97%   BMI 20.77 kg/m   General Appearance:    Alert, cooperative, no distress, appears stated age  Head:    Normocephalic, without obvious abnormality, atraumatic  Eyes:    PERRL, conjunctiva/corneas clear, EOM's intact, fundi    benign, both eyes  Ears:    Normal TM's and external ear canals, both ears  Nose:   Nares normal, septum midline, mucosa normal, no drainage    or sinus tenderness  Throat:   Lips, mucosa, and tongue normal; teeth and gums normal  Neck:   Supple, symmetrical, trachea midline, no adenopathy;    thyroid:  no enlargement/tenderness/nodules; no carotid   bruit or JVD  Back:     Symmetric, no curvature, ROM normal, no CVA tenderness  Lungs:     Clear to auscultation bilaterally, respirations unlabored  Chest Wall:    No tenderness or deformity   Heart:    Regular rate and rhythm, S1 and S2 normal, no murmur, rub   or gallop  Breast Exam:    No tenderness, masses, or nipple abnormality  Abdomen:     Soft, non-tender, bowel sounds active all four quadrants,    no masses, no organomegaly        Extremities:   Extremities normal, atraumatic, no cyanosis or edema  Pulses:   2+ and symmetric all extremities  Skin:   Skin color, texture, turgor normal, no rashes or lesions  Lymph nodes:   Cervical, supraclavicular, and axillary nodes normal  Neurologic:   CNII-XII intact, normal strength, sensation and reflexes    throughout   Assessment and Plan   Major depressive disorder, recurrent episode, moderate Improved control back on venlafaxine.  Hypertension associated with diabetes ([place]) Fluctuating with dialysis. Continue currents meds.  ESRD (end stage renal disease) ([place]) Discussed nutrition on dialysis.     Eliezer Lofts, MD

## 2020-05-23 DIAGNOSIS — Z23 Encounter for immunization: Secondary | ICD-10-CM | POA: Diagnosis not present

## 2020-05-23 DIAGNOSIS — T8249XA Other complication of vascular dialysis catheter, initial encounter: Secondary | ICD-10-CM | POA: Diagnosis not present

## 2020-05-23 DIAGNOSIS — D689 Coagulation defect, unspecified: Secondary | ICD-10-CM | POA: Diagnosis not present

## 2020-05-23 DIAGNOSIS — D649 Anemia, unspecified: Secondary | ICD-10-CM | POA: Diagnosis not present

## 2020-05-23 DIAGNOSIS — D509 Iron deficiency anemia, unspecified: Secondary | ICD-10-CM | POA: Diagnosis not present

## 2020-05-23 DIAGNOSIS — N2581 Secondary hyperparathyroidism of renal origin: Secondary | ICD-10-CM | POA: Diagnosis not present

## 2020-05-23 DIAGNOSIS — Z992 Dependence on renal dialysis: Secondary | ICD-10-CM | POA: Diagnosis not present

## 2020-05-23 DIAGNOSIS — N179 Acute kidney failure, unspecified: Secondary | ICD-10-CM | POA: Diagnosis not present

## 2020-05-23 DIAGNOSIS — D688 Other specified coagulation defects: Secondary | ICD-10-CM | POA: Diagnosis not present

## 2020-05-23 DIAGNOSIS — R52 Pain, unspecified: Secondary | ICD-10-CM | POA: Diagnosis not present

## 2020-05-23 DIAGNOSIS — N170 Acute kidney failure with tubular necrosis: Secondary | ICD-10-CM | POA: Diagnosis not present

## 2020-05-27 DIAGNOSIS — D688 Other specified coagulation defects: Secondary | ICD-10-CM | POA: Diagnosis not present

## 2020-05-27 DIAGNOSIS — N2581 Secondary hyperparathyroidism of renal origin: Secondary | ICD-10-CM | POA: Diagnosis not present

## 2020-05-27 DIAGNOSIS — D649 Anemia, unspecified: Secondary | ICD-10-CM | POA: Diagnosis not present

## 2020-05-27 DIAGNOSIS — D509 Iron deficiency anemia, unspecified: Secondary | ICD-10-CM | POA: Diagnosis not present

## 2020-05-27 DIAGNOSIS — T8249XA Other complication of vascular dialysis catheter, initial encounter: Secondary | ICD-10-CM | POA: Diagnosis not present

## 2020-05-27 DIAGNOSIS — Z23 Encounter for immunization: Secondary | ICD-10-CM | POA: Diagnosis not present

## 2020-05-27 DIAGNOSIS — D689 Coagulation defect, unspecified: Secondary | ICD-10-CM | POA: Diagnosis not present

## 2020-05-27 DIAGNOSIS — Z992 Dependence on renal dialysis: Secondary | ICD-10-CM | POA: Diagnosis not present

## 2020-05-27 DIAGNOSIS — N170 Acute kidney failure with tubular necrosis: Secondary | ICD-10-CM | POA: Diagnosis not present

## 2020-05-27 DIAGNOSIS — R52 Pain, unspecified: Secondary | ICD-10-CM | POA: Diagnosis not present

## 2020-05-30 DIAGNOSIS — R52 Pain, unspecified: Secondary | ICD-10-CM | POA: Diagnosis not present

## 2020-05-30 DIAGNOSIS — D689 Coagulation defect, unspecified: Secondary | ICD-10-CM | POA: Diagnosis not present

## 2020-05-30 DIAGNOSIS — D688 Other specified coagulation defects: Secondary | ICD-10-CM | POA: Diagnosis not present

## 2020-05-30 DIAGNOSIS — D649 Anemia, unspecified: Secondary | ICD-10-CM | POA: Diagnosis not present

## 2020-05-30 DIAGNOSIS — Z992 Dependence on renal dialysis: Secondary | ICD-10-CM | POA: Diagnosis not present

## 2020-05-30 DIAGNOSIS — N170 Acute kidney failure with tubular necrosis: Secondary | ICD-10-CM | POA: Diagnosis not present

## 2020-05-30 DIAGNOSIS — D509 Iron deficiency anemia, unspecified: Secondary | ICD-10-CM | POA: Diagnosis not present

## 2020-05-30 DIAGNOSIS — Z23 Encounter for immunization: Secondary | ICD-10-CM | POA: Diagnosis not present

## 2020-05-30 DIAGNOSIS — N2581 Secondary hyperparathyroidism of renal origin: Secondary | ICD-10-CM | POA: Diagnosis not present

## 2020-05-30 DIAGNOSIS — T8249XA Other complication of vascular dialysis catheter, initial encounter: Secondary | ICD-10-CM | POA: Diagnosis not present

## 2020-05-31 ENCOUNTER — Encounter: Payer: Self-pay | Admitting: *Deleted

## 2020-05-31 ENCOUNTER — Other Ambulatory Visit: Payer: Self-pay | Admitting: *Deleted

## 2020-05-31 NOTE — Patient Outreach (Signed)
[facility]) Care Management [place] Telephone Outreach  05/31/2020  [name] [birth year] [id number]  Unsuccessful telephone outreach to Cindy Burns, 74 y/o female referred to [facility] CM byinpatient RN CMon Feb 17, 2020; patient was recently hospitalized April 3- Feb 19, 2020 foracute renal failure/ DKA/ acute respiratory failure and required initiation of hemodialysis while hospitalized.Patient has history including, but not limited to, breast cancer; DM; COPD; depression; HTN/ HLD; hepatomegaly; and tobacco use. Patient was discharged home to self-care with home health services through [place], which are now completed.   Noted through review of EHR that patient had recent ED visit 04/29/20 for fever; her PCP was subsequently contacted to report positive blood cultures and was prescribed antibiotics.  HIPAA compliant voice mail message left for patient, requesting return call back.  Plan:  Will re-attempt [facility] CM telephone outreach within 4 business days if I do not hear back from patient first  [name], RN, BSN, [employer] Coordinator [facility] Care Management  [phone]

## 2020-06-02 ENCOUNTER — Other Ambulatory Visit: Payer: Self-pay | Admitting: *Deleted

## 2020-06-02 ENCOUNTER — Encounter: Payer: Self-pay | Admitting: *Deleted

## 2020-06-02 NOTE — Patient Outreach (Signed)
[facility]) Care Management [place] Telephone Outreach Post-hospital discharge day # 104  06/02/2020  Cindy Burns 09/30/1946 [id number]  Successful telephone outreach to Cindy Burns, 74 y/o female referred to [facility] CM byinpatient RN CMon Feb 17, 2020; after hospitalization April 3- Feb 19, 2020 foracute renal failure/ DKA/ acute respiratory failure and required initiation of hemodialysis while hospitalized.Patient has history including, but not limited to, breast cancer; DM; COPD; depression; HTN/ HLD; hepatomegaly; and tobacco use. Patient was discharged home to self-care with home health services through [place], which are now completed.   HIPAA/ identity verified; patient reports "doinggood" and she sounds to be in no distress throughout phone call today. She denies clinical concerns and states that she has been doing "fine" since our last conversation.  She reports a recent fall without injury while trying to re-arrange furniture in her home; states a friend assisted her in getting up from fall.  Fall prevention discussed with patient who reports ongoing intermittent leg weakness; states she has been using her cane.  -- has not missed any hemodialysis sessions; tolerating well; continues using transportation through medicaid benefit -- hemodialysis sessions continue 2 times per week: now attending on Tuesday's and Saturday's: remains hopeful that she will some day in the future she will be able to completely stop   -- no changes or concerns around medications: continues independently self-managing -- has resumed monitoring and recording fasting blood sugars at home, reports ranges between 129-142, with this mornings value of "129;" denies extreme values for low/ high readings -- discussed significance of A1-C values; reviewed same with patient; encouraged her to ensure that updated A1-C value is obtained at time of upcoming PCP office visit 06/16/20 -- patient  continues to drive self to most appointments; continues using [employer] for transportation to dialysis -- continues trying to follow diet for DM/ renal failure: "doing the best I can;" reports talks to dietician at hemodialysis center regularly; reports occasional indiscretion, but feels that she overall "eats healthy." -- reviewed upcoming provider appointments; coached patient around talking points for scheduled PCP office visit 06/16/20  Patient denies further issues, concerns, or problems today. Iconfirmed that patient hasmy direct phone number, the main [facility] CM office phone number, and the [facility] CM 24-hour nurse advice phone number should issues arise prior to next scheduled [facility] CM outreach. Encouraged patient to contact me directly if needs, questions, issues, or concerns arise prior to next scheduled outreach; patient agreed to do so.  Plan:  Patient will take medications as prescribed and will attend all scheduled providerand hemodialysisappointments  Patient will promptly notify care providers for any new concerns/ issues/ problems that arise  Patient will continue monitoring/ recordingfasting morningdailyblood sugars  [facility]N CM outreach to continue with scheduled phone callnext month, sooner if indicated

## 2020-06-03 DIAGNOSIS — D509 Iron deficiency anemia, unspecified: Secondary | ICD-10-CM | POA: Diagnosis not present

## 2020-06-03 DIAGNOSIS — N170 Acute kidney failure with tubular necrosis: Secondary | ICD-10-CM | POA: Diagnosis not present

## 2020-06-03 DIAGNOSIS — R52 Pain, unspecified: Secondary | ICD-10-CM | POA: Diagnosis not present

## 2020-06-03 DIAGNOSIS — T8249XA Other complication of vascular dialysis catheter, initial encounter: Secondary | ICD-10-CM | POA: Diagnosis not present

## 2020-06-03 DIAGNOSIS — Z992 Dependence on renal dialysis: Secondary | ICD-10-CM | POA: Diagnosis not present

## 2020-06-03 DIAGNOSIS — D689 Coagulation defect, unspecified: Secondary | ICD-10-CM | POA: Diagnosis not present

## 2020-06-03 DIAGNOSIS — D649 Anemia, unspecified: Secondary | ICD-10-CM | POA: Diagnosis not present

## 2020-06-03 DIAGNOSIS — Z23 Encounter for immunization: Secondary | ICD-10-CM | POA: Diagnosis not present

## 2020-06-03 DIAGNOSIS — D688 Other specified coagulation defects: Secondary | ICD-10-CM | POA: Diagnosis not present

## 2020-06-03 DIAGNOSIS — N2581 Secondary hyperparathyroidism of renal origin: Secondary | ICD-10-CM | POA: Diagnosis not present

## 2020-06-06 DIAGNOSIS — D649 Anemia, unspecified: Secondary | ICD-10-CM | POA: Diagnosis not present

## 2020-06-06 DIAGNOSIS — R52 Pain, unspecified: Secondary | ICD-10-CM | POA: Diagnosis not present

## 2020-06-06 DIAGNOSIS — Z992 Dependence on renal dialysis: Secondary | ICD-10-CM | POA: Diagnosis not present

## 2020-06-06 DIAGNOSIS — N170 Acute kidney failure with tubular necrosis: Secondary | ICD-10-CM | POA: Diagnosis not present

## 2020-06-06 DIAGNOSIS — Z23 Encounter for immunization: Secondary | ICD-10-CM | POA: Diagnosis not present

## 2020-06-06 DIAGNOSIS — D688 Other specified coagulation defects: Secondary | ICD-10-CM | POA: Diagnosis not present

## 2020-06-06 DIAGNOSIS — D689 Coagulation defect, unspecified: Secondary | ICD-10-CM | POA: Diagnosis not present

## 2020-06-06 DIAGNOSIS — T8249XA Other complication of vascular dialysis catheter, initial encounter: Secondary | ICD-10-CM | POA: Diagnosis not present

## 2020-06-06 DIAGNOSIS — D509 Iron deficiency anemia, unspecified: Secondary | ICD-10-CM | POA: Diagnosis not present

## 2020-06-06 DIAGNOSIS — N2581 Secondary hyperparathyroidism of renal origin: Secondary | ICD-10-CM | POA: Diagnosis not present

## 2020-06-08 ENCOUNTER — Telehealth: Payer: Self-pay | Admitting: Family Medicine

## 2020-06-08 DIAGNOSIS — D649 Anemia, unspecified: Secondary | ICD-10-CM

## 2020-06-08 DIAGNOSIS — E0842 Diabetes mellitus due to underlying condition with diabetic polyneuropathy: Secondary | ICD-10-CM

## 2020-06-08 DIAGNOSIS — E538 Deficiency of other specified B group vitamins: Secondary | ICD-10-CM

## 2020-06-08 NOTE — Telephone Encounter (Signed)
-----   Message from Cloyd Stagers, RT sent at 05/22/2020 10:49 AM EDT ----- Regarding: Lab Orders for Friday 9.3.2021 Please place lab orders for Friday 9.3.2021, office visit for physical on Friday 9.10.2021 Thank you, Dyke Maes RT(R)

## 2020-06-09 ENCOUNTER — Other Ambulatory Visit: Payer: Self-pay

## 2020-06-09 ENCOUNTER — Other Ambulatory Visit: Payer: Medicare Other

## 2020-06-10 DIAGNOSIS — N2581 Secondary hyperparathyroidism of renal origin: Secondary | ICD-10-CM | POA: Diagnosis not present

## 2020-06-10 DIAGNOSIS — D688 Other specified coagulation defects: Secondary | ICD-10-CM | POA: Diagnosis not present

## 2020-06-10 DIAGNOSIS — D689 Coagulation defect, unspecified: Secondary | ICD-10-CM | POA: Diagnosis not present

## 2020-06-10 DIAGNOSIS — D649 Anemia, unspecified: Secondary | ICD-10-CM | POA: Diagnosis not present

## 2020-06-10 DIAGNOSIS — Z992 Dependence on renal dialysis: Secondary | ICD-10-CM | POA: Diagnosis not present

## 2020-06-10 DIAGNOSIS — T8249XA Other complication of vascular dialysis catheter, initial encounter: Secondary | ICD-10-CM | POA: Diagnosis not present

## 2020-06-10 DIAGNOSIS — N170 Acute kidney failure with tubular necrosis: Secondary | ICD-10-CM | POA: Diagnosis not present

## 2020-06-13 DIAGNOSIS — N2581 Secondary hyperparathyroidism of renal origin: Secondary | ICD-10-CM | POA: Diagnosis not present

## 2020-06-13 DIAGNOSIS — N170 Acute kidney failure with tubular necrosis: Secondary | ICD-10-CM | POA: Diagnosis not present

## 2020-06-13 DIAGNOSIS — Z992 Dependence on renal dialysis: Secondary | ICD-10-CM | POA: Diagnosis not present

## 2020-06-13 DIAGNOSIS — D649 Anemia, unspecified: Secondary | ICD-10-CM | POA: Diagnosis not present

## 2020-06-13 DIAGNOSIS — D688 Other specified coagulation defects: Secondary | ICD-10-CM | POA: Diagnosis not present

## 2020-06-13 DIAGNOSIS — D689 Coagulation defect, unspecified: Secondary | ICD-10-CM | POA: Diagnosis not present

## 2020-06-13 DIAGNOSIS — T8249XA Other complication of vascular dialysis catheter, initial encounter: Secondary | ICD-10-CM | POA: Diagnosis not present

## 2020-06-14 ENCOUNTER — Other Ambulatory Visit: Payer: Self-pay | Admitting: *Deleted

## 2020-06-14 NOTE — Patient Outreach (Signed)
[facility]) Care Management [place] Telephone Outreach  06/14/2020  Cindy Burns 03/08/1946 [id number]  Successful incoming telephone outreach from Cindy Burns, 74 y/o female referred to [place] byinpatient RN CMon Feb 17, 2020; after hospitalization April 3- Feb 19, 2020 foracute renal failure/ DKA/ acute respiratory failure and required initiation of hemodialysis while hospitalized.Patient has history including, but not limited to, breast cancer; DM; COPD; depression; HTN/ HLD; hepatomegaly; and tobacco use. Patient was discharged home to self-care with home health services through [place], which are now completed.  HIPAA/ identity verified; patient reports "not doingtoo good" and she reports that she is upset around news she received at hemodialysis yesterday; reports the doctor there told her that she needs to go back to having hemodialysis three times per week and that she may need kidney transplant.  Patient is understandably upset, as she was hopeful based on previous reports from her doctors, that she may one day completely stop having hemodialysis.  Patient states that she just wanted to share this information with me.  Emotional support provided throughout call; encouraged patient to begin list of questions to discuss with both her PCP at time of this Friday's scheduled appointment, as well as to discuss with her renal specialist.  She verbalizes appreciation for our conversation today.  Patient denies further issues, concerns, or problems today. Iconfirmed that patient hasmy direct phone number, the main [facility] CM office phone number, and the [facility] CM 24-hour nurse advice phone number should issues arise prior to next scheduled [facility] CM outreach. Encouraged patient to contact me directly if needs, questions, issues, or concerns arise prior to next scheduled outreach; patient agreed to do so.  Plan:  Patient will take medications as prescribed and will attend  all scheduled providerand hemodialysisappointments  Patient will promptly notify care providers for any new concerns/ issues/ problems that arise  Patient will continue monitoring/ recordingfasting morningdailyblood sugars  [facility]N CM outreach to continue with scheduled phone callas scheduled for later this month, sooner if indicated  Cindy Rack, RN, BSN, [employer] Coordinator [facility] Care Management  [phone]

## 2020-06-16 ENCOUNTER — Encounter: Payer: Self-pay | Admitting: Family Medicine

## 2020-06-16 ENCOUNTER — Ambulatory Visit (INDEPENDENT_AMBULATORY_CARE_PROVIDER_SITE_OTHER): Payer: Medicare Other | Admitting: Family Medicine

## 2020-06-16 ENCOUNTER — Other Ambulatory Visit: Payer: Self-pay

## 2020-06-16 VITALS — BP 160/75 | HR 85 | Temp 98.5°F | Ht 61.5 in | Wt 120.0 lb

## 2020-06-16 DIAGNOSIS — I10 Essential (primary) hypertension: Secondary | ICD-10-CM

## 2020-06-16 DIAGNOSIS — Z23 Encounter for immunization: Secondary | ICD-10-CM | POA: Diagnosis not present

## 2020-06-16 DIAGNOSIS — I152 Hypertension secondary to endocrine disorders: Secondary | ICD-10-CM

## 2020-06-16 DIAGNOSIS — E1159 Type 2 diabetes mellitus with other circulatory complications: Secondary | ICD-10-CM

## 2020-06-16 DIAGNOSIS — E1143 Type 2 diabetes mellitus with diabetic autonomic (poly)neuropathy: Secondary | ICD-10-CM

## 2020-06-16 DIAGNOSIS — N186 End stage renal disease: Secondary | ICD-10-CM | POA: Diagnosis not present

## 2020-06-16 DIAGNOSIS — E1169 Type 2 diabetes mellitus with other specified complication: Secondary | ICD-10-CM

## 2020-06-16 DIAGNOSIS — E785 Hyperlipidemia, unspecified: Secondary | ICD-10-CM | POA: Diagnosis not present

## 2020-06-16 DIAGNOSIS — E0842 Diabetes mellitus due to underlying condition with diabetic polyneuropathy: Secondary | ICD-10-CM | POA: Diagnosis not present

## 2020-06-16 LAB — HM DIABETES FOOT EXAM

## 2020-06-16 MED ORDER — GABAPENTIN 100 MG PO CAPS: 100.0000 mg | ORAL_CAPSULE | Freq: Every day | ORAL | 3 refills | Status: DC

## 2020-06-16 NOTE — Chronic Care Management (AMB) (Deleted)
Chronic Care Management Pharmacy  Name: Cindy Burns  MRN: [id number] DOB: 08-29-1946   Chief Complaint/ HPI  Cindy Burns,  74 y.o. , female presents for their Initial CCM visit with the clinical pharmacist via telephone due to COVID-19 Pandemic.  PCP : Cindy Sanders, MD  Their chronic conditions include: hypertension, diabetes, hyperlipidemia, COPD, depression.   Office Visits:***  Consult Visit:***  Medications: Outpatient Encounter Medications as of 06/19/2020  Medication Sig Note  . albuterol (VENTOLIN HFA) 108 (90 Base) MCG/ACT inhaler TAKE 2 PUFFS BY MOUTH EVERY 6 HOURS AS NEEDED FOR WHEEZE OR SHORTNESS OF BREATH (Patient taking differently: Inhale 2 puffs into the lungs every 6 (six) hours as needed. )   . amLODipine (NORVASC) 10 MG tablet Take 1 tablet (10 mg total) by mouth daily. (Patient not taking: Reported on 04/29/2020)   . cephALEXin (KEFLEX) 500 MG capsule Take 1 capsule (500 mg total) by mouth at bedtime. (Patient not taking: Reported on 05/05/2020) 05/05/2020: Reports going to pick up this afternoon  . furosemide (LASIX) 40 MG tablet Take 1 tablet (40 mg total) by mouth 2 (two) times daily. (Patient not taking: Reported on 04/29/2020)   . ipratropium-albuterol (DUONEB) 0.5-2.5 (3) MG/3ML SOLN Take 3 mLs by nebulization every 6 (six) hours as needed. (Patient taking differently: Take 3 mLs by nebulization every 6 (six) hours as needed (for wheezing). )   . JANUVIA 100 MG tablet TAKE 1 TABLET BY MOUTH DAILY (Patient taking differently: Take 100 mg by mouth daily. )   . metoprolol tartrate (LOPRESSOR) 25 MG tablet Take 1 tablet (25 mg total) by mouth 2 (two) times daily. (Patient not taking: Reported on 04/29/2020)   . rosuvastatin (CRESTOR) 20 MG tablet TAKE 1 TABLET BY MOUTH EVERY DAY 05/05/2020: Reports taking as prescribed  . venlafaxine XR (EFFEXOR-XR) 37.5 MG 24 hr capsule TAKE 1 CAPSULE BY MOUTH DAILY WITH BREAKFAST. 05/05/2020: Reports taking as prescribed   No  facility-administered encounter medications on file as of 06/19/2020.   No Known Allergies  SDOH Screenings   Alcohol Screen: Low Risk   . Last Alcohol Screening Score (AUDIT): 0  Depression (PHQ2-9): Medium Risk  . PHQ-2 Score: 9  Financial Resource Strain: Low Risk   . Difficulty of Paying Living Expenses: Not very hard  Food Insecurity: No Food Insecurity  . Worried About Charity fundraiser in the Last Year: Never true  . Ran Out of Food in the Last Year: Never true  Housing: Low Risk   . Last Housing Risk Score: 0  Physical Activity: Inactive  . Days of Exercise per Week: 0 days  . Minutes of Exercise per Session: 0 min  Social Connections: Moderately Isolated  . Frequency of Communication with Friends and Family: More than three times a week  . Frequency of Social Gatherings with Friends and Family: More than three times a week  . Attends Religious Services: More than 4 times per year  . Active Member of Clubs or Organizations: No  . Attends Archivist Meetings: Never  . Marital Status: Divorced  Stress: No Stress Concern Present  . Feeling of Stress : Only a little  Tobacco Use: Medium Risk  . Smoking Tobacco Use: Former Smoker  . Smokeless Tobacco Use: Never Used  Transportation Needs: No Transportation Needs  . Lack of Transportation (Medical): No  . Lack of Transportation (Non-Medical): No     Current Diagnosis/Assessment:  Goals Addressed   None  COPD / Asthma / Tobacco   Last spirometry score: ***  Gold Grade: {CHL HP Upstream Pharm COPD Gold BTDVV:6160737106} Current COPD Classification:  {CHL HP Upstream Pharm COPD Classification:[phone]}  Eosinophil count:   Lab Results  Component Value Date/Time   EOSPCT 0 04/29/2020 06:00 PM  %                               Eos (Absolute):  Lab Results  Component Value Date/Time   EOSABS 0.0 04/29/2020 06:00 PM    Tobacco Status:  Social History   Tobacco Use  Smoking Status Former  Smoker  . Packs/day: 1.00  . Years: 40.00  . Pack years: 40.00  . Types: Cigarettes  Smokeless Tobacco Never Used    Patient has failed these meds in past: Symbicort, Atrovent inhaler, Spriva Patient is currently {CHL Controlled/Uncontrolled:[phone]} on the following medications: ***  Albuterol inhaler 2 puffs every 6 hours prn wheeze or shortness of breath  Breo ellipta 100-25 mcg/inh 1 puff daily  Duoneb 0.5-2.5 mg/66m solution 3 mls every 6 hours prn  Using maintenance inhaler regularly? {yes/no:20286} Frequency of rescue inhaler use:  {CHL HP Upstream Pharm Inhaler FYIRS:8546270350} We discussed:  {CHL HP Upstream Pharmacy discussion:[phone]}  Plan  Continue {CHL HP Upstream Pharmacy Plans:[phone]} ,  Diabetes   Recent Relevant Labs: Lab Results  Component Value Date/Time   HGBA1C 8.5 (H) 01/08/2020 11:45 PM   HGBA1C 6.8 (H) 07/29/2019 09:48 AM   MICROALBUR <0.7 01/13/2018 07:59 AM   MICROALBUR <0.7 03/31/2017 11:00 AM    Kidney Function Lab Results  Component Value Date/Time   CREATININE 2.27 (H) 04/29/2020 06:00 PM   CREATININE 2.91 (H) 02/19/2020 04:11 AM   GFR 60.18 07/29/2019 09:48 AM   GFRNONAA 21 (L) 04/29/2020 06:00 PM   GFRAA 24 (L) 04/29/2020 06:00 PM   K 3.8 04/29/2020 06:00 PM   K 3.8 02/19/2020 04:11 AM     Checking BG: {CHL HP Blood Glucose Monitoring Frequency:[phone]}  Recent FBG Readings: Recent pre-meal BG readings: *** Recent 2hr PP BG readings:  *** Recent HS BG readings: *** Patient has failed these meds in past: glipizide, metformin Patient is currently {CHL Controlled/Uncontrolled:[phone]} on the following medications: ***  Januvia 100 mg daily  Last diabetic Foot exam:  Lab Results  Component Value Date/Time   HMDIABEYEEXA No Retinopathy 12/24/2016 02:06 PM    Last diabetic Eye exam:  Lab Results  Component Value Date/Time   HMDIABFOOTEX done 04/06/2019 12:00 AM     We discussed: {CHL HP Upstream Pharmacy  discussion:[phone]}  Plan  Continue {CHL HP Upstream Pharmacy Plans:[phone]} and  Hypertension   BP today is:  {CHL HP UPSTREAM Pharmacist BP ranges:[phone]}  Office blood pressures are  BP Readings from Last 3 Encounters:  04/30/20 (!) 138/53  03/10/20 (!) 104/50  02/25/20 (!) 150/60    Patient has failed these meds in the past: ***losartan Patient is currently controlled/uncontrolled*** on the following medications: ***  Amlodipine 10 mg daily ***  Furosemide 40 mg bid ***  Metoprolol tartrate 25 mg bid ***   Patient checks BP at home {CHL HP BP Monitoring Frequency:[phone]}  Patient home BP readings are ranging: ***  We discussed {CHL HP Upstream Pharmacy discussion:[phone]}  Plan  Continue {CHL HP Upstream Pharmacy Plans:[phone]}   Hyperlipidemia   LDL goal < ***  Lipid Panel     Component Value Date/Time   CHOL 115 07/29/2019 0948  TRIG 142.0 07/29/2019 0948   HDL 36.40 (L) 07/29/2019 0948   LDLCALC 50 07/29/2019 0948   LDLDIRECT 163.0 12/11/2018 0924    Hepatic Function Latest Ref Rng & Units 04/29/2020 02/19/2020 02/18/2020  Total Protein 6.5 - 8.1 g/dL 5.4(L) - -  Albumin 3.5 - 5.0 g/dL 2.4(L) 3.2(L) 3.0(L)  AST 15 - 41 U/L 209(H) - -  ALT 0 - 44 U/L 119(H) - -  Alk Phosphatase 38 - 126 U/L 68 - -  Total Bilirubin 0.3 - 1.2 mg/dL 0.6 - -  Bilirubin, Direct 0.0 - 0.3 mg/dL - - -     The ASCVD Risk score Mikey Bussing DC Jr., et al., 2013) failed to calculate for the following reasons:   The valid total cholesterol range is 130 to 320 mg/dL   Patient has failed these meds in past: atorvastatin, zetia Patient is currently {CHL Controlled/[phone]} on the following medications:  . Crestor 20 mg daily  We discussed:  {CHL HP Upstream Pharmacy discussion:8703480205}  Plan  Continue {CHL HP Upstream Pharmacy VOZDG:6440347425}  Depression   Patient has failed these meds in past: bupropion, trazodone,  Patient is  currently {CHL Controlled/[phone]} on the following medications:  . Venlafaxine XR 27.5 mg daily ***  We discussed:  ***  Plan  Continue {CHL HP Upstream Pharmacy Plans:[phone]}   Osteopenia    Last DEXA Scan: 05/2014  T-Score femoral neck: -1.2  T-Score lumbar spine: 0.2  10-year probability of major osteoporotic fracture: 15.8%  10-year probability of hip fracture: 1.5%  No results found for: VD25OH   Patient is not a candidate for pharmacologic treatment  Patient has failed these meds in past: *** Patient is currently {CHL Controlled/[phone]} on the following medications:  . ***  We discussed:  Recommend [phone] units of vitamin D daily. Recommend 1200 mg of calcium daily from dietary and supplemental sources. Recommend weight-bearing and muscle strengthening exercises for building and maintaining bone density.  Plan  Continue {CHL HP Upstream Pharmacy Plans:[phone]}  Health Maintenance   Patient is currently {CHL Controlled/[phone]} on the following medications:  . ***  We discussed:  ***  Plan  Continue {CHL HP Upstream Pharmacy ZDGLO:7564332951}  Vaccines   Reviewed and discussed patient's vaccination history.    Immunization History  Administered Date(s) Administered  . Fluad Quad(high Dose 65+) 07/30/2019  . Influenza, High Dose Seasonal PF 07/24/2017, 06/21/2018  . Influenza,inj,Quad PF,6+ Mos 08/12/2013, 08/05/2014, 08/18/2015, 06/27/2016  . Pneumococcal Conjugate-13 04/22/2014, 06/21/2018  . Pneumococcal Polysaccharide-23 08/18/2015  . Td 10/07/1997, 02/28/2009  . Tdap 08/08/2014    Plan  Recommended patient receive *** vaccine in *** office.   Medication Management   Pt [facility] medications Uses pill box? {Yes or If no, why not?:20788} Pt endorses ***% compliance  We discussed: ***  Plan  {US Pharmacy OACZ:66063}    Follow up: *** month phone  visit  ***

## 2020-06-16 NOTE — Patient Instructions (Addendum)
Please stop at the lab to have labs drawn. Restart gabapentin but at low dose at bedtime.

## 2020-06-16 NOTE — Progress Notes (Signed)
Chief Complaint  Patient presents with  . Diabetes    History of Present Illness: HPI    74 year old female with ESRD on dialysis presents for follow up on DM.  She has been having pain in bilateral upper and  lower legs... ever since starting on dialysis. Low back pain after falling few weeks ago. She noted inability to lift toes  On left side... gradually improved since is in hospital. History of neuropathy.. she is no longer on gabapentin.  Diabetes:   Due for re-eval. On januvia Using medications without difficulties: Hypoglycemic episodes:none Hyperglycemic episodes: none Feet problems:no ulcers Blood Sugars averaging: FBS 129 eye exam within last year:  Hypertension:    Poor control in office today.  At dialysis tolerable. BP Readings from Last 3 Encounters:  06/16/20 (!) 160/75  04/30/20 (!) 138/53  03/10/20 (!) 104/50  Using medication without problems or lightheadedness:  Chest pain with exertion: Edema: Short of breath: Average home BPs: Other issues:  Cholesterol due for re-val on statin.    Dyalysis is giving her iron and Bcomplex.  This visit occurred during the SARS-CoV-2 public health emergency.  Safety protocols were in place, including screening questions prior to the visit, additional usage of staff PPE, and extensive cleaning of exam room while observing appropriate contact time as indicated for disinfecting solutions.   COVID 19 screen:  No recent travel or known exposure to COVID19 The patient denies respiratory symptoms of COVID 19 at this time. The importance of social distancing was discussed today.     Review of Systems  Constitutional: Positive for malaise/fatigue. Negative for chills and fever.  HENT: Negative for congestion and ear pain.   Eyes: Negative for pain and redness.  Respiratory: Negative for cough and shortness of breath.   Cardiovascular: Negative for chest pain, palpitations and leg swelling.  Gastrointestinal: Negative for  abdominal pain, blood in stool, constipation, diarrhea, nausea and vomiting.  Genitourinary: Negative for dysuria.  Musculoskeletal: Positive for myalgias. Negative for falls.  Skin: Negative for rash.  Neurological: Negative for dizziness.  Psychiatric/Behavioral: Negative for depression. The patient is not nervous/anxious.       Past Medical History:  Diagnosis Date  . AKI (acute kidney injury) ([place]) 01/2020  . Cancer [facility]) 2005   Breast Cancer  . COPD (chronic obstructive pulmonary disease) ([place])   . Depression   . Diabetes mellitus without complication ([place])   . Hyperlipidemia     reports that she has quit smoking. Her smoking use included cigarettes. She has a 40.00 pack-year smoking history. She has never used smokeless tobacco. She reports that she does not drink alcohol and does not use drugs.   Current Outpatient Medications:  .  albuterol (VENTOLIN HFA) 108 (90 Base) MCG/ACT inhaler, TAKE 2 PUFFS BY MOUTH EVERY 6 HOURS AS NEEDED FOR WHEEZE OR SHORTNESS OF BREATH, Disp: 8.5 g, Rfl: 2 .  amLODipine (NORVASC) 10 MG tablet, Take 1 tablet (10 mg total) by mouth daily., Disp: 30 tablet, Rfl: 0 .  ipratropium-albuterol (DUONEB) 0.5-2.5 (3) MG/3ML SOLN, Take 3 mLs by nebulization every 6 (six) hours as needed., Disp: 360 mL, Rfl: 0 .  JANUVIA 100 MG tablet, TAKE 1 TABLET BY MOUTH DAILY, Disp: 90 tablet, Rfl: 0 .  rosuvastatin (CRESTOR) 20 MG tablet, TAKE 1 TABLET BY MOUTH EVERY DAY, Disp: 90 tablet, Rfl: 0 .  venlafaxine XR (EFFEXOR-XR) 37.5 MG 24 hr capsule, TAKE 1 CAPSULE BY MOUTH DAILY WITH BREAKFAST., Disp: 90 capsule, Rfl: 1 .  furosemide (LASIX) 40 MG tablet, Take 1 tablet (40 mg total) by mouth 2 (two) times daily. (Patient not taking: Reported on 04/29/2020), Disp: 60 tablet, Rfl: 0 .  metoprolol tartrate (LOPRESSOR) 25 MG tablet, Take 1 tablet (25 mg total) by mouth 2 (two) times daily. (Patient not taking: Reported on 04/29/2020), Disp: 60 tablet, Rfl: 0    Observations/Objective: Blood pressure (!) 160/75, pulse 85, temperature 98.5 F (36.9 C), temperature source Temporal, height 5' 1.5" (1.562 m), weight 120 lb (54.4 kg), SpO2 97 %.  Physical Exam Constitutional:      General: She is not in acute distress.    Appearance: Normal appearance. She is well-developed. She is not ill-appearing or toxic-appearing.  HENT:     Head: Normocephalic.     Right Ear: Hearing, tympanic membrane, ear canal and external ear normal. Tympanic membrane is not erythematous, retracted or bulging.     Left Ear: Hearing, tympanic membrane, ear canal and external ear normal. Tympanic membrane is not erythematous, retracted or bulging.     Nose: No mucosal edema or rhinorrhea.     Right Sinus: No maxillary sinus tenderness or frontal sinus tenderness.     Left Sinus: No maxillary sinus tenderness or frontal sinus tenderness.     Mouth/Throat:     Pharynx: Uvula midline.  Eyes:     General: Lids are normal. Lids are everted, no foreign bodies appreciated.     Conjunctiva/sclera: Conjunctivae normal.     Pupils: Pupils are equal, round, and reactive to light.  Neck:     Thyroid: No thyroid mass or thyromegaly.     Vascular: No carotid bruit.     Trachea: Trachea normal.  Cardiovascular:     Rate and Rhythm: Normal rate and regular rhythm.     Pulses: Normal pulses.     Heart sounds: Normal heart sounds, S1 normal and S2 normal. No murmur heard.  No friction rub. No gallop.   Pulmonary:     Effort: Pulmonary effort is normal. No tachypnea or respiratory distress.     Breath sounds: Normal breath sounds. No decreased breath sounds, wheezing, rhonchi or rales.  Abdominal:     General: Bowel sounds are normal.     Palpations: Abdomen is soft.     Tenderness: There is no abdominal tenderness.  Musculoskeletal:     Cervical back: Normal range of motion and neck supple.  Skin:    General: Skin is warm and dry.     Findings: No rash.  Neurological:      Mental Status: She is alert.  Psychiatric:        Mood and Affect: Mood is not anxious or depressed.        Speech: Speech normal.        Behavior: Behavior normal. Behavior is cooperative.        Thought Content: Thought content normal.        Judgment: Judgment normal.      Assessment and Plan   Diabetes mellitus due to underlying condition, controlled, with neurologic complication ([place]) Due for re-eval on januvia. Encouraged exercise, weight loss, healthy eating habits.   ESRD (end stage renal disease) ([place]) Now dialysis longterm as kidney's did not return to previous function.  Hyperlipidemia associated with type 2 diabetes mellitus ([place]) Due for re-eval on statin.  Hypertension associated with diabetes ([place]) Tolerable at dialysis. Continue to follow.  Diabetic autonomic neuropathy Restart gabapentin but at low dose at bedtime.     Ladonna Vanorder,  MD

## 2020-06-17 DIAGNOSIS — N2581 Secondary hyperparathyroidism of renal origin: Secondary | ICD-10-CM | POA: Diagnosis not present

## 2020-06-17 DIAGNOSIS — T8249XA Other complication of vascular dialysis catheter, initial encounter: Secondary | ICD-10-CM | POA: Diagnosis not present

## 2020-06-17 DIAGNOSIS — D689 Coagulation defect, unspecified: Secondary | ICD-10-CM | POA: Diagnosis not present

## 2020-06-17 DIAGNOSIS — D688 Other specified coagulation defects: Secondary | ICD-10-CM | POA: Diagnosis not present

## 2020-06-17 DIAGNOSIS — D649 Anemia, unspecified: Secondary | ICD-10-CM | POA: Diagnosis not present

## 2020-06-17 DIAGNOSIS — N170 Acute kidney failure with tubular necrosis: Secondary | ICD-10-CM | POA: Diagnosis not present

## 2020-06-17 DIAGNOSIS — Z992 Dependence on renal dialysis: Secondary | ICD-10-CM | POA: Diagnosis not present

## 2020-06-17 LAB — HEMOGLOBIN A1C
Hgb A1c MFr Bld: 4.1 % of total Hgb (ref <5.7)
Mean Plasma Glucose: 71 (calc)
eAG (mmol/L): 3.9 (calc)

## 2020-06-17 LAB — LIPID PANEL
Cholesterol: 122 mg/dL (ref <200)
HDL: 55 mg/dL (ref >=50)
LDL Cholesterol (Calc): 49 mg/dL (calc)
Non-HDL Cholesterol (Calc): 67 mg/dL (calc) (ref <130)
Total CHOL/HDL Ratio: 2.2 (calc) (ref <5.0)
Triglycerides: 99 mg/dL (ref <150)

## 2020-06-19 ENCOUNTER — Telehealth: Payer: Medicare Other

## 2020-06-19 ENCOUNTER — Other Ambulatory Visit: Payer: Self-pay | Admitting: Family Medicine

## 2020-06-19 MED ORDER — SITAGLIPTIN PHOSPHATE 25 MG PO TABS: 25.0000 mg | ORAL_TABLET | Freq: Every day | ORAL | 3 refills | Status: DC

## 2020-06-20 DIAGNOSIS — Z23 Encounter for immunization: Secondary | ICD-10-CM | POA: Diagnosis not present

## 2020-06-20 DIAGNOSIS — D689 Coagulation defect, unspecified: Secondary | ICD-10-CM | POA: Diagnosis not present

## 2020-06-20 DIAGNOSIS — D649 Anemia, unspecified: Secondary | ICD-10-CM | POA: Diagnosis not present

## 2020-06-20 DIAGNOSIS — N186 End stage renal disease: Secondary | ICD-10-CM | POA: Diagnosis not present

## 2020-06-20 DIAGNOSIS — Z992 Dependence on renal dialysis: Secondary | ICD-10-CM | POA: Diagnosis not present

## 2020-06-20 DIAGNOSIS — T8249XA Other complication of vascular dialysis catheter, initial encounter: Secondary | ICD-10-CM | POA: Diagnosis not present

## 2020-06-20 DIAGNOSIS — D688 Other specified coagulation defects: Secondary | ICD-10-CM | POA: Diagnosis not present

## 2020-06-20 DIAGNOSIS — D509 Iron deficiency anemia, unspecified: Secondary | ICD-10-CM | POA: Diagnosis not present

## 2020-06-20 DIAGNOSIS — N2581 Secondary hyperparathyroidism of renal origin: Secondary | ICD-10-CM | POA: Diagnosis not present

## 2020-06-21 DIAGNOSIS — M25472 Effusion, left ankle: Secondary | ICD-10-CM | POA: Diagnosis not present

## 2020-06-21 DIAGNOSIS — J449 Chronic obstructive pulmonary disease, unspecified: Secondary | ICD-10-CM | POA: Diagnosis not present

## 2020-06-21 DIAGNOSIS — M25572 Pain in left ankle and joints of left foot: Secondary | ICD-10-CM | POA: Diagnosis not present

## 2020-06-22 ENCOUNTER — Other Ambulatory Visit: Payer: Self-pay | Admitting: Family Medicine

## 2020-06-24 DIAGNOSIS — N186 End stage renal disease: Secondary | ICD-10-CM | POA: Diagnosis not present

## 2020-06-24 DIAGNOSIS — D509 Iron deficiency anemia, unspecified: Secondary | ICD-10-CM | POA: Diagnosis not present

## 2020-06-24 DIAGNOSIS — D689 Coagulation defect, unspecified: Secondary | ICD-10-CM | POA: Diagnosis not present

## 2020-06-24 DIAGNOSIS — Z23 Encounter for immunization: Secondary | ICD-10-CM | POA: Diagnosis not present

## 2020-06-24 DIAGNOSIS — D688 Other specified coagulation defects: Secondary | ICD-10-CM | POA: Diagnosis not present

## 2020-06-24 DIAGNOSIS — Z992 Dependence on renal dialysis: Secondary | ICD-10-CM | POA: Diagnosis not present

## 2020-06-24 DIAGNOSIS — N2581 Secondary hyperparathyroidism of renal origin: Secondary | ICD-10-CM | POA: Diagnosis not present

## 2020-06-24 DIAGNOSIS — D649 Anemia, unspecified: Secondary | ICD-10-CM | POA: Diagnosis not present

## 2020-06-24 DIAGNOSIS — T8249XA Other complication of vascular dialysis catheter, initial encounter: Secondary | ICD-10-CM | POA: Diagnosis not present

## 2020-06-27 ENCOUNTER — Encounter: Payer: Self-pay | Admitting: Nephrology

## 2020-06-27 DIAGNOSIS — T8249XA Other complication of vascular dialysis catheter, initial encounter: Secondary | ICD-10-CM | POA: Diagnosis not present

## 2020-06-27 DIAGNOSIS — D649 Anemia, unspecified: Secondary | ICD-10-CM | POA: Diagnosis not present

## 2020-06-27 DIAGNOSIS — Z992 Dependence on renal dialysis: Secondary | ICD-10-CM | POA: Diagnosis not present

## 2020-06-27 DIAGNOSIS — D689 Coagulation defect, unspecified: Secondary | ICD-10-CM | POA: Diagnosis not present

## 2020-06-27 DIAGNOSIS — D688 Other specified coagulation defects: Secondary | ICD-10-CM | POA: Diagnosis not present

## 2020-06-27 DIAGNOSIS — Z23 Encounter for immunization: Secondary | ICD-10-CM | POA: Diagnosis not present

## 2020-06-27 DIAGNOSIS — N179 Acute kidney failure, unspecified: Secondary | ICD-10-CM | POA: Diagnosis not present

## 2020-06-27 DIAGNOSIS — N2581 Secondary hyperparathyroidism of renal origin: Secondary | ICD-10-CM | POA: Diagnosis not present

## 2020-06-27 DIAGNOSIS — D509 Iron deficiency anemia, unspecified: Secondary | ICD-10-CM | POA: Diagnosis not present

## 2020-06-27 DIAGNOSIS — N186 End stage renal disease: Secondary | ICD-10-CM | POA: Diagnosis not present

## 2020-06-29 DIAGNOSIS — N186 End stage renal disease: Secondary | ICD-10-CM | POA: Diagnosis not present

## 2020-06-29 DIAGNOSIS — D688 Other specified coagulation defects: Secondary | ICD-10-CM | POA: Diagnosis not present

## 2020-06-29 DIAGNOSIS — D649 Anemia, unspecified: Secondary | ICD-10-CM | POA: Diagnosis not present

## 2020-06-29 DIAGNOSIS — Z992 Dependence on renal dialysis: Secondary | ICD-10-CM | POA: Diagnosis not present

## 2020-06-29 DIAGNOSIS — Z23 Encounter for immunization: Secondary | ICD-10-CM | POA: Diagnosis not present

## 2020-06-29 DIAGNOSIS — N2581 Secondary hyperparathyroidism of renal origin: Secondary | ICD-10-CM | POA: Diagnosis not present

## 2020-06-29 DIAGNOSIS — D509 Iron deficiency anemia, unspecified: Secondary | ICD-10-CM | POA: Diagnosis not present

## 2020-06-29 DIAGNOSIS — D689 Coagulation defect, unspecified: Secondary | ICD-10-CM | POA: Diagnosis not present

## 2020-06-29 DIAGNOSIS — T8249XA Other complication of vascular dialysis catheter, initial encounter: Secondary | ICD-10-CM | POA: Diagnosis not present

## 2020-07-01 DIAGNOSIS — N2581 Secondary hyperparathyroidism of renal origin: Secondary | ICD-10-CM | POA: Diagnosis not present

## 2020-07-01 DIAGNOSIS — Z23 Encounter for immunization: Secondary | ICD-10-CM | POA: Diagnosis not present

## 2020-07-01 DIAGNOSIS — D649 Anemia, unspecified: Secondary | ICD-10-CM | POA: Diagnosis not present

## 2020-07-01 DIAGNOSIS — Z992 Dependence on renal dialysis: Secondary | ICD-10-CM | POA: Diagnosis not present

## 2020-07-01 DIAGNOSIS — D689 Coagulation defect, unspecified: Secondary | ICD-10-CM | POA: Diagnosis not present

## 2020-07-01 DIAGNOSIS — D688 Other specified coagulation defects: Secondary | ICD-10-CM | POA: Diagnosis not present

## 2020-07-01 DIAGNOSIS — T8249XA Other complication of vascular dialysis catheter, initial encounter: Secondary | ICD-10-CM | POA: Diagnosis not present

## 2020-07-01 DIAGNOSIS — N186 End stage renal disease: Secondary | ICD-10-CM | POA: Diagnosis not present

## 2020-07-01 DIAGNOSIS — D509 Iron deficiency anemia, unspecified: Secondary | ICD-10-CM | POA: Diagnosis not present

## 2020-07-03 ENCOUNTER — Other Ambulatory Visit: Payer: Self-pay | Admitting: *Deleted

## 2020-07-03 ENCOUNTER — Ambulatory Visit: Payer: Medicare Other | Admitting: Podiatry

## 2020-07-03 ENCOUNTER — Encounter: Payer: Self-pay | Admitting: *Deleted

## 2020-07-03 NOTE — Progress Notes (Signed)
Noted  

## 2020-07-03 NOTE — Patient Outreach (Signed)
[facility]) Care Management [place] Telephone Outreach  07/03/2020  Cindy Burns 02/11/1946 [id number]  Successful telephone outreach to Cindy Burns, 74 y/o female referred to [facility] CM byinpatient RN CMon Feb 17, 2020;afterhospitalizationApril 3- Feb 19, 2020 foracute renal failure/ DKA/ acute respiratory failure and required initiation of hemodialysis while hospitalized.Patient has history including, but not limited to, breast cancer; DM; COPD; depression; HTN/ HLD; hepatomegaly; and tobacco use. Patient was discharged home to self-care with home health services through [place], which are now completed.  HIPAA/ identity verified; patient reports "doingpretty good today" and adds when questioned that she is "feeling better" about her recent news that she had to increase her hemodialysis sessions from twice a week to three times per week; now attending on Tues/ Thurs/ Sat. She reports that she has been driving self to hemodialysis and doctor's appointments and errands.  Patient sounds to be in no distress throughout call today.  -- discussed current clinical condition with patient and confirmed no current clinical concerns -- reviewed recent PCP office visit 06/16/20: discussed changes made to medications and encouraged patient to promptly contact outpatient pharmacy to obtain newly dose of Januvia: encouraged patient to contact me promptly if she has questions/ concerns  around completing this important task -- confirmed patient obtained flu shot at recent PCP office visit -- reviewed most recent A1-C with patient and reviewed previously provided educational material around same -- reviewed recently recorded blood sugars at home with patient; patient reports consistent fasting blood sugars between 129-134; continues checking blood sugars at home QD fasting -- confirmed that patient continues attending hemodialysis sessions as recommended, Tues/ Thurs/ Sat -- updated  SDOH around transportation- confirmed patient driving self to all appointments -- sent [facility] CM quarterly update summary to patient's PCP  Patient denies further issues, concerns, or problems today. Iconfirmed that patient hasmy direct phone number, the main [facility] CM office phone number, and the [facility] CM 24-hour nurse advice phone number should issues arise prior to next scheduled [facility] CM outreach. Encouraged patient to contact me directly if needs, questions, issues, or concerns arise prior to next scheduled outreach; patient agreed to do so.  Discussed with patient that she has thus far made great progress in meeting her previously established [facility] CM goals; discussed possibility of transfer to [place] in the future if she continues doing well, without care coordination needs, patient is agreeable to same.  Plan:  Patient will take medications as prescribed and will attend all scheduled providerand hemodialysisappointments  Patient will promptly notify care providers for any new concerns/ issues/ problems that arise  Patient will continue monitoring/ recordingfasting morningdailyblood sugars  Patient will contact her outpatient pharmacy and obtain/ start taking recently prescribed (lower dose) Januvia 25 mg   I will send today's THN CM note/ care plan to PCP as [facility] CM quarterly update summary  THN CM outreach to continue with scheduled phone callas scheduled for next month, sooner if indicated  [name], RN, BSN, [employer] Coordinator [facility] Care Management  [phone]

## 2020-07-04 DIAGNOSIS — D688 Other specified coagulation defects: Secondary | ICD-10-CM | POA: Diagnosis not present

## 2020-07-04 DIAGNOSIS — N186 End stage renal disease: Secondary | ICD-10-CM | POA: Diagnosis not present

## 2020-07-04 DIAGNOSIS — T8249XA Other complication of vascular dialysis catheter, initial encounter: Secondary | ICD-10-CM | POA: Diagnosis not present

## 2020-07-04 DIAGNOSIS — D649 Anemia, unspecified: Secondary | ICD-10-CM | POA: Diagnosis not present

## 2020-07-04 DIAGNOSIS — N2581 Secondary hyperparathyroidism of renal origin: Secondary | ICD-10-CM | POA: Diagnosis not present

## 2020-07-04 DIAGNOSIS — Z992 Dependence on renal dialysis: Secondary | ICD-10-CM | POA: Diagnosis not present

## 2020-07-04 DIAGNOSIS — D509 Iron deficiency anemia, unspecified: Secondary | ICD-10-CM | POA: Diagnosis not present

## 2020-07-04 DIAGNOSIS — D689 Coagulation defect, unspecified: Secondary | ICD-10-CM | POA: Diagnosis not present

## 2020-07-04 DIAGNOSIS — Z23 Encounter for immunization: Secondary | ICD-10-CM | POA: Diagnosis not present

## 2020-07-06 DIAGNOSIS — Z992 Dependence on renal dialysis: Secondary | ICD-10-CM | POA: Diagnosis not present

## 2020-07-06 DIAGNOSIS — T8249XA Other complication of vascular dialysis catheter, initial encounter: Secondary | ICD-10-CM | POA: Diagnosis not present

## 2020-07-06 DIAGNOSIS — D649 Anemia, unspecified: Secondary | ICD-10-CM | POA: Diagnosis not present

## 2020-07-06 DIAGNOSIS — N186 End stage renal disease: Secondary | ICD-10-CM | POA: Diagnosis not present

## 2020-07-06 DIAGNOSIS — N179 Acute kidney failure, unspecified: Secondary | ICD-10-CM | POA: Diagnosis not present

## 2020-07-06 DIAGNOSIS — Z23 Encounter for immunization: Secondary | ICD-10-CM | POA: Diagnosis not present

## 2020-07-06 DIAGNOSIS — D689 Coagulation defect, unspecified: Secondary | ICD-10-CM | POA: Diagnosis not present

## 2020-07-06 DIAGNOSIS — D688 Other specified coagulation defects: Secondary | ICD-10-CM | POA: Diagnosis not present

## 2020-07-06 DIAGNOSIS — D509 Iron deficiency anemia, unspecified: Secondary | ICD-10-CM | POA: Diagnosis not present

## 2020-07-06 DIAGNOSIS — N2581 Secondary hyperparathyroidism of renal origin: Secondary | ICD-10-CM | POA: Diagnosis not present

## 2020-07-07 ENCOUNTER — Other Ambulatory Visit: Payer: Self-pay

## 2020-07-07 ENCOUNTER — Encounter: Payer: Self-pay | Admitting: Podiatry

## 2020-07-07 ENCOUNTER — Ambulatory Visit (INDEPENDENT_AMBULATORY_CARE_PROVIDER_SITE_OTHER): Payer: Medicare Other | Admitting: Podiatry

## 2020-07-07 ENCOUNTER — Ambulatory Visit (INDEPENDENT_AMBULATORY_CARE_PROVIDER_SITE_OTHER): Payer: Medicare Other

## 2020-07-07 DIAGNOSIS — R2689 Other abnormalities of gait and mobility: Secondary | ICD-10-CM

## 2020-07-07 DIAGNOSIS — Z9181 History of falling: Secondary | ICD-10-CM

## 2020-07-07 DIAGNOSIS — E0842 Diabetes mellitus due to underlying condition with diabetic polyneuropathy: Secondary | ICD-10-CM

## 2020-07-07 DIAGNOSIS — M21371 Foot drop, right foot: Secondary | ICD-10-CM

## 2020-07-07 DIAGNOSIS — R52 Pain, unspecified: Secondary | ICD-10-CM

## 2020-07-07 DIAGNOSIS — R2681 Unsteadiness on feet: Secondary | ICD-10-CM | POA: Diagnosis not present

## 2020-07-07 DIAGNOSIS — M21372 Foot drop, left foot: Secondary | ICD-10-CM

## 2020-07-07 NOTE — Progress Notes (Signed)
Subjective:  Patient ID: Cindy Burns, female    DOB: 04/15/1946,  MRN: [id number]  Chief Complaint  Patient presents with  . Foot Pain    b/l foot pain. pt states pain started when she was hospitalized for 42 days. pt states since she has left the hospital and able to ambulate the foot drop has improved.    74 y.o. female presents with the above complaint.  Patient presents with complaint of bilateral moderate foot drop when she was hospitalized for 42 days.  Patient states that she has been able to improve the foot drop considerably.  She was given a brace in the hospital which she did not like because of the metal rods to the side.  She does have history of falling due to unsteady antalgic gait.  Patient has had a history of falling down twice without any acute injuries.  Patient would like to discuss different type of bracing to help kind of allow her to ambulate with a steady gait.  She denies any other acute complaints she has not seen anyone else prior to seeing me.   Review of Systems: Negative except as noted in the HPI. Denies N/V/F/Ch.  Past Medical History:  Diagnosis Date  . AKI (acute kidney injury) ([place]) 01/2020  . Cancer [facility]) 2005   Breast Cancer  . COPD (chronic obstructive pulmonary disease) ([place])   . Depression   . Diabetes mellitus without complication ([place])   . Hyperlipidemia     Current Outpatient Medications:  .  albuterol (VENTOLIN HFA) 108 (90 Base) MCG/ACT inhaler, TAKE 2 PUFFS BY MOUTH EVERY 6 HOURS AS NEEDED FOR WHEEZE OR SHORTNESS OF BREATH, Disp: 8.5 g, Rfl: 2 .  amLODipine (NORVASC) 10 MG tablet, Take 1 tablet (10 mg total) by mouth daily., Disp: 30 tablet, Rfl: 0 .  furosemide (LASIX) 40 MG tablet, Take 1 tablet (40 mg total) by mouth 2 (two) times daily., Disp: 60 tablet, Rfl: 0 .  gabapentin (NEURONTIN) 100 MG capsule, Take 1 capsule (100 mg total) by mouth at bedtime., Disp: 30 capsule, Rfl: 3 .  ipratropium-albuterol (DUONEB) 0.5-2.5 (3) MG/3ML  SOLN, Take 3 mLs by nebulization every 6 (six) hours as needed., Disp: 360 mL, Rfl: 0 .  metoprolol tartrate (LOPRESSOR) 25 MG tablet, Take 1 tablet (25 mg total) by mouth 2 (two) times daily., Disp: 60 tablet, Rfl: 0 .  rosuvastatin (CRESTOR) 20 MG tablet, TAKE 1 TABLET BY MOUTH EVERY DAY, Disp: 90 tablet, Rfl: 0 .  sitaGLIPtin (JANUVIA) 25 MG tablet, Take 1 tablet (25 mg total) by mouth daily., Disp: 30 tablet, Rfl: 3 .  venlafaxine XR (EFFEXOR-XR) 37.5 MG 24 hr capsule, TAKE 1 CAPSULE BY MOUTH DAILY WITH BREAKFAST., Disp: 90 capsule, Rfl: 1  Social History   Tobacco Use  Smoking Status Former Smoker  . Packs/day: 1.00  . Years: 40.00  . Pack years: 40.00  . Types: Cigarettes  Smokeless Tobacco Never Used    No Known Allergies Objective:  There were no vitals filed for this visit. There is no height or weight on file to calculate BMI. Constitutional Well developed. Well nourished.  Vascular Dorsalis pedis pulses palpable bilaterally. Posterior tibial pulses palpable bilaterally. Capillary refill normal to all digits.  No cyanosis or clubbing noted. Pedal hair growth normal.  Neurologic Normal speech. Oriented to person, place, and time. Epicritic sensation to light touch grossly present bilaterally.  Dermatologic Nails well groomed and normal in appearance. No open wounds. No skin lesions.  Orthopedic:  No pain on palpation to bilateral lower extremity.  Manual muscle testing shows 4 out of 5 bilaterally.  Very mild component of foot drop noted bilaterally.  Patient has good muscular strength of the lower extremity.   Radiographs: 3 views of skeletally mature adult foot: No osseous abnormalities identified.  No fractures noted.  Mild osteopenia noted. Assessment:   1. Foot drop, bilateral   2. Antalgic gait   3. Personal history of fall   4. Unsteady gait   5. Diabetes mellitus due to underlying condition, controlled, with diabetic polyneuropathy, without long-term  current use of insulin ([place])    Plan:  Patient was evaluated and treated and all questions answered.  Antalgic gait with secondary to underlying possible mild foot drop with personal history of multiple falls. -I explained to patient the etiology of antalgic gait and various treatment options were discussed.  Clinically patient does not have very strong indication for foot drop as patient strength has returned to more normal.  She has good strength to lower extremity muscles with manual muscle testing of 4 out of 5 bilaterally.  However with history of multiple falls with ambulation with single-point cane I believe patient will benefit from worse balance braces to allow her to walk without any falls.  Patient states understanding -She will be scheduled to see rec form of [place] balance bracing.  Return in about 3 months (around 10/07/2020).

## 2020-07-08 DIAGNOSIS — D689 Coagulation defect, unspecified: Secondary | ICD-10-CM | POA: Diagnosis not present

## 2020-07-08 DIAGNOSIS — N2581 Secondary hyperparathyroidism of renal origin: Secondary | ICD-10-CM | POA: Diagnosis not present

## 2020-07-08 DIAGNOSIS — T8249XA Other complication of vascular dialysis catheter, initial encounter: Secondary | ICD-10-CM | POA: Diagnosis not present

## 2020-07-08 DIAGNOSIS — N186 End stage renal disease: Secondary | ICD-10-CM | POA: Diagnosis not present

## 2020-07-08 DIAGNOSIS — D688 Other specified coagulation defects: Secondary | ICD-10-CM | POA: Diagnosis not present

## 2020-07-08 DIAGNOSIS — Z992 Dependence on renal dialysis: Secondary | ICD-10-CM | POA: Diagnosis not present

## 2020-07-08 DIAGNOSIS — D649 Anemia, unspecified: Secondary | ICD-10-CM | POA: Diagnosis not present

## 2020-07-08 DIAGNOSIS — D509 Iron deficiency anemia, unspecified: Secondary | ICD-10-CM | POA: Diagnosis not present

## 2020-07-08 DIAGNOSIS — Z23 Encounter for immunization: Secondary | ICD-10-CM | POA: Diagnosis not present

## 2020-07-10 ENCOUNTER — Ambulatory Visit: Payer: Medicare Other | Admitting: Orthotics

## 2020-07-10 ENCOUNTER — Other Ambulatory Visit: Payer: Self-pay

## 2020-07-10 DIAGNOSIS — Z9181 History of falling: Secondary | ICD-10-CM

## 2020-07-10 DIAGNOSIS — R2689 Other abnormalities of gait and mobility: Secondary | ICD-10-CM

## 2020-07-10 DIAGNOSIS — M21371 Foot drop, right foot: Secondary | ICD-10-CM

## 2020-07-10 NOTE — Progress Notes (Signed)
Patient  Unable to get brace b/c she just got braces for foot drop in May from another vendor.   I explained to her Medicare's "same or similar" exclusion on DME.   I suggested she go back to vendor if she is having problem with fit, comfort, or function.

## 2020-07-11 DIAGNOSIS — T8249XA Other complication of vascular dialysis catheter, initial encounter: Secondary | ICD-10-CM | POA: Diagnosis not present

## 2020-07-11 DIAGNOSIS — D689 Coagulation defect, unspecified: Secondary | ICD-10-CM | POA: Diagnosis not present

## 2020-07-11 DIAGNOSIS — D649 Anemia, unspecified: Secondary | ICD-10-CM | POA: Diagnosis not present

## 2020-07-11 DIAGNOSIS — N2581 Secondary hyperparathyroidism of renal origin: Secondary | ICD-10-CM | POA: Diagnosis not present

## 2020-07-11 DIAGNOSIS — Z992 Dependence on renal dialysis: Secondary | ICD-10-CM | POA: Diagnosis not present

## 2020-07-11 DIAGNOSIS — Z23 Encounter for immunization: Secondary | ICD-10-CM | POA: Diagnosis not present

## 2020-07-11 DIAGNOSIS — N186 End stage renal disease: Secondary | ICD-10-CM | POA: Diagnosis not present

## 2020-07-11 DIAGNOSIS — D688 Other specified coagulation defects: Secondary | ICD-10-CM | POA: Diagnosis not present

## 2020-07-11 DIAGNOSIS — D509 Iron deficiency anemia, unspecified: Secondary | ICD-10-CM | POA: Diagnosis not present

## 2020-07-12 NOTE — Assessment & Plan Note (Signed)
Now dialysis longterm as kidney's did not return to previous function.

## 2020-07-12 NOTE — Assessment & Plan Note (Signed)
Due for re-eval on januvia. Encouraged exercise, weight loss, healthy eating habits.

## 2020-07-12 NOTE — Assessment & Plan Note (Signed)
Tolerable at dialysis. Continue to follow.

## 2020-07-12 NOTE — Assessment & Plan Note (Signed)
Due for re-eval on statin 

## 2020-07-12 NOTE — Assessment & Plan Note (Signed)
Restart gabapentin but at low dose at bedtime.

## 2020-07-13 DIAGNOSIS — T8249XA Other complication of vascular dialysis catheter, initial encounter: Secondary | ICD-10-CM | POA: Diagnosis not present

## 2020-07-13 DIAGNOSIS — N186 End stage renal disease: Secondary | ICD-10-CM | POA: Diagnosis not present

## 2020-07-13 DIAGNOSIS — N2581 Secondary hyperparathyroidism of renal origin: Secondary | ICD-10-CM | POA: Diagnosis not present

## 2020-07-13 DIAGNOSIS — Z992 Dependence on renal dialysis: Secondary | ICD-10-CM | POA: Diagnosis not present

## 2020-07-13 DIAGNOSIS — Z23 Encounter for immunization: Secondary | ICD-10-CM | POA: Diagnosis not present

## 2020-07-13 DIAGNOSIS — D649 Anemia, unspecified: Secondary | ICD-10-CM | POA: Diagnosis not present

## 2020-07-13 DIAGNOSIS — D688 Other specified coagulation defects: Secondary | ICD-10-CM | POA: Diagnosis not present

## 2020-07-13 DIAGNOSIS — D689 Coagulation defect, unspecified: Secondary | ICD-10-CM | POA: Diagnosis not present

## 2020-07-13 DIAGNOSIS — D509 Iron deficiency anemia, unspecified: Secondary | ICD-10-CM | POA: Diagnosis not present

## 2020-07-15 DIAGNOSIS — Z992 Dependence on renal dialysis: Secondary | ICD-10-CM | POA: Diagnosis not present

## 2020-07-15 DIAGNOSIS — Z23 Encounter for immunization: Secondary | ICD-10-CM | POA: Diagnosis not present

## 2020-07-15 DIAGNOSIS — D649 Anemia, unspecified: Secondary | ICD-10-CM | POA: Diagnosis not present

## 2020-07-15 DIAGNOSIS — D689 Coagulation defect, unspecified: Secondary | ICD-10-CM | POA: Diagnosis not present

## 2020-07-15 DIAGNOSIS — D688 Other specified coagulation defects: Secondary | ICD-10-CM | POA: Diagnosis not present

## 2020-07-15 DIAGNOSIS — T8249XA Other complication of vascular dialysis catheter, initial encounter: Secondary | ICD-10-CM | POA: Diagnosis not present

## 2020-07-15 DIAGNOSIS — N186 End stage renal disease: Secondary | ICD-10-CM | POA: Diagnosis not present

## 2020-07-15 DIAGNOSIS — N2581 Secondary hyperparathyroidism of renal origin: Secondary | ICD-10-CM | POA: Diagnosis not present

## 2020-07-15 DIAGNOSIS — D509 Iron deficiency anemia, unspecified: Secondary | ICD-10-CM | POA: Diagnosis not present

## 2020-07-18 DIAGNOSIS — N186 End stage renal disease: Secondary | ICD-10-CM | POA: Diagnosis not present

## 2020-07-18 DIAGNOSIS — D649 Anemia, unspecified: Secondary | ICD-10-CM | POA: Diagnosis not present

## 2020-07-18 DIAGNOSIS — D509 Iron deficiency anemia, unspecified: Secondary | ICD-10-CM | POA: Diagnosis not present

## 2020-07-18 DIAGNOSIS — D688 Other specified coagulation defects: Secondary | ICD-10-CM | POA: Diagnosis not present

## 2020-07-18 DIAGNOSIS — N2581 Secondary hyperparathyroidism of renal origin: Secondary | ICD-10-CM | POA: Diagnosis not present

## 2020-07-18 DIAGNOSIS — D689 Coagulation defect, unspecified: Secondary | ICD-10-CM | POA: Diagnosis not present

## 2020-07-18 DIAGNOSIS — Z23 Encounter for immunization: Secondary | ICD-10-CM | POA: Diagnosis not present

## 2020-07-18 DIAGNOSIS — Z992 Dependence on renal dialysis: Secondary | ICD-10-CM | POA: Diagnosis not present

## 2020-07-18 DIAGNOSIS — T8249XA Other complication of vascular dialysis catheter, initial encounter: Secondary | ICD-10-CM | POA: Diagnosis not present

## 2020-07-20 ENCOUNTER — Other Ambulatory Visit: Payer: Self-pay

## 2020-07-20 DIAGNOSIS — N2581 Secondary hyperparathyroidism of renal origin: Secondary | ICD-10-CM | POA: Diagnosis not present

## 2020-07-20 DIAGNOSIS — D509 Iron deficiency anemia, unspecified: Secondary | ICD-10-CM | POA: Diagnosis not present

## 2020-07-20 DIAGNOSIS — D689 Coagulation defect, unspecified: Secondary | ICD-10-CM | POA: Diagnosis not present

## 2020-07-20 DIAGNOSIS — T8249XA Other complication of vascular dialysis catheter, initial encounter: Secondary | ICD-10-CM | POA: Diagnosis not present

## 2020-07-20 DIAGNOSIS — Z992 Dependence on renal dialysis: Secondary | ICD-10-CM | POA: Diagnosis not present

## 2020-07-20 DIAGNOSIS — Z23 Encounter for immunization: Secondary | ICD-10-CM | POA: Diagnosis not present

## 2020-07-20 DIAGNOSIS — D649 Anemia, unspecified: Secondary | ICD-10-CM | POA: Diagnosis not present

## 2020-07-20 DIAGNOSIS — N186 End stage renal disease: Secondary | ICD-10-CM | POA: Diagnosis not present

## 2020-07-20 DIAGNOSIS — D688 Other specified coagulation defects: Secondary | ICD-10-CM | POA: Diagnosis not present

## 2020-07-20 NOTE — Patient Outreach (Signed)
[facility]) Care Management  07/20/2020  Cindy Burns [birth year] [id number]  Called patient to complete satisfaction survey. Left message for patient to call back.  [place] Management Assistant [phone]

## 2020-07-22 DIAGNOSIS — T8249XA Other complication of vascular dialysis catheter, initial encounter: Secondary | ICD-10-CM | POA: Diagnosis not present

## 2020-07-22 DIAGNOSIS — Z23 Encounter for immunization: Secondary | ICD-10-CM | POA: Diagnosis not present

## 2020-07-22 DIAGNOSIS — Z992 Dependence on renal dialysis: Secondary | ICD-10-CM | POA: Diagnosis not present

## 2020-07-22 DIAGNOSIS — D689 Coagulation defect, unspecified: Secondary | ICD-10-CM | POA: Diagnosis not present

## 2020-07-22 DIAGNOSIS — D649 Anemia, unspecified: Secondary | ICD-10-CM | POA: Diagnosis not present

## 2020-07-22 DIAGNOSIS — N186 End stage renal disease: Secondary | ICD-10-CM | POA: Diagnosis not present

## 2020-07-22 DIAGNOSIS — D509 Iron deficiency anemia, unspecified: Secondary | ICD-10-CM | POA: Diagnosis not present

## 2020-07-22 DIAGNOSIS — N2581 Secondary hyperparathyroidism of renal origin: Secondary | ICD-10-CM | POA: Diagnosis not present

## 2020-07-22 DIAGNOSIS — D688 Other specified coagulation defects: Secondary | ICD-10-CM | POA: Diagnosis not present

## 2020-07-25 DIAGNOSIS — Z992 Dependence on renal dialysis: Secondary | ICD-10-CM | POA: Diagnosis not present

## 2020-07-25 DIAGNOSIS — D688 Other specified coagulation defects: Secondary | ICD-10-CM | POA: Diagnosis not present

## 2020-07-25 DIAGNOSIS — D689 Coagulation defect, unspecified: Secondary | ICD-10-CM | POA: Diagnosis not present

## 2020-07-25 DIAGNOSIS — Z23 Encounter for immunization: Secondary | ICD-10-CM | POA: Diagnosis not present

## 2020-07-25 DIAGNOSIS — D649 Anemia, unspecified: Secondary | ICD-10-CM | POA: Diagnosis not present

## 2020-07-25 DIAGNOSIS — T8249XA Other complication of vascular dialysis catheter, initial encounter: Secondary | ICD-10-CM | POA: Diagnosis not present

## 2020-07-25 DIAGNOSIS — N2581 Secondary hyperparathyroidism of renal origin: Secondary | ICD-10-CM | POA: Diagnosis not present

## 2020-07-25 DIAGNOSIS — D509 Iron deficiency anemia, unspecified: Secondary | ICD-10-CM | POA: Diagnosis not present

## 2020-07-25 DIAGNOSIS — N186 End stage renal disease: Secondary | ICD-10-CM | POA: Diagnosis not present

## 2020-07-27 ENCOUNTER — Other Ambulatory Visit: Payer: Self-pay | Admitting: Family Medicine

## 2020-07-27 DIAGNOSIS — D689 Coagulation defect, unspecified: Secondary | ICD-10-CM | POA: Diagnosis not present

## 2020-07-27 DIAGNOSIS — D509 Iron deficiency anemia, unspecified: Secondary | ICD-10-CM | POA: Diagnosis not present

## 2020-07-27 DIAGNOSIS — Z23 Encounter for immunization: Secondary | ICD-10-CM | POA: Diagnosis not present

## 2020-07-27 DIAGNOSIS — D649 Anemia, unspecified: Secondary | ICD-10-CM | POA: Diagnosis not present

## 2020-07-27 DIAGNOSIS — N2581 Secondary hyperparathyroidism of renal origin: Secondary | ICD-10-CM | POA: Diagnosis not present

## 2020-07-27 DIAGNOSIS — Z992 Dependence on renal dialysis: Secondary | ICD-10-CM | POA: Diagnosis not present

## 2020-07-27 DIAGNOSIS — D688 Other specified coagulation defects: Secondary | ICD-10-CM | POA: Diagnosis not present

## 2020-07-27 DIAGNOSIS — T8249XA Other complication of vascular dialysis catheter, initial encounter: Secondary | ICD-10-CM | POA: Diagnosis not present

## 2020-07-27 DIAGNOSIS — N186 End stage renal disease: Secondary | ICD-10-CM | POA: Diagnosis not present

## 2020-07-29 DIAGNOSIS — D649 Anemia, unspecified: Secondary | ICD-10-CM | POA: Diagnosis not present

## 2020-07-29 DIAGNOSIS — N186 End stage renal disease: Secondary | ICD-10-CM | POA: Diagnosis not present

## 2020-07-29 DIAGNOSIS — D509 Iron deficiency anemia, unspecified: Secondary | ICD-10-CM | POA: Diagnosis not present

## 2020-07-29 DIAGNOSIS — D689 Coagulation defect, unspecified: Secondary | ICD-10-CM | POA: Diagnosis not present

## 2020-07-29 DIAGNOSIS — T8249XA Other complication of vascular dialysis catheter, initial encounter: Secondary | ICD-10-CM | POA: Diagnosis not present

## 2020-07-29 DIAGNOSIS — Z992 Dependence on renal dialysis: Secondary | ICD-10-CM | POA: Diagnosis not present

## 2020-07-29 DIAGNOSIS — D688 Other specified coagulation defects: Secondary | ICD-10-CM | POA: Diagnosis not present

## 2020-07-29 DIAGNOSIS — Z23 Encounter for immunization: Secondary | ICD-10-CM | POA: Diagnosis not present

## 2020-07-29 DIAGNOSIS — N2581 Secondary hyperparathyroidism of renal origin: Secondary | ICD-10-CM | POA: Diagnosis not present

## 2020-08-01 DIAGNOSIS — N2581 Secondary hyperparathyroidism of renal origin: Secondary | ICD-10-CM | POA: Diagnosis not present

## 2020-08-01 DIAGNOSIS — D649 Anemia, unspecified: Secondary | ICD-10-CM | POA: Diagnosis not present

## 2020-08-01 DIAGNOSIS — Z992 Dependence on renal dialysis: Secondary | ICD-10-CM | POA: Diagnosis not present

## 2020-08-01 DIAGNOSIS — D509 Iron deficiency anemia, unspecified: Secondary | ICD-10-CM | POA: Diagnosis not present

## 2020-08-01 DIAGNOSIS — T8249XA Other complication of vascular dialysis catheter, initial encounter: Secondary | ICD-10-CM | POA: Diagnosis not present

## 2020-08-01 DIAGNOSIS — Z23 Encounter for immunization: Secondary | ICD-10-CM | POA: Diagnosis not present

## 2020-08-01 DIAGNOSIS — D689 Coagulation defect, unspecified: Secondary | ICD-10-CM | POA: Diagnosis not present

## 2020-08-01 DIAGNOSIS — D688 Other specified coagulation defects: Secondary | ICD-10-CM | POA: Diagnosis not present

## 2020-08-01 DIAGNOSIS — N186 End stage renal disease: Secondary | ICD-10-CM | POA: Diagnosis not present

## 2020-08-03 DIAGNOSIS — Z23 Encounter for immunization: Secondary | ICD-10-CM | POA: Diagnosis not present

## 2020-08-03 DIAGNOSIS — D689 Coagulation defect, unspecified: Secondary | ICD-10-CM | POA: Diagnosis not present

## 2020-08-03 DIAGNOSIS — D688 Other specified coagulation defects: Secondary | ICD-10-CM | POA: Diagnosis not present

## 2020-08-03 DIAGNOSIS — Z992 Dependence on renal dialysis: Secondary | ICD-10-CM | POA: Diagnosis not present

## 2020-08-03 DIAGNOSIS — T8249XA Other complication of vascular dialysis catheter, initial encounter: Secondary | ICD-10-CM | POA: Diagnosis not present

## 2020-08-03 DIAGNOSIS — N186 End stage renal disease: Secondary | ICD-10-CM | POA: Diagnosis not present

## 2020-08-03 DIAGNOSIS — D649 Anemia, unspecified: Secondary | ICD-10-CM | POA: Diagnosis not present

## 2020-08-03 DIAGNOSIS — N2581 Secondary hyperparathyroidism of renal origin: Secondary | ICD-10-CM | POA: Diagnosis not present

## 2020-08-03 DIAGNOSIS — D509 Iron deficiency anemia, unspecified: Secondary | ICD-10-CM | POA: Diagnosis not present

## 2020-08-04 ENCOUNTER — Other Ambulatory Visit: Payer: Self-pay | Admitting: *Deleted

## 2020-08-04 ENCOUNTER — Encounter: Payer: Self-pay | Admitting: *Deleted

## 2020-08-04 NOTE — Patient Outreach (Signed)
Cindy Burns [facility]) Care Management [facility] CM Telephone Outreach- transfer to [place]  08/04/2020  Cindy Burns 1946-04-23 [id number]  [place], 74 y/o female referred to [facility] CM byinpatient RN CMon Feb 17, 2020;afterhospitalizationApril 3- Feb 19, 2020 foracute renal failure/ DKA/ acute respiratory failure and required initiation of hemodialysis while hospitalized.Patient has history including, but not limited to, breast cancer; DM; COPD; depression; HTN/ HLD; hepatomegaly; and tobacco use. Patient was discharged home to self-care with home health services through [place], which are now completed.  HIPAA/ identity verified; patient reports "doingvery good today" and she sounds very upbeat and happy; early happy birthday wishes extended to patient who reports she has already celebrated by going out for dinner with a friend.  Reports continues attending established hemodialysis sessions T/ Th/ sat; has not missed any sessions.  Continues monitoring/ recording fasting blood sugars and these were reviewed with patient today; confirms no hypoglycemic episodes.  Reiterated previously provided education around signs/ symptoms hypoglycemia along with corresponding action plan; discussed general guidelines for sick day rules, encouraged patient to promptly contact care providers should she feel sick or have unusual blood sugar readings.  Today, she reports fasting blood sugars consistently between 100-110.  I again provided positive reinforcement for her last A1-C value of 4.1 (06/16/20) and we reviewed her most recent trends.  Patient sounds to be in no distress throughout call today and denies further issues, concerns, or problems. We again discussed that she has thus far made great progress in meeting her previously established [facility] CM goals and possibility of transfer to [place]-- patient is agreeable to this today.  She  verbalizes no ongoing care coordination/ pharmacy/ community resource needs and could benefit from ongoing disease management education/ support in self-health management of DM- encouraged her active engagement with [place] once outreach is established.  Iconfirmed that patient hasmy direct phone number, the main [facility] CM office phone number, and the [facility] CM 24-hour nurse advice phone number should issues arise prior to [place].  Plan:  Patient will take medications as prescribed and will attend all scheduled providerand hemodialysisappointments  Patient will promptly notify care providers for any new concerns/ issues/ problems that arise  Patient will continue monitoring/ recordingfasting morningdailyblood sugars  I will place referral for [facility] and make her PCP aware of same.  Goals Addressed              This Visit's Progress   .  COMPLETED: THN CM: "I just want to keep doing the things I need to to stay healthy" (pt-stated)   On track     [place] (see longitudinal plan of care for additional care plan information)  Current Barriers:  . Chronic Disease Management support and education needs related to self-health management of renal disease and DM  Nurse Case Manager Clinical Goal(s):  [name] Over the next 30 days, patient will attend all scheduled medical provider appointments and hemodialysis sessions 06/02/20: on track; goal maintained; 07/03/20: on track; goal maintained . Over the next 90 days, the patient will demonstrate ongoing self health care management ability as evidenced by daily monitoring and recording of blood sugars at home 06/02/20: on track; goal maintained 07/03/20: on track; goal maintained  Interventions:  . Inter-disciplinary care team collaboration (see longitudinal plan of care) . Evaluation of current treatment plan related to hemodialysis/ renal failure/ DM and patient's adherence to plan as  established by provider. . Advised patient to promptly retrieve newly prescribed antibiotics from outpatient pharmacy . Provided education to patient re: diabetes and renal diets; importance of regular monitoring of blood sugars at home . Reviewed medications with patient and discussed importance of taking as prescribed and reviewing with PCP with each office visit . Discussed plans with patient for ongoing care management follow up and provided patient with direct contact information for care management team . Advised patient, providing education and rationale, to check cbg once daily at home (fasting) and to record, and to take with her to all PCP office visits; reviewed recently recorded blood sugars at home with patient    06/02/20: -- [facility] CM outreach completed with patient -- confirmed no current clinical concerns -- discussed fall prevention; placed printed educational material in mail to patient around same -- reviewed upcoming provider appointments and hemodialysis sessions -- discussed patient's current understanding of self-health management of DM; her recent A1-C trends; her understanding of significance of A1-C value; placed printed educational material in mail to patient around understanding A1-C values -- reviewed recent fasting blood sugars at home: encouraged patient to continue monitor and record blood sugars at home and to take recorded values with her to upcoming PCP office visit -- coached patient around talking points to cover at time of upcoming scheduled PCP office visit: encouraged patient to ensure that updated A1-C value would be completed at time of scheduled visit -- discussed signs/ symptoms MI/ CVA, along with corresponding action plan for same- provided education around patient's baseline knowledge  06/14/20: -- emotional support provided; discussed talking points to cover with care providers at upcoming appointments  07/03/20: -- discussed current clinical condition  with patient and confirmed no current clinical concerns -- reviewed recent PCP office visit 06/16/20: discussed changes made to medications and encouraged patient to promptly contact outpatient pharmacy to obtain newly dose of Januvia: encouraged patient to contact me promptly if she has questions/ concerns  around completing this important task -- confirmed patient obtained flu shot at recent PCP office visit -- reviewed most recent A1-C with patient and reviewed previously provided educational material around same -- reviewed recently recorded blood sugars at home with patient -- confirmed that patient continues attending hemodialysis sessions as recommended, Tues/ Thurs/ Sat -- updated SDOH around transportation- confirmed patient driving self to all appointments -- sent [facility] CM quarterly update summary to patient's PCP  08/04/20: -- confirmed no clinical concerns/ issues/ problems -- confirmed patient has started taking new dose of januvia (25 mg) -- discussed/ reviewed recent fasting blood sugars at home- between 100-110 consistently; reiterated previously provided education around A1-C, with patient trends -- confirmed no hypoglycemic episodes; discussed signs/ symptoms hypoglycemia along with corresponding action plan for same; discussed general guidelines around sick day rules -- confirmed patient has continues attending established hemodialysis sessions T/ Th/ Sa- reports no missed sessions -- placed referral for [place] for ongoing support/ disease management education around DM and made patient's PCP aware of same; encouraged patient's active engagement with [place] coach once outreach is established  It has been a pleasure caring for [place], [place] Jamse Arn, RN, BSN, [employer] Coordinator [place] Management  [phone]    Patient Self Care Activities: -- Attends provider appointments and hemodialysis sessions as scheduled -- needs  reinforcement around importance of consistency in monitoring blood sugars at home and adherence to medication regimine  Initial goal documentation 05/05/20 Updated 06/02/20: on  track Updated 07/03/20: on track  [name], RN, BSN, [place] Care Management  [phone]        [name], RN, BSN, [place] Coordinator [facility] Care Management  [phone]

## 2020-08-05 DIAGNOSIS — N2581 Secondary hyperparathyroidism of renal origin: Secondary | ICD-10-CM | POA: Diagnosis not present

## 2020-08-05 DIAGNOSIS — D509 Iron deficiency anemia, unspecified: Secondary | ICD-10-CM | POA: Diagnosis not present

## 2020-08-05 DIAGNOSIS — Z23 Encounter for immunization: Secondary | ICD-10-CM | POA: Diagnosis not present

## 2020-08-05 DIAGNOSIS — N186 End stage renal disease: Secondary | ICD-10-CM | POA: Diagnosis not present

## 2020-08-05 DIAGNOSIS — D689 Coagulation defect, unspecified: Secondary | ICD-10-CM | POA: Diagnosis not present

## 2020-08-05 DIAGNOSIS — Z992 Dependence on renal dialysis: Secondary | ICD-10-CM | POA: Diagnosis not present

## 2020-08-05 DIAGNOSIS — D649 Anemia, unspecified: Secondary | ICD-10-CM | POA: Diagnosis not present

## 2020-08-05 DIAGNOSIS — D688 Other specified coagulation defects: Secondary | ICD-10-CM | POA: Diagnosis not present

## 2020-08-05 DIAGNOSIS — T8249XA Other complication of vascular dialysis catheter, initial encounter: Secondary | ICD-10-CM | POA: Diagnosis not present

## 2020-08-06 DIAGNOSIS — N179 Acute kidney failure, unspecified: Secondary | ICD-10-CM | POA: Diagnosis not present

## 2020-08-06 DIAGNOSIS — N186 End stage renal disease: Secondary | ICD-10-CM | POA: Diagnosis not present

## 2020-08-06 DIAGNOSIS — Z992 Dependence on renal dialysis: Secondary | ICD-10-CM | POA: Diagnosis not present

## 2020-08-07 DIAGNOSIS — J449 Chronic obstructive pulmonary disease, unspecified: Secondary | ICD-10-CM | POA: Diagnosis not present

## 2020-08-07 DIAGNOSIS — M25572 Pain in left ankle and joints of left foot: Secondary | ICD-10-CM | POA: Diagnosis not present

## 2020-08-07 DIAGNOSIS — M25472 Effusion, left ankle: Secondary | ICD-10-CM | POA: Diagnosis not present

## 2020-08-08 DIAGNOSIS — N186 End stage renal disease: Secondary | ICD-10-CM | POA: Diagnosis not present

## 2020-08-08 DIAGNOSIS — D689 Coagulation defect, unspecified: Secondary | ICD-10-CM | POA: Diagnosis not present

## 2020-08-08 DIAGNOSIS — Z992 Dependence on renal dialysis: Secondary | ICD-10-CM | POA: Diagnosis not present

## 2020-08-08 DIAGNOSIS — D688 Other specified coagulation defects: Secondary | ICD-10-CM | POA: Diagnosis not present

## 2020-08-08 DIAGNOSIS — D509 Iron deficiency anemia, unspecified: Secondary | ICD-10-CM | POA: Diagnosis not present

## 2020-08-08 DIAGNOSIS — N2581 Secondary hyperparathyroidism of renal origin: Secondary | ICD-10-CM | POA: Diagnosis not present

## 2020-08-08 DIAGNOSIS — T8249XA Other complication of vascular dialysis catheter, initial encounter: Secondary | ICD-10-CM | POA: Diagnosis not present

## 2020-08-10 ENCOUNTER — Other Ambulatory Visit: Payer: Self-pay | Admitting: *Deleted

## 2020-08-10 DIAGNOSIS — N2581 Secondary hyperparathyroidism of renal origin: Secondary | ICD-10-CM | POA: Diagnosis not present

## 2020-08-10 DIAGNOSIS — D689 Coagulation defect, unspecified: Secondary | ICD-10-CM | POA: Diagnosis not present

## 2020-08-10 DIAGNOSIS — Z992 Dependence on renal dialysis: Secondary | ICD-10-CM | POA: Diagnosis not present

## 2020-08-10 DIAGNOSIS — T8249XA Other complication of vascular dialysis catheter, initial encounter: Secondary | ICD-10-CM | POA: Diagnosis not present

## 2020-08-10 DIAGNOSIS — D688 Other specified coagulation defects: Secondary | ICD-10-CM | POA: Diagnosis not present

## 2020-08-10 DIAGNOSIS — D509 Iron deficiency anemia, unspecified: Secondary | ICD-10-CM | POA: Diagnosis not present

## 2020-08-10 DIAGNOSIS — N186 End stage renal disease: Secondary | ICD-10-CM | POA: Diagnosis not present

## 2020-08-15 ENCOUNTER — Other Ambulatory Visit: Payer: Self-pay

## 2020-08-15 ENCOUNTER — Emergency Department (HOSPITAL_COMMUNITY): Payer: Medicare Other

## 2020-08-15 ENCOUNTER — Encounter (HOSPITAL_COMMUNITY): Payer: Self-pay | Admitting: Emergency Medicine

## 2020-08-15 ENCOUNTER — Inpatient Hospital Stay (HOSPITAL_COMMUNITY)
Admission: EM | Admit: 2020-08-15 | Discharge: 2020-08-17 | DRG: 682 | Disposition: A | Discharge status: Home Health | Payer: Medicare Other | Attending: Internal Medicine | Admitting: Internal Medicine

## 2020-08-15 DIAGNOSIS — Z992 Dependence on renal dialysis: Secondary | ICD-10-CM | POA: Diagnosis not present

## 2020-08-15 DIAGNOSIS — R2681 Unsteadiness on feet: Secondary | ICD-10-CM | POA: Diagnosis not present

## 2020-08-15 DIAGNOSIS — G9341 Metabolic encephalopathy: Secondary | ICD-10-CM | POA: Diagnosis not present

## 2020-08-15 DIAGNOSIS — S2249XA Multiple fractures of ribs, unspecified side, initial encounter for closed fracture: Secondary | ICD-10-CM | POA: Diagnosis present

## 2020-08-15 DIAGNOSIS — Z825 Family history of asthma and other chronic lower respiratory diseases: Secondary | ICD-10-CM

## 2020-08-15 DIAGNOSIS — I6782 Cerebral ischemia: Secondary | ICD-10-CM | POA: Diagnosis not present

## 2020-08-15 DIAGNOSIS — E538 Deficiency of other specified B group vitamins: Secondary | ICD-10-CM | POA: Diagnosis present

## 2020-08-15 DIAGNOSIS — D631 Anemia in chronic kidney disease: Secondary | ICD-10-CM | POA: Diagnosis present

## 2020-08-15 DIAGNOSIS — E0840 Diabetes mellitus due to underlying condition with diabetic neuropathy, unspecified: Secondary | ICD-10-CM | POA: Diagnosis not present

## 2020-08-15 DIAGNOSIS — G9340 Encephalopathy, unspecified: Secondary | ICD-10-CM | POA: Diagnosis not present

## 2020-08-15 DIAGNOSIS — N19 Unspecified kidney failure: Secondary | ICD-10-CM | POA: Diagnosis not present

## 2020-08-15 DIAGNOSIS — Z87891 Personal history of nicotine dependence: Secondary | ICD-10-CM | POA: Diagnosis not present

## 2020-08-15 DIAGNOSIS — S199XXA Unspecified injury of neck, initial encounter: Secondary | ICD-10-CM | POA: Diagnosis not present

## 2020-08-15 DIAGNOSIS — S2241XA Multiple fractures of ribs, right side, initial encounter for closed fracture: Secondary | ICD-10-CM

## 2020-08-15 DIAGNOSIS — E1159 Type 2 diabetes mellitus with other circulatory complications: Secondary | ICD-10-CM

## 2020-08-15 DIAGNOSIS — Z79899 Other long term (current) drug therapy: Secondary | ICD-10-CM

## 2020-08-15 DIAGNOSIS — R9431 Abnormal electrocardiogram [ECG] [EKG]: Secondary | ICD-10-CM | POA: Diagnosis not present

## 2020-08-15 DIAGNOSIS — I120 Hypertensive chronic kidney disease with stage 5 chronic kidney disease or end stage renal disease: Secondary | ICD-10-CM | POA: Diagnosis not present

## 2020-08-15 DIAGNOSIS — Z853 Personal history of malignant neoplasm of breast: Secondary | ICD-10-CM

## 2020-08-15 DIAGNOSIS — E1151 Type 2 diabetes mellitus with diabetic peripheral angiopathy without gangrene: Secondary | ICD-10-CM | POA: Diagnosis not present

## 2020-08-15 DIAGNOSIS — I152 Hypertension secondary to endocrine disorders: Secondary | ICD-10-CM | POA: Diagnosis not present

## 2020-08-15 DIAGNOSIS — F32A Depression, unspecified: Secondary | ICD-10-CM | POA: Diagnosis present

## 2020-08-15 DIAGNOSIS — J449 Chronic obstructive pulmonary disease, unspecified: Secondary | ICD-10-CM | POA: Diagnosis not present

## 2020-08-15 DIAGNOSIS — Z9115 Patient's noncompliance with renal dialysis: Secondary | ICD-10-CM | POA: Diagnosis not present

## 2020-08-15 DIAGNOSIS — Z20822 Contact with and (suspected) exposure to covid-19: Secondary | ICD-10-CM | POA: Diagnosis present

## 2020-08-15 DIAGNOSIS — J90 Pleural effusion, not elsewhere classified: Secondary | ICD-10-CM | POA: Diagnosis not present

## 2020-08-15 DIAGNOSIS — Z8261 Family history of arthritis: Secondary | ICD-10-CM | POA: Diagnosis not present

## 2020-08-15 DIAGNOSIS — N186 End stage renal disease: Secondary | ICD-10-CM | POA: Diagnosis not present

## 2020-08-15 DIAGNOSIS — N2581 Secondary hyperparathyroidism of renal origin: Secondary | ICD-10-CM | POA: Diagnosis not present

## 2020-08-15 DIAGNOSIS — R55 Syncope and collapse: Secondary | ICD-10-CM | POA: Diagnosis not present

## 2020-08-15 DIAGNOSIS — Z743 Need for continuous supervision: Secondary | ICD-10-CM | POA: Diagnosis not present

## 2020-08-15 DIAGNOSIS — R4182 Altered mental status, unspecified: Secondary | ICD-10-CM

## 2020-08-15 DIAGNOSIS — N184 Chronic kidney disease, stage 4 (severe): Secondary | ICD-10-CM | POA: Diagnosis not present

## 2020-08-15 DIAGNOSIS — Z1389 Encounter for screening for other disorder: Secondary | ICD-10-CM

## 2020-08-15 DIAGNOSIS — E785 Hyperlipidemia, unspecified: Secondary | ICD-10-CM | POA: Diagnosis present

## 2020-08-15 DIAGNOSIS — E0849 Diabetes mellitus due to underlying condition with other diabetic neurological complication: Secondary | ICD-10-CM | POA: Diagnosis present

## 2020-08-15 DIAGNOSIS — G319 Degenerative disease of nervous system, unspecified: Secondary | ICD-10-CM | POA: Diagnosis present

## 2020-08-15 DIAGNOSIS — E1122 Type 2 diabetes mellitus with diabetic chronic kidney disease: Secondary | ICD-10-CM | POA: Diagnosis not present

## 2020-08-15 DIAGNOSIS — R262 Difficulty in walking, not elsewhere classified: Secondary | ICD-10-CM | POA: Diagnosis not present

## 2020-08-15 DIAGNOSIS — E875 Hyperkalemia: Secondary | ICD-10-CM | POA: Diagnosis not present

## 2020-08-15 DIAGNOSIS — E1169 Type 2 diabetes mellitus with other specified complication: Secondary | ICD-10-CM | POA: Diagnosis present

## 2020-08-15 DIAGNOSIS — R0602 Shortness of breath: Secondary | ICD-10-CM | POA: Diagnosis not present

## 2020-08-15 DIAGNOSIS — R197 Diarrhea, unspecified: Secondary | ICD-10-CM | POA: Diagnosis not present

## 2020-08-15 DIAGNOSIS — R609 Edema, unspecified: Secondary | ICD-10-CM | POA: Diagnosis not present

## 2020-08-15 DIAGNOSIS — Z820 Family history of epilepsy and other diseases of the nervous system: Secondary | ICD-10-CM

## 2020-08-15 LAB — COMPREHENSIVE METABOLIC PANEL
ALT: 10 U/L (ref 0–44)
AST: 16 U/L (ref 15–41)
Albumin: 3.5 g/dL (ref 3.5–5.0)
Alkaline Phosphatase: 61 U/L (ref 38–126)
Anion gap: 19 — ABNORMAL HIGH (ref 5–15)
BUN: 99 mg/dL — ABNORMAL HIGH (ref 8–23)
CO2: 15 mmol/L — ABNORMAL LOW (ref 22–32)
Calcium: 8.6 mg/dL — ABNORMAL LOW (ref 8.9–10.3)
Chloride: 102 mmol/L (ref 98–111)
Creatinine, Ser: 9.53 mg/dL — ABNORMAL HIGH (ref 0.44–1.00)
GFR, Estimated: 4 mL/min — ABNORMAL LOW (ref >60)
Glucose, Bld: 124 mg/dL — ABNORMAL HIGH (ref 70–99)
Potassium: 5.5 mmol/L — ABNORMAL HIGH (ref 3.5–5.1)
Sodium: 136 mmol/L (ref 135–145)
Total Bilirubin: 0.8 mg/dL (ref 0.3–1.2)
Total Protein: 7.1 g/dL (ref 6.5–8.1)

## 2020-08-15 LAB — CBC
HCT: 36.5 % (ref 36.0–46.0)
Hemoglobin: 10.9 g/dL — ABNORMAL LOW (ref 12.0–15.0)
MCH: 30.7 pg (ref 26.0–34.0)
MCHC: 29.9 g/dL — ABNORMAL LOW (ref 30.0–36.0)
MCV: 102.8 fL — ABNORMAL HIGH (ref 80.0–100.0)
Platelets: 201 10*3/uL (ref 150–400)
RBC: 3.55 MIL/uL — ABNORMAL LOW (ref 3.87–5.11)
RDW: 17.7 % — ABNORMAL HIGH (ref 11.5–15.5)
WBC: 5.2 10*3/uL (ref 4.0–10.5)
nRBC: 0 % (ref 0.0–0.2)

## 2020-08-15 LAB — CBG MONITORING, ED: Glucose-Capillary: 71 mg/dL (ref 70–99)

## 2020-08-15 LAB — RESPIRATORY PANEL BY RT PCR (FLU A&B, COVID)
Influenza A by PCR: NEGATIVE
Influenza B by PCR: NEGATIVE
SARS Coronavirus 2 by RT PCR: NEGATIVE

## 2020-08-15 LAB — AMMONIA: Ammonia: 35 umol/L (ref 9–35)

## 2020-08-15 MED ORDER — SODIUM ZIRCONIUM CYCLOSILICATE 10 G PO PACK
10.0000 g | PACK | Freq: Once | ORAL | Status: AC
  Administered 2020-08-15: 10 g via ORAL
  Filled 2020-08-15: qty 1

## 2020-08-15 MED ORDER — HEPARIN SODIUM (PORCINE) 5000 UNIT/ML IJ SOLN
5000.0000 [IU] | Freq: Three times a day (TID) | INTRAMUSCULAR | Status: DC
  Administered 2020-08-15 – 2020-08-17 (×5): 5000 [IU] via SUBCUTANEOUS
  Filled 2020-08-15 (×5): qty 1

## 2020-08-15 MED ORDER — CHLORHEXIDINE GLUCONATE CLOTH 2 % EX PADS
6.0000 | MEDICATED_PAD | Freq: Every day | CUTANEOUS | Status: DC
  Administered 2020-08-17: 6 via TOPICAL

## 2020-08-15 MED ORDER — ACETAMINOPHEN 650 MG RE SUPP: 650.0000 mg | Freq: Four times a day (QID) | RECTAL | Status: DC | PRN

## 2020-08-15 MED ORDER — INSULIN ASPART 100 UNIT/ML ~~LOC~~ SOLN: 0.0000 [IU] | SUBCUTANEOUS | Status: DC

## 2020-08-15 MED ORDER — INSULIN ASPART 100 UNIT/ML ~~LOC~~ SOLN: 0.0000 [IU] | Freq: Three times a day (TID) | SUBCUTANEOUS | Status: DC

## 2020-08-15 MED ORDER — ACETAMINOPHEN 325 MG PO TABS: 650.0000 mg | ORAL_TABLET | Freq: Four times a day (QID) | ORAL | Status: DC | PRN

## 2020-08-15 MED ORDER — ONDANSETRON HCL 4 MG/2ML IJ SOLN: 4.0000 mg | Freq: Four times a day (QID) | INTRAMUSCULAR | Status: DC | PRN

## 2020-08-15 MED ORDER — ONDANSETRON HCL 4 MG PO TABS: 4.0000 mg | ORAL_TABLET | Freq: Four times a day (QID) | ORAL | Status: DC | PRN

## 2020-08-15 NOTE — ED Notes (Signed)
Patient transported to CT 

## 2020-08-15 NOTE — Consult Note (Signed)
Reason for Consult: Continuity of end-stage renal disease care Referring Physician: Shirlyn Goltz MD (EDP)  HPI:  74 year old Caucasian woman with past medical history significant for breast cancer status post treatment in 2005, chronic obstructive lung disease, dyslipidemia, diabetes mellitus, depression and end-stage renal disease on hemodialysis for the past 7 months (after severe AKI resulting in papillary necrosis).  She is on hemodialysis on a TTS schedule at [facility] and last went to her scheduled hemodialysis on 08/10/2020 missing her last dialysis treatment this past Saturday and today prompting a wellness check after inability to reach her on the phone.  When found by [employer], she was in a state of altered mental status covered with feces and confused.  Upon talking to her, it is quite apparent that she appears confused and perseverates responses.  She is able to inform me that she has 2 friends who check in on her but she lives by herself.  She denies any fevers or chills and continues to make good amounts of urine but reports no hematuria or dysuria.  Hemodialysis prescription: [facility], TTS, 180 dialyzer, BFR 400/DFR 800, 2K/2 calcium, 3 hours 30 minutes, EDW 51.5 kg, heparin 2000 unit bolus, Mircera 75 mcg every 2 weeks, Venofer 100 mg 3 times weekly (stop date 11/16) and calcitriol 0.25 mcg 3 times weekly.   Past Medical History:  Diagnosis Date  . AKI (acute kidney injury) ([place]) 01/2020  . Cancer [facility]) 2005   Breast Cancer  . COPD (chronic obstructive pulmonary disease) ([place])   . Depression   . Diabetes mellitus without complication ([place])   . Hyperlipidemia     Past Surgical History:  Procedure Laterality Date  . BREAST SURGERY  2005  . CATARACT EXTRACTION    . CHOLECYSTECTOMY  2013  . IR FLUORO GUIDE CV LINE RIGHT  02/01/2020  . IR US GUIDE VASC ACCESS RIGHT  02/01/2020    Family History  Problem Relation Age of Onset  . Arthritis  Mother   . Alzheimer's disease Mother   . Dementia Father   . COPD Brother   . COPD Brother     Social History:  reports that she has quit smoking. Her smoking use included cigarettes. She has a 40.00 pack-year smoking history. She has never used smokeless tobacco. She reports that she does not drink alcohol and does not use drugs.  Allergies: No Known Allergies  Medications:  Scheduled: . [START ON 08/16/2020] Chlorhexidine Gluconate Cloth  6 each Topical Q0600    BMP Latest Ref Rng & Units 08/15/2020 04/29/2020 02/19/2020  Glucose 70 - 99 mg/dL 124(H) 160(H) 164(H)  BUN 8 - 23 mg/dL 99(H) 11 35(H)  Creatinine 0.44 - 1.00 mg/dL 9.53(H) 2.27(H) 2.91(H)  Sodium 135 - 145 mmol/L 136 133(L) 134(L)  Potassium 3.5 - 5.1 mmol/L 5.5(H) 3.8 3.8  Chloride 98 - 111 mmol/L 102 92(L) 97(L)  CO2 22 - 32 mmol/L 15(L) 30 25  Calcium 8.9 - 10.3 mg/dL 8.6(L) 7.3(L) 8.3(L)   CBC Latest Ref Rng & Units 08/15/2020 04/29/2020 02/19/2020  WBC 4.0 - 10.5 K/uL 5.2 8.9 11.6(H)  Hemoglobin 12.0 - 15.0 g/dL 10.9(L) 7.5(L) 9.3(L)  Hematocrit 36 - 46 % 36.5 24.3(L) 29.5(L)  Platelets 150 - 400 K/uL 201 141(L) 217     DG Chest Port 1 View  Result Date: 08/15/2020 CLINICAL DATA:  Shortness of breath. EXAM: PORTABLE CHEST 1 VIEW COMPARISON:  April 29, 2020. FINDINGS: Stable cardiomediastinal silhouette. No pneumothorax or pleural effusion is  noted. Right internal jugular dialysis catheter is unchanged in position. Both lungs are clear. Mildly displaced fractures are seen involving the lateral portions of the right eighth and ninth ribs. IMPRESSION: Mildly displaced right eighth and ninth rib fractures. No acute cardiopulmonary abnormality seen. Electronically Signed   By: Marijo Conception M.D.   On: 08/15/2020 19:18    Review of Systems  Unable to perform ROS: Mental status change (Notoriously unreliable review of systems)  Constitutional: Negative for chills and fever.   Blood pressure (!) 151/63, pulse 84,  temperature 97.7 F (36.5 C), temperature source Oral, resp. rate 18, height 5\' 2"  (1.575 m), weight 49.9 kg, SpO2 100 %. Physical Exam Vitals and nursing note reviewed.  Constitutional:      Appearance: She is ill-appearing.  HENT:     Head: Normocephalic and atraumatic.     Right Ear: External ear normal.     Left Ear: External ear normal.     Nose: Nose normal.  Eyes:     General: No scleral icterus.    Conjunctiva/sclera: Conjunctivae normal.     Pupils: Pupils are equal, round, and reactive to light.  Neck:     Comments: Right IJ TDC Cardiovascular:     Rate and Rhythm: Normal rate and regular rhythm.     Pulses: Normal pulses.     Heart sounds: Normal heart sounds. No murmur heard.   Pulmonary:     Effort: Pulmonary effort is normal.     Breath sounds: Rhonchi present.     Comments: Expiratory rhonchi bilaterally Abdominal:     General: Abdomen is flat. Bowel sounds are normal.     Palpations: Abdomen is soft.     Tenderness: There is no abdominal tenderness. There is no guarding.     Hernia: No hernia is present.  Musculoskeletal:        General: Normal range of motion.     Cervical back: Normal range of motion and neck supple.     Right lower leg: Edema present.     Left lower leg: Edema present.     Comments: Trace lower extremity edema bilaterally  Skin:    General: Skin is warm and dry.     Coloration: Skin is pale.     Findings: No rash.  Neurological:     Mental Status: She is alert.     Assessment/Plan: 1. Altered mental status: Unclear sequence of events given unreliable history.  It is imperative to rule out focal infection in this elderly woman specifically urinary tract infection versus respiratory tract infection in the setting of COPD as an etiology of metabolic encephalopathy/altered mental status.  Would also consider drug screen for possible toxic ingestion/polypharmacy leading up to current events.  Data does not support her encephalopathy being  uremic in nature. 2.  End-stage renal disease: With 2 missed dialysis treatments including earlier today.  We will plan for hemodialysis tomorrow due to large patient volume census at this time that would not be able to accommodate for dialysis tonight.  In my clinical opinion, she does not have acute indications for dialysis at this time. 3.  Hyperkalemia: Mild and treated earlier with [facility] by ED provider.  Managed definitively with dialysis. 4.  Hypertension: Resume oral antihypertensive therapy when mental status improves.  Monitor with ultrafiltration/dialysis. 5.  Anemia of chronic kidney disease: Hemoglobin and hematocrit within acceptable range, follow trends to decide on need for ESA/iron. 6.  Secondary hyperparathyroidism: Resume calcitriol with hemodialysis for PTH control  and follow calcium/phosphorus trend.  Zenola Dezarn K. 08/15/2020, 8:47 PM

## 2020-08-15 NOTE — ED Notes (Addendum)
Dialysis catheter intact at this time. Pt has a purewick on and no urine specimen at this time.

## 2020-08-15 NOTE — ED Triage Notes (Signed)
Patient arrives to ED via [facility] to triage with complaints of mild altered mental status and missed dialysis. Per EMS pt's dialysis center called EMS to check on pt b/c she has missed x2 weeks of treatment. EMS found pt in her own urine and feces. Pt states she doesn't remember when he last dialysis treatment was ans she was refusing to go. Pt currently alert to self, placed, and situation. Disoriented to time. No SOB or CP.

## 2020-08-15 NOTE — ED Notes (Signed)
Pt is eating graham crackers and cheese at this time.

## 2020-08-15 NOTE — ED Provider Notes (Signed)
[facility] EMERGENCY DEPARTMENT Provider Note   CSN: [id number] Arrival date & time: 08/15/20  1706     History Chief Complaint  Patient presents with  . Altered Mental Status    Cindy Burns is a 74 y.o. female hx of AKI, COPD, DM, ESRD on dialysis here presenting with altered mental status.  Patient states that she has not gone to dialysis for the last 2 weeks.  She cannot tell me why she did not go to dialysis.  Apparently dialysis center called EMS to check on her and she was noted to be covered in her own urine and feces and very disoriented.  Patient is very confused but she knows that she is in [facility] ER.  She cannot tell me why she is unable to take care of herself.  The history is provided by the patient.       Past Medical History:  Diagnosis Date  . AKI (acute kidney injury) ([place]) 01/2020  . Cancer [facility]) 2005   Breast Cancer  . COPD (chronic obstructive pulmonary disease) ([place])   . Depression   . Diabetes mellitus without complication ([place])   . Hyperlipidemia     Patient Active Problem List   Diagnosis Date Noted  . Fever 04/30/2020  . ESRD (end stage renal disease) ([place]) 04/30/2020  . Hypocalcemia 01/19/2020  . Metabolic acidosis 63/10/6008  . Acute respiratory failure with hypoxia ([place]) 01/19/2020  . UTI (urinary tract infection) 01/19/2020  . Encounter for central line placement   . Pain and swelling of ankle, left   . Pressure injury of skin 01/09/2020  . Renal failure 01/08/2020  . Acute renal failure on dialysis ([place])   . Hepatomegaly 07/30/2019  . Right carotid bruit 07/30/2019  . SIADH (syndrome of inappropriate ADH production) ([place]) 04/06/2019  . B12 deficiency 04/06/2019  . Balance problem 03/18/2019  . Ventral hernia 04/03/2017  . Hypertension associated with diabetes ([place]) 02/28/2017  . Chronic insomnia 10/26/2013  . Diabetic autonomic neuropathy ([place]) 08/12/2013  . PULMONARY NODULE 09/27/2009  . ADENOCARCINOMA, BREAST  02/28/2009  . Normocytic anemia 06/24/2008  . Moderate COPD (chronic obstructive pulmonary disease) ([place]) 03/14/2008  . Major depressive disorder, recurrent episode, moderate ([place]) 08/11/2007  . Diabetes mellitus due to underlying condition, controlled, with neurologic complication ([place]) 93/23/5573  . Hyperlipidemia associated with type 2 diabetes mellitus ([place]) 04/30/2007  . CIGARETTE SMOKER 04/22/2007    Past Surgical History:  Procedure Laterality Date  . BREAST SURGERY  2005  . CATARACT EXTRACTION    . CHOLECYSTECTOMY  2013  . IR FLUORO GUIDE CV LINE RIGHT  02/01/2020  . IR US GUIDE VASC ACCESS RIGHT  02/01/2020     OB History   No obstetric history on file.     Family History  Problem Relation Age of Onset  . Arthritis Mother   . Alzheimer's disease Mother   . Dementia Father   . COPD Brother   . COPD Brother     Social History   Tobacco Use  . Smoking status: Former Smoker    Packs/day: 1.00    Years: 40.00    Pack years: 40.00    Types: Cigarettes  . Smokeless tobacco: Never Used  Vaping Use  . Vaping Use: Never used  Substance Use Topics  . Alcohol use: No  . Drug use: No    Home Medications Prior to Admission medications   Medication Sig Start Date End Date Taking? Authorizing Provider  albuterol (VENTOLIN HFA) 108 (  90 Base) MCG/ACT inhaler TAKE 2 PUFFS BY MOUTH EVERY 6 HOURS AS NEEDED FOR WHEEZE OR SHORTNESS OF BREATH 04/24/20   Bedsole, Amy E, MD  amLODipine (NORVASC) 10 MG tablet Take 1 tablet (10 mg total) by mouth daily. 02/20/20 04/29/29  Alma Friendly, MD  furosemide (LASIX) 40 MG tablet Take 1 tablet (40 mg total) by mouth 2 (two) times daily. 02/19/20 04/29/29  Alma Friendly, MD  gabapentin (NEURONTIN) 100 MG capsule Take 1 capsule (100 mg total) by mouth at bedtime. 06/16/20   Bedsole, Amy E, MD  ipratropium-albuterol (DUONEB) 0.5-2.5 (3) MG/3ML SOLN Take 3 mLs by nebulization every 6 (six) hours as needed. 02/19/20 04/29/29  Alma Friendly, MD  metoprolol tartrate (LOPRESSOR) 25 MG tablet Take 1 tablet (25 mg total) by mouth 2 (two) times daily. 02/19/20 04/29/29  Alma Friendly, MD  rosuvastatin (CRESTOR) 20 MG tablet TAKE 1 TABLET BY MOUTH EVERY DAY 07/27/20   Bedsole, Amy E, MD  sitaGLIPtin (JANUVIA) 25 MG tablet Take 1 tablet (25 mg total) by mouth daily. 06/19/20   Bedsole, Amy E, MD  venlafaxine XR (EFFEXOR-XR) 37.5 MG 24 hr capsule TAKE 1 CAPSULE BY MOUTH DAILY WITH BREAKFAST. Patient taking differently: Take 37.5 mg by mouth daily with breakfast.  03/20/20   [name], MD    Allergies    Patient has no known allergies.  Review of Systems   Review of Systems  Psychiatric/Behavioral: Positive for confusion.  All other systems reviewed and are negative.   Physical Exam Updated Vital Signs BP (!) 152/65   Pulse 88   Temp 97.7 F (36.5 C) (Oral)   Resp (!) 21   Ht 5\' 2"  (1.575 m)   Wt 49.9 kg   SpO2 100%   BMI 20.12 kg/m   Physical Exam Vitals and nursing note reviewed.  Constitutional:      Comments: Confused, disheveled   HENT:     Head: Normocephalic.     Nose: Nose normal.     Mouth/Throat:     Mouth: Mucous membranes are dry.  Eyes:     Extraocular Movements: Extraocular movements intact.     Pupils: Pupils are equal, round, and reactive to light.  Cardiovascular:     Rate and Rhythm: Normal rate and regular rhythm.     Pulses: Normal pulses.     Heart sounds: Normal heart sounds.  Pulmonary:     Effort: Pulmonary effort is normal.     Breath sounds: Normal breath sounds.  Musculoskeletal:        General: Normal range of motion.     Cervical back: Normal range of motion and neck supple.  Skin:    General: Skin is warm.     Capillary Refill: Capillary refill takes less than 2 seconds.  Neurological:     Comments: ANO x2.  Patient has no obvious facial droop.  Normal strength and sensation bilaterally  Psychiatric:        Mood and Affect: Mood normal.     ED Results  / Procedures / Treatments   Labs (all labs ordered are listed, but only abnormal results are displayed) Labs Reviewed  COMPREHENSIVE METABOLIC PANEL - Abnormal; Notable for the following components:      Result Value   Potassium 5.5 (*)    CO2 15 (*)    Glucose, Bld 124 (*)    BUN 99 (*)    Creatinine, Ser 9.53 (*)    Calcium 8.6 (*)  GFR, Estimated 4 (*)    Anion gap 19 (*)    All other components within normal limits  CBC - Abnormal; Notable for the following components:   RBC 3.55 (*)    Hemoglobin 10.9 (*)    MCV 102.8 (*)    MCHC 29.9 (*)    RDW 17.7 (*)    All other components within normal limits  CBG MONITORING, ED    EKG EKG Interpretation  Date/Time:  Tuesday August 15 2020 19:04:32 EST Ventricular Rate:  89 PR Interval:    QRS Duration: 107 QT Interval:  371 QTC Calculation: 452 R Axis:   77 Text Interpretation: Normal sinus rhythm Nonspecific T abnrm, anterolateral leads Confirmed by Wandra Arthurs [phone]) on 08/15/2020 7:10:59 PM   Radiology DG Chest Port 1 View  Result Date: 08/15/2020 CLINICAL DATA:  Shortness of breath. EXAM: PORTABLE CHEST 1 VIEW COMPARISON:  April 29, 2020. FINDINGS: Stable cardiomediastinal silhouette. No pneumothorax or pleural effusion is noted. Right internal jugular dialysis catheter is unchanged in position. Both lungs are clear. Mildly displaced fractures are seen involving the lateral portions of the right eighth and ninth ribs. IMPRESSION: Mildly displaced right eighth and ninth rib fractures. No acute cardiopulmonary abnormality seen. Electronically Signed   By: Marijo Conception M.D.   On: 08/15/2020 19:18    Procedures Procedures (including critical care time)  Medications Ordered in ED Medications  sodium zirconium cyclosilicate (LOKELMA) packet 10 g (10 g Oral Given 08/15/20 1907)    ED Course  I have reviewed the triage vital signs and the nursing notes.  Pertinent labs & imaging results that were available during  my care of the patient were reviewed by me and considered in my medical decision making (see chart for details).    MDM Rules/Calculators/A&P                         Cindy Burns is a 74 y.o. female here presenting with altered mental status.  Patient has not gone to dialysis for 2 weeks.  Patient appears confused.  She is also covered in feces and urine at home.  She appears very disheveled.  Will get CT head and chest x-ray and labs.  9:05 PM Labs showed creatinine of 9 and potassium of 5.5.  Her CT head and neck were unremarkable.  Her chest x-ray showed possible 2 rib fractures.  However on her exam there is no bruising on exam.  Patient states that she does fall often but not recently.  I wonder if she had old rib fractures.  I called nephrology to get her dialyzed.  Hospitalist to admit since she is very confused.  I suspect uremia as the main cause of her altered mental status   Final Clinical Impression(s) / ED Diagnoses Final diagnoses:  None    Rx / DC Orders ED Discharge Orders    None       Drenda Freeze, MD 08/15/20 2106

## 2020-08-15 NOTE — H&P (Signed)
History and Physical    Cindy Burns KXF:[id number] DOB: 1945-10-22 DOA: 08/15/2020  PCP: [name], MD  Patient coming from: Home  I have personally briefly reviewed patient's old medical records in [place]  Chief Complaint: AMS, found down  HPI: Cindy Burns is a 74 y.o. female with medical history significant of ESRD, COPD, DM2, HTN.  Pt last went to HD on 08/10/20, missed Sat and today.  This prompted a wellness check after they were unable to reach her on phone.  Pt found by [employer], reportedly with AMS and confusion.  Pt states she was just "too weak" to get up and go to dialysis.  She denies confusion to me.  Denies fevers, chills, pain (anywhere), recent fall (does fall frequently though), dysuria.   ED Course: BUN 99, creat 9.53.  CXR shows 2 rib FX on R side.  K 5.5.  Ammonia 35.  Pt is actually alert and oriented X3 for me at the moment, is able to correctly even tell me it is November 9th.  Review of Systems: As per HPI, otherwise all review of systems negative.  Past Medical History:  Diagnosis Date  . AKI (acute kidney injury) ([place]) 01/2020  . Cancer [facility]) 2005   Breast Cancer  . COPD (chronic obstructive pulmonary disease) ([place])   . Depression   . Diabetes mellitus without complication ([place])   . Hyperlipidemia     Past Surgical History:  Procedure Laterality Date  . BREAST SURGERY  2005  . CATARACT EXTRACTION    . CHOLECYSTECTOMY  2013  . IR FLUORO GUIDE CV LINE RIGHT  02/01/2020  . IR US GUIDE VASC ACCESS RIGHT  02/01/2020     reports that she has quit smoking. Her smoking use included cigarettes. She has a 40.00 pack-year smoking history. She has never used smokeless tobacco. She reports that she does not drink alcohol and does not use drugs.  No Known Allergies  Family History  Problem Relation Age of Onset  . Arthritis Mother   . Alzheimer's disease Mother   . Dementia Father   . COPD Brother   . COPD Brother       Prior to Admission medications   Medication Sig Start Date End Date Taking? Authorizing Provider  albuterol (VENTOLIN HFA) 108 (90 Base) MCG/ACT inhaler TAKE 2 PUFFS BY MOUTH EVERY 6 HOURS AS NEEDED FOR WHEEZE OR SHORTNESS OF BREATH 04/24/20   Bedsole, Amy E, MD  amLODipine (NORVASC) 10 MG tablet Take 1 tablet (10 mg total) by mouth daily. 02/20/20 04/29/29  Alma Friendly, MD  furosemide (LASIX) 40 MG tablet Take 1 tablet (40 mg total) by mouth 2 (two) times daily. 02/19/20 04/29/29  Alma Friendly, MD  gabapentin (NEURONTIN) 100 MG capsule Take 1 capsule (100 mg total) by mouth at bedtime. 06/16/20   Bedsole, Amy E, MD  ipratropium-albuterol (DUONEB) 0.5-2.5 (3) MG/3ML SOLN Take 3 mLs by nebulization every 6 (six) hours as needed. 02/19/20 04/29/29  Alma Friendly, MD  metoprolol tartrate (LOPRESSOR) 25 MG tablet Take 1 tablet (25 mg total) by mouth 2 (two) times daily. 02/19/20 04/29/29  Alma Friendly, MD  rosuvastatin (CRESTOR) 20 MG tablet TAKE 1 TABLET BY MOUTH EVERY DAY 07/27/20   Bedsole, Amy E, MD  sitaGLIPtin (JANUVIA) 25 MG tablet Take 1 tablet (25 mg total) by mouth daily. 06/19/20   Bedsole, Amy E, MD  venlafaxine XR (EFFEXOR-XR) 37.5 MG 24 hr capsule TAKE 1 CAPSULE BY MOUTH  DAILY WITH BREAKFAST. Patient taking differently: Take 37.5 mg by mouth daily with breakfast.  03/20/20   [name], MD    Physical Exam: Vitals:   08/15/20 1900 08/15/20 1900 08/15/20 2000 08/15/20 2130  BP: (!) 152/65  (!) 151/63 (!) 148/59  Pulse: 88  84 96  Resp: (!) 21  18 (!) 24  Temp:      TempSrc:      SpO2: 100%  100% 95%  Weight:  49.9 kg    Height:  5\' 2"  (1.575 m)      Constitutional: NAD, calm, comfortable Eyes: PERRL, lids and conjunctivae normal ENMT: Mucous membranes are moist. Posterior pharynx clear of any exudate or lesions.Normal dentition.  Neck: normal, supple, no masses, no thyromegaly Respiratory: B rhonchi. Cardiovascular: Regular rate and rhythm, no  murmurs / rubs / gallops. 1+ BLE edema.. 2+ pedal pulses. No carotid bruits.  Abdomen: no tenderness, no masses palpated. No hepatosplenomegaly. Bowel sounds positive.  Musculoskeletal: no clubbing / cyanosis. No joint deformity upper and lower extremities. Good ROM, no contractures. Normal muscle tone.  Skin: no rashes, lesions, ulcers. No induration Neurologic: CN 2-12 grossly intact. Sensation intact, DTR normal. Strength 5/5 in all 4.  Psychiatric: Normal judgment and insight. Alert and oriented x 3. For me. Normal mood.    Labs on Admission: I have personally reviewed following labs and imaging studies  CBC: Recent Labs  Lab 08/15/20 1724  WBC 5.2  HGB 10.9*  HCT 36.5  MCV 102.8*  PLT 585   Basic Metabolic Panel: Recent Labs  Lab 08/15/20 1724  NA 136  K 5.5*  CL 102  CO2 15*  GLUCOSE 124*  BUN 99*  CREATININE 9.53*  CALCIUM 8.6*   GFR: Estimated Creatinine Clearance: 4.2 mL/min (A) (by C-G formula based on SCr of 9.53 mg/dL (H)). Liver Function Tests: Recent Labs  Lab 08/15/20 1724  AST 16  ALT 10  ALKPHOS 61  BILITOT 0.8  PROT 7.1  ALBUMIN 3.5   No results for input(s): LIPASE, AMYLASE in the last 168 hours. Recent Labs  Lab 08/15/20 2117  AMMONIA 35   Coagulation Profile: No results for input(s): INR, PROTIME in the last 168 hours. Cardiac Enzymes: No results for input(s): CKTOTAL, CKMB, CKMBINDEX, TROPONINI in the last 168 hours. BNP (last 3 results) No results for input(s): PROBNP in the last 8760 hours. HbA1C: No results for input(s): HGBA1C in the last 72 hours. CBG: Recent Labs  Lab 08/15/20 2128  GLUCAP 71   Lipid Profile: No results for input(s): CHOL, HDL, LDLCALC, TRIG, CHOLHDL, LDLDIRECT in the last 72 hours. Thyroid Function Tests: No results for input(s): TSH, T4TOTAL, FREET4, T3FREE, THYROIDAB in the last 72 hours. Anemia Panel: No results for input(s): VITAMINB12, FOLATE, FERRITIN, TIBC, IRON, RETICCTPCT in the last 72  hours. Urine analysis:    Component Value Date/Time   COLORURINE YELLOW 04/30/2020 0143   APPEARANCEUR HAZY (A) 04/30/2020 0143   LABSPEC 1.006 04/30/2020 0143   PHURINE 9.0 (H) 04/30/2020 0143   GLUCOSEU NEGATIVE 04/30/2020 0143   HGBUR MODERATE (A) 04/30/2020 0143   HGBUR trace-intact 06/24/2008 0912   BILIRUBINUR NEGATIVE 04/30/2020 0143   BILIRUBINUR negative 09/23/2013 1559   KETONESUR NEGATIVE 04/30/2020 0143   PROTEINUR 100 (A) 04/30/2020 0143   UROBILINOGEN 0.2 09/23/2013 1559   UROBILINOGEN 0.2 06/24/2008 0912   NITRITE NEGATIVE 04/30/2020 0143   LEUKOCYTESUR LARGE (A) 04/30/2020 0143    Radiological Exams on Admission: CT Head Wo Contrast  Result Date:  08/15/2020 CLINICAL DATA:  Encephalopathy.  Found down. EXAM: CT HEAD WITHOUT CONTRAST CT CERVICAL SPINE WITHOUT CONTRAST TECHNIQUE: Multidetector CT imaging of the head and cervical spine was performed following the standard protocol without intravenous contrast. Multiplanar CT image reconstructions of the cervical spine were also generated. COMPARISON:  None. FINDINGS: CT HEAD FINDINGS Brain: There is no mass, hemorrhage or extra-axial collection. There is generalized atrophy without lobar predilection. There is hypoattenuation of the periventricular white matter, most commonly indicating chronic ischemic microangiopathy. Vascular: Atherosclerotic calcification of the internal carotid arteries at the skull base. No abnormal hyperdensity of the major intracranial arteries or dural venous sinuses. Skull: The visualized skull base, calvarium and extracranial soft tissues are normal. Sinuses/Orbits: No fluid levels or advanced mucosal thickening of the visualized paranasal sinuses. No mastoid or middle ear effusion. The orbits are normal. CT CERVICAL SPINE FINDINGS Alignment: No static subluxation. Facets are aligned. Occipital condyles are normally positioned. Skull base and vertebrae: No acute fracture. Soft tissues and spinal canal:  No prevertebral fluid or swelling. No visible canal hematoma. Disc levels: No advanced spinal canal or neural foraminal stenosis. Upper chest: No pneumothorax, pulmonary nodule or pleural effusion. Other: Cervical spine study is degraded by motion. IMPRESSION: 1. Chronic ischemic microangiopathy and generalized atrophy without acute intracranial abnormality. 2. Motion degraded cervical spine images without visible acute fracture or static subluxation. Electronically Signed   By: Ulyses Jarred M.D.   On: 08/15/2020 20:58   CT Cervical Spine Wo Contrast  Result Date: 08/15/2020 CLINICAL DATA:  Encephalopathy.  Found down. EXAM: CT HEAD WITHOUT CONTRAST CT CERVICAL SPINE WITHOUT CONTRAST TECHNIQUE: Multidetector CT imaging of the head and cervical spine was performed following the standard protocol without intravenous contrast. Multiplanar CT image reconstructions of the cervical spine were also generated. COMPARISON:  None. FINDINGS: CT HEAD FINDINGS Brain: There is no mass, hemorrhage or extra-axial collection. There is generalized atrophy without lobar predilection. There is hypoattenuation of the periventricular white matter, most commonly indicating chronic ischemic microangiopathy. Vascular: Atherosclerotic calcification of the internal carotid arteries at the skull base. No abnormal hyperdensity of the major intracranial arteries or dural venous sinuses. Skull: The visualized skull base, calvarium and extracranial soft tissues are normal. Sinuses/Orbits: No fluid levels or advanced mucosal thickening of the visualized paranasal sinuses. No mastoid or middle ear effusion. The orbits are normal. CT CERVICAL SPINE FINDINGS Alignment: No static subluxation. Facets are aligned. Occipital condyles are normally positioned. Skull base and vertebrae: No acute fracture. Soft tissues and spinal canal: No prevertebral fluid or swelling. No visible canal hematoma. Disc levels: No advanced spinal canal or neural foraminal  stenosis. Upper chest: No pneumothorax, pulmonary nodule or pleural effusion. Other: Cervical spine study is degraded by motion. IMPRESSION: 1. Chronic ischemic microangiopathy and generalized atrophy without acute intracranial abnormality. 2. Motion degraded cervical spine images without visible acute fracture or static subluxation. Electronically Signed   By: Ulyses Jarred M.D.   On: 08/15/2020 20:58   DG Chest Port 1 View  Result Date: 08/15/2020 CLINICAL DATA:  Shortness of breath. EXAM: PORTABLE CHEST 1 VIEW COMPARISON:  April 29, 2020. FINDINGS: Stable cardiomediastinal silhouette. No pneumothorax or pleural effusion is noted. Right internal jugular dialysis catheter is unchanged in position. Both lungs are clear. Mildly displaced fractures are seen involving the lateral portions of the right eighth and ninth ribs. IMPRESSION: Mildly displaced right eighth and ninth rib fractures. No acute cardiopulmonary abnormality seen. Electronically Signed   By: Marijo Conception M.D.   On:  08/15/2020 19:18    EKG: Independently reviewed.  Assessment/Plan Principal Problem:   Acute metabolic encephalopathy Active Problems:   Diabetes mellitus due to underlying condition, controlled, with neurologic complication ([place])   Hypertension associated with diabetes ([place])   ESRD (end stage renal disease) ([place])   Closed traumatic minimally displaced fracture of two ribs    1. Acute metabolic encephalopathy - 1. UA pending 2. CT head neg 3. See if this persists after dialysis tomorrow 4. For me at the moment she is AAOx3, even getting the date correct. 5. PT/OT 2. Hyperkalemia - mild 1. Got Lokelma 2. Tele monitor 3. Dialysis in AM 3. DM2 - 1. Very sensitive SSI AC 2. Hold home Jardiance 4. HTN - 1. Cont home BP meds when med rec completed (pt not sure of meds, says to call [facility]). 5. ESRD - 1. Dialysis in AM 2. See Dr. Posey Pronto note 6. 2 rib FX - 1. IS 2. But ? If these arnt more chronic  fractures 3. No CP 4. No bruising  DVT prophylaxis: Heparin Lane Code Status: Full Family Communication: No family in room Disposition Plan: Home after dialysis, AMS remains resolved, and PT/OT clears Consults called: Nephrology Dr. Posey Pronto Admission status: Place in obs   Mycah Formica, [place] Hospitalists  How to contact the [facility] Attending or Consulting provider [place] or covering provider during after hours [place], for this patient?  1. Check the care team in [facility] and look for a) attending/consulting [facility] provider listed and b) the [facility] team listed 2. Log into [email or web address]  Amion Physician Scheduling and messaging for groups and whole hospitals  On call and physician scheduling software for group practices, residents, hospitalists and other medical providers for call, clinic, rotation and shift schedules. OnCall [employer] is a hospital-wide system for scheduling doctors and paging doctors on call. EasyPlot is for scientific plotting and data analysis.  [email or web address]  and use [place]'s universal password to access. If you do not have the password, please contact the hospital operator.  3. Locate the [facility] provider you are looking for under [facility] and page to a number that you can be directly reached. 4. If you still have difficulty reaching the provider, please page the [facility] (Director on Call) for the Hospitalists listed on amion for assistance.  08/15/2020, 10:36 PM

## 2020-08-16 ENCOUNTER — Inpatient Hospital Stay (HOSPITAL_COMMUNITY): Payer: Medicare Other

## 2020-08-16 DIAGNOSIS — Z992 Dependence on renal dialysis: Secondary | ICD-10-CM | POA: Diagnosis not present

## 2020-08-16 DIAGNOSIS — Z87891 Personal history of nicotine dependence: Secondary | ICD-10-CM | POA: Diagnosis not present

## 2020-08-16 DIAGNOSIS — G9341 Metabolic encephalopathy: Secondary | ICD-10-CM

## 2020-08-16 DIAGNOSIS — R2681 Unsteadiness on feet: Secondary | ICD-10-CM | POA: Diagnosis present

## 2020-08-16 DIAGNOSIS — Z820 Family history of epilepsy and other diseases of the nervous system: Secondary | ICD-10-CM | POA: Diagnosis not present

## 2020-08-16 DIAGNOSIS — M47816 Spondylosis without myelopathy or radiculopathy, lumbar region: Secondary | ICD-10-CM | POA: Diagnosis not present

## 2020-08-16 DIAGNOSIS — R410 Disorientation, unspecified: Secondary | ICD-10-CM | POA: Diagnosis not present

## 2020-08-16 DIAGNOSIS — E875 Hyperkalemia: Secondary | ICD-10-CM | POA: Diagnosis not present

## 2020-08-16 DIAGNOSIS — Z79899 Other long term (current) drug therapy: Secondary | ICD-10-CM | POA: Diagnosis not present

## 2020-08-16 DIAGNOSIS — Z8261 Family history of arthritis: Secondary | ICD-10-CM | POA: Diagnosis not present

## 2020-08-16 DIAGNOSIS — Z9049 Acquired absence of other specified parts of digestive tract: Secondary | ICD-10-CM | POA: Diagnosis not present

## 2020-08-16 DIAGNOSIS — E1151 Type 2 diabetes mellitus with diabetic peripheral angiopathy without gangrene: Secondary | ICD-10-CM | POA: Diagnosis present

## 2020-08-16 DIAGNOSIS — I120 Hypertensive chronic kidney disease with stage 5 chronic kidney disease or end stage renal disease: Secondary | ICD-10-CM | POA: Diagnosis not present

## 2020-08-16 DIAGNOSIS — E538 Deficiency of other specified B group vitamins: Secondary | ICD-10-CM | POA: Diagnosis not present

## 2020-08-16 DIAGNOSIS — J449 Chronic obstructive pulmonary disease, unspecified: Secondary | ICD-10-CM | POA: Diagnosis present

## 2020-08-16 DIAGNOSIS — E1122 Type 2 diabetes mellitus with diabetic chronic kidney disease: Secondary | ICD-10-CM | POA: Diagnosis present

## 2020-08-16 DIAGNOSIS — E785 Hyperlipidemia, unspecified: Secondary | ICD-10-CM | POA: Diagnosis present

## 2020-08-16 DIAGNOSIS — Z825 Family history of asthma and other chronic lower respiratory diseases: Secondary | ICD-10-CM | POA: Diagnosis not present

## 2020-08-16 DIAGNOSIS — G319 Degenerative disease of nervous system, unspecified: Secondary | ICD-10-CM | POA: Diagnosis not present

## 2020-08-16 DIAGNOSIS — N186 End stage renal disease: Secondary | ICD-10-CM | POA: Diagnosis not present

## 2020-08-16 DIAGNOSIS — R109 Unspecified abdominal pain: Secondary | ICD-10-CM | POA: Diagnosis not present

## 2020-08-16 DIAGNOSIS — Z853 Personal history of malignant neoplasm of breast: Secondary | ICD-10-CM | POA: Diagnosis not present

## 2020-08-16 DIAGNOSIS — E1159 Type 2 diabetes mellitus with other circulatory complications: Secondary | ICD-10-CM | POA: Diagnosis not present

## 2020-08-16 DIAGNOSIS — Z9115 Patient's noncompliance with renal dialysis: Secondary | ICD-10-CM | POA: Diagnosis not present

## 2020-08-16 DIAGNOSIS — D631 Anemia in chronic kidney disease: Secondary | ICD-10-CM | POA: Diagnosis present

## 2020-08-16 DIAGNOSIS — N19 Unspecified kidney failure: Secondary | ICD-10-CM | POA: Diagnosis present

## 2020-08-16 DIAGNOSIS — F32A Depression, unspecified: Secondary | ICD-10-CM | POA: Diagnosis present

## 2020-08-16 DIAGNOSIS — Z20822 Contact with and (suspected) exposure to covid-19: Secondary | ICD-10-CM | POA: Diagnosis present

## 2020-08-16 DIAGNOSIS — N2581 Secondary hyperparathyroidism of renal origin: Secondary | ICD-10-CM | POA: Diagnosis not present

## 2020-08-16 DIAGNOSIS — E0840 Diabetes mellitus due to underlying condition with diabetic neuropathy, unspecified: Secondary | ICD-10-CM

## 2020-08-16 DIAGNOSIS — E1169 Type 2 diabetes mellitus with other specified complication: Secondary | ICD-10-CM | POA: Diagnosis present

## 2020-08-16 DIAGNOSIS — I6782 Cerebral ischemia: Secondary | ICD-10-CM | POA: Diagnosis not present

## 2020-08-16 DIAGNOSIS — M160 Bilateral primary osteoarthritis of hip: Secondary | ICD-10-CM | POA: Diagnosis not present

## 2020-08-16 LAB — BASIC METABOLIC PANEL
Anion gap: 17 — ABNORMAL HIGH (ref 5–15)
BUN: 101 mg/dL — ABNORMAL HIGH (ref 8–23)
CO2: 17 mmol/L — ABNORMAL LOW (ref 22–32)
Calcium: 7.9 mg/dL — ABNORMAL LOW (ref 8.9–10.3)
Chloride: 102 mmol/L (ref 98–111)
Creatinine, Ser: 9.41 mg/dL — ABNORMAL HIGH (ref 0.44–1.00)
GFR, Estimated: 4 mL/min — ABNORMAL LOW (ref >60)
Glucose, Bld: 121 mg/dL — ABNORMAL HIGH (ref 70–99)
Potassium: 5.7 mmol/L — ABNORMAL HIGH (ref 3.5–5.1)
Sodium: 136 mmol/L (ref 135–145)

## 2020-08-16 LAB — CBC
HCT: 30.3 % — ABNORMAL LOW (ref 36.0–46.0)
Hemoglobin: 9.4 g/dL — ABNORMAL LOW (ref 12.0–15.0)
MCH: 30.7 pg (ref 26.0–34.0)
MCHC: 31 g/dL (ref 30.0–36.0)
MCV: 99 fL (ref 80.0–100.0)
Platelets: 183 10*3/uL (ref 150–400)
RBC: 3.06 MIL/uL — ABNORMAL LOW (ref 3.87–5.11)
RDW: 17.6 % — ABNORMAL HIGH (ref 11.5–15.5)
WBC: 3.9 10*3/uL — ABNORMAL LOW (ref 4.0–10.5)
nRBC: 0 % (ref 0.0–0.2)

## 2020-08-16 LAB — URINALYSIS, ROUTINE W REFLEX MICROSCOPIC
Bilirubin Urine: NEGATIVE
Glucose, UA: 50 mg/dL — AB
Ketones, ur: NEGATIVE mg/dL
Nitrite: NEGATIVE
Protein, ur: 100 mg/dL — AB
Specific Gravity, Urine: 1.006 (ref 1.005–1.030)
WBC, UA: >50 WBC/hpf — ABNORMAL HIGH (ref 0–5)
pH: 6 (ref 5.0–8.0)

## 2020-08-16 LAB — RAPID URINE DRUG SCREEN, HOSP PERFORMED
Amphetamines: NOT DETECTED
Barbiturates: NOT DETECTED
Benzodiazepines: NOT DETECTED
Cocaine: NOT DETECTED
Opiates: NOT DETECTED
Tetrahydrocannabinol: NOT DETECTED

## 2020-08-16 LAB — FOLATE: Folate: 4.7 ng/mL — ABNORMAL LOW (ref >5.9)

## 2020-08-16 LAB — GLUCOSE, CAPILLARY
Glucose-Capillary: 90 mg/dL (ref 70–99)
Glucose-Capillary: 134 mg/dL — ABNORMAL HIGH (ref 70–99)
Glucose-Capillary: 139 mg/dL — ABNORMAL HIGH (ref 70–99)

## 2020-08-16 LAB — CBG MONITORING, ED: Glucose-Capillary: 84 mg/dL (ref 70–99)

## 2020-08-16 LAB — VITAMIN B12: Vitamin B-12: 223 pg/mL (ref 180–914)

## 2020-08-16 MED ORDER — AMLODIPINE BESYLATE 10 MG PO TABS
10.0000 mg | ORAL_TABLET | Freq: Every day | ORAL | Status: DC
  Administered 2020-08-16 – 2020-08-17 (×2): 10 mg via ORAL
  Filled 2020-08-16 (×2): qty 1

## 2020-08-16 MED ORDER — METOPROLOL TARTRATE 25 MG PO TABS
25.0000 mg | ORAL_TABLET | Freq: Two times a day (BID) | ORAL | Status: DC
  Administered 2020-08-16 – 2020-08-17 (×2): 25 mg via ORAL
  Filled 2020-08-16 (×2): qty 1

## 2020-08-16 MED ORDER — HEPARIN SODIUM (PORCINE) 1000 UNIT/ML IJ SOLN
INTRAMUSCULAR | Status: AC
  Filled 2020-08-16: qty 4

## 2020-08-16 NOTE — ED Notes (Signed)
Transported to dialysis.

## 2020-08-16 NOTE — Progress Notes (Addendum)
PROGRESS NOTE    Cindy Burns   YBO:[id number]  DOB: [birth year]  DOA: 08/15/2020 PCP: [name], MD   Brief Narrative:  Cindy Burns is a 74 y/o female with ESRD, DM, HTN, COPD who presents from home for confusion. She lives alone with 6 dogs and had missed dialysis x 2 this past week. When her friend went to check on her, she would not open the door for fear that her dogs would run out. EMS was called by the dialysis center. She was found at home in urine and feces. In the ED she was disoriented to time but not to place or situation. She was admitted to work up her confusion. Renal did not feel that she was uremic.   Subjective: Appears confused to me. Does not know the month or year. I have asked her a few times what caused her to miss dialysis and she is behaving as if she cannot hear me. She has no complaints currently.     Assessment & Plan:   Principal Problem:   Acute metabolic encephalopathy -She is trying to speak but at times seems she is having trouble getting out her words- answers in one word answers-  I am unsure of her baseline- the friend I am able to contact today states she has not seen her in about 1 wk - she does state that she is intermittently confused - CT head > Chronic ischemic microangiopathy and generalized atrophy  - UDS negative - check vit B12, RPR and MRI brain - UA + for WBC but rare bacteria- I cannot tell if she is symptomatic but no fever (temp 99.1 today) or leukocytosis- WBC actually low today at 3.9 - will request a Neuro eval   Active Problems: Elevated MCV - checking B12- add Folate level as well    Diabetes mellitus due to underlying condition, controlled, with neurologic complication  - on Januvia at home - A1c was 4.1 on 06/16/20 -cont SSI for now     Hypertension  - on Amlodipine, Metoprolol and Lasix BID per home med list - if MRI negative for CVA, will resume meds - Addendum: MRI negative- resumed antihypertensives     Missed dialysis x 2 and hyperkalemia with ESRD  SHPT - hyperkalemia due to missed dialysis- given Lokelma - has been dialyzed and is now ready to go back to her normal dialysis schedule per nephrology    Closed traumatic minimally displaced fracture of two ribs - ? Chronic- no acute pain  Time spent in minutes: 45 DVT prophylaxis: heparin injection 5,000 Units Start: 08/15/20 2200  Code Status: Full code Family Communication: spoke with the friend (libby) that is listed as her contact- Called niece Chrisy - no response, called other friend/neighbor- no response Disposition Plan:  Status is: Inpatient  Remains inpatient appropriate because:Altered mental status   Dispo: The patient is from: Home              Anticipated d/c is to: TBD              Anticipated d/c date is: 2 days              Patient currently is not medically stable to d/c.      Consultants:   nephro  neuro Procedures:   none Antimicrobials:  Anti-infectives (From admission, onward)   None       Objective: Vitals:   08/16/20 1120 08/16/20 1135 08/16/20 1150 08/16/20 1239  BP: [name])  148/78 140/78 (!) 177/74 (!) 156/74  Pulse: (!) 103 94 95 98  Resp: 20 (!) 25 (!) 21 19  Temp:   99.1 F (37.3 C) 97.7 F (36.5 C)  TempSrc:   Oral Oral  SpO2: 100% 92% 98% 99%  Weight:      Height:        Intake/Output Summary (Last 24 hours) at 08/16/2020 1325 Last data filed at 08/16/2020 1150 Gross per 24 hour  Intake --  Output 3350 ml  Net -3350 ml   Filed Weights   08/15/20 1722 08/15/20 1900  Weight: 49.9 kg 49.9 kg    Examination: General exam: Appears comfortable  HEENT: PERRLA, oral mucosa moist, no sclera icterus or thrush Respiratory system: Clear to auscultation. Respiratory effort normal. Cardiovascular system: S1 & S2 heard, RRR.   Gastrointestinal system: Abdomen soft, non-tender, nondistended. Normal bowel sounds. Central nervous system: Alert and oriented only to person and place-  following commands. Appears to be having trouble with some word finding. No focal neurological deficits. Extremities: No cyanosis, clubbing or edema Skin: No rashes or ulcers Psychiatry:  Flat affect     Data Reviewed: I have personally reviewed following labs and imaging studies  CBC: Recent Labs  Lab 08/15/20 1724 08/16/20 0313  WBC 5.2 3.9*  HGB 10.9* 9.4*  HCT 36.5 30.3*  MCV 102.8* 99.0  PLT 201 814   Basic Metabolic Panel: Recent Labs  Lab 08/15/20 1724 08/16/20 0313  NA 136 136  K 5.5* 5.7*  CL 102 102  CO2 15* 17*  GLUCOSE 124* 121*  BUN 99* 101*  CREATININE 9.53* 9.41*  CALCIUM 8.6* 7.9*   GFR: Estimated Creatinine Clearance: 4.3 mL/min (A) (by C-G formula based on SCr of 9.41 mg/dL (H)). Liver Function Tests: Recent Labs  Lab 08/15/20 1724  AST 16  ALT 10  ALKPHOS 61  BILITOT 0.8  PROT 7.1  ALBUMIN 3.5   No results for input(s): LIPASE, AMYLASE in the last 168 hours. Recent Labs  Lab 08/15/20 2117  AMMONIA 35   Coagulation Profile: No results for input(s): INR, PROTIME in the last 168 hours. Cardiac Enzymes: No results for input(s): CKTOTAL, CKMB, CKMBINDEX, TROPONINI in the last 168 hours. BNP (last 3 results) No results for input(s): PROBNP in the last 8760 hours. HbA1C: No results for input(s): HGBA1C in the last 72 hours. CBG: Recent Labs  Lab 08/15/20 2128 08/16/20 0650 08/16/20 1242  GLUCAP 71 84 90   Lipid Profile: No results for input(s): CHOL, HDL, LDLCALC, TRIG, CHOLHDL, LDLDIRECT in the last 72 hours. Thyroid Function Tests: No results for input(s): TSH, T4TOTAL, FREET4, T3FREE, THYROIDAB in the last 72 hours. Anemia Panel: No results for input(s): VITAMINB12, FOLATE, FERRITIN, TIBC, IRON, RETICCTPCT in the last 72 hours. Urine analysis:    Component Value Date/Time   COLORURINE YELLOW 08/16/2020 0513   APPEARANCEUR CLOUDY (A) 08/16/2020 0513   LABSPEC 1.006 08/16/2020 0513   PHURINE 6.0 08/16/2020 0513   GLUCOSEU  50 (A) 08/16/2020 0513   HGBUR SMALL (A) 08/16/2020 0513   HGBUR trace-intact 06/24/2008 0912   BILIRUBINUR NEGATIVE 08/16/2020 0513   BILIRUBINUR negative 09/23/2013 1559   KETONESUR NEGATIVE 08/16/2020 0513   PROTEINUR 100 (A) 08/16/2020 0513   UROBILINOGEN 0.2 09/23/2013 1559   UROBILINOGEN 0.2 06/24/2008 0912   NITRITE NEGATIVE 08/16/2020 0513   LEUKOCYTESUR LARGE (A) 08/16/2020 0513   Sepsis Labs: @LABRCNTIP (procalcitonin:4,lacticidven:4) ) Recent Results (from the past 240 hour(s))  Respiratory Panel by RT PCR (Flu A&B, Covid) -  Nasopharyngeal Swab     Status: None   Collection Time: 08/15/20 10:41 PM   Specimen: Nasopharyngeal Swab  Result Value Ref Range Status   SARS Coronavirus 2 by RT PCR NEGATIVE NEGATIVE Final    Comment: (NOTE) SARS-CoV-2 target nucleic acids are NOT DETECTED.  The SARS-CoV-2 RNA is generally detectable in upper respiratoy specimens during the acute phase of infection. The lowest concentration of SARS-CoV-2 viral copies this assay can detect is 131 copies/mL. A negative result does not preclude SARS-Cov-2 infection and should not be used as the sole basis for treatment or other patient management decisions. A negative result may occur with  improper specimen collection/handling, submission of specimen other than nasopharyngeal swab, presence of viral mutation(s) within the areas targeted by this assay, and inadequate number of viral copies (<131 copies/mL). A negative result must be combined with clinical observations, patient history, and epidemiological information. The expected result is Negative.  Fact Sheet for Patients:  PinkCheek.be  Fact Sheet for Healthcare Providers:  GravelBags.it  This test is no t yet approved or cleared by the [place] FDA and  has been authorized for detection and/or diagnosis of SARS-CoV-2 by FDA under an Emergency Use Authorization (EUA). This EUA  will remain  in effect (meaning this test can be used) for the duration of the COVID-19 declaration under Section 564(b)(1) of the Act, 21 U.S.C. section 360bbb-3(b)(1), unless the authorization is terminated or revoked sooner.     Influenza A by PCR NEGATIVE NEGATIVE Final   Influenza B by PCR NEGATIVE NEGATIVE Final    Comment: (NOTE) The Xpert Xpress SARS-CoV-2/FLU/RSV assay is intended as an aid in  the diagnosis of influenza from Nasopharyngeal swab specimens and  should not be used as a sole basis for treatment. Nasal washings and  aspirates are unacceptable for Xpert Xpress SARS-CoV-2/FLU/RSV  testing.  Fact Sheet for Patients: PinkCheek.be  Fact Sheet for Healthcare Providers: GravelBags.it  This test is not yet approved or cleared by the [place] FDA and  has been authorized for detection and/or diagnosis of SARS-CoV-2 by  FDA under an Emergency Use Authorization (EUA). This EUA will remain  in effect (meaning this test can be used) for the duration of the  Covid-19 declaration under Section 564(b)(1) of the Act, 21  U.S.C. section 360bbb-3(b)(1), unless the authorization is  terminated or revoked. Performed at [facility], [place] [address], [place], [place] [address]          Radiology Studies: CT Head Wo Contrast  Result Date: 08/15/2020 CLINICAL DATA:  Encephalopathy.  Found down. EXAM: CT HEAD WITHOUT CONTRAST CT CERVICAL SPINE WITHOUT CONTRAST TECHNIQUE: Multidetector CT imaging of the head and cervical spine was performed following the standard protocol without intravenous contrast. Multiplanar CT image reconstructions of the cervical spine were also generated. COMPARISON:  None. FINDINGS: CT HEAD FINDINGS Brain: There is no mass, hemorrhage or extra-axial collection. There is generalized atrophy without lobar predilection. There is hypoattenuation of the periventricular white matter, most  commonly indicating chronic ischemic microangiopathy. Vascular: Atherosclerotic calcification of the internal carotid arteries at the skull base. No abnormal hyperdensity of the major intracranial arteries or dural venous sinuses. Skull: The visualized skull base, calvarium and extracranial soft tissues are normal. Sinuses/Orbits: No fluid levels or advanced mucosal thickening of the visualized paranasal sinuses. No mastoid or middle ear effusion. The orbits are normal. CT CERVICAL SPINE FINDINGS Alignment: No static subluxation. Facets are aligned. Occipital condyles are normally positioned. Skull base and vertebrae:  No acute fracture. Soft tissues and spinal canal: No prevertebral fluid or swelling. No visible canal hematoma. Disc levels: No advanced spinal canal or neural foraminal stenosis. Upper chest: No pneumothorax, pulmonary nodule or pleural effusion. Other: Cervical spine study is degraded by motion. IMPRESSION: 1. Chronic ischemic microangiopathy and generalized atrophy without acute intracranial abnormality. 2. Motion degraded cervical spine images without visible acute fracture or static subluxation. Electronically Signed   By: Ulyses Jarred M.D.   On: 08/15/2020 20:58   CT Cervical Spine Wo Contrast  Result Date: 08/15/2020 CLINICAL DATA:  Encephalopathy.  Found down. EXAM: CT HEAD WITHOUT CONTRAST CT CERVICAL SPINE WITHOUT CONTRAST TECHNIQUE: Multidetector CT imaging of the head and cervical spine was performed following the standard protocol without intravenous contrast. Multiplanar CT image reconstructions of the cervical spine were also generated. COMPARISON:  None. FINDINGS: CT HEAD FINDINGS Brain: There is no mass, hemorrhage or extra-axial collection. There is generalized atrophy without lobar predilection. There is hypoattenuation of the periventricular white matter, most commonly indicating chronic ischemic microangiopathy. Vascular: Atherosclerotic calcification of the internal carotid  arteries at the skull base. No abnormal hyperdensity of the major intracranial arteries or dural venous sinuses. Skull: The visualized skull base, calvarium and extracranial soft tissues are normal. Sinuses/Orbits: No fluid levels or advanced mucosal thickening of the visualized paranasal sinuses. No mastoid or middle ear effusion. The orbits are normal. CT CERVICAL SPINE FINDINGS Alignment: No static subluxation. Facets are aligned. Occipital condyles are normally positioned. Skull base and vertebrae: No acute fracture. Soft tissues and spinal canal: No prevertebral fluid or swelling. No visible canal hematoma. Disc levels: No advanced spinal canal or neural foraminal stenosis. Upper chest: No pneumothorax, pulmonary nodule or pleural effusion. Other: Cervical spine study is degraded by motion. IMPRESSION: 1. Chronic ischemic microangiopathy and generalized atrophy without acute intracranial abnormality. 2. Motion degraded cervical spine images without visible acute fracture or static subluxation. Electronically Signed   By: Ulyses Jarred M.D.   On: 08/15/2020 20:58   DG Chest Port 1 View  Result Date: 08/15/2020 CLINICAL DATA:  Shortness of breath. EXAM: PORTABLE CHEST 1 VIEW COMPARISON:  April 29, 2020. FINDINGS: Stable cardiomediastinal silhouette. No pneumothorax or pleural effusion is noted. Right internal jugular dialysis catheter is unchanged in position. Both lungs are clear. Mildly displaced fractures are seen involving the lateral portions of the right eighth and ninth ribs. IMPRESSION: Mildly displaced right eighth and ninth rib fractures. No acute cardiopulmonary abnormality seen. Electronically Signed   By: Marijo Conception M.D.   On: 08/15/2020 19:18      Scheduled Meds: . Chlorhexidine Gluconate Cloth  6 each Topical Q0600  . heparin  5,000 Units Subcutaneous Q8H  . heparin sodium (porcine)      . insulin aspart  0-6 Units Subcutaneous TID WC   Continuous Infusions:   LOS: 0 days       Debbe Odea, MD [facility] Pager: [email or web address] 08/16/2020, 1:25 PM

## 2020-08-16 NOTE — Evaluation (Signed)
Physical Therapy Evaluation Patient Details Name: Cindy Burns MRN: [id number] DOB: 02-Jan-1946 Today's Date: 08/16/2020   History of Present Illness  Pt is a 74 y.o. M with significant PMH of ESRD, DM, HTN, COPD who presents from home for confusion after missed dialysis x 2 this past week. Also found to have closed traumatic minimally displaced fractures of two ribs. CT head showing chronic ischemic microangiopathy and generalized atrophy without  Clinical Impression  Prior to admission, pt lives alone, is independent ADL's/IADL's and uses a cane. Pt presents with decreased functional mobility secondary to weakness, balance deficits, decreased cognition. Pt not oriented to time or situation; requires cues for problem solving and demonstrates decreased awareness of safety/deficits. Pt requiring min assist for functional mobility. Ambulating 80 feet with a walker. Presents as a high fall risk in context of decreased gait speed, history of falls, and deficits listed above. Recommending SNF at discharge.     Follow Up Recommendations SNF;Supervision/Assistance - 24 hour    Equipment Recommendations  None recommended by PT    Recommendations for Other Services       Precautions / Restrictions Precautions Precautions: Fall Restrictions Weight Bearing Restrictions: No      Mobility  Bed Mobility Overal bed mobility: Needs Assistance Bed Mobility: Supine to Sit     Supine to sit: Mod assist     General bed mobility comments: ModA for trunk to upright, use of bed pad to scoot hips forward. Limited by decreased cognition    Transfers Overall transfer level: Needs assistance Equipment used: Rolling walker (2 wheeled) Transfers: Sit to/from Stand Sit to Stand: Min assist         General transfer comment: MinA to rise from edge of bed, cues for hand placement  Ambulation/Gait Ambulation/Gait assistance: Min assist Gait Distance (Feet): 80 Feet Assistive device: Rolling  walker (2 wheeled) Gait Pattern/deviations: Step-through pattern;Decreased stride length;Narrow base of support;Trunk flexed;Drifts right/left Gait velocity: decreased Gait velocity interpretation: <1.31 ft/sec, indicative of household ambulator General Gait Details: Cues for walker proximity, manual assist provided for maintaining walker on a straight path  Stairs            Wheelchair Mobility    Modified Rankin (Stroke Patients Only)       Balance Overall balance assessment: Needs assistance Sitting-balance support: Feet supported Sitting balance-Leahy Scale: Fair     Standing balance support: Bilateral upper extremity supported Standing balance-Leahy Scale: Poor                               Pertinent Vitals/Pain Pain Assessment: No/denies pain    Home Living Family/patient expects to be discharged to:: Private residence Living Arrangements: Alone Available Help at Discharge: Friend(s);Available PRN/intermittently Type of Home: Mobile home         Home Equipment: Kasandra Knudsen - single point;Walker - 4 wheels      Prior Function Level of Independence: Needs assistance   Gait / Transfers Assistance Needed: reports using cane, driving to dialysis  ADL's / Homemaking Assistance Needed: independent ADL's/IADL's        Hand Dominance        Extremity/Trunk Assessment   Upper Extremity Assessment Upper Extremity Assessment: Overall WFL for tasks assessed    Lower Extremity Assessment Lower Extremity Assessment: Generalized weakness    Cervical / Trunk Assessment Cervical / Trunk Assessment: Kyphotic  Communication   Communication: No difficulties  Cognition Arousal/Alertness: Awake/alert Behavior During Therapy: WFL for  tasks assessed/performed Overall Cognitive Status: Impaired/Different from baseline Area of Impairment: Orientation;Memory;Safety/judgement;Awareness;Problem solving                 Orientation Level: Disoriented  to;Time;Situation   Memory: Decreased short-term memory   Safety/Judgement: Decreased awareness of safety;Decreased awareness of deficits Awareness: Intellectual Problem Solving: Slow processing;Difficulty sequencing;Requires verbal cues General Comments: Pt not oriented to time. At times, is very slow to respond and has difficulty with expressive speech or getting words out. Does not recall why she is here. When putting a gown around her backside, pt stating, " I remember that."      General Comments      Exercises     Assessment/Plan    PT Assessment Patient needs continued PT services  PT Problem List Decreased strength;Decreased balance;Decreased activity tolerance;Decreased mobility;Decreased cognition;Decreased safety awareness       PT Treatment Interventions DME instruction;Gait training;Stair training;Functional mobility training;Therapeutic activities;Therapeutic exercise;Balance training;Patient/family education    PT Goals (Current goals can be found in the Care Plan section)  Acute Rehab PT Goals Patient Stated Goal: did not state PT Goal Formulation: With patient Time For Goal Achievement: 08/30/20 Potential to Achieve Goals: Good    Frequency Min 2X/week   Barriers to discharge        Co-evaluation               AM-PAC PT "6 Clicks" Mobility  Outcome Measure Help needed turning from your back to your side while in a flat bed without using bedrails?: A Little Help needed moving from lying on your back to sitting on the side of a flat bed without using bedrails?: A Lot Help needed moving to and from a bed to a chair (including a wheelchair)?: A Little Help needed standing up from a chair using your arms (e.g., wheelchair or bedside chair)?: A Little Help needed to walk in hospital room?: A Little Help needed climbing 3-5 steps with a railing? : A Lot 6 Click Score: 16    End of Session Equipment Utilized During Treatment: Gait belt Activity  Tolerance: Patient tolerated treatment well Patient left: in chair;with call bell/phone within reach;with chair alarm set Nurse Communication: Mobility status PT Visit Diagnosis: Unsteadiness on feet (R26.81);Muscle weakness (generalized) (M62.81);Difficulty in walking, not elsewhere classified (R26.2)    Time: 8832-5498 PT Time Calculation (min) (ACUTE ONLY): 25 min   Charges:   PT Evaluation $PT Eval Moderate Complexity: 1 Mod PT Treatments $Gait Training: 8-22 mins        Wyona Almas, PT, DPT Acute Rehabilitation Services Pager [phone] Office [phone]   Deno Etienne 08/16/2020, 4:53 PM

## 2020-08-16 NOTE — Progress Notes (Signed)
[facility] Progress Note   Subjective:   Seen at initiation of dialysis.  Says she was confused but feels better; unable to describe what happened.  Objective Vitals:   08/16/20 0515 08/16/20 0545 08/16/20 0600 08/16/20 0630  BP: (!) 147/64 (!) 158/68 (!) 145/55 (!) 162/71  Pulse: 95 94 95 (!) 101  Resp: (!) 25 (!) 27 (!) 27 (!) 22  Temp:      TempSrc:      SpO2: 100% 99% 99% 93%  Weight:      Height:       Physical Exam General: lying comfortably on stretcher Heart: RRR Lungs: clear Abdomen:soft Extremities: no edema Neuro:  Awake, alert and oriented to self, [facility], Joe Biden but not month or year. Dialysis Access: [facility] c/d/i   Additional Objective Labs: Basic Metabolic Panel: Recent Labs  Lab 08/15/20 1724 08/16/20 0313  NA 136 136  K 5.5* 5.7*  CL 102 102  CO2 15* 17*  GLUCOSE 124* 121*  BUN 99* 101*  CREATININE 9.53* 9.41*  CALCIUM 8.6* 7.9*   Liver Function Tests: Recent Labs  Lab 08/15/20 1724  AST 16  ALT 10  ALKPHOS 61  BILITOT 0.8  PROT 7.1  ALBUMIN 3.5   No results for input(s): LIPASE, AMYLASE in the last 168 hours. CBC: Recent Labs  Lab 08/15/20 1724 08/16/20 0313  WBC 5.2 3.9*  HGB 10.9* 9.4*  HCT 36.5 30.3*  MCV 102.8* 99.0  PLT 201 183   Blood Culture    Component Value Date/Time   SDES URINE, CATHETERIZED 04/30/2020 0143   SPECREQUEST  04/30/2020 0143    NONE Performed at [facility] [address], [place], [place] [address]    CULT >=100,000 COLONIES/mL ESCHERICHIA COLI (A) 04/30/2020 0143   REPTSTATUS 05/02/2020 FINAL 04/30/2020 0143    Cardiac Enzymes: No results for input(s): CKTOTAL, CKMB, CKMBINDEX, TROPONINI in the last 168 hours. CBG: Recent Labs  Lab 08/15/20 2128 08/16/20 0650  GLUCAP 71 84   Iron Studies: No results for input(s): IRON, TIBC, TRANSFERRIN, FERRITIN in the last 72 hours. @lablastinr3 @ Studies/Results: CT Head Wo Contrast  Result Date: 08/15/2020 CLINICAL  DATA:  Encephalopathy.  Found down. EXAM: CT HEAD WITHOUT CONTRAST CT CERVICAL SPINE WITHOUT CONTRAST TECHNIQUE: Multidetector CT imaging of the head and cervical spine was performed following the standard protocol without intravenous contrast. Multiplanar CT image reconstructions of the cervical spine were also generated. COMPARISON:  None. FINDINGS: CT HEAD FINDINGS Brain: There is no mass, hemorrhage or extra-axial collection. There is generalized atrophy without lobar predilection. There is hypoattenuation of the periventricular white matter, most commonly indicating chronic ischemic microangiopathy. Vascular: Atherosclerotic calcification of the internal carotid arteries at the skull base. No abnormal hyperdensity of the major intracranial arteries or dural venous sinuses. Skull: The visualized skull base, calvarium and extracranial soft tissues are normal. Sinuses/Orbits: No fluid levels or advanced mucosal thickening of the visualized paranasal sinuses. No mastoid or middle ear effusion. The orbits are normal. CT CERVICAL SPINE FINDINGS Alignment: No static subluxation. Facets are aligned. Occipital condyles are normally positioned. Skull base and vertebrae: No acute fracture. Soft tissues and spinal canal: No prevertebral fluid or swelling. No visible canal hematoma. Disc levels: No advanced spinal canal or neural foraminal stenosis. Upper chest: No pneumothorax, pulmonary nodule or pleural effusion. Other: Cervical spine study is degraded by motion. IMPRESSION: 1. Chronic ischemic microangiopathy and generalized atrophy without acute intracranial abnormality. 2. Motion degraded cervical spine images without visible acute  fracture or static subluxation. Electronically Signed   By: Ulyses Jarred M.D.   On: 08/15/2020 20:58   CT Cervical Spine Wo Contrast  Result Date: 08/15/2020 CLINICAL DATA:  Encephalopathy.  Found down. EXAM: CT HEAD WITHOUT CONTRAST CT CERVICAL SPINE WITHOUT CONTRAST TECHNIQUE:  Multidetector CT imaging of the head and cervical spine was performed following the standard protocol without intravenous contrast. Multiplanar CT image reconstructions of the cervical spine were also generated. COMPARISON:  None. FINDINGS: CT HEAD FINDINGS Brain: There is no mass, hemorrhage or extra-axial collection. There is generalized atrophy without lobar predilection. There is hypoattenuation of the periventricular white matter, most commonly indicating chronic ischemic microangiopathy. Vascular: Atherosclerotic calcification of the internal carotid arteries at the skull base. No abnormal hyperdensity of the major intracranial arteries or dural venous sinuses. Skull: The visualized skull base, calvarium and extracranial soft tissues are normal. Sinuses/Orbits: No fluid levels or advanced mucosal thickening of the visualized paranasal sinuses. No mastoid or middle ear effusion. The orbits are normal. CT CERVICAL SPINE FINDINGS Alignment: No static subluxation. Facets are aligned. Occipital condyles are normally positioned. Skull base and vertebrae: No acute fracture. Soft tissues and spinal canal: No prevertebral fluid or swelling. No visible canal hematoma. Disc levels: No advanced spinal canal or neural foraminal stenosis. Upper chest: No pneumothorax, pulmonary nodule or pleural effusion. Other: Cervical spine study is degraded by motion. IMPRESSION: 1. Chronic ischemic microangiopathy and generalized atrophy without acute intracranial abnormality. 2. Motion degraded cervical spine images without visible acute fracture or static subluxation. Electronically Signed   By: Ulyses Jarred M.D.   On: 08/15/2020 20:58   DG Chest Port 1 View  Result Date: 08/15/2020 CLINICAL DATA:  Shortness of breath. EXAM: PORTABLE CHEST 1 VIEW COMPARISON:  April 29, 2020. FINDINGS: Stable cardiomediastinal silhouette. No pneumothorax or pleural effusion is noted. Right internal jugular dialysis catheter is unchanged in  position. Both lungs are clear. Mildly displaced fractures are seen involving the lateral portions of the right eighth and ninth ribs. IMPRESSION: Mildly displaced right eighth and ninth rib fractures. No acute cardiopulmonary abnormality seen. Electronically Signed   By: Marijo Conception M.D.   On: 08/15/2020 19:18   Medications:  . Chlorhexidine Gluconate Cloth  6 each Topical Q0600  . heparin  5,000 Units Subcutaneous Q8H  . insulin aspart  0-6 Units Subcutaneous TID WC    Dialysis Orders: Hemodialysis prescription: [facility], TTS, 180 dialyzer, BFR 400/DFR 800, 2K/2 calcium, 3 hours 30 minutes, EDW 51.5 kg, heparin 2000 unit bolus, Mircera 75 mcg every 2 weeks, Venofer 100 mg 3 times weekly (stop date 11/16) and calcitriol 0.25 mcg 3 times weekly.  Assessment/Plan: 1. Altered mental status: W/u ingestions, infection per primary.  Course not consistent with uremia.  2.  End-stage renal disease: TTS - HD make up today for missed tx then will resume outpt schedule at some point prior to discharge pending availability per HD unit census. 3.  Hyperkalemia: Mild and treated earlier with [facility] by ED provider.  Managed definitively with dialysis today. 4.  Hypertension: Resume oral antihypertensive therapy now that mental status is improved.  Monitor with ultrafiltration/dialysis. 5.  Anemia of chronic kidney disease: Hemoglobin and hematocrit within acceptable range, follow trends to decide on need for ESA/iron. 6.  Secondary hyperparathyroidism: Resumed calcitriol with hemodialysis for PTH control and follow calcium/phosphorus trend.   Jannifer Hick MD 08/16/2020, 7:37 AM  [facility] Pager: [phone]

## 2020-08-16 NOTE — Progress Notes (Signed)
Addendum to consult note: MRI resulted and no acute findings. Mild cerebral atrophy, chronic small vessel ischemic disease.  Clance Boll, NP/Hunter Theda Sers, MD

## 2020-08-16 NOTE — ED Notes (Signed)
Pt attempted to get out of bed. Changed bed linens and assisted pt back to bed. Pt stated, " The [place] [employer] are carving on [place]. They are planning on the diopsy." Pt is oriented to self, place, and time.

## 2020-08-16 NOTE — Consult Note (Signed)
Neurology consult H&P  CC: Neuro called for consult due to onfusion/encepalopathy  History is obtained from:patient and attending MD  HPI: Cindy Burns is a 74 y.o. female with a PMHx as below. She has ESRD and goes to HD 3 x a week. Pt admits to skipping last 2 HD appts because she was a "bad girl" and just "didn't feel like going". Per attending, pt had trouble finding her words this a.m. and was generally confused. At the time of attending's exam, pt was only oriented x place. Attending ordered MRI brain which is still pending at time of this note. Pt had HD today and nephrology stated that encephalopathy was not due to uremia. Her creatinine is still elevated at 9.41 which is down from 9.53 yesterday. Her k is stable at 5.5 and 5.7  ROS: A complete ROS was performed and is negative except as noted in the HPI. Pt states she feels improved since coming to hospital.   Past Medical History:  Diagnosis Date  . AKI (acute kidney injury) ([place]) 01/2020  . Cancer [facility]) 2005   Breast Cancer  . COPD (chronic obstructive pulmonary disease) ([place])   . Depression   . Diabetes mellitus without complication ([place])   . Hyperlipidemia      Family History  Problem Relation Age of Onset  . Arthritis Mother   . Alzheimer's disease Mother   . Dementia Father   . COPD Brother   . COPD Brother     Social History:  reports that she has quit smoking. Her smoking use included cigarettes. She has a 40.00 pack-year smoking history. She has never used smokeless tobacco. She reports that she does not drink alcohol and does not use drugs.   Prior to Admission medications   Medication Sig Start Date End Date Taking? Authorizing Provider  albuterol (VENTOLIN HFA) 108 (90 Base) MCG/ACT inhaler TAKE 2 PUFFS BY MOUTH EVERY 6 HOURS AS NEEDED FOR WHEEZE OR SHORTNESS OF BREATH Patient taking differently: Inhale 1 puff into the lungs every 6 (six) hours as needed for wheezing or shortness of breath.  04/24/20  Yes  Bedsole, Amy E, MD  amLODipine (NORVASC) 10 MG tablet Take 1 tablet (10 mg total) by mouth daily. 02/20/20 04/29/29 Yes Alma Friendly, MD  gabapentin (NEURONTIN) 100 MG capsule Take 1 capsule (100 mg total) by mouth at bedtime. 06/16/20  Yes Bedsole, Amy E, MD  ipratropium-albuterol (DUONEB) 0.5-2.5 (3) MG/3ML SOLN Take 3 mLs by nebulization every 6 (six) hours as needed. Patient taking differently: Take 3 mLs by nebulization every 6 (six) hours as needed (For shortness ob breath).  02/19/20 04/29/29 Yes Alma Friendly, MD  rosuvastatin (CRESTOR) 20 MG tablet TAKE 1 TABLET BY MOUTH EVERY DAY 07/27/20  Yes Bedsole, Amy E, MD  sitaGLIPtin (JANUVIA) 25 MG tablet Take 1 tablet (25 mg total) by mouth daily. 06/19/20  Yes Bedsole, Amy E, MD  venlafaxine XR (EFFEXOR-XR) 37.5 MG 24 hr capsule TAKE 1 CAPSULE BY MOUTH DAILY WITH BREAKFAST. Patient taking differently: Take 37.5 mg by mouth daily with breakfast.  03/20/20  Yes Bedsole, Amy E, MD  furosemide (LASIX) 40 MG tablet Take 1 tablet (40 mg total) by mouth 2 (two) times daily. Patient not taking: Reported on 08/16/2020 02/19/20 04/29/29  Alma Friendly, MD  metoprolol tartrate (LOPRESSOR) 25 MG tablet Take 1 tablet (25 mg total) by mouth 2 (two) times daily. Patient not taking: Reported on 08/16/2020 02/19/20 04/29/29  Alma Friendly, MD  Exam: Current vital signs: BP (!) 156/74   Pulse 98   Temp 97.7 F (36.5 C) (Oral)   Resp 19   Ht 5\' 2"  (1.575 m)   Wt 49.9 kg   SpO2 99%   BMI 20.12 kg/m   Physical Exam  Constitutional: Appears well-developed and well-nourished.  Psych: Affect appropriate to situation Eyes: No scleral injection HENT: No OP obstrucion Head: Normocephalic.   Neuro: Mental Status: Patient is awake, alert, oriented to person, place, month, year, and situation. She recites why she ended up in hospital.  Patient is able to give a clear and coherent history. Motor: Tone is normal. Bulk is normal. 5/5  strength was present in all four extremities.  Sensory: Sensation is symmetric to light touch and temperature in the arms and legs.  Deep Tendon Reflexes: 2+ and symmetric in the biceps and patellae.   I have reviewed labs in epic and the pertinent results are: as in HPI.   I have reviewed the images obtained: MRI brain is pending.  Primary Diagnosis: Acute Metabolic encephalopathy. Appears to be clearing given today's mental exam.  Secondary Diagnosis: Non compliance with HD.    Assessment: Cindy Burns is a 74 y.o. female PMHx of ESRD with non compliance with HD. Noted to have metabolic encephalopathy which was worse on admission. This seems to be clearing. No neurological findings for etiology of this.   Impression: acute metabolic encephalopathy.  Plan: - MRI brain without contrast.  Electronically signed by: Clance Boll, MSN, APN-BC, nurse practitioner Pager: [phone] 08/16/2020, 5:09 PM

## 2020-08-17 ENCOUNTER — Telehealth: Payer: Self-pay

## 2020-08-17 DIAGNOSIS — N19 Unspecified kidney failure: Principal | ICD-10-CM

## 2020-08-17 DIAGNOSIS — E1122 Type 2 diabetes mellitus with diabetic chronic kidney disease: Secondary | ICD-10-CM

## 2020-08-17 DIAGNOSIS — E538 Deficiency of other specified B group vitamins: Secondary | ICD-10-CM

## 2020-08-17 DIAGNOSIS — N184 Chronic kidney disease, stage 4 (severe): Secondary | ICD-10-CM

## 2020-08-17 LAB — BASIC METABOLIC PANEL
Anion gap: 10 (ref 5–15)
BUN: 28 mg/dL — ABNORMAL HIGH (ref 8–23)
CO2: 25 mmol/L (ref 22–32)
Calcium: 8.7 mg/dL — ABNORMAL LOW (ref 8.9–10.3)
Chloride: 103 mmol/L (ref 98–111)
Creatinine, Ser: 4.42 mg/dL — ABNORMAL HIGH (ref 0.44–1.00)
GFR, Estimated: 10 mL/min — ABNORMAL LOW (ref >60)
Glucose, Bld: 86 mg/dL (ref 70–99)
Potassium: 4.1 mmol/L (ref 3.5–5.1)
Sodium: 138 mmol/L (ref 135–145)

## 2020-08-17 LAB — CBC
HCT: 31.7 % — ABNORMAL LOW (ref 36.0–46.0)
Hemoglobin: 9.9 g/dL — ABNORMAL LOW (ref 12.0–15.0)
MCH: 29.9 pg (ref 26.0–34.0)
MCHC: 31.2 g/dL (ref 30.0–36.0)
MCV: 95.8 fL (ref 80.0–100.0)
Platelets: 183 10*3/uL (ref 150–400)
RBC: 3.31 MIL/uL — ABNORMAL LOW (ref 3.87–5.11)
RDW: 17.1 % — ABNORMAL HIGH (ref 11.5–15.5)
WBC: 4.4 10*3/uL (ref 4.0–10.5)
nRBC: 0 % (ref 0.0–0.2)

## 2020-08-17 LAB — GLUCOSE, CAPILLARY
Glucose-Capillary: 116 mg/dL — ABNORMAL HIGH (ref 70–99)
Glucose-Capillary: 116 mg/dL — ABNORMAL HIGH (ref 70–99)

## 2020-08-17 LAB — RPR: RPR Ser Ql: NONREACTIVE

## 2020-08-17 MED ORDER — FOLIC ACID 1 MG PO TABS: 1.0000 mg | ORAL_TABLET | Freq: Every day | ORAL | 3 refills | Status: AC

## 2020-08-17 MED ORDER — CYANOCOBALAMIN 1000 MCG/ML IJ SOLN: 1000.0000 ug | Freq: Once | INTRAMUSCULAR | Status: DC

## 2020-08-17 MED ORDER — METOPROLOL TARTRATE 25 MG PO TABS: 25.0000 mg | ORAL_TABLET | Freq: Two times a day (BID) | ORAL | 0 refills | Status: DC

## 2020-08-17 MED ORDER — VITAMIN B-12 1000 MCG PO TABS: 1000.0000 ug | ORAL_TABLET | Freq: Every day | ORAL | 0 refills | Status: DC

## 2020-08-17 NOTE — TOC Transition Note (Signed)
Transition of Care [facility]) - CM/SW Discharge Note   Patient Details  Name: ONYX EDGLEY MRN: [id number] Date of Birth: May 12, 1946  Transition of Care [facility]) CM/SW Contact:  Joanne Chars, LCSW Phone Number: 08/17/2020, 2:02 PM   Clinical Narrative:   Pt discharging home today.  Friend coming to pick her up.  HH arranged through [facility].  No equipment needs.  Readmission activities completed, appt with PCP on 11/19.  No other needs identified.     Final next level of care: [place] Barriers to Discharge: Barriers Resolved   Patient Goals and CMS Choice Patient states their goals for this hospitalization and ongoing recovery are:: "walk like I used to" CMS Medicare.gov Compare Post Acute Care list provided to::  (pt has worked with [facility] before and would like to choose them again)    Discharge Placement                       Discharge Plan and Services     Post Acute Care Choice: Home Health          DME Arranged: N/A         HH Arranged: PT, OT, Nurse's Aide [place] Agency: [place] Date [place]: 08/17/20 Time HH Agency Contacted: 1330 Representative spoke with at [place]: [place] ([place]) Interventions     Readmission Risk Interventions Readmission Risk Prevention Plan 08/17/2020  Transportation Screening Complete  PCP or Specialist Appt within 3-5 Days Not Complete  Not Complete comments First available appt with PCP 11/19  [place] or [place] Complete  Social Work Consult for [place] Planning/Counseling Complete  Palliative Care Screening Not Applicable  Some recent data might be hidden

## 2020-08-17 NOTE — Discharge Instructions (Signed)
Your Vitamin B12 and Folate levels are very low. Please take the replacements every day. Please refill your Metoprolol and resume taking it as your BP is high.   You were cared for by a hospitalist during your hospital stay. If you have any questions about your discharge medications or the care you received while you were in the hospital after you are discharged, you can call the unit and asked to speak with the hospitalist on call if the hospitalist that took care of you is not available. Once you are discharged, your primary care physician will handle any further medical issues.   Please note that NO REFILLS for any discharge medications will be authorized once you are discharged, as it is imperative that you return to your primary care physician (or establish a relationship with a primary care physician if you do not have one) for your aftercare needs so that they can reassess your need for medications and monitor your lab values.  Please take all your medications with you for your next visit with your Primary MD. Please ask your Primary MD to get all Hospital records sent to his/her office. Please request your Primary MD to go over all hospital test results at the follow up.   If you experience worsening of your admission symptoms, develop shortness of breath, chest pain, suicidal or homicidal thoughts or a life threatening emergency, you must seek medical attention immediately by calling 911 or calling your MD.   Dennis Bast must read the complete instructions/literature along with all the possible adverse reactions/side effects for all the medicines you take including new medications that have been prescribed to you. Take new medicines after you have completely understood and accpet all the possible adverse reactions/side effects.    Do not drive when taking pain medications or sedatives.     Do not take more than prescribed Pain, Sleep and Anxiety Medications   If you have smoked or chewed Tobacco  in the last 2 yrs please stop. Stop any regular alcohol  and or recreational drug use.   Wear Seat belts while driving.

## 2020-08-17 NOTE — TOC Initial Note (Signed)
Transition of Care [facility]) - Initial/Assessment Note    Patient Details  Name: Cindy Burns MRN: [id number] Date of Birth: November 09, 1945  Transition of Care [facility]) CM/SW Contact:    Joanne Chars, LCSW Phone Number: 08/17/2020, 11:29 AM  Clinical Narrative:   CSW met with pt to discuss discharge plan.  Pt is not willing to go to SNF.  Pt reports she lives alone with quite a few dogs and cats and needs to get home to take care of them.  Discussed recommendations for 24 hour supervision and pt is aware but still declines SNF recommendation.  Pt has worked with [facility] in the past and would want to see if they could provide services again.  Pt is vaccinated for covid.  Equipment, currently has: rolling walker, cane, bedside commode.              Expected Discharge Plan: [place] Barriers to Discharge: Continued Medical Work up   Patient Goals and CMS Choice Patient states their goals for this hospitalization and ongoing recovery are:: "walk like I used to" CMS Medicare.gov Compare Post Acute Care list provided to::  (pt has worked with [facility] before and would like to choose them again)    Expected Discharge Plan and Services Expected Discharge Plan: [place] Choice: [place] arrangements for the past 2 months: Single Family Home                                      Prior Living Arrangements/Services Living arrangements for the past 2 months: Single Family Home Lives with:: Self Patient language and need for interpreter reviewed:: Yes Do you feel safe going back to the place where you live?: Yes      Need for Family Participation in Patient Care: No (Comment) Care giver support system in place?: Yes (comment) (pt reports several friends are supportive) Current home services: Other (comment) (none) Criminal Activity/Legal Involvement Pertinent to Current Situation/Hospitalization: No - Comment as needed   Activities of Daily Living      Permission Sought/Granted Permission sought to share information with : Family Supports Permission granted to share information with : Yes, Verbal Permission Granted (permission not given to speak with supports)     Permission granted to share info w AGENCY: [facility]        Emotional Assessment Appearance:: Appears stated age Attitude/Demeanor/Rapport: Engaged Affect (typically observed): Appropriate Orientation: : Oriented to Self Alcohol / Substance Use: Not Applicable Psych Involvement: No (comment)  Admission diagnosis:  Hyperkalemia [E87.5] Uremia [N19] Altered mental status, unspecified altered mental status type [X38.18] Acute metabolic encephalopathy [E99.37] Patient Active Problem List   Diagnosis Date Noted  . Acute metabolic encephalopathy 16/96/7893  . Closed traumatic minimally displaced fracture of two ribs 08/15/2020  . Fever 04/30/2020  . ESRD (end stage renal disease) ([place]) 04/30/2020  . Hypocalcemia 01/19/2020  . Metabolic acidosis 81/10/7508  . Acute respiratory failure with hypoxia ([place]) 01/19/2020  . UTI (urinary tract infection) 01/19/2020  . Encounter for central line placement   . Pain and swelling of ankle, left   . Pressure injury of skin 01/09/2020  . Renal failure 01/08/2020  . Acute renal failure on dialysis ([place])   . Hepatomegaly 07/30/2019  . Right carotid bruit 07/30/2019  . SIADH (syndrome of inappropriate ADH production) ([place]) 04/06/2019  . B12  deficiency 04/06/2019  . Balance problem 03/18/2019  . Ventral hernia 04/03/2017  . Hypertension associated with diabetes ([place]) 02/28/2017  . Chronic insomnia 10/26/2013  . Diabetic autonomic neuropathy ([place]) 08/12/2013  . PULMONARY NODULE 09/27/2009  . ADENOCARCINOMA, BREAST 02/28/2009  . Normocytic anemia 06/24/2008  . Moderate COPD (chronic obstructive pulmonary disease) ([place]) 03/14/2008  . Major depressive disorder, recurrent episode, moderate ([place])  08/11/2007  . Diabetes mellitus due to underlying condition, controlled, with neurologic complication ([place]) 85/11/7739  . Hyperlipidemia associated with type 2 diabetes mellitus ([place]) 04/30/2007  . CIGARETTE SMOKER 04/22/2007   PCP:  [name], MD Pharmacy:   [facility], [place] - [address], [address], [place] [address] Phone: [phone] Fax: [phone]  [id number] [id number]: [phone]: [phone]    Social Determinants of Health (SDOH) Interventions    Readmission Risk Interventions No flowsheet data found.

## 2020-08-17 NOTE — Discharge Summary (Signed)
Physician Discharge Summary  Cindy Burns JME:[id number] DOB: [birth year] DOA: 08/15/2020  PCP: Cindy Sanders, MD  Admit date: 08/15/2020 Discharge date: 08/17/2020  Admitted From: home Disposition:  home   Recommendations for Outpatient Follow-up:  1. F/u folic acid and I29 levels 2. Encourage compliance with Metoprolol  Home Health:  ordered  Discharge Condition:  stable   CODE STATUS:  Full code   Consultations:  Nephrology  Neurology   Procedures/Studies: . See below   Discharge Diagnoses:  Principal Problem:   Acute metabolic encephalopathy Active Problems:   Diabetes mellitus due to underlying condition, controlled, with neurologic complication ([place])   Hypertension associated with diabetes ([place])   ESRD (end stage renal disease) ([place])   Closed traumatic minimally displaced fracture of two ribs     Brief Summary: Cindy Burns is a 74 y/o female with ESRD, DM, HTN, COPD who presents from home for confusion. She lives alone with 6 dogs and had missed dialysis x 2 this past week. When her friend went to check on her, she would not open the door for fear that her dogs would run out. EMS was called by the dialysis center. She was found at home in urine and feces. In the ED she was disoriented to time but not to place or situation. She was admitted to work up her confusion. Renal did not feel that she was uremic.  Hospital Course:    Principal Problem:   Acute metabolic encephalopathy- likely uremic -08/17/20 > seen towards the end of dialysis- She is trying to speak but at times seems she is having trouble getting out her words- answers in one word answers-  I am unsure of her baseline- the friend I am able to contact today states she has not seen her in about 1 wk - she does state that she is intermittently confused - CT head > Chronic ischemic microangiopathy and generalized atrophy  - UDS negative - UA + for WBC but rare bacteria- I cannot tell if she is  symptomatic but no fever (temp 99.1 today) or leukocytosis-  - 08/18/20 > very oriented- aware she missed 2 dialysis treatment and states she did not go on Saturday because she was tired - did did not go on Tuesday because she was not in her right mind - MRI negative for acute issues- Neuro does not recommend further work up -appears this was related to uremia from missed dialysis- advised not to miss treatments - no symptoms of a UTI   Active Problems:   Missed dialysis x 2 and hyperkalemia with ESRD  SHPT - hyperkalemia due to missed dialysis- given Lokelma - has been dialyzed on 11/10 and 11/11 and is now ready to go back to her normal dialysis schedule per nephrology  Anemia - of chronic disease with Folate deficiency - MCV was > 100 earlier in the admission but has drifted down to 90s -  B12 low normal at 223 -  Folate level low at 4.7 - start replacement for both- will be given 1 s/c injection of 1000 mcg prior to d/c today  Unsteady gait - ? If related to B12- walked 80 ft but had weakness, balance deficits and decreased cognition yesterday-  PT recommends SNF but she declines this- she has no 24 hr caretaker either but is willing to take home health which she has done in the past    Diabetes mellitus due to underlying condition, controlled, with neurologic complication  - on Januvia at  home - A1c was 4.1 on 06/16/20      Hypertension  - on Amlodipine and Metoprolol per home med list but also not taking Metoprolol per notes  - have resumed both     Closed traumatic minimally displaced fracture of two ribs - ? Chronic- no acute pain  COPD - no exacerbation- she has quit smoking   Discharge Exam: Vitals:   08/17/20 0611 08/17/20 0800  BP: (!) 149/53   Pulse: 74   Resp: 14 16  Temp: 97.9 F (36.6 C)   SpO2: 95% 95%   Vitals:   08/16/20 1239 08/16/20 2143 08/17/20 0611 08/17/20 0800  BP: (!) 156/74 (!) 142/60 (!) 149/53   Pulse: 98 79 74   Resp: 19 16 14 16    Temp: 97.7 F (36.5 C) 98.1 F (36.7 C) 97.9 F (36.6 C)   TempSrc: Oral Oral Oral   SpO2: 99% 91% 95% 95%  Weight:      Height:        General: Pt is alert, awake, not in acute distress Cardiovascular: RRR, S1/S2 +, no rubs, no gallops Respiratory: CTA bilaterally, no wheezing, no rhonchi Abdominal: Soft, NT, ND, bowel sounds + Extremities: no edema, no cyanosis   Discharge Instructions  Discharge Instructions    Diet general   Complete by: As directed    Renal diet, carb modified, heart healthy   Increase activity slowly   Complete by: As directed    No wound care   Complete by: As directed      Allergies as of 08/17/2020   No Known Allergies     Medication List    TAKE these medications   albuterol 108 (90 Base) MCG/ACT inhaler Commonly known as: VENTOLIN HFA TAKE 2 PUFFS BY MOUTH EVERY 6 HOURS AS NEEDED FOR WHEEZE OR SHORTNESS OF BREATH What changed: See the new instructions.   amLODipine 10 MG tablet Commonly known as: NORVASC Take 1 tablet (10 mg total) by mouth daily.   folic acid 1 MG tablet Commonly known as: FOLVITE Take 1 tablet (1 mg total) by mouth daily.   gabapentin 100 MG capsule Commonly known as: NEURONTIN Take 1 capsule (100 mg total) by mouth at bedtime.   ipratropium-albuterol 0.5-2.5 (3) MG/3ML Soln Commonly known as: DUONEB Take 3 mLs by nebulization every 6 (six) hours as needed. What changed: reasons to take this   metoprolol tartrate 25 MG tablet Commonly known as: LOPRESSOR Take 1 tablet (25 mg total) by mouth 2 (two) times daily.   rosuvastatin 20 MG tablet Commonly known as: CRESTOR TAKE 1 TABLET BY MOUTH EVERY DAY   sitaGLIPtin 25 MG tablet Commonly known as: Januvia Take 1 tablet (25 mg total) by mouth daily.   venlafaxine XR 37.5 MG 24 hr capsule Commonly known as: EFFEXOR-XR TAKE 1 CAPSULE BY MOUTH DAILY WITH BREAKFAST. What changed: See the new instructions.   vitamin B-12 1000 MCG tablet Commonly known  as: CYANOCOBALAMIN Take 1 tablet (1,000 mcg total) by mouth daily.       Follow-up Information    Cindy Sanders, MD. Schedule an appointment as soon as possible for a visit in 1 week(s).   Specialty: Family Medicine Contact information: [place] [address] [phone]              No Known Allergies    DG Pelvis 1-2 Views  Result Date: 08/16/2020 CLINICAL DATA:  74 year old female for MRI screening. EXAM: PELVIS - 1-2 VIEW; ABDOMEN -  1 VIEW COMPARISON:  Abdominal radiograph dated 01/08/2020. FINDINGS: No bowel dilatation or evidence of obstruction. There is moderate stool throughout the colon. No free air or radiopaque calculi. Right upper quadrant cholecystectomy clips. Degenerative changes of the spine and hips. There is slight flattening of the left femoral head, likely related to avascular necrosis. IMPRESSION: Cholecystectomy clips. No other radiopaque foreign object identified. Electronically Signed   By: Anner Crete M.D.   On: 08/16/2020 16:40   DG Abd 1 View  Result Date: 08/16/2020 CLINICAL DATA:  74 year old female for MRI screening. EXAM: PELVIS - 1-2 VIEW; ABDOMEN - 1 VIEW COMPARISON:  Abdominal radiograph dated 01/08/2020. FINDINGS: No bowel dilatation or evidence of obstruction. There is moderate stool throughout the colon. No free air or radiopaque calculi. Right upper quadrant cholecystectomy clips. Degenerative changes of the spine and hips. There is slight flattening of the left femoral head, likely related to avascular necrosis. IMPRESSION: Cholecystectomy clips. No other radiopaque foreign object identified. Electronically Signed   By: Anner Crete M.D.   On: 08/16/2020 16:40   CT Head Wo Contrast  Result Date: 08/15/2020 CLINICAL DATA:  Encephalopathy.  Found down. EXAM: CT HEAD WITHOUT CONTRAST CT CERVICAL SPINE WITHOUT CONTRAST TECHNIQUE: Multidetector CT imaging of the head and cervical spine was performed following the  standard protocol without intravenous contrast. Multiplanar CT image reconstructions of the cervical spine were also generated. COMPARISON:  None. FINDINGS: CT HEAD FINDINGS Brain: There is no mass, hemorrhage or extra-axial collection. There is generalized atrophy without lobar predilection. There is hypoattenuation of the periventricular white matter, most commonly indicating chronic ischemic microangiopathy. Vascular: Atherosclerotic calcification of the internal carotid arteries at the skull base. No abnormal hyperdensity of the major intracranial arteries or dural venous sinuses. Skull: The visualized skull base, calvarium and extracranial soft tissues are normal. Sinuses/Orbits: No fluid levels or advanced mucosal thickening of the visualized paranasal sinuses. No mastoid or middle ear effusion. The orbits are normal. CT CERVICAL SPINE FINDINGS Alignment: No static subluxation. Facets are aligned. Occipital condyles are normally positioned. Skull base and vertebrae: No acute fracture. Soft tissues and spinal canal: No prevertebral fluid or swelling. No visible canal hematoma. Disc levels: No advanced spinal canal or neural foraminal stenosis. Upper chest: No pneumothorax, pulmonary nodule or pleural effusion. Other: Cervical spine study is degraded by motion. IMPRESSION: 1. Chronic ischemic microangiopathy and generalized atrophy without acute intracranial abnormality. 2. Motion degraded cervical spine images without visible acute fracture or static subluxation. Electronically Signed   By: Ulyses Jarred M.D.   On: 08/15/2020 20:58   CT Cervical Spine Wo Contrast  Result Date: 08/15/2020 CLINICAL DATA:  Encephalopathy.  Found down. EXAM: CT HEAD WITHOUT CONTRAST CT CERVICAL SPINE WITHOUT CONTRAST TECHNIQUE: Multidetector CT imaging of the head and cervical spine was performed following the standard protocol without intravenous contrast. Multiplanar CT image reconstructions of the cervical spine were also  generated. COMPARISON:  None. FINDINGS: CT HEAD FINDINGS Brain: There is no mass, hemorrhage or extra-axial collection. There is generalized atrophy without lobar predilection. There is hypoattenuation of the periventricular white matter, most commonly indicating chronic ischemic microangiopathy. Vascular: Atherosclerotic calcification of the internal carotid arteries at the skull base. No abnormal hyperdensity of the major intracranial arteries or dural venous sinuses. Skull: The visualized skull base, calvarium and extracranial soft tissues are normal. Sinuses/Orbits: No fluid levels or advanced mucosal thickening of the visualized paranasal sinuses. No mastoid or middle ear effusion. The orbits are normal. CT CERVICAL SPINE FINDINGS Alignment: No static  subluxation. Facets are aligned. Occipital condyles are normally positioned. Skull base and vertebrae: No acute fracture. Soft tissues and spinal canal: No prevertebral fluid or swelling. No visible canal hematoma. Disc levels: No advanced spinal canal or neural foraminal stenosis. Upper chest: No pneumothorax, pulmonary nodule or pleural effusion. Other: Cervical spine study is degraded by motion. IMPRESSION: 1. Chronic ischemic microangiopathy and generalized atrophy without acute intracranial abnormality. 2. Motion degraded cervical spine images without visible acute fracture or static subluxation. Electronically Signed   By: Ulyses Jarred M.D.   On: 08/15/2020 20:58   MR BRAIN WO CONTRAST  Result Date: 08/16/2020 CLINICAL DATA:  Mental status change, unknown cause. Additional provided: Confusion, missed dialysis twice in the past week EXAM: MRI HEAD WITHOUT CONTRAST TECHNIQUE: Multiplanar, multiecho pulse sequences of the brain and surrounding structures were obtained without intravenous contrast. COMPARISON:  Head CT 08/15/2020. head CT 08/08/2014 FINDINGS: Brain: Mild generalized cerebral atrophy. Mild T2/FLAIR hyperintensity within the periventricular  cerebral white matter is nonspecific, but compatible with chronic small vessel ischemic disease. There is no acute infarct. No evidence of intracranial mass. No chronic intracranial blood products. No extra-axial fluid collection. No midline shift. Partially empty sella turcica. Vascular: Expected proximal arterial flow voids. Skull and upper cervical spine: No focal marrow lesion. Cervical spondylosis Sinuses/Orbits: Visualized orbits show no acute finding. Frothy secretions within the left sphenoid sinus. Other: Trace fluid within right mastoid air cells. IMPRESSION: No evidence of acute intracranial abnormality, including acute infarction. Mild cerebral atrophy and chronic small vessel ischemic disease. Left sphenoid sinusitis. Trace right mastoid effusion. Electronically Signed   By: Kellie Simmering DO   On: 08/16/2020 17:29   DG Chest Port 1 View  Result Date: 08/15/2020 CLINICAL DATA:  Shortness of breath. EXAM: PORTABLE CHEST 1 VIEW COMPARISON:  April 29, 2020. FINDINGS: Stable cardiomediastinal silhouette. No pneumothorax or pleural effusion is noted. Right internal jugular dialysis catheter is unchanged in position. Both lungs are clear. Mildly displaced fractures are seen involving the lateral portions of the right eighth and ninth ribs. IMPRESSION: Mildly displaced right eighth and ninth rib fractures. No acute cardiopulmonary abnormality seen. Electronically Signed   By: Marijo Conception M.D.   On: 08/15/2020 19:18     The results of significant diagnostics from this hospitalization (including imaging, microbiology, ancillary and laboratory) are listed below for reference.     Microbiology: Recent Results (from the past 240 hour(s))  Respiratory Panel by RT PCR (Flu A&B, Covid) - Nasopharyngeal Swab     Status: None   Collection Time: 08/15/20 10:41 PM   Specimen: Nasopharyngeal Swab  Result Value Ref Range Status   SARS Coronavirus 2 by RT PCR NEGATIVE NEGATIVE Final    Comment:  (NOTE) SARS-CoV-2 target nucleic acids are NOT DETECTED.  The SARS-CoV-2 RNA is generally detectable in upper respiratoy specimens during the acute phase of infection. The lowest concentration of SARS-CoV-2 viral copies this assay can detect is 131 copies/mL. A negative result does not preclude SARS-Cov-2 infection and should not be used as the sole basis for treatment or other patient management decisions. A negative result may occur with  improper specimen collection/handling, submission of specimen other than nasopharyngeal swab, presence of viral mutation(s) within the areas targeted by this assay, and inadequate number of viral copies (<131 copies/mL). A negative result must be combined with clinical observations, patient history, and epidemiological information. The expected result is Negative.  Fact Sheet for Patients:  PinkCheek.be  Fact Sheet for Healthcare Providers:  GravelBags.it  This test is no t yet approved or cleared by the [place] and  has been authorized for detection and/or diagnosis of SARS-CoV-2 by FDA under an Emergency Use Authorization (EUA). This EUA will remain  in effect (meaning this test can be used) for the duration of the COVID-19 declaration under Section 564(b)(1) of the Act, 21 U.S.C. section 360bbb-3(b)(1), unless the authorization is terminated or revoked sooner.     Influenza A by PCR NEGATIVE NEGATIVE Final   Influenza B by PCR NEGATIVE NEGATIVE Final    Comment: (NOTE) The Xpert Xpress SARS-CoV-2/FLU/RSV assay is intended as an aid in  the diagnosis of influenza from Nasopharyngeal swab specimens and  should not be used as a sole basis for treatment. Nasal washings and  aspirates are unacceptable for Xpert Xpress SARS-CoV-2/FLU/RSV  testing.  Fact Sheet for Patients: PinkCheek.be  Fact Sheet for Healthcare  Providers: GravelBags.it  This test is not yet approved or cleared by the [place] FDA and  has been authorized for detection and/or diagnosis of SARS-CoV-2 by  FDA under an Emergency Use Authorization (EUA). This EUA will remain  in effect (meaning this test can be used) for the duration of the  Covid-19 declaration under Section 564(b)(1) of the Act, 21  U.S.C. section 360bbb-3(b)(1), unless the authorization is  terminated or revoked. Performed at [facility], [place] [address] [place], [place] [address]      Labs: BNP (last 3 results) Recent Labs    01/08/20 1412  BNP 47.8   Basic Metabolic Panel: Recent Labs  Lab 08/15/20 1724 08/16/20 0313 08/17/20 0108  NA 136 136 138  K 5.5* 5.7* 4.1  CL 102 102 103  CO2 15* 17* 25  GLUCOSE 124* 121* 86  BUN 99* 101* 28*  CREATININE 9.53* 9.41* 4.42*  CALCIUM 8.6* 7.9* 8.7*   Liver Function Tests: Recent Labs  Lab 08/15/20 1724  AST 16  ALT 10  ALKPHOS 61  BILITOT 0.8  PROT 7.1  ALBUMIN 3.5   No results for input(s): LIPASE, AMYLASE in the last 168 hours. Recent Labs  Lab 08/15/20 2117  AMMONIA 35   CBC: Recent Labs  Lab 08/15/20 1724 08/16/20 0313 08/17/20 0108  WBC 5.2 3.9* 4.4  HGB 10.9* 9.4* 9.9*  HCT 36.5 30.3* 31.7*  MCV 102.8* 99.0 95.8  PLT 201 183 183   Cardiac Enzymes: No results for input(s): CKTOTAL, CKMB, CKMBINDEX, TROPONINI in the last 168 hours. BNP: Invalid input(s): POCBNP CBG: Recent Labs  Lab 08/16/20 1242 08/16/20 1636 08/16/20 2110 08/17/20 0711 08/17/20 1229  GLUCAP 90 134* 139* 116* 116*   D-Dimer No results for input(s): DDIMER in the last 72 hours. Hgb A1c No results for input(s): HGBA1C in the last 72 hours. Lipid Profile No results for input(s): CHOL, HDL, LDLCALC, TRIG, CHOLHDL, LDLDIRECT in the last 72 hours. Thyroid function studies No results for input(s): TSH, T4TOTAL, T3FREE, THYROIDAB in the last 72 hours.  Invalid  input(s): FREET3 Anemia work up Recent Labs    08/16/20 1342  VITAMINB12 223  FOLATE 4.7*   Urinalysis    Component Value Date/Time   COLORURINE YELLOW 08/16/2020 0513   APPEARANCEUR CLOUDY (A) 08/16/2020 0513   LABSPEC 1.006 08/16/2020 0513   PHURINE 6.0 08/16/2020 0513   GLUCOSEU 50 (A) 08/16/2020 0513   HGBUR SMALL (A) 08/16/2020 0513   HGBUR trace-intact 06/24/2008 0912   BILIRUBINUR NEGATIVE 08/16/2020 0513   BILIRUBINUR negative 09/23/2013 [place] 08/16/2020 0513  PROTEINUR 100 (A) 08/16/2020 0513   UROBILINOGEN 0.2 09/23/2013 1559   UROBILINOGEN 0.2 06/24/2008 0912   NITRITE NEGATIVE 08/16/2020 0513   LEUKOCYTESUR LARGE (A) 08/16/2020 0513   Sepsis Labs Invalid input(s): PROCALCITONIN,  WBC,  LACTICIDVEN Microbiology Recent Results (from the past 240 hour(s))  Respiratory Panel by RT PCR (Flu A&B, Covid) - Nasopharyngeal Swab     Status: None   Collection Time: 08/15/20 10:41 PM   Specimen: Nasopharyngeal Swab  Result Value Ref Range Status   SARS Coronavirus 2 by RT PCR NEGATIVE NEGATIVE Final    Comment: (NOTE) SARS-CoV-2 target nucleic acids are NOT DETECTED.  The SARS-CoV-2 RNA is generally detectable in upper respiratoy specimens during the acute phase of infection. The lowest concentration of SARS-CoV-2 viral copies this assay can detect is 131 copies/mL. A negative result does not preclude SARS-Cov-2 infection and should not be used as the sole basis for treatment or other patient management decisions. A negative result may occur with  improper specimen collection/handling, submission of specimen other than nasopharyngeal swab, presence of viral mutation(s) within the areas targeted by this assay, and inadequate number of viral copies (<131 copies/mL). A negative result must be combined with clinical observations, patient history, and epidemiological information. The expected result is Negative.  Fact Sheet for Patients:   PinkCheek.be  Fact Sheet for Healthcare Providers:  GravelBags.it  This test is no t yet approved or cleared by the [place] FDA and  has been authorized for detection and/or diagnosis of SARS-CoV-2 by FDA under an Emergency Use Authorization (EUA). This EUA will remain  in effect (meaning this test can be used) for the duration of the COVID-19 declaration under Section 564(b)(1) of the Act, 21 U.S.C. section 360bbb-3(b)(1), unless the authorization is terminated or revoked sooner.     Influenza A by PCR NEGATIVE NEGATIVE Final   Influenza B by PCR NEGATIVE NEGATIVE Final    Comment: (NOTE) The Xpert Xpress SARS-CoV-2/FLU/RSV assay is intended as an aid in  the diagnosis of influenza from Nasopharyngeal swab specimens and  should not be used as a sole basis for treatment. Nasal washings and  aspirates are unacceptable for Xpert Xpress SARS-CoV-2/FLU/RSV  testing.  Fact Sheet for Patients: PinkCheek.be  Fact Sheet for Healthcare Providers: GravelBags.it  This test is not yet approved or cleared by the [place] FDA and  has been authorized for detection and/or diagnosis of SARS-CoV-2 by  FDA under an Emergency Use Authorization (EUA). This EUA will remain  in effect (meaning this test can be used) for the duration of the  Covid-19 declaration under Section 564(b)(1) of the Act, 21  U.S.C. section 360bbb-3(b)(1), unless the authorization is  terminated or revoked. Performed at [facility], [place] [address], [place], [place] [address]      Time coordinating discharge in minutes: 65  SIGNED:   Debbe Odea, MD  [facility] 08/17/2020, 1:03 PM

## 2020-08-17 NOTE — Progress Notes (Signed)
[facility] Progress Note   Subjective:  Says she feels back to baseline.  Tolerated HD yesterday with UF 3L.  MRI unrevealing.  Objective Vitals:   08/16/20 1150 08/16/20 1239 08/16/20 2143 08/17/20 0611  BP: (!) 177/74 (!) 156/74 (!) 142/60 (!) 149/53  Pulse: 95 98 79 74  Resp: (!) 21 19 16 14   Temp: 99.1 F (37.3 C) 97.7 F (36.5 C) 98.1 F (36.7 C) 97.9 F (36.6 C)  TempSrc: Oral Oral Oral Oral  SpO2: 98% 99% 91% 95%  Weight:      Height:       Physical Exam General: lying comfortably in bed. Heart: RRR Lungs: clear Abdomen:soft Extremities: no edema Neuro:  Awake, alert and oriented to self, [facility], [facility], 2021 but has no recollection of having dialysis yesterday. Dialysis Access: [facility] c/d/i   Additional Objective Labs: Basic Metabolic Panel: Recent Labs  Lab 08/15/20 1724 08/16/20 0313 08/17/20 0108  NA 136 136 138  K 5.5* 5.7* 4.1  CL 102 102 103  CO2 15* 17* 25  GLUCOSE 124* 121* 86  BUN 99* 101* 28*  CREATININE 9.53* 9.41* 4.42*  CALCIUM 8.6* 7.9* 8.7*   Liver Function Tests: Recent Labs  Lab 08/15/20 1724  AST 16  ALT 10  ALKPHOS 61  BILITOT 0.8  PROT 7.1  ALBUMIN 3.5   No results for input(s): LIPASE, AMYLASE in the last 168 hours. CBC: Recent Labs  Lab 08/15/20 1724 08/16/20 0313 08/17/20 0108  WBC 5.2 3.9* 4.4  HGB 10.9* 9.4* 9.9*  HCT 36.5 30.3* 31.7*  MCV 102.8* 99.0 95.8  PLT 201 183 183   Blood Culture    Component Value Date/Time   SDES URINE, CATHETERIZED 04/30/2020 0143   SPECREQUEST  04/30/2020 0143    NONE Performed at [facility] [address], [place], [place] [address]    CULT >=100,000 COLONIES/mL ESCHERICHIA COLI (A) 04/30/2020 0143   REPTSTATUS 05/02/2020 FINAL 04/30/2020 0143    Cardiac Enzymes: No results for input(s): CKTOTAL, CKMB, CKMBINDEX, TROPONINI in the last 168 hours. CBG: Recent Labs  Lab 08/16/20 0650 08/16/20 1242 08/16/20 1636 08/16/20 2110  08/17/20 0711  GLUCAP 84 90 134* 139* 116*   Iron Studies: No results for input(s): IRON, TIBC, TRANSFERRIN, FERRITIN in the last 72 hours. @lablastinr3 @ Studies/Results: DG Pelvis 1-2 Views  Result Date: 08/16/2020 CLINICAL DATA:  74 year old female for MRI screening. EXAM: PELVIS - 1-2 VIEW; ABDOMEN - 1 VIEW COMPARISON:  Abdominal radiograph dated 01/08/2020. FINDINGS: No bowel dilatation or evidence of obstruction. There is moderate stool throughout the colon. No free air or radiopaque calculi. Right upper quadrant cholecystectomy clips. Degenerative changes of the spine and hips. There is slight flattening of the left femoral head, likely related to avascular necrosis. IMPRESSION: Cholecystectomy clips. No other radiopaque foreign object identified. Electronically Signed   By: Anner Crete M.D.   On: 08/16/2020 16:40   DG Abd 1 View  Result Date: 08/16/2020 CLINICAL DATA:  74 year old female for MRI screening. EXAM: PELVIS - 1-2 VIEW; ABDOMEN - 1 VIEW COMPARISON:  Abdominal radiograph dated 01/08/2020. FINDINGS: No bowel dilatation or evidence of obstruction. There is moderate stool throughout the colon. No free air or radiopaque calculi. Right upper quadrant cholecystectomy clips. Degenerative changes of the spine and hips. There is slight flattening of the left femoral head, likely related to avascular necrosis. IMPRESSION: Cholecystectomy clips. No other radiopaque foreign object identified. Electronically Signed   By: Laren Everts.D.  On: 08/16/2020 16:40   CT Head Wo Contrast  Result Date: 08/15/2020 CLINICAL DATA:  Encephalopathy.  Found down. EXAM: CT HEAD WITHOUT CONTRAST CT CERVICAL SPINE WITHOUT CONTRAST TECHNIQUE: Multidetector CT imaging of the head and cervical spine was performed following the standard protocol without intravenous contrast. Multiplanar CT image reconstructions of the cervical spine were also generated. COMPARISON:  None. FINDINGS: CT HEAD FINDINGS  Brain: There is no mass, hemorrhage or extra-axial collection. There is generalized atrophy without lobar predilection. There is hypoattenuation of the periventricular white matter, most commonly indicating chronic ischemic microangiopathy. Vascular: Atherosclerotic calcification of the internal carotid arteries at the skull base. No abnormal hyperdensity of the major intracranial arteries or dural venous sinuses. Skull: The visualized skull base, calvarium and extracranial soft tissues are normal. Sinuses/Orbits: No fluid levels or advanced mucosal thickening of the visualized paranasal sinuses. No mastoid or middle ear effusion. The orbits are normal. CT CERVICAL SPINE FINDINGS Alignment: No static subluxation. Facets are aligned. Occipital condyles are normally positioned. Skull base and vertebrae: No acute fracture. Soft tissues and spinal canal: No prevertebral fluid or swelling. No visible canal hematoma. Disc levels: No advanced spinal canal or neural foraminal stenosis. Upper chest: No pneumothorax, pulmonary nodule or pleural effusion. Other: Cervical spine study is degraded by motion. IMPRESSION: 1. Chronic ischemic microangiopathy and generalized atrophy without acute intracranial abnormality. 2. Motion degraded cervical spine images without visible acute fracture or static subluxation. Electronically Signed   By: Ulyses Jarred M.D.   On: 08/15/2020 20:58   CT Cervical Spine Wo Contrast  Result Date: 08/15/2020 CLINICAL DATA:  Encephalopathy.  Found down. EXAM: CT HEAD WITHOUT CONTRAST CT CERVICAL SPINE WITHOUT CONTRAST TECHNIQUE: Multidetector CT imaging of the head and cervical spine was performed following the standard protocol without intravenous contrast. Multiplanar CT image reconstructions of the cervical spine were also generated. COMPARISON:  None. FINDINGS: CT HEAD FINDINGS Brain: There is no mass, hemorrhage or extra-axial collection. There is generalized atrophy without lobar predilection.  There is hypoattenuation of the periventricular white matter, most commonly indicating chronic ischemic microangiopathy. Vascular: Atherosclerotic calcification of the internal carotid arteries at the skull base. No abnormal hyperdensity of the major intracranial arteries or dural venous sinuses. Skull: The visualized skull base, calvarium and extracranial soft tissues are normal. Sinuses/Orbits: No fluid levels or advanced mucosal thickening of the visualized paranasal sinuses. No mastoid or middle ear effusion. The orbits are normal. CT CERVICAL SPINE FINDINGS Alignment: No static subluxation. Facets are aligned. Occipital condyles are normally positioned. Skull base and vertebrae: No acute fracture. Soft tissues and spinal canal: No prevertebral fluid or swelling. No visible canal hematoma. Disc levels: No advanced spinal canal or neural foraminal stenosis. Upper chest: No pneumothorax, pulmonary nodule or pleural effusion. Other: Cervical spine study is degraded by motion. IMPRESSION: 1. Chronic ischemic microangiopathy and generalized atrophy without acute intracranial abnormality. 2. Motion degraded cervical spine images without visible acute fracture or static subluxation. Electronically Signed   By: Ulyses Jarred M.D.   On: 08/15/2020 20:58   MR BRAIN WO CONTRAST  Result Date: 08/16/2020 CLINICAL DATA:  Mental status change, unknown cause. Additional provided: Confusion, missed dialysis twice in the past week EXAM: MRI HEAD WITHOUT CONTRAST TECHNIQUE: Multiplanar, multiecho pulse sequences of the brain and surrounding structures were obtained without intravenous contrast. COMPARISON:  Head CT 08/15/2020. head CT 08/08/2014 FINDINGS: Brain: Mild generalized cerebral atrophy. Mild T2/FLAIR hyperintensity within the periventricular cerebral white matter is nonspecific, but compatible with chronic small vessel ischemic disease.  There is no acute infarct. No evidence of intracranial mass. No chronic  intracranial blood products. No extra-axial fluid collection. No midline shift. Partially empty sella turcica. Vascular: Expected proximal arterial flow voids. Skull and upper cervical spine: No focal marrow lesion. Cervical spondylosis Sinuses/Orbits: Visualized orbits show no acute finding. Frothy secretions within the left sphenoid sinus. Other: Trace fluid within right mastoid air cells. IMPRESSION: No evidence of acute intracranial abnormality, including acute infarction. Mild cerebral atrophy and chronic small vessel ischemic disease. Left sphenoid sinusitis. Trace right mastoid effusion. Electronically Signed   By: Kellie Simmering DO   On: 08/16/2020 17:29   DG Chest Port 1 View  Result Date: 08/15/2020 CLINICAL DATA:  Shortness of breath. EXAM: PORTABLE CHEST 1 VIEW COMPARISON:  April 29, 2020. FINDINGS: Stable cardiomediastinal silhouette. No pneumothorax or pleural effusion is noted. Right internal jugular dialysis catheter is unchanged in position. Both lungs are clear. Mildly displaced fractures are seen involving the lateral portions of the right eighth and ninth ribs. IMPRESSION: Mildly displaced right eighth and ninth rib fractures. No acute cardiopulmonary abnormality seen. Electronically Signed   By: Marijo Conception M.D.   On: 08/15/2020 19:18   Medications:  . amLODipine  10 mg Oral Daily  . Chlorhexidine Gluconate Cloth  6 each Topical Q0600  . heparin  5,000 Units Subcutaneous Q8H  . insulin aspart  0-6 Units Subcutaneous TID WC  . metoprolol tartrate  25 mg Oral BID    Dialysis Orders: Hemodialysis prescription: [facility], TTS, 180 dialyzer, BFR 400/DFR 800, 2K/2 calcium, 3 hours 30 minutes, EDW 51.5 kg, heparin 2000 unit bolus, Mircera 75 mcg every 2 weeks, Venofer 100 mg 3 times weekly (stop date 11/16) and calcitriol 0.25 mcg 3 times weekly.  Assessment/Plan: 1. Altered mental status: Per primary. W/u to date is unrevealing. She says she feels back to  baseline.  I think if she works with PT and continues to feel well today discharge is reasonable. 2.  End-stage renal disease: TTS - HD make up Wed for missed tx then will resume outpt schedule at some point prior to discharge pending availability per HD unit census. 3.  Hyperkalemia: Resolved s/p lokelma then dialysis. K 4.1 this AM.  4.  Hypertension: Resume oral antihypertensive therapy now that mental status is improved.  Improved after ultrafiltration/dialysis. 5.  Anemia of chronic kidney disease: Hemoglobin and hematocrit within acceptable range, follow trends to decide on need for ESA/iron. 6.  Secondary hyperparathyroidism: Resumed calcitriol with hemodialysis for PTH control and follow calcium/phosphorus trend.   Dispo:  Ok for d/c from renal perspective - if goes today before dialysis can do next dialysis at home clinic on Sat.   Jannifer Hick MD 08/17/2020, 7:20 AM  [facility] Pager: [phone]

## 2020-08-17 NOTE — Evaluation (Signed)
Occupational Therapy Evaluation Patient Details Name: Cindy Burns MRN: [id number] DOB: 07/02/1946 Today's Date: 08/17/2020    History of Present Illness Pt is a 74 y.o. M with significant PMH of ESRD, DM, HTN, COPD who presents from home for confusion after missed dialysis x 2 this past week. Also found to have closed traumatic minimally displaced fractures of two ribs. CT head showing chronic ischemic microangiopathy and generalized atrophy without. MRI negative.    Clinical Impression   PTA patient independent and driving. Admitted for above and limited by problem list below, including impaired balance, generalized weakness, decreased activity tolerance and impaired cognition. Patient oriented today, follows commands with increased time for processing and cueing for problem solving/safety awareness.  Patient currently requires min assist for toilet transfers, mod assist for LB ADLs and toileting, min assist for grooming at sink.  She will benefit from continued OT service while admitted and after dc at SNF level to optimize independence and safety with ADLs, mobility prior to returning home alone.     Follow Up Recommendations  SNF;Supervision/Assistance - 24 hour    Equipment Recommendations  3 in 1 bedside commode    Recommendations for Other Services       Precautions / Restrictions Precautions Precautions: Fall Restrictions Weight Bearing Restrictions: No      Mobility Bed Mobility               General bed mobility comments: pt OOB on commode upon entry (with NT)    Transfers Overall transfer level: Needs assistance Equipment used: Rolling walker (2 wheeled) Transfers: Sit to/from Stand Sit to Stand: Min assist         General transfer comment: min assist to power up from commode with cueing for hand placement and safety     Balance Overall balance assessment: Needs assistance Sitting-balance support: No upper extremity supported;Feet  supported Sitting balance-Leahy Scale: Fair     Standing balance support: Bilateral upper extremity supported;No upper extremity supported;During functional activity Standing balance-Leahy Scale: Poor Standing balance comment: relies on BUE support dynamically, grooming with 0 hand support and min guard at sink                            ADL either performed or assessed with clinical judgement   ADL Overall ADL's : Needs assistance/impaired     Grooming: Min guard;Standing Grooming Details (indicate cue type and reason): min cueing for sequencing, using soap  Upper Body Bathing: Set up;Sitting   Lower Body Bathing: Minimal assistance;Sit to/from stand   Upper Body Dressing : Minimal assistance;Sitting   Lower Body Dressing: Sit to/from stand;Moderate assistance Lower Body Dressing Details (indicate cue type and reason): requires assist to adjust socks, min assist to power up into standing  Toilet Transfer: Minimal assistance;Ambulation;RW;Grab Information systems manager Details (indicate cue type and reason): min assist with cueing for hand palcement and safety Toileting- Clothing Manipulation and Hygiene: Moderate assistance;Sit to/from stand Toileting - Clothing Manipulation Details (indicate cue type and reason): clothing mgmt and hygiene thoroughness     Functional mobility during ADLs: Rolling walker;Minimal assistance General ADL Comments: pt limited by weakness, impaired balance, decraesed safety awareness     Vision   Vision Assessment?: No apparent visual deficits     Perception     Praxis      Pertinent Vitals/Pain Pain Assessment: No/denies pain     Hand Dominance Right   Extremity/Trunk Assessment Upper Extremity Assessment Upper  Extremity Assessment: Generalized weakness   Lower Extremity Assessment Lower Extremity Assessment: Defer to PT evaluation   Cervical / Trunk Assessment Cervical / Trunk Assessment: Kyphotic    Communication Communication Communication: No difficulties   Cognition Arousal/Alertness: Awake/alert Behavior During Therapy: WFL for tasks assessed/performed Overall Cognitive Status: Impaired/Different from baseline Area of Impairment: Memory;Safety/judgement;Awareness;Problem solving                     Memory: Decreased short-term memory   Safety/Judgement: Decreased awareness of safety;Decreased awareness of deficits Awareness: Emergent Problem Solving: Slow processing;Decreased initiation;Requires verbal cues General Comments: pt oriented, following commands with increased time; slow processing and requires cueing for safety and problem sovling    General Comments       Exercises     Shoulder Instructions      Home Living Family/patient expects to be discharged to:: Private residence Living Arrangements: Alone Available Help at Discharge: Friend(s);Available PRN/intermittently Type of Home: Mobile home Home Access: Stairs to enter (or reports "unsafe" ramp) Entrance Stairs-Number of Steps: 3  Entrance Stairs-Rails:  (+ rail) Home Layout: One level     Bathroom Shower/Tub: Occupational psychologist: Standard     Home Equipment: Cane - single point;Walker - 4 wheels          Prior Functioning/Environment Level of Independence: Needs assistance  Gait / Transfers Assistance Needed: reports using cane, driving to dialysis ADL's / Homemaking Assistance Needed: independent ADL's/IADL's            OT Problem List: Decreased strength;Decreased activity tolerance;Impaired balance (sitting and/or standing);Decreased cognition;Decreased safety awareness;Decreased knowledge of precautions;Decreased knowledge of use of DME or AE      OT Treatment/Interventions: Self-care/ADL training;DME and/or AE instruction;Therapeutic activities;Cognitive remediation/compensation;Patient/family education;Balance training;Therapeutic exercise    OT Goals(Current  goals can be found in the care plan section) Acute Rehab OT Goals Patient Stated Goal: to get better and get home  OT Goal Formulation: With patient Time For Goal Achievement: 08/31/20 Potential to Achieve Goals: Good  OT Frequency: Min 2X/week   Barriers to D/C:            Co-evaluation              AM-PAC OT "6 Clicks" Daily Activity     Outcome Measure Help from another person eating meals?: None Help from another person taking care of personal grooming?: A Little Help from another person toileting, which includes using toliet, bedpan, or urinal?: A Lot Help from another person bathing (including washing, rinsing, drying)?: A Little Help from another person to put on and taking off regular upper body clothing?: A Little Help from another person to put on and taking off regular lower body clothing?: A Lot 6 Click Score: 17   End of Session Equipment Utilized During Treatment: Rolling walker Nurse Communication: Mobility status  Activity Tolerance: Patient tolerated treatment well Patient left: in chair;with call bell/phone within reach;with chair alarm set  OT Visit Diagnosis: Other abnormalities of gait and mobility (R26.89);Muscle weakness (generalized) (M62.81);Other symptoms and signs involving cognitive function                Time: 8938-1017 OT Time Calculation (min): 14 min Charges:  OT General Charges $OT Visit: 1 Visit OT Evaluation $OT Eval Moderate Complexity: 1 Mod  Jolaine Artist, OT Acute Rehabilitation Services Pager [phone] Office [phone]   Delight Stare 08/17/2020, 9:20 AM

## 2020-08-17 NOTE — Telephone Encounter (Signed)
Transition Care Management Follow-up Telephone Call  Date of discharge and from where[facility]  How have you been since you were released from the hospital? Patient states that she is feeling much better.   Any questions or concerns? No  Items Reviewed:  Did the pt receive and understand the discharge instructions provided? Yes   Medications obtained and verified? Yes   Other? No   Any new allergies since your discharge? No   Dietary orders reviewed? Yes  Do you have support at home? Yes   Home Care and Equipment/Supplies: Were home health services ordered?yes  If so, what is the name of the agency? [place]  Has the agency set up a time to come to the patient's home? no Were any new equipment or medical supplies ordered?  No What is the name of the medical supply agency? N/A Were you able to get the supplies/equipment? not applicable Do you have any questions related to the use of the equipment or supplies? No  Functional Questionnaire: (I = Independent and D = Dependent) ADLs: I  Bathing/Dressing- I  Meal Prep- I  Eating- I  Maintaining continence- I  Transferring/Ambulation- I  Managing Meds- I  Follow up appointments reviewed:   PCP Hospital f/u appt confirmed? Yes  Scheduled to see Dr. Diona Browner on 08/25/2020 @ 2 pm.  [facility] f/u appt confirmed? N/A .  Are transportation arrangements needed? No   If their condition worsens, is the pt aware to call PCP or go to the Emergency Dept.? Yes  Was the patient provided with contact information for the PCP's office or ED? Yes  Was to pt encouraged to call back with questions or concerns? Yes

## 2020-08-18 ENCOUNTER — Other Ambulatory Visit: Payer: Self-pay | Admitting: *Deleted

## 2020-08-18 ENCOUNTER — Telehealth: Payer: Self-pay | Admitting: Nurse Practitioner

## 2020-08-18 NOTE — Telephone Encounter (Signed)
Transition of care contact from inpatient facility  Date of discharge: 08/17/2020 Date of contact: 08/18/2020 Method: Phone Spoke to: Patient  Patient contacted to discuss transition of care from recent inpatient hospitalization. Patient was admitted to [facility] from 11/09-11/08/2020 with discharge diagnosis of AMS. Medication changes were reviewed.  Patient will follow up with his/her outpatient HD unit on: 08/18/2020.

## 2020-08-18 NOTE — Patient Outreach (Signed)
[place]) Care Management  [place]  08/18/2020   Cindy Burns 06/24/1946 [id number]   [place] Discipline Closure  Referral Date:  08/04/2020 Referral Source:  Transfer from [place] Reason for Referral:  Continued Disease Management Education Insurance:  [employer] and [place]   Outreach Attempt:  Upon chart review noticed patient was just discharged from [facility] on 08/17/2020 due to confusion from possible missed hemodialysis treatments.    Plan:  RN Health Coach will close Disease Management program and place referral for [place] Coordinator for Transitions of Care post hospital discharge follow up.   [place] [phone] [email or web address]

## 2020-08-19 DIAGNOSIS — D689 Coagulation defect, unspecified: Secondary | ICD-10-CM | POA: Diagnosis not present

## 2020-08-19 DIAGNOSIS — N186 End stage renal disease: Secondary | ICD-10-CM | POA: Diagnosis not present

## 2020-08-19 DIAGNOSIS — N2581 Secondary hyperparathyroidism of renal origin: Secondary | ICD-10-CM | POA: Diagnosis not present

## 2020-08-19 DIAGNOSIS — T8249XA Other complication of vascular dialysis catheter, initial encounter: Secondary | ICD-10-CM | POA: Diagnosis not present

## 2020-08-19 DIAGNOSIS — D509 Iron deficiency anemia, unspecified: Secondary | ICD-10-CM | POA: Diagnosis not present

## 2020-08-19 DIAGNOSIS — Z992 Dependence on renal dialysis: Secondary | ICD-10-CM | POA: Diagnosis not present

## 2020-08-19 DIAGNOSIS — D688 Other specified coagulation defects: Secondary | ICD-10-CM | POA: Diagnosis not present

## 2020-08-21 ENCOUNTER — Other Ambulatory Visit: Payer: Self-pay

## 2020-08-21 NOTE — Patient Outreach (Signed)
[place] [facility]) Care Management  08/21/2020  GERAL TUCH Jan 07, 1946 [id number]   New Referral: Reason for referral:  Admission for confusion after missing 2 dialysis appointments. MD office does Transition of care.  Placed call to patient with no answer. Left a message requesting a call back. Will send unsuccessful outreach letter and call back in 3-4 business days.  Tomasa Rand, RN, BSN, CEN [facility] [employer] [phone]

## 2020-08-22 DIAGNOSIS — N2581 Secondary hyperparathyroidism of renal origin: Secondary | ICD-10-CM | POA: Diagnosis not present

## 2020-08-22 DIAGNOSIS — D688 Other specified coagulation defects: Secondary | ICD-10-CM | POA: Diagnosis not present

## 2020-08-22 DIAGNOSIS — E875 Hyperkalemia: Secondary | ICD-10-CM | POA: Diagnosis not present

## 2020-08-22 DIAGNOSIS — N186 End stage renal disease: Secondary | ICD-10-CM | POA: Diagnosis not present

## 2020-08-22 DIAGNOSIS — D689 Coagulation defect, unspecified: Secondary | ICD-10-CM | POA: Diagnosis not present

## 2020-08-22 DIAGNOSIS — T8249XA Other complication of vascular dialysis catheter, initial encounter: Secondary | ICD-10-CM | POA: Diagnosis not present

## 2020-08-22 DIAGNOSIS — Z992 Dependence on renal dialysis: Secondary | ICD-10-CM | POA: Diagnosis not present

## 2020-08-22 DIAGNOSIS — D509 Iron deficiency anemia, unspecified: Secondary | ICD-10-CM | POA: Diagnosis not present

## 2020-08-24 ENCOUNTER — Other Ambulatory Visit: Payer: Self-pay

## 2020-08-24 DIAGNOSIS — D509 Iron deficiency anemia, unspecified: Secondary | ICD-10-CM | POA: Diagnosis not present

## 2020-08-24 DIAGNOSIS — D688 Other specified coagulation defects: Secondary | ICD-10-CM | POA: Diagnosis not present

## 2020-08-24 DIAGNOSIS — Z992 Dependence on renal dialysis: Secondary | ICD-10-CM | POA: Diagnosis not present

## 2020-08-24 DIAGNOSIS — T8249XA Other complication of vascular dialysis catheter, initial encounter: Secondary | ICD-10-CM | POA: Diagnosis not present

## 2020-08-24 DIAGNOSIS — D689 Coagulation defect, unspecified: Secondary | ICD-10-CM | POA: Diagnosis not present

## 2020-08-24 DIAGNOSIS — N186 End stage renal disease: Secondary | ICD-10-CM | POA: Diagnosis not present

## 2020-08-24 DIAGNOSIS — N2581 Secondary hyperparathyroidism of renal origin: Secondary | ICD-10-CM | POA: Diagnosis not present

## 2020-08-24 NOTE — Patient Outreach (Signed)
[place] [facility]) Care Management  08/24/2020  Cindy Burns 17-Aug-1946 [id number]   Telephone assessment:  Placed call to patient for 2nd outreach attempt. No answer. Left a message requesting a call back.  PLAN: will attempt again in 3 business days if no response.  Tomasa Rand, RN, BSN, CEN [facility] [employer] [phone]

## 2020-08-25 ENCOUNTER — Telehealth (INDEPENDENT_AMBULATORY_CARE_PROVIDER_SITE_OTHER): Payer: Medicare Other | Admitting: Family Medicine

## 2020-08-25 ENCOUNTER — Encounter: Payer: Self-pay | Admitting: Family Medicine

## 2020-08-25 VITALS — Ht 61.8 in

## 2020-08-25 DIAGNOSIS — N186 End stage renal disease: Secondary | ICD-10-CM | POA: Diagnosis not present

## 2020-08-25 DIAGNOSIS — E0840 Diabetes mellitus due to underlying condition with diabetic neuropathy, unspecified: Secondary | ICD-10-CM | POA: Diagnosis not present

## 2020-08-25 DIAGNOSIS — G9341 Metabolic encephalopathy: Secondary | ICD-10-CM

## 2020-08-25 DIAGNOSIS — E1159 Type 2 diabetes mellitus with other circulatory complications: Secondary | ICD-10-CM

## 2020-08-25 DIAGNOSIS — M79604 Pain in right leg: Secondary | ICD-10-CM

## 2020-08-25 DIAGNOSIS — E1143 Type 2 diabetes mellitus with diabetic autonomic (poly)neuropathy: Secondary | ICD-10-CM

## 2020-08-25 DIAGNOSIS — I152 Hypertension secondary to endocrine disorders: Secondary | ICD-10-CM

## 2020-08-25 DIAGNOSIS — M79605 Pain in left leg: Secondary | ICD-10-CM

## 2020-08-25 MED ORDER — GABAPENTIN 300 MG PO CAPS: ORAL_CAPSULE | ORAL | 3 refills | Status: DC

## 2020-08-25 NOTE — Assessment & Plan Note (Signed)
Unclear cause.  Ongoing x > 6 months per pt   trial off statin... restart if not improving.  ? neuropathy cause of [pain  Change gabapentin to be higher dose but take AFTER dialysis, only on dialysis days.  If not improving she will make appt for in person exam... ? Back source of pain.  consider imaging.

## 2020-08-25 NOTE — Progress Notes (Signed)
VIRTUAL VISIT Due to national recommendations of social distancing due to [place] 19, a virtual visit is felt to be most appropriate for this patient at this time.   I connected with the patient on 08/25/20 at  2:00 PM EST by virtual telehealth platform and verified that I am speaking with the correct person using two identifiers.   \Interactive audio and video telecommunications were attempted between this provider and patient, however failed, due to patient having technical difficulties OR patient did not have access to video capability.  We continued and completed visit with audio only.   I discussed the limitations, risks, security and privacy concerns of performing an evaluation and management service by  virtual telehealth platform and the availability of in person appointments. I also discussed with the patient that there may be a patient responsible charge related to this service. The patient expressed understanding and agreed to proceed.  Patient location: Home Provider Location: [facility] Participants: Eliezer Lofts and [name]   Chief Complaint  Patient presents with  . Hospitalization Follow-up    History of Present Illness:   74 year old female with ESRD on dialysis presents for hospital; follow up . Admitted 11/9/to 11/11 for AMS after missed dialysis x 2 visits.   Copied hospital course as below for information purposes. Acute metabolic encephalopathy- likely uremic - CT head >Chronic ischemic microangiopathy and generalized atrophy - UDS negative - UA + for WBC but rare bacteria- I cannot tell if she is symptomatic but no fever (temp 99.1 today) or leukocytosis-  - MRI negative for acute issues- Neuro does not recommend further work up -appears this was related to uremia from missed dialysis- advised not to miss treatments - no symptoms of a UTI   Active Problems: Missed dialysis x 2 and hyperkalemia with ESRD SHPT - hyperkalemia due to missed  dialysis- given Lokelma - has been dialyzed on 11/10 and 11/11 and is now ready to go back to her normal dialysis schedule per nephrology  Anemia - of chronic disease with Folate deficiency - MCV was > 100 earlier in the admission but has drifted down to 90s -  B12 low normal at 223 -  Folate level low at 4.7 - start replacement for both- will be given 1 s/c injection of 1000 mcg prior to d/c  Unsteady gait - ? If related to B12- walked 80 ft but had weakness, balance deficits and decreased cognition yesterday-  PT recommends SNF but she declines this- she has no 24 hr caretaker either but is willing to take home health which she has done in the past  Diabetes mellitus due to underlying condition, controlled, with neurologic complication  - on Januvia at home - A1c was 4.1 on 06/16/20   Hypertension  - on Amlodipine and Metoprolol per home med list but also not taking Metoprolol per notes  - have resumed both   Closed traumatic minimally displaced fracture of two ribs - ? Chronic- no acute pain  COPD - no exacerbation- she has quit smoking    Today she reports she is doing better overall.  She is thinking more clearly.   She has started folic acid and G31 supplement. Good appetite.  FBS 99-104, no lows < 60, none > 200.   Not checking BP at home... at dialysis BPs are high... has started back on amlodipine and metoprolol.   Having some bilateral pain  in legs.  Increased pain with standing or walking but  less  with sitting. Pain in entire legs.  No better with lower dose of Januvia. No low back pain.  History of neuropathy Gabapentin at 200 mg is not helping pain    COVID 19 screen No recent travel or known exposure to COVID19 The patient denies respiratory symptoms of COVID 19 at this time.  The importance of social distancing was discussed today.   ROS    Past Medical History:  Diagnosis Date  . AKI (acute kidney injury) ([place]) 01/2020  . Cancer  [facility]) 2005   Breast Cancer  . COPD (chronic obstructive pulmonary disease) ([place])   . Depression   . Diabetes mellitus without complication ([place])   . Hyperlipidemia     reports that she has quit smoking. Her smoking use included cigarettes. She has a 40.00 pack-year smoking history. She has never used smokeless tobacco. She reports that she does not drink alcohol and does not use drugs.   Current Outpatient Medications:  .  albuterol (VENTOLIN HFA) 108 (90 Base) MCG/ACT inhaler, TAKE 2 PUFFS BY MOUTH EVERY 6 HOURS AS NEEDED FOR WHEEZE OR SHORTNESS OF BREATH, Disp: 8.5 g, Rfl: 2 .  amLODipine (NORVASC) 10 MG tablet, Take 1 tablet (10 mg total) by mouth daily., Disp: 30 tablet, Rfl: 0 .  folic acid (FOLVITE) 1 MG tablet, Take 1 tablet (1 mg total) by mouth daily., Disp: 30 tablet, Rfl: 3 .  gabapentin (NEURONTIN) 100 MG capsule, Take 200 mg by mouth at bedtime., Disp: , Rfl:  .  ipratropium-albuterol (DUONEB) 0.5-2.5 (3) MG/3ML SOLN, Take 3 mLs by nebulization every 6 (six) hours as needed., Disp: 360 mL, Rfl: 0 .  metoprolol tartrate (LOPRESSOR) 25 MG tablet, Take 1 tablet (25 mg total) by mouth 2 (two) times daily., Disp: 60 tablet, Rfl: 0 .  rosuvastatin (CRESTOR) 20 MG tablet, TAKE 1 TABLET BY MOUTH EVERY DAY, Disp: 90 tablet, Rfl: 3 .  sitaGLIPtin (JANUVIA) 25 MG tablet, Take 1 tablet (25 mg total) by mouth daily., Disp: 30 tablet, Rfl: 3 .  venlafaxine XR (EFFEXOR-XR) 37.5 MG 24 hr capsule, TAKE 1 CAPSULE BY MOUTH DAILY WITH BREAKFAST., Disp: 90 capsule, Rfl: 1 .  vitamin B-12 (CYANOCOBALAMIN) 1000 MCG tablet, Take 1 tablet (1,000 mcg total) by mouth daily., Disp: 30 tablet, Rfl: 0   Observations/Objective: Height 5' 1.8" (1.57 m).  Physical Exam  Physical Exam Constitutional:      General: The patient is not in acute distress. Pulmonary:     Effort: Pulmonary effort is normal. No respiratory distress.  Neurological:     Mental Status: The patient is alert and oriented to person,  place, and time.  Psychiatric:        Mood and Affect: Mood normal.        Behavior: Behavior normal.   Assessment and Plan    I discussed the assessment and treatment plan with the patient. The patient was provided an opportunity to ask questions and all were answered. The patient agreed with the plan and demonstrated an understanding of the instructions.   The patient was advised to call back or seek an in-person evaluation if the symptoms worsen or if the condition fails to improve as anticipated.   Total visit time 20 minutes, > 50% spent counseling and cordinating patients care.  Diabetes mellitus due to underlying condition, controlled, with neurologic complication ([place])  Good control on current regimen.  Hypertension associated with diabetes ([place])  Follow  At dialysis.[name] now back on amlodipine and BBlocker.  ESRD (end  stage renal disease) ([place]) Encouraged pt to not miss dialysis sessions.  Acute metabolic encephalopathy Resolving over time.. pt reports she is almost back at baseline.  Bilateral leg pain Unclear cause.  Ongoing x > 6 months per pt   trial off statin... restart if not improving.  ? neuropathy cause of [pain  Change gabapentin to be higher dose but take AFTER dialysis, only on dialysis days.  If not improving she will make appt for in person exam... ? Back source of pain.  consider imaging.    Eliezer Lofts, MD

## 2020-08-25 NOTE — Assessment & Plan Note (Signed)
Encouraged pt to not miss dialysis sessions.

## 2020-08-25 NOTE — Assessment & Plan Note (Signed)
Resolving over time.. pt reports she is almost back at baseline.

## 2020-08-25 NOTE — Assessment & Plan Note (Signed)
Follow  At dialysis.[name] now back on amlodipine and BBlocker.

## 2020-08-25 NOTE — Patient Instructions (Addendum)
Can hold crestor for 1 week.. to if leg pain improved.  Change gabapentin to 300 mg only on dialysis days after dialysis

## 2020-08-25 NOTE — Assessment & Plan Note (Signed)
Good control on current regimen. 

## 2020-08-26 DIAGNOSIS — N186 End stage renal disease: Secondary | ICD-10-CM | POA: Diagnosis not present

## 2020-08-26 DIAGNOSIS — D509 Iron deficiency anemia, unspecified: Secondary | ICD-10-CM | POA: Diagnosis not present

## 2020-08-26 DIAGNOSIS — D689 Coagulation defect, unspecified: Secondary | ICD-10-CM | POA: Diagnosis not present

## 2020-08-26 DIAGNOSIS — T8249XA Other complication of vascular dialysis catheter, initial encounter: Secondary | ICD-10-CM | POA: Diagnosis not present

## 2020-08-26 DIAGNOSIS — D688 Other specified coagulation defects: Secondary | ICD-10-CM | POA: Diagnosis not present

## 2020-08-26 DIAGNOSIS — Z992 Dependence on renal dialysis: Secondary | ICD-10-CM | POA: Diagnosis not present

## 2020-08-26 DIAGNOSIS — N2581 Secondary hyperparathyroidism of renal origin: Secondary | ICD-10-CM | POA: Diagnosis not present

## 2020-08-28 DIAGNOSIS — D689 Coagulation defect, unspecified: Secondary | ICD-10-CM | POA: Diagnosis not present

## 2020-08-28 DIAGNOSIS — N2581 Secondary hyperparathyroidism of renal origin: Secondary | ICD-10-CM | POA: Diagnosis not present

## 2020-08-28 DIAGNOSIS — Z992 Dependence on renal dialysis: Secondary | ICD-10-CM | POA: Diagnosis not present

## 2020-08-28 DIAGNOSIS — T8249XA Other complication of vascular dialysis catheter, initial encounter: Secondary | ICD-10-CM | POA: Diagnosis not present

## 2020-08-28 DIAGNOSIS — D509 Iron deficiency anemia, unspecified: Secondary | ICD-10-CM | POA: Diagnosis not present

## 2020-08-28 DIAGNOSIS — N186 End stage renal disease: Secondary | ICD-10-CM | POA: Diagnosis not present

## 2020-08-28 DIAGNOSIS — D688 Other specified coagulation defects: Secondary | ICD-10-CM | POA: Diagnosis not present

## 2020-08-29 ENCOUNTER — Other Ambulatory Visit: Payer: Self-pay

## 2020-08-29 NOTE — Patient Outreach (Signed)
[facility]) Care Management  08/29/2020  Cindy Burns 03-29-1946 [id number]   Telephone assessment:  Placed call to patient for the 3rd time without success. Left a message requesting a call back.  PLAN: will attempt a fourth outreach attempt in 4 weeks.  Tomasa Rand, RN, BSN, CEN [facility] [employer] [phone]

## 2020-08-30 DIAGNOSIS — N2581 Secondary hyperparathyroidism of renal origin: Secondary | ICD-10-CM | POA: Diagnosis not present

## 2020-08-30 DIAGNOSIS — D688 Other specified coagulation defects: Secondary | ICD-10-CM | POA: Diagnosis not present

## 2020-08-30 DIAGNOSIS — D509 Iron deficiency anemia, unspecified: Secondary | ICD-10-CM | POA: Diagnosis not present

## 2020-08-30 DIAGNOSIS — Z992 Dependence on renal dialysis: Secondary | ICD-10-CM | POA: Diagnosis not present

## 2020-08-30 DIAGNOSIS — N186 End stage renal disease: Secondary | ICD-10-CM | POA: Diagnosis not present

## 2020-08-30 DIAGNOSIS — D689 Coagulation defect, unspecified: Secondary | ICD-10-CM | POA: Diagnosis not present

## 2020-08-30 DIAGNOSIS — T8249XA Other complication of vascular dialysis catheter, initial encounter: Secondary | ICD-10-CM | POA: Diagnosis not present

## 2020-09-02 DIAGNOSIS — N186 End stage renal disease: Secondary | ICD-10-CM | POA: Diagnosis not present

## 2020-09-02 DIAGNOSIS — N2581 Secondary hyperparathyroidism of renal origin: Secondary | ICD-10-CM | POA: Diagnosis not present

## 2020-09-02 DIAGNOSIS — D688 Other specified coagulation defects: Secondary | ICD-10-CM | POA: Diagnosis not present

## 2020-09-02 DIAGNOSIS — T8249XA Other complication of vascular dialysis catheter, initial encounter: Secondary | ICD-10-CM | POA: Diagnosis not present

## 2020-09-02 DIAGNOSIS — D509 Iron deficiency anemia, unspecified: Secondary | ICD-10-CM | POA: Diagnosis not present

## 2020-09-02 DIAGNOSIS — Z992 Dependence on renal dialysis: Secondary | ICD-10-CM | POA: Diagnosis not present

## 2020-09-02 DIAGNOSIS — D689 Coagulation defect, unspecified: Secondary | ICD-10-CM | POA: Diagnosis not present

## 2020-09-05 ENCOUNTER — Observation Stay (HOSPITAL_COMMUNITY)
Admission: EM | Admit: 2020-09-05 | Discharge: 2020-09-06 | Disposition: A | Discharge status: Home / Self Care | Payer: Medicare Other | Attending: Family Medicine | Admitting: Family Medicine

## 2020-09-05 ENCOUNTER — Observation Stay (HOSPITAL_COMMUNITY): Payer: Medicare Other

## 2020-09-05 ENCOUNTER — Encounter (HOSPITAL_COMMUNITY): Payer: Self-pay | Admitting: Emergency Medicine

## 2020-09-05 ENCOUNTER — Other Ambulatory Visit: Payer: Self-pay

## 2020-09-05 ENCOUNTER — Emergency Department (HOSPITAL_COMMUNITY): Payer: Medicare Other

## 2020-09-05 DIAGNOSIS — G928 Other toxic encephalopathy: Secondary | ICD-10-CM | POA: Diagnosis not present

## 2020-09-05 DIAGNOSIS — R4781 Slurred speech: Secondary | ICD-10-CM | POA: Diagnosis not present

## 2020-09-05 DIAGNOSIS — R4182 Altered mental status, unspecified: Secondary | ICD-10-CM | POA: Diagnosis not present

## 2020-09-05 DIAGNOSIS — I120 Hypertensive chronic kidney disease with stage 5 chronic kidney disease or end stage renal disease: Secondary | ICD-10-CM | POA: Diagnosis not present

## 2020-09-05 DIAGNOSIS — Z743 Need for continuous supervision: Secondary | ICD-10-CM | POA: Diagnosis not present

## 2020-09-05 DIAGNOSIS — T8249XA Other complication of vascular dialysis catheter, initial encounter: Secondary | ICD-10-CM | POA: Diagnosis not present

## 2020-09-05 DIAGNOSIS — Z853 Personal history of malignant neoplasm of breast: Secondary | ICD-10-CM | POA: Insufficient documentation

## 2020-09-05 DIAGNOSIS — Z79899 Other long term (current) drug therapy: Secondary | ICD-10-CM | POA: Diagnosis not present

## 2020-09-05 DIAGNOSIS — Z992 Dependence on renal dialysis: Secondary | ICD-10-CM | POA: Insufficient documentation

## 2020-09-05 DIAGNOSIS — N179 Acute kidney failure, unspecified: Secondary | ICD-10-CM | POA: Diagnosis not present

## 2020-09-05 DIAGNOSIS — D689 Coagulation defect, unspecified: Secondary | ICD-10-CM | POA: Diagnosis not present

## 2020-09-05 DIAGNOSIS — D509 Iron deficiency anemia, unspecified: Secondary | ICD-10-CM | POA: Diagnosis not present

## 2020-09-05 DIAGNOSIS — J449 Chronic obstructive pulmonary disease, unspecified: Secondary | ICD-10-CM | POA: Diagnosis not present

## 2020-09-05 DIAGNOSIS — R410 Disorientation, unspecified: Secondary | ICD-10-CM | POA: Diagnosis present

## 2020-09-05 DIAGNOSIS — N186 End stage renal disease: Secondary | ICD-10-CM | POA: Diagnosis not present

## 2020-09-05 DIAGNOSIS — R404 Transient alteration of awareness: Secondary | ICD-10-CM | POA: Diagnosis not present

## 2020-09-05 DIAGNOSIS — I499 Cardiac arrhythmia, unspecified: Secondary | ICD-10-CM | POA: Diagnosis not present

## 2020-09-05 DIAGNOSIS — Z87891 Personal history of nicotine dependence: Secondary | ICD-10-CM | POA: Insufficient documentation

## 2020-09-05 DIAGNOSIS — I10 Essential (primary) hypertension: Secondary | ICD-10-CM | POA: Diagnosis not present

## 2020-09-05 DIAGNOSIS — R29818 Other symptoms and signs involving the nervous system: Secondary | ICD-10-CM | POA: Diagnosis not present

## 2020-09-05 DIAGNOSIS — E1142 Type 2 diabetes mellitus with diabetic polyneuropathy: Secondary | ICD-10-CM | POA: Diagnosis not present

## 2020-09-05 DIAGNOSIS — R4020 Unspecified coma: Secondary | ICD-10-CM | POA: Diagnosis not present

## 2020-09-05 DIAGNOSIS — N2581 Secondary hyperparathyroidism of renal origin: Secondary | ICD-10-CM | POA: Diagnosis not present

## 2020-09-05 DIAGNOSIS — R29810 Facial weakness: Secondary | ICD-10-CM | POA: Diagnosis not present

## 2020-09-05 DIAGNOSIS — Z20822 Contact with and (suspected) exposure to covid-19: Secondary | ICD-10-CM | POA: Insufficient documentation

## 2020-09-05 DIAGNOSIS — J90 Pleural effusion, not elsewhere classified: Secondary | ICD-10-CM | POA: Diagnosis not present

## 2020-09-05 DIAGNOSIS — D688 Other specified coagulation defects: Secondary | ICD-10-CM | POA: Diagnosis not present

## 2020-09-05 LAB — CBC WITH DIFFERENTIAL/PLATELET
Abs Immature Granulocytes: 0.05 10*3/uL (ref 0.00–0.07)
Basophils Absolute: 0 10*3/uL (ref 0.0–0.1)
Basophils Relative: 1 %
Eosinophils Absolute: 0.1 10*3/uL (ref 0.0–0.5)
Eosinophils Relative: 2 %
HCT: 30.9 % — ABNORMAL LOW (ref 36.0–46.0)
Hemoglobin: 10.2 g/dL — ABNORMAL LOW (ref 12.0–15.0)
Immature Granulocytes: 1 %
Lymphocytes Relative: 13 %
Lymphs Abs: 0.6 10*3/uL — ABNORMAL LOW (ref 0.7–4.0)
MCH: 30.9 pg (ref 26.0–34.0)
MCHC: 33 g/dL (ref 30.0–36.0)
MCV: 93.6 fL (ref 80.0–100.0)
Monocytes Absolute: 0.3 10*3/uL (ref 0.1–1.0)
Monocytes Relative: 5 %
Neutro Abs: 3.7 10*3/uL (ref 1.7–7.7)
Neutrophils Relative %: 78 %
Platelets: 175 10*3/uL (ref 150–400)
RBC: 3.3 MIL/uL — ABNORMAL LOW (ref 3.87–5.11)
RDW: 15.6 % — ABNORMAL HIGH (ref 11.5–15.5)
WBC: 4.8 10*3/uL (ref 4.0–10.5)
nRBC: 0 % (ref 0.0–0.2)

## 2020-09-05 LAB — COMPREHENSIVE METABOLIC PANEL
ALT: 12 U/L (ref 0–44)
AST: 20 U/L (ref 15–41)
Albumin: 3.2 g/dL — ABNORMAL LOW (ref 3.5–5.0)
Alkaline Phosphatase: 63 U/L (ref 38–126)
Anion gap: 15 (ref 5–15)
BUN: 23 mg/dL (ref 8–23)
CO2: 27 mmol/L (ref 22–32)
Calcium: 7.2 mg/dL — ABNORMAL LOW (ref 8.9–10.3)
Chloride: 94 mmol/L — ABNORMAL LOW (ref 98–111)
Creatinine, Ser: 3.32 mg/dL — ABNORMAL HIGH (ref 0.44–1.00)
GFR, Estimated: 14 mL/min — ABNORMAL LOW (ref >60)
Glucose, Bld: 123 mg/dL — ABNORMAL HIGH (ref 70–99)
Potassium: 3.9 mmol/L (ref 3.5–5.1)
Sodium: 136 mmol/L (ref 135–145)
Total Bilirubin: 0.7 mg/dL (ref 0.3–1.2)
Total Protein: 6.6 g/dL (ref 6.5–8.1)

## 2020-09-05 LAB — I-STAT CHEM 8, ED
BUN: 25 mg/dL — ABNORMAL HIGH (ref 8–23)
Calcium, Ion: 0.77 mmol/L — CL (ref 1.15–1.40)
Chloride: 95 mmol/L — ABNORMAL LOW (ref 98–111)
Creatinine, Ser: 3.4 mg/dL — ABNORMAL HIGH (ref 0.44–1.00)
Glucose, Bld: 117 mg/dL — ABNORMAL HIGH (ref 70–99)
HCT: 30 % — ABNORMAL LOW (ref 36.0–46.0)
Hemoglobin: 10.2 g/dL — ABNORMAL LOW (ref 12.0–15.0)
Potassium: 3.8 mmol/L (ref 3.5–5.1)
Sodium: 136 mmol/L (ref 135–145)
TCO2: 26 mmol/L (ref 22–32)

## 2020-09-05 LAB — TROPONIN I (HIGH SENSITIVITY): Troponin I (High Sensitivity): 83 ng/L — ABNORMAL HIGH (ref <18)

## 2020-09-05 LAB — PROTIME-INR
INR: 1.1 (ref 0.8–1.2)
Prothrombin Time: 13.4 seconds (ref 11.4–15.2)

## 2020-09-05 LAB — AMMONIA: Ammonia: 40 umol/L — ABNORMAL HIGH (ref 9–35)

## 2020-09-05 LAB — RESP PANEL BY RT-PCR (FLU A&B, COVID) ARPGX2
Influenza A by PCR: NEGATIVE
Influenza B by PCR: NEGATIVE
SARS Coronavirus 2 by RT PCR: NEGATIVE

## 2020-09-05 LAB — CBG MONITORING, ED
Glucose-Capillary: 119 mg/dL — ABNORMAL HIGH (ref 70–99)
Glucose-Capillary: 157 mg/dL — ABNORMAL HIGH (ref 70–99)

## 2020-09-05 LAB — APTT: aPTT: 30 seconds (ref 24–36)

## 2020-09-05 MED ORDER — ACETAMINOPHEN 325 MG PO TABS: 650.0000 mg | ORAL_TABLET | Freq: Four times a day (QID) | ORAL | Status: DC | PRN

## 2020-09-05 MED ORDER — FOLIC ACID 1 MG PO TABS: 1.0000 mg | ORAL_TABLET | Freq: Every day | ORAL | Status: DC

## 2020-09-05 MED ORDER — SODIUM CHLORIDE 0.9% FLUSH
3.0000 mL | Freq: Once | INTRAVENOUS | Status: DC
Start: 2020-09-05 — End: 2020-09-06

## 2020-09-05 MED ORDER — ENOXAPARIN SODIUM 40 MG/0.4ML ~~LOC~~ SOLN: 40.0000 mg | SUBCUTANEOUS | Status: DC

## 2020-09-05 MED ORDER — ACETAMINOPHEN 650 MG RE SUPP: 650.0000 mg | Freq: Four times a day (QID) | RECTAL | Status: DC | PRN

## 2020-09-05 MED ORDER — IPRATROPIUM-ALBUTEROL 0.5-2.5 (3) MG/3ML IN SOLN: 3.0000 mL | Freq: Four times a day (QID) | RESPIRATORY_TRACT | Status: DC | PRN

## 2020-09-05 MED ORDER — STROKE: EARLY STAGES OF RECOVERY BOOK: Freq: Once | Status: DC

## 2020-09-05 MED ORDER — TRAZODONE HCL 50 MG PO TABS: 25.0000 mg | ORAL_TABLET | Freq: Every evening | ORAL | Status: DC | PRN

## 2020-09-05 MED ORDER — HEPARIN SODIUM (PORCINE) 5000 UNIT/ML IJ SOLN
5000.0000 [IU] | Freq: Three times a day (TID) | INTRAMUSCULAR | Status: DC
  Administered 2020-09-06: 5000 [IU] via SUBCUTANEOUS
  Filled 2020-09-05: qty 1

## 2020-09-05 MED ORDER — ONDANSETRON HCL 4 MG PO TABS: 4.0000 mg | ORAL_TABLET | Freq: Four times a day (QID) | ORAL | Status: DC | PRN

## 2020-09-05 MED ORDER — INSULIN ASPART 100 UNIT/ML ~~LOC~~ SOLN: 0.0000 [IU] | Freq: Three times a day (TID) | SUBCUTANEOUS | Status: DC

## 2020-09-05 MED ORDER — AMLODIPINE BESYLATE 5 MG PO TABS: 10.0000 mg | ORAL_TABLET | Freq: Every day | ORAL | Status: DC

## 2020-09-05 MED ORDER — ASPIRIN EC 81 MG PO TBEC: 81.0000 mg | DELAYED_RELEASE_TABLET | Freq: Every day | ORAL | Status: DC

## 2020-09-05 MED ORDER — MAGNESIUM HYDROXIDE 400 MG/5ML PO SUSP
30.0000 mL | Freq: Every day | ORAL | Status: DC | PRN
  Filled 2020-09-05: qty 30

## 2020-09-05 MED ORDER — VENLAFAXINE HCL ER 37.5 MG PO CP24
37.5000 mg | ORAL_CAPSULE | Freq: Every day | ORAL | Status: DC
  Administered 2020-09-06: 37.5 mg via ORAL
  Filled 2020-09-05 (×2): qty 1

## 2020-09-05 MED ORDER — SODIUM CHLORIDE 0.9 % IV SOLN: INTRAVENOUS | Status: DC

## 2020-09-05 MED ORDER — METOPROLOL TARTRATE 25 MG PO TABS
25.0000 mg | ORAL_TABLET | Freq: Two times a day (BID) | ORAL | Status: DC
  Administered 2020-09-06: 25 mg via ORAL
  Filled 2020-09-05: qty 1

## 2020-09-05 MED ORDER — ROSUVASTATIN CALCIUM 20 MG PO TABS: 20.0000 mg | ORAL_TABLET | Freq: Every day | ORAL | Status: DC

## 2020-09-05 MED ORDER — VITAMIN B-12 1000 MCG PO TABS: 1000.0000 ug | ORAL_TABLET | Freq: Every day | ORAL | Status: DC

## 2020-09-05 MED ORDER — GABAPENTIN 300 MG PO CAPS: 300.0000 mg | ORAL_CAPSULE | ORAL | Status: DC

## 2020-09-05 MED ORDER — LINAGLIPTIN 5 MG PO TABS: 5.0000 mg | ORAL_TABLET | Freq: Every day | ORAL | Status: DC

## 2020-09-05 MED ORDER — ONDANSETRON HCL 4 MG/2ML IJ SOLN: 4.0000 mg | Freq: Four times a day (QID) | INTRAMUSCULAR | Status: DC | PRN

## 2020-09-05 NOTE — ED Notes (Signed)
Pt to MRI via stretcher.

## 2020-09-05 NOTE — Consult Note (Addendum)
Neurology Consult  CC: CODE STROKE  History is obtained from: EMS  HPI: Cindy Burns is a 74 y.o. female with a PMHX of ESRD on HD t, th, s, COPD, DM II, breast cancer, depression, hx metabolic encephalopathy, and non compliance with HD. Per EMS, Cindy Burns was at HD today and right after session ended, she became confused with expressive aphasia and perseveration. She did not exhibit any weakness.   Pt was admitted here on 08/15/20 after being found down with AMS. She had missed 2 HD appts. Neuro consulted at that time and pt was diagnosed with metabolic encephalopathy. MRI at that admission was neg for acute infarct. Positive for mild generalized cerebral atrophy and chronic small vessel ischemic disease.  After metabolic workup and HD, pt was discharged.   In review of chart, it would appear that pt has had difficulties in the past with encephalopathy documented at her PCP.   On exam, pt knows her name, but perseverates on further questioning. She names a watch and repeats sentences. No weakness of extremity noted. No facial droop. No dysarthria. Follows most commands.   Attending addendum: Attempted to reach the friends number listed on the chart as emergency contact with no response.  LKW: 1400 hrs.  tpa given?: No, stroke not suspected. IR Thrombectomy? No  NIHSS: 3  ROS: Pt can not participate in complete ROS due  to assess due to altered mental status.   Past Medical History:  Diagnosis Date  . AKI (acute kidney injury) ([place]) 01/2020  . Cancer [facility]) 2005   Breast Cancer  . COPD (chronic obstructive pulmonary disease) ([place])   . Depression   . Diabetes mellitus without complication ([place])   . Hyperlipidemia      Family History  Problem Relation Age of Onset  . Arthritis Mother   . Alzheimer's disease Mother   . Dementia Father   . COPD Brother   . COPD Brother     Social History:  reports that she has quit smoking. Her smoking use included cigarettes. She has a 40.00  pack-year smoking history. She has never used smokeless tobacco. She reports that she does not drink alcohol and does not use drugs.   Prior to Admission medications   Medication Sig Start Date End Date Taking? Authorizing Provider  albuterol (VENTOLIN HFA) 108 (90 Base) MCG/ACT inhaler TAKE 2 PUFFS BY MOUTH EVERY 6 HOURS AS NEEDED FOR WHEEZE OR SHORTNESS OF BREATH 04/24/20   Bedsole, Amy E, MD  amLODipine (NORVASC) 10 MG tablet Take 1 tablet (10 mg total) by mouth daily. 02/20/20 04/29/29  Alma Friendly, MD  folic acid (FOLVITE) 1 MG tablet Take 1 tablet (1 mg total) by mouth daily. 08/17/20 08/17/21  Debbe Odea, MD  gabapentin (NEURONTIN) 300 MG capsule 300 mg after dialysis only on dialysis days 08/25/20   Bedsole, Amy E, MD  ipratropium-albuterol (DUONEB) 0.5-2.5 (3) MG/3ML SOLN Take 3 mLs by nebulization every 6 (six) hours as needed. 02/19/20 04/29/29  Alma Friendly, MD  metoprolol tartrate (LOPRESSOR) 25 MG tablet Take 1 tablet (25 mg total) by mouth 2 (two) times daily. 08/17/20 09/16/20  Debbe Odea, MD  rosuvastatin (CRESTOR) 20 MG tablet TAKE 1 TABLET BY MOUTH EVERY DAY 07/27/20   Bedsole, Amy E, MD  sitaGLIPtin (JANUVIA) 25 MG tablet Take 1 tablet (25 mg total) by mouth daily. 06/19/20   Bedsole, Amy E, MD  venlafaxine XR (EFFEXOR-XR) 37.5 MG 24 hr capsule TAKE 1 CAPSULE BY MOUTH DAILY WITH BREAKFAST.  03/20/20   Bedsole, Amy E, MD  vitamin B-12 (CYANOCOBALAMIN) 1000 MCG tablet Take 1 tablet (1,000 mcg total) by mouth daily. 08/17/20   Debbe Odea, MD    Exam: Current vital signs: There were no vitals taken for this visit.  Physical Exam  Constitutional: Appears well-developed and well-nourished.  Psych: Affect appropriate to situation Eyes: No scleral injection HENT: No OP obstrucion Head: Normocephalic.  Cardiovascular: Normal rate and regular rhythm.  Respiratory: Effort normal and breath sounds normal to anterior ascultation GI: Soft.  No distension. There is  no tenderness.  Skin: WDI Neurological exam Patient is awake, alert, oriented to self. For the month, she said December-it is November 30 today. She was unable to tell me her correct age. She kept on perseverating her date of birth. She was able to repeat sentences without problem. She has no dysarthria Follows some commands but inconsistently Poor attention concentration Cranial nerve examination: Pupils equal round react light, extra movements intact, visual field full, face symmetric, facial sensation intact. Motor exam: Both upper extremities are 5/5 without drift. No gross asterixis noted. Both lower extremities are antigravity with drift. Sensory exam: Intact to touch Coordination: Difficult to perform due to her mentation NIH stroke scale   I have reviewed labs in epic and the pertinent results are: labs are pending.    I have reviewed the images obtained: NCT head showed no acute infarct.   Assessment: Cindy Burns is a 74 y.o. female PMHx of ESRD on HD, COPD, depression, hx breast cancer, non compliance with HD, encephalopathy with recent admission on 08/15/20 who became acutely confused after HD session today. Thought to also have expressive aphasia due to delay in answering questions. Brought to ED by EMS and code stroke was activated. CT head without acute infarct.   Impression: acute metabolic encephalopathy with risk factors for stroke.  Other considerations include dialysis disequilibrium syndrome.  Has a history of missing dialysis in the past.  Plan: - MRI brain without contrast if pt able to cooperate. -follow metabolic labs including ammonia -If symptoms do not improve in the emergency room, I would recommend admission for observation and further medical work-up including infectious work-up.   Plan discussed with ED MD by Dr. Rory [name].   Attending addendum Patient seen and examined as an acute code stroke for inability to bring out words and appearing confused.  Has been seen in the past few weeks for similar episodes deemed to be toxic metabolic encephalopathy. Had received dialysis today and was suddenly confused after. Blood pressures were mildly higher in the 150s 160s range. Sugar levels were unremarkable Exam with reduced estrogen concentration, perseveration, intact fluency and repetition, intermittent following commands, no gross cranial nerve, motor or sensory deficits-does have bilateral leg weakness which is nonfocal. Noncontrast head CT which I have reviewed personally with no acute changes. Labs reveal ammonia mildly elevated at 40 BMP and CBC pending.  Labs drawn clotted.  Redrawing the labs. Does not appear to have any meningitic signs Agree with plan as above, which I helped formulate. Discussed my plan with Dr. Maryan Rued.   CRITICAL CARE ATTESTATION Performed by: Amie Portland, MD Total critical care time: 33minutes Critical care time was exclusive of separately billable procedures and treating other patients and/or supervising APPs/Residents/Students Critical care was necessary to treat or prevent imminent or life-threatening deterioration due to toxic metabolic encephalopathy, strokelike episode This patient is critically ill and at significant risk for neurological worsening and/or death and care requires constant monitoring. Critical  care was time spent personally by me on the following activities: development of treatment plan with patient and/or surrogate as well as nursing, discussions with consultants, evaluation of patient's response to treatment, examination of patient, obtaining history from patient or surrogate, ordering and performing treatments and interventions, ordering and review of laboratory studies, ordering and review of radiographic studies, pulse oximetry, re-evaluation of patient's condition, participation in multidisciplinary rounds and medical decision making of high complexity in the care of this  patient.   Clance Boll, NP. Pt seen with Dr. Rory [name]. Note to be edited as needed by MD.   09/05/2020, 5:43 PM

## 2020-09-05 NOTE — ED Triage Notes (Signed)
Pt to ED via [facility] from [place] who st's pt was last known well at 14:00 today.  Pt to ED with altered mental status

## 2020-09-05 NOTE — ED Notes (Signed)
Pt to Xray via stretcher.

## 2020-09-05 NOTE — ED Notes (Signed)
MRI contacted this RN advising that pt unable to answer orientation questions appropriately; requesting additional testing to rule out infectious causation/ensure stability for MRI. MD paged for same

## 2020-09-05 NOTE — Code Documentation (Signed)
Stroke Response Nurse Documentation Code Documentation  Cindy Burns is a 74 y.o. female arriving to [place] ED via [facility] on 09/05/20 with past medical hx of depression, diabetes, hyperlipidemia, COPD, breast cancer '05, and acute kidney injury.   Code stroke was activated by EMS. Patient from dialysis center where she was LKW at 1400. She completed dialysis treatment and now complaining of confusion and aphasia.    Stroke team at the bedside on patient arrival. Labs drawn and patient cleared for CT by Dr. Maryan Rued. Patient to CT with team. NIHSS 3, see documentation for details and code stroke times. Patient with disoriented and Expressive aphasia  on exam. The following imaging was completed: CT. Patient is not a candidate for tPA due to stroke not suspected. Care/Plan Q2 VS/neuro. Bedside handoff with ED RN Maudie Mercury.    Leverne Humbles Stroke Response RN

## 2020-09-05 NOTE — ED Notes (Signed)
Pt transported to CT, vitals cannot be updated at this time

## 2020-09-05 NOTE — H&P (Addendum)
[facility]  Admission H&P  PATIENT NAME: Cindy Burns    MR#:  [id number]  DATE OF BIRTH:  11/12/1945  DATE OF ADMISSION:  09/05/2020  PRIMARY CARE PHYSICIAN: [name], MD   REQUESTING/REFERRING PHYSICIAN: Blanchie Dessert, MD  CHIEF COMPLAINT:   Chief Complaint  Patient presents with  . Code Stroke    HISTORY OF PRESENT ILLNESS:  Cindy Burns  is a 74 y.o. Caucasian female with a known history of end-stage renal disease on hemodialysis, COPD, depression, type diabetes mellitus and dyslipidemia, presented to the emergency room with acute onset of altered mental status with confusion and expressive dysphagia noted by her hemodialysis staff when she finished today.  She denies any paresthesias or focal muscle weakness.  No dysphagia or facial droop or gait abnormalities.  No vertigo or tinnitus.  She denies any chest pain or dyspnea or palpitations.  No nausea or vomiting.  She makes some urine but denies any dysuria, or hematuria or frequency urgency or flank pain.  No fever or chills.  When she came to the ER, blood pressure was 175/80 with heart rate 102 with otherwise normal vital signs.  Labs revealed mild anemia close to baseline.  BUN and creatinine were 25 and 3.4.  Influenza antigens and COVID-19 PCR came back negative. EKG showed normal sinus rhythm with rate of 97 with T wave inversion in V1 through V5 and and aVL with flattening T wave in lead I.  It showed Q waves in lead III.  Noncontrasted CT scan showed no acute intracranial normalities.  It showed cerebral mild atrophy and chronic small vessel ischemic disease and prominent  redemonstrated perivascular space versus chronic lacunar infarct within the left thalamus.  The patient was evaluated by Dr. Rory [name] in the ER initially for code stroke with recommendation for brain MRI without contrast and following labs for metabolic encephalopathy.  She will be admitted to an observation medical monitored bed for  further evaluation and management.  PAST MEDICAL HISTORY:   Past Medical History:  Diagnosis Date  . AKI (acute kidney injury) ([place]) 01/2020  . Cancer [facility]) 2005   Breast Cancer  . COPD (chronic obstructive pulmonary disease) ([place])   . Depression   . Diabetes mellitus without complication ([place])   . Hyperlipidemia   - ESRD on hemodialysis on TTS  PAST SURGICAL HISTORY:   Past Surgical History:  Procedure Laterality Date  . BREAST SURGERY  2005  . CATARACT EXTRACTION    . CHOLECYSTECTOMY  2013  . IR FLUORO GUIDE CV LINE RIGHT  02/01/2020  . IR US GUIDE VASC ACCESS RIGHT  02/01/2020    SOCIAL HISTORY:   Social History   Tobacco Use  . Smoking status: Former Smoker    Packs/day: 1.00    Years: 40.00    Pack years: 40.00    Types: Cigarettes  . Smokeless tobacco: Never Used  Substance Use Topics  . Alcohol use: No    FAMILY HISTORY:   Family History  Problem Relation Age of Onset  . Arthritis Mother   . Alzheimer's disease Mother   . Dementia Father   . COPD Brother   . COPD Brother     DRUG ALLERGIES:  No Known Allergies  REVIEW OF SYSTEMS:   ROS As per history of present illness. All pertinent systems were reviewed above. Constitutional, HEENT, cardiovascular, respiratory, GI, GU, musculoskeletal, neuro, psychiatric, endocrine, integumentary and hematologic systems were reviewed and are otherwise negative/unremarkable except for positive  findings mentioned above in the HPI.   MEDICATIONS AT HOME:   Prior to Admission medications   Medication Sig Start Date End Date Taking? Authorizing Provider  albuterol (VENTOLIN HFA) 108 (90 Base) MCG/ACT inhaler TAKE 2 PUFFS BY MOUTH EVERY 6 HOURS AS NEEDED FOR WHEEZE OR SHORTNESS OF BREATH 04/24/20   Bedsole, Amy E, MD  amLODipine (NORVASC) 10 MG tablet Take 1 tablet (10 mg total) by mouth daily. 02/20/20 04/29/29  Alma Friendly, MD  folic acid (FOLVITE) 1 MG tablet Take 1 tablet (1 mg total) by mouth daily.  08/17/20 08/17/21  Debbe Odea, MD  gabapentin (NEURONTIN) 300 MG capsule 300 mg after dialysis only on dialysis days 08/25/20   Bedsole, Amy E, MD  ipratropium-albuterol (DUONEB) 0.5-2.5 (3) MG/3ML SOLN Take 3 mLs by nebulization every 6 (six) hours as needed. 02/19/20 04/29/29  Alma Friendly, MD  metoprolol tartrate (LOPRESSOR) 25 MG tablet Take 1 tablet (25 mg total) by mouth 2 (two) times daily. 08/17/20 09/16/20  Debbe Odea, MD  rosuvastatin (CRESTOR) 20 MG tablet TAKE 1 TABLET BY MOUTH EVERY DAY 07/27/20   Bedsole, Amy E, MD  sitaGLIPtin (JANUVIA) 25 MG tablet Take 1 tablet (25 mg total) by mouth daily. 06/19/20   Bedsole, Amy E, MD  venlafaxine XR (EFFEXOR-XR) 37.5 MG 24 hr capsule TAKE 1 CAPSULE BY MOUTH DAILY WITH BREAKFAST. 03/20/20   Bedsole, Amy E, MD  vitamin B-12 (CYANOCOBALAMIN) 1000 MCG tablet Take 1 tablet (1,000 mcg total) by mouth daily. 08/17/20   Debbe Odea, MD      VITAL SIGNS:  Blood pressure (!) 175/80, pulse (!) 102, temperature 98.3 F (36.8 C), temperature source Oral, resp. rate 17, weight 51.8 kg, SpO2 100 %.  PHYSICAL EXAMINATION:  Physical Exam  GENERAL:  74 y.o.-year-old Caucasian female patient lying in the bed with no acute distress.  She slowly follows commands. EYES: Pupils equal, round, reactive to light and accommodation. No scleral icterus. Extraocular muscles intact.  HEENT: Head atraumatic, normocephalic. Oropharynx and nasopharynx clear.  NECK:  Supple, no jugular venous distention. No thyroid enlargement, no tenderness.  LUNGS: Normal breath sounds bilaterally, no wheezing, rales,rhonchi or crepitation. No use of accessory muscles of respiration.  CARDIOVASCULAR: Regular rate and rhythm, S1, S2 normal. No murmurs, rubs, or gallops.  ABDOMEN: Soft, nondistended, nontender. Bowel sounds present. No organomegaly or mass.  EXTREMITIES: No pedal edema, cyanosis, or clubbing.  NEUROLOGIC: Cranial nerves II through XII are intact. Muscle  strength 5/5 in all extremities. Sensation intact. Gait not checked.  She follows commands most of the time and sometimes with repetition. PSYCHIATRIC: The patient is alert and cooperative.  She was oriented to her name and occasionally disoriented otherwise.  Normal affect and good eye contact. SKIN: No obvious rash, lesion, or ulcer.   LABORATORY PANEL:   CBC Recent Labs  Lab 09/05/20 1749  WBC 4.8  HGB 10.2*  HCT 30.9*  PLT 175   ------------------------------------------------------------------------------------------------------------------  Chemistries  Recent Labs  Lab 09/05/20 1717 09/05/20 1717 09/05/20 1722  NA 136   < > 136  K 3.9   < > 3.8  CL 94*   < > 95*  CO2 27  --   --   GLUCOSE 123*   < > 117*  BUN 23   < > 25*  CREATININE 3.32*   < > 3.40*  CALCIUM 7.2*  --   --   AST 20  --   --   ALT 12  --   --  ALKPHOS 63  --   --   BILITOT 0.7  --   --    < > = values in this interval not displayed.   ------------------------------------------------------------------------------------------------------------------  Cardiac Enzymes No results for input(s): TROPONINI in the last 168 hours. ------------------------------------------------------------------------------------------------------------------  RADIOLOGY:  CT HEAD CODE STROKE WO CONTRAST  Result Date: 09/05/2020 CLINICAL DATA:  Code stroke. Neuro deficit, acute, stroke suspected. Altered mental status, aphasia. EXAM: CT HEAD WITHOUT CONTRAST TECHNIQUE: Contiguous axial images were obtained from the base of the skull through the vertex without intravenous contrast. COMPARISON:  Brain MRI 08/16/2020. FINDINGS: Brain: Mildly motion degraded examination. Mild generalized cerebral atrophy. Mild periventricular ill-defined hypoattenuation is nonspecific, but compatible with chronic small vessel ischemic disease. Redemonstrated prominent perivascular space versus small chronic lacunar infarct within the left  thalamus (series 5, image 14). There is no acute intracranial hemorrhage. No demarcated cortical infarct. No extra-axial fluid collection. No evidence of intracranial mass. No midline shift. Vascular: No hyperdense vessel.  Atherosclerotic calcifications. Skull: Normal. Negative for fracture or focal lesion. Sinuses/Orbits: Visualized orbits show no acute finding. ASPECTS ([place] Stroke Program Early CT Score) - Ganglionic level infarction (caudate, lentiform nuclei, internal capsule, insula, M1-M3 cortex): 7 - Supraganglionic infarction (M4-M6 cortex): 3 Total score (0-10 with 10 being normal): 10 These results were communicated to Dr. Rory [name] At 5:46 pmon 11/30/2021by text page via the [facility] messaging system. IMPRESSION: Mildly motion degraded examination. No evidence of acute intracranial abnormality.  ASPECTS is 10. Mild cerebral atrophy and chronic small vessel ischemic disease. Redemonstrated prominent perivascular space versus chronic lacunar infarct within the left thalamus. Electronically Signed   By: Kellie Simmering DO   On: 09/05/2020 17:47      IMPRESSION AND PLAN:   1.  Acute encephalopathy/confusion with differential diagnosis including metabolic encephalopathy, less likely acute CVA. -The patient will be admitted to a medical monitored bed. -We will follow neuro checks every 4 hours with 24 hours. -She will be placed on aspirin. -We will obtain brain MRI without contrast in a.m. for further assessment. -Neurology consult was obtained for follow-up.  Dr. Rory [name] is aware about the patient. -We will obtain a cath UA, ammonia level, chest x-ray, abdomen/pelvis x-ray for further assessment.  2.  Uncontrolled hypertension. -Permissive hypertension will be allowed pending rule out CVA. -Her antihypertensives will be continued with permissive parameters.  3.  Type 2 diabetes mellitus with peripheral neuropathy. -She will be placed on supplement coverage with NovoLog and will continue her  Januvia. -We will continue her Neurontin.  4.  End-stage renal disease on hemodialysis. -Nephrology consultation can be obtained as needed if she is still here till Thursday for hemodialysis.  5.  Dyslipidemia. -We will check fasting lipids and continue her statin therapy.  6.  Depression. -Continue Effexor XR.  7.  Vitamin B12 deficiency. -We will continue her vitamin B12.  8.  DVT prophylaxis. -Subcutaneous heparin.   All the records are reviewed and case discussed with ED provider. The plan of care was discussed in details with the patient (and family). I answered all questions. The patient agreed to proceed with the above mentioned plan. Further management will depend upon hospital course.   CODE STATUS: Full code  Status is: Observation  The patient remains OBS appropriate and will d/c before 2 midnights.  Dispo: The patient is from: Home              Anticipated d/c is to: Home  Anticipated d/c date is: 1 day              Patient currently is not medically stable to d/c.   TOTAL TIME TAKING CARE OF THIS PATIENT: 55 minutes.  Addendum: The patient decided to leave [facility] from the emergency room.  She was alert and oriented x3.  She understands the risks involved including recurrent CVA and death and despite that insisted on leaving AMA and signed AMA form.  Christel Mormon M.D on 09/05/2020 at 7:34 PM  [facility]   From 7 PM-7 AM, contact night-coverage [email or web address]  CC: Primary care physician; [name], MD

## 2020-09-05 NOTE — ED Provider Notes (Signed)
[facility] EMERGENCY DEPARTMENT Provider Note   CSN: [id number] Arrival date & time: 09/05/20  1713  An emergency department physician performed an initial assessment on this suspected stroke patient at 1713.  History No chief complaint on file.   Cindy Burns is a 74 y.o. female.  Patient is a 74 year old female with a history of diabetes which is poorly controlled, COPD, end-stage renal disease on dialysis, prior admissions for altered mental status due to metabolic encephalopathy who is presenting today as a code stroke.  Patient was normal earlier today and drove herself to dialysis.  While at dialysis she was noted to be normal when she arrived however when she was completing dialysis they noticed that she was no longer herself.  She seemed confused and was having difficulty speaking.  They called EMS immediately.  Normally patient is able to converse without difficulty.  Patient was last seen normal at 1400.  Blood sugar was checked and was stable.  Upon arrival patient denied any headache, shortness of breath or neck pain.  She denies any chest pain, shortness of breath or abdominal pain.  Patient is repeatedly saying the same things intermittently and will intermittently follow commands.  Stroke team is at bedside upon patient's arrival.  Patient denied any recent illness.  The history is provided by the patient, the EMS personnel and a relative. The history is limited by the condition of the patient.       Past Medical History:  Diagnosis Date  . AKI (acute kidney injury) ([place]) 01/2020  . Cancer [facility]) 2005   Breast Cancer  . COPD (chronic obstructive pulmonary disease) ([place])   . Depression   . Diabetes mellitus without complication ([place])   . Hyperlipidemia     Patient Active Problem List   Diagnosis Date Noted  . Bilateral leg pain 08/25/2020  . Folate deficiency 08/17/2020  . Acute metabolic encephalopathy 42/70/6237  . Closed traumatic minimally  displaced fracture of two ribs 08/15/2020  . Fever 04/30/2020  . ESRD (end stage renal disease) ([place]) 04/30/2020  . Hypocalcemia 01/19/2020  . Metabolic acidosis 62/83/1517  . Acute respiratory failure with hypoxia ([place]) 01/19/2020  . UTI (urinary tract infection) 01/19/2020  . Encounter for central line placement   . Pain and swelling of ankle, left   . Pressure injury of skin 01/09/2020  . Renal failure 01/08/2020  . Acute renal failure on dialysis ([place])   . Hepatomegaly 07/30/2019  . Right carotid bruit 07/30/2019  . SIADH (syndrome of inappropriate ADH production) ([place]) 04/06/2019  . B12 deficiency 04/06/2019  . Balance problem 03/18/2019  . Ventral hernia 04/03/2017  . Hypertension associated with diabetes ([place]) 02/28/2017  . Chronic insomnia 10/26/2013  . Diabetic autonomic neuropathy ([place]) 08/12/2013  . PULMONARY NODULE 09/27/2009  . ADENOCARCINOMA, BREAST 02/28/2009  . Normocytic anemia 06/24/2008  . Moderate COPD (chronic obstructive pulmonary disease) ([place]) 03/14/2008  . Major depressive disorder, recurrent episode, moderate ([place]) 08/11/2007  . Diabetes mellitus due to underlying condition, controlled, with neurologic complication ([place]) 61/60/7371  . Hyperlipidemia associated with type 2 diabetes mellitus ([place]) 04/30/2007  . CIGARETTE SMOKER 04/22/2007    Past Surgical History:  Procedure Laterality Date  . BREAST SURGERY  2005  . CATARACT EXTRACTION    . CHOLECYSTECTOMY  2013  . IR FLUORO GUIDE CV LINE RIGHT  02/01/2020  . IR US GUIDE VASC ACCESS RIGHT  02/01/2020     OB History   No obstetric history on file.  Family History  Problem Relation Age of Onset  . Arthritis Mother   . Alzheimer's disease Mother   . Dementia Father   . COPD Brother   . COPD Brother     Social History   Tobacco Use  . Smoking status: Former Smoker    Packs/day: 1.00    Years: 40.00    Pack years: 40.00    Types: Cigarettes  . Smokeless tobacco: Never Used  Vaping  Use  . Vaping Use: Never used  Substance Use Topics  . Alcohol use: No  . Drug use: No    Home Medications Prior to Admission medications   Medication Sig Start Date End Date Taking? Authorizing Provider  albuterol (VENTOLIN HFA) 108 (90 Base) MCG/ACT inhaler TAKE 2 PUFFS BY MOUTH EVERY 6 HOURS AS NEEDED FOR WHEEZE OR SHORTNESS OF BREATH 04/24/20   Bedsole, Amy E, MD  amLODipine (NORVASC) 10 MG tablet Take 1 tablet (10 mg total) by mouth daily. 02/20/20 04/29/29  Alma Friendly, MD  folic acid (FOLVITE) 1 MG tablet Take 1 tablet (1 mg total) by mouth daily. 08/17/20 08/17/21  Debbe Odea, MD  gabapentin (NEURONTIN) 300 MG capsule 300 mg after dialysis only on dialysis days 08/25/20   Bedsole, Amy E, MD  ipratropium-albuterol (DUONEB) 0.5-2.5 (3) MG/3ML SOLN Take 3 mLs by nebulization every 6 (six) hours as needed. 02/19/20 04/29/29  Alma Friendly, MD  metoprolol tartrate (LOPRESSOR) 25 MG tablet Take 1 tablet (25 mg total) by mouth 2 (two) times daily. 08/17/20 09/16/20  Debbe Odea, MD  rosuvastatin (CRESTOR) 20 MG tablet TAKE 1 TABLET BY MOUTH EVERY DAY 07/27/20   Bedsole, Amy E, MD  sitaGLIPtin (JANUVIA) 25 MG tablet Take 1 tablet (25 mg total) by mouth daily. 06/19/20   Bedsole, Amy E, MD  venlafaxine XR (EFFEXOR-XR) 37.5 MG 24 hr capsule TAKE 1 CAPSULE BY MOUTH DAILY WITH BREAKFAST. 03/20/20   Bedsole, Amy E, MD  vitamin B-12 (CYANOCOBALAMIN) 1000 MCG tablet Take 1 tablet (1,000 mcg total) by mouth daily. 08/17/20   Debbe Odea, MD    Allergies    Patient has no known allergies.  Review of Systems   Review of Systems  All other systems reviewed and are negative.   Physical Exam Updated Vital Signs BP (!) 164/74   Pulse 99   Temp 98.3 F (36.8 C) (Oral)   Resp 16   Wt 51.8 kg   SpO2 100%   BMI 21.02 kg/m   Physical Exam Vitals and nursing note reviewed.  Constitutional:      General: She is not in acute distress.    Appearance: She is well-developed and  normal weight.  HENT:     Head: Normocephalic and atraumatic.  Eyes:     Pupils: Pupils are equal, round, and reactive to light.  Cardiovascular:     Rate and Rhythm: Normal rate and regular rhythm.     Heart sounds: Normal heart sounds. No murmur heard.  No friction rub.  Pulmonary:     Effort: Pulmonary effort is normal.     Breath sounds: Normal breath sounds. No wheezing or rales.     Comments: Tunneling dialysis catheter present in the right upper chest without significant erythema or drainage Abdominal:     General: Bowel sounds are normal. There is no distension.     Palpations: Abdomen is soft.     Tenderness: There is no abdominal tenderness. There is no guarding or rebound.     Hernia: A  hernia is present.     Comments: Easily reducible umbilical hernia  Musculoskeletal:        General: No tenderness. Normal range of motion.     Right lower leg: No edema.     Left lower leg: No edema.     Comments: No edema  Skin:    General: Skin is warm and dry.     Findings: No rash.  Neurological:     Mental Status: She is alert.     Cranial Nerves: No cranial nerve deficit.     Comments: Patient is oriented to person and place.  When asked the year she says 2220.  She intermittently seems confused and reports she lives next to her parents.  She is able to name the president but at sometimes has mild aphasia saying certain words.  She sometimes repeatedly says the same thing over and over again.  She will intermittently follow commands.  There is no notable pronator drift.  Strength seems symmetric in all extremities.  No asterixis noted.  No notable facial droop.  Psychiatric:     Comments: Calm and cooperative     ED Results / Procedures / Treatments   Labs (all labs ordered are listed, but only abnormal results are displayed) Labs Reviewed  COMPREHENSIVE METABOLIC PANEL - Abnormal; Notable for the following components:      Result Value   Chloride 94 (*)    Glucose, Bld 123  (*)    Creatinine, Ser 3.32 (*)    Calcium 7.2 (*)    Albumin 3.2 (*)    GFR, Estimated 14 (*)    All other components within normal limits  AMMONIA - Abnormal; Notable for the following components:   Ammonia 40 (*)    All other components within normal limits  CBC WITH DIFFERENTIAL/PLATELET - Abnormal; Notable for the following components:   RBC 3.30 (*)    Hemoglobin 10.2 (*)    HCT 30.9 (*)    RDW 15.6 (*)    Lymphs Abs 0.6 (*)    All other components within normal limits  I-STAT CHEM 8, ED - Abnormal; Notable for the following components:   Chloride 95 (*)    BUN 25 (*)    Creatinine, Ser 3.40 (*)    Glucose, Bld 117 (*)    Calcium, Ion 0.77 (*)    Hemoglobin 10.2 (*)    HCT 30.0 (*)    All other components within normal limits  CBG MONITORING, ED - Abnormal; Notable for the following components:   Glucose-Capillary 119 (*)    All other components within normal limits  CBG MONITORING, ED - Abnormal; Notable for the following components:   Glucose-Capillary 157 (*)    All other components within normal limits  PROTIME-INR  APTT  PROTIME-INR  APTT  TROPONIN I (HIGH SENSITIVITY)    EKG EKG Interpretation  Date/Time:  Tuesday September 05 2020 17:56:48 EST Ventricular Rate:  97 PR Interval:    QRS Duration: 101 QT Interval:  402 QTC Calculation: 511 R Axis:   11 Text Interpretation: Sinus rhythm Repol abnrm suggests ischemia, lateral leads Prolonged QT interval new slight ST depression in V5, V6 Confirmed by Blanchie Dessert [phone]) on 09/05/2020 5:59:45 PM   Radiology No results found.  Procedures Procedures (including critical care time)  Medications Ordered in ED Medications  sodium chloride flush (NS) 0.9 % injection 3 mL (has no administration in time range)    ED Course  I have reviewed the triage  vital signs and the nursing notes.  Pertinent labs & imaging results that were available during my care of the patient were reviewed by me and  considered in my medical decision making (see chart for details).    MDM Rules/Calculators/A&P                          Elderly female with multiple medical problems presenting today as a code stroke due to confusion and mild aphasia.  Patient was last seen normal at 1400.  Patient's blood sugar within normal limits.  Upon arrival here patient is awake and alert but definitely having some word finding difficulty and some mild confusion.  No other focal findings present.  Possibility for stroke however also concern for possible metabolic encephalopathy or something related to electrolyte shifts from recently completing a course of dialysis.  Patient does not appear septic as she is afebrile and has reassuring vital signs.  She denies any cough, chest pain or shortness of breath.  She has no abdominal pain.  Dr. Malen Gauze evaluated the patient.  Head CT is negative for acute abnormality.  He did not feel that she was a candidate for TPA at this time as he suspects that her symptoms are more related to metabolic encephalopathy given her history and recent admission for altered mental status.  He requested following labs, observing the patient but if she still has symptoms in 1 hour she will need MRI and admission for observation.    7:00 PM EKG with mild ST depression in V5 and 6 that is slightly different from prior.  Patient again continues to deny any chest pain or shortness of breath.  The rest of patient's labs including blood sugar are unchanged.  On reevaluation patient's confusion is improving but still has some intermittent confusion.  Dr. Malen Gauze reevaluated the patient at this time do not feel that patient needs MRI but does need admission, observation to ensure that mental status clears back to baseline as she is otherwise independent and lives alone.  MDM Number of Diagnoses or Management Options   Amount and/or Complexity of Data Reviewed Clinical lab tests: ordered and reviewed Tests in the  radiology section of CPT: ordered and reviewed Tests in the medicine section of CPT: ordered and reviewed Decide to obtain previous medical records or to obtain history from someone other than the patient: yes Obtain history from someone other than the patient: yes Review and summarize past medical records: yes Discuss the patient with other providers: yes Independent visualization of images, tracings, or specimens: yes  Risk of Complications, Morbidity, and/or Mortality Presenting problems: high Diagnostic procedures: low Management options: low  Patient Progress Patient progress: improved    Final Clinical Impression(s) / ED Diagnoses Final diagnoses:  Altered mental status, unspecified altered mental status type    Rx / DC Orders ED Discharge Orders    None       Blanchie Dessert, MD 09/05/20 1901

## 2020-09-05 NOTE — ED Notes (Signed)
Pt still in MRI, vitals cannot be reassessed at this time.

## 2020-09-06 ENCOUNTER — Observation Stay (HOSPITAL_COMMUNITY): Payer: Medicare Other

## 2020-09-06 DIAGNOSIS — R410 Disorientation, unspecified: Secondary | ICD-10-CM

## 2020-09-06 DIAGNOSIS — R4182 Altered mental status, unspecified: Secondary | ICD-10-CM | POA: Diagnosis not present

## 2020-09-06 DIAGNOSIS — G928 Other toxic encephalopathy: Secondary | ICD-10-CM | POA: Diagnosis not present

## 2020-09-06 LAB — BASIC METABOLIC PANEL
Anion gap: 17 — ABNORMAL HIGH (ref 5–15)
BUN: 33 mg/dL — ABNORMAL HIGH (ref 8–23)
CO2: 26 mmol/L (ref 22–32)
Calcium: 7.6 mg/dL — ABNORMAL LOW (ref 8.9–10.3)
Chloride: 95 mmol/L — ABNORMAL LOW (ref 98–111)
Creatinine, Ser: 4.36 mg/dL — ABNORMAL HIGH (ref 0.44–1.00)
GFR, Estimated: 10 mL/min — ABNORMAL LOW (ref >60)
Glucose, Bld: 154 mg/dL — ABNORMAL HIGH (ref 70–99)
Potassium: 4.5 mmol/L (ref 3.5–5.1)
Sodium: 138 mmol/L (ref 135–145)

## 2020-09-06 LAB — URINALYSIS, ROUTINE W REFLEX MICROSCOPIC
Bilirubin Urine: NEGATIVE
Glucose, UA: NEGATIVE mg/dL
Ketones, ur: NEGATIVE mg/dL
Nitrite: NEGATIVE
Protein, ur: 100 mg/dL — AB
Specific Gravity, Urine: 1.004 — ABNORMAL LOW (ref 1.005–1.030)
WBC, UA: >50 WBC/hpf — ABNORMAL HIGH (ref 0–5)
pH: 7 (ref 5.0–8.0)

## 2020-09-06 LAB — CBG MONITORING, ED
Glucose-Capillary: 127 mg/dL — ABNORMAL HIGH (ref 70–99)
Glucose-Capillary: 56 mg/dL — ABNORMAL LOW (ref 70–99)
Glucose-Capillary: 57 mg/dL — ABNORMAL LOW (ref 70–99)
Glucose-Capillary: 93 mg/dL (ref 70–99)

## 2020-09-06 LAB — LIPID PANEL
Cholesterol: 131 mg/dL (ref 0–200)
HDL: 41 mg/dL (ref >40)
LDL Cholesterol: 65 mg/dL (ref 0–99)
Total CHOL/HDL Ratio: 3.2 RATIO
Triglycerides: 124 mg/dL (ref <150)
VLDL: 25 mg/dL (ref 0–40)

## 2020-09-06 LAB — CBC
HCT: 32.2 % — ABNORMAL LOW (ref 36.0–46.0)
Hemoglobin: 10.2 g/dL — ABNORMAL LOW (ref 12.0–15.0)
MCH: 30.8 pg (ref 26.0–34.0)
MCHC: 31.7 g/dL (ref 30.0–36.0)
MCV: 97.3 fL (ref 80.0–100.0)
Platelets: 170 10*3/uL (ref 150–400)
RBC: 3.31 MIL/uL — ABNORMAL LOW (ref 3.87–5.11)
RDW: 15.9 % — ABNORMAL HIGH (ref 11.5–15.5)
WBC: 4.3 10*3/uL (ref 4.0–10.5)
nRBC: 0 % (ref 0.0–0.2)

## 2020-09-06 LAB — HEMOGLOBIN A1C
Hgb A1c MFr Bld: 4.4 % — ABNORMAL LOW (ref 4.8–5.6)
Mean Plasma Glucose: 79.58 mg/dL

## 2020-09-06 LAB — PROTIME-INR
INR: 1.1 (ref 0.8–1.2)
Prothrombin Time: 14 seconds (ref 11.4–15.2)

## 2020-09-06 LAB — TROPONIN I (HIGH SENSITIVITY): Troponin I (High Sensitivity): 76 ng/L — ABNORMAL HIGH (ref <18)

## 2020-09-06 LAB — APTT: aPTT: 32 seconds (ref 24–36)

## 2020-09-06 MED ORDER — SODIUM CHLORIDE 0.9 % IV SOLN
1.0000 g | INTRAVENOUS | Status: DC
  Filled 2020-09-06: qty 10

## 2020-09-06 NOTE — ED Notes (Signed)
CBG 127 

## 2020-09-06 NOTE — Discharge Summary (Signed)
Physician Discharge Summary  Cindy Burns ZJQ:[id number] DOB: [birth year] DOA: 09/05/2020  PCP: [name], MD  Admit date: 09/05/2020 .  Discharge date: 09/06/2020  Admitted From:  Home.  Disposition:   Left AGAINST MEDICAL ADVICE.  Recommendations for Outpatient Follow-up:  1. Follow up with PCP in 1-2 weeks 2. Please obtain BMP/CBC in one week.  Home Health: None. Equipment/Devices: None  Discharge Condition: Fair CODE STATUS:Full code Diet recommendation: Heart Healthy   Brief/Interim Summary: This 74 years old female with past medical history significant for end-stage renal disease on hemodialysis, COPD, depression, history of breast cancer, noncompliant with hemodialysis and follow-ups admitted overnight for acute confusional state after hemodialysis session.  There was a concern about expressive aphasia.  Stroke code was called.  Neurology was consulted,  recommended MRI but patient was unwilling to do MRI.  She was found to have UTI and was started on ceftriaxone.  Neurology signed off.  RN reports she feels like her normal baseline self.  Patient left AGAINST MEDICAL ADVICE.  Patient left without being seen by provider.  She was managed for below problems   Discharge Diagnoses:  Active Problems:   Acute confusion    Acute encephalopathy/confusion with differential diagnosis including metabolic encephalopathy, less likely acute CVA. -The patient was admitted to a medical monitored bed. -Continue neuro checks every 4 hours with 24 hours. -Continue aspirin. -Neurology recommended MRI without contrast in a.m. for further assessment. -Patient refused MRI and attempted to leave, could not find a ride in the night. -UA consistent with UTI , started on ceftriaxone.  2.  Uncontrolled hypertension. -Permissive hypertension will be allowed pending rule out CVA. -Her antihypertensives will be continued with permissive parameters.  3.  Type 2 diabetes mellitus with  peripheral neuropathy. -She will be placed on supplement coverage with NovoLog and will continue her Januvia. -We will continue her Neurontin.  4.  End-stage renal disease on hemodialysis. -Nephrology consultation can be obtained as needed if she is still here till Thursday for hemodialysis.  5.  Dyslipidemia. -We will check fasting lipids and continue her statin therapy.  6.  Depression. -Continue Effexor XR.  7.  Vitamin B12 deficiency. -We will continue her vitamin B12.  8.  DVT prophylaxis. -Subcutaneous heparin.  Discharge Instructions Patient left AGAINST MEDICAL ADVICE    No Known Allergies  Consultations:  Neurology   Procedures/Studies: DG Chest 2 View  Result Date: 09/05/2020 CLINICAL DATA:  Altered level of consciousness EXAM: CHEST - 2 VIEW COMPARISON:  08/15/2020 FINDINGS: Frontal and lateral views of the chest demonstrates stable right internal jugular dialysis catheter. Cardiac silhouette is enlarged but stable. Chronic diffuse interstitial prominence without airspace disease, effusion, or pneumothorax. Mild anterior wedge compression deformities at T11 and T12 are noted, new since 02/15/2020. No other acute bony abnormalities. IMPRESSION: 1. Acute to subacute mild anterior wedge compression deformities of T11 and T12, new since 02/15/2020. 2. Otherwise no acute intrathoracic process. Electronically Signed   By: Randa Ngo M.D.   On: 09/05/2020 22:47   DG Pelvis 1-2 Views  Result Date: 08/16/2020 CLINICAL DATA:  74 year old female for MRI screening. EXAM: PELVIS - 1-2 VIEW; ABDOMEN - 1 VIEW COMPARISON:  Abdominal radiograph dated 01/08/2020. FINDINGS: No bowel dilatation or evidence of obstruction. There is moderate stool throughout the colon. No free air or radiopaque calculi. Right upper quadrant cholecystectomy clips. Degenerative changes of the spine and hips. There is slight flattening of the left femoral head, likely related to avascular necrosis.  IMPRESSION:  Cholecystectomy clips. No other radiopaque foreign object identified. Electronically Signed   By: Anner Crete M.D.   On: 08/16/2020 16:40   DG Abd 1 View  Result Date: 08/16/2020 CLINICAL DATA:  74 year old female for MRI screening. EXAM: PELVIS - 1-2 VIEW; ABDOMEN - 1 VIEW COMPARISON:  Abdominal radiograph dated 01/08/2020. FINDINGS: No bowel dilatation or evidence of obstruction. There is moderate stool throughout the colon. No free air or radiopaque calculi. Right upper quadrant cholecystectomy clips. Degenerative changes of the spine and hips. There is slight flattening of the left femoral head, likely related to avascular necrosis. IMPRESSION: Cholecystectomy clips. No other radiopaque foreign object identified. Electronically Signed   By: Anner Crete M.D.   On: 08/16/2020 16:40   CT Head Wo Contrast  Result Date: 08/15/2020 CLINICAL DATA:  Encephalopathy.  Found down. EXAM: CT HEAD WITHOUT CONTRAST CT CERVICAL SPINE WITHOUT CONTRAST TECHNIQUE: Multidetector CT imaging of the head and cervical spine was performed following the standard protocol without intravenous contrast. Multiplanar CT image reconstructions of the cervical spine were also generated. COMPARISON:  None. FINDINGS: CT HEAD FINDINGS Brain: There is no mass, hemorrhage or extra-axial collection. There is generalized atrophy without lobar predilection. There is hypoattenuation of the periventricular white matter, most commonly indicating chronic ischemic microangiopathy. Vascular: Atherosclerotic calcification of the internal carotid arteries at the skull base. No abnormal hyperdensity of the major intracranial arteries or dural venous sinuses. Skull: The visualized skull base, calvarium and extracranial soft tissues are normal. Sinuses/Orbits: No fluid levels or advanced mucosal thickening of the visualized paranasal sinuses. No mastoid or middle ear effusion. The orbits are normal. CT CERVICAL SPINE FINDINGS  Alignment: No static subluxation. Facets are aligned. Occipital condyles are normally positioned. Skull base and vertebrae: No acute fracture. Soft tissues and spinal canal: No prevertebral fluid or swelling. No visible canal hematoma. Disc levels: No advanced spinal canal or neural foraminal stenosis. Upper chest: No pneumothorax, pulmonary nodule or pleural effusion. Other: Cervical spine study is degraded by motion. IMPRESSION: 1. Chronic ischemic microangiopathy and generalized atrophy without acute intracranial abnormality. 2. Motion degraded cervical spine images without visible acute fracture or static subluxation. Electronically Signed   By: Ulyses Jarred M.D.   On: 08/15/2020 20:58   CT Cervical Spine Wo Contrast  Result Date: 08/15/2020 CLINICAL DATA:  Encephalopathy.  Found down. EXAM: CT HEAD WITHOUT CONTRAST CT CERVICAL SPINE WITHOUT CONTRAST TECHNIQUE: Multidetector CT imaging of the head and cervical spine was performed following the standard protocol without intravenous contrast. Multiplanar CT image reconstructions of the cervical spine were also generated. COMPARISON:  None. FINDINGS: CT HEAD FINDINGS Brain: There is no mass, hemorrhage or extra-axial collection. There is generalized atrophy without lobar predilection. There is hypoattenuation of the periventricular white matter, most commonly indicating chronic ischemic microangiopathy. Vascular: Atherosclerotic calcification of the internal carotid arteries at the skull base. No abnormal hyperdensity of the major intracranial arteries or dural venous sinuses. Skull: The visualized skull base, calvarium and extracranial soft tissues are normal. Sinuses/Orbits: No fluid levels or advanced mucosal thickening of the visualized paranasal sinuses. No mastoid or middle ear effusion. The orbits are normal. CT CERVICAL SPINE FINDINGS Alignment: No static subluxation. Facets are aligned. Occipital condyles are normally positioned. Skull base and  vertebrae: No acute fracture. Soft tissues and spinal canal: No prevertebral fluid or swelling. No visible canal hematoma. Disc levels: No advanced spinal canal or neural foraminal stenosis. Upper chest: No pneumothorax, pulmonary nodule or pleural effusion. Other: Cervical spine study is degraded by  motion. IMPRESSION: 1. Chronic ischemic microangiopathy and generalized atrophy without acute intracranial abnormality. 2. Motion degraded cervical spine images without visible acute fracture or static subluxation. Electronically Signed   By: Ulyses Jarred M.D.   On: 08/15/2020 20:58   MR BRAIN WO CONTRAST  Result Date: 08/16/2020 CLINICAL DATA:  Mental status change, unknown cause. Additional provided: Confusion, missed dialysis twice in the past week EXAM: MRI HEAD WITHOUT CONTRAST TECHNIQUE: Multiplanar, multiecho pulse sequences of the brain and surrounding structures were obtained without intravenous contrast. COMPARISON:  Head CT 08/15/2020. head CT 08/08/2014 FINDINGS: Brain: Mild generalized cerebral atrophy. Mild T2/FLAIR hyperintensity within the periventricular cerebral white matter is nonspecific, but compatible with chronic small vessel ischemic disease. There is no acute infarct. No evidence of intracranial mass. No chronic intracranial blood products. No extra-axial fluid collection. No midline shift. Partially empty sella turcica. Vascular: Expected proximal arterial flow voids. Skull and upper cervical spine: No focal marrow lesion. Cervical spondylosis Sinuses/Orbits: Visualized orbits show no acute finding. Frothy secretions within the left sphenoid sinus. Other: Trace fluid within right mastoid air cells. IMPRESSION: No evidence of acute intracranial abnormality, including acute infarction. Mild cerebral atrophy and chronic small vessel ischemic disease. Left sphenoid sinusitis. Trace right mastoid effusion. Electronically Signed   By: Kellie Simmering DO   On: 08/16/2020 17:29   DG Chest Port 1  View  Result Date: 08/15/2020 CLINICAL DATA:  Shortness of breath. EXAM: PORTABLE CHEST 1 VIEW COMPARISON:  April 29, 2020. FINDINGS: Stable cardiomediastinal silhouette. No pneumothorax or pleural effusion is noted. Right internal jugular dialysis catheter is unchanged in position. Both lungs are clear. Mildly displaced fractures are seen involving the lateral portions of the right eighth and ninth ribs. IMPRESSION: Mildly displaced right eighth and ninth rib fractures. No acute cardiopulmonary abnormality seen. Electronically Signed   By: Marijo Conception M.D.   On: 08/15/2020 19:18   DG Abd 2 Views  Result Date: 09/05/2020 CLINICAL DATA:  Altered level of consciousness EXAM: ABDOMEN - 2 VIEW COMPARISON:  08/16/2020 FINDINGS: Supine and upright frontal views of the abdomen and pelvis demonstrate an unremarkable bowel gas pattern. Moderate stool within the distal colon. No free gas within the greater peritoneal sac. No masses or abnormal calcifications. Chronic changes of femoral head avascular necrosis. IMPRESSION: 1. Unremarkable bowel gas pattern. Electronically Signed   By: Randa Ngo M.D.   On: 09/05/2020 22:55   CT HEAD CODE STROKE WO CONTRAST  Result Date: 09/05/2020 CLINICAL DATA:  Code stroke. Neuro deficit, acute, stroke suspected. Altered mental status, aphasia. EXAM: CT HEAD WITHOUT CONTRAST TECHNIQUE: Contiguous axial images were obtained from the base of the skull through the vertex without intravenous contrast. COMPARISON:  Brain MRI 08/16/2020. FINDINGS: Brain: Mildly motion degraded examination. Mild generalized cerebral atrophy. Mild periventricular ill-defined hypoattenuation is nonspecific, but compatible with chronic small vessel ischemic disease. Redemonstrated prominent perivascular space versus small chronic lacunar infarct within the left thalamus (series 5, image 14). There is no acute intracranial hemorrhage. No demarcated cortical infarct. No extra-axial fluid collection.  No evidence of intracranial mass. No midline shift. Vascular: No hyperdense vessel.  Atherosclerotic calcifications. Skull: Normal. Negative for fracture or focal lesion. Sinuses/Orbits: Visualized orbits show no acute finding. ASPECTS [facility] Stroke Program Early CT Score) - Ganglionic level infarction (caudate, lentiform nuclei, internal capsule, insula, M1-M3 cortex): 7 - Supraganglionic infarction (M4-M6 cortex): 3 Total score (0-10 with 10 being normal): 10 These results were communicated to Dr. Rory [name] At 5:46 pmon 11/30/2021by text page via  the [employer] system. IMPRESSION: Mildly motion degraded examination. No evidence of acute intracranial abnormality.  ASPECTS is 10. Mild cerebral atrophy and chronic small vessel ischemic disease. Redemonstrated prominent perivascular space versus chronic lacunar infarct within the left thalamus. Electronically Signed   By: Kellie Simmering DO   On: 09/05/2020 17:47        Subjective: Patient left AGAINST MEDICAL ADVICE without being seen by provider.  She was explained the risks and benefits of leaving [facility].  she understood and signed paper and left.   Discharge Exam: Vitals:   09/06/20 0830 09/06/20 0918  BP: (!) 176/62 (!) 143/73  Pulse: 78 80  Resp: 17 18  Temp:    SpO2: 99% 98%   Vitals:   09/06/20 0739 09/06/20 0745 09/06/20 0830 09/06/20 0918  BP: (!) 171/66 (!) 174/86 (!) 176/62 (!) 143/73  Pulse:  81 78 80  Resp:  20 17 18   Temp:      TempSrc:      SpO2:  100% 99% 98%  Weight:         The results of significant diagnostics from this hospitalization (including imaging, microbiology, ancillary and laboratory) are listed below for reference.     Microbiology: Recent Results (from the past 240 hour(s))  Resp Panel by RT-PCR (Flu A&B, Covid) Nasopharyngeal Swab     Status: None   Collection Time: 09/05/20  7:04 PM   Specimen: Nasopharyngeal Swab; Nasopharyngeal(NP) swabs in vial transport medium  Result Value  Ref Range Status   SARS Coronavirus 2 by RT PCR NEGATIVE NEGATIVE Final    Comment: (NOTE) SARS-CoV-2 target nucleic acids are NOT DETECTED.  The SARS-CoV-2 RNA is generally detectable in upper respiratory specimens during the acute phase of infection. The lowest concentration of SARS-CoV-2 viral copies this assay can detect is 138 copies/mL. A negative result does not preclude SARS-Cov-2 infection and should not be used as the sole basis for treatment or other patient management decisions. A negative result may occur with  improper specimen collection/handling, submission of specimen other than nasopharyngeal swab, presence of viral mutation(s) within the areas targeted by this assay, and inadequate number of viral copies(<138 copies/mL). A negative result must be combined with clinical observations, patient history, and epidemiological information. The expected result is Negative.  Fact Sheet for Patients:  EntrepreneurPulse.com.au  Fact Sheet for Healthcare Providers:  IncredibleEmployment.be  This test is no t yet approved or cleared by the [place] FDA and  has been authorized for detection and/or diagnosis of SARS-CoV-2 by FDA under an Emergency Use Authorization (EUA). This EUA will remain  in effect (meaning this test can be used) for the duration of the COVID-19 declaration under Section 564(b)(1) of the Act, 21 U.S.C.section 360bbb-3(b)(1), unless the authorization is terminated  or revoked sooner.       Influenza A by PCR NEGATIVE NEGATIVE Final   Influenza B by PCR NEGATIVE NEGATIVE Final    Comment: (NOTE) The Xpert Xpress SARS-CoV-2/FLU/RSV plus assay is intended as an aid in the diagnosis of influenza from Nasopharyngeal swab specimens and should not be used as a sole basis for treatment. Nasal washings and aspirates are unacceptable for Xpert Xpress SARS-CoV-2/FLU/RSV testing.  Fact Sheet for  Patients: EntrepreneurPulse.com.au  Fact Sheet for Healthcare Providers: IncredibleEmployment.be  This test is not yet approved or cleared by the [place] FDA and has been authorized for detection and/or diagnosis of SARS-CoV-2 by FDA under an Emergency Use Authorization (EUA). This EUA will remain  in effect (meaning this test can be used) for the duration of the COVID-19 declaration under Section 564(b)(1) of the Act, 21 U.S.C. section 360bbb-3(b)(1), unless the authorization is terminated or revoked.  Performed at [facility], [place] [address] [place], [place] [address]      Labs: BNP (last 3 results) Recent Labs    01/08/20 1412  BNP 81.8   Basic Metabolic Panel: Recent Labs  Lab 09/05/20 1717 09/05/20 1722 09/06/20 0318  NA 136 136 138  K 3.9 3.8 4.5  CL 94* 95* 95*  CO2 27  --  26  GLUCOSE 123* 117* 154*  BUN 23 25* 33*  CREATININE 3.32* 3.40* 4.36*  CALCIUM 7.2*  --  7.6*   Liver Function Tests: Recent Labs  Lab 09/05/20 1717  AST 20  ALT 12  ALKPHOS 63  BILITOT 0.7  PROT 6.6  ALBUMIN 3.2*   No results for input(s): LIPASE, AMYLASE in the last 168 hours. Recent Labs  Lab 09/05/20 1718  AMMONIA 40*   CBC: Recent Labs  Lab 09/05/20 1722 09/05/20 1749 09/06/20 0318  WBC  --  4.8 4.3  NEUTROABS  --  3.7  --   HGB 10.2* 10.2* 10.2*  HCT 30.0* 30.9* 32.2*  MCV  --  93.6 97.3  PLT  --  175 170   Cardiac Enzymes: No results for input(s): CKTOTAL, CKMB, CKMBINDEX, TROPONINI in the last 168 hours. BNP: Invalid input(s): POCBNP CBG: Recent Labs  Lab 09/05/20 1800 09/06/20 0152 09/06/20 0735 09/06/20 0753 09/06/20 0829  GLUCAP 157* 127* 57* 56* 93   D-Dimer No results for input(s): DDIMER in the last 72 hours. Hgb A1c Recent Labs    09/06/20 0318  HGBA1C 4.4*   Lipid Profile Recent Labs    09/06/20 0318  CHOL 131  HDL 41  LDLCALC 65  TRIG 124  CHOLHDL 3.2   Thyroid function  studies No results for input(s): TSH, T4TOTAL, T3FREE, THYROIDAB in the last 72 hours.  Invalid input(s): FREET3 Anemia work up No results for input(s): VITAMINB12, FOLATE, FERRITIN, TIBC, IRON, RETICCTPCT in the last 72 hours. Urinalysis    Component Value Date/Time   COLORURINE COLORLESS (A) 09/06/2020 0547   APPEARANCEUR TURBID (A) 09/06/2020 0547   LABSPEC 1.004 (L) 09/06/2020 0547   PHURINE 7.0 09/06/2020 0547   GLUCOSEU NEGATIVE 09/06/2020 0547   HGBUR SMALL (A) 09/06/2020 0547   HGBUR trace-intact 06/24/2008 0912   BILIRUBINUR NEGATIVE 09/06/2020 0547   BILIRUBINUR negative 09/23/2013 1559   KETONESUR NEGATIVE 09/06/2020 0547   PROTEINUR 100 (A) 09/06/2020 0547   UROBILINOGEN 0.2 09/23/2013 1559   UROBILINOGEN 0.2 06/24/2008 0912   NITRITE NEGATIVE 09/06/2020 0547   LEUKOCYTESUR MODERATE (A) 09/06/2020 0547   Sepsis Labs Invalid input(s): PROCALCITONIN,  WBC,  LACTICIDVEN Microbiology Recent Results (from the past 240 hour(s))  Resp Panel by RT-PCR (Flu A&B, Covid) Nasopharyngeal Swab     Status: None   Collection Time: 09/05/20  7:04 PM   Specimen: Nasopharyngeal Swab; Nasopharyngeal(NP) swabs in vial transport medium  Result Value Ref Range Status   SARS Coronavirus 2 by RT PCR NEGATIVE NEGATIVE Final    Comment: (NOTE) SARS-CoV-2 target nucleic acids are NOT DETECTED.  The SARS-CoV-2 RNA is generally detectable in upper respiratory specimens during the acute phase of infection. The lowest concentration of SARS-CoV-2 viral copies this assay can detect is 138 copies/mL. A negative result does not preclude SARS-Cov-2 infection and should not be used as the sole basis for treatment  or other patient management decisions. A negative result may occur with  improper specimen collection/handling, submission of specimen other than nasopharyngeal swab, presence of viral mutation(s) within the areas targeted by this assay, and inadequate number of viral copies(<138  copies/mL). A negative result must be combined with clinical observations, patient history, and epidemiological information. The expected result is Negative.  Fact Sheet for Patients:  EntrepreneurPulse.com.au  Fact Sheet for Healthcare Providers:  IncredibleEmployment.be  This test is no t yet approved or cleared by the [place] FDA and  has been authorized for detection and/or diagnosis of SARS-CoV-2 by FDA under an Emergency Use Authorization (EUA). This EUA will remain  in effect (meaning this test can be used) for the duration of the COVID-19 declaration under Section 564(b)(1) of the Act, 21 U.S.C.section 360bbb-3(b)(1), unless the authorization is terminated  or revoked sooner.       Influenza A by PCR NEGATIVE NEGATIVE Final   Influenza B by PCR NEGATIVE NEGATIVE Final    Comment: (NOTE) The Xpert Xpress SARS-CoV-2/FLU/RSV plus assay is intended as an aid in the diagnosis of influenza from Nasopharyngeal swab specimens and should not be used as a sole basis for treatment. Nasal washings and aspirates are unacceptable for Xpert Xpress SARS-CoV-2/FLU/RSV testing.  Fact Sheet for Patients: EntrepreneurPulse.com.au  Fact Sheet for Healthcare Providers: IncredibleEmployment.be  This test is not yet approved or cleared by the [place] FDA and has been authorized for detection and/or diagnosis of SARS-CoV-2 by FDA under an Emergency Use Authorization (EUA). This EUA will remain in effect (meaning this test can be used) for the duration of the COVID-19 declaration under Section 564(b)(1) of the Act, 21 U.S.C. section 360bbb-3(b)(1), unless the authorization is terminated or revoked.  Performed at [facility], [place] [address], [place], [place] [address]      Time coordinating discharge: Over 30 minutes  SIGNED:   Shawna Clamp, MD  [facility] 09/06/2020, 2:02 PM Pager    If 7PM-7AM, please contact night-coverage [email or web address]

## 2020-09-06 NOTE — ED Notes (Signed)
Pt has refused medications and vital signs.

## 2020-09-06 NOTE — ED Notes (Signed)
Pt is leaving AMA. AMA form signed. Pt verbalized understanding of risks up to and including death. Pt is A& O x 4.

## 2020-09-06 NOTE — ED Notes (Signed)
Pt ambulated to restroom with cane to attempt to provide a urine specimen.

## 2020-09-06 NOTE — ED Notes (Signed)
Notified Dr. Sidney Ace of pt's request to leave facility.

## 2020-09-06 NOTE — ED Notes (Signed)
Pt got dressed and disconnected herself from monitoring. Came out of the room and stated "My ride is here and I am leaving".  Staff educated on dangers of leaving and previously signed [facility] paperwork reviewed then placed in medical records.   Pt asked to wait for a provider to reassess her, pt unwilling to wait and leaves department  Pt is ambulatory with a cane out of the department  Pt does not appear in distress, respirations are even and non-labored  Skin is warm, dry and intact.  Provider notified

## 2020-09-06 NOTE — ED Notes (Signed)
This nurse came out of another patient's room and patient was in hallway with a bloody washcloth. Pt assisted back to room. She had removed her IV. Pt requesting to leave. Advised that this nurse would notify MD.

## 2020-09-06 NOTE — ED Notes (Signed)
Pt given a turkey sandwich and a cup of applesauce. 

## 2020-09-06 NOTE — Progress Notes (Signed)
PT Cancellation Note  Patient Details Name: Cindy Burns MRN: [id number] DOB: 09-Mar-1946   Cancelled Treatment:    Reason Eval/Treat Not Completed: Other (comment) Per RN, pt left AMA, so no further skilled PT needs.   Lou Miner, DPT  Acute Rehabilitation Services  Pager: [phone] Office: [phone]  Rudean Hitt 09/06/2020, 9:53 AM

## 2020-09-06 NOTE — ED Notes (Signed)
Pt spoke with Murvin Donning - Friend on the phone. Pt then came to nurse and advised that she will stay the night. Pt returned to room.

## 2020-09-06 NOTE — ED Notes (Addendum)
This nurse heard patient yelling, " Hello" from room. Checked on patient and she was out of bed standing at foot of stretcher. When asked about what she is doing, pt stated, " I am busy answering the door." Pt stated that she refused the MRI " because there is nothing wrong with my brain. I have lived 64 years without one and I will live another 72 years." Pt assisted back to bed.

## 2020-09-06 NOTE — Progress Notes (Signed)
Neurology Progress Note   S:// Seen and examined this morning. Attempted to leave AMA overnight but could not find a ride. Appears very uncomfortable being in the hospital Reports she feels like her normal baseline self.  O:// Current vital signs: BP (!) 174/86   Pulse 81   Temp 98.6 F (37 C) (Oral)   Resp 20   Wt 51.8 kg   SpO2 100%   BMI 21.02 kg/m  Vital signs in last 24 hours: Temp:  [98.3 F (36.8 C)-98.6 F (37 C)] 98.6 F (37 C) (12/01 0358) Pulse Rate:  [80-102] 81 (12/01 0745) Resp:  [14-26] 20 (12/01 0745) BP: (122-186)/(57-131) 174/86 (12/01 0745) SpO2:  [93 %-100 %] 100 % (12/01 0745) Weight:  [51.8 kg] 51.8 kg (11/30 1850) Neurological exam Awake alert oriented x3 Speech is nondysarthric No evidence of aphasia Still has some poor attention concentration Cranial nerves II to XII intact Motor exam with no drift in any of the 4 extremities.  Normal tone range of motion. Sensory exam intact to light touch Coordination intact  General: Well-developed well-nourished in no acute distress HEENT: Normocephalic atraumatic CVS: Regular rate rhythm Respiratory: Breathing well saturating normally on room air Extremities warm well perfused with no edema  Medications  Current Facility-Administered Medications:  .   stroke: mapping our early stages of recovery book, , Does not apply, Once, Mansy, Jan A, MD .  0.9 %  sodium chloride infusion, , Intravenous, Continuous, Mansy, Arvella Merles, MD, Stopped at 09/06/20 0032 .  acetaminophen (TYLENOL) tablet 650 mg, 650 mg, Oral, Q6H PRN **OR** acetaminophen (TYLENOL) suppository 650 mg, 650 mg, Rectal, Q6H PRN, Mansy, Jan A, MD .  amLODipine (NORVASC) tablet 10 mg, 10 mg, Oral, Daily, Mansy, Jan A, MD .  aspirin EC tablet 81 mg, 81 mg, Oral, Daily, Mansy, Jan A, MD .  folic acid (FOLVITE) tablet 1 mg, 1 mg, Oral, Daily, Mansy, Jan A, MD .  gabapentin (NEURONTIN) capsule 300 mg, 300 mg, Oral, Q T,Th,Sa-HD, Mansy, Jan A, MD .   heparin injection 5,000 Units, 5,000 Units, Subcutaneous, Q8H, Mansy, Arvella Merles, MD, 5,000 Units at 09/06/20 0734 .  insulin aspart (novoLOG) injection 0-9 Units, 0-9 Units, Subcutaneous, TID PC & HS, Mansy, Jan A, MD .  ipratropium-albuterol (DUONEB) 0.5-2.5 (3) MG/3ML nebulizer solution 3 mL, 3 mL, Nebulization, Q6H PRN, Mansy, Jan A, MD .  linagliptin (TRADJENTA) tablet 5 mg, 5 mg, Oral, Daily, Mansy, Jan A, MD .  magnesium hydroxide (MILK OF MAGNESIA) suspension 30 mL, 30 mL, Oral, Daily PRN, Mansy, Jan A, MD .  metoprolol tartrate (LOPRESSOR) tablet 25 mg, 25 mg, Oral, BID, Mansy, Jan A, MD, 25 mg at 09/06/20 0739 .  ondansetron (ZOFRAN) tablet 4 mg, 4 mg, Oral, Q6H PRN **OR** ondansetron (ZOFRAN) injection 4 mg, 4 mg, Intravenous, Q6H PRN, Mansy, Jan A, MD .  rosuvastatin (CRESTOR) tablet 20 mg, 20 mg, Oral, Daily, Mansy, Jan A, MD .  sodium chloride flush (NS) 0.9 % injection 3 mL, 3 mL, Intravenous, Once, Plunkett, Whitney, MD .  traZODone (DESYREL) tablet 25 mg, 25 mg, Oral, QHS PRN, Mansy, Jan A, MD .  venlafaxine XR (EFFEXOR-XR) 24 hr capsule 37.5 mg, 37.5 mg, Oral, Q breakfast, Mansy, Jan A, MD .  vitamin B-12 (CYANOCOBALAMIN) tablet 1,000 mcg, 1,000 mcg, Oral, Daily, Mansy, Jan A, MD  Current Outpatient Medications:  .  albuterol (VENTOLIN HFA) 108 (90 Base) MCG/ACT inhaler, TAKE 2 PUFFS BY MOUTH EVERY 6 HOURS AS NEEDED FOR WHEEZE OR  SHORTNESS OF BREATH (Patient taking differently: Inhale 2 puffs into the lungs every 6 (six) hours as needed for wheezing or shortness of breath. ), Disp: 8.5 g, Rfl: 2 .  amLODipine (NORVASC) 10 MG tablet, Take 1 tablet (10 mg total) by mouth daily. (Patient taking differently: Take 10 mg by mouth at bedtime. ), Disp: 30 tablet, Rfl: 0 .  diclofenac Sodium (VOLTAREN) 1 % GEL, Apply 2-4 g topically 4 (four) times daily as needed (to painful sites). , Disp: , Rfl:  .  gabapentin (NEURONTIN) 300 MG capsule, 300 mg after dialysis only on dialysis days (Patient  taking differently: Take 300 mg by mouth at bedtime. ), Disp: 36 capsule, Rfl: 3 .  ipratropium-albuterol (DUONEB) 0.5-2.5 (3) MG/3ML SOLN, Take 3 mLs by nebulization every 6 (six) hours as needed. (Patient taking differently: Take 3 mLs by nebulization every 6 (six) hours as needed (for shortness of breath or wheezing). ), Disp: 360 mL, Rfl: 0 .  metoprolol tartrate (LOPRESSOR) 25 MG tablet, Take 1 tablet (25 mg total) by mouth 2 (two) times daily. (Patient taking differently: Take 25 mg by mouth at bedtime. ), Disp: 60 tablet, Rfl: 0 .  rosuvastatin (CRESTOR) 20 MG tablet, TAKE 1 TABLET BY MOUTH EVERY DAY (Patient taking differently: Take 20 mg by mouth at bedtime. ), Disp: 90 tablet, Rfl: 3 .  sitaGLIPtin (JANUVIA) 25 MG tablet, Take 1 tablet (25 mg total) by mouth daily. (Patient taking differently: Take 25 mg by mouth at bedtime. ), Disp: 30 tablet, Rfl: 3 .  venlafaxine XR (EFFEXOR-XR) 37.5 MG 24 hr capsule, TAKE 1 CAPSULE BY MOUTH DAILY WITH BREAKFAST. (Patient taking differently: Take 37.5 mg by mouth at bedtime. ), Disp: 90 capsule, Rfl: 1 .  vitamin B-12 (CYANOCOBALAMIN) 1000 MCG tablet, Take 1 tablet (1,000 mcg total) by mouth daily., Disp: 30 tablet, Rfl: 0 .  folic acid (FOLVITE) 1 MG tablet, Take 1 tablet (1 mg total) by mouth daily. (Patient not taking: Reported on 09/05/2020), Disp: 30 tablet, Rfl: 3 Labs CBC    Component Value Date/Time   WBC 4.3 09/06/2020 0318   RBC 3.31 (L) 09/06/2020 0318   HGB 10.2 (L) 09/06/2020 0318   HCT 32.2 (L) 09/06/2020 0318   PLT 170 09/06/2020 0318   MCV 97.3 09/06/2020 0318   MCH 30.8 09/06/2020 0318   MCHC 31.7 09/06/2020 0318   RDW 15.9 (H) 09/06/2020 0318   LYMPHSABS 0.6 (L) 09/05/2020 1749   MONOABS 0.3 09/05/2020 1749   EOSABS 0.1 09/05/2020 1749   BASOSABS 0.0 09/05/2020 1749    CMP     Component Value Date/Time   NA 138 09/06/2020 0318   K 4.5 09/06/2020 0318   CL 95 (L) 09/06/2020 0318   CO2 26 09/06/2020 0318   GLUCOSE 154  (H) 09/06/2020 0318   BUN 33 (H) 09/06/2020 0318   CREATININE 4.36 (H) 09/06/2020 0318   CALCIUM 7.6 (L) 09/06/2020 0318   PROT 6.6 09/05/2020 1717   PROT 7.4 01/10/2012 1300   ALBUMIN 3.2 (L) 09/05/2020 1717   ALBUMIN 3.9 01/10/2012 1300   AST 20 09/05/2020 1717   AST 27 01/10/2012 1300   ALT 12 09/05/2020 1717   ALT 27 01/10/2012 1300   ALKPHOS 63 09/05/2020 1717   ALKPHOS 96 01/10/2012 1300   BILITOT 0.7 09/05/2020 1717   BILITOT 0.3 01/10/2012 1300   GFRNONAA 10 (L) 09/06/2020 0318   GFRAA 24 (L) 04/29/2020 1800    glycosylated hemoglobin  Lipid Panel  Component Value Date/Time   CHOL 131 09/06/2020 0318   TRIG 124 09/06/2020 0318   HDL 41 09/06/2020 0318   CHOLHDL 3.2 09/06/2020 0318   VLDL 25 09/06/2020 0318   LDLCALC 65 09/06/2020 0318   LDLCALC 49 06/16/2020 1621   LDLDIRECT 163.0 12/11/2018 0924  Urinalysis concerning for mild UTI.  Imaging I have reviewed images in epic and the results pertinent to this consultation are: CT head with no acute changes  Assessment: 74 year old woman past history of ESRD on HD, COPD, depression, history of breast cancer, noncompliance to HD, encephalopathy admission 08/15/2020, brought in for acute confusion after hemodialysis session with concerns for expressive aphasia and delay in answering questions. Exam none focal with poor attention concentration. Has documented history of waxing and waning encephalopathy which is being worked up outpatient. Concern for dialysis disequilibrium syndrome as the acute cause of presentation. Suspect there might be some underlying ongoing cognitive decline-difficult to make that assertion on inpatient evaluations. Appears much better in terms of her mentation this morning.  Recommendations: Unwilling for an MRI Correction of toxic metabolic derangements per primary team as you are Concern for UTI-will defer to primary team if needs treatment No further neurological recommendations at this  time Neurology will be available as needed Plan relayed to the primary hospitalist via secure chat  -- Amie Portland, MD [facility] Pager: [phone] If 7pm to 7am, please call on call as listed on AMION.

## 2020-09-07 DIAGNOSIS — Z992 Dependence on renal dialysis: Secondary | ICD-10-CM | POA: Diagnosis not present

## 2020-09-07 DIAGNOSIS — N390 Urinary tract infection, site not specified: Secondary | ICD-10-CM | POA: Diagnosis not present

## 2020-09-07 DIAGNOSIS — D509 Iron deficiency anemia, unspecified: Secondary | ICD-10-CM | POA: Diagnosis not present

## 2020-09-07 DIAGNOSIS — D688 Other specified coagulation defects: Secondary | ICD-10-CM | POA: Diagnosis not present

## 2020-09-07 DIAGNOSIS — N2581 Secondary hyperparathyroidism of renal origin: Secondary | ICD-10-CM | POA: Diagnosis not present

## 2020-09-07 DIAGNOSIS — D649 Anemia, unspecified: Secondary | ICD-10-CM | POA: Diagnosis not present

## 2020-09-07 DIAGNOSIS — T8249XA Other complication of vascular dialysis catheter, initial encounter: Secondary | ICD-10-CM | POA: Diagnosis not present

## 2020-09-07 DIAGNOSIS — Z23 Encounter for immunization: Secondary | ICD-10-CM | POA: Diagnosis not present

## 2020-09-07 DIAGNOSIS — E1129 Type 2 diabetes mellitus with other diabetic kidney complication: Secondary | ICD-10-CM | POA: Diagnosis not present

## 2020-09-07 DIAGNOSIS — D689 Coagulation defect, unspecified: Secondary | ICD-10-CM | POA: Diagnosis not present

## 2020-09-07 DIAGNOSIS — N186 End stage renal disease: Secondary | ICD-10-CM | POA: Diagnosis not present

## 2020-09-08 ENCOUNTER — Ambulatory Visit: Payer: Medicare Other | Admitting: Family Medicine

## 2020-09-08 NOTE — Progress Notes (Signed)
Called patient x 3 to follow up with her from her hospital visit on 09/05/2020.  (Left AMA) She does already have an appointment scheduled with Dr. Diona Browner on 09/15/2020 @ 3:00pm.

## 2020-09-09 DIAGNOSIS — N2581 Secondary hyperparathyroidism of renal origin: Secondary | ICD-10-CM | POA: Diagnosis not present

## 2020-09-09 DIAGNOSIS — D688 Other specified coagulation defects: Secondary | ICD-10-CM | POA: Diagnosis not present

## 2020-09-09 DIAGNOSIS — D649 Anemia, unspecified: Secondary | ICD-10-CM | POA: Diagnosis not present

## 2020-09-09 DIAGNOSIS — D509 Iron deficiency anemia, unspecified: Secondary | ICD-10-CM | POA: Diagnosis not present

## 2020-09-09 DIAGNOSIS — N186 End stage renal disease: Secondary | ICD-10-CM | POA: Diagnosis not present

## 2020-09-09 DIAGNOSIS — N390 Urinary tract infection, site not specified: Secondary | ICD-10-CM | POA: Diagnosis not present

## 2020-09-09 DIAGNOSIS — Z992 Dependence on renal dialysis: Secondary | ICD-10-CM | POA: Diagnosis not present

## 2020-09-09 DIAGNOSIS — T8249XA Other complication of vascular dialysis catheter, initial encounter: Secondary | ICD-10-CM | POA: Diagnosis not present

## 2020-09-09 DIAGNOSIS — E1129 Type 2 diabetes mellitus with other diabetic kidney complication: Secondary | ICD-10-CM | POA: Diagnosis not present

## 2020-09-09 DIAGNOSIS — D689 Coagulation defect, unspecified: Secondary | ICD-10-CM | POA: Diagnosis not present

## 2020-09-09 DIAGNOSIS — Z23 Encounter for immunization: Secondary | ICD-10-CM | POA: Diagnosis not present

## 2020-09-12 DIAGNOSIS — Z992 Dependence on renal dialysis: Secondary | ICD-10-CM | POA: Diagnosis not present

## 2020-09-12 DIAGNOSIS — D688 Other specified coagulation defects: Secondary | ICD-10-CM | POA: Diagnosis not present

## 2020-09-12 DIAGNOSIS — N186 End stage renal disease: Secondary | ICD-10-CM | POA: Diagnosis not present

## 2020-09-12 DIAGNOSIS — Z23 Encounter for immunization: Secondary | ICD-10-CM | POA: Diagnosis not present

## 2020-09-12 DIAGNOSIS — D649 Anemia, unspecified: Secondary | ICD-10-CM | POA: Diagnosis not present

## 2020-09-12 DIAGNOSIS — T8249XA Other complication of vascular dialysis catheter, initial encounter: Secondary | ICD-10-CM | POA: Diagnosis not present

## 2020-09-12 DIAGNOSIS — N2581 Secondary hyperparathyroidism of renal origin: Secondary | ICD-10-CM | POA: Diagnosis not present

## 2020-09-12 DIAGNOSIS — D509 Iron deficiency anemia, unspecified: Secondary | ICD-10-CM | POA: Diagnosis not present

## 2020-09-12 DIAGNOSIS — N390 Urinary tract infection, site not specified: Secondary | ICD-10-CM | POA: Diagnosis not present

## 2020-09-12 DIAGNOSIS — E1129 Type 2 diabetes mellitus with other diabetic kidney complication: Secondary | ICD-10-CM | POA: Diagnosis not present

## 2020-09-12 DIAGNOSIS — D689 Coagulation defect, unspecified: Secondary | ICD-10-CM | POA: Diagnosis not present

## 2020-09-14 DIAGNOSIS — D509 Iron deficiency anemia, unspecified: Secondary | ICD-10-CM | POA: Diagnosis not present

## 2020-09-14 DIAGNOSIS — Z23 Encounter for immunization: Secondary | ICD-10-CM | POA: Diagnosis not present

## 2020-09-14 DIAGNOSIS — D649 Anemia, unspecified: Secondary | ICD-10-CM | POA: Diagnosis not present

## 2020-09-14 DIAGNOSIS — N186 End stage renal disease: Secondary | ICD-10-CM | POA: Diagnosis not present

## 2020-09-14 DIAGNOSIS — D689 Coagulation defect, unspecified: Secondary | ICD-10-CM | POA: Diagnosis not present

## 2020-09-14 DIAGNOSIS — Z992 Dependence on renal dialysis: Secondary | ICD-10-CM | POA: Diagnosis not present

## 2020-09-14 DIAGNOSIS — N390 Urinary tract infection, site not specified: Secondary | ICD-10-CM | POA: Diagnosis not present

## 2020-09-14 DIAGNOSIS — T8249XA Other complication of vascular dialysis catheter, initial encounter: Secondary | ICD-10-CM | POA: Diagnosis not present

## 2020-09-14 DIAGNOSIS — N2581 Secondary hyperparathyroidism of renal origin: Secondary | ICD-10-CM | POA: Diagnosis not present

## 2020-09-14 DIAGNOSIS — E1129 Type 2 diabetes mellitus with other diabetic kidney complication: Secondary | ICD-10-CM | POA: Diagnosis not present

## 2020-09-14 DIAGNOSIS — D688 Other specified coagulation defects: Secondary | ICD-10-CM | POA: Diagnosis not present

## 2020-09-15 ENCOUNTER — Ambulatory Visit: Payer: Medicare Other | Admitting: Family Medicine

## 2020-09-16 DIAGNOSIS — D688 Other specified coagulation defects: Secondary | ICD-10-CM | POA: Diagnosis not present

## 2020-09-16 DIAGNOSIS — N2581 Secondary hyperparathyroidism of renal origin: Secondary | ICD-10-CM | POA: Diagnosis not present

## 2020-09-16 DIAGNOSIS — D649 Anemia, unspecified: Secondary | ICD-10-CM | POA: Diagnosis not present

## 2020-09-16 DIAGNOSIS — T8249XA Other complication of vascular dialysis catheter, initial encounter: Secondary | ICD-10-CM | POA: Diagnosis not present

## 2020-09-16 DIAGNOSIS — N186 End stage renal disease: Secondary | ICD-10-CM | POA: Diagnosis not present

## 2020-09-16 DIAGNOSIS — Z992 Dependence on renal dialysis: Secondary | ICD-10-CM | POA: Diagnosis not present

## 2020-09-16 DIAGNOSIS — E1129 Type 2 diabetes mellitus with other diabetic kidney complication: Secondary | ICD-10-CM | POA: Diagnosis not present

## 2020-09-16 DIAGNOSIS — D689 Coagulation defect, unspecified: Secondary | ICD-10-CM | POA: Diagnosis not present

## 2020-09-16 DIAGNOSIS — Z23 Encounter for immunization: Secondary | ICD-10-CM | POA: Diagnosis not present

## 2020-09-16 DIAGNOSIS — N390 Urinary tract infection, site not specified: Secondary | ICD-10-CM | POA: Diagnosis not present

## 2020-09-16 DIAGNOSIS — D509 Iron deficiency anemia, unspecified: Secondary | ICD-10-CM | POA: Diagnosis not present

## 2020-09-19 DIAGNOSIS — N186 End stage renal disease: Secondary | ICD-10-CM | POA: Diagnosis not present

## 2020-09-19 DIAGNOSIS — Z23 Encounter for immunization: Secondary | ICD-10-CM | POA: Diagnosis not present

## 2020-09-19 DIAGNOSIS — D688 Other specified coagulation defects: Secondary | ICD-10-CM | POA: Diagnosis not present

## 2020-09-19 DIAGNOSIS — N2581 Secondary hyperparathyroidism of renal origin: Secondary | ICD-10-CM | POA: Diagnosis not present

## 2020-09-19 DIAGNOSIS — N390 Urinary tract infection, site not specified: Secondary | ICD-10-CM | POA: Diagnosis not present

## 2020-09-19 DIAGNOSIS — D509 Iron deficiency anemia, unspecified: Secondary | ICD-10-CM | POA: Diagnosis not present

## 2020-09-19 DIAGNOSIS — E1129 Type 2 diabetes mellitus with other diabetic kidney complication: Secondary | ICD-10-CM | POA: Diagnosis not present

## 2020-09-19 DIAGNOSIS — D649 Anemia, unspecified: Secondary | ICD-10-CM | POA: Diagnosis not present

## 2020-09-19 DIAGNOSIS — D689 Coagulation defect, unspecified: Secondary | ICD-10-CM | POA: Diagnosis not present

## 2020-09-19 DIAGNOSIS — Z992 Dependence on renal dialysis: Secondary | ICD-10-CM | POA: Diagnosis not present

## 2020-09-19 DIAGNOSIS — T8249XA Other complication of vascular dialysis catheter, initial encounter: Secondary | ICD-10-CM | POA: Diagnosis not present

## 2020-09-20 DIAGNOSIS — J449 Chronic obstructive pulmonary disease, unspecified: Secondary | ICD-10-CM | POA: Diagnosis not present

## 2020-09-20 DIAGNOSIS — M25572 Pain in left ankle and joints of left foot: Secondary | ICD-10-CM | POA: Diagnosis not present

## 2020-09-20 DIAGNOSIS — M25472 Effusion, left ankle: Secondary | ICD-10-CM | POA: Diagnosis not present

## 2020-09-21 DIAGNOSIS — D649 Anemia, unspecified: Secondary | ICD-10-CM | POA: Diagnosis not present

## 2020-09-21 DIAGNOSIS — D689 Coagulation defect, unspecified: Secondary | ICD-10-CM | POA: Diagnosis not present

## 2020-09-21 DIAGNOSIS — N390 Urinary tract infection, site not specified: Secondary | ICD-10-CM | POA: Diagnosis not present

## 2020-09-21 DIAGNOSIS — E1129 Type 2 diabetes mellitus with other diabetic kidney complication: Secondary | ICD-10-CM | POA: Diagnosis not present

## 2020-09-21 DIAGNOSIS — T8249XA Other complication of vascular dialysis catheter, initial encounter: Secondary | ICD-10-CM | POA: Diagnosis not present

## 2020-09-21 DIAGNOSIS — N2581 Secondary hyperparathyroidism of renal origin: Secondary | ICD-10-CM | POA: Diagnosis not present

## 2020-09-21 DIAGNOSIS — N186 End stage renal disease: Secondary | ICD-10-CM | POA: Diagnosis not present

## 2020-09-21 DIAGNOSIS — Z23 Encounter for immunization: Secondary | ICD-10-CM | POA: Diagnosis not present

## 2020-09-21 DIAGNOSIS — Z992 Dependence on renal dialysis: Secondary | ICD-10-CM | POA: Diagnosis not present

## 2020-09-21 DIAGNOSIS — D509 Iron deficiency anemia, unspecified: Secondary | ICD-10-CM | POA: Diagnosis not present

## 2020-09-21 DIAGNOSIS — D688 Other specified coagulation defects: Secondary | ICD-10-CM | POA: Diagnosis not present

## 2020-09-23 DIAGNOSIS — D509 Iron deficiency anemia, unspecified: Secondary | ICD-10-CM | POA: Diagnosis not present

## 2020-09-23 DIAGNOSIS — T8249XA Other complication of vascular dialysis catheter, initial encounter: Secondary | ICD-10-CM | POA: Diagnosis not present

## 2020-09-23 DIAGNOSIS — N2581 Secondary hyperparathyroidism of renal origin: Secondary | ICD-10-CM | POA: Diagnosis not present

## 2020-09-23 DIAGNOSIS — D649 Anemia, unspecified: Secondary | ICD-10-CM | POA: Diagnosis not present

## 2020-09-23 DIAGNOSIS — D688 Other specified coagulation defects: Secondary | ICD-10-CM | POA: Diagnosis not present

## 2020-09-23 DIAGNOSIS — N390 Urinary tract infection, site not specified: Secondary | ICD-10-CM | POA: Diagnosis not present

## 2020-09-23 DIAGNOSIS — Z23 Encounter for immunization: Secondary | ICD-10-CM | POA: Diagnosis not present

## 2020-09-23 DIAGNOSIS — E1129 Type 2 diabetes mellitus with other diabetic kidney complication: Secondary | ICD-10-CM | POA: Diagnosis not present

## 2020-09-23 DIAGNOSIS — Z992 Dependence on renal dialysis: Secondary | ICD-10-CM | POA: Diagnosis not present

## 2020-09-23 DIAGNOSIS — N186 End stage renal disease: Secondary | ICD-10-CM | POA: Diagnosis not present

## 2020-09-23 DIAGNOSIS — D689 Coagulation defect, unspecified: Secondary | ICD-10-CM | POA: Diagnosis not present

## 2020-09-26 ENCOUNTER — Other Ambulatory Visit: Payer: Self-pay

## 2020-09-26 DIAGNOSIS — T8249XA Other complication of vascular dialysis catheter, initial encounter: Secondary | ICD-10-CM | POA: Diagnosis not present

## 2020-09-26 DIAGNOSIS — D689 Coagulation defect, unspecified: Secondary | ICD-10-CM | POA: Diagnosis not present

## 2020-09-26 DIAGNOSIS — E1129 Type 2 diabetes mellitus with other diabetic kidney complication: Secondary | ICD-10-CM | POA: Diagnosis not present

## 2020-09-26 DIAGNOSIS — Z23 Encounter for immunization: Secondary | ICD-10-CM | POA: Diagnosis not present

## 2020-09-26 DIAGNOSIS — D649 Anemia, unspecified: Secondary | ICD-10-CM | POA: Diagnosis not present

## 2020-09-26 DIAGNOSIS — D509 Iron deficiency anemia, unspecified: Secondary | ICD-10-CM | POA: Diagnosis not present

## 2020-09-26 DIAGNOSIS — Z992 Dependence on renal dialysis: Secondary | ICD-10-CM | POA: Diagnosis not present

## 2020-09-26 DIAGNOSIS — N186 End stage renal disease: Secondary | ICD-10-CM | POA: Diagnosis not present

## 2020-09-26 DIAGNOSIS — D688 Other specified coagulation defects: Secondary | ICD-10-CM | POA: Diagnosis not present

## 2020-09-26 DIAGNOSIS — N2581 Secondary hyperparathyroidism of renal origin: Secondary | ICD-10-CM | POA: Diagnosis not present

## 2020-09-26 DIAGNOSIS — N390 Urinary tract infection, site not specified: Secondary | ICD-10-CM | POA: Diagnosis not present

## 2020-09-26 NOTE — Patient Outreach (Signed)
[facility]) Care Management  09/26/2020  Cindy Burns [birth year] [id number]   Case closure:  4th attempt to reach patient was unsuccessful.  PLAN: close case a unable to contact.  Tomasa Rand, RN, BSN, CEN [facility] [employer] [phone]

## 2020-09-27 ENCOUNTER — Other Ambulatory Visit: Payer: Self-pay | Admitting: Family Medicine

## 2020-09-28 DIAGNOSIS — N186 End stage renal disease: Secondary | ICD-10-CM | POA: Diagnosis not present

## 2020-09-28 DIAGNOSIS — D689 Coagulation defect, unspecified: Secondary | ICD-10-CM | POA: Diagnosis not present

## 2020-09-28 DIAGNOSIS — N390 Urinary tract infection, site not specified: Secondary | ICD-10-CM | POA: Diagnosis not present

## 2020-09-28 DIAGNOSIS — E1129 Type 2 diabetes mellitus with other diabetic kidney complication: Secondary | ICD-10-CM | POA: Diagnosis not present

## 2020-09-28 DIAGNOSIS — D688 Other specified coagulation defects: Secondary | ICD-10-CM | POA: Diagnosis not present

## 2020-09-28 DIAGNOSIS — T8249XA Other complication of vascular dialysis catheter, initial encounter: Secondary | ICD-10-CM | POA: Diagnosis not present

## 2020-09-28 DIAGNOSIS — D649 Anemia, unspecified: Secondary | ICD-10-CM | POA: Diagnosis not present

## 2020-09-28 DIAGNOSIS — D509 Iron deficiency anemia, unspecified: Secondary | ICD-10-CM | POA: Diagnosis not present

## 2020-09-28 DIAGNOSIS — Z992 Dependence on renal dialysis: Secondary | ICD-10-CM | POA: Diagnosis not present

## 2020-09-28 DIAGNOSIS — N2581 Secondary hyperparathyroidism of renal origin: Secondary | ICD-10-CM | POA: Diagnosis not present

## 2020-09-28 DIAGNOSIS — Z23 Encounter for immunization: Secondary | ICD-10-CM | POA: Diagnosis not present

## 2020-10-01 DIAGNOSIS — N2581 Secondary hyperparathyroidism of renal origin: Secondary | ICD-10-CM | POA: Diagnosis not present

## 2020-10-01 DIAGNOSIS — D649 Anemia, unspecified: Secondary | ICD-10-CM | POA: Diagnosis not present

## 2020-10-01 DIAGNOSIS — D689 Coagulation defect, unspecified: Secondary | ICD-10-CM | POA: Diagnosis not present

## 2020-10-01 DIAGNOSIS — Z23 Encounter for immunization: Secondary | ICD-10-CM | POA: Diagnosis not present

## 2020-10-01 DIAGNOSIS — D688 Other specified coagulation defects: Secondary | ICD-10-CM | POA: Diagnosis not present

## 2020-10-01 DIAGNOSIS — N186 End stage renal disease: Secondary | ICD-10-CM | POA: Diagnosis not present

## 2020-10-01 DIAGNOSIS — D509 Iron deficiency anemia, unspecified: Secondary | ICD-10-CM | POA: Diagnosis not present

## 2020-10-01 DIAGNOSIS — Z992 Dependence on renal dialysis: Secondary | ICD-10-CM | POA: Diagnosis not present

## 2020-10-01 DIAGNOSIS — E1129 Type 2 diabetes mellitus with other diabetic kidney complication: Secondary | ICD-10-CM | POA: Diagnosis not present

## 2020-10-01 DIAGNOSIS — T8249XA Other complication of vascular dialysis catheter, initial encounter: Secondary | ICD-10-CM | POA: Diagnosis not present

## 2020-10-01 DIAGNOSIS — N390 Urinary tract infection, site not specified: Secondary | ICD-10-CM | POA: Diagnosis not present

## 2020-10-03 DIAGNOSIS — D689 Coagulation defect, unspecified: Secondary | ICD-10-CM | POA: Diagnosis not present

## 2020-10-03 DIAGNOSIS — N2581 Secondary hyperparathyroidism of renal origin: Secondary | ICD-10-CM | POA: Diagnosis not present

## 2020-10-03 DIAGNOSIS — T8249XA Other complication of vascular dialysis catheter, initial encounter: Secondary | ICD-10-CM | POA: Diagnosis not present

## 2020-10-03 DIAGNOSIS — N390 Urinary tract infection, site not specified: Secondary | ICD-10-CM | POA: Diagnosis not present

## 2020-10-03 DIAGNOSIS — D649 Anemia, unspecified: Secondary | ICD-10-CM | POA: Diagnosis not present

## 2020-10-03 DIAGNOSIS — D688 Other specified coagulation defects: Secondary | ICD-10-CM | POA: Diagnosis not present

## 2020-10-03 DIAGNOSIS — E1129 Type 2 diabetes mellitus with other diabetic kidney complication: Secondary | ICD-10-CM | POA: Diagnosis not present

## 2020-10-03 DIAGNOSIS — Z23 Encounter for immunization: Secondary | ICD-10-CM | POA: Diagnosis not present

## 2020-10-03 DIAGNOSIS — D509 Iron deficiency anemia, unspecified: Secondary | ICD-10-CM | POA: Diagnosis not present

## 2020-10-03 DIAGNOSIS — Z992 Dependence on renal dialysis: Secondary | ICD-10-CM | POA: Diagnosis not present

## 2020-10-03 DIAGNOSIS — N186 End stage renal disease: Secondary | ICD-10-CM | POA: Diagnosis not present

## 2020-10-05 DIAGNOSIS — D509 Iron deficiency anemia, unspecified: Secondary | ICD-10-CM | POA: Diagnosis not present

## 2020-10-05 DIAGNOSIS — Z23 Encounter for immunization: Secondary | ICD-10-CM | POA: Diagnosis not present

## 2020-10-05 DIAGNOSIS — N2581 Secondary hyperparathyroidism of renal origin: Secondary | ICD-10-CM | POA: Diagnosis not present

## 2020-10-05 DIAGNOSIS — T8249XA Other complication of vascular dialysis catheter, initial encounter: Secondary | ICD-10-CM | POA: Diagnosis not present

## 2020-10-05 DIAGNOSIS — D689 Coagulation defect, unspecified: Secondary | ICD-10-CM | POA: Diagnosis not present

## 2020-10-05 DIAGNOSIS — N390 Urinary tract infection, site not specified: Secondary | ICD-10-CM | POA: Diagnosis not present

## 2020-10-05 DIAGNOSIS — D649 Anemia, unspecified: Secondary | ICD-10-CM | POA: Diagnosis not present

## 2020-10-05 DIAGNOSIS — D688 Other specified coagulation defects: Secondary | ICD-10-CM | POA: Diagnosis not present

## 2020-10-05 DIAGNOSIS — E1129 Type 2 diabetes mellitus with other diabetic kidney complication: Secondary | ICD-10-CM | POA: Diagnosis not present

## 2020-10-05 DIAGNOSIS — N186 End stage renal disease: Secondary | ICD-10-CM | POA: Diagnosis not present

## 2020-10-05 DIAGNOSIS — Z992 Dependence on renal dialysis: Secondary | ICD-10-CM | POA: Diagnosis not present

## 2020-10-06 DIAGNOSIS — Z992 Dependence on renal dialysis: Secondary | ICD-10-CM | POA: Diagnosis not present

## 2020-10-06 DIAGNOSIS — N186 End stage renal disease: Secondary | ICD-10-CM | POA: Diagnosis not present

## 2020-10-06 DIAGNOSIS — N179 Acute kidney failure, unspecified: Secondary | ICD-10-CM | POA: Diagnosis not present

## 2020-10-08 DIAGNOSIS — D688 Other specified coagulation defects: Secondary | ICD-10-CM | POA: Diagnosis not present

## 2020-10-08 DIAGNOSIS — D509 Iron deficiency anemia, unspecified: Secondary | ICD-10-CM | POA: Diagnosis not present

## 2020-10-08 DIAGNOSIS — N2581 Secondary hyperparathyroidism of renal origin: Secondary | ICD-10-CM | POA: Diagnosis not present

## 2020-10-08 DIAGNOSIS — Z992 Dependence on renal dialysis: Secondary | ICD-10-CM | POA: Diagnosis not present

## 2020-10-08 DIAGNOSIS — N186 End stage renal disease: Secondary | ICD-10-CM | POA: Diagnosis not present

## 2020-10-08 DIAGNOSIS — T8249XA Other complication of vascular dialysis catheter, initial encounter: Secondary | ICD-10-CM | POA: Diagnosis not present

## 2020-10-08 DIAGNOSIS — D689 Coagulation defect, unspecified: Secondary | ICD-10-CM | POA: Diagnosis not present

## 2020-10-10 DIAGNOSIS — N186 End stage renal disease: Secondary | ICD-10-CM | POA: Diagnosis not present

## 2020-10-10 DIAGNOSIS — D509 Iron deficiency anemia, unspecified: Secondary | ICD-10-CM | POA: Diagnosis not present

## 2020-10-10 DIAGNOSIS — D688 Other specified coagulation defects: Secondary | ICD-10-CM | POA: Diagnosis not present

## 2020-10-10 DIAGNOSIS — T8249XA Other complication of vascular dialysis catheter, initial encounter: Secondary | ICD-10-CM | POA: Diagnosis not present

## 2020-10-10 DIAGNOSIS — Z992 Dependence on renal dialysis: Secondary | ICD-10-CM | POA: Diagnosis not present

## 2020-10-10 DIAGNOSIS — N2581 Secondary hyperparathyroidism of renal origin: Secondary | ICD-10-CM | POA: Diagnosis not present

## 2020-10-10 DIAGNOSIS — D689 Coagulation defect, unspecified: Secondary | ICD-10-CM | POA: Diagnosis not present

## 2020-10-11 ENCOUNTER — Ambulatory Visit: Payer: Medicare Other | Admitting: Podiatry

## 2020-10-12 DIAGNOSIS — N2581 Secondary hyperparathyroidism of renal origin: Secondary | ICD-10-CM | POA: Diagnosis not present

## 2020-10-12 DIAGNOSIS — D689 Coagulation defect, unspecified: Secondary | ICD-10-CM | POA: Diagnosis not present

## 2020-10-12 DIAGNOSIS — D688 Other specified coagulation defects: Secondary | ICD-10-CM | POA: Diagnosis not present

## 2020-10-12 DIAGNOSIS — N186 End stage renal disease: Secondary | ICD-10-CM | POA: Diagnosis not present

## 2020-10-12 DIAGNOSIS — Z992 Dependence on renal dialysis: Secondary | ICD-10-CM | POA: Diagnosis not present

## 2020-10-12 DIAGNOSIS — D509 Iron deficiency anemia, unspecified: Secondary | ICD-10-CM | POA: Diagnosis not present

## 2020-10-12 DIAGNOSIS — T8249XA Other complication of vascular dialysis catheter, initial encounter: Secondary | ICD-10-CM | POA: Diagnosis not present

## 2020-10-14 DIAGNOSIS — Z992 Dependence on renal dialysis: Secondary | ICD-10-CM | POA: Diagnosis not present

## 2020-10-14 DIAGNOSIS — T8249XA Other complication of vascular dialysis catheter, initial encounter: Secondary | ICD-10-CM | POA: Diagnosis not present

## 2020-10-14 DIAGNOSIS — D509 Iron deficiency anemia, unspecified: Secondary | ICD-10-CM | POA: Diagnosis not present

## 2020-10-14 DIAGNOSIS — D688 Other specified coagulation defects: Secondary | ICD-10-CM | POA: Diagnosis not present

## 2020-10-14 DIAGNOSIS — N186 End stage renal disease: Secondary | ICD-10-CM | POA: Diagnosis not present

## 2020-10-14 DIAGNOSIS — N2581 Secondary hyperparathyroidism of renal origin: Secondary | ICD-10-CM | POA: Diagnosis not present

## 2020-10-14 DIAGNOSIS — D689 Coagulation defect, unspecified: Secondary | ICD-10-CM | POA: Diagnosis not present

## 2020-10-20 ENCOUNTER — Ambulatory Visit: Payer: Medicare Other | Admitting: Family Medicine

## 2020-10-21 DIAGNOSIS — J449 Chronic obstructive pulmonary disease, unspecified: Secondary | ICD-10-CM | POA: Diagnosis not present

## 2020-10-21 DIAGNOSIS — M25472 Effusion, left ankle: Secondary | ICD-10-CM | POA: Diagnosis not present

## 2020-10-21 DIAGNOSIS — M25572 Pain in left ankle and joints of left foot: Secondary | ICD-10-CM | POA: Diagnosis not present

## 2020-10-22 ENCOUNTER — Other Ambulatory Visit: Payer: Self-pay

## 2020-10-22 ENCOUNTER — Emergency Department (HOSPITAL_COMMUNITY): Payer: Medicare Other

## 2020-10-22 ENCOUNTER — Encounter (HOSPITAL_COMMUNITY): Payer: Self-pay | Admitting: Emergency Medicine

## 2020-10-22 ENCOUNTER — Inpatient Hospital Stay (HOSPITAL_COMMUNITY)
Admission: EM | Admit: 2020-10-22 | Discharge: 2020-10-25 | DRG: 640 | Disposition: A | Discharge status: Home Health | Payer: Medicare Other | Attending: Internal Medicine | Admitting: Internal Medicine

## 2020-10-22 DIAGNOSIS — E872 Acidosis, unspecified: Secondary | ICD-10-CM | POA: Diagnosis present

## 2020-10-22 DIAGNOSIS — Z820 Family history of epilepsy and other diseases of the nervous system: Secondary | ICD-10-CM | POA: Diagnosis not present

## 2020-10-22 DIAGNOSIS — Z9849 Cataract extraction status, unspecified eye: Secondary | ICD-10-CM | POA: Diagnosis not present

## 2020-10-22 DIAGNOSIS — Z8261 Family history of arthritis: Secondary | ICD-10-CM | POA: Diagnosis not present

## 2020-10-22 DIAGNOSIS — Z825 Family history of asthma and other chronic lower respiratory diseases: Secondary | ICD-10-CM | POA: Diagnosis not present

## 2020-10-22 DIAGNOSIS — R278 Other lack of coordination: Secondary | ICD-10-CM | POA: Diagnosis present

## 2020-10-22 DIAGNOSIS — E875 Hyperkalemia: Principal | ICD-10-CM

## 2020-10-22 DIAGNOSIS — Z992 Dependence on renal dialysis: Secondary | ICD-10-CM

## 2020-10-22 DIAGNOSIS — F32A Depression, unspecified: Secondary | ICD-10-CM | POA: Diagnosis not present

## 2020-10-22 DIAGNOSIS — R4781 Slurred speech: Secondary | ICD-10-CM | POA: Diagnosis not present

## 2020-10-22 DIAGNOSIS — Z79899 Other long term (current) drug therapy: Secondary | ICD-10-CM | POA: Diagnosis not present

## 2020-10-22 DIAGNOSIS — Z853 Personal history of malignant neoplasm of breast: Secondary | ICD-10-CM

## 2020-10-22 DIAGNOSIS — J323 Chronic sphenoidal sinusitis: Secondary | ICD-10-CM | POA: Diagnosis not present

## 2020-10-22 DIAGNOSIS — N186 End stage renal disease: Secondary | ICD-10-CM | POA: Diagnosis present

## 2020-10-22 DIAGNOSIS — E1122 Type 2 diabetes mellitus with diabetic chronic kidney disease: Secondary | ICD-10-CM | POA: Diagnosis not present

## 2020-10-22 DIAGNOSIS — J449 Chronic obstructive pulmonary disease, unspecified: Secondary | ICD-10-CM | POA: Diagnosis not present

## 2020-10-22 DIAGNOSIS — Z9115 Patient's noncompliance with renal dialysis: Secondary | ICD-10-CM

## 2020-10-22 DIAGNOSIS — J0130 Acute sphenoidal sinusitis, unspecified: Secondary | ICD-10-CM | POA: Diagnosis not present

## 2020-10-22 DIAGNOSIS — E785 Hyperlipidemia, unspecified: Secondary | ICD-10-CM | POA: Diagnosis present

## 2020-10-22 DIAGNOSIS — G9341 Metabolic encephalopathy: Secondary | ICD-10-CM | POA: Diagnosis not present

## 2020-10-22 DIAGNOSIS — Z20822 Contact with and (suspected) exposure to covid-19: Secondary | ICD-10-CM | POA: Diagnosis present

## 2020-10-22 DIAGNOSIS — E118 Type 2 diabetes mellitus with unspecified complications: Secondary | ICD-10-CM | POA: Diagnosis not present

## 2020-10-22 DIAGNOSIS — D649 Anemia, unspecified: Secondary | ICD-10-CM | POA: Diagnosis not present

## 2020-10-22 DIAGNOSIS — Z87891 Personal history of nicotine dependence: Secondary | ICD-10-CM

## 2020-10-22 DIAGNOSIS — R6889 Other general symptoms and signs: Secondary | ICD-10-CM | POA: Diagnosis not present

## 2020-10-22 DIAGNOSIS — A419 Sepsis, unspecified organism: Secondary | ICD-10-CM | POA: Diagnosis not present

## 2020-10-22 DIAGNOSIS — G9389 Other specified disorders of brain: Secondary | ICD-10-CM | POA: Diagnosis not present

## 2020-10-22 DIAGNOSIS — Z743 Need for continuous supervision: Secondary | ICD-10-CM | POA: Diagnosis not present

## 2020-10-22 DIAGNOSIS — D631 Anemia in chronic kidney disease: Secondary | ICD-10-CM | POA: Diagnosis not present

## 2020-10-22 DIAGNOSIS — E1121 Type 2 diabetes mellitus with diabetic nephropathy: Secondary | ICD-10-CM

## 2020-10-22 DIAGNOSIS — I120 Hypertensive chronic kidney disease with stage 5 chronic kidney disease or end stage renal disease: Secondary | ICD-10-CM | POA: Diagnosis not present

## 2020-10-22 DIAGNOSIS — N250 Renal osteodystrophy: Secondary | ICD-10-CM | POA: Diagnosis not present

## 2020-10-22 DIAGNOSIS — R000 Tachycardia, unspecified: Secondary | ICD-10-CM | POA: Diagnosis not present

## 2020-10-22 LAB — LACTIC ACID, PLASMA
Lactic Acid, Venous: 0.8 mmol/L (ref 0.5–1.9)
Lactic Acid, Venous: 1.3 mmol/L (ref 0.5–1.9)

## 2020-10-22 LAB — I-STAT VENOUS BLOOD GAS, ED
Acid-base deficit: 9 mmol/L — ABNORMAL HIGH (ref 0.0–2.0)
Bicarbonate: 14.4 mmol/L — ABNORMAL LOW (ref 20.0–28.0)
Calcium, Ion: 0.87 mmol/L — CL (ref 1.15–1.40)
HCT: 25 % — ABNORMAL LOW (ref 36.0–46.0)
Hemoglobin: 8.5 g/dL — ABNORMAL LOW (ref 12.0–15.0)
O2 Saturation: 100 %
Potassium: 6 mmol/L — ABNORMAL HIGH (ref 3.5–5.1)
Sodium: 139 mmol/L (ref 135–145)
TCO2: 15 mmol/L — ABNORMAL LOW (ref 22–32)
pCO2, Ven: 22.2 mmHg — ABNORMAL LOW (ref 44.0–60.0)
pH, Ven: 7.419 (ref 7.250–7.430)
pO2, Ven: 206 mmHg — ABNORMAL HIGH (ref 32.0–45.0)

## 2020-10-22 LAB — RESP PANEL BY RT-PCR (FLU A&B, COVID) ARPGX2
Influenza A by PCR: NEGATIVE
Influenza B by PCR: NEGATIVE
SARS Coronavirus 2 by RT PCR: NEGATIVE

## 2020-10-22 LAB — BASIC METABOLIC PANEL
Anion gap: 21 — ABNORMAL HIGH (ref 5–15)
Anion gap: 21 — ABNORMAL HIGH (ref 5–15)
Anion gap: 24 — ABNORMAL HIGH (ref 5–15)
BUN: 150 mg/dL — ABNORMAL HIGH (ref 8–23)
BUN: 153 mg/dL — ABNORMAL HIGH (ref 8–23)
BUN: 151 mg/dL — ABNORMAL HIGH (ref 8–23)
CO2: 10 mmol/L — ABNORMAL LOW (ref 22–32)
CO2: 14 mmol/L — ABNORMAL LOW (ref 22–32)
CO2: 11 mmol/L — ABNORMAL LOW (ref 22–32)
Calcium: 7.9 mg/dL — ABNORMAL LOW (ref 8.9–10.3)
Calcium: 7.9 mg/dL — ABNORMAL LOW (ref 8.9–10.3)
Calcium: 7.7 mg/dL — ABNORMAL LOW (ref 8.9–10.3)
Chloride: 105 mmol/L (ref 98–111)
Chloride: 105 mmol/L (ref 98–111)
Chloride: 106 mmol/L (ref 98–111)
Creatinine, Ser: 10.61 mg/dL — ABNORMAL HIGH (ref 0.44–1.00)
Creatinine, Ser: 10.97 mg/dL — ABNORMAL HIGH (ref 0.44–1.00)
Creatinine, Ser: 11 mg/dL — ABNORMAL HIGH (ref 0.44–1.00)
GFR, Estimated: 4 mL/min — ABNORMAL LOW (ref >60)
GFR, Estimated: 3 mL/min — ABNORMAL LOW (ref >60)
GFR, Estimated: 3 mL/min — ABNORMAL LOW (ref >60)
Glucose, Bld: 166 mg/dL — ABNORMAL HIGH (ref 70–99)
Glucose, Bld: 103 mg/dL — ABNORMAL HIGH (ref 70–99)
Glucose, Bld: 282 mg/dL — ABNORMAL HIGH (ref 70–99)
Potassium: 7.2 mmol/L (ref 3.5–5.1)
Potassium: 6.4 mmol/L (ref 3.5–5.1)
Potassium: 4.7 mmol/L (ref 3.5–5.1)
Sodium: 136 mmol/L (ref 135–145)
Sodium: 140 mmol/L (ref 135–145)
Sodium: 141 mmol/L (ref 135–145)

## 2020-10-22 LAB — CBC
HCT: 31.5 % — ABNORMAL LOW (ref 36.0–46.0)
Hemoglobin: 10.1 g/dL — ABNORMAL LOW (ref 12.0–15.0)
MCH: 32.8 pg (ref 26.0–34.0)
MCHC: 32.1 g/dL (ref 30.0–36.0)
MCV: 102.3 fL — ABNORMAL HIGH (ref 80.0–100.0)
Platelets: 166 10*3/uL (ref 150–400)
RBC: 3.08 MIL/uL — ABNORMAL LOW (ref 3.87–5.11)
RDW: 15.3 % (ref 11.5–15.5)
WBC: 6.1 10*3/uL (ref 4.0–10.5)
nRBC: 0 % (ref 0.0–0.2)

## 2020-10-22 LAB — PROTIME-INR
INR: 1.2 (ref 0.8–1.2)
Prothrombin Time: 14.7 seconds (ref 11.4–15.2)

## 2020-10-22 LAB — CBG MONITORING, ED
Glucose-Capillary: 150 mg/dL — ABNORMAL HIGH (ref 70–99)
Glucose-Capillary: 273 mg/dL — ABNORMAL HIGH (ref 70–99)

## 2020-10-22 LAB — PHOSPHORUS: Phosphorus: 8.5 mg/dL — ABNORMAL HIGH (ref 2.5–4.6)

## 2020-10-22 LAB — APTT: aPTT: 35 seconds (ref 24–36)

## 2020-10-22 LAB — MAGNESIUM: Magnesium: 2 mg/dL (ref 1.7–2.4)

## 2020-10-22 MED ORDER — FUROSEMIDE 10 MG/ML IJ SOLN
80.0000 mg | Freq: Once | INTRAMUSCULAR | Status: AC
  Administered 2020-10-22: 80 mg via INTRAVENOUS
  Filled 2020-10-22: qty 8

## 2020-10-22 MED ORDER — ONDANSETRON HCL 4 MG PO TABS: 4.0000 mg | ORAL_TABLET | Freq: Four times a day (QID) | ORAL | Status: DC | PRN

## 2020-10-22 MED ORDER — ALBUTEROL (5 MG/ML) CONTINUOUS INHALATION SOLN
5.0000 mg/h | INHALATION_SOLUTION | RESPIRATORY_TRACT | Status: AC
  Administered 2020-10-22: 5 mg/h via RESPIRATORY_TRACT
  Filled 2020-10-22: qty 40

## 2020-10-22 MED ORDER — SODIUM BICARBONATE 8.4 % IV SOLN
50.0000 meq | Freq: Once | INTRAVENOUS | Status: AC
  Administered 2020-10-22: 50 meq via INTRAVENOUS
  Filled 2020-10-22: qty 50

## 2020-10-22 MED ORDER — DEXTROSE 50 % IV SOLN
1.0000 | Freq: Once | INTRAVENOUS | Status: AC
  Administered 2020-10-22: 50 mL via INTRAVENOUS
  Filled 2020-10-22: qty 50

## 2020-10-22 MED ORDER — INSULIN ASPART 100 UNIT/ML IV SOLN
5.0000 [IU] | Freq: Once | INTRAVENOUS | Status: AC
  Administered 2020-10-22: 5 [IU] via INTRAVENOUS

## 2020-10-22 MED ORDER — GABAPENTIN 300 MG PO CAPS
300.0000 mg | ORAL_CAPSULE | Freq: Every day | ORAL | Status: DC
  Administered 2020-10-22 – 2020-10-24 (×3): 300 mg via ORAL
  Filled 2020-10-22 (×3): qty 1

## 2020-10-22 MED ORDER — AMLODIPINE BESYLATE 10 MG PO TABS
10.0000 mg | ORAL_TABLET | Freq: Every day | ORAL | Status: DC
  Administered 2020-10-23 – 2020-10-24 (×2): 10 mg via ORAL
  Filled 2020-10-22 (×2): qty 1

## 2020-10-22 MED ORDER — ACETAMINOPHEN 325 MG PO TABS: 650.0000 mg | ORAL_TABLET | Freq: Four times a day (QID) | ORAL | Status: DC | PRN

## 2020-10-22 MED ORDER — ROSUVASTATIN CALCIUM 5 MG PO TABS
10.0000 mg | ORAL_TABLET | Freq: Every day | ORAL | Status: DC
  Administered 2020-10-22 – 2020-10-24 (×3): 10 mg via ORAL
  Filled 2020-10-22 (×3): qty 2

## 2020-10-22 MED ORDER — SODIUM ZIRCONIUM CYCLOSILICATE 10 G PO PACK
10.0000 g | PACK | Freq: Two times a day (BID) | ORAL | Status: AC
  Administered 2020-10-22 – 2020-10-23 (×2): 10 g via ORAL
  Filled 2020-10-22 (×2): qty 1

## 2020-10-22 MED ORDER — SODIUM BICARBONATE 650 MG PO TABS
1300.0000 mg | ORAL_TABLET | Freq: Three times a day (TID) | ORAL | Status: DC
  Administered 2020-10-22 – 2020-10-25 (×8): 1300 mg via ORAL
  Filled 2020-10-22 (×11): qty 2

## 2020-10-22 MED ORDER — SODIUM POLYSTYRENE SULFONATE 15 GM/60ML PO SUSP
10.0000 g | Freq: Once | ORAL | Status: AC
  Administered 2020-10-22: 10 g via ORAL
  Filled 2020-10-22: qty 60

## 2020-10-22 MED ORDER — CALCIUM GLUCONATE 10 % IV SOLN: 1.0000 g | Freq: Once | INTRAVENOUS | Status: DC

## 2020-10-22 MED ORDER — CALCIUM GLUCONATE-NACL 1-0.675 GM/50ML-% IV SOLN: 1.0000 g | Freq: Once | INTRAVENOUS | Status: DC

## 2020-10-22 MED ORDER — AMLODIPINE BESYLATE 5 MG PO TABS
10.0000 mg | ORAL_TABLET | Freq: Once | ORAL | Status: AC
  Administered 2020-10-22: 10 mg via ORAL
  Filled 2020-10-22: qty 2

## 2020-10-22 MED ORDER — LINAGLIPTIN 5 MG PO TABS: 5.0000 mg | ORAL_TABLET | Freq: Every day | ORAL | Status: DC

## 2020-10-22 MED ORDER — ONDANSETRON HCL 4 MG/2ML IJ SOLN: 4.0000 mg | Freq: Four times a day (QID) | INTRAMUSCULAR | Status: DC | PRN

## 2020-10-22 MED ORDER — CHLORHEXIDINE GLUCONATE CLOTH 2 % EX PADS
6.0000 | MEDICATED_PAD | Freq: Every day | CUTANEOUS | Status: DC
  Administered 2020-10-24: 6 via TOPICAL

## 2020-10-22 MED ORDER — HYDRALAZINE HCL 25 MG PO TABS: 25.0000 mg | ORAL_TABLET | Freq: Four times a day (QID) | ORAL | Status: DC | PRN

## 2020-10-22 MED ORDER — METOPROLOL TARTRATE 25 MG PO TABS
25.0000 mg | ORAL_TABLET | Freq: Two times a day (BID) | ORAL | Status: DC
  Administered 2020-10-22 – 2020-10-25 (×6): 25 mg via ORAL
  Filled 2020-10-22 (×6): qty 1

## 2020-10-22 MED ORDER — DICLOFENAC SODIUM 1 % EX GEL
2.0000 g | Freq: Four times a day (QID) | CUTANEOUS | Status: DC | PRN
  Filled 2020-10-22: qty 100

## 2020-10-22 MED ORDER — IPRATROPIUM-ALBUTEROL 0.5-2.5 (3) MG/3ML IN SOLN: 3.0000 mL | Freq: Four times a day (QID) | RESPIRATORY_TRACT | Status: DC | PRN

## 2020-10-22 MED ORDER — ACETAMINOPHEN 650 MG RE SUPP: 650.0000 mg | Freq: Four times a day (QID) | RECTAL | Status: DC | PRN

## 2020-10-22 MED ORDER — VENLAFAXINE HCL ER 37.5 MG PO CP24
37.5000 mg | ORAL_CAPSULE | Freq: Every day | ORAL | Status: DC
  Administered 2020-10-22 – 2020-10-24 (×3): 37.5 mg via ORAL
  Filled 2020-10-22 (×4): qty 1

## 2020-10-22 MED ORDER — SODIUM POLYSTYRENE SULFONATE 15 GM/60ML PO SUSP
30.0000 g | Freq: Once | ORAL | Status: AC
  Administered 2020-10-22: 30 g via ORAL
  Filled 2020-10-22: qty 120

## 2020-10-22 MED ORDER — HEPARIN SODIUM (PORCINE) 5000 UNIT/ML IJ SOLN
5000.0000 [IU] | Freq: Two times a day (BID) | INTRAMUSCULAR | Status: DC
  Administered 2020-10-23 – 2020-10-25 (×6): 5000 [IU] via SUBCUTANEOUS
  Filled 2020-10-22 (×5): qty 1

## 2020-10-22 MED ORDER — VITAMIN B-12 1000 MCG PO TABS
1000.0000 ug | ORAL_TABLET | Freq: Every day | ORAL | Status: DC
  Administered 2020-10-23 – 2020-10-25 (×3): 1000 ug via ORAL
  Filled 2020-10-22 (×3): qty 1

## 2020-10-22 MED ORDER — ALBUTEROL SULFATE (2.5 MG/3ML) 0.083% IN NEBU: 2.5000 mg | INHALATION_SOLUTION | RESPIRATORY_TRACT | Status: DC

## 2020-10-22 MED ORDER — INSULIN ASPART 100 UNIT/ML ~~LOC~~ SOLN: 0.0000 [IU] | Freq: Three times a day (TID) | SUBCUTANEOUS | Status: DC

## 2020-10-22 NOTE — ED Notes (Signed)
Pt's CBG result was 150. Informed Hannah - RN.

## 2020-10-22 NOTE — Consult Note (Signed)
  Cindy Burns is a 75 y.o. female with medical history significant of ESRD on HD, HTN, IIDM, depression, HLD, presenting with increasing confusion, lethargic and hypothermia.  Patient admitted she has been feeling depressed lately, but she denied any suicidal ideas or what activity. Her last dialysis was Tuesday, then this Thursday, she "was feeling great, and thinking I can go without it any longer" and did not go HD. This morning, she will wake up feeling very tired and sleepy and cold,) a friend who visited her called EMS. She denied any chest pain, no shortness of breath no fever. She still makes urine.  Psych consult placed for depression. Patient remains confused and requiring higher level of care to include emergency hemodialysis tonight. - Patient has a diagnosis of depression in which she is taking Effexor 37.5mg  po daily. Medication was just initiated after her last admission.  -Psychiatry to sign off, please re-consult once patient is medically stable and able to participate in evaluation.

## 2020-10-22 NOTE — ED Notes (Signed)
Crit k* 6.4 MD notified.

## 2020-10-22 NOTE — ED Notes (Signed)
bair hugger applied to pt

## 2020-10-22 NOTE — ED Provider Notes (Signed)
[facility] EMERGENCY DEPARTMENT Provider Note   CSN: [id number] Arrival date & time: 10/22/20  1420     History Chief Complaint  Patient presents with  . Weakness    Cindy Burns is a 75 y.o. female.  The history is provided by the patient.  Weakness Severity:  Moderate Onset quality:  Gradual Duration:  3 days Timing:  Constant Progression:  Waxing and waning Chronicity:  New Context comment:  Missed dialysis/refusing dialysis Relieved by:  Nothing Worsened by:  Nothing Ineffective treatments:  None tried Associated symptoms: cough, diarrhea and shortness of breath   Associated symptoms: no abdominal pain, no arthralgias, no chest pain, no dysuria, no fever, no seizures and no vomiting   Diarrhea:    Quality:  Watery Risk factors: diabetes        Past Medical History:  Diagnosis Date  . AKI (acute kidney injury) ([place]) 01/2020  . Cancer [facility]) 2005   Breast Cancer  . COPD (chronic obstructive pulmonary disease) ([place])   . Depression   . Diabetes mellitus without complication ([place])   . Hyperlipidemia     Patient Active Problem List   Diagnosis Date Noted  . Hyperkalemia 10/22/2020  . Acute confusion 09/05/2020  . Bilateral leg pain 08/25/2020  . Folate deficiency 08/17/2020  . Acute metabolic encephalopathy 40/98/1191  . Closed traumatic minimally displaced fracture of two ribs 08/15/2020  . Fever 04/30/2020  . ESRD (end stage renal disease) ([place]) 04/30/2020  . Hypocalcemia 01/19/2020  . Metabolic acidosis 47/82/9562  . Acute respiratory failure with hypoxia ([place]) 01/19/2020  . UTI (urinary tract infection) 01/19/2020  . Encounter for central line placement   . Pain and swelling of ankle, left   . Pressure injury of skin 01/09/2020  . Renal failure 01/08/2020  . Acute renal failure on dialysis ([place])   . Hepatomegaly 07/30/2019  . Right carotid bruit 07/30/2019  . SIADH (syndrome of inappropriate ADH production) ([place]) 04/06/2019   . B12 deficiency 04/06/2019  . Balance problem 03/18/2019  . Ventral hernia 04/03/2017  . Hypertension associated with diabetes ([place]) 02/28/2017  . Chronic insomnia 10/26/2013  . Diabetic autonomic neuropathy ([place]) 08/12/2013  . PULMONARY NODULE 09/27/2009  . ADENOCARCINOMA, BREAST 02/28/2009  . Normocytic anemia 06/24/2008  . Moderate COPD (chronic obstructive pulmonary disease) ([place]) 03/14/2008  . Major depressive disorder, recurrent episode, moderate ([place]) 08/11/2007  . Diabetes mellitus due to underlying condition, controlled, with neurologic complication ([place]) 13/05/6577  . Hyperlipidemia associated with type 2 diabetes mellitus ([place]) 04/30/2007  . CIGARETTE SMOKER 04/22/2007    Past Surgical History:  Procedure Laterality Date  . BREAST SURGERY  2005  . CATARACT EXTRACTION    . CHOLECYSTECTOMY  2013  . IR FLUORO GUIDE CV LINE RIGHT  02/01/2020  . IR US GUIDE VASC ACCESS RIGHT  02/01/2020     OB History   No obstetric history on file.     Family History  Problem Relation Age of Onset  . Arthritis Mother   . Alzheimer's disease Mother   . Dementia Father   . COPD Brother   . COPD Brother     Social History   Tobacco Use  . Smoking status: Former Smoker    Packs/day: 1.00    Years: 40.00    Pack years: 40.00    Types: Cigarettes  . Smokeless tobacco: Never Used  Vaping Use  . Vaping Use: Never used  Substance Use Topics  . Alcohol use: No  . Drug use: No  Home Medications Prior to Admission medications   Medication Sig Start Date End Date Taking? Authorizing Provider  albuterol (VENTOLIN HFA) 108 (90 Base) MCG/ACT inhaler TAKE 2 PUFFS BY MOUTH EVERY 6 HOURS AS NEEDED FOR WHEEZE OR SHORTNESS OF BREATH Patient taking differently: Inhale 2 puffs into the lungs every 6 (six) hours as needed for wheezing or shortness of breath.  04/24/20   Bedsole, Amy E, MD  amLODipine (NORVASC) 10 MG tablet Take 1 tablet (10 mg total) by mouth daily. Patient taking  differently: Take 10 mg by mouth at bedtime.  02/20/20 04/29/29  Alma Friendly, MD  diclofenac Sodium (VOLTAREN) 1 % GEL Apply 2-4 g topically 4 (four) times daily as needed (to painful sites).  08/22/20   [provider]  folic acid (FOLVITE) 1 MG tablet Take 1 tablet (1 mg total) by mouth daily. Patient not taking: Reported on 09/05/2020 08/17/20 08/17/21  Debbe Odea, MD  gabapentin (NEURONTIN) 300 MG capsule 300 mg after dialysis only on dialysis days Patient taking differently: Take 300 mg by mouth at bedtime.  08/25/20   Bedsole, Amy E, MD  ipratropium-albuterol (DUONEB) 0.5-2.5 (3) MG/3ML SOLN Take 3 mLs by nebulization every 6 (six) hours as needed. Patient taking differently: Take 3 mLs by nebulization every 6 (six) hours as needed (for shortness of breath or wheezing).  02/19/20 04/29/29  Alma Friendly, MD  metoprolol tartrate (LOPRESSOR) 25 MG tablet Take 1 tablet (25 mg total) by mouth 2 (two) times daily. Patient taking differently: Take 25 mg by mouth at bedtime.  08/17/20 09/16/20  Debbe Odea, MD  rosuvastatin (CRESTOR) 20 MG tablet TAKE 1 TABLET BY MOUTH EVERY DAY Patient taking differently: Take 20 mg by mouth at bedtime.  07/27/20   Bedsole, Amy E, MD  sitaGLIPtin (JANUVIA) 25 MG tablet Take 1 tablet (25 mg total) by mouth daily. Patient taking differently: Take 25 mg by mouth at bedtime.  06/19/20   Bedsole, Amy E, MD  venlafaxine XR (EFFEXOR-XR) 37.5 MG 24 hr capsule Take 1 capsule (37.5 mg total) by mouth at bedtime. 09/27/20   Bedsole, Amy E, MD  vitamin B-12 (CYANOCOBALAMIN) 1000 MCG tablet Take 1 tablet (1,000 mcg total) by mouth daily. 08/17/20   Debbe Odea, MD    Allergies    Patient has no known allergies.  Review of Systems   Review of Systems  Constitutional: Positive for fatigue. Negative for chills and fever.  HENT: Negative for ear pain and sore throat.   Eyes: Negative for pain and visual disturbance.  Respiratory: Positive for  cough and shortness of breath.   Cardiovascular: Negative for chest pain and palpitations.  Gastrointestinal: Positive for diarrhea. Negative for abdominal pain and vomiting.  Genitourinary: Negative for dysuria and hematuria.  Musculoskeletal: Negative for arthralgias and back pain.  Skin: Negative for color change and rash.  Neurological: Positive for weakness. Negative for seizures and syncope.  All other systems reviewed and are negative.   Physical Exam Updated Vital Signs BP (!) 131/93   Pulse (!) 105   Temp 98.2 F (36.8 C) (Rectal)   Resp (!) 23   SpO2 100%   Physical Exam Constitutional:      Appearance: Normal appearance. She is cachectic. She is ill-appearing.  HENT:     Head: Normocephalic and atraumatic.     Mouth/Throat:     Mouth: Mucous membranes are dry.  Eyes:     Conjunctiva/sclera: Conjunctivae normal.     Pupils: Pupils are equal, round, and  reactive to light. Pupils are equal.  Cardiovascular:     Rate and Rhythm: Normal rate and regular rhythm.     Pulses:          Radial pulses are 2+ on the right side and 2+ on the left side.  Pulmonary:     Effort: Pulmonary effort is normal. No respiratory distress.  Chest:    Abdominal:     Palpations: Abdomen is soft.     Tenderness: There is no abdominal tenderness.  Skin:    General: Skin is cool.     Capillary Refill: Capillary refill takes less than 2 seconds.  Neurological:     Mental Status: She is alert.     GCS: GCS eye subscore is 4. GCS verbal subscore is 5. GCS motor subscore is 6.     ED Results / Procedures / Treatments   Labs (all labs ordered are listed, but only abnormal results are displayed) Labs Reviewed  BASIC METABOLIC PANEL - Abnormal; Notable for the following components:      Result Value   Potassium 7.2 (*)    CO2 10 (*)    Glucose, Bld 166 (*)    BUN 150 (*)    Creatinine, Ser 10.61 (*)    Calcium 7.9 (*)    GFR, Estimated 4 (*)    Anion gap 21 (*)    All other  components within normal limits  CBC - Abnormal; Notable for the following components:   RBC 3.08 (*)    Hemoglobin 10.1 (*)    HCT 31.5 (*)    MCV 102.3 (*)    All other components within normal limits  PHOSPHORUS - Abnormal; Notable for the following components:   Phosphorus 8.5 (*)    All other components within normal limits  BASIC METABOLIC PANEL - Abnormal; Notable for the following components:   Potassium 6.4 (*)    CO2 14 (*)    Glucose, Bld 103 (*)    BUN 153 (*)    Creatinine, Ser 10.97 (*)    Calcium 7.9 (*)    GFR, Estimated 3 (*)    Anion gap 21 (*)    All other components within normal limits  CBG MONITORING, ED - Abnormal; Notable for the following components:   Glucose-Capillary 150 (*)    All other components within normal limits  I-STAT VENOUS BLOOD GAS, ED - Abnormal; Notable for the following components:   pCO2, Ven 22.2 (*)    pO2, Ven 206.0 (*)    Bicarbonate 14.4 (*)    TCO2 15 (*)    Acid-base deficit 9.0 (*)    Potassium 6.0 (*)    Calcium, Ion 0.87 (*)    HCT 25.0 (*)    Hemoglobin 8.5 (*)    All other components within normal limits  RESP PANEL BY RT-PCR (FLU A&B, COVID) ARPGX2  CULTURE, BLOOD (SINGLE)  URINE CULTURE  MAGNESIUM  LACTIC ACID, PLASMA  LACTIC ACID, PLASMA  PROTIME-INR  APTT  URINALYSIS, ROUTINE W REFLEX MICROSCOPIC  BASIC METABOLIC PANEL  CBC  BASIC METABOLIC PANEL    EKG EKG Interpretation  Date/Time:  Sunday October 22 2020 14:40:23 EST Ventricular Rate:  84 PR Interval:  166 QRS Duration: 88 QT Interval:  420 QTC Calculation: 496 R Axis:   82 Text Interpretation: Normal sinus rhythm Prolonged QT Abnormal ECG No significant change since last tracing No STEMI Confirmed by Nanda Quinton [phone]) on 10/22/2020 4:00:09 PM   Radiology DG Chest Port 1  View  Result Date: 10/22/2020 CLINICAL DATA:  Sepsis, evaluate for abnormality. Refusing dialysis. EXAM: PORTABLE CHEST 1 VIEW COMPARISON:  Chest x-ray 09/05/2020, CT  chest 02/11/2014 FINDINGS: Right chest wall dialysis catheter with tip overlying the right atrium. The heart size and mediastinal contours are unchanged. Aortic arch calcifications. Vague airspace opacity within the peripheral mid lung zone. Chronic increased interstitial markings with no overt pulmonary edema. No pleural effusion. No pneumothorax. No acute osseous abnormality. Known thoracic compression fractures not well visualized. Surgical clips overlie the left axilla. IMPRESSION: Vague airspace opacity within the peripheral mid lung zone. Recommend repeat PA and lateral view of the chest. Electronically Signed   By: Iven Finn M.D.   On: 10/22/2020 16:22    Procedures Procedures (including critical care time)  Medications Ordered in ED Medications  sodium polystyrene (KAYEXALATE) 15 GM/60ML suspension 10 g (has no administration in time range)  amLODipine (NORVASC) tablet 10 mg (has no administration in time range)  metoprolol tartrate (LOPRESSOR) tablet 25 mg (has no administration in time range)  rosuvastatin (CRESTOR) tablet 10 mg (has no administration in time range)  venlafaxine XR (EFFEXOR-XR) 24 hr capsule 37.5 mg (has no administration in time range)  vitamin B-12 (CYANOCOBALAMIN) tablet 1,000 mcg (has no administration in time range)  gabapentin (NEURONTIN) capsule 300 mg (has no administration in time range)  ipratropium-albuterol (DUONEB) 0.5-2.5 (3) MG/3ML nebulizer solution 3 mL (has no administration in time range)  diclofenac Sodium (VOLTAREN) 1 % topical gel 2 g (has no administration in time range)  heparin injection 5,000 Units (has no administration in time range)  acetaminophen (TYLENOL) tablet 650 mg (has no administration in time range)    Or  acetaminophen (TYLENOL) suppository 650 mg (has no administration in time range)  ondansetron (ZOFRAN) tablet 4 mg (has no administration in time range)    Or  ondansetron (ZOFRAN) injection 4 mg (has no administration in  time range)  sodium bicarbonate tablet 1,300 mg (has no administration in time range)  insulin aspart (novoLOG) injection 0-6 Units (has no administration in time range)  albuterol (PROVENTIL,VENTOLIN) solution continuous neb (has no administration in time range)  hydrALAZINE (APRESOLINE) tablet 25 mg (has no administration in time range)  sodium zirconium cyclosilicate (LOKELMA) packet 10 g (has no administration in time range)  insulin aspart (novoLOG) injection 5 Units (has no administration in time range)    And  dextrose 50 % solution 50 mL (has no administration in time range)  insulin aspart (novoLOG) injection 5 Units (5 Units Intravenous Given 10/22/20 1747)    And  dextrose 50 % solution 50 mL (50 mLs Intravenous Given 10/22/20 1747)  sodium polystyrene (KAYEXALATE) 15 GM/60ML suspension 30 g (30 g Oral Given 10/22/20 1746)  amLODipine (NORVASC) tablet 10 mg (10 mg Oral Given 10/22/20 1754)  furosemide (LASIX) injection 80 mg (80 mg Intravenous Given 10/22/20 1829)  sodium bicarbonate injection 50 mEq (50 mEq Intravenous Given 10/22/20 1829)    ED Course  I have reviewed the triage vital signs and the nursing notes.  Pertinent labs & imaging results that were available during my care of the patient were reviewed by me and considered in my medical decision making (see chart for details).    MDM Rules/Calculators/A&P                          past medical history significant for end-stage renal disease on hemodialysis, COPD, depression, history of breast cancer, noncompliant with  hemodialysis.  Patient reports that she has not had dialysis in a week, stating that she did not want to go because it is 3 and half hours of "boredom".  Over the last 3 days she has felt generally weak, has had a mild cough, and has been experiencing some diarrhea over the last 2 days.  She denies any fever or chills, does feel that she is little short of breath.  She receives her dialysis that he screened  several, is not sure who her nephrologist is.   On arrival she is hypothermic 94.1 axillary, confirm 93.9 rectally, bear hugger applied.  Mildly hypertensive.  She is saturating well on room air.  She is chronically ill-appearing, underweight, poorly groomed.  Abdomen is soft and nontender.  Heart and lungs are within normal limits.  No peripheral edema.  She has equal strength and sensation to bilateral lower extremities.  We will plan to contact nephrology to discuss dialysis given her missed sessions as well as hyperkalemia.  EKG shows no evidence of myocardial irritation with normal T waves, no QRS lengthening or loss of P waves.  Given her hypothermia and reported cough, diarrhea, concern for systemic infection, blood cultures and sepsis labs ordered.  She is not hypotensive, will hold off on fluid administration at this time given she is a dialysis patient.  Her temperature normalized with a bear hugger.  Her labs are consistent with her known end-stage renal disease and missing dialysis with hyperkalemia, hyperphosphatemia, elevated BUN.  I did speak with nephrology, unfortunately they had nursing staff call off tonight and are not able to emergently dialyze her, recommend treating hyperkalemia and will dialyze when able.  Patient was given insulin and dextrose, Kayexalate to treat hyperkalemia.  Patient was admitted to hospitalist for further management while awaiting dialysis.  She did have an episode of some increased confusion, repeat neurologic exam shows continued equal strength and sensation, she does appear slightly confused which is likely secondary to her known uremia.  Repeat ECG again does not show any hyperkalemic changes and a VBG shows potassium is down to 6.0 with a normal pH and bicarb up to 14.  Her blood pressure is downtrending with her home amlodipine that was given, most recent is 158/84.  Final Clinical Impression(s) / ED Diagnoses Final diagnoses:  Hyperkalemia  Acute  metabolic encephalopathy  ESRD (end stage renal disease) ([place])    Rx / DC Orders ED Discharge Orders    None       Camila Li, MD 10/22/20 1947    Margette Fast, MD 10/22/20 2152

## 2020-10-22 NOTE — ED Notes (Signed)
Dr. Laverta [place] notified of Potassium 7.2 and temp 94.1.  Pt going to treatment room.

## 2020-10-22 NOTE — H&P (Signed)
History and Physical    Cindy Burns AQT:[id number] DOB: May 15, 1946 DOA: 10/22/2020  PCP: [name], MD (Confirm with patient/family/NH records and if not entered, this has to be entered at [facility] point of entry) Patient coming from: Home  I have personally briefly reviewed patient's old medical records in [place]  Chief Complaint: Feeling weak  HPI: Cindy Burns is a 75 y.o. female with medical history significant of ESRD on HD, HTN, IIDM, depression, HLD, presenting with increasing confusion, lethargic and hypothermia.  Patient admitted she has been feeling depressed lately, but she denied any suicidal ideas or what activity. Her last dialysis was Tuesday, then this Thursday, she "was feeling great, and thinking I can go without it any longer" and did not go HD. This morning, she will wake up feeling very tired and sleepy and cold,) a friend who visited her called EMS. She denied any chest pain, no shortness of breath no fever. She still makes urine. ED Course: Uremia BUN 150, bicarb 10, potassium 7.2. Received a cocktail of insulin and D50. EKG showed no significant tented T waves.  Review of Systems: As per HPI otherwise 14 point review of systems negative.    Past Medical History:  Diagnosis Date  . AKI (acute kidney injury) ([place]) 01/2020  . Cancer [facility]) 2005   Breast Cancer  . COPD (chronic obstructive pulmonary disease) ([place])   . Depression   . Diabetes mellitus without complication ([place])   . Hyperlipidemia     Past Surgical History:  Procedure Laterality Date  . BREAST SURGERY  2005  . CATARACT EXTRACTION    . CHOLECYSTECTOMY  2013  . IR FLUORO GUIDE CV LINE RIGHT  02/01/2020  . IR US GUIDE VASC ACCESS RIGHT  02/01/2020     reports that she has quit smoking. Her smoking use included cigarettes. She has a 40.00 pack-year smoking history. She has never used smokeless tobacco. She reports that she does not drink alcohol and does not use drugs.  No Known  Allergies  Family History  Problem Relation Age of Onset  . Arthritis Mother   . Alzheimer's disease Mother   . Dementia Father   . COPD Brother   . COPD Brother      Prior to Admission medications   Medication Sig Start Date End Date Taking? Authorizing Provider  albuterol (VENTOLIN HFA) 108 (90 Base) MCG/ACT inhaler TAKE 2 PUFFS BY MOUTH EVERY 6 HOURS AS NEEDED FOR WHEEZE OR SHORTNESS OF BREATH Patient taking differently: Inhale 2 puffs into the lungs every 6 (six) hours as needed for wheezing or shortness of breath.  04/24/20   Bedsole, Amy E, MD  amLODipine (NORVASC) 10 MG tablet Take 1 tablet (10 mg total) by mouth daily. Patient taking differently: Take 10 mg by mouth at bedtime.  02/20/20 04/29/29  Alma Friendly, MD  diclofenac Sodium (VOLTAREN) 1 % GEL Apply 2-4 g topically 4 (four) times daily as needed (to painful sites).  08/22/20   [provider]  folic acid (FOLVITE) 1 MG tablet Take 1 tablet (1 mg total) by mouth daily. Patient not taking: Reported on 09/05/2020 08/17/20 08/17/21  Debbe Odea, MD  gabapentin (NEURONTIN) 300 MG capsule 300 mg after dialysis only on dialysis days Patient taking differently: Take 300 mg by mouth at bedtime.  08/25/20   Bedsole, Amy E, MD  ipratropium-albuterol (DUONEB) 0.5-2.5 (3) MG/3ML SOLN Take 3 mLs by nebulization every 6 (six) hours as needed. Patient taking differently: Take  3 mLs by nebulization every 6 (six) hours as needed (for shortness of breath or wheezing).  02/19/20 04/29/29  Alma Friendly, MD  metoprolol tartrate (LOPRESSOR) 25 MG tablet Take 1 tablet (25 mg total) by mouth 2 (two) times daily. Patient taking differently: Take 25 mg by mouth at bedtime.  08/17/20 09/16/20  Debbe Odea, MD  rosuvastatin (CRESTOR) 20 MG tablet TAKE 1 TABLET BY MOUTH EVERY DAY Patient taking differently: Take 20 mg by mouth at bedtime.  07/27/20   Bedsole, Amy E, MD  sitaGLIPtin (JANUVIA) 25 MG tablet Take 1 tablet (25 mg  total) by mouth daily. Patient taking differently: Take 25 mg by mouth at bedtime.  06/19/20   Bedsole, Amy E, MD  venlafaxine XR (EFFEXOR-XR) 37.5 MG 24 hr capsule Take 1 capsule (37.5 mg total) by mouth at bedtime. 09/27/20   Bedsole, Amy E, MD  vitamin B-12 (CYANOCOBALAMIN) 1000 MCG tablet Take 1 tablet (1,000 mcg total) by mouth daily. 08/17/20   Debbe Odea, MD    Physical Exam: Vitals:   10/22/20 1600 10/22/20 1628 10/22/20 1730 10/22/20 1800  BP:  (!) 200/90 (!) 198/87 (!) 158/84  Pulse:  78 85 94  Resp:  15 19 (!) 22  Temp: (!) 93.8 F (34.3 C)   98.2 F (36.8 C)  TempSrc: Rectal   Rectal  SpO2:  100% 100% 100%    Constitutional: NAD, calm, comfortable Vitals:   10/22/20 1600 10/22/20 1628 10/22/20 1730 10/22/20 1800  BP:  (!) 200/90 (!) 198/87 (!) 158/84  Pulse:  78 85 94  Resp:  15 19 (!) 22  Temp: (!) 93.8 F (34.3 C)   98.2 F (36.8 C)  TempSrc: Rectal   Rectal  SpO2:  100% 100% 100%   Eyes: PERRL, lids and conjunctivae normal ENMT: Mucous membranes are moist. Posterior pharynx clear of any exudate or lesions.Normal dentition.  Neck: normal, supple, no masses, no thyromegaly Respiratory: clear to auscultation bilaterally, no wheezing, no crackles. Normal respiratory effort. No accessory muscle use.  Cardiovascular: Regular rate and rhythm, no murmurs / rubs / gallops. No extremity edema. 2+ pedal pulses. No carotid bruits.  Abdomen: no tenderness, no masses palpated. No hepatosplenomegaly. Bowel sounds positive.  Musculoskeletal: no clubbing / cyanosis. No joint deformity upper and lower extremities. Good ROM, no contractures. Normal muscle tone.  Skin: no rashes, lesions, ulcers. No induration Neurologic: Facial droop, moving all limbs, following simple commands psychiatric: Confused    Labs on Admission: I have personally reviewed following labs and imaging studies  CBC: Recent Labs  Lab 10/22/20 1444  WBC 6.1  HGB 10.1*  HCT 31.5*  MCV 102.3*  PLT  924   Basic Metabolic Panel: Recent Labs  Lab 10/22/20 1444 10/22/20 1640  NA 136  --   K 7.2*  --   CL 105  --   CO2 10*  --   GLUCOSE 166*  --   BUN 150*  --   CREATININE 10.61*  --   CALCIUM 7.9*  --   MG  --  2.0  PHOS  --  8.5*   GFR: CrCl cannot be calculated (Unknown ideal weight.). Liver Function Tests: No results for input(s): AST, ALT, ALKPHOS, BILITOT, PROT, ALBUMIN in the last 168 hours. No results for input(s): LIPASE, AMYLASE in the last 168 hours. No results for input(s): AMMONIA in the last 168 hours. Coagulation Profile: Recent Labs  Lab 10/22/20 1640  INR 1.2   Cardiac Enzymes: No results for input(s): CKTOTAL,  CKMB, CKMBINDEX, TROPONINI in the last 168 hours. BNP (last 3 results) No results for input(s): PROBNP in the last 8760 hours. HbA1C: No results for input(s): HGBA1C in the last 72 hours. CBG: Recent Labs  Lab 10/22/20 1620  GLUCAP 150*   Lipid Profile: No results for input(s): CHOL, HDL, LDLCALC, TRIG, CHOLHDL, LDLDIRECT in the last 72 hours. Thyroid Function Tests: No results for input(s): TSH, T4TOTAL, FREET4, T3FREE, THYROIDAB in the last 72 hours. Anemia Panel: No results for input(s): VITAMINB12, FOLATE, FERRITIN, TIBC, IRON, RETICCTPCT in the last 72 hours. Urine analysis:    Component Value Date/Time   COLORURINE COLORLESS (A) 09/06/2020 0547   APPEARANCEUR TURBID (A) 09/06/2020 0547   LABSPEC 1.004 (L) 09/06/2020 0547   PHURINE 7.0 09/06/2020 0547   GLUCOSEU NEGATIVE 09/06/2020 0547   HGBUR SMALL (A) 09/06/2020 0547   HGBUR trace-intact 06/24/2008 0912   BILIRUBINUR NEGATIVE 09/06/2020 0547   BILIRUBINUR negative 09/23/2013 1559   KETONESUR NEGATIVE 09/06/2020 0547   PROTEINUR 100 (A) 09/06/2020 0547   UROBILINOGEN 0.2 09/23/2013 1559   UROBILINOGEN 0.2 06/24/2008 0912   NITRITE NEGATIVE 09/06/2020 0547   LEUKOCYTESUR MODERATE (A) 09/06/2020 0547    Radiological Exams on Admission: DG Chest Port 1 View  Result  Date: 10/22/2020 CLINICAL DATA:  Sepsis, evaluate for abnormality. Refusing dialysis. EXAM: PORTABLE CHEST 1 VIEW COMPARISON:  Chest x-ray 09/05/2020, CT chest 02/11/2014 FINDINGS: Right chest wall dialysis catheter with tip overlying the right atrium. The heart size and mediastinal contours are unchanged. Aortic arch calcifications. Vague airspace opacity within the peripheral mid lung zone. Chronic increased interstitial markings with no overt pulmonary edema. No pleural effusion. No pneumothorax. No acute osseous abnormality. Known thoracic compression fractures not well visualized. Surgical clips overlie the left axilla. IMPRESSION: Vague airspace opacity within the peripheral mid lung zone. Recommend repeat PA and lateral view of the chest. Electronically Signed   By: Iven Finn M.D.   On: 10/22/2020 16:22    EKG: Independently reviewed. Sinus rhythm, improved T morphology compared to 4 h ago. Assessment/Plan Active Problems:   Hyperkalemia  (please populate well all problems here in Problem List. (For example, if patient is on BP meds at home and you resume or decide to hold them, it is a problem that needs to be her. Same for CAD, COPD, HLD and so on)  Hyperkalemia -Secondary to missed dialysis -Received cocktail of insulin and D50 -I also ordered 1 dose of 1 amp bicarb IV push and IV Lasixx1. -Nephrology to arrange emergency dialysis tonight. -Continuous albuterol nebulizer -LoKelma -Repeat BMP 20:00 to decide further cocktail treatment.  Depression -Denied any suicidal ideation -Consult psychiatry  ESRD on HD -With missed dialysis now has hyperkalemia, worsening of acidosis, and significant uremia and azotemia -Emergency HD tonight  IIDM -Switch to insulin sliding scale for now  Asthma COPD -No symptoms or signs of acute exacerbation  HTN uncontrolled -From missed HD, continue amlodipine and metoprolol -Lasix x1.  DVT prophylaxis: Heparin subcu Code Status: Full  code Family Communication: None at bedside Disposition Plan: Expect dialysis today and tomorrow, likely will need more than 2 midnight hospital stay. Consults called: Nephrology Admission status: PCU   Lequita Halt MD [facility] Pager [phone]  10/22/2020, 7:11 PM

## 2020-10-22 NOTE — ED Triage Notes (Addendum)
Pt to triage via [facility] from home.  EMS called because pt is refusing dialysis.  Pt states she no longer wants to go to dialysis.  Denies SI.  Reports generalized weakness.  Last dialysis on Tuesday.

## 2020-10-22 NOTE — Consult Note (Signed)
Renal Service Consult Note [facility]  MCKENZYE CUTRIGHT 10/22/2020 Sol Blazing, MD Requesting Physician: Dr Roosevelt Locks  Reason for Consult: ESRD pt w/ AMS HPI: The patient is a 75 y.o. year-old w/ hx of HL, DM2, HTN and ESRD on HD presented to Ed w/ increasing consufion, lethargy and hypothermia. She is not sure what her last HD day was. She stopped going to HD. She woke up today feeling very tired and cold. Friend who visited called EMS.  No CP or SOB. No abd pain. In ED BUN 150  Bicarb K 7.0 10 creat 10  Ca 7.9 Hb 8.5, wbc 6k. bp 170 86, hr 80, RR 15- 22.   RA 100% sat. Temp 93 deg > 98 repeat. Pt was treated acutely w/ temporizing measures for hyperkalemia.  Asked to see for ESRD.    Pt seen in ED.  Not sure when last HD was.  HR is high after taking the high dose alb nebs and pt shaking, she wasn't doing that earlier said the RN.   Pt denies any CP, SOB, has some cough, nonprod, no fevers or chills. Was cold this am.    ROS  denies CP  no joint pain   no HA  no blurry vision  no rash  no diarrhea  no nausea/ vomiting   Past Medical History  Past Medical History:  Diagnosis Date  . AKI (acute kidney injury) ([place]) 01/2020  . Cancer [facility]) 2005   Breast Cancer  . COPD (chronic obstructive pulmonary disease) ([place])   . Depression   . Diabetes mellitus without complication ([place])   . Hyperlipidemia    Past Surgical History  Past Surgical History:  Procedure Laterality Date  . BREAST SURGERY  2005  . CATARACT EXTRACTION    . CHOLECYSTECTOMY  2013  . IR FLUORO GUIDE CV LINE RIGHT  02/01/2020  . IR US GUIDE VASC ACCESS RIGHT  02/01/2020   Family History  Family History  Problem Relation Age of Onset  . Arthritis Mother   . Alzheimer's disease Mother   . Dementia Father   . COPD Brother   . COPD Brother    Social History  reports that she has quit smoking. Her smoking use included cigarettes. She has a 40.00 pack-year smoking history. She has never used  smokeless tobacco. She reports that she does not drink alcohol and does not use drugs. Allergies No Known Allergies Home medications Prior to Admission medications   Medication Sig Start Date End Date Taking? Authorizing Provider  albuterol (VENTOLIN HFA) 108 (90 Base) MCG/ACT inhaler TAKE 2 PUFFS BY MOUTH EVERY 6 HOURS AS NEEDED FOR WHEEZE OR SHORTNESS OF BREATH Patient taking differently: Inhale 2 puffs into the lungs every 6 (six) hours as needed for wheezing or shortness of breath.  04/24/20   Bedsole, Amy E, MD  amLODipine (NORVASC) 10 MG tablet Take 1 tablet (10 mg total) by mouth daily. Patient taking differently: Take 10 mg by mouth at bedtime.  02/20/20 04/29/29  Alma Friendly, MD  diclofenac Sodium (VOLTAREN) 1 % GEL Apply 2-4 g topically 4 (four) times daily as needed (to painful sites).  08/22/20   [provider]  folic acid (FOLVITE) 1 MG tablet Take 1 tablet (1 mg total) by mouth daily. Patient not taking: Reported on 09/05/2020 08/17/20 08/17/21  Debbe Odea, MD  gabapentin (NEURONTIN) 300 MG capsule 300 mg after dialysis only on dialysis days Patient taking differently: Take 300 mg by mouth at  bedtime.  08/25/20   Bedsole, Amy E, MD  ipratropium-albuterol (DUONEB) 0.5-2.5 (3) MG/3ML SOLN Take 3 mLs by nebulization every 6 (six) hours as needed. Patient taking differently: Take 3 mLs by nebulization every 6 (six) hours as needed (for shortness of breath or wheezing).  02/19/20 04/29/29  Alma Friendly, MD  metoprolol tartrate (LOPRESSOR) 25 MG tablet Take 1 tablet (25 mg total) by mouth 2 (two) times daily. Patient taking differently: Take 25 mg by mouth at bedtime.  08/17/20 09/16/20  Debbe Odea, MD  rosuvastatin (CRESTOR) 20 MG tablet TAKE 1 TABLET BY MOUTH EVERY DAY Patient taking differently: Take 20 mg by mouth at bedtime.  07/27/20   Bedsole, Amy E, MD  sitaGLIPtin (JANUVIA) 25 MG tablet Take 1 tablet (25 mg total) by mouth daily. Patient taking  differently: Take 25 mg by mouth at bedtime.  06/19/20   Bedsole, Amy E, MD  venlafaxine XR (EFFEXOR-XR) 37.5 MG 24 hr capsule Take 1 capsule (37.5 mg total) by mouth at bedtime. 09/27/20   Bedsole, Amy E, MD  vitamin B-12 (CYANOCOBALAMIN) 1000 MCG tablet Take 1 tablet (1,000 mcg total) by mouth daily. 08/17/20   Debbe Odea, MD     Vitals:   10/22/20 2028 10/22/20 2030 10/22/20 2100 10/22/20 2127  BP:  (!) 164/67  132/64  Pulse: (!) 114 (!) 104  (!) 121  Resp: (!) 22 19    Temp:      TempSrc:      SpO2: 95% 100% 96%    Exam Gen thin frail , elderly WF, alert, shaky and confused but cooperative No rash, cyanosis or gangrene Sclera anicteric, throat clear   No jvd or bruits Chest clear bilat to bases no wheezing , good air movement RRR no MRG tachy Abd soft ntnd no mass or ascites +bs GU defer MS no joint effusions or deformity Ext no leg or UE edema, no wounds or ulcers Neuro is alert, Ox 2  RIJ TDC intact, no drainage    Home meds:  - norvasc 10 / metoprolol 25 bid/ crestor 20  - effexor xr 37.5 hs/ neurontin 300 hs  - duoneb qid prn  - sitagliptin 25 mg hs  - prn's/ vitamins/ supplements     CXR 1/16 - IMPRESSION: Vague airspace opacity within the peripheral mid lung zone. Recommend repeat PA and lateral view of the chest.   [facility]  from 08/2020 > 3.5h  51.5kg  Hep 2000   TDC R IJ    EKG at 2 pm and at 7 pm both show narrow QRS 88-90 msec  Assessment/ Plan: 1. AMS - suspect uremia primary issue w/ missed HD sessions.  2. Hyperkalemia - EKG w/o sig changes. Given temporizing measures in ED, in addition I recommended kayexalate 40 gm and lokelma 4 hr later. Plan HD in am Monday.   3. ESRD - on HD TTS.  Has missed HD , not sure how many.  4. Depression - seen by psych, not sure she has any SI at this time.  5. HTN - cont meds, no vol excess by exam or CXR 6. Anemia ckd - get records am 7. MBD ckd - cont meds       Kelly Splinter  MD 10/22/2020, 9:42  PM  Recent Labs  Lab 10/22/20 1444 10/22/20 1916  WBC 6.1  --   HGB 10.1* 8.5*   Recent Labs  Lab 10/22/20 1444 10/22/20 1640 10/22/20 1910 10/22/20 1916  K 7.2*  --  6.4* 6.0*  BUN 150*  --  153*  --   CREATININE 10.61*  --  10.97*  --   CALCIUM 7.9*  --  7.9*  --   PHOS  --  8.5*  --   --

## 2020-10-23 ENCOUNTER — Inpatient Hospital Stay (HOSPITAL_COMMUNITY): Payer: Medicare Other

## 2020-10-23 LAB — BASIC METABOLIC PANEL
Anion gap: 25 — ABNORMAL HIGH (ref 5–15)
BUN: 148 mg/dL — ABNORMAL HIGH (ref 8–23)
CO2: 13 mmol/L — ABNORMAL LOW (ref 22–32)
Calcium: 7.5 mg/dL — ABNORMAL LOW (ref 8.9–10.3)
Chloride: 104 mmol/L (ref 98–111)
Creatinine, Ser: 10.86 mg/dL — ABNORMAL HIGH (ref 0.44–1.00)
GFR, Estimated: 3 mL/min — ABNORMAL LOW (ref >60)
Glucose, Bld: 94 mg/dL (ref 70–99)
Potassium: 4.6 mmol/L (ref 3.5–5.1)
Sodium: 142 mmol/L (ref 135–145)

## 2020-10-23 LAB — CBC
HCT: 28.2 % — ABNORMAL LOW (ref 36.0–46.0)
Hemoglobin: 9.1 g/dL — ABNORMAL LOW (ref 12.0–15.0)
MCH: 31.8 pg (ref 26.0–34.0)
MCHC: 32.3 g/dL (ref 30.0–36.0)
MCV: 98.6 fL (ref 80.0–100.0)
Platelets: 177 10*3/uL (ref 150–400)
RBC: 2.86 MIL/uL — ABNORMAL LOW (ref 3.87–5.11)
RDW: 15.4 % (ref 11.5–15.5)
WBC: 5 10*3/uL (ref 4.0–10.5)
nRBC: 0 % (ref 0.0–0.2)

## 2020-10-23 LAB — GLUCOSE, CAPILLARY
Glucose-Capillary: 93 mg/dL (ref 70–99)
Glucose-Capillary: 137 mg/dL — ABNORMAL HIGH (ref 70–99)

## 2020-10-23 MED ORDER — HEPARIN SODIUM (PORCINE) 1000 UNIT/ML IJ SOLN
INTRAMUSCULAR | Status: AC
  Administered 2020-10-23: 1000 [IU]
  Filled 2020-10-23: qty 4

## 2020-10-23 MED ORDER — HEPARIN SODIUM (PORCINE) 1000 UNIT/ML DIALYSIS: 2000.0000 [IU] | Freq: Once | INTRAMUSCULAR | Status: DC

## 2020-10-23 NOTE — Progress Notes (Signed)
[facility] Progress Note  Subjective: seen on HD, trembling is less, knows year not place  Vitals:   10/23/20 0735 10/23/20 0800 10/23/20 0830 10/23/20 0900  BP: (!) 152/66 127/62 (!) 97/51 (!) 130/58  Pulse: 99 100 80 95  Resp: 18 18 17 17   Temp:      TempSrc:      SpO2: 94% 98% 99% 100%  Weight:        Exam: Gen thin frail , elderly WF, less tremulous No jvd or bruits Chest clear bilat  RRR no MRG tachy Abd soft ntnd no mass or ascites +bs Ext no edema Neuro is alert, Ox 2  RIJ TDC intact, no drainage    Home meds:  - norvasc 10 / metoprolol 25 bid/ crestor 20  - effexor xr 37.5 hs/ neurontin 300 hs  - duoneb qid prn  - sitagliptin 25 mg hs  - prn's/ vitamins/ supplements     CXR 1/16 - IMPRESSION: Vague airspace opacity within the peripheral mid lung zone. Recommend repeat PA and lateral view of the chest.   OP [facility]  from 08/2020 > 3.5h  51.5kg  Hep 2000   TDC R IJ    EKG at 2 pm and at 7 pm both show narrow QRS 88-90 msec  Assessment/ Plan: 1. AMS - suspect uremia significant contributor, possibly psych as well 2. Hyperkalemia - EKG w/o sig changes. Given temporizing measures in ED and kayexalate. On HD now.  3. ESRD - HD TTS.  Has missed HD, not sure how many. HD tomorrow as well to get back on schedule.  4. Depression - seen by psych, they don't think she is suicidal  5. HTN - cont meds, no vol excess by exam or CXR 6. Anemia ckd - get records am 7. MBD ckd - cont meds       Cindy Burns 10/23/2020, 9:33 AM   Recent Labs  Lab 10/22/20 1640 10/22/20 1910 10/22/20 1916 10/22/20 2320 10/23/20 0444  K  --    < > 6.0* 4.7 4.6  BUN  --    < >  --  151* 148*  CREATININE  --    < >  --  11.00* 10.86*  CALCIUM  --    < >  --  7.7* 7.5*  PHOS 8.5*  --   --   --   --   HGB  --   --  8.5*  --  9.1*   < > = values in this interval not displayed.   Inpatient medications: . amLODipine  10 mg Oral QHS  . Chlorhexidine  Gluconate Cloth  6 each Topical Q0600  . gabapentin  300 mg Oral QHS  . [START ON 10/24/2020] heparin  2,000 Units Dialysis Once in dialysis  . heparin  5,000 Units Subcutaneous Q12H  . insulin aspart  0-6 Units Subcutaneous TID WC  . metoprolol tartrate  25 mg Oral BID  . rosuvastatin  10 mg Oral QHS  . sodium bicarbonate  1,300 mg Oral TID  . sodium zirconium cyclosilicate  10 g Oral BID  . venlafaxine XR  37.5 mg Oral QHS  . vitamin B-12  1,000 mcg Oral Daily    acetaminophen **OR** acetaminophen, diclofenac Sodium, hydrALAZINE, ipratropium-albuterol, ondansetron **OR** ondansetron (ZOFRAN) IV

## 2020-10-23 NOTE — Progress Notes (Signed)
PROGRESS NOTE    Cindy Burns  DUK:[id number] DOB: [birth year] DOA: 10/22/2020 PCP: [name], MD   Chief Complaint  Patient presents with  . Weakness  Brief Narrative: 75 year old female with ESRD on HD, hypertension, high diabetes mellitus, depression, hyperlipidemia presented with increasing confusion lethargy and hypothermia.  As per the report she has been feeling depressed lately, but she denied any suicidal ideas or what activity. Her last dialysis was Tuesday, then PAST Thursday, she "was feeling great, and thinking I can go without it any longer" and did not go HD. 1/16 morning, she woke  up feeling very tired and sleepy and cold,) a friend who visited her called EMS. She denied any chest pain, no shortness of breath no fever. She still makes urine. In the ED- Uremia BUN 150, bicarb 10, potassium 7.2. Received a cocktail of insulin and D50. EKG showed no significant tented T waves. Nephrology called consulted emergently for dialysis. Patient is admitted she continued to have bouts of lethargy slurred speech.  Subjective:  Patient in dialysis unit. Getting dialysis seen and examined.  She is alert awake having some jocks asterixis, able to tell me her name but did not answer other questions did not follow my commands.  Knees are bent able to move her extremities Overnight events noted  Assessment & Plan:  Acute encephalopathy likely from uremia, metabolic from missed dialysis.  CT head no acute finding.  Hoping it will improve with dialysis if not then may need further work-up.  Severe hyperkalemia status post temporizing measures, undergoing dialysis currently nephrology on board. k improved.  Continue Lokelma per nephrology Recent Labs  Lab 10/22/20 1444 10/22/20 1910 10/22/20 1916 10/22/20 2320 10/23/20 0444  K 7.2* 6.4* 6.0* 4.7 4.6   ESRD on HD TTS-continue dialysis per nephrology.  She missed dialysis and admitted with worsening acidosis hyperkalemia  uremia Recent Labs  Lab 10/22/20 1444 10/22/20 1910 10/22/20 2320 10/23/20 0444  BUN 150* 153* 151* 148*  CREATININE 10.61* 10.97* 11.00* 10.86*   Severe metabolic acidosis in the setting of missed dialysis, continue dialysis, continue sodium bicarb   Hypertension uncontrolled with fluid overload.  Pressures controlled continue amlodipine and metoprolol  DM, hemoglobin A1c normal 4.41 09/06/2020.  Blood sugar is poorly controlled.  Continue sliding scale insulin Recent Labs  Lab 10/22/20 1620 10/22/20 2316  GLUCAP 150* 273*   Lab Results  Component Value Date   HGBA1C 4.4 (L) 09/06/2020    Depression no SI, continue home meds  Asthma/COPD no evidence of exacerbation  Nutrition: Diet Order            Diet renal/carb modified with fluid restriction Diet-HS Snack? Nothing; Fluid restriction: 1200 mL Fluid; Room service appropriate? Yes; Fluid consistency: Thin  Diet effective now                Body mass index is 22.56 kg/m.  DVT prophylaxis: heparin injection 5,000 Units Start: 10/22/20 2000 Code Status:   Code Status: Full Code  Family Communication: plan of care discussed with monitor labs nephrologist.   Status is: Inpatient Remains inpatient appropriate because:Ongoing diagnostic testing needed not appropriate for outpatient work up and Inpatient level of care appropriate due to severity of illness  Dispo: The patient is from: Home              Anticipated d/c is to: SNF              Anticipated d/c date is: 3 days  Patient currently is not medically stable to d/c.  Consultants:see note  Procedures:see note  Culture/Microbiology  Other culture-see note  Medications: Scheduled Meds: . amLODipine  10 mg Oral QHS  . Chlorhexidine Gluconate Cloth  6 each Topical Q0600  . gabapentin  300 mg Oral QHS  . heparin  5,000 Units Subcutaneous Q12H  . insulin aspart  0-6 Units Subcutaneous TID WC  . metoprolol tartrate  25 mg Oral BID  .  rosuvastatin  10 mg Oral QHS  . sodium bicarbonate  1,300 mg Oral TID  . sodium zirconium cyclosilicate  10 g Oral BID  . venlafaxine XR  37.5 mg Oral QHS  . vitamin B-12  1,000 mcg Oral Daily   Continuous Infusions:  Antimicrobials: Anti-infectives (From admission, onward)   None     Objective: Vitals: Today's Vitals   10/23/20 0300 10/23/20 0330 10/23/20 0400 10/23/20 0730  BP: (!) 122/97 113/85 138/72 (!) 153/65  Pulse: (!) 103 99 98 99  Resp: (!) 22 18 16 18   Temp:    97.7 F (36.5 C)  TempSrc:    Oral  SpO2: 100% 99% 99% 98%  Weight:    55.6 kg  PainSc:       No intake or output data in the 24 hours ending 10/23/20 0831 Filed Weights   10/23/20 0730  Weight: 55.6 kg   Weight change:   Intake/Output from previous day: No intake/output data recorded. Intake/Output this shift: No intake/output data recorded. Filed Weights   10/23/20 0730  Weight: 55.6 kg    Examination: General exam: AAOx1 ,NAD, weak appearing. HEENT:Oral mucosa moist, Ear/Nose WNL grossly,dentition normal. Respiratory system: bilaterally clear,no wheezing or crackles,no use of accessory muscle, non tender. Cardiovascular system: S1 & S2 +, regular, No JVD. Gastrointestinal system: Abdomen soft, NT,ND, BS+. Nervous System:Alert, awake, moving extremities and grossly nonfocal Extremities: No edema, distal peripheral pulses palpable.  Skin: No rashes,no icterus. MSK: Normal muscle bulk,tone, power Right chest with HD catheter.   Data Reviewed: I have personally reviewed following labs and imaging studies CBC: Recent Labs  Lab 10/22/20 1444 10/22/20 1916 10/23/20 0444  WBC 6.1  --  5.0  HGB 10.1* 8.5* 9.1*  HCT 31.5* 25.0* 28.2*  MCV 102.3*  --  98.6  PLT 166  --  035   Basic Metabolic Panel: Recent Labs  Lab 10/22/20 1444 10/22/20 1640 10/22/20 1910 10/22/20 1916 10/22/20 2320 10/23/20 0444  NA 136  --  140 139 141 142  K 7.2*  --  6.4* 6.0* 4.7 4.6  CL 105  --  105  --   106 104  CO2 10*  --  14*  --  11* 13*  GLUCOSE 166*  --  103*  --  282* 94  BUN 150*  --  153*  --  151* 148*  CREATININE 10.61*  --  10.97*  --  11.00* 10.86*  CALCIUM 7.9*  --  7.9*  --  7.7* 7.5*  MG  --  2.0  --   --   --   --   PHOS  --  8.5*  --   --   --   --    GFR: Estimated Creatinine Clearance: 3.7 mL/min (A) (by C-G formula based on SCr of 10.86 mg/dL (H)). Liver Function Tests: No results for input(s): AST, ALT, ALKPHOS, BILITOT, PROT, ALBUMIN in the last 168 hours. No results for input(s): LIPASE, AMYLASE in the last 168 hours. No results for input(s): AMMONIA in the last  168 hours. Coagulation Profile: Recent Labs  Lab 10/22/20 1640  INR 1.2   Cardiac Enzymes: No results for input(s): CKTOTAL, CKMB, CKMBINDEX, TROPONINI in the last 168 hours. BNP (last 3 results) No results for input(s): PROBNP in the last 8760 hours. HbA1C: No results for input(s): HGBA1C in the last 72 hours. CBG: Recent Labs  Lab 10/22/20 1620 10/22/20 2316  GLUCAP 150* 273*   Lipid Profile: No results for input(s): CHOL, HDL, LDLCALC, TRIG, CHOLHDL, LDLDIRECT in the last 72 hours. Thyroid Function Tests: No results for input(s): TSH, T4TOTAL, FREET4, T3FREE, THYROIDAB in the last 72 hours. Anemia Panel: No results for input(s): VITAMINB12, FOLATE, FERRITIN, TIBC, IRON, RETICCTPCT in the last 72 hours. Sepsis Labs: Recent Labs  Lab 10/22/20 1638 10/22/20 1838  LATICACIDVEN 0.8 1.3    Recent Results (from the past 240 hour(s))  Resp Panel by RT-PCR (Flu A&B, Covid) Nasopharyngeal Swab     Status: None   Collection Time: 10/22/20  4:22 PM   Specimen: Nasopharyngeal Swab; Nasopharyngeal(NP) swabs in vial transport medium  Result Value Ref Range Status   SARS Coronavirus 2 by RT PCR NEGATIVE NEGATIVE Final    Comment: (NOTE) SARS-CoV-2 target nucleic acids are NOT DETECTED.  The SARS-CoV-2 RNA is generally detectable in upper respiratory specimens during the acute phase of  infection. The lowest concentration of SARS-CoV-2 viral copies this assay can detect is 138 copies/mL. A negative result does not preclude SARS-Cov-2 infection and should not be used as the sole basis for treatment or other patient management decisions. A negative result may occur with  improper specimen collection/handling, submission of specimen other than nasopharyngeal swab, presence of viral mutation(s) within the areas targeted by this assay, and inadequate number of viral copies(<138 copies/mL). A negative result must be combined with clinical observations, patient history, and epidemiological information. The expected result is Negative.  Fact Sheet for Patients:  EntrepreneurPulse.com.au  Fact Sheet for Healthcare Providers:  IncredibleEmployment.be  This test is no t yet approved or cleared by the [place] FDA and  has been authorized for detection and/or diagnosis of SARS-CoV-2 by FDA under an Emergency Use Authorization (EUA). This EUA will remain  in effect (meaning this test can be used) for the duration of the COVID-19 declaration under Section 564(b)(1) of the Act, 21 U.S.C.section 360bbb-3(b)(1), unless the authorization is terminated  or revoked sooner.       Influenza A by PCR NEGATIVE NEGATIVE Final   Influenza B by PCR NEGATIVE NEGATIVE Final    Comment: (NOTE) The Xpert Xpress SARS-CoV-2/FLU/RSV plus assay is intended as an aid in the diagnosis of influenza from Nasopharyngeal swab specimens and should not be used as a sole basis for treatment. Nasal washings and aspirates are unacceptable for Xpert Xpress SARS-CoV-2/FLU/RSV testing.  Fact Sheet for Patients: EntrepreneurPulse.com.au  Fact Sheet for Healthcare Providers: IncredibleEmployment.be  This test is not yet approved or cleared by the [place] FDA and has been authorized for detection and/or diagnosis of SARS-CoV-2  by FDA under an Emergency Use Authorization (EUA). This EUA will remain in effect (meaning this test can be used) for the duration of the COVID-19 declaration under Section 564(b)(1) of the Act, 21 U.S.C. section 360bbb-3(b)(1), unless the authorization is terminated or revoked.  Performed at [facility], [place] [address] [place], [place] [address]      Radiology Studies: CT HEAD WO CONTRAST  Result Date: 10/23/2020 CLINICAL DATA:  Change in mental status EXAM: CT HEAD WITHOUT CONTRAST TECHNIQUE: Contiguous  axial images were obtained from the base of the skull through the vertex without intravenous contrast. COMPARISON:  September 05, 2020 FINDINGS: Brain: No evidence of acute territorial infarction, hemorrhage, hydrocephalus,extra-axial collection or mass lesion/mass effect. There is dilatation the ventricles and sulci consistent with age-related atrophy. Low-attenuation changes in the deep white matter consistent with small vessel ischemia. Again noted is a prior area of encephalomalacia involving the right occipital lobe. Vascular: No hyperdense vessel or unexpected calcification. Skull: The skull is intact. No fracture or focal lesion identified. Sinuses/Orbits: A small amount of fluid is seen within the bilateral sphenoid sinuses. The orbits and globes intact. Other: None IMPRESSION: No acute intracranial abnormality. Findings consistent with age related atrophy and chronic small vessel ischemia Area of encephalomalacia involving the right occipital lobe. Electronically Signed   By: Prudencio Pair M.D.   On: 10/23/2020 00:56   DG Chest Port 1 View  Result Date: 10/22/2020 CLINICAL DATA:  Sepsis, evaluate for abnormality. Refusing dialysis. EXAM: PORTABLE CHEST 1 VIEW COMPARISON:  Chest x-ray 09/05/2020, CT chest 02/11/2014 FINDINGS: Right chest wall dialysis catheter with tip overlying the right atrium. The heart size and mediastinal contours are unchanged. Aortic arch calcifications.  Vague airspace opacity within the peripheral mid lung zone. Chronic increased interstitial markings with no overt pulmonary edema. No pleural effusion. No pneumothorax. No acute osseous abnormality. Known thoracic compression fractures not well visualized. Surgical clips overlie the left axilla. IMPRESSION: Vague airspace opacity within the peripheral mid lung zone. Recommend repeat PA and lateral view of the chest. Electronically Signed   By: Iven Finn M.D.   On: 10/22/2020 16:22     LOS: 1 day   Antonieta Pert, MD [facility]  10/23/2020, 8:31 AM

## 2020-10-23 NOTE — Progress Notes (Signed)
Inpatient Diabetes Program Recommendations  AACE/ADA: New Consensus Statement on Inpatient Glycemic Control (2015)  Target Ranges:  Prepandial:   less than 140 mg/dL      Peak postprandial:   less than 180 mg/dL (1-2 hours)      Critically ill patients:  140 - 180 mg/dL   Lab Results  Component Value Date   GLUCAP 93 10/23/2020   HGBA1C 4.4 (L) 09/06/2020    Review of Glycemic Control Results for ESHA, FINCHER (MRN [id number]) as of 10/23/2020 13:04  Ref. Range 09/06/2020 07:53 09/06/2020 08:29 10/22/2020 16:20 10/22/2020 23:16 10/23/2020 12:32  Glucose-Capillary Latest Ref Range: 70 - 99 mg/dL 56 (L) 93 150 (H) 273 (H) 93   Diabetes history: DM2 Outpatient Diabetes medications: Januvia 25 mg qd Current orders for Inpatient glycemic control: Novolog 0-6 units tid  Inpatient Diabetes Program Recommendations:   Noted A1c 4.4 on 12/21. Patient may not need Januvia on discharge.  Thank you, Nani Gasser. Sharolyn Weber, RN, MSN, CDE  Diabetes Coordinator Inpatient Glycemic Control Team Team Pager [phone] (8am-5pm) 10/23/2020 1:07 PM

## 2020-10-23 NOTE — ED Notes (Signed)
Dr. Jacki Cones at bedside

## 2020-10-23 NOTE — ED Notes (Signed)
Breakfast Ordered 

## 2020-10-23 NOTE — Progress Notes (Signed)
HOSPITAL MEDICINE OVERNIGHT EVENT NOTE    Notified by nursing that patient has been exhibiting bouts of slurred speech intermittently throughout the evening.  Stat CT head obtained revealing no evidence of acute intracranial abnormality.  I went to evaluate the patient at the bedside.  Patient is continually exhibiting bouts of lethargy followed by bouts of agitation.  Patient does intermittently exhibit difficulty with word finding and slurred speech throughout the interview.  That being said, patient exhibits no evidence of facial droop or focal weakness on neurologic exam.  Chart reviewed, patient is being hospitalized for significant uremia and I believe that the patient's current symptoms are secondary to uremic encephalopathy and not secondary to an acute cerebrovascular event.  Continue to monitor patient closely, patient is to go for hemodialysis in the morning on 1/17.  Vernelle Emerald  MD [facility]

## 2020-10-23 NOTE — ED Notes (Addendum)
Floor coverage MD paged d/t pt becoming more altered. Pt's speech is also becoming slurred. Pt appears to be more lethargic. No Unilateral weakness noticeable and/or facial droop.

## 2020-10-23 NOTE — ED Notes (Signed)
Ebony RN called and report given

## 2020-10-24 LAB — CBC
HCT: 26.1 % — ABNORMAL LOW (ref 36.0–46.0)
Hemoglobin: 8.4 g/dL — ABNORMAL LOW (ref 12.0–15.0)
MCH: 31.6 pg (ref 26.0–34.0)
MCHC: 32.2 g/dL (ref 30.0–36.0)
MCV: 98.1 fL (ref 80.0–100.0)
Platelets: 139 10*3/uL — ABNORMAL LOW (ref 150–400)
RBC: 2.66 MIL/uL — ABNORMAL LOW (ref 3.87–5.11)
RDW: 14.7 % (ref 11.5–15.5)
WBC: 3.8 10*3/uL — ABNORMAL LOW (ref 4.0–10.5)
nRBC: 0 % (ref 0.0–0.2)

## 2020-10-24 LAB — BASIC METABOLIC PANEL
Anion gap: 16 — ABNORMAL HIGH (ref 5–15)
BUN: 40 mg/dL — ABNORMAL HIGH (ref 8–23)
CO2: 25 mmol/L (ref 22–32)
Calcium: 7.3 mg/dL — ABNORMAL LOW (ref 8.9–10.3)
Chloride: 98 mmol/L (ref 98–111)
Creatinine, Ser: 5.49 mg/dL — ABNORMAL HIGH (ref 0.44–1.00)
GFR, Estimated: 8 mL/min — ABNORMAL LOW (ref >60)
Glucose, Bld: 96 mg/dL (ref 70–99)
Potassium: 3 mmol/L — ABNORMAL LOW (ref 3.5–5.1)
Sodium: 139 mmol/L (ref 135–145)

## 2020-10-24 LAB — HEPATIC FUNCTION PANEL
ALT: 14 U/L (ref 0–44)
AST: 24 U/L (ref 15–41)
Albumin: 3 g/dL — ABNORMAL LOW (ref 3.5–5.0)
Alkaline Phosphatase: 58 U/L (ref 38–126)
Bilirubin, Direct: <0.1 mg/dL (ref 0.0–0.2)
Total Bilirubin: 0.9 mg/dL (ref 0.3–1.2)
Total Protein: 5.8 g/dL — ABNORMAL LOW (ref 6.5–8.1)

## 2020-10-24 LAB — GLUCOSE, CAPILLARY
Glucose-Capillary: 84 mg/dL (ref 70–99)
Glucose-Capillary: 99 mg/dL (ref 70–99)
Glucose-Capillary: 95 mg/dL (ref 70–99)
Glucose-Capillary: 104 mg/dL — ABNORMAL HIGH (ref 70–99)

## 2020-10-24 MED ORDER — DOXERCALCIFEROL 4 MCG/2ML IV SOLN
1.0000 ug | INTRAVENOUS | Status: DC
  Administered 2020-10-25: 1 ug via INTRAVENOUS

## 2020-10-24 MED ORDER — SEVELAMER CARBONATE 800 MG PO TABS
1600.0000 mg | ORAL_TABLET | Freq: Three times a day (TID) | ORAL | Status: DC
  Administered 2020-10-24 – 2020-10-25 (×2): 1600 mg via ORAL
  Filled 2020-10-24 (×3): qty 2

## 2020-10-24 MED ORDER — DARBEPOETIN ALFA 60 MCG/0.3ML IJ SOSY
60.0000 ug | PREFILLED_SYRINGE | INTRAMUSCULAR | Status: DC
  Administered 2020-10-25: 60 ug via INTRAVENOUS

## 2020-10-24 NOTE — Evaluation (Signed)
**Note Cindy-Identified via Obfuscation** Physical Therapy Evaluation Patient Details Name: Cindy Burns MRN: [id number] DOB: [birth year] Today's Date: 10/24/2020   History of Present Illness  75 year old female with ESRD on HD, hypertension, high diabetes mellitus, depression, hyperlipidemia presented with increasing confusion lethargy and hypothermia. Admitted with acute encephalopathy likely from uremia, severe hyperkalemia and severe metabolic acidosis in setting of missed dialysis.  Clinical Impression  Prior to admission, pt lives alone in a mobile home, uses a cane for mobility, and is independent with ADL's. Pt uses "[employer]," for transportation to dialysis. Pt now demonstrating improved cognition, A&Ox4. Reports she skipped dialysis because it's "boring," but also acknowledging that missing it is what caused her to be hospitalized. On PT evaluation, pt ambulating 100 feet with a walker at a min guard assist level. Demonstrates mild dynamic balance impairments. Does endorse history of recent falls. Recommending HHPT to address deficits and maximize functional independence.     Follow Up Recommendations Home health PT (declining SNF)    Equipment Recommendations  None recommended by PT    Recommendations for Other Services       Precautions / Restrictions Precautions Precautions: Fall Restrictions Weight Bearing Restrictions: No      Mobility  Bed Mobility Overal bed mobility: Independent                  Transfers Overall transfer level: Needs assistance Equipment used: Rolling walker (2 wheeled) Transfers: Sit to/from Stand Sit to Stand: Supervision            Ambulation/Gait Ambulation/Gait assistance: Min guard Gait Distance (Feet): 100 Feet Assistive device: Rolling walker (2 wheeled) Gait Pattern/deviations: Step-through pattern;Decreased stride length;Antalgic Gait velocity: decreased   General Gait Details: Mildly antalgic gait pattern towards end of walk, min guard for safety, no  overt LOB  Stairs            Wheelchair Mobility    Modified Rankin (Stroke Patients Only)       Balance Overall balance assessment: Needs assistance Sitting-balance support: Feet supported Sitting balance-Leahy Scale: Good     Standing balance support: Bilateral upper extremity supported Standing balance-Leahy Scale: Poor Standing balance comment: reliant on external support                             Pertinent Vitals/Pain Pain Assessment: No/denies pain    Home Living Family/patient expects to be discharged to:: Private residence Living Arrangements: Alone Available Help at Discharge: Friend(s);Available PRN/intermittently Type of Home: Mobile home Home Access: Stairs to enter   Entrance Stairs-Number of Steps: 3  Home Layout: One level Home Equipment: Cane - single point;Walker - 4 wheels      Prior Function Level of Independence: Needs assistance   Gait / Transfers Assistance Needed: using cane  ADL's / Homemaking Assistance Needed: independent ADL's, drives to grocery store, [employer] transportation to dialysis        Hand Dominance   Dominant Hand: Right    Extremity/Trunk Assessment   Upper Extremity Assessment Upper Extremity Assessment: RUE deficits/detail;LUE deficits/detail RUE Coordination: decreased gross motor LUE Coordination: decreased gross motor    Lower Extremity Assessment Lower Extremity Assessment: Overall WFL for tasks assessed       Communication   Communication: No difficulties  Cognition Arousal/Alertness: Awake/alert Behavior During Therapy: WFL for tasks assessed/performed Overall Cognitive Status: Impaired/Different from baseline Area of Impairment: Safety/judgement;Awareness  Safety/Judgement: Decreased awareness of deficits Awareness: Emergent   General Comments: Pt now A&Ox4, reporting she did not go to dialysis because it's "boring," but now recognizing that  missing dialysis caused her to be hospitalized      General Comments      Exercises     Assessment/Plan    PT Assessment Patient needs continued PT services  PT Problem List Decreased strength;Decreased activity tolerance;Decreased mobility;Decreased balance;Decreased safety awareness       PT Treatment Interventions DME instruction;Gait training;Stair training;Functional mobility training;Therapeutic activities;Therapeutic exercise;Balance training;Patient/family education    PT Goals (Current goals can be found in the Care Plan section)  Acute Rehab PT Goals Patient Stated Goal: go home PT Goal Formulation: With patient Time For Goal Achievement: 11/07/20 Potential to Achieve Goals: Good    Frequency Min 3X/week   Barriers to discharge        Co-evaluation               AM-PAC PT "6 Clicks" Mobility  Outcome Measure Help needed turning from your back to your side while in a flat bed without using bedrails?: None Help needed moving from lying on your back to sitting on the side of a flat bed without using bedrails?: None Help needed moving to and from a bed to a chair (including a wheelchair)?: A Little Help needed standing up from a chair using your arms (e.g., wheelchair or bedside chair)?: None Help needed to walk in hospital room?: A Little Help needed climbing 3-5 steps with a railing? : A Little 6 Click Score: 21    End of Session Equipment Utilized During Treatment: Gait belt Activity Tolerance: Patient tolerated treatment well Patient left: in chair;with call bell/phone within reach;with chair alarm set Nurse Communication: Mobility status PT Visit Diagnosis: Unsteadiness on feet (R26.81);History of falling (Z91.81)    Time: 4975-3005 PT Time Calculation (min) (ACUTE ONLY): 20 min   Charges:   PT Evaluation $PT Eval Moderate Complexity: 1 Mod          Wyona Almas, PT, DPT Acute Rehabilitation Services Pager [phone] Office  [phone]   Deno Etienne 10/24/2020, 11:17 AM

## 2020-10-24 NOTE — Progress Notes (Signed)
PROGRESS NOTE    Cindy Burns  RFF:[id number] DOB: 1946/03/16 DOA: 10/22/2020 PCP: [name], MD   Chief Complaint  Patient presents with  . Weakness  Brief Narrative: 75 year old female with ESRD on HD, hypertension, high diabetes mellitus, depression, hyperlipidemia presented with increasing confusion lethargy and hypothermia. As per the report she has been feeling depressed lately, but she denied any suicidal ideas or what activity. Her last dialysis was Tuesday, then PAST Thursday, she "was feeling great, and thinking I can go without it any longer" and did not go HD. 1/16 morning, she woke  up feeling very tired and sleepy and cold,) a friend who visited her called EMS. She denied any chest pain, no shortness of breath no fever. She still makes urine. In the ED- Uremia BUN 150, bicarb 10, potassium 7.2. Received a cocktail of insulin and D50. EKG showed no significant tented T waves. Nephrology called consulted emergently for dialysis and admitted.   Subjective: Seen this morning.  Alert awake x3 back to baseline.  She states she missed dialysis x4.She did not feel like going to dialysis but she does want to go through dialysis and understands if she does not get dialysis she may die. Afebrile overnight.  Saturating well on room air.  Assessment & Plan:  Acute encephalopathy likely from uremia/metabolic from missed dialysis sessions.  She is alert awake oriented x3.  CT head no acute finding.  Continue dialysis per nephrology continue pulm precautions, supportive care. If mentation dose improve soon-may need further work-up.  Severe hyperkalemia: Resolved with dialysis, lokelma, and temporizing measures. Recent Labs  Lab 10/22/20 1444 10/22/20 1910 10/22/20 1916 10/22/20 2320 10/23/20 0444  K 7.2* 6.4* 6.0* 4.7 4.6   ESRD on HD TTS: Missed dialysis multiple times, and admitted with hyperkalemia acidosis.  S/p HD 1/17. Continue HD per nephro. She does want to continue with  HD, not suicidal.  Extensively counseled for compliance. Recent Labs  Lab 10/22/20 1444 10/22/20 1910 10/22/20 2320 10/23/20 0444  BUN 150* 153* 151* 148*  CREATININE 10.61* 10.97* 11.00* 10.86*   Severe metabolic acidosis in the setting of ESRD missed dialyses.  Resolved.  Continue sodium bicarb.  Hypertension BP is controlled. Cont amlodipine and metoprolol.  JT:TSV7B  normal 4.41 09/06/2020.  Blood sugar is fluctuating 80s to 270s, cont ssi. Recent Labs  Lab 10/22/20 1620 10/22/20 2316 10/23/20 1232 10/23/20 1846 10/24/20 0732  GLUCAP 150* 273* 93 137* 84   Lab Results  Component Value Date   HGBA1C 4.4 (L) 09/06/2020   Depression no SI, continue home meds  Asthma/COPD no evidence of exacerbation, respiratory status stable on room air.  Nutrition: Diet Order            Diet renal/carb modified with fluid restriction Diet-HS Snack? Nothing; Fluid restriction: 1200 mL Fluid; Room service appropriate? Yes; Fluid consistency: Thin  Diet effective now                Body mass index is 20.19 kg/m.  DVT prophylaxis: heparin injection 5,000 Units Start: 10/22/20 2000 Code Status:   Code Status: Full Code  Family Communication: plan of care discussed with monitor labs nephrologist.   Status is: Inpatient Remains inpatient appropriate because:Ongoing diagnostic testing needed not appropriate for outpatient work up and Inpatient level of care appropriate due to severity of illness  Dispo: The patient is from: Home              Anticipated d/c is to: SNF.  PT  OT evaluation requested today for disposition               Anticipated d/c date is tomorrow if okay with nephrology              Patient currently is not medically stable to d/c.  Consultants:see note  Procedures:see note  Culture/Microbiology  Other culture-see note  Medications: Scheduled Meds: . amLODipine  10 mg Oral QHS  . Chlorhexidine Gluconate Cloth  6 each Topical Q0600  . gabapentin  300 mg  Oral QHS  . heparin  5,000 Units Subcutaneous Q12H  . insulin aspart  0-6 Units Subcutaneous TID WC  . metoprolol tartrate  25 mg Oral BID  . rosuvastatin  10 mg Oral QHS  . sodium bicarbonate  1,300 mg Oral TID  . venlafaxine XR  37.5 mg Oral QHS  . vitamin B-12  1,000 mcg Oral Daily   Continuous Infusions:  Antimicrobials: Anti-infectives (From admission, onward)   None     Objective: Vitals: Today's Vitals   10/23/20 1200 10/23/20 1524 10/23/20 1627 10/24/20 0631  BP: (!) 162/81 (!) 159/97 (!) 166/66 (!) 135/51  Pulse: (!) 103 (!) 103 88 79  Resp: 18  17 (!) 9  Temp:   98.1 F (36.7 C) (!) 97 F (36.1 C)  TempSrc:   Oral Oral  SpO2: 95%  95% 97%  Weight:    49.8 kg  PainSc:   0-No pain 0-No pain    Intake/Output Summary (Last 24 hours) at 10/24/2020 0807 Last data filed at 10/24/2020 0600 Gross per 24 hour  Intake 240 ml  Output 700 ml  Net -460 ml   Filed Weights   10/23/20 0730 10/23/20 1035 10/24/20 0631  Weight: 55.6 kg 55 kg 49.8 kg   Weight change:   Intake/Output from previous day: 01/17 0701 - 01/18 0700 In: 240 [P.O.:240] Out: 700  Intake/Output this shift: No intake/output data recorded. Filed Weights   10/23/20 0730 10/23/20 1035 10/24/20 0631  Weight: 55.6 kg 55 kg 49.8 kg    Examination: General exam: AAOx3, OLD for her age , NAD, weak appearing. HEENT:Oral mucosa moist, Ear/Nose WNL grossly, dentition normal. Respiratory system: bilaterally clear,no wheezing or crackles,no use of accessory muscle, rt chest with HD catheter dressing intact clean dry. Cardiovascular system: S1 & S2 +, No JVD,. Gastrointestinal system: Abdomen soft, NT,ND, BS+ Nervous System:Alert, awake, moving extremities and grossly nonfocal Extremities: No edema, distal peripheral pulses palpable.  Skin: No rashes,no icterus. MSK: Normal muscle bulk,tone, power.  Data Reviewed: I have personally reviewed following labs and imaging studies CBC: Recent Labs  Lab  10/22/20 1444 10/22/20 1916 10/23/20 0444 10/24/20 0650  WBC 6.1  --  5.0 3.8*  HGB 10.1* 8.5* 9.1* 8.4*  HCT 31.5* 25.0* 28.2* 26.1*  MCV 102.3*  --  98.6 98.1  PLT 166  --  177 301*   Basic Metabolic Panel: Recent Labs  Lab 10/22/20 1444 10/22/20 1640 10/22/20 1910 10/22/20 1916 10/22/20 2320 10/23/20 0444  NA 136  --  140 139 141 142  K 7.2*  --  6.4* 6.0* 4.7 4.6  CL 105  --  105  --  106 104  CO2 10*  --  14*  --  11* 13*  GLUCOSE 166*  --  103*  --  282* 94  BUN 150*  --  153*  --  151* 148*  CREATININE 10.61*  --  10.97*  --  11.00* 10.86*  CALCIUM 7.9*  --  7.9*  --  7.7* 7.5*  MG  --  2.0  --   --   --   --   PHOS  --  8.5*  --   --   --   --    GFR: Estimated Creatinine Clearance: 3.7 mL/min (A) (by C-G formula based on SCr of 10.86 mg/dL (H)). Liver Function Tests: No results for input(s): AST, ALT, ALKPHOS, BILITOT, PROT, ALBUMIN in the last 168 hours. No results for input(s): LIPASE, AMYLASE in the last 168 hours. No results for input(s): AMMONIA in the last 168 hours. Coagulation Profile: Recent Labs  Lab 10/22/20 1640  INR 1.2   Cardiac Enzymes: No results for input(s): CKTOTAL, CKMB, CKMBINDEX, TROPONINI in the last 168 hours. BNP (last 3 results) No results for input(s): PROBNP in the last 8760 hours. HbA1C: No results for input(s): HGBA1C in the last 72 hours. CBG: Recent Labs  Lab 10/22/20 1620 10/22/20 2316 10/23/20 1232 10/23/20 1846 10/24/20 0732  GLUCAP 150* 273* 93 137* 84   Lipid Profile: No results for input(s): CHOL, HDL, LDLCALC, TRIG, CHOLHDL, LDLDIRECT in the last 72 hours. Thyroid Function Tests: No results for input(s): TSH, T4TOTAL, FREET4, T3FREE, THYROIDAB in the last 72 hours. Anemia Panel: No results for input(s): VITAMINB12, FOLATE, FERRITIN, TIBC, IRON, RETICCTPCT in the last 72 hours. Sepsis Labs: Recent Labs  Lab 10/22/20 1638 10/22/20 1838  LATICACIDVEN 0.8 1.3    Recent Results (from the past 240  hour(s))  Blood culture (routine single)     Status: None (Preliminary result)   Collection Time: 10/22/20  4:06 PM   Specimen: BLOOD RIGHT FOREARM  Result Value Ref Range Status   Specimen Description BLOOD RIGHT FOREARM  Final   Special Requests   Final    BOTTLES DRAWN AEROBIC AND ANAEROBIC Blood Culture results may not be optimal due to an inadequate volume of blood received in culture bottles   Culture   Final    NO GROWTH < 24 HOURS Performed at [facility], [place] [address] [place],  [address]    Report Status PENDING  Incomplete  Resp Panel by RT-PCR (Flu A&B, Covid) Nasopharyngeal Swab     Status: None   Collection Time: 10/22/20  4:22 PM   Specimen: Nasopharyngeal Swab; Nasopharyngeal(NP) swabs in vial transport medium  Result Value Ref Range Status   SARS Coronavirus 2 by RT PCR NEGATIVE NEGATIVE Final    Comment: (NOTE) SARS-CoV-2 target nucleic acids are NOT DETECTED.  The SARS-CoV-2 RNA is generally detectable in upper respiratory specimens during the acute phase of infection. The lowest concentration of SARS-CoV-2 viral copies this assay can detect is 138 copies/mL. A negative result does not preclude SARS-Cov-2 infection and should not be used as the sole basis for treatment or other patient management decisions. A negative result may occur with  improper specimen collection/handling, submission of specimen other than nasopharyngeal swab, presence of viral mutation(s) within the areas targeted by this assay, and inadequate number of viral copies(<138 copies/mL). A negative result must be combined with clinical observations, patient history, and epidemiological information. The expected result is Negative.  Fact Sheet for Patients:  EntrepreneurPulse.com.au  Fact Sheet for Healthcare Providers:  IncredibleEmployment.be  This test is no t yet approved or cleared by the [place] FDA and  has been authorized for  detection and/or diagnosis of SARS-CoV-2 by FDA under an Emergency Use Authorization (EUA). This EUA will remain  in effect (meaning this test can be used) for  the duration of the COVID-19 declaration under Section 564(b)(1) of the Act, 21 U.S.C.section 360bbb-3(b)(1), unless the authorization is terminated  or revoked sooner.       Influenza A by PCR NEGATIVE NEGATIVE Final   Influenza B by PCR NEGATIVE NEGATIVE Final    Comment: (NOTE) The Xpert Xpress SARS-CoV-2/FLU/RSV plus assay is intended as an aid in the diagnosis of influenza from Nasopharyngeal swab specimens and should not be used as a sole basis for treatment. Nasal washings and aspirates are unacceptable for Xpert Xpress SARS-CoV-2/FLU/RSV testing.  Fact Sheet for Patients: EntrepreneurPulse.com.au  Fact Sheet for Healthcare Providers: IncredibleEmployment.be  This test is not yet approved or cleared by the [place] FDA and has been authorized for detection and/or diagnosis of SARS-CoV-2 by FDA under an Emergency Use Authorization (EUA). This EUA will remain in effect (meaning this test can be used) for the duration of the COVID-19 declaration under Section 564(b)(1) of the Act, 21 U.S.C. section 360bbb-3(b)(1), unless the authorization is terminated or revoked.  Performed at [facility], [place] [address] [place], [place] [address]      Radiology Studies: CT HEAD WO CONTRAST  Result Date: 10/23/2020 CLINICAL DATA:  Change in mental status EXAM: CT HEAD WITHOUT CONTRAST TECHNIQUE: Contiguous axial images were obtained from the base of the skull through the vertex without intravenous contrast. COMPARISON:  September 05, 2020 FINDINGS: Brain: No evidence of acute territorial infarction, hemorrhage, hydrocephalus,extra-axial collection or mass lesion/mass effect. There is dilatation the ventricles and sulci consistent with age-related atrophy. Low-attenuation changes in the  deep white matter consistent with small vessel ischemia. Again noted is a prior area of encephalomalacia involving the right occipital lobe. Vascular: No hyperdense vessel or unexpected calcification. Skull: The skull is intact. No fracture or focal lesion identified. Sinuses/Orbits: A small amount of fluid is seen within the bilateral sphenoid sinuses. The orbits and globes intact. Other: None IMPRESSION: No acute intracranial abnormality. Findings consistent with age related atrophy and chronic small vessel ischemia Area of encephalomalacia involving the right occipital lobe. Electronically Signed   By: Prudencio Pair M.D.   On: 10/23/2020 00:56   DG Chest Port 1 View  Result Date: 10/22/2020 CLINICAL DATA:  Sepsis, evaluate for abnormality. Refusing dialysis. EXAM: PORTABLE CHEST 1 VIEW COMPARISON:  Chest x-ray 09/05/2020, CT chest 02/11/2014 FINDINGS: Right chest wall dialysis catheter with tip overlying the right atrium. The heart size and mediastinal contours are unchanged. Aortic arch calcifications. Vague airspace opacity within the peripheral mid lung zone. Chronic increased interstitial markings with no overt pulmonary edema. No pleural effusion. No pneumothorax. No acute osseous abnormality. Known thoracic compression fractures not well visualized. Surgical clips overlie the left axilla. IMPRESSION: Vague airspace opacity within the peripheral mid lung zone. Recommend repeat PA and lateral view of the chest. Electronically Signed   By: Iven Finn M.D.   On: 10/22/2020 16:22     LOS: 2 days   Antonieta Pert, MD [facility]  10/24/2020, 8:07 AM

## 2020-10-24 NOTE — Progress Notes (Signed)
[facility] Progress Note   Subjective:   Patient seen and examined at bedside in room.  Admits to weakness.  Denies CP, SOB, abdominal pain, n/v/d, and fatigue. Wants to continue dialysis for now.  Discussed importance of regular dialysis attendance.   Objective Vitals:   10/23/20 1200 10/23/20 1524 10/23/20 1627 10/24/20 0631  BP: (!) 162/81 (!) 159/97 (!) 166/66 (!) 135/51  Pulse: (!) 103 (!) 103 88 79  Resp: 18  17 (!) 9  Temp:   98.1 F (36.7 C) (!) 97 F (36.1 C)  TempSrc:   Oral Oral  SpO2: 95%  95% 97%  Weight:    49.8 kg   Physical Exam General:Alert, thin, frail, elderly female in NAD Heart:+tachycardia, no mrg Lungs:CTAB, nml WOB Abdomen:soft, NTND Extremities:no LE edema Dialysis Access: R IJ [facility] c/d/i   [facility] Weights   10/23/20 0730 10/23/20 1035 10/24/20 0631  Weight: 55.6 kg 55 kg 49.8 kg    Intake/Output Summary (Last 24 hours) at 10/24/2020 1210 Last data filed at 10/24/2020 0846 Gross per 24 hour  Intake 477 ml  Output 0 ml  Net 477 ml    Additional Objective Labs: Basic Metabolic Panel: Recent Labs  Lab 10/22/20 1640 10/22/20 1910 10/22/20 2320 10/23/20 0444 10/24/20 0650  NA  --    < > 141 142 139  K  --    < > 4.7 4.6 3.0*  CL  --    < > 106 104 98  CO2  --    < > 11* 13* 25  GLUCOSE  --    < > 282* 94 96  BUN  --    < > 151* 148* 40*  CREATININE  --    < > 11.00* 10.86* 5.49*  CALCIUM  --    < > 7.7* 7.5* 7.3*  PHOS 8.5*  --   --   --   --    < > = values in this interval not displayed.   Liver Function Tests: Recent Labs  Lab 10/24/20 0650  AST 24  ALT 14  ALKPHOS 58  BILITOT 0.9  PROT 5.8*  ALBUMIN 3.0*   CBC: Recent Labs  Lab 10/22/20 1444 10/22/20 1916 10/23/20 0444 10/24/20 0650  WBC 6.1  --  5.0 3.8*  HGB 10.1* 8.5* 9.1* 8.4*  HCT 31.5* 25.0* 28.2* 26.1*  MCV 102.3*  --  98.6 98.1  PLT 166  --  177 139*   Blood Culture    Component Value Date/Time   SDES BLOOD RIGHT FOREARM 10/22/2020  1606   SPECREQUEST  10/22/2020 1606    BOTTLES DRAWN AEROBIC AND ANAEROBIC Blood Culture results may not be optimal due to an inadequate volume of blood received in culture bottles   CULT  10/22/2020 1606    NO GROWTH 2 DAYS Performed at [facility] [address], [place], [place] [address]    REPTSTATUS PENDING 10/22/2020 1606   CBG: Recent Labs  Lab 10/22/20 1620 10/22/20 2316 10/23/20 1232 10/23/20 1846 10/24/20 0732  GLUCAP 150* 273* 93 137* 84   Studies/Results: CT HEAD WO CONTRAST  Result Date: 10/23/2020 CLINICAL DATA:  Change in mental status EXAM: CT HEAD WITHOUT CONTRAST TECHNIQUE: Contiguous axial images were obtained from the base of the skull through the vertex without intravenous contrast. COMPARISON:  September 05, 2020 FINDINGS: Brain: No evidence of acute territorial infarction, hemorrhage, hydrocephalus,extra-axial collection or mass lesion/mass effect. There is dilatation the ventricles and sulci consistent with age-related  atrophy. Low-attenuation changes in the deep white matter consistent with small vessel ischemia. Again noted is a prior area of encephalomalacia involving the right occipital lobe. Vascular: No hyperdense vessel or unexpected calcification. Skull: The skull is intact. No fracture or focal lesion identified. Sinuses/Orbits: A small amount of fluid is seen within the bilateral sphenoid sinuses. The orbits and globes intact. Other: None IMPRESSION: No acute intracranial abnormality. Findings consistent with age related atrophy and chronic small vessel ischemia Area of encephalomalacia involving the right occipital lobe. Electronically Signed   By: Prudencio Pair M.D.   On: 10/23/2020 00:56   DG Chest Port 1 View  Result Date: 10/22/2020 CLINICAL DATA:  Sepsis, evaluate for abnormality. Refusing dialysis. EXAM: PORTABLE CHEST 1 VIEW COMPARISON:  Chest x-ray 09/05/2020, CT chest 02/11/2014 FINDINGS: Right chest wall dialysis catheter with tip  overlying the right atrium. The heart size and mediastinal contours are unchanged. Aortic arch calcifications. Vague airspace opacity within the peripheral mid lung zone. Chronic increased interstitial markings with no overt pulmonary edema. No pleural effusion. No pneumothorax. No acute osseous abnormality. Known thoracic compression fractures not well visualized. Surgical clips overlie the left axilla. IMPRESSION: Vague airspace opacity within the peripheral mid lung zone. Recommend repeat PA and lateral view of the chest. Electronically Signed   By: Iven Finn M.D.   On: 10/22/2020 16:22    Medications:  . amLODipine  10 mg Oral QHS  . Chlorhexidine Gluconate Cloth  6 each Topical Q0600  . gabapentin  300 mg Oral QHS  . heparin  5,000 Units Subcutaneous Q12H  . insulin aspart  0-6 Units Subcutaneous TID WC  . metoprolol tartrate  25 mg Oral BID  . rosuvastatin  10 mg Oral QHS  . sodium bicarbonate  1,300 mg Oral TID  . venlafaxine XR  37.5 mg Oral QHS  . vitamin B-12  1,000 mcg Oral Daily    Dialysis Orders: [facility] 3.5hrs 450/AF 2.0 edw 50kg 2K 2Ca TDC Hep 2000 Hectorol 1  Venofer 50mg  qwk mircera 37mcg q2wks - last 80mcg on 12/7   Home meds: - norvasc 10 / metoprolol 25 bid/ crestor 20 - effexor xr 37.5 hs/ neurontin 300 hs - duoneb qid prn - sitagliptin 25 mg hs - prn's/ vitamins/ supplements  Assessment/ Plan: 1. AMS - improved. Likely d/t uremia, possibly psych component as well 2. Hyperkalemia - 2/2 missed HD.  Resolved post HD. K 3.0 today.  3. ESRD - HD TTS. Missed multiple HD.  HD yesterday off schedule, will remain off schedule tomorrow and try to get back on schedule later this week. Wants to continue HD.  4. Depression - seen by psych, they don't think she is suicidal  5. HTN - BP close to goal, cont meds, no vol excess by exam or CXR.  UF as tolerated.  6. Anemia ckd - Hgb 8.4. Orders written for aranesp 63mcg qwk 7. MBD ckd - Ca in goal, last  phos 8.5. Continue VDRA, binders.  8. Nutrition - renal diet w/fluid restrictions 9. DMT2 - per primary   Jen Mow, PA-C [place] 10/24/2020,12:10 PM  LOS: 2 days

## 2020-10-25 DIAGNOSIS — N186 End stage renal disease: Secondary | ICD-10-CM

## 2020-10-25 DIAGNOSIS — E118 Type 2 diabetes mellitus with unspecified complications: Secondary | ICD-10-CM

## 2020-10-25 DIAGNOSIS — G9341 Metabolic encephalopathy: Secondary | ICD-10-CM

## 2020-10-25 DIAGNOSIS — E872 Acidosis: Secondary | ICD-10-CM

## 2020-10-25 LAB — BASIC METABOLIC PANEL
Anion gap: 15 (ref 5–15)
Anion gap: 12 (ref 5–15)
BUN: 50 mg/dL — ABNORMAL HIGH (ref 8–23)
BUN: 12 mg/dL (ref 8–23)
CO2: 26 mmol/L (ref 22–32)
CO2: 27 mmol/L (ref 22–32)
Calcium: 6.9 mg/dL — ABNORMAL LOW (ref 8.9–10.3)
Calcium: 7.9 mg/dL — ABNORMAL LOW (ref 8.9–10.3)
Chloride: 97 mmol/L — ABNORMAL LOW (ref 98–111)
Chloride: 100 mmol/L (ref 98–111)
Creatinine, Ser: 6.83 mg/dL — ABNORMAL HIGH (ref 0.44–1.00)
Creatinine, Ser: 2.94 mg/dL — ABNORMAL HIGH (ref 0.44–1.00)
GFR, Estimated: 6 mL/min — ABNORMAL LOW (ref >60)
GFR, Estimated: 16 mL/min — ABNORMAL LOW (ref >60)
Glucose, Bld: 86 mg/dL (ref 70–99)
Glucose, Bld: 105 mg/dL — ABNORMAL HIGH (ref 70–99)
Potassium: 2.7 mmol/L — CL (ref 3.5–5.1)
Potassium: 3.7 mmol/L (ref 3.5–5.1)
Sodium: 138 mmol/L (ref 135–145)
Sodium: 139 mmol/L (ref 135–145)

## 2020-10-25 LAB — CBC
HCT: 24.3 % — ABNORMAL LOW (ref 36.0–46.0)
Hemoglobin: 8.1 g/dL — ABNORMAL LOW (ref 12.0–15.0)
MCH: 32.1 pg (ref 26.0–34.0)
MCHC: 33.3 g/dL (ref 30.0–36.0)
MCV: 96.4 fL (ref 80.0–100.0)
Platelets: 135 10*3/uL — ABNORMAL LOW (ref 150–400)
RBC: 2.52 MIL/uL — ABNORMAL LOW (ref 3.87–5.11)
RDW: 13.8 % (ref 11.5–15.5)
WBC: 3.6 10*3/uL — ABNORMAL LOW (ref 4.0–10.5)
nRBC: 0 % (ref 0.0–0.2)

## 2020-10-25 LAB — HEPATITIS B SURFACE ANTIGEN: Hepatitis B Surface Ag: NONREACTIVE

## 2020-10-25 LAB — GLUCOSE, CAPILLARY
Glucose-Capillary: 89 mg/dL (ref 70–99)
Glucose-Capillary: 72 mg/dL (ref 70–99)

## 2020-10-25 MED ORDER — DARBEPOETIN ALFA 60 MCG/0.3ML IJ SOSY
PREFILLED_SYRINGE | INTRAMUSCULAR | Status: AC
  Filled 2020-10-25: qty 0.3

## 2020-10-25 MED ORDER — HEPARIN SODIUM (PORCINE) 1000 UNIT/ML IJ SOLN
INTRAMUSCULAR | Status: AC
  Administered 2020-10-25: 1000 [IU]
  Filled 2020-10-25: qty 4

## 2020-10-25 MED ORDER — DOXERCALCIFEROL 4 MCG/2ML IV SOLN
INTRAVENOUS | Status: AC
  Filled 2020-10-25: qty 2

## 2020-10-25 MED ORDER — SODIUM BICARBONATE 650 MG PO TABS
1300.0000 mg | ORAL_TABLET | Freq: Three times a day (TID) | ORAL | 0 refills | Status: AC
Start: 2020-10-25 — End: ?

## 2020-10-25 NOTE — Discharge Summary (Signed)
Physician Discharge Summary  Cindy Burns WYO:[id number] DOB: 1946/02/20 DOA: 10/22/2020  PCP: Cindy Sanders, MD  Admit date: 10/22/2020 Discharge date: 10/27/2020  Admitted From: home Disposition:  home  Recommendations for Outpatient Follow-up:  1. Encourage compliance with dialysis  Home Health:  none  Discharge Condition:  stable   CODE STATUS:  Full code  Consultations:  nephrology  .     Discharge Diagnoses:  Principal Problem:   Acute metabolic encephalopathy Active Problems:   Hyperkalemia   Metabolic acidosis   ESRD (end stage renal disease) ([place])   Diabetes mellitus, controlled ([place])     Brief Summary: 75 year old female with ESRD on HD, hypertension,  diabetes mellitus, depression, hyperlipidemia presented with increasing confusion lethargy and hypothermia. As per the report she has been feeling depressed lately, but she denied any suicidal ideas or what activity. Her last dialysis was Tuesday,then PASTThursday, she "was feeling great, and thinking I can go without it any longer" and did not go HD.1/16 morning, she woke  up feeling very tired and sleepy and cold- a friend who visited [place]. She denied any chest pain, no shortness of breath no fever. She still makes urine.  Hospital Course:  Acute encephalopathy - due to missing dialysis - she has been adequately dialyzed and states plans to be compliant with dialysis treatments  Hyperkalemia and metabolic acidosis- ESRD on dialysis - K 7.2, Bicarb 10-  due to missing dialysis- has been treated and improved  DM2, controlled  - she takes Januvia as outpt Hemoglobin A1C    Component Value Date/Time   HGBA1C 4.4 (L) 09/06/2020 0318     Discharge Exam: Vitals:   10/25/20 1055 10/25/20 1143  BP: (!) 142/69 (!) 159/67  Pulse:  71  Resp:    Temp: 97.8 F (36.6 C) 98.2 F (36.8 C)  SpO2:  99%   Vitals:   10/25/20 1030 10/25/20 1053 10/25/20 1055 10/25/20 1143  BP: 135/68 123/61 (!)  142/69 (!) 159/67  Pulse:    71  Resp:      Temp:   97.8 F (36.6 C) 98.2 F (36.8 C)  TempSrc:   Oral Oral  SpO2:  99%  99%  Weight:  48.6 kg      General: Pt is alert, awake, not in acute distress Cardiovascular: RRR, S1/S2 +, no rubs, no gallops Respiratory: CTA bilaterally, no wheezing, no rhonchi Abdominal: Soft, NT, ND, bowel sounds + Extremities: no edema, no cyanosis   Discharge Instructions  Discharge Instructions    Increase activity slowly   Complete by: As directed      Allergies as of 10/25/2020      Reactions   Metoprolol Other (See Comments)   Caused issues with memory and speech      Medication List    TAKE these medications   albuterol 108 (90 Base) MCG/ACT inhaler Commonly known as: VENTOLIN HFA TAKE 2 PUFFS BY MOUTH EVERY 6 HOURS AS NEEDED FOR WHEEZE OR SHORTNESS OF BREATH What changed: See the new instructions.   amLODipine 10 MG tablet Commonly known as: NORVASC Take 1 tablet (10 mg total) by mouth daily. What changed: when to take this   Bayer Low Dose 81 MG EC tablet Generic drug: aspirin Take 81 mg by mouth in the morning. Swallow whole.   diclofenac Sodium 1 % Gel Commonly known as: VOLTAREN Apply 2-4 g topically 4 (four) times daily as needed (to painful sites).   folic acid 1 MG tablet Commonly known as: [employer]  Take 1 tablet (1 mg total) by mouth daily.   gabapentin 300 MG capsule Commonly known as: NEURONTIN 300 mg after dialysis only on dialysis days What changed:   how much to take  how to take this  when to take this  additional instructions   ipratropium-albuterol 0.5-2.5 (3) MG/3ML Soln Commonly known as: DUONEB Take 3 mLs by nebulization every 6 (six) hours as needed. What changed: reasons to take this   metoprolol tartrate 25 MG tablet Commonly known as: LOPRESSOR Take 1 tablet (25 mg total) by mouth 2 (two) times daily.   rosuvastatin 20 MG tablet Commonly known as: CRESTOR TAKE 1 TABLET BY MOUTH EVERY  DAY What changed: when to take this   sevelamer carbonate 800 MG tablet Commonly known as: RENVELA Take 1,600 mg by mouth See admin instructions. Take 1,600 mg by mouth with meals (when eating)   sitaGLIPtin 25 MG tablet Commonly known as: Januvia Take 1 tablet (25 mg total) by mouth daily. What changed: when to take this   sodium bicarbonate 650 MG tablet Take 2 tablets (1,300 mg total) by mouth 3 (three) times daily.   venlafaxine XR 37.5 MG 24 hr capsule Commonly known as: EFFEXOR-XR Take 1 capsule (37.5 mg total) by mouth at bedtime.   vitamin B-12 1000 MCG tablet Commonly known as: CYANOCOBALAMIN Take 1 tablet (1,000 mcg total) by mouth daily.       Allergies  Allergen Reactions  . Metoprolol Other (See Comments)    Caused issues with memory and speech      CT HEAD WO CONTRAST  Result Date: 10/23/2020 CLINICAL DATA:  Change in mental status EXAM: CT HEAD WITHOUT CONTRAST TECHNIQUE: Contiguous axial images were obtained from the base of the skull through the vertex without intravenous contrast. COMPARISON:  September 05, 2020 FINDINGS: Brain: No evidence of acute territorial infarction, hemorrhage, hydrocephalus,extra-axial collection or mass lesion/mass effect. There is dilatation the ventricles and sulci consistent with age-related atrophy. Low-attenuation changes in the deep white matter consistent with small vessel ischemia. Again noted is a prior area of encephalomalacia involving the right occipital lobe. Vascular: No hyperdense vessel or unexpected calcification. Skull: The skull is intact. No fracture or focal lesion identified. Sinuses/Orbits: A small amount of fluid is seen within the bilateral sphenoid sinuses. The orbits and globes intact. Other: None IMPRESSION: No acute intracranial abnormality. Findings consistent with age related atrophy and chronic small vessel ischemia Area of encephalomalacia involving the right occipital lobe. Electronically Signed   By:  Cindy Burns M.D.   On: 10/23/2020 00:56   DG Chest Port 1 View  Result Date: 10/22/2020 CLINICAL DATA:  Sepsis, evaluate for abnormality. Refusing dialysis. EXAM: PORTABLE CHEST 1 VIEW COMPARISON:  Chest x-ray 09/05/2020, CT chest 02/11/2014 FINDINGS: Right chest wall dialysis catheter with tip overlying the right atrium. The heart size and mediastinal contours are unchanged. Aortic arch calcifications. Vague airspace opacity within the peripheral mid lung zone. Chronic increased interstitial markings with no overt pulmonary edema. No pleural effusion. No pneumothorax. No acute osseous abnormality. Known thoracic compression fractures not well visualized. Surgical clips overlie the left axilla. IMPRESSION: Vague airspace opacity within the peripheral mid lung zone. Recommend repeat PA and lateral view of the chest. Electronically Signed   By: Iven Finn M.D.   On: 10/22/2020 16:22     The results of significant diagnostics from this hospitalization (including imaging, microbiology, ancillary and laboratory) are listed below for reference.     Microbiology: Recent Results (from the  past 240 hour(s))  Blood culture (routine single)     Status: None   Collection Time: 10/22/20  4:06 PM   Specimen: BLOOD RIGHT FOREARM  Result Value Ref Range Status   Specimen Description BLOOD RIGHT FOREARM  Final   Special Requests   Final    BOTTLES DRAWN AEROBIC AND ANAEROBIC Blood Culture results may not be optimal due to an inadequate volume of blood received in culture bottles   Culture   Final    NO GROWTH 5 DAYS Performed at [facility], [place] [address], [place], [place] [address]    Report Status 10/27/2020 FINAL  Final  Resp Panel by RT-PCR (Flu A&B, Covid) Nasopharyngeal Swab     Status: None   Collection Time: 10/22/20  4:22 PM   Specimen: Nasopharyngeal Swab; Nasopharyngeal(NP) swabs in vial transport medium  Result Value Ref Range Status   SARS Coronavirus 2 by RT PCR NEGATIVE  NEGATIVE Final    Comment: (NOTE) SARS-CoV-2 target nucleic acids are NOT DETECTED.  The SARS-CoV-2 RNA is generally detectable in upper respiratory specimens during the acute phase of infection. The lowest concentration of SARS-CoV-2 viral copies this assay can detect is 138 copies/mL. A negative result does not preclude SARS-Cov-2 infection and should not be used as the sole basis for treatment or other patient management decisions. A negative result may occur with  improper specimen collection/handling, submission of specimen other than nasopharyngeal swab, presence of viral mutation(s) within the areas targeted by this assay, and inadequate number of viral copies(<138 copies/mL). A negative result must be combined with clinical observations, patient history, and epidemiological information. The expected result is Negative.  Fact Sheet for Patients:  EntrepreneurPulse.com.au  Fact Sheet for Healthcare Providers:  IncredibleEmployment.be  This test is no t yet approved or cleared by the [place] FDA and  has been authorized for detection and/or diagnosis of SARS-CoV-2 by FDA under an Emergency Use Authorization (EUA). This EUA will remain  in effect (meaning this test can be used) for the duration of the COVID-19 declaration under Section 564(b)(1) of the Act, 21 U.S.C.section 360bbb-3(b)(1), unless the authorization is terminated  or revoked sooner.       Influenza A by PCR NEGATIVE NEGATIVE Final   Influenza B by PCR NEGATIVE NEGATIVE Final    Comment: (NOTE) The Xpert Xpress SARS-CoV-2/FLU/RSV plus assay is intended as an aid in the diagnosis of influenza from Nasopharyngeal swab specimens and should not be used as a sole basis for treatment. Nasal washings and aspirates are unacceptable for Xpert Xpress SARS-CoV-2/FLU/RSV testing.  Fact Sheet for Patients: EntrepreneurPulse.com.au  Fact Sheet for Healthcare  Providers: IncredibleEmployment.be  This test is not yet approved or cleared by the [place] FDA and has been authorized for detection and/or diagnosis of SARS-CoV-2 by FDA under an Emergency Use Authorization (EUA). This EUA will remain in effect (meaning this test can be used) for the duration of the COVID-19 declaration under Section 564(b)(1) of the Act, 21 U.S.C. section 360bbb-3(b)(1), unless the authorization is terminated or revoked.  Performed at [facility], [place] [address] [place], [place] [address]      Labs: BNP (last 3 results) Recent Labs    01/08/20 1412  BNP 45.3   Basic Metabolic Panel: Recent Labs  Lab 10/22/20 1640 10/22/20 1910 10/22/20 2320 10/23/20 0444 10/24/20 0650 10/25/20 0516 10/25/20 1641  NA  --    < > 141 142 139 138 139  K  --    < >  4.7 4.6 3.0* 2.7* 3.7  CL  --    < > 106 104 98 97* 100  CO2  --    < > 11* 13* 25 26 27   GLUCOSE  --    < > 282* 94 96 86 105*  BUN  --    < > 151* 148* 40* 50* 12  CREATININE  --    < > 11.00* 10.86* 5.49* 6.83* 2.94*  CALCIUM  --    < > 7.7* 7.5* 7.3* 6.9* 7.9*  MG 2.0  --   --   --   --   --   --   PHOS 8.5*  --   --   --   --   --   --    < > = values in this interval not displayed.   Liver Function Tests: Recent Labs  Lab 10/24/20 0650  AST 24  ALT 14  ALKPHOS 58  BILITOT 0.9  PROT 5.8*  ALBUMIN 3.0*   No results for input(s): LIPASE, AMYLASE in the last 168 hours. No results for input(s): AMMONIA in the last 168 hours. CBC: Recent Labs  Lab 10/22/20 1444 10/22/20 1916 10/23/20 0444 10/24/20 0650 10/25/20 0516  WBC 6.1  --  5.0 3.8* 3.6*  HGB 10.1* 8.5* 9.1* 8.4* 8.1*  HCT 31.5* 25.0* 28.2* 26.1* 24.3*  MCV 102.3*  --  98.6 98.1 96.4  PLT 166  --  177 139* 135*   Cardiac Enzymes: No results for input(s): CKTOTAL, CKMB, CKMBINDEX, TROPONINI in the last 168 hours. BNP: Invalid input(s): POCBNP CBG: Recent Labs  Lab 10/24/20 1219 10/24/20 1616  10/24/20 2106 10/25/20 0606 10/25/20 1144  GLUCAP 99 95 104* 89 72   D-Dimer No results for input(s): DDIMER in the last 72 hours. Hgb A1c No results for input(s): HGBA1C in the last 72 hours. Lipid Profile No results for input(s): CHOL, HDL, LDLCALC, TRIG, CHOLHDL, LDLDIRECT in the last 72 hours. Thyroid function studies No results for input(s): TSH, T4TOTAL, T3FREE, THYROIDAB in the last 72 hours.  Invalid input(s): FREET3 Anemia work up No results for input(s): VITAMINB12, FOLATE, FERRITIN, TIBC, IRON, RETICCTPCT in the last 72 hours. Urinalysis    Component Value Date/Time   COLORURINE COLORLESS (A) 09/06/2020 0547   APPEARANCEUR TURBID (A) 09/06/2020 0547   LABSPEC 1.004 (L) 09/06/2020 0547   PHURINE 7.0 09/06/2020 0547   GLUCOSEU NEGATIVE 09/06/2020 0547   HGBUR SMALL (A) 09/06/2020 0547   HGBUR trace-intact 06/24/2008 0912   BILIRUBINUR NEGATIVE 09/06/2020 0547   BILIRUBINUR negative 09/23/2013 1559   KETONESUR NEGATIVE 09/06/2020 0547   PROTEINUR 100 (A) 09/06/2020 0547   UROBILINOGEN 0.2 09/23/2013 1559   UROBILINOGEN 0.2 06/24/2008 0912   NITRITE NEGATIVE 09/06/2020 0547   LEUKOCYTESUR MODERATE (A) 09/06/2020 0547   Sepsis Labs Invalid input(s): PROCALCITONIN,  WBC,  LACTICIDVEN Microbiology Recent Results (from the past 240 hour(s))  Blood culture (routine single)     Status: None   Collection Time: 10/22/20  4:06 PM   Specimen: BLOOD RIGHT FOREARM  Result Value Ref Range Status   Specimen Description BLOOD RIGHT FOREARM  Final   Special Requests   Final    BOTTLES DRAWN AEROBIC AND ANAEROBIC Blood Culture results may not be optimal due to an inadequate volume of blood received in culture bottles   Culture   Final    NO GROWTH 5 DAYS Performed at [facility], [place] [address], [place],  [address]    Report Status  10/27/2020 FINAL  Final  Resp Panel by RT-PCR (Flu A&B, Covid) Nasopharyngeal Swab     Status: None   Collection Time: 10/22/20   4:22 PM   Specimen: Nasopharyngeal Swab; Nasopharyngeal(NP) swabs in vial transport medium  Result Value Ref Range Status   SARS Coronavirus 2 by RT PCR NEGATIVE NEGATIVE Final    Comment: (NOTE) SARS-CoV-2 target nucleic acids are NOT DETECTED.  The SARS-CoV-2 RNA is generally detectable in upper respiratory specimens during the acute phase of infection. The lowest concentration of SARS-CoV-2 viral copies this assay can detect is 138 copies/mL. A negative result does not preclude SARS-Cov-2 infection and should not be used as the sole basis for treatment or other patient management decisions. A negative result may occur with  improper specimen collection/handling, submission of specimen other than nasopharyngeal swab, presence of viral mutation(s) within the areas targeted by this assay, and inadequate number of viral copies(<138 copies/mL). A negative result must be combined with clinical observations, patient history, and epidemiological information. The expected result is Negative.  Fact Sheet for Patients:  EntrepreneurPulse.com.au  Fact Sheet for Healthcare Providers:  IncredibleEmployment.be  This test is no t yet approved or cleared by the [place] FDA and  has been authorized for detection and/or diagnosis of SARS-CoV-2 by FDA under an Emergency Use Authorization (EUA). This EUA will remain  in effect (meaning this test can be used) for the duration of the COVID-19 declaration under Section 564(b)(1) of the Act, 21 U.S.C.section 360bbb-3(b)(1), unless the authorization is terminated  or revoked sooner.       Influenza A by PCR NEGATIVE NEGATIVE Final   Influenza B by PCR NEGATIVE NEGATIVE Final    Comment: (NOTE) The Xpert Xpress SARS-CoV-2/FLU/RSV plus assay is intended as an aid in the diagnosis of influenza from Nasopharyngeal swab specimens and should not be used as a sole basis for treatment. Nasal washings and aspirates  are unacceptable for Xpert Xpress SARS-CoV-2/FLU/RSV testing.  Fact Sheet for Patients: EntrepreneurPulse.com.au  Fact Sheet for Healthcare Providers: IncredibleEmployment.be  This test is not yet approved or cleared by the [place] FDA and has been authorized for detection and/or diagnosis of SARS-CoV-2 by FDA under an Emergency Use Authorization (EUA). This EUA will remain in effect (meaning this test can be used) for the duration of the COVID-19 declaration under Section 564(b)(1) of the Act, 21 U.S.C. section 360bbb-3(b)(1), unless the authorization is terminated or revoked.  Performed at [facility], [place] [address], [place], [place] [address]      Time coordinating discharge in minutes: 65  SIGNED:   Debbe Odea, MD  [facility] 10/27/2020, 2:32 PM

## 2020-10-25 NOTE — Progress Notes (Signed)
Patient was ready for D/C when I arrived. Patient's IV removed, HD cath dressing changed and family waiting outside. Patient was taken to daughter outside.

## 2020-10-25 NOTE — TOC Initial Note (Signed)
Transition of Care [facility]) - Initial/Assessment Note    Patient Details  Name: Cindy Burns MRN: [id number] Date of Birth: 1946-09-13  [facility]) CM/SW Contact:    Zenon Mayo, RN Phone Number: 10/25/2020, 4:02 PM  Clinical Narrative:                 NCM spoke with patient at bedside, she is refusing [place] services at this time, she lives in a mobile home alone.  Has transportation to HD.    Expected Discharge Plan: [place] Barriers to Discharge: No Barriers Identified   Patient Goals and CMS Choice Patient states their goals for this hospitalization and ongoing recovery are:: get better CMS Medicare.gov Compare Post Acute Care list provided to:: Patient Choice offered to / list presented to : Patient  Expected Discharge Plan and Services Expected Discharge Plan: [place]   Discharge Planning Services: CM Consult   Living arrangements for the past 2 months: Mobile Home                   DME Agency: NA       HH Arranged: Refused HH,PT,OT          Prior Living Arrangements/Services Living arrangements for the past 2 months: Mobile Home Lives with:: Self Patient language and need for interpreter reviewed:: Yes Do you feel safe going back to the place where you live?: Yes        Care giver support system in place?: No (comment)   Criminal Activity/Legal Involvement Pertinent to Current Situation/Hospitalization: No - Comment as needed  Activities of Daily Living      Permission Sought/Granted                  Emotional Assessment Appearance:: Appears stated age Attitude/Demeanor/Rapport: Engaged Affect (typically observed): Appropriate Orientation: : Oriented to Self,Oriented to Place,Oriented to  Time,Oriented to Situation Alcohol / Substance Use: Not Applicable Psych Involvement: No (comment)  Admission diagnosis:  Hyperkalemia [E87.5] ESRD (end stage renal disease) ([place]) [I33.8] Acute  metabolic encephalopathy [S50.53] Patient Active Problem List   Diagnosis Date Noted  . Hyperkalemia 10/22/2020  . Acute confusion 09/05/2020  . Bilateral leg pain 08/25/2020  . Folate deficiency 08/17/2020  . Acute metabolic encephalopathy 97/67/3419  . Closed traumatic minimally displaced fracture of two ribs 08/15/2020  . Fever 04/30/2020  . ESRD (end stage renal disease) ([place]) 04/30/2020  . Hypocalcemia 01/19/2020  . Metabolic acidosis 37/90/2409  . Acute respiratory failure with hypoxia ([place]) 01/19/2020  . UTI (urinary tract infection) 01/19/2020  . Encounter for central line placement   . Pain and swelling of ankle, left   . Pressure injury of skin 01/09/2020  . Renal failure 01/08/2020  . Acute renal failure on dialysis ([place])   . Hepatomegaly 07/30/2019  . Right carotid bruit 07/30/2019  . SIADH (syndrome of inappropriate ADH production) ([place]) 04/06/2019  . B12 deficiency 04/06/2019  . Balance problem 03/18/2019  . Ventral hernia 04/03/2017  . Hypertension associated with diabetes ([place]) 02/28/2017  . Chronic insomnia 10/26/2013  . Diabetic autonomic neuropathy ([place]) 08/12/2013  . PULMONARY NODULE 09/27/2009  . ADENOCARCINOMA, BREAST 02/28/2009  . Normocytic anemia 06/24/2008  . Moderate COPD (chronic obstructive pulmonary disease) ([place]) 03/14/2008  . Major depressive disorder, recurrent episode, moderate ([place]) 08/11/2007  . Diabetes mellitus due to underlying condition, controlled, with neurologic complication ([place]) 73/53/2992  . Hyperlipidemia associated with type 2 diabetes mellitus ([place]) 04/30/2007  .  CIGARETTE SMOKER 04/22/2007   PCP:  [name], MD Pharmacy:   [facility], [place], [place] [address], [place] [address] Phone: [phone] Fax: [phone]  [facility] - [place], [place] - [address] [employer], [address] [place] [address] Phone:  [phone] Fax: [phone]     Social Determinants of Health (SDOH) Interventions    Readmission Risk Interventions Readmission Risk Prevention Plan 10/25/2020 08/17/2020  Transportation Screening Complete Complete  PCP or Specialist Appt within 3-5 Days - Not Complete  Not Complete comments - First available appt with PCP 11/19  [place] or [place] - Complete  Social Work Consult for [place] Planning/Counseling - Complete  Palliative Care Screening - Not Applicable  Medication Review Press photographer) Complete -  [place] or Home Care Consult Complete -  SW Recovery Care/Counseling Consult Complete -  Palliative Care Screening Not Applicable -  [place] Not Applicable -  Some recent data might be hidden

## 2020-10-25 NOTE — Progress Notes (Addendum)
Pt's irritable, uncooperative, refused to take her afternoon meds. Cindy Loll, MD. Will continue to monitor.

## 2020-10-25 NOTE — Evaluation (Signed)
Occupational Therapy Evaluation Patient Details Name: Cindy Burns MRN: [id number] DOB: 1946/07/23 Today's Date: 10/25/2020    History of Present Illness 75 year old female with ESRD on HD, hypertension, high diabetes mellitus, depression, hyperlipidemia presented with increasing confusion lethargy and hypothermia. Admitted with acute encephalopathy likely from uremia, severe hyperkalemia and severe metabolic acidosis in setting of missed dialysis.   Clinical Impression   Patient agreeable to OT eval after dialysis.  She was able to walk to the bathroom with RW and [employer].  Decreased safety and RW management noted.  Stand grooming with supervision.  Patient able to stand at sink for 4 minutes while attempting to comb out knots to her hair.  Patient agreeable to sit in recliner, but asked to mobilize in the halls first.  Patient used RW Min Guard to walk the halls.  Patient asking at each exit/entrance "is this the way out."  OT reinforced MD would discharge her when she was medically stable.  OT will follow in the acute setting, unclear if she will need HH follow up.  Currently she is refusing post acute rehab.      Follow Up Recommendations  Home health OT    Equipment Recommendations  None recommended by OT    Recommendations for Other Services       Precautions / Restrictions Precautions Precautions: Fall Restrictions Weight Bearing Restrictions: No      Mobility Bed Mobility Overal bed mobility: Independent                  Transfers Overall transfer level: Needs assistance Equipment used: Rolling walker (2 wheeled) Transfers: Sit to/from [facility] Sit to Stand: Min guard Stand pivot transfers: Min guard       General transfer comment: patient abandoning RW and reaching for objects in her environment to steady herself.  Decreased safety.    Balance Overall balance assessment: Needs assistance Sitting-balance support: Feet  supported Sitting balance-Leahy Scale: Fair     Standing balance support: Bilateral upper extremity supported Standing balance-Leahy Scale: Poor Standing balance comment: reliant on external support                           ADL either performed or assessed with clinical judgement   ADL Overall ADL's : Needs assistance/impaired     Grooming: Wash/dry hands;Wash/dry face;Standing;Supervision/safety   Upper Body Bathing: Supervision/ safety;Sitting   Lower Body Bathing: Sit to/from stand;Min guard   Upper Body Dressing : Sitting   Lower Body Dressing: Min guard;Sit to/from stand   Toilet Transfer: Min guard;Ambulation;RW   Toileting- Clothing Manipulation and Hygiene: Supervision/safety;Sit to/from stand       Functional mobility during ADLs: Min guard;Rolling walker       Vision Patient Visual Report: No change from baseline       Perception     Praxis      Pertinent Vitals/Pain Pain Assessment: Faces Faces Pain Scale: Hurts a little bit Pain Location: abdomen Pain Descriptors / Indicators: Tender Pain Intervention(s): Monitored during session     Hand Dominance Right   Extremity/Trunk Assessment Upper Extremity Assessment Upper Extremity Assessment: Generalized weakness   Lower Extremity Assessment Lower Extremity Assessment: Defer to PT evaluation   Cervical / Trunk Assessment Cervical / Trunk Assessment: Kyphotic   Communication Communication Communication: No difficulties   Cognition Arousal/Alertness: Awake/alert   Overall Cognitive Status: Impaired/Different from baseline  Safety/Judgement: Decreased awareness of safety;Decreased awareness of deficits         General Comments       Exercises     Shoulder Instructions      Home Living Family/patient expects to be discharged to:: Private residence Living Arrangements: Alone Available Help at Discharge: Friend(s);Available  PRN/intermittently Type of Home: Mobile home Home Access: Stairs to enter Entrance Stairs-Number of Steps: 3    Home Layout: One level     Bathroom Shower/Tub: Occupational psychologist: Standard     Home Equipment: Cane - single point;Walker - 4 wheels          Prior Functioning/Environment    Gait / Transfers Assistance Needed: SPC ADL's / Homemaking Assistance Needed: Independent ADL's, drives to grocery store, [employer] transportation to dialysis            OT Problem List: Decreased strength;Decreased activity tolerance;Impaired balance (sitting and/or standing);Decreased safety awareness      OT Treatment/Interventions: Self-care/ADL training;Therapeutic exercise;Energy conservation;DME and/or AE instruction;Balance training;Therapeutic activities    OT Goals(Current goals can be found in the care plan section) Acute Rehab OT Goals Patient Stated Goal: Patient is hoping to go home tomorrow OT Goal Formulation: With patient Time For Goal Achievement: 11/08/20 Potential to Achieve Goals: Good ADL Goals Pt Will Perform Grooming: with modified independence;standing Pt Will Perform Upper Body Bathing: Independently;sitting Pt Will Perform Lower Body Bathing: with modified independence;sit to/from stand Pt Will Perform Upper Body Dressing: Independently;sitting Pt Will Perform Lower Body Dressing: with modified independence;sit to/from stand Pt Will Transfer to Toilet: with modified independence;ambulating;regular height toilet Pt Will Perform Toileting - Clothing Manipulation and hygiene: Independently;sit to/from stand  OT Frequency: Min 2X/week   Barriers to D/C:    none noted       Co-evaluation              AM-PAC OT "6 Clicks" Daily Activity     Outcome Measure Help from another person eating meals?: None Help from another person taking care of personal grooming?: A Little Help from another person toileting, which includes using toliet,  bedpan, or urinal?: A Little Help from another person bathing (including washing, rinsing, drying)?: A Little Help from another person to put on and taking off regular upper body clothing?: A Little Help from another person to put on and taking off regular lower body clothing?: A Little 6 Click Score: 19   End of Session Equipment Utilized During Treatment: Gait belt;Rolling walker;Oxygen Nurse Communication: Mobility status  Activity Tolerance: Patient tolerated treatment well Patient left: in chair;with call bell/phone within reach;with chair alarm set  OT Visit Diagnosis: Unsteadiness on feet (R26.81);Other symptoms and signs involving cognitive function                Time: 1340-1403 OT Time Calculation (min): 23 min Charges:  OT General Charges $OT Visit: 1 Visit OT Evaluation $OT Eval Moderate Complexity: 1 Mod OT Treatments $Self Care/Home Management : 8-22 mins  10/25/2020  Rich, OTR/L  Acute Rehabilitation Services  Office:  [phone]   Metta Clines 10/25/2020, 3:00 PM

## 2020-10-25 NOTE — Progress Notes (Signed)
CRITICAL VALUE ALERT  Critical Value: K 2.7  Date & Time Notied:  10/25/20  Provider Notified: Wynelle Cleveland, MD  Orders Received/Actions taken: pending

## 2020-10-25 NOTE — Progress Notes (Signed)
[facility] Progress Note   Subjective:   Patient seen and examined at bedside in dialysis.  Tolerating treatment well.  No new complaints.  K low at 2.7 this AM.  Changed to 4K bath.    Objective Vitals:   10/25/20 0745 10/25/20 0750 10/25/20 0800 10/25/20 0830  BP: (!) 159/65 (!) 162/66 (!) 143/67 112/66  Pulse: 70 70 68 68  Resp:      Temp: 97.9 F (36.6 C)     TempSrc: Oral     SpO2: 94%     Weight: 50.1 kg      Physical Exam General:alert, thin, frail, elderly female in NAD Heart:RRR, no mrg Lungs:CTAB, nml WOB Abdomen:soft, NTND Extremities:no LE edema Dialysis Access: R IJ TDC in use   Filed Weights   10/24/20 0631 10/25/20 0418 10/25/20 0745  Weight: 49.8 kg 50.1 kg 50.1 kg    Intake/Output Summary (Last 24 hours) at 10/25/2020 0852 Last data filed at 10/24/2020 1731 Gross per 24 hour  Intake 720 ml  Output --  Net 720 ml    Additional Objective Labs: Basic Metabolic Panel: Recent Labs  Lab 10/22/20 1640 10/22/20 1910 10/23/20 0444 10/24/20 0650 10/25/20 0516  NA  --    < > 142 139 138  K  --    < > 4.6 3.0* 2.7*  CL  --    < > 104 98 97*  CO2  --    < > 13* 25 26  GLUCOSE  --    < > 94 96 86  BUN  --    < > 148* 40* 50*  CREATININE  --    < > 10.86* 5.49* 6.83*  CALCIUM  --    < > 7.5* 7.3* 6.9*  PHOS 8.5*  --   --   --   --    < > = values in this interval not displayed.   Liver Function Tests: Recent Labs  Lab 10/24/20 0650  AST 24  ALT 14  ALKPHOS 58  BILITOT 0.9  PROT 5.8*  ALBUMIN 3.0*   CBC: Recent Labs  Lab 10/22/20 1444 10/22/20 1916 10/23/20 0444 10/24/20 0650 10/25/20 0516  WBC 6.1  --  5.0 3.8* 3.6*  HGB 10.1*   < > 9.1* 8.4* 8.1*  HCT 31.5*   < > 28.2* 26.1* 24.3*  MCV 102.3*  --  98.6 98.1 96.4  PLT 166  --  177 139* 135*   < > = values in this interval not displayed.   Blood Culture    Component Value Date/Time   SDES BLOOD RIGHT FOREARM 10/22/2020 1606   SPECREQUEST  10/22/2020 1606     BOTTLES DRAWN AEROBIC AND ANAEROBIC Blood Culture results may not be optimal due to an inadequate volume of blood received in culture bottles   CULT  10/22/2020 1606    NO GROWTH 3 DAYS Performed at [facility], [place] [address], [place], [place] [address]    REPTSTATUS PENDING 10/22/2020 1606    CBG: Recent Labs  Lab 10/24/20 0732 10/24/20 1219 10/24/20 1616 10/24/20 2106 10/25/20 0606  GLUCAP 84 99 95 104* 89    Medications:  . amLODipine  10 mg Oral QHS  . Chlorhexidine Gluconate Cloth  6 each Topical Q0600  . Darbepoetin Alfa      . darbepoetin (ARANESP) injection - DIALYSIS  60 mcg Intravenous Q Wed-HD  . doxercalciferol      . doxercalciferol  1 mcg Intravenous Q M,W,F-HD  .  gabapentin  300 mg Oral QHS  . heparin  5,000 Units Subcutaneous Q12H  . insulin aspart  0-6 Units Subcutaneous TID WC  . metoprolol tartrate  25 mg Oral BID  . rosuvastatin  10 mg Oral QHS  . sevelamer carbonate  1,600 mg Oral TID WC  . sodium bicarbonate  1,300 mg Oral TID  . venlafaxine XR  37.5 mg Oral QHS  . vitamin B-12  1,000 mcg Oral Daily    Dialysis Orders: [place] [facility] 3.5hrs 450/AF 2.0 edw 50kg 2K 2Ca TDC Hep 2000 Hectorol 1  Venofer 50mg  qwk mircera 69mcg q2wks - last 86mcg on 12/7   Home meds: - norvasc 10 / metoprolol 25 bid/ crestor 20 - effexor xr 37.5 hs/ neurontin 300 hs - duoneb qid prn - sitagliptin 25 mg hs - prn's/ vitamins/ supplements  Assessment/ Plan: 1. AMS - improved. Likely d/t uremia, possibly psych component as well 2. Hyperkalemia - 2/2 missed HD.  Resolved post HD. Now hypokalemic with K 2.7 - use increased K bath.  3. ESRD-HD TTS. Missed multiple HD.  Does not want to stop HD.  Off schedule this week due to staffing issues/multiple patients.  4. Depression - seen by psych,they don't think she is suicidal 5. HTN - BP improving with HD, cont meds, no vol excess by exam or CXR.  UF as tolerated.  6. Anemia ckd - Hgb 8.1. Orders  written for aranesp 69mcg qwk 7. MBD ckd - Ca in goal, last phos 8.5. Continue VDRA, binders.  8. Nutrition - renal diet w/fluid restrictions 9. DMT2 - per primary 10. Dispo - ok for d/c from renal standpoint   Jen Mow, PA-C [facility] 10/25/2020,8:52 AM  LOS: 3 days  \

## 2020-10-26 ENCOUNTER — Other Ambulatory Visit: Payer: Self-pay

## 2020-10-26 ENCOUNTER — Telehealth (HOSPITAL_COMMUNITY): Payer: Self-pay | Admitting: Nephrology

## 2020-10-26 NOTE — Telephone Encounter (Signed)
Transition of care contact from inpatient facility  Date of Discharge: 10/25/2020  Date of Contact:  10/26/2020 - attempted Method of contact: Phone  Attempted to contact patient to discuss transition of care from inpatient admission. Patient did not answer the phone. Message was left on the patient's voicemail with call back number [phone].  Of note -- she did NOT show up for her regular dialysis appt today. Advised to call the center to reschedule this on the voicemail.  Veneta Penton, PA-C [employer] Pager [phone]

## 2020-10-26 NOTE — Patient Outreach (Signed)
[facility]) Care Management  10/26/2020  Cindy Burns 1946-08-04 [id number]  [place] Organization [ACO] Patient: [facility] referral  Patient was assessed for [place] [facility] for community services. Patient was previously active with [place] and [place].  Noted there was some difficulty maintaining contact may be due to dialysis schedule which looks like T/TH/Sat schedule.  Call placed to patient regarding being restarted with [facility] services was able to leave a voicemail message requesting a return call.    Plan:  Will assign for restart as she is high risk for unplanned readmissions.   For additional questions or referrals please contact:  Natividad Brood, RN BSN [facility] Liaison  [phone] business mobile phone Toll free office [phone]  Fax number: [phone] [email or web address]

## 2020-10-27 DIAGNOSIS — E1121 Type 2 diabetes mellitus with diabetic nephropathy: Secondary | ICD-10-CM

## 2020-10-27 LAB — CULTURE, BLOOD (SINGLE): Culture: NO GROWTH

## 2020-10-28 ENCOUNTER — Telehealth (HOSPITAL_COMMUNITY): Payer: Self-pay | Admitting: Nephrology

## 2020-10-28 NOTE — Telephone Encounter (Signed)
Transition of care contact from inpatient facility  Date of Discharge: 10/25/2020 Date of Contact: 10/28/2020 -- attempted Method of contact: Phone  Attempted to contact patient to discuss transition of care from inpatient admission. Patient did not answer the phone. Message was left on the patient's voicemail with call back number [phone].  Veneta Penton, PA-C [employer] Pager [phone]

## 2020-10-31 DIAGNOSIS — Z992 Dependence on renal dialysis: Secondary | ICD-10-CM | POA: Diagnosis not present

## 2020-10-31 DIAGNOSIS — T8249XA Other complication of vascular dialysis catheter, initial encounter: Secondary | ICD-10-CM | POA: Diagnosis not present

## 2020-10-31 DIAGNOSIS — N2581 Secondary hyperparathyroidism of renal origin: Secondary | ICD-10-CM | POA: Diagnosis not present

## 2020-10-31 DIAGNOSIS — D509 Iron deficiency anemia, unspecified: Secondary | ICD-10-CM | POA: Diagnosis not present

## 2020-10-31 DIAGNOSIS — D689 Coagulation defect, unspecified: Secondary | ICD-10-CM | POA: Diagnosis not present

## 2020-10-31 DIAGNOSIS — N186 End stage renal disease: Secondary | ICD-10-CM | POA: Diagnosis not present

## 2020-10-31 DIAGNOSIS — D688 Other specified coagulation defects: Secondary | ICD-10-CM | POA: Diagnosis not present

## 2020-11-02 DIAGNOSIS — D689 Coagulation defect, unspecified: Secondary | ICD-10-CM | POA: Diagnosis not present

## 2020-11-02 DIAGNOSIS — N186 End stage renal disease: Secondary | ICD-10-CM | POA: Diagnosis not present

## 2020-11-02 DIAGNOSIS — Z992 Dependence on renal dialysis: Secondary | ICD-10-CM | POA: Diagnosis not present

## 2020-11-02 DIAGNOSIS — D688 Other specified coagulation defects: Secondary | ICD-10-CM | POA: Diagnosis not present

## 2020-11-02 DIAGNOSIS — N2581 Secondary hyperparathyroidism of renal origin: Secondary | ICD-10-CM | POA: Diagnosis not present

## 2020-11-02 DIAGNOSIS — T8249XA Other complication of vascular dialysis catheter, initial encounter: Secondary | ICD-10-CM | POA: Diagnosis not present

## 2020-11-02 DIAGNOSIS — D509 Iron deficiency anemia, unspecified: Secondary | ICD-10-CM | POA: Diagnosis not present

## 2020-11-03 ENCOUNTER — Encounter: Payer: Self-pay | Admitting: *Deleted

## 2020-11-03 ENCOUNTER — Other Ambulatory Visit: Payer: Self-pay | Admitting: *Deleted

## 2020-11-03 NOTE — Patient Outreach (Signed)
[facility] [facility]) Care Management  11/03/2020  Cindy Burns 07-25-1946 [id number]   [facility] Telephone Assessment/Screen for MD,  post hospitalreferral  Referral Date: 10/26/20  Referral Source: [facility] hospital liaison, Eliott Nine Referral Reason: complex care and disease management follow up calls and assess for further needs  Insurance: .united [place]   Outreach attempt # 1 successful Patient is able to verify HIPAA, DOB and address Reviewed and addressed referral to [facility] with patient Consent: [facility] RN CM reviewed [facility] services with patient. Patient gave verbal consent for services [facility] telephonic RN CM.  DME Will order a new over the counter (OTC) from  [employer] care [facility]) to get assist with a wide base cane to prevent further falls Need ramp friend attempt to assist by consulting a church and habitat but no responses   Hypertension (HTN) does not have a BP cuff    Social: Retired Single, lives alone 1 daughter in [place]  on medicare/medicaid, disabled Support from friends primary [place] x 3 recently   Patient Active Problem List   Diagnosis Date Noted   Diabetes mellitus, controlled ([place]) 10/27/2020   Hyperkalemia 10/22/2020   Acute confusion 09/05/2020   Bilateral leg pain 08/25/2020   Folate deficiency 54/62/7035   Acute metabolic encephalopathy 00/93/8182   Closed traumatic minimally displaced fracture of two ribs 08/15/2020   Fever 04/30/2020   ESRD (end stage renal disease) ([place]) 04/30/2020   Hypocalcemia 99/37/1696   Metabolic acidosis 78/93/8101   Acute respiratory failure with hypoxia ([place]) 01/19/2020   UTI (urinary tract infection) 01/19/2020   Encounter for central line placement    Pain and swelling of ankle, left    Pressure injury of skin 01/09/2020   Renal failure 01/08/2020   Acute renal failure on dialysis ([place])    Hepatomegaly 07/30/2019   Right carotid bruit 07/30/2019   SIADH (syndrome of  inappropriate ADH production) ([place]) 04/06/2019   B12 deficiency 04/06/2019   Balance problem 03/18/2019   Ventral hernia 04/03/2017   Hypertension associated with diabetes ([place]) 02/28/2017   Chronic insomnia 10/26/2013   Diabetic autonomic neuropathy ([place]) 08/12/2013   PULMONARY NODULE 09/27/2009   ADENOCARCINOMA, BREAST 02/28/2009   Normocytic anemia 06/24/2008   Moderate COPD (chronic obstructive pulmonary disease) ([place]) 03/14/2008   Major depressive disorder, recurrent episode, moderate ([place]) 08/11/2007   Diabetes mellitus due to underlying condition, controlled, with neurologic complication ([place]) 75/07/2584   Hyperlipidemia associated with type 2 diabetes mellitus ([place]) 04/30/2007   CIGARETTE SMOKER 04/22/2007       Plan: Patient agrees to the care plan and follow up within the next agreed 30 business days Pt encouraged to return a call to [facility] RN CM prn  Jimmy Stipes L. [name], RN, BSN, [place] Coordinator Office number [phone] Mobile number [phone]  Main [facility] number [phone] Fax number [phone]

## 2020-11-06 DIAGNOSIS — Z992 Dependence on renal dialysis: Secondary | ICD-10-CM | POA: Diagnosis not present

## 2020-11-06 DIAGNOSIS — N179 Acute kidney failure, unspecified: Secondary | ICD-10-CM | POA: Diagnosis not present

## 2020-11-06 DIAGNOSIS — N186 End stage renal disease: Secondary | ICD-10-CM | POA: Diagnosis not present

## 2020-11-07 DIAGNOSIS — D688 Other specified coagulation defects: Secondary | ICD-10-CM | POA: Diagnosis not present

## 2020-11-07 DIAGNOSIS — R197 Diarrhea, unspecified: Secondary | ICD-10-CM | POA: Diagnosis not present

## 2020-11-07 DIAGNOSIS — T8249XA Other complication of vascular dialysis catheter, initial encounter: Secondary | ICD-10-CM | POA: Diagnosis not present

## 2020-11-07 DIAGNOSIS — Z992 Dependence on renal dialysis: Secondary | ICD-10-CM | POA: Diagnosis not present

## 2020-11-07 DIAGNOSIS — N2581 Secondary hyperparathyroidism of renal origin: Secondary | ICD-10-CM | POA: Diagnosis not present

## 2020-11-07 DIAGNOSIS — D509 Iron deficiency anemia, unspecified: Secondary | ICD-10-CM | POA: Diagnosis not present

## 2020-11-07 DIAGNOSIS — D689 Coagulation defect, unspecified: Secondary | ICD-10-CM | POA: Diagnosis not present

## 2020-11-07 DIAGNOSIS — D649 Anemia, unspecified: Secondary | ICD-10-CM | POA: Diagnosis not present

## 2020-11-07 DIAGNOSIS — N186 End stage renal disease: Secondary | ICD-10-CM | POA: Diagnosis not present

## 2020-11-09 DIAGNOSIS — D649 Anemia, unspecified: Secondary | ICD-10-CM | POA: Diagnosis not present

## 2020-11-09 DIAGNOSIS — D689 Coagulation defect, unspecified: Secondary | ICD-10-CM | POA: Diagnosis not present

## 2020-11-09 DIAGNOSIS — R197 Diarrhea, unspecified: Secondary | ICD-10-CM | POA: Diagnosis not present

## 2020-11-09 DIAGNOSIS — T8249XA Other complication of vascular dialysis catheter, initial encounter: Secondary | ICD-10-CM | POA: Diagnosis not present

## 2020-11-09 DIAGNOSIS — N186 End stage renal disease: Secondary | ICD-10-CM | POA: Diagnosis not present

## 2020-11-09 DIAGNOSIS — D509 Iron deficiency anemia, unspecified: Secondary | ICD-10-CM | POA: Diagnosis not present

## 2020-11-09 DIAGNOSIS — Z992 Dependence on renal dialysis: Secondary | ICD-10-CM | POA: Diagnosis not present

## 2020-11-09 DIAGNOSIS — N2581 Secondary hyperparathyroidism of renal origin: Secondary | ICD-10-CM | POA: Diagnosis not present

## 2020-11-09 DIAGNOSIS — D688 Other specified coagulation defects: Secondary | ICD-10-CM | POA: Diagnosis not present

## 2020-11-11 DIAGNOSIS — Z992 Dependence on renal dialysis: Secondary | ICD-10-CM | POA: Diagnosis not present

## 2020-11-11 DIAGNOSIS — N2581 Secondary hyperparathyroidism of renal origin: Secondary | ICD-10-CM | POA: Diagnosis not present

## 2020-11-11 DIAGNOSIS — T8249XA Other complication of vascular dialysis catheter, initial encounter: Secondary | ICD-10-CM | POA: Diagnosis not present

## 2020-11-11 DIAGNOSIS — D649 Anemia, unspecified: Secondary | ICD-10-CM | POA: Diagnosis not present

## 2020-11-11 DIAGNOSIS — D689 Coagulation defect, unspecified: Secondary | ICD-10-CM | POA: Diagnosis not present

## 2020-11-11 DIAGNOSIS — D509 Iron deficiency anemia, unspecified: Secondary | ICD-10-CM | POA: Diagnosis not present

## 2020-11-11 DIAGNOSIS — R197 Diarrhea, unspecified: Secondary | ICD-10-CM | POA: Diagnosis not present

## 2020-11-11 DIAGNOSIS — N186 End stage renal disease: Secondary | ICD-10-CM | POA: Diagnosis not present

## 2020-11-11 DIAGNOSIS — D688 Other specified coagulation defects: Secondary | ICD-10-CM | POA: Diagnosis not present

## 2020-11-16 DIAGNOSIS — D688 Other specified coagulation defects: Secondary | ICD-10-CM | POA: Diagnosis not present

## 2020-11-16 DIAGNOSIS — D649 Anemia, unspecified: Secondary | ICD-10-CM | POA: Diagnosis not present

## 2020-11-16 DIAGNOSIS — D689 Coagulation defect, unspecified: Secondary | ICD-10-CM | POA: Diagnosis not present

## 2020-11-16 DIAGNOSIS — N186 End stage renal disease: Secondary | ICD-10-CM | POA: Diagnosis not present

## 2020-11-16 DIAGNOSIS — R197 Diarrhea, unspecified: Secondary | ICD-10-CM | POA: Diagnosis not present

## 2020-11-16 DIAGNOSIS — Z992 Dependence on renal dialysis: Secondary | ICD-10-CM | POA: Diagnosis not present

## 2020-11-16 DIAGNOSIS — T8249XA Other complication of vascular dialysis catheter, initial encounter: Secondary | ICD-10-CM | POA: Diagnosis not present

## 2020-11-16 DIAGNOSIS — D509 Iron deficiency anemia, unspecified: Secondary | ICD-10-CM | POA: Diagnosis not present

## 2020-11-16 DIAGNOSIS — N2581 Secondary hyperparathyroidism of renal origin: Secondary | ICD-10-CM | POA: Diagnosis not present

## 2020-11-17 ENCOUNTER — Other Ambulatory Visit: Payer: Self-pay | Admitting: *Deleted

## 2020-11-17 ENCOUNTER — Other Ambulatory Visit: Payer: Self-pay

## 2020-11-17 NOTE — Patient Outreach (Addendum)
[place] [facility]) Care Management  11/17/2020  Cindy Burns 06/12/1946 [id number]   [facility] Telephone Assessment/Screen for MD,  post hospital referral  Referral Date: 10/26/20  Referral Source: [facility] hospital liaison, Eliott Nine Referral Reason: complex care and disease management follow up calls and assess for further needs  Insurance: .united [place]    Outreach attempt # 2 successful Patient is able to verify HIPAA, DOB and address Reviewed and addressed referral to [facility] patient Consent: [facility] RN CM reviewed [facility] services with patient. Patient gave verbal consent for services [facility] telephonic RN CM.   Follow up assessment DME Still pending purchase of a BP cuff ordered an over the counter (OTC) from  [employer] care [facility]) to get assist with a wide base cane to prevent further falls Need ramp friend attempt to assist by consulting a church and habitat but no responses   Hypertension (HTN)  Only knows BP values from HD but BP reported to remain high  Pt does not know difference in her personal signs and symptoms (s/s) when experiencing hypotension nor hypertension [facility] RN CM reviewed hypotension & hypertension s/s She denies any  Diabetes (DM) type 2  CBG today was 129 09/06/20 HgA1c was 4.4, has been as high as 8-9 years ago Pt was not aware of the 09/06/20 HgA1c value obtained during her hospitalization Discussed dm zones, HgA1c, home glucose monitoring, Hyperglycemia s/s, hypoglycemia s/s When low fingers stiff cramp and bend on her Denies previous DM education- agrees to further DM education via EMMI to her listed e mail address Confirms use of Januvia    Smoking she had stopped with last admission but started back mid January 2022 when a friend visited who smokes Initially unable to identify her trigger but confirms she has "stress" related to Hemodialysis (HD),  Stress going to HD for 3 1/2 hours on Tuesdays, Thursdays and  Saturdays Reports she "feel no difference after having it"  After inquiry , she shared her experience of being found confused, listless by a friend before her last admission Explained to her the importance of HD to prevent this experience from reoccurring She voiced understanding   Attempted to brainstorm with her on ways of occupying her time during HD like watching westerns she likes on her mobile phone She reports she is not Pharmacologist  Does some crosswords, bingo during HD to pass some time Encouraged doing activities she likes on days she does not go to HD like shopping, driving, and watching westerns Discussed stress related to long HD sessions leading to smoking, brainstormed ideas/activities to pass the time during HD Provided encouragement related to stress and smoking Sent EMMI education for relieving stress, fall prevention and home safety, hypertension, checking your blood pressure at home, getting up from a fall, quitting smoking and low blood pressure Pt on Effexor XR  Assessed for other care coordination, medication, DME, social needs but she denied any Pt voices she "like your calls", appreciate outreaches/education from [place] Patient agrees to care plan and follow up within the next 14-30 business days Pt encouraged to return a call to [facility] RN CM prn Goals Addressed              This Visit's Progress     Patient Stated   .  [facility]) Track and Manage My Blood Pressure-Hypertension (pt-stated)   Not on track     Timeframe:  Short-Term Goal Priority:  Medium Start Date:  11/17/20               Expected End Date:               01/04/21        Follow Up Date 12/01/20   - choose a place to take my blood pressure (home, clinic or office, retail store) - write blood pressure results in a log or diary     Notes: 11/17/20 pending order of BP from [place] program catalog she has ordered        Jasper L. [name], RN, BSN, [place] Coordinator Office number [phone] Main [facility] number [phone] Fax number [phone]

## 2020-11-18 DIAGNOSIS — Z992 Dependence on renal dialysis: Secondary | ICD-10-CM | POA: Diagnosis not present

## 2020-11-18 DIAGNOSIS — R197 Diarrhea, unspecified: Secondary | ICD-10-CM | POA: Diagnosis not present

## 2020-11-18 DIAGNOSIS — N186 End stage renal disease: Secondary | ICD-10-CM | POA: Diagnosis not present

## 2020-11-18 DIAGNOSIS — T8249XA Other complication of vascular dialysis catheter, initial encounter: Secondary | ICD-10-CM | POA: Diagnosis not present

## 2020-11-18 DIAGNOSIS — D689 Coagulation defect, unspecified: Secondary | ICD-10-CM | POA: Diagnosis not present

## 2020-11-18 DIAGNOSIS — D649 Anemia, unspecified: Secondary | ICD-10-CM | POA: Diagnosis not present

## 2020-11-18 DIAGNOSIS — D688 Other specified coagulation defects: Secondary | ICD-10-CM | POA: Diagnosis not present

## 2020-11-18 DIAGNOSIS — N2581 Secondary hyperparathyroidism of renal origin: Secondary | ICD-10-CM | POA: Diagnosis not present

## 2020-11-18 DIAGNOSIS — D509 Iron deficiency anemia, unspecified: Secondary | ICD-10-CM | POA: Diagnosis not present

## 2020-11-21 DIAGNOSIS — J449 Chronic obstructive pulmonary disease, unspecified: Secondary | ICD-10-CM | POA: Diagnosis not present

## 2020-11-21 DIAGNOSIS — M25572 Pain in left ankle and joints of left foot: Secondary | ICD-10-CM | POA: Diagnosis not present

## 2020-11-21 DIAGNOSIS — M25472 Effusion, left ankle: Secondary | ICD-10-CM | POA: Diagnosis not present

## 2020-11-23 DIAGNOSIS — D689 Coagulation defect, unspecified: Secondary | ICD-10-CM | POA: Diagnosis not present

## 2020-11-23 DIAGNOSIS — R197 Diarrhea, unspecified: Secondary | ICD-10-CM | POA: Diagnosis not present

## 2020-11-23 DIAGNOSIS — Z992 Dependence on renal dialysis: Secondary | ICD-10-CM | POA: Diagnosis not present

## 2020-11-23 DIAGNOSIS — D649 Anemia, unspecified: Secondary | ICD-10-CM | POA: Diagnosis not present

## 2020-11-23 DIAGNOSIS — D509 Iron deficiency anemia, unspecified: Secondary | ICD-10-CM | POA: Diagnosis not present

## 2020-11-23 DIAGNOSIS — D688 Other specified coagulation defects: Secondary | ICD-10-CM | POA: Diagnosis not present

## 2020-11-23 DIAGNOSIS — N186 End stage renal disease: Secondary | ICD-10-CM | POA: Diagnosis not present

## 2020-11-23 DIAGNOSIS — T8249XA Other complication of vascular dialysis catheter, initial encounter: Secondary | ICD-10-CM | POA: Diagnosis not present

## 2020-11-23 DIAGNOSIS — N2581 Secondary hyperparathyroidism of renal origin: Secondary | ICD-10-CM | POA: Diagnosis not present

## 2020-11-25 DIAGNOSIS — D509 Iron deficiency anemia, unspecified: Secondary | ICD-10-CM | POA: Diagnosis not present

## 2020-11-25 DIAGNOSIS — N186 End stage renal disease: Secondary | ICD-10-CM | POA: Diagnosis not present

## 2020-11-25 DIAGNOSIS — R197 Diarrhea, unspecified: Secondary | ICD-10-CM | POA: Diagnosis not present

## 2020-11-25 DIAGNOSIS — D688 Other specified coagulation defects: Secondary | ICD-10-CM | POA: Diagnosis not present

## 2020-11-25 DIAGNOSIS — D689 Coagulation defect, unspecified: Secondary | ICD-10-CM | POA: Diagnosis not present

## 2020-11-25 DIAGNOSIS — N2581 Secondary hyperparathyroidism of renal origin: Secondary | ICD-10-CM | POA: Diagnosis not present

## 2020-11-25 DIAGNOSIS — T8249XA Other complication of vascular dialysis catheter, initial encounter: Secondary | ICD-10-CM | POA: Diagnosis not present

## 2020-11-25 DIAGNOSIS — D649 Anemia, unspecified: Secondary | ICD-10-CM | POA: Diagnosis not present

## 2020-11-25 DIAGNOSIS — Z992 Dependence on renal dialysis: Secondary | ICD-10-CM | POA: Diagnosis not present

## 2020-11-28 DIAGNOSIS — Z992 Dependence on renal dialysis: Secondary | ICD-10-CM | POA: Diagnosis not present

## 2020-11-28 DIAGNOSIS — D649 Anemia, unspecified: Secondary | ICD-10-CM | POA: Diagnosis not present

## 2020-11-28 DIAGNOSIS — D509 Iron deficiency anemia, unspecified: Secondary | ICD-10-CM | POA: Diagnosis not present

## 2020-11-28 DIAGNOSIS — D689 Coagulation defect, unspecified: Secondary | ICD-10-CM | POA: Diagnosis not present

## 2020-11-28 DIAGNOSIS — R197 Diarrhea, unspecified: Secondary | ICD-10-CM | POA: Diagnosis not present

## 2020-11-28 DIAGNOSIS — N2581 Secondary hyperparathyroidism of renal origin: Secondary | ICD-10-CM | POA: Diagnosis not present

## 2020-11-28 DIAGNOSIS — T8249XA Other complication of vascular dialysis catheter, initial encounter: Secondary | ICD-10-CM | POA: Diagnosis not present

## 2020-11-28 DIAGNOSIS — D688 Other specified coagulation defects: Secondary | ICD-10-CM | POA: Diagnosis not present

## 2020-11-28 DIAGNOSIS — N186 End stage renal disease: Secondary | ICD-10-CM | POA: Diagnosis not present

## 2020-11-30 ENCOUNTER — Other Ambulatory Visit: Payer: Self-pay | Admitting: Family Medicine

## 2020-11-30 DIAGNOSIS — D689 Coagulation defect, unspecified: Secondary | ICD-10-CM | POA: Diagnosis not present

## 2020-11-30 DIAGNOSIS — D688 Other specified coagulation defects: Secondary | ICD-10-CM | POA: Diagnosis not present

## 2020-11-30 DIAGNOSIS — D509 Iron deficiency anemia, unspecified: Secondary | ICD-10-CM | POA: Diagnosis not present

## 2020-11-30 DIAGNOSIS — Z992 Dependence on renal dialysis: Secondary | ICD-10-CM | POA: Diagnosis not present

## 2020-11-30 DIAGNOSIS — N2581 Secondary hyperparathyroidism of renal origin: Secondary | ICD-10-CM | POA: Diagnosis not present

## 2020-11-30 DIAGNOSIS — R197 Diarrhea, unspecified: Secondary | ICD-10-CM | POA: Diagnosis not present

## 2020-11-30 DIAGNOSIS — T8249XA Other complication of vascular dialysis catheter, initial encounter: Secondary | ICD-10-CM | POA: Diagnosis not present

## 2020-11-30 DIAGNOSIS — N186 End stage renal disease: Secondary | ICD-10-CM | POA: Diagnosis not present

## 2020-11-30 DIAGNOSIS — D649 Anemia, unspecified: Secondary | ICD-10-CM | POA: Diagnosis not present

## 2020-12-01 ENCOUNTER — Other Ambulatory Visit: Payer: Self-pay | Admitting: *Deleted

## 2020-12-01 ENCOUNTER — Other Ambulatory Visit: Payer: Self-pay

## 2020-12-01 NOTE — Patient Outreach (Addendum)
[facility]) Care Management  12/01/2020  Cindy Burns 1946-09-08 [id number]   [facility] Telephone Assessment/Screen for MD,  post hospitalreferral  Referral Date: 10/26/20  Referral Source: [facility] hospital liaison, Eliott Nine Referral Reason: complex care and disease management follow up calls and assess for further needs  Insurance: united [employer] [place] (united healthcare dual complete (HMO SNP)    Outreach attempt successful Patient is able to verify HIPAA, DOB and address Reviewed and addressed referral to [facility] patient Consent: [facility] RN CM reviewed [facility] services with patient. Patient gave verbal consent for services [facility] telephonic RN CM.   Outreached to pcp office to assist pt with making an appointment  Spoke with [name] to scheduled a Friday December 08 2020 340 pm   EMMI re sent via mail as she reports issues with incoming e mail not being received  DME Has not received a new over the counter (OTC) from  [employer] care [facility]) to get assist with a wide base cane to prevent further falls Friend has not been able to successful assist her with getting a ramp by consulting a church and habitat    Hypertension (HTN) still does not have a BP cuff     Social: Retired Education administrator, lives alone 1 daughter in [place]  on medicare/medicaid, disabled Support from friends primary [place] GED obtained   Falls x 3 recently Since 11/17/20 outreach,  no further falls  Hearing fine   [facility] RN CM interventions Pt assisted pt with conference outreaches to schedule for her to receive a new UHC card and OTC information in the mail and online to assist with ordering a BP cuff Outreach to [facility] and spoke with [employer] [phone] transferred to [place]  She was issues a new insurance card  She requested an e mail OTC catalog and a manual OTC catalog (2-4 weeks)   Pt assisted pt with various conference outreaches to schedule appointments with preventive care  providers:  [facility] [phone] outreached and spoke with staff who confirms they are not contracted with united health care Pt scheduled a free appointment for Wednesday 05/09/21 1445 Outreach to LTR dental Dr Martinique Thomas Spoke with [place]  [place] 2001 Friday January 12, 2021 1500 arrive 10 minutes early  Ophthalmologist Outreach to [place] spoke [place] to schedule an appointment for Wednesday December 20 2020 1040 Dr Jimmye Norman dolan  Patient Active Problem List   Diagnosis Date Noted  . Diabetes mellitus, controlled ([place]) 10/27/2020  . Hyperkalemia 10/22/2020  . Acute confusion 09/05/2020  . Bilateral leg pain 08/25/2020  . Folate deficiency 08/17/2020  . Acute metabolic encephalopathy 24/40/1027  . Closed traumatic minimally displaced fracture of two ribs 08/15/2020  . Fever 04/30/2020  . ESRD (end stage renal disease) ([place]) 04/30/2020  . Hypocalcemia 01/19/2020  . Metabolic acidosis 25/36/6440  . Acute respiratory failure with hypoxia ([place]) 01/19/2020  . UTI (urinary tract infection) 01/19/2020  . Encounter for central line placement   . Pain and swelling of ankle, left   . Pressure injury of skin 01/09/2020  . Renal failure 01/08/2020  . Acute renal failure on dialysis ([place])   . Hepatomegaly 07/30/2019  . Right carotid bruit 07/30/2019  . SIADH (syndrome of inappropriate ADH production) ([place]) 04/06/2019  . B12 deficiency 04/06/2019  . Balance problem 03/18/2019  . Ventral hernia 04/03/2017  . Hypertension  associated with diabetes ([place]) 02/28/2017  . Chronic insomnia 10/26/2013  . Diabetic autonomic neuropathy ([place]) 08/12/2013  . PULMONARY NODULE 09/27/2009  . ADENOCARCINOMA, BREAST 02/28/2009  . Normocytic anemia 06/24/2008  . Moderate COPD (chronic obstructive pulmonary disease) ([place]) 03/14/2008  . Major depressive disorder, recurrent episode, moderate ([place]) 08/11/2007  .  Diabetes mellitus due to underlying condition, controlled, with neurologic complication ([place]) 62/44/6950  . Hyperlipidemia associated with type 2 diabetes mellitus ([place]) 04/30/2007  . CIGARETTE SMOKER 04/22/2007     Plans  Patient agrees to care plan and follow up within 30 business days  Pt encouraged to return a call to [facility] RN CM prn Goals Addressed              This Visit's Progress     Patient Stated   .  [facility]) Track and Manage My Blood Pressure-Hypertension (pt-stated)   On track     Timeframe:  Short-Term Goal Priority:  Medium Start Date:              11/17/20               Expected End Date:               01/04/21        Follow Up Date 12/01/20   - choose a place to take my blood pressure (home, clinic or office, retail store) - write blood pressure results in a log or diary       Notes:  12/01/20 worked with RN CM to get new UHC OTC catalog to get BP cuff - conference calls to [facility]  11/17/20 pending order of BP from [place] program catalog she has ordered         Garrison L. [name], RN, BSN, [place] Coordinator Office number [phone] [facility] number [phone] Fax number [phone]

## 2020-12-02 DIAGNOSIS — N186 End stage renal disease: Secondary | ICD-10-CM | POA: Diagnosis not present

## 2020-12-02 DIAGNOSIS — N2581 Secondary hyperparathyroidism of renal origin: Secondary | ICD-10-CM | POA: Diagnosis not present

## 2020-12-02 DIAGNOSIS — D688 Other specified coagulation defects: Secondary | ICD-10-CM | POA: Diagnosis not present

## 2020-12-02 DIAGNOSIS — D509 Iron deficiency anemia, unspecified: Secondary | ICD-10-CM | POA: Diagnosis not present

## 2020-12-02 DIAGNOSIS — T8249XA Other complication of vascular dialysis catheter, initial encounter: Secondary | ICD-10-CM | POA: Diagnosis not present

## 2020-12-02 DIAGNOSIS — Z992 Dependence on renal dialysis: Secondary | ICD-10-CM | POA: Diagnosis not present

## 2020-12-02 DIAGNOSIS — R197 Diarrhea, unspecified: Secondary | ICD-10-CM | POA: Diagnosis not present

## 2020-12-02 DIAGNOSIS — D649 Anemia, unspecified: Secondary | ICD-10-CM | POA: Diagnosis not present

## 2020-12-02 DIAGNOSIS — D689 Coagulation defect, unspecified: Secondary | ICD-10-CM | POA: Diagnosis not present

## 2020-12-04 ENCOUNTER — Encounter: Payer: Self-pay | Admitting: *Deleted

## 2020-12-04 DIAGNOSIS — Z992 Dependence on renal dialysis: Secondary | ICD-10-CM | POA: Diagnosis not present

## 2020-12-04 DIAGNOSIS — N186 End stage renal disease: Secondary | ICD-10-CM | POA: Diagnosis not present

## 2020-12-04 DIAGNOSIS — N179 Acute kidney failure, unspecified: Secondary | ICD-10-CM | POA: Diagnosis not present

## 2020-12-05 DIAGNOSIS — D688 Other specified coagulation defects: Secondary | ICD-10-CM | POA: Diagnosis not present

## 2020-12-05 DIAGNOSIS — Z992 Dependence on renal dialysis: Secondary | ICD-10-CM | POA: Diagnosis not present

## 2020-12-05 DIAGNOSIS — N186 End stage renal disease: Secondary | ICD-10-CM | POA: Diagnosis not present

## 2020-12-05 DIAGNOSIS — D689 Coagulation defect, unspecified: Secondary | ICD-10-CM | POA: Diagnosis not present

## 2020-12-05 DIAGNOSIS — T8249XA Other complication of vascular dialysis catheter, initial encounter: Secondary | ICD-10-CM | POA: Diagnosis not present

## 2020-12-05 DIAGNOSIS — R197 Diarrhea, unspecified: Secondary | ICD-10-CM | POA: Diagnosis not present

## 2020-12-05 DIAGNOSIS — N2581 Secondary hyperparathyroidism of renal origin: Secondary | ICD-10-CM | POA: Diagnosis not present

## 2020-12-08 ENCOUNTER — Other Ambulatory Visit: Payer: Self-pay

## 2020-12-08 ENCOUNTER — Encounter: Payer: Self-pay | Admitting: Family Medicine

## 2020-12-08 ENCOUNTER — Ambulatory Visit (INDEPENDENT_AMBULATORY_CARE_PROVIDER_SITE_OTHER): Payer: Medicare Other | Admitting: Family Medicine

## 2020-12-08 VITALS — BP 150/70 | HR 78 | Temp 98.2°F | Ht 61.5 in | Wt 112.8 lb

## 2020-12-08 DIAGNOSIS — M1712 Unilateral primary osteoarthritis, left knee: Secondary | ICD-10-CM

## 2020-12-08 DIAGNOSIS — E0840 Diabetes mellitus due to underlying condition with diabetic neuropathy, unspecified: Secondary | ICD-10-CM

## 2020-12-08 DIAGNOSIS — N186 End stage renal disease: Secondary | ICD-10-CM | POA: Diagnosis not present

## 2020-12-08 DIAGNOSIS — M545 Low back pain, unspecified: Secondary | ICD-10-CM | POA: Diagnosis not present

## 2020-12-08 DIAGNOSIS — E1121 Type 2 diabetes mellitus with diabetic nephropathy: Secondary | ICD-10-CM

## 2020-12-08 LAB — POCT GLYCOSYLATED HEMOGLOBIN (HGB A1C): Hemoglobin A1C: 4.7 % (ref 4.0–5.6)

## 2020-12-08 MED ORDER — TRAMADOL HCL 50 MG PO TABS
25.0000 mg | ORAL_TABLET | Freq: Two times a day (BID) | ORAL | 0 refills | Status: DC | PRN
Start: 2020-12-08 — End: 2021-01-18

## 2020-12-08 NOTE — Patient Instructions (Signed)
Try tramadol for low back and left knee pain.  Start gentle low back stretching. Continue current medications.  If not improving follow up.

## 2020-12-08 NOTE — Assessment & Plan Note (Signed)
No clear indication for  X-ray.  Diffuse low back pain.[name] NSAIDs contraindicated.  Start home PT, heat and use tramadol low dose prn pain.

## 2020-12-08 NOTE — Assessment & Plan Note (Signed)
Encouraged compliance with dialysis.

## 2020-12-08 NOTE — Progress Notes (Signed)
Patient ID: Cindy Burns, female    DOB: Sep 21, 1946, 75 y.o.   MRN: [id number]  This visit was conducted in person.  BP (!) 150/70   Pulse 78   Temp 98.2 F (36.8 C) (Temporal)   Ht 5' 1.5" (1.562 m)   Wt 112 lb 12 oz (51.1 kg)   SpO2 95%   BMI 20.96 kg/m    CC:  Chief Complaint  Patient presents with  . Hospitalization Follow-up    Subjective:   HPI: Cindy Burns is a 75 y.o. female  with controlled DM and ESRD on dialysis presenting on 12/08/2020 for Hospitalization Follow-up  She was admitted on 9/79/-8/92/11 for metabolic encephalopathy, hyperkalemia and metabolic acidosis from missing dialysis.   She reports since she is doing 2-3 days of dialysis.  She is thinking clearer now. Across low back pain from leaning over using the cane, and left knee pain  Bothering her in last 3 months.  She has some deformity in   Left knee, no redness.  No radiaiton of pain.  Voltaren gel has not helped much.. using 3 times daily.   Using cane for stability. Gabapentin is helping with pain in feet somewhat.      She is due for re-eval on lower dose Januvia.  FBS 126-130 Lab Results  Component Value Date   HGBA1C 4.7 12/08/2020   Upcoming eye exam.  Last LDL 65  at goal on 09/06/2020 on Crestor.     Relevant past medical, surgical, family and social history reviewed and updated as indicated. Interim medical history since our last visit reviewed. Allergies and medications reviewed and updated. Outpatient Medications Prior to Visit  Medication Sig Dispense Refill  . albuterol (VENTOLIN HFA) 108 (90 Base) MCG/ACT inhaler TAKE 2 PUFFS BY MOUTH EVERY 6 HOURS AS NEEDED FOR WHEEZE OR SHORTNESS OF BREATH 8.5 g 2  . amLODipine (NORVASC) 10 MG tablet Take 1 tablet (10 mg total) by mouth daily. 30 tablet 0  . BAYER LOW DOSE 81 MG EC tablet Take 81 mg by mouth in the morning. Swallow whole.    . diclofenac Sodium (VOLTAREN) 1 % GEL Apply 2-4 g topically 4 (four) times daily as  needed (to painful sites).     . folic acid (FOLVITE) 1 MG tablet Take 1 tablet (1 mg total) by mouth daily. 30 tablet 3  . gabapentin (NEURONTIN) 100 MG capsule Take 100 mg by mouth 2 (two) times daily.    [name] gabapentin (NEURONTIN) 300 MG capsule Take 300 mg by mouth 2 (two) times daily. Except on dialysis days-then take it after dialysis    . ipratropium-albuterol (DUONEB) 0.5-2.5 (3) MG/3ML SOLN Take 3 mLs by nebulization every 6 (six) hours as needed. 360 mL 0  . iron sucrose in sodium chloride 0.9 % 100 mL Inject into the vein.    [name] JANUVIA 25 MG tablet TAKE 1 TABLET BY MOUTH EVERY DAY 30 tablet 3  . LOPERAMIDE HCL PO Take by mouth.    . metoprolol tartrate (LOPRESSOR) 25 MG tablet Take 1 tablet (25 mg total) by mouth 2 (two) times daily. 60 tablet 0  . rosuvastatin (CRESTOR) 20 MG tablet TAKE 1 TABLET BY MOUTH EVERY DAY 90 tablet 3  . sevelamer carbonate (RENVELA) 800 MG tablet Take 1,600 mg by mouth See admin instructions. Take 1,600 mg by mouth with meals (when eating)    . sodium bicarbonate 650 MG tablet Take 2 tablets (1,300 mg total) by mouth 3 (three)  times daily. 90 tablet 0  . venlafaxine XR (EFFEXOR-XR) 37.5 MG 24 hr capsule Take 1 capsule (37.5 mg total) by mouth at bedtime. 90 capsule 1  . gabapentin (NEURONTIN) 300 MG capsule 300 mg after dialysis only on dialysis days (Patient taking differently: Take by mouth 2 (two) times daily. 300 mg after dialysis only on dialysis days) 36 capsule 3  . Methoxy PEG-Epoetin Beta (MIRCERA IJ) Mircera    . vitamin B-12 (CYANOCOBALAMIN) 1000 MCG tablet Take 1 tablet (1,000 mcg total) by mouth daily. 30 tablet 0   No facility-administered medications prior to visit.     Per HPI unless specifically indicated in ROS section below Review of Systems  Constitutional: Negative for fatigue and fever.  HENT: Negative for congestion.   Eyes: Negative for pain.  Respiratory: Negative for cough and shortness of breath.   Cardiovascular: Negative for  chest pain, palpitations and leg swelling.  Gastrointestinal: Negative for abdominal pain.  Genitourinary: Negative for dysuria and vaginal bleeding.  Musculoskeletal: Positive for arthralgias and back pain.  Neurological: Negative for syncope, light-headedness and headaches.  Psychiatric/Behavioral: Negative for dysphoric mood.   Objective:  BP (!) 150/70   Pulse 78   Temp 98.2 F (36.8 C) (Temporal)   Ht 5' 1.5" (1.562 m)   Wt 112 lb 12 oz (51.1 kg)   SpO2 95%   BMI 20.96 kg/m   Wt Readings from Last 3 Encounters:  12/08/20 112 lb 12 oz (51.1 kg)  10/25/20 107 lb 2.3 oz (48.6 kg)  09/05/20 114 lb 3.2 oz (51.8 kg)      Physical Exam Constitutional:      General: She is not in acute distress.Vital signs are normal.     Appearance: Normal appearance. She is well-developed and well-nourished. She is not ill-appearing or toxic-appearing.  HENT:     Head: Normocephalic.     Right Ear: Hearing, tympanic membrane, ear canal and external ear normal. Tympanic membrane is not erythematous, retracted or bulging.     Left Ear: Hearing, tympanic membrane, ear canal and external ear normal. Tympanic membrane is not erythematous, retracted or bulging.     Nose: No mucosal edema or rhinorrhea.     Right Sinus: No maxillary sinus tenderness or frontal sinus tenderness.     Left Sinus: No maxillary sinus tenderness or frontal sinus tenderness.     Mouth/Throat:     Mouth: Oropharynx is clear and moist and mucous membranes are normal.     Pharynx: Uvula midline.  Eyes:     General: Lids are normal. Lids are everted, no foreign bodies appreciated.     Extraocular Movements: EOM normal.     Conjunctiva/sclera: Conjunctivae normal.     Pupils: Pupils are equal, round, and reactive to light.  Neck:     Thyroid: No thyroid mass or thyromegaly.     Vascular: No carotid bruit.     Trachea: Trachea normal.  Cardiovascular:     Rate and Rhythm: Normal rate and regular rhythm.     Pulses: Normal  pulses and intact distal pulses.     Heart sounds: Normal heart sounds, S1 normal and S2 normal. No murmur heard. No friction rub. No gallop.   Pulmonary:     Effort: Pulmonary effort is normal. No tachypnea or respiratory distress.     Breath sounds: Normal breath sounds. No decreased breath sounds, wheezing, rhonchi or rales.  Abdominal:     General: Bowel sounds are normal.     Palpations: Abdomen  is soft.     Tenderness: There is no abdominal tenderness.  Musculoskeletal:     Cervical back: Normal range of motion and neck supple.     Lumbar back: Tenderness present. No bony tenderness. Decreased range of motion.     Left knee: Deformity present. No swelling. Decreased range of motion. Tenderness present over the medial joint line and lateral joint line.  Skin:    General: Skin is warm, dry and intact.     Findings: No rash.  Neurological:     Mental Status: She is alert.  Psychiatric:        Mood and Affect: Mood is not anxious or depressed.        Speech: Speech normal.        Behavior: Behavior normal. Behavior is cooperative.        Thought Content: Thought content normal.        Cognition and Memory: Cognition and memory normal.        Judgment: Judgment normal.       Results for orders placed or performed during the hospital encounter of 10/22/20  Blood culture (routine single)   Specimen: BLOOD RIGHT FOREARM  Result Value Ref Range   Specimen Description BLOOD RIGHT FOREARM    Special Requests      BOTTLES DRAWN AEROBIC AND ANAEROBIC Blood Culture results may not be optimal due to an inadequate volume of blood received in culture bottles   Culture      NO GROWTH 5 DAYS Performed at [facility], [place] [address] [place], [place] [address]    Report Status 10/27/2020 FINAL   Resp Panel by RT-PCR (Flu A&B, Covid) Nasopharyngeal Swab   Specimen: Nasopharyngeal Swab; Nasopharyngeal(NP) swabs in vial transport medium  Result Value Ref Range   SARS Coronavirus 2  by RT PCR NEGATIVE NEGATIVE   Influenza A by PCR NEGATIVE NEGATIVE   Influenza B by PCR NEGATIVE NEGATIVE  Basic metabolic panel  Result Value Ref Range   Sodium 136 135 - 145 mmol/L   Potassium 7.2 (HH) 3.5 - 5.1 mmol/L   Chloride 105 98 - 111 mmol/L   CO2 10 (L) 22 - 32 mmol/L   Glucose, Bld 166 (H) 70 - 99 mg/dL   BUN 150 (H) 8 - 23 mg/dL   Creatinine, Ser 10.61 (H) 0.44 - 1.00 mg/dL   Calcium 7.9 (L) 8.9 - 10.3 mg/dL   GFR, Estimated 4 (L) >60 mL/min   Anion gap 21 (H) 5 - 15  CBC  Result Value Ref Range   WBC 6.1 4.0 - 10.5 K/uL   RBC 3.08 (L) 3.87 - 5.11 MIL/uL   Hemoglobin 10.1 (L) 12.0 - 15.0 g/dL   HCT 31.5 (L) 36.0 - 46.0 %   MCV 102.3 (H) 80.0 - 100.0 fL   MCH 32.8 26.0 - 34.0 pg   MCHC 32.1 30.0 - 36.0 g/dL   RDW 15.3 11.5 - 15.5 %   Platelets 166 150 - 400 K/uL   nRBC 0.0 0.0 - 0.2 %  Magnesium  Result Value Ref Range   Magnesium 2.0 1.7 - 2.4 mg/dL  Phosphorus  Result Value Ref Range   Phosphorus 8.5 (H) 2.5 - 4.6 mg/dL  Lactic acid, plasma  Result Value Ref Range   Lactic Acid, Venous 0.8 0.5 - 1.9 mmol/L  Lactic acid, plasma  Result Value Ref Range   Lactic Acid, Venous 1.3 0.5 - 1.9 mmol/L  Protime-INR  Result Value Ref Range  Prothrombin Time 14.7 11.4 - 15.2 seconds   INR 1.2 0.8 - 1.2  APTT  Result Value Ref Range   aPTT 35 24 - 36 seconds  Basic metabolic panel  Result Value Ref Range   Sodium 140 135 - 145 mmol/L   Potassium 6.4 (HH) 3.5 - 5.1 mmol/L   Chloride 105 98 - 111 mmol/L   CO2 14 (L) 22 - 32 mmol/L   Glucose, Bld 103 (H) 70 - 99 mg/dL   BUN 153 (H) 8 - 23 mg/dL   Creatinine, Ser 10.97 (H) 0.44 - 1.00 mg/dL   Calcium 7.9 (L) 8.9 - 10.3 mg/dL   GFR, Estimated 3 (L) >60 mL/min   Anion gap 21 (H) 5 - 15  Basic metabolic panel  Result Value Ref Range   Sodium 142 135 - 145 mmol/L   Potassium 4.6 3.5 - 5.1 mmol/L   Chloride 104 98 - 111 mmol/L   CO2 13 (L) 22 - 32 mmol/L   Glucose, Bld 94 70 - 99 mg/dL   BUN 148 (H) 8 - 23  mg/dL   Creatinine, Ser 10.86 (H) 0.44 - 1.00 mg/dL   Calcium 7.5 (L) 8.9 - 10.3 mg/dL   GFR, Estimated 3 (L) >60 mL/min   Anion gap 25 (H) 5 - 15  CBC  Result Value Ref Range   WBC 5.0 4.0 - 10.5 K/uL   RBC 2.86 (L) 3.87 - 5.11 MIL/uL   Hemoglobin 9.1 (L) 12.0 - 15.0 g/dL   HCT 28.2 (L) 36.0 - 46.0 %   MCV 98.6 80.0 - 100.0 fL   MCH 31.8 26.0 - 34.0 pg   MCHC 32.3 30.0 - 36.0 g/dL   RDW 15.4 11.5 - 15.5 %   Platelets 177 150 - 400 K/uL   nRBC 0.0 0.0 - 0.2 %  Basic metabolic panel  Result Value Ref Range   Sodium 141 135 - 145 mmol/L   Potassium 4.7 3.5 - 5.1 mmol/L   Chloride 106 98 - 111 mmol/L   CO2 11 (L) 22 - 32 mmol/L   Glucose, Bld 282 (H) 70 - 99 mg/dL   BUN 151 (H) 8 - 23 mg/dL   Creatinine, Ser 11.00 (H) 0.44 - 1.00 mg/dL   Calcium 7.7 (L) 8.9 - 10.3 mg/dL   GFR, Estimated 3 (L) >60 mL/min   Anion gap 24 (H) 5 - 15  Glucose, capillary  Result Value Ref Range   Glucose-Capillary 93 70 - 99 mg/dL  Glucose, capillary  Result Value Ref Range   Glucose-Capillary 137 (H) 70 - 99 mg/dL  Basic metabolic panel  Result Value Ref Range   Sodium 139 135 - 145 mmol/L   Potassium 3.0 (L) 3.5 - 5.1 mmol/L   Chloride 98 98 - 111 mmol/L   CO2 25 22 - 32 mmol/L   Glucose, Bld 96 70 - 99 mg/dL   BUN 40 (H) 8 - 23 mg/dL   Creatinine, Ser 5.49 (H) 0.44 - 1.00 mg/dL   Calcium 7.3 (L) 8.9 - 10.3 mg/dL   GFR, Estimated 8 (L) >60 mL/min   Anion gap 16 (H) 5 - 15  CBC  Result Value Ref Range   WBC 3.8 (L) 4.0 - 10.5 K/uL   RBC 2.66 (L) 3.87 - 5.11 MIL/uL   Hemoglobin 8.4 (L) 12.0 - 15.0 g/dL   HCT 26.1 (L) 36.0 - 46.0 %   MCV 98.1 80.0 - 100.0 fL   MCH 31.6 26.0 - 34.0 pg  MCHC 32.2 30.0 - 36.0 g/dL   RDW 14.7 11.5 - 15.5 %   Platelets 139 (L) 150 - 400 K/uL   nRBC 0.0 0.0 - 0.2 %  Glucose, capillary  Result Value Ref Range   Glucose-Capillary 84 70 - 99 mg/dL  Hepatic function panel  Result Value Ref Range   Total Protein 5.8 (L) 6.5 - 8.1 g/dL   Albumin 3.0 (L)  3.5 - 5.0 g/dL   AST 24 15 - 41 U/L   ALT 14 0 - 44 U/L   Alkaline Phosphatase 58 38 - 126 U/L   Total Bilirubin 0.9 0.3 - 1.2 mg/dL   Bilirubin, Direct <0.1 0.0 - 0.2 mg/dL   Indirect Bilirubin NOT CALCULATED 0.3 - 0.9 mg/dL  Glucose, capillary  Result Value Ref Range   Glucose-Capillary 99 70 - 99 mg/dL  Glucose, capillary  Result Value Ref Range   Glucose-Capillary 95 70 - 99 mg/dL  Basic metabolic panel  Result Value Ref Range   Sodium 138 135 - 145 mmol/L   Potassium 2.7 (LL) 3.5 - 5.1 mmol/L   Chloride 97 (L) 98 - 111 mmol/L   CO2 26 22 - 32 mmol/L   Glucose, Bld 86 70 - 99 mg/dL   BUN 50 (H) 8 - 23 mg/dL   Creatinine, Ser 6.83 (H) 0.44 - 1.00 mg/dL   Calcium 6.9 (L) 8.9 - 10.3 mg/dL   GFR, Estimated 6 (L) >60 mL/min   Anion gap 15 5 - 15  CBC  Result Value Ref Range   WBC 3.6 (L) 4.0 - 10.5 K/uL   RBC 2.52 (L) 3.87 - 5.11 MIL/uL   Hemoglobin 8.1 (L) 12.0 - 15.0 g/dL   HCT 24.3 (L) 36.0 - 46.0 %   MCV 96.4 80.0 - 100.0 fL   MCH 32.1 26.0 - 34.0 pg   MCHC 33.3 30.0 - 36.0 g/dL   RDW 13.8 11.5 - 15.5 %   Platelets 135 (L) 150 - 400 K/uL   nRBC 0.0 0.0 - 0.2 %  Glucose, capillary  Result Value Ref Range   Glucose-Capillary 104 (H) 70 - 99 mg/dL   Comment 1 Notify RN    Comment 2 Document in Chart   Glucose, capillary  Result Value Ref Range   Glucose-Capillary 89 70 - 99 mg/dL   Comment 1 Notify RN    Comment 2 Document in Chart   Hepatitis B surface antigen  Result Value Ref Range   Hepatitis B Surface Ag NON REACTIVE NON REACTIVE  Glucose, capillary  Result Value Ref Range   Glucose-Capillary 72 70 - 99 mg/dL  Basic metabolic panel  Result Value Ref Range   Sodium 139 135 - 145 mmol/L   Potassium 3.7 3.5 - 5.1 mmol/L   Chloride 100 98 - 111 mmol/L   CO2 27 22 - 32 mmol/L   Glucose, Bld 105 (H) 70 - 99 mg/dL   BUN 12 8 - 23 mg/dL   Creatinine, Ser 2.94 (H) 0.44 - 1.00 mg/dL   Calcium 7.9 (L) 8.9 - 10.3 mg/dL   GFR, Estimated 16 (L) >60 mL/min    Anion gap 12 5 - 15  CBG monitoring, ED  Result Value Ref Range   Glucose-Capillary 150 (H) 70 - 99 mg/dL   Comment 1 Notify RN    Comment 2 Document in Chart   I-Stat venous blood gas, [facility] ED)  Result Value Ref Range   pH, Ven 7.419 7.250 - 7.430   pCO2, Ven  22.2 (L) 44.0 - 60.0 mmHg   pO2, Ven 206.0 (H) 32.0 - 45.0 mmHg   Bicarbonate 14.4 (L) 20.0 - 28.0 mmol/L   TCO2 15 (L) 22 - 32 mmol/L   O2 Saturation 100.0 %   Acid-base deficit 9.0 (H) 0.0 - 2.0 mmol/L   Sodium 139 135 - 145 mmol/L   Potassium 6.0 (H) 3.5 - 5.1 mmol/L   Calcium, Ion 0.87 (LL) 1.15 - 1.40 mmol/L   HCT 25.0 (L) 36.0 - 46.0 %   Hemoglobin 8.5 (L) 12.0 - 15.0 g/dL   Sample type VENOUS    Comment NOTIFIED PHYSICIAN   CBG monitoring, ED  Result Value Ref Range   Glucose-Capillary 273 (H) 70 - 99 mg/dL    This visit occurred during the SARS-CoV-2 public health emergency.  Safety protocols were in place, including screening questions prior to the visit, additional usage of staff PPE, and extensive cleaning of exam room while observing appropriate contact time as indicated for disinfecting solutions.   COVID 19 screen:  No recent travel or known exposure to COVID19 The patient denies respiratory symptoms of COVID 19 at this time. The importance of social distancing was discussed today.   Assessment and Plan    Problem List Items Addressed This Visit    Acute midline low back pain without sciatica    No clear indication for  X-ray.  Diffuse low back pain.[name] NSAIDs contraindicated.  Start home PT, heat and use tramadol low dose prn pain.      Relevant Medications   traMADol (ULTRAM) 50 MG tablet   ESRD (end stage renal disease) (HCC) (Chronic)    Encouraged compliance with dialysis.      Primary osteoarthritis of left knee    Failed tylenol, diclofenac gel. NSAIDs contraindicated.  offered referral to [facility] for possible steroid injection.. refused.  Will try tramadol low dose prn pain.      Relevant  Medications   traMADol (ULTRAM) 50 MG tablet   Type 2 diabetes with nephropathy ([place]) - Primary    Stable control on low dose [place].  No lows.         Meds ordered this encounter  Medications  . traMADol (ULTRAM) 50 MG tablet    Sig: Take 0.5 tablets (25 mg total) by mouth 2 (two) times daily as needed.    Dispense:  30 tablet    Refill:  0   Orders Placed This Encounter  Procedures  . POCT glycosylated hemoglobin (Hb A1C)      Eliezer Lofts, MD

## 2020-12-08 NOTE — Assessment & Plan Note (Signed)
Stable control on low dose [place].  No lows.

## 2020-12-08 NOTE — Assessment & Plan Note (Signed)
Failed tylenol, diclofenac gel. NSAIDs contraindicated.  offered referral to [facility] for possible steroid injection.. refused.  Will try tramadol low dose prn pain.

## 2020-12-09 DIAGNOSIS — T8249XA Other complication of vascular dialysis catheter, initial encounter: Secondary | ICD-10-CM | POA: Diagnosis not present

## 2020-12-09 DIAGNOSIS — Z992 Dependence on renal dialysis: Secondary | ICD-10-CM | POA: Diagnosis not present

## 2020-12-09 DIAGNOSIS — D689 Coagulation defect, unspecified: Secondary | ICD-10-CM | POA: Diagnosis not present

## 2020-12-09 DIAGNOSIS — R197 Diarrhea, unspecified: Secondary | ICD-10-CM | POA: Diagnosis not present

## 2020-12-09 DIAGNOSIS — D688 Other specified coagulation defects: Secondary | ICD-10-CM | POA: Diagnosis not present

## 2020-12-09 DIAGNOSIS — N186 End stage renal disease: Secondary | ICD-10-CM | POA: Diagnosis not present

## 2020-12-09 DIAGNOSIS — N2581 Secondary hyperparathyroidism of renal origin: Secondary | ICD-10-CM | POA: Diagnosis not present

## 2020-12-12 DIAGNOSIS — D688 Other specified coagulation defects: Secondary | ICD-10-CM | POA: Diagnosis not present

## 2020-12-12 DIAGNOSIS — D689 Coagulation defect, unspecified: Secondary | ICD-10-CM | POA: Diagnosis not present

## 2020-12-12 DIAGNOSIS — N2581 Secondary hyperparathyroidism of renal origin: Secondary | ICD-10-CM | POA: Diagnosis not present

## 2020-12-12 DIAGNOSIS — N186 End stage renal disease: Secondary | ICD-10-CM | POA: Diagnosis not present

## 2020-12-12 DIAGNOSIS — T8249XA Other complication of vascular dialysis catheter, initial encounter: Secondary | ICD-10-CM | POA: Diagnosis not present

## 2020-12-12 DIAGNOSIS — Z992 Dependence on renal dialysis: Secondary | ICD-10-CM | POA: Diagnosis not present

## 2020-12-15 ENCOUNTER — Other Ambulatory Visit: Payer: Self-pay

## 2020-12-15 ENCOUNTER — Other Ambulatory Visit: Payer: Self-pay | Admitting: *Deleted

## 2020-12-15 IMAGING — CT CT HEAD CODE STROKE
4 of 6 series · 15 of 47 positions shown, 16 images · non-contrast
Comparison: Brain MRI 08/16/2020.

CLINICAL DATA: Code stroke. Neuro deficit, acute, stroke suspected.
Altered mental status, aphasia.

EXAM:
CT HEAD WITHOUT CONTRAST
TECHNIQUE: Contiguous axial images were obtained from the base of the skull
through the vertex without intravenous contrast.

[Series 5: head 5.0 st · axial · 0.41mm/px · z∈[+1290,+1360]mm · 3 of 29 slices shown, 4 images]
[im 8/29  brain]
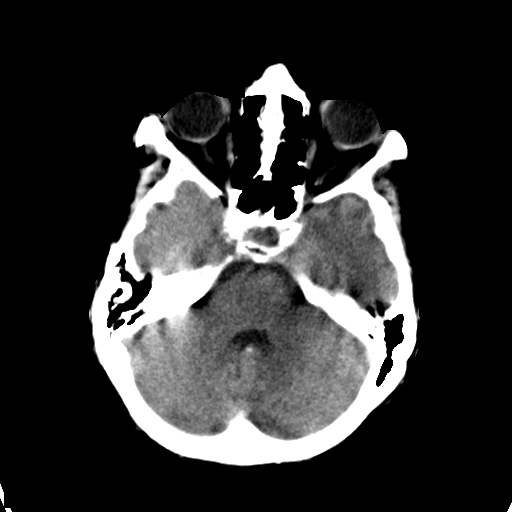
[im 8/29  bone]
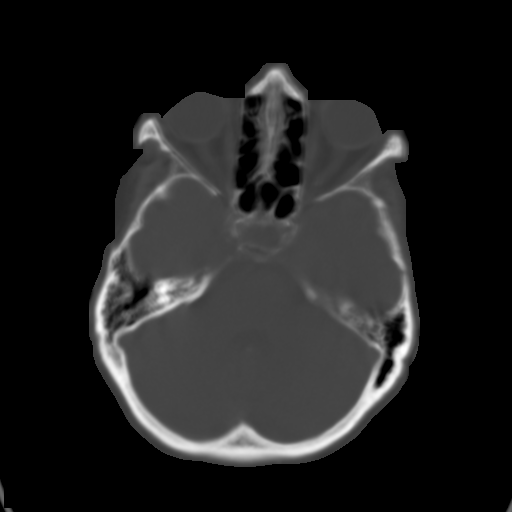
[im 15/29  brain]
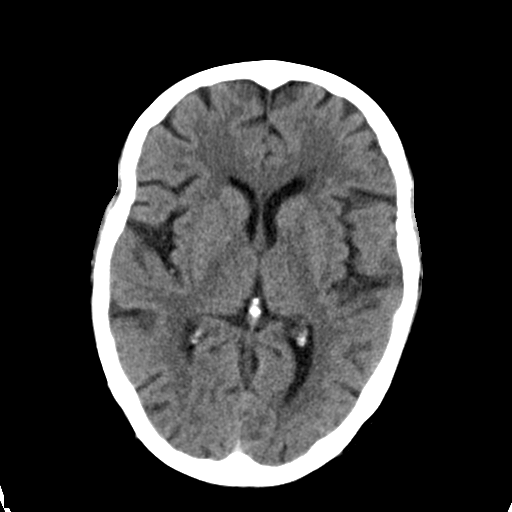
[im 22/29  brain]
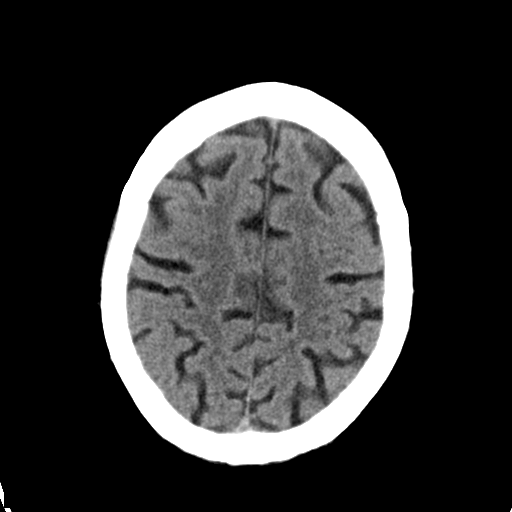

[Series 6: head 2.0 bone · axial · 0.41mm/px · z∈[+1267,+1351]mm · 6 of 73 slices shown]
[im 7/73  bone]
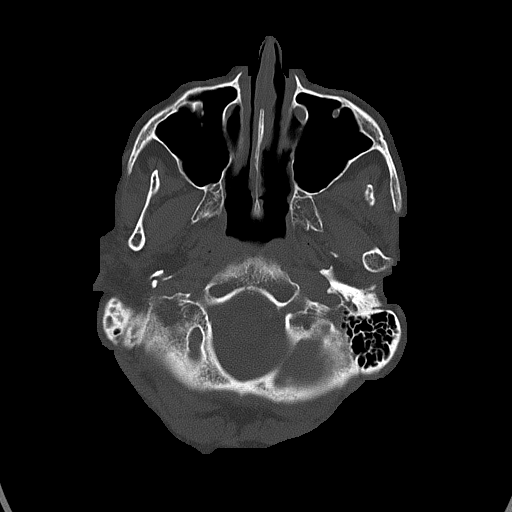
[im 19/73  bone]
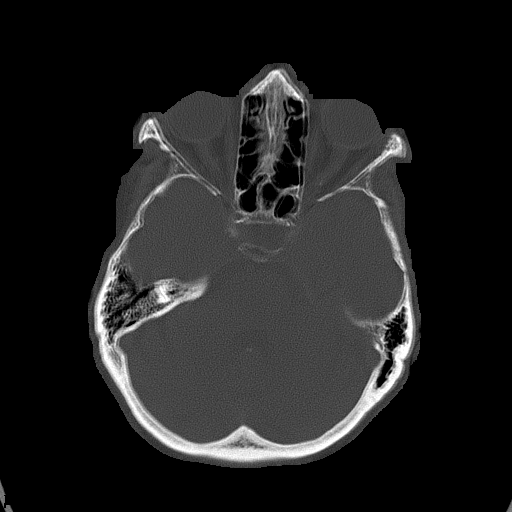
[im 25/73  bone]
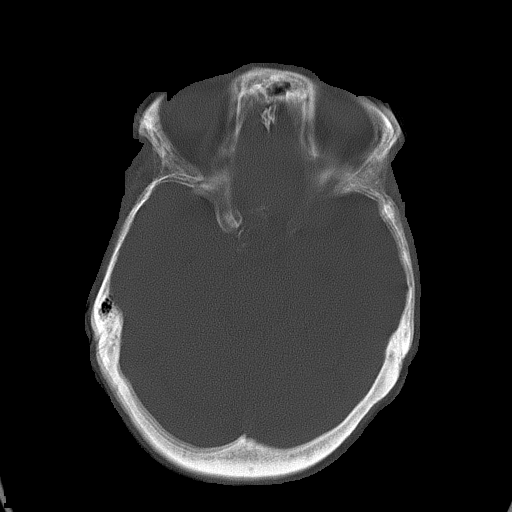
[im 31/73  bone]
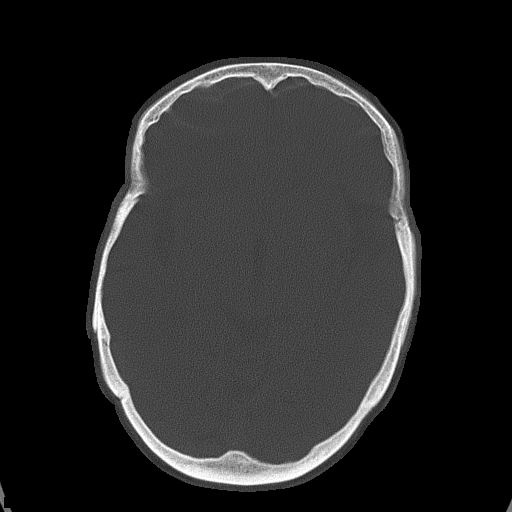
[im 43/73  bone]
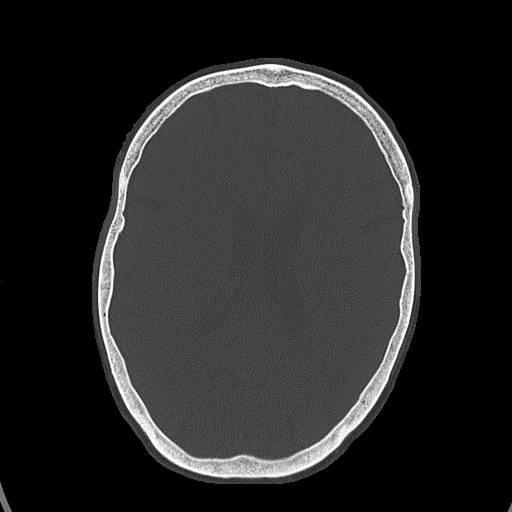
[im 49/73  bone]
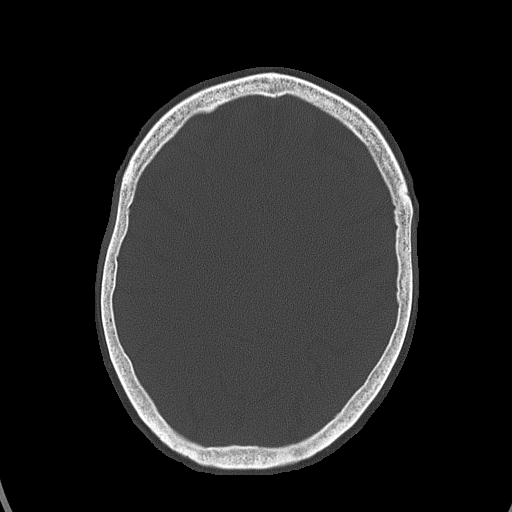

[Series 7: head 3.0 cor st · coronal · 0.31mm/px · 3 of 67 slices shown]
[im 23/67  brain]
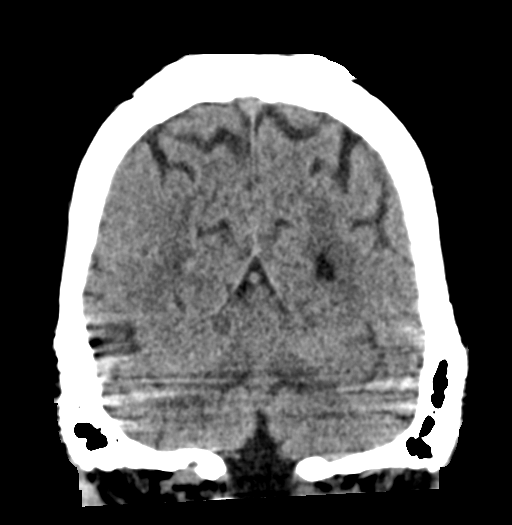
[im 30/67  brain]
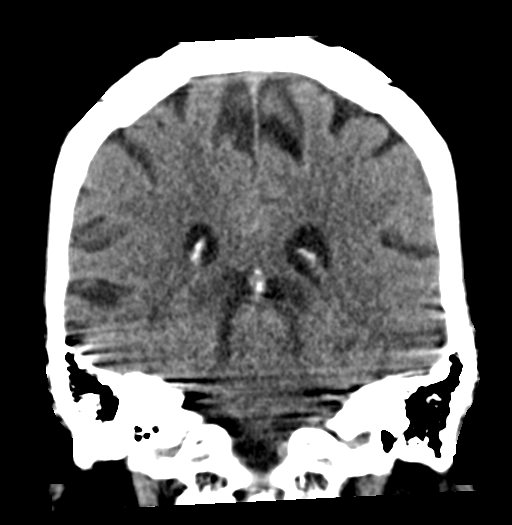
[im 37/67  brain]
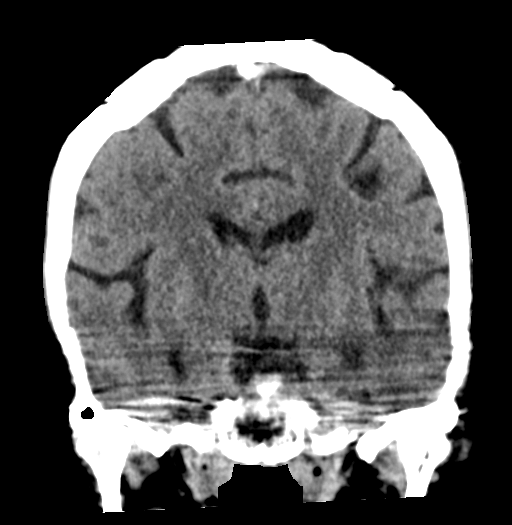

[Series 8: head 3.0 sag st · sagittal · 0.31mm/px · 3 of 57 slices shown]
[im 19/57  brain]
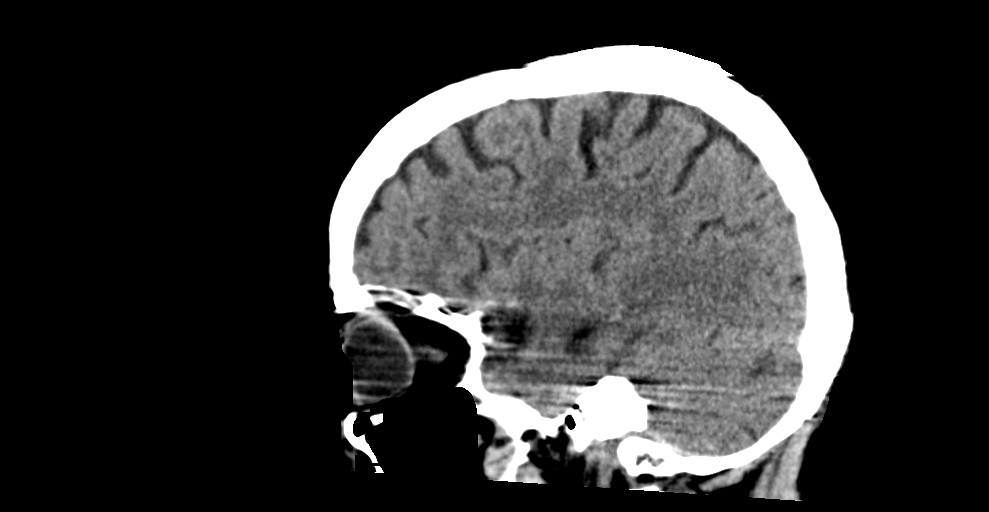
[im 29/57  brain]
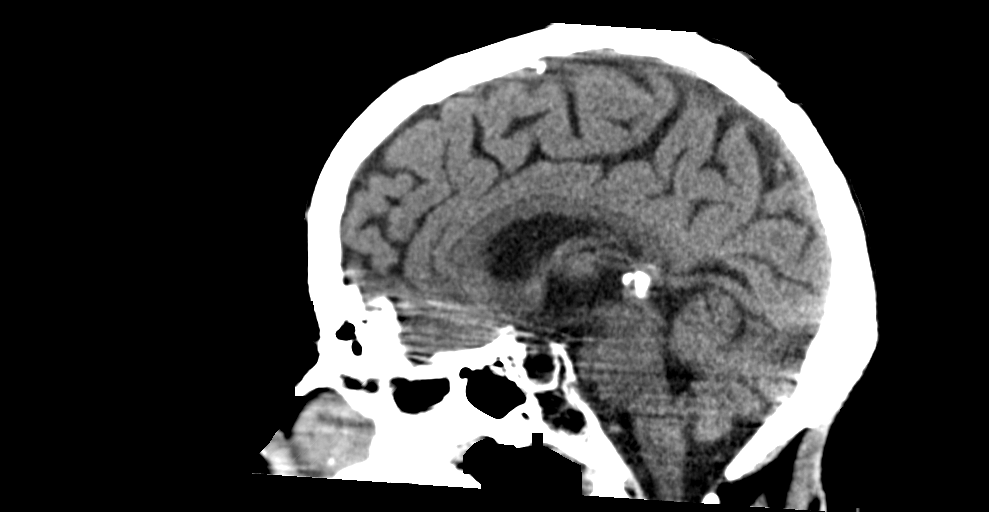
[im 38/57  brain]
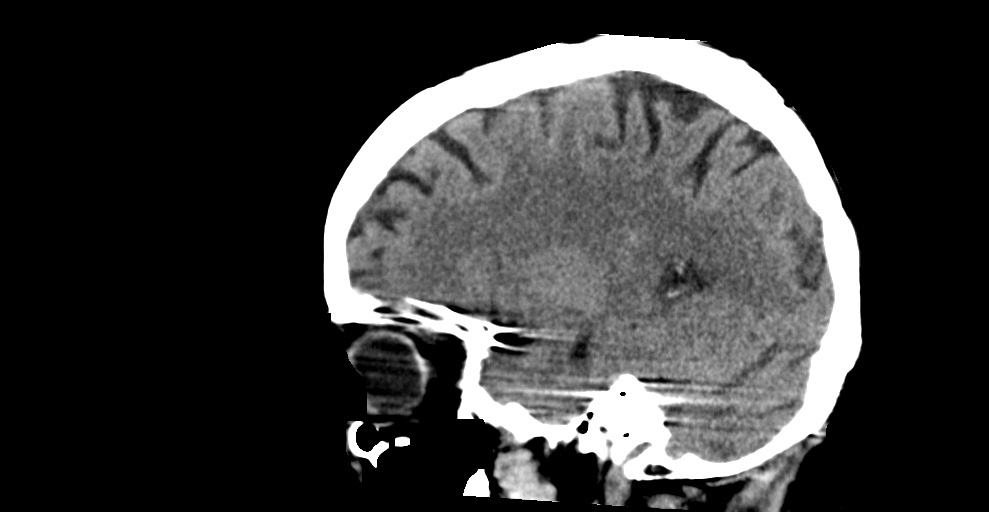

[15 of 47 positions shown; findings below may reference images not displayed]

FINDINGS: Brain:

Mildly motion degraded examination.

Mild generalized cerebral atrophy.

Mild periventricular ill-defined hypoattenuation is nonspecific, but
compatible with chronic small vessel ischemic disease.

Redemonstrated prominent perivascular space versus small chronic
lacunar infarct within the left thalamus (series 5, image 14).

There is no acute intracranial hemorrhage.

No demarcated cortical infarct.

No extra-axial fluid collection.

No evidence of intracranial mass.

No midline shift.

Vascular: No hyperdense vessel.  Atherosclerotic calcifications.

Skull: Normal. Negative for fracture or focal lesion.

Sinuses/Orbits: Visualized orbits show no acute finding.

ASPECTS (Alberta Stroke Program Early CT Score)

- Ganglionic level infarction (caudate, lentiform nuclei, internal
capsule, insula, M1-M3 cortex): 7

- Supraganglionic infarction (M4-M6 cortex): 3

Total score (0-10 with 10 being normal): 10

These results were communicated to Dr. Imo Casas Comissioner At [DATE] pmon
09/05/2020by text page via the AMION messaging system.
IMPRESSION: Mildly motion degraded examination.

No evidence of acute intracranial abnormality.  ASPECTS is 10.

Mild cerebral atrophy and chronic small vessel ischemic disease.

Redemonstrated prominent perivascular space versus chronic lacunar
infarct within the left thalamus.

## 2020-12-15 NOTE — Patient Outreach (Signed)
[place] [facility]) Care Management  12/15/2020  Cindy Burns 1946-03-14 [id number]   [facility] Telephone Assessment/Screen for MD, post hospital referral  Referral Date:10/26/20 Referral Source:THN hospital liaison, V Brewer Referral Reason:complex care and disease management follow up calls and assess for further needs Insurance:united [place] (united healthcare dual complete (HMO SNP)   Outreach attempt successful Patient is able to verify HIPAA, DOB and address Reviewed and addressed referral to [facility] patient Consent: [facility] RN CM reviewed [facility] services with patient. Patient gave verbal consent for services [facility] telephonic RN CM.     DMEHas not received a new over the counter (OTC)from[facility] Health [facility]) to get assist with a wide base cane to prevent further falls Friend has not been able to successful assist her with getting a ramp by consulting a church and habitat    Hypertension(HTN) Received her over the counter (OTC) catalog and reviewed the available BP cuff with her pcp and she states her pcp does not like any of the available BP cuffs in the catalog Education provided related hypotension, systolic and diastolic    Nutrition She reports she has spoken with a dietitian related to fluid restriction and better choices of foods  She enjoys intake of potatoes and  tomatoes  Discussed sodium bicarbonate to help keep kidney disease from getting worse  Discussed moderation related to food intake She discussed her concern with being told she should not be driving herself and she developed an action plan She has setup [employer] transportation service independently  [employer] delivered 3 x week 11-12  Loves [place] biscuit    Ramp- Still without a resolution  Agrees to a care guide referral   Plans [facility] RN CM will follow up with patient within the next 14-21 business days Pt encouraged to return a  call to [place] CM prn    Yulian Gosney L. [name], RN, BSN, [place] Coordinator Office number [phone] Main [facility] number [phone] Fax number [phone]

## 2020-12-16 DIAGNOSIS — D689 Coagulation defect, unspecified: Secondary | ICD-10-CM | POA: Diagnosis not present

## 2020-12-16 DIAGNOSIS — T8249XA Other complication of vascular dialysis catheter, initial encounter: Secondary | ICD-10-CM | POA: Diagnosis not present

## 2020-12-16 DIAGNOSIS — D688 Other specified coagulation defects: Secondary | ICD-10-CM | POA: Diagnosis not present

## 2020-12-16 DIAGNOSIS — Z992 Dependence on renal dialysis: Secondary | ICD-10-CM | POA: Diagnosis not present

## 2020-12-16 DIAGNOSIS — N2581 Secondary hyperparathyroidism of renal origin: Secondary | ICD-10-CM | POA: Diagnosis not present

## 2020-12-16 DIAGNOSIS — N186 End stage renal disease: Secondary | ICD-10-CM | POA: Diagnosis not present

## 2020-12-19 DIAGNOSIS — N186 End stage renal disease: Secondary | ICD-10-CM | POA: Diagnosis not present

## 2020-12-19 DIAGNOSIS — D689 Coagulation defect, unspecified: Secondary | ICD-10-CM | POA: Diagnosis not present

## 2020-12-19 DIAGNOSIS — M25572 Pain in left ankle and joints of left foot: Secondary | ICD-10-CM | POA: Diagnosis not present

## 2020-12-19 DIAGNOSIS — Z992 Dependence on renal dialysis: Secondary | ICD-10-CM | POA: Diagnosis not present

## 2020-12-19 DIAGNOSIS — D509 Iron deficiency anemia, unspecified: Secondary | ICD-10-CM | POA: Diagnosis not present

## 2020-12-19 DIAGNOSIS — N2581 Secondary hyperparathyroidism of renal origin: Secondary | ICD-10-CM | POA: Diagnosis not present

## 2020-12-19 DIAGNOSIS — T8249XA Other complication of vascular dialysis catheter, initial encounter: Secondary | ICD-10-CM | POA: Diagnosis not present

## 2020-12-19 DIAGNOSIS — J449 Chronic obstructive pulmonary disease, unspecified: Secondary | ICD-10-CM | POA: Diagnosis not present

## 2020-12-19 DIAGNOSIS — D688 Other specified coagulation defects: Secondary | ICD-10-CM | POA: Diagnosis not present

## 2020-12-19 DIAGNOSIS — M25472 Effusion, left ankle: Secondary | ICD-10-CM | POA: Diagnosis not present

## 2020-12-19 DIAGNOSIS — R197 Diarrhea, unspecified: Secondary | ICD-10-CM | POA: Diagnosis not present

## 2020-12-20 LAB — HM DIABETES EYE EXAM

## 2020-12-21 DIAGNOSIS — Z992 Dependence on renal dialysis: Secondary | ICD-10-CM | POA: Diagnosis not present

## 2020-12-21 DIAGNOSIS — N2581 Secondary hyperparathyroidism of renal origin: Secondary | ICD-10-CM | POA: Diagnosis not present

## 2020-12-21 DIAGNOSIS — D688 Other specified coagulation defects: Secondary | ICD-10-CM | POA: Diagnosis not present

## 2020-12-21 DIAGNOSIS — D509 Iron deficiency anemia, unspecified: Secondary | ICD-10-CM | POA: Diagnosis not present

## 2020-12-21 DIAGNOSIS — R197 Diarrhea, unspecified: Secondary | ICD-10-CM | POA: Diagnosis not present

## 2020-12-21 DIAGNOSIS — D689 Coagulation defect, unspecified: Secondary | ICD-10-CM | POA: Diagnosis not present

## 2020-12-21 DIAGNOSIS — T8249XA Other complication of vascular dialysis catheter, initial encounter: Secondary | ICD-10-CM | POA: Diagnosis not present

## 2020-12-21 DIAGNOSIS — N186 End stage renal disease: Secondary | ICD-10-CM | POA: Diagnosis not present

## 2020-12-23 DIAGNOSIS — D689 Coagulation defect, unspecified: Secondary | ICD-10-CM | POA: Diagnosis not present

## 2020-12-23 DIAGNOSIS — N2581 Secondary hyperparathyroidism of renal origin: Secondary | ICD-10-CM | POA: Diagnosis not present

## 2020-12-23 DIAGNOSIS — N186 End stage renal disease: Secondary | ICD-10-CM | POA: Diagnosis not present

## 2020-12-23 DIAGNOSIS — D688 Other specified coagulation defects: Secondary | ICD-10-CM | POA: Diagnosis not present

## 2020-12-23 DIAGNOSIS — D509 Iron deficiency anemia, unspecified: Secondary | ICD-10-CM | POA: Diagnosis not present

## 2020-12-23 DIAGNOSIS — Z992 Dependence on renal dialysis: Secondary | ICD-10-CM | POA: Diagnosis not present

## 2020-12-23 DIAGNOSIS — T8249XA Other complication of vascular dialysis catheter, initial encounter: Secondary | ICD-10-CM | POA: Diagnosis not present

## 2020-12-23 DIAGNOSIS — R197 Diarrhea, unspecified: Secondary | ICD-10-CM | POA: Diagnosis not present

## 2020-12-26 DIAGNOSIS — D689 Coagulation defect, unspecified: Secondary | ICD-10-CM | POA: Diagnosis not present

## 2020-12-26 DIAGNOSIS — Z992 Dependence on renal dialysis: Secondary | ICD-10-CM | POA: Diagnosis not present

## 2020-12-26 DIAGNOSIS — D688 Other specified coagulation defects: Secondary | ICD-10-CM | POA: Diagnosis not present

## 2020-12-26 DIAGNOSIS — D509 Iron deficiency anemia, unspecified: Secondary | ICD-10-CM | POA: Diagnosis not present

## 2020-12-26 DIAGNOSIS — N2581 Secondary hyperparathyroidism of renal origin: Secondary | ICD-10-CM | POA: Diagnosis not present

## 2020-12-26 DIAGNOSIS — R197 Diarrhea, unspecified: Secondary | ICD-10-CM | POA: Diagnosis not present

## 2020-12-26 DIAGNOSIS — N186 End stage renal disease: Secondary | ICD-10-CM | POA: Diagnosis not present

## 2020-12-26 DIAGNOSIS — T8249XA Other complication of vascular dialysis catheter, initial encounter: Secondary | ICD-10-CM | POA: Diagnosis not present

## 2020-12-28 DIAGNOSIS — Z992 Dependence on renal dialysis: Secondary | ICD-10-CM | POA: Diagnosis not present

## 2020-12-28 DIAGNOSIS — R197 Diarrhea, unspecified: Secondary | ICD-10-CM | POA: Diagnosis not present

## 2020-12-28 DIAGNOSIS — D689 Coagulation defect, unspecified: Secondary | ICD-10-CM | POA: Diagnosis not present

## 2020-12-28 DIAGNOSIS — N2581 Secondary hyperparathyroidism of renal origin: Secondary | ICD-10-CM | POA: Diagnosis not present

## 2020-12-28 DIAGNOSIS — D509 Iron deficiency anemia, unspecified: Secondary | ICD-10-CM | POA: Diagnosis not present

## 2020-12-28 DIAGNOSIS — T8249XA Other complication of vascular dialysis catheter, initial encounter: Secondary | ICD-10-CM | POA: Diagnosis not present

## 2020-12-28 DIAGNOSIS — D688 Other specified coagulation defects: Secondary | ICD-10-CM | POA: Diagnosis not present

## 2020-12-28 DIAGNOSIS — N186 End stage renal disease: Secondary | ICD-10-CM | POA: Diagnosis not present

## 2021-01-01 ENCOUNTER — Other Ambulatory Visit: Payer: Self-pay | Admitting: *Deleted

## 2021-01-01 DIAGNOSIS — E1121 Type 2 diabetes mellitus with diabetic nephropathy: Secondary | ICD-10-CM

## 2021-01-01 DIAGNOSIS — E1159 Type 2 diabetes mellitus with other circulatory complications: Secondary | ICD-10-CM

## 2021-01-01 DIAGNOSIS — N186 End stage renal disease: Secondary | ICD-10-CM

## 2021-01-01 DIAGNOSIS — Z992 Dependence on renal dialysis: Secondary | ICD-10-CM

## 2021-01-01 DIAGNOSIS — J449 Chronic obstructive pulmonary disease, unspecified: Secondary | ICD-10-CM

## 2021-01-01 NOTE — Patient Outreach (Signed)
[place] [facility]) Care Management  01/01/2021  MARVENA TALLY [birth year] [id number]   [place] coordination   Completed referral to [facility] care guide for community resource to assist with home ramp   Troy Hartzog L. [name], RN, BSN, [place] Coordinator Office number [phone] Mobile number [phone]  Main [facility] number [phone] Fax number [phone]

## 2021-01-02 ENCOUNTER — Other Ambulatory Visit: Payer: Self-pay | Admitting: Family Medicine

## 2021-01-02 DIAGNOSIS — D689 Coagulation defect, unspecified: Secondary | ICD-10-CM | POA: Diagnosis not present

## 2021-01-02 DIAGNOSIS — D688 Other specified coagulation defects: Secondary | ICD-10-CM | POA: Diagnosis not present

## 2021-01-02 DIAGNOSIS — T8249XA Other complication of vascular dialysis catheter, initial encounter: Secondary | ICD-10-CM | POA: Diagnosis not present

## 2021-01-02 DIAGNOSIS — N186 End stage renal disease: Secondary | ICD-10-CM | POA: Diagnosis not present

## 2021-01-02 DIAGNOSIS — Z992 Dependence on renal dialysis: Secondary | ICD-10-CM | POA: Diagnosis not present

## 2021-01-02 DIAGNOSIS — D509 Iron deficiency anemia, unspecified: Secondary | ICD-10-CM | POA: Diagnosis not present

## 2021-01-02 DIAGNOSIS — N2581 Secondary hyperparathyroidism of renal origin: Secondary | ICD-10-CM | POA: Diagnosis not present

## 2021-01-02 DIAGNOSIS — R197 Diarrhea, unspecified: Secondary | ICD-10-CM | POA: Diagnosis not present

## 2021-01-04 DIAGNOSIS — N2581 Secondary hyperparathyroidism of renal origin: Secondary | ICD-10-CM | POA: Diagnosis not present

## 2021-01-04 DIAGNOSIS — T8249XA Other complication of vascular dialysis catheter, initial encounter: Secondary | ICD-10-CM | POA: Diagnosis not present

## 2021-01-04 DIAGNOSIS — R197 Diarrhea, unspecified: Secondary | ICD-10-CM | POA: Diagnosis not present

## 2021-01-04 DIAGNOSIS — D509 Iron deficiency anemia, unspecified: Secondary | ICD-10-CM | POA: Diagnosis not present

## 2021-01-04 DIAGNOSIS — D689 Coagulation defect, unspecified: Secondary | ICD-10-CM | POA: Diagnosis not present

## 2021-01-04 DIAGNOSIS — N186 End stage renal disease: Secondary | ICD-10-CM | POA: Diagnosis not present

## 2021-01-04 DIAGNOSIS — D688 Other specified coagulation defects: Secondary | ICD-10-CM | POA: Diagnosis not present

## 2021-01-04 DIAGNOSIS — Z992 Dependence on renal dialysis: Secondary | ICD-10-CM | POA: Diagnosis not present

## 2021-01-04 DIAGNOSIS — N179 Acute kidney failure, unspecified: Secondary | ICD-10-CM | POA: Diagnosis not present

## 2021-01-06 DIAGNOSIS — T8249XA Other complication of vascular dialysis catheter, initial encounter: Secondary | ICD-10-CM | POA: Diagnosis not present

## 2021-01-06 DIAGNOSIS — D689 Coagulation defect, unspecified: Secondary | ICD-10-CM | POA: Diagnosis not present

## 2021-01-06 DIAGNOSIS — D688 Other specified coagulation defects: Secondary | ICD-10-CM | POA: Diagnosis not present

## 2021-01-06 DIAGNOSIS — N186 End stage renal disease: Secondary | ICD-10-CM | POA: Diagnosis not present

## 2021-01-06 DIAGNOSIS — N2581 Secondary hyperparathyroidism of renal origin: Secondary | ICD-10-CM | POA: Diagnosis not present

## 2021-01-06 DIAGNOSIS — Z992 Dependence on renal dialysis: Secondary | ICD-10-CM | POA: Diagnosis not present

## 2021-01-06 DIAGNOSIS — R197 Diarrhea, unspecified: Secondary | ICD-10-CM | POA: Diagnosis not present

## 2021-01-08 ENCOUNTER — Telehealth: Payer: Self-pay

## 2021-01-08 NOTE — Telephone Encounter (Signed)
   Telephone encounter was:  Unsuccessful.  01/08/2021 Name: Cindy Burns MRN: [id number] DOB: April 02, 1946  Unsuccessful outbound call made today to assist with:  wheelchair ramp  Outreach Attempt:  1st Attempt  A HIPAA compliant voice message was left requesting a return call.  Instructed patient to call back at [phone].  Charniece Venturino, AAS Paralegal, [place] . Embedded Care Coordination [facility] Health  Care Management  [address], [place] [address] ??[email or web address]  ?? [phone]   [email or web address]

## 2021-01-09 ENCOUNTER — Telehealth: Payer: Self-pay

## 2021-01-09 DIAGNOSIS — N2581 Secondary hyperparathyroidism of renal origin: Secondary | ICD-10-CM | POA: Diagnosis not present

## 2021-01-09 DIAGNOSIS — T8249XA Other complication of vascular dialysis catheter, initial encounter: Secondary | ICD-10-CM | POA: Diagnosis not present

## 2021-01-09 DIAGNOSIS — D509 Iron deficiency anemia, unspecified: Secondary | ICD-10-CM | POA: Diagnosis not present

## 2021-01-09 DIAGNOSIS — N186 End stage renal disease: Secondary | ICD-10-CM | POA: Diagnosis not present

## 2021-01-09 DIAGNOSIS — D688 Other specified coagulation defects: Secondary | ICD-10-CM | POA: Diagnosis not present

## 2021-01-09 DIAGNOSIS — Z992 Dependence on renal dialysis: Secondary | ICD-10-CM | POA: Diagnosis not present

## 2021-01-09 DIAGNOSIS — D649 Anemia, unspecified: Secondary | ICD-10-CM | POA: Diagnosis not present

## 2021-01-09 DIAGNOSIS — D689 Coagulation defect, unspecified: Secondary | ICD-10-CM | POA: Diagnosis not present

## 2021-01-09 NOTE — Telephone Encounter (Signed)
   Telephone encounter was:  Unsuccessful.  01/09/2021 Name: Cindy Burns MRN: [id number] DOB: 27-Jun-1946  Unsuccessful outbound call made today to assist with:  resources for ramp  Outreach Attempt:  2nd Attempt  A HIPAA compliant voice message was left requesting a return call.  Instructed patient to call back at [phone].  Niels Cranshaw, AAS Paralegal, [place] . Embedded Care Coordination [facility] Health  Care Management  [address], [place] [address] ??[email or web address]  ?? [phone]   [email or web address]

## 2021-01-10 ENCOUNTER — Telehealth: Payer: Self-pay

## 2021-01-10 NOTE — Telephone Encounter (Signed)
   Telephone encounter was:  Successful.  01/10/2021 Name: Cindy Burns MRN: [id number] DOB: Jul 08, 1946  Cindy Burns is a 75 y.o. year old female who is a primary care patient of [place], Amy E, MD . The community resource team was consulted for assistance with resource for wheelchair ramp  Care guide performed the following interventions: Patient provided with information about care guide support team and interviewed to confirm resource needs Discussed resources to assist with wheelchair ramp Spoke with patient about [employer] ([place]) referrals are taken over the phone. Patient will be connected with local church volunteers  that build wheelchair ramps.   .  Follow Up Plan:  Care guide will follow up with patient by phone over the next 7 days  Cindy Burns, Cindy Burns Paralegal, [place] . Embedded Care Coordination [facility] Health  Care Management  [address], [place] [address] ??[email or web address]  ?? [phone]   [email or web address]

## 2021-01-11 DIAGNOSIS — N2581 Secondary hyperparathyroidism of renal origin: Secondary | ICD-10-CM | POA: Diagnosis not present

## 2021-01-11 DIAGNOSIS — D688 Other specified coagulation defects: Secondary | ICD-10-CM | POA: Diagnosis not present

## 2021-01-11 DIAGNOSIS — N186 End stage renal disease: Secondary | ICD-10-CM | POA: Diagnosis not present

## 2021-01-11 DIAGNOSIS — D689 Coagulation defect, unspecified: Secondary | ICD-10-CM | POA: Diagnosis not present

## 2021-01-11 DIAGNOSIS — Z992 Dependence on renal dialysis: Secondary | ICD-10-CM | POA: Diagnosis not present

## 2021-01-11 DIAGNOSIS — D509 Iron deficiency anemia, unspecified: Secondary | ICD-10-CM | POA: Diagnosis not present

## 2021-01-11 DIAGNOSIS — T8249XA Other complication of vascular dialysis catheter, initial encounter: Secondary | ICD-10-CM | POA: Diagnosis not present

## 2021-01-11 DIAGNOSIS — D649 Anemia, unspecified: Secondary | ICD-10-CM | POA: Diagnosis not present

## 2021-01-12 ENCOUNTER — Telehealth: Payer: Self-pay

## 2021-01-12 ENCOUNTER — Other Ambulatory Visit: Payer: Self-pay

## 2021-01-12 ENCOUNTER — Other Ambulatory Visit: Payer: Self-pay | Admitting: *Deleted

## 2021-01-12 NOTE — Telephone Encounter (Signed)
   Telephone encounter was:  Unsuccessful.  01/12/2021 Name: Cindy Burns MRN: [id number] DOB: [birth year]  Unsuccessful outbound call made today to assist with:  resource for wheelchair ramp  Outreach Attempt:  3rd Attempt.  Referral closed unable to contact patient.  A HIPAA compliant voice message was left requesting a return call.  Instructed patient to call back at [phone].  [name], AAS Paralegal, [place] . Embedded Care Coordination [facility] Health  Care Management  [address], [place] [address] ??[name]  ?? [phone]   [email or web address]

## 2021-01-12 NOTE — Patient Outreach (Signed)
Cindy Burns) Care Management  01/12/2021  Cindy Burns 12/27/1945 [id number]  [facility] Telephone Assessment/Screen for MD, post hospital referral  Referral Date:10/26/20 Referral Source:THN hospital liaison, V Brewer Referral Reason:complex care and disease management follow up calls and assess for further needs Insurance:united [place](united healthcare dual complete (HMO SNP)   Patient is able to verify HIPAA, DOB and address Reviewed and addressed referral to [facility] patient Consent: [facility] RN CM reviewed [facility] services with patient. Patient gave verbal consent for services [facility] telephonic RN CM.   Follow up  Has a ramp that a friend made [employer]) but reports this ramp is unsafe Pt to receive paper work to complete th process with [employer]  Hemodialysis (HD), Going well at her [facility] dialysis center She is now trying to open up HD staff She spoke with a veteran HD patient of 28 years and he has sessions for 3 hours and 15 minutes or less She want have a short, concise HD session or to be able to return home from HD prior to dark Takes 3 1/2 hours for her to complete her sessions  She is aware of the HD SW  Transportation [employer] is working with providing her transportation which satisfies her request from the HD SW that she not drive herself to HD session She was encourage to speak with the HD director and her nephrologist about a change in her time of HD  Had her to consider changing the time of the day she gets her sessions  She is aware of peritoneal dialysis but does not prefer peritoneal A resolution to this per pt will promote or encourage better compliance   Preventive care services completed Got eye myeyedr exam and like her new glass Still working on the dentist services    Plans [facility] RN CM will follow up with patient within the next 14-21 business days Pt encouraged to return a  call to [facility] RN CM prn  Karsen Nakanishi L. [name], RN, BSN, [place] Coordinator Office number [phone] Main [facility] number [phone] Fax number [phone]

## 2021-01-13 DIAGNOSIS — D688 Other specified coagulation defects: Secondary | ICD-10-CM | POA: Diagnosis not present

## 2021-01-13 DIAGNOSIS — N2581 Secondary hyperparathyroidism of renal origin: Secondary | ICD-10-CM | POA: Diagnosis not present

## 2021-01-13 DIAGNOSIS — D509 Iron deficiency anemia, unspecified: Secondary | ICD-10-CM | POA: Diagnosis not present

## 2021-01-13 DIAGNOSIS — Z992 Dependence on renal dialysis: Secondary | ICD-10-CM | POA: Diagnosis not present

## 2021-01-13 DIAGNOSIS — N186 End stage renal disease: Secondary | ICD-10-CM | POA: Diagnosis not present

## 2021-01-13 DIAGNOSIS — T8249XA Other complication of vascular dialysis catheter, initial encounter: Secondary | ICD-10-CM | POA: Diagnosis not present

## 2021-01-13 DIAGNOSIS — D689 Coagulation defect, unspecified: Secondary | ICD-10-CM | POA: Diagnosis not present

## 2021-01-13 DIAGNOSIS — D649 Anemia, unspecified: Secondary | ICD-10-CM | POA: Diagnosis not present

## 2021-01-15 ENCOUNTER — Other Ambulatory Visit: Payer: Self-pay | Admitting: Family Medicine

## 2021-01-16 DIAGNOSIS — T8249XA Other complication of vascular dialysis catheter, initial encounter: Secondary | ICD-10-CM | POA: Diagnosis not present

## 2021-01-16 DIAGNOSIS — Z992 Dependence on renal dialysis: Secondary | ICD-10-CM | POA: Diagnosis not present

## 2021-01-16 DIAGNOSIS — N186 End stage renal disease: Secondary | ICD-10-CM | POA: Diagnosis not present

## 2021-01-16 DIAGNOSIS — N2581 Secondary hyperparathyroidism of renal origin: Secondary | ICD-10-CM | POA: Diagnosis not present

## 2021-01-16 DIAGNOSIS — D689 Coagulation defect, unspecified: Secondary | ICD-10-CM | POA: Diagnosis not present

## 2021-01-16 DIAGNOSIS — D509 Iron deficiency anemia, unspecified: Secondary | ICD-10-CM | POA: Diagnosis not present

## 2021-01-16 DIAGNOSIS — D688 Other specified coagulation defects: Secondary | ICD-10-CM | POA: Diagnosis not present

## 2021-01-16 DIAGNOSIS — R197 Diarrhea, unspecified: Secondary | ICD-10-CM | POA: Diagnosis not present

## 2021-01-16 NOTE — Telephone Encounter (Signed)
Last office visit 12/08/2020 hospital follow up. Last refilled Tramadol 12/08/2020 for #30 with no refills.  Gabapentin 08/25/2020 for #36 with 3 refills.  [place] scheduled for 03/23/2021.

## 2021-01-18 DIAGNOSIS — N186 End stage renal disease: Secondary | ICD-10-CM | POA: Diagnosis not present

## 2021-01-18 DIAGNOSIS — D688 Other specified coagulation defects: Secondary | ICD-10-CM | POA: Diagnosis not present

## 2021-01-18 DIAGNOSIS — N2581 Secondary hyperparathyroidism of renal origin: Secondary | ICD-10-CM | POA: Diagnosis not present

## 2021-01-18 DIAGNOSIS — R197 Diarrhea, unspecified: Secondary | ICD-10-CM | POA: Diagnosis not present

## 2021-01-18 DIAGNOSIS — Z992 Dependence on renal dialysis: Secondary | ICD-10-CM | POA: Diagnosis not present

## 2021-01-18 DIAGNOSIS — D689 Coagulation defect, unspecified: Secondary | ICD-10-CM | POA: Diagnosis not present

## 2021-01-18 DIAGNOSIS — D509 Iron deficiency anemia, unspecified: Secondary | ICD-10-CM | POA: Diagnosis not present

## 2021-01-18 DIAGNOSIS — T8249XA Other complication of vascular dialysis catheter, initial encounter: Secondary | ICD-10-CM | POA: Diagnosis not present

## 2021-01-20 DIAGNOSIS — T8249XA Other complication of vascular dialysis catheter, initial encounter: Secondary | ICD-10-CM | POA: Diagnosis not present

## 2021-01-20 DIAGNOSIS — N2581 Secondary hyperparathyroidism of renal origin: Secondary | ICD-10-CM | POA: Diagnosis not present

## 2021-01-20 DIAGNOSIS — Z992 Dependence on renal dialysis: Secondary | ICD-10-CM | POA: Diagnosis not present

## 2021-01-20 DIAGNOSIS — D689 Coagulation defect, unspecified: Secondary | ICD-10-CM | POA: Diagnosis not present

## 2021-01-20 DIAGNOSIS — D688 Other specified coagulation defects: Secondary | ICD-10-CM | POA: Diagnosis not present

## 2021-01-20 DIAGNOSIS — R197 Diarrhea, unspecified: Secondary | ICD-10-CM | POA: Diagnosis not present

## 2021-01-20 DIAGNOSIS — D509 Iron deficiency anemia, unspecified: Secondary | ICD-10-CM | POA: Diagnosis not present

## 2021-01-20 DIAGNOSIS — N186 End stage renal disease: Secondary | ICD-10-CM | POA: Diagnosis not present

## 2021-01-22 ENCOUNTER — Other Ambulatory Visit: Payer: Self-pay

## 2021-01-22 ENCOUNTER — Other Ambulatory Visit: Payer: Self-pay | Admitting: *Deleted

## 2021-01-22 ENCOUNTER — Telehealth: Payer: Self-pay

## 2021-01-22 DIAGNOSIS — M545 Low back pain, unspecified: Secondary | ICD-10-CM

## 2021-01-22 DIAGNOSIS — M1712 Unilateral primary osteoarthritis, left knee: Secondary | ICD-10-CM

## 2021-01-22 DIAGNOSIS — E1143 Type 2 diabetes mellitus with diabetic autonomic (poly)neuropathy: Secondary | ICD-10-CM

## 2021-01-22 NOTE — Patient Outreach (Signed)
[facility] [facility] Care Management  01/22/2021  Cindy Burns 1945/12/26 [id number]   [facility] Telephone Assessment/Screen for MD, posthospital referral  Referral Date:10/26/20 Referral Source:THN hospital liaison, V Brewer Referral Reason:complex care and disease management follow up calls and assess for further needs Insurance:united [place] of [place](united healthcare dual complete (HMO SNP)  Last admissions 1/94/17-01/12/13 acute metabolic encephalopathy, hyperkalemia, ESRD, DM  09/05/20-09/06/20 left against medical advice acute confusion, acute encephalopathy/confusion with differential diagnosis including metabolic encephalopathy, less likely acute CVA 48/1/85-63/14/97 Acute metabolic encephalopathy, DM, HTN, ESRD    Patient is able to verify HIPAA, DOB and address Reviewed and addressed referral to [facility] patient Consent: [facility] RN CM reviewed [facility] services with patient. Patient gave verbal consent for services [facility] telephonic RN CM.   Follow up assessment  Ramp application not completed yet Pt voices concern with the construction of the ramp possibly interference with her septic system and a charge noted on the application for providing an estimate for services  Pt was encouraged to please write on the application that she is on a fixed income related to charge She reports her present ramp continues not to be safe to walk down with her walker  She reports this ramp slopes straight down Encouraged to complete the ramp application provided to her by [facility] care guide and received by pt in the mail    Today pt is scheduled for her meals on wheels food delivery between 08-1199 This occurs each day she is not at Hemodialysis (HD),  Dental services  Pt has decided to walk in to [facility] [place] and still has the scheduled August 2022 appointment    Interested in outpatient therapy  Discussed the process and guidelines to  obtain outpatient therapy Hard getting started with cane ambulation Pt gives permission for [facility] RN CM to outreach to her pcp office to request assist with this  [facility] RN CM intervention Outreach to [employer] at [facility], primary care provider (PCP)  [phone] option 4 for nurse  Left a message for the triage nurse with request for outpatient therapy referral  Left pt and RN CM's contact information if needed for return calls  Outreach to [employer] at [employer] [place] [phone] (fax [phone]) Discussed with [name] pt's [facility] goals to include home ramp, possible change in HD time or day, dental services, etc [name] shared pt noted with some memory concerns during HD visit HD SW will outreach to patient to offer assistance  Left [facility] RN CM number   Plans [facility] RN CM will follow up with patient within the next 30 business days Pt encouraged to return a call to [facility] RN CM prn   Cindy Burns L. [name], RN, BSN, [place] Coordinator Office number [phone] Main [facility] number [phone] Fax number [phone]

## 2021-01-22 NOTE — Telephone Encounter (Signed)
Patient spoke with Cindy Burns and stated that she has been experiencing decreased mobility and was interested in having [facility] services ordered for this issue. Please advise.

## 2021-01-23 DIAGNOSIS — D649 Anemia, unspecified: Secondary | ICD-10-CM | POA: Diagnosis not present

## 2021-01-23 DIAGNOSIS — E1129 Type 2 diabetes mellitus with other diabetic kidney complication: Secondary | ICD-10-CM | POA: Diagnosis not present

## 2021-01-23 DIAGNOSIS — R197 Diarrhea, unspecified: Secondary | ICD-10-CM | POA: Diagnosis not present

## 2021-01-23 DIAGNOSIS — N2581 Secondary hyperparathyroidism of renal origin: Secondary | ICD-10-CM | POA: Diagnosis not present

## 2021-01-23 DIAGNOSIS — D689 Coagulation defect, unspecified: Secondary | ICD-10-CM | POA: Diagnosis not present

## 2021-01-23 DIAGNOSIS — Z992 Dependence on renal dialysis: Secondary | ICD-10-CM | POA: Diagnosis not present

## 2021-01-23 DIAGNOSIS — D688 Other specified coagulation defects: Secondary | ICD-10-CM | POA: Diagnosis not present

## 2021-01-23 DIAGNOSIS — T8249XA Other complication of vascular dialysis catheter, initial encounter: Secondary | ICD-10-CM | POA: Diagnosis not present

## 2021-01-23 DIAGNOSIS — N186 End stage renal disease: Secondary | ICD-10-CM | POA: Diagnosis not present

## 2021-01-23 NOTE — Addendum Note (Signed)
Addended byEliezer Lofts E on: 01/23/2021 02:01 PM   Modules accepted: Orders

## 2021-01-23 NOTE — Telephone Encounter (Signed)
Referral sent 

## 2021-01-24 ENCOUNTER — Ambulatory Visit: Payer: Medicare Other | Admitting: *Deleted

## 2021-01-25 DIAGNOSIS — R197 Diarrhea, unspecified: Secondary | ICD-10-CM | POA: Diagnosis not present

## 2021-01-25 DIAGNOSIS — D689 Coagulation defect, unspecified: Secondary | ICD-10-CM | POA: Diagnosis not present

## 2021-01-25 DIAGNOSIS — Z992 Dependence on renal dialysis: Secondary | ICD-10-CM | POA: Diagnosis not present

## 2021-01-25 DIAGNOSIS — N186 End stage renal disease: Secondary | ICD-10-CM | POA: Diagnosis not present

## 2021-01-25 DIAGNOSIS — E1129 Type 2 diabetes mellitus with other diabetic kidney complication: Secondary | ICD-10-CM | POA: Diagnosis not present

## 2021-01-25 DIAGNOSIS — T8249XA Other complication of vascular dialysis catheter, initial encounter: Secondary | ICD-10-CM | POA: Diagnosis not present

## 2021-01-25 DIAGNOSIS — D649 Anemia, unspecified: Secondary | ICD-10-CM | POA: Diagnosis not present

## 2021-01-25 DIAGNOSIS — D688 Other specified coagulation defects: Secondary | ICD-10-CM | POA: Diagnosis not present

## 2021-01-25 DIAGNOSIS — N2581 Secondary hyperparathyroidism of renal origin: Secondary | ICD-10-CM | POA: Diagnosis not present

## 2021-01-30 DIAGNOSIS — N186 End stage renal disease: Secondary | ICD-10-CM | POA: Diagnosis not present

## 2021-01-30 DIAGNOSIS — D689 Coagulation defect, unspecified: Secondary | ICD-10-CM | POA: Diagnosis not present

## 2021-01-30 DIAGNOSIS — Z992 Dependence on renal dialysis: Secondary | ICD-10-CM | POA: Diagnosis not present

## 2021-01-30 DIAGNOSIS — D509 Iron deficiency anemia, unspecified: Secondary | ICD-10-CM | POA: Diagnosis not present

## 2021-01-30 DIAGNOSIS — R197 Diarrhea, unspecified: Secondary | ICD-10-CM | POA: Diagnosis not present

## 2021-01-30 DIAGNOSIS — T8249XA Other complication of vascular dialysis catheter, initial encounter: Secondary | ICD-10-CM | POA: Diagnosis not present

## 2021-01-30 DIAGNOSIS — N2581 Secondary hyperparathyroidism of renal origin: Secondary | ICD-10-CM | POA: Diagnosis not present

## 2021-01-30 DIAGNOSIS — D688 Other specified coagulation defects: Secondary | ICD-10-CM | POA: Diagnosis not present

## 2021-01-31 IMAGING — DX DG CHEST 1V PORT
1 series · 1 of 1 positions shown · non-contrast
Comparison: Chest x-ray 09/05/2020, CT chest 02/11/2014

CLINICAL DATA: Sepsis, evaluate for abnormality. Refusing dialysis.

EXAM:
PORTABLE CHEST 1 VIEW

[chest ap]
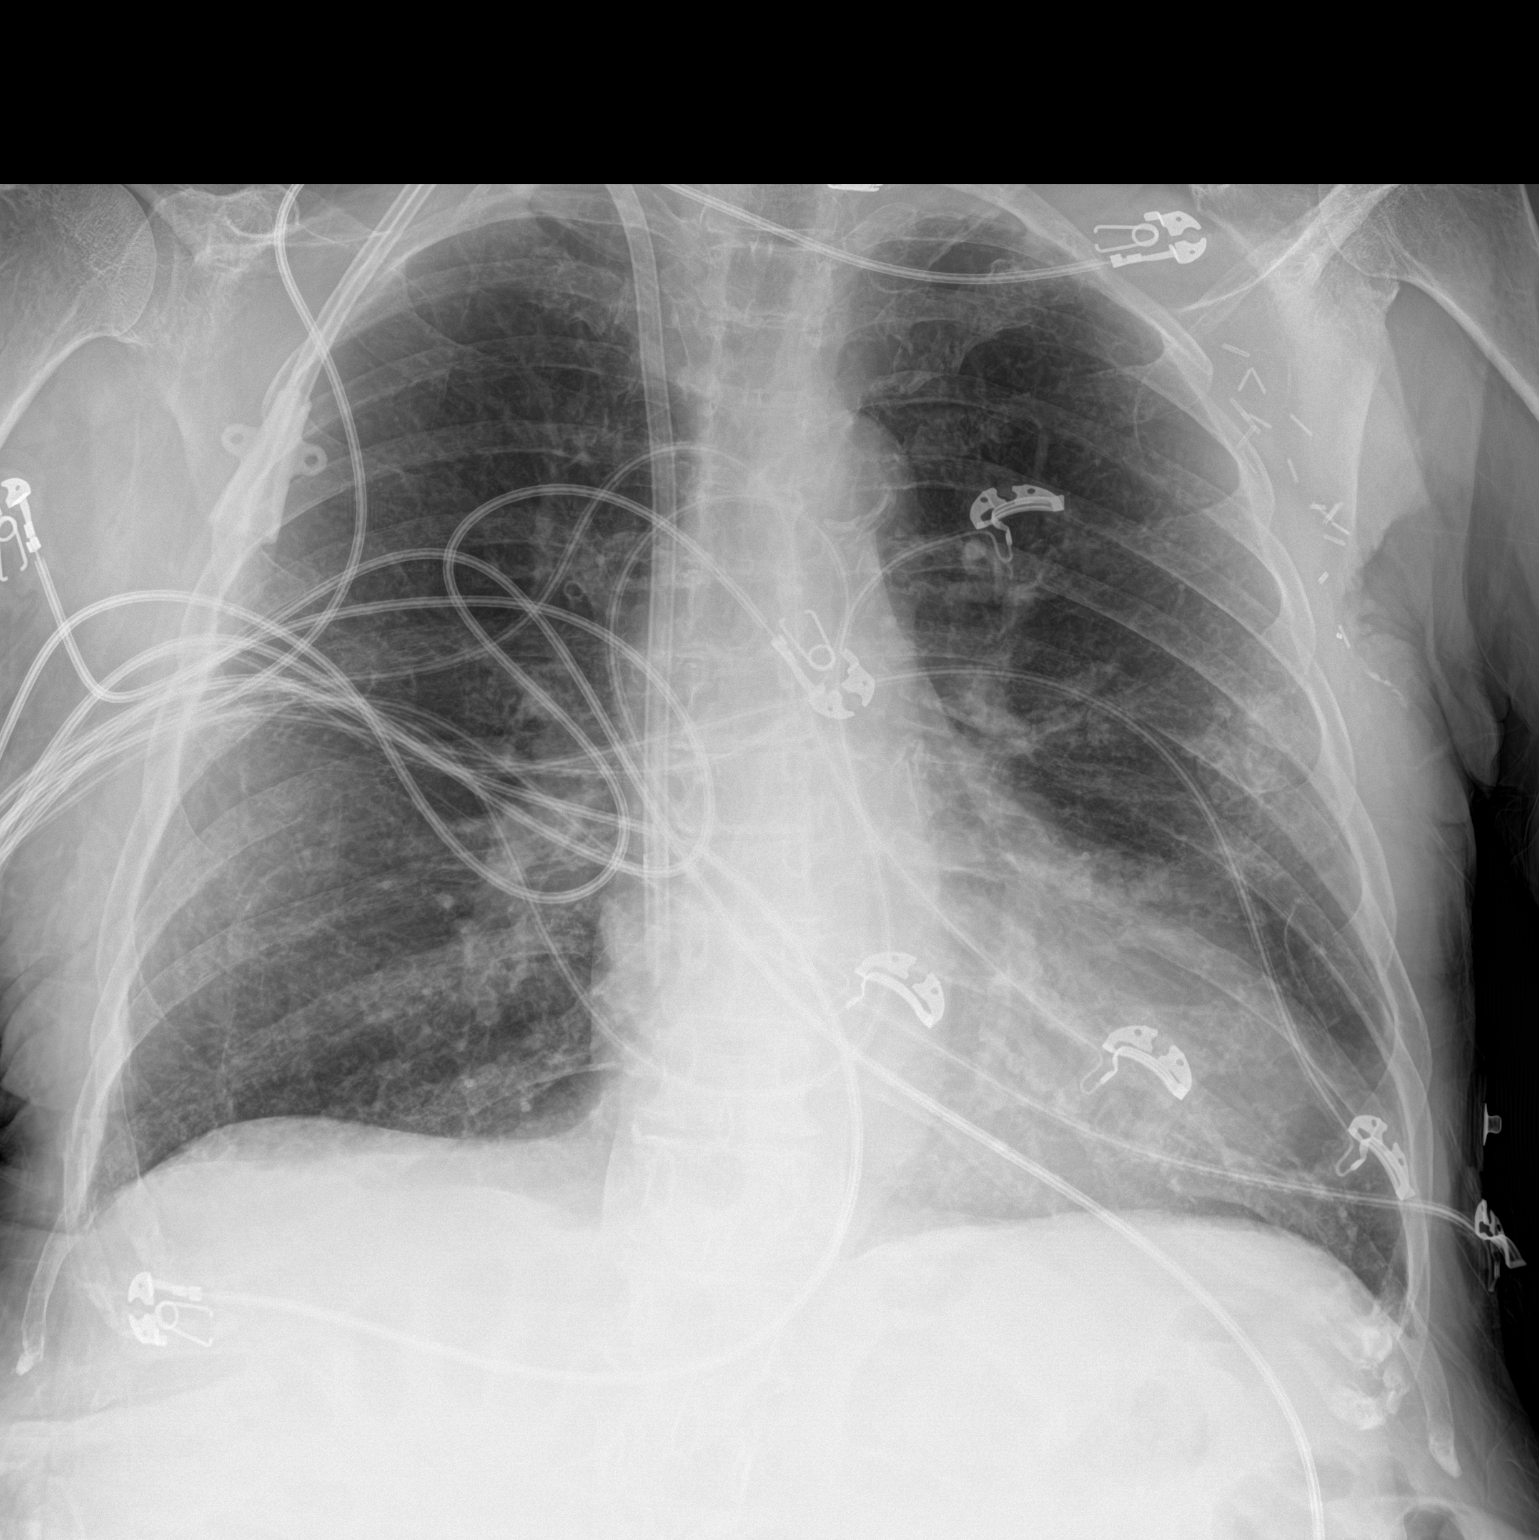

[1 of 1 positions shown; findings below may reference images not displayed]

FINDINGS: Right chest wall dialysis catheter with tip overlying the right
atrium.

The heart size and mediastinal contours are unchanged. Aortic arch
calcifications.

Vague airspace opacity within the peripheral mid lung zone. Chronic
increased interstitial markings with no overt pulmonary edema. No
pleural effusion. No pneumothorax.

No acute osseous abnormality. Known thoracic compression fractures
not well visualized.

Surgical clips overlie the left axilla.
IMPRESSION: Vague airspace opacity within the peripheral mid lung zone.
Recommend repeat PA and lateral view of the chest.

## 2021-02-01 DIAGNOSIS — R197 Diarrhea, unspecified: Secondary | ICD-10-CM | POA: Diagnosis not present

## 2021-02-01 DIAGNOSIS — T8249XA Other complication of vascular dialysis catheter, initial encounter: Secondary | ICD-10-CM | POA: Diagnosis not present

## 2021-02-01 DIAGNOSIS — Z992 Dependence on renal dialysis: Secondary | ICD-10-CM | POA: Diagnosis not present

## 2021-02-01 DIAGNOSIS — D509 Iron deficiency anemia, unspecified: Secondary | ICD-10-CM | POA: Diagnosis not present

## 2021-02-01 DIAGNOSIS — N2581 Secondary hyperparathyroidism of renal origin: Secondary | ICD-10-CM | POA: Diagnosis not present

## 2021-02-01 DIAGNOSIS — D689 Coagulation defect, unspecified: Secondary | ICD-10-CM | POA: Diagnosis not present

## 2021-02-01 DIAGNOSIS — D688 Other specified coagulation defects: Secondary | ICD-10-CM | POA: Diagnosis not present

## 2021-02-01 DIAGNOSIS — N186 End stage renal disease: Secondary | ICD-10-CM | POA: Diagnosis not present

## 2021-02-02 ENCOUNTER — Ambulatory Visit: Payer: Medicare Other | Admitting: Family Medicine

## 2021-02-03 DIAGNOSIS — N186 End stage renal disease: Secondary | ICD-10-CM | POA: Diagnosis not present

## 2021-02-03 DIAGNOSIS — D688 Other specified coagulation defects: Secondary | ICD-10-CM | POA: Diagnosis not present

## 2021-02-03 DIAGNOSIS — N179 Acute kidney failure, unspecified: Secondary | ICD-10-CM | POA: Diagnosis not present

## 2021-02-03 DIAGNOSIS — T8249XA Other complication of vascular dialysis catheter, initial encounter: Secondary | ICD-10-CM | POA: Diagnosis not present

## 2021-02-03 DIAGNOSIS — N2581 Secondary hyperparathyroidism of renal origin: Secondary | ICD-10-CM | POA: Diagnosis not present

## 2021-02-03 DIAGNOSIS — D509 Iron deficiency anemia, unspecified: Secondary | ICD-10-CM | POA: Diagnosis not present

## 2021-02-03 DIAGNOSIS — D689 Coagulation defect, unspecified: Secondary | ICD-10-CM | POA: Diagnosis not present

## 2021-02-03 DIAGNOSIS — Z992 Dependence on renal dialysis: Secondary | ICD-10-CM | POA: Diagnosis not present

## 2021-02-03 DIAGNOSIS — R197 Diarrhea, unspecified: Secondary | ICD-10-CM | POA: Diagnosis not present

## 2021-02-05 ENCOUNTER — Telehealth: Payer: Self-pay | Admitting: Family Medicine

## 2021-02-05 DIAGNOSIS — N186 End stage renal disease: Secondary | ICD-10-CM | POA: Diagnosis not present

## 2021-02-05 DIAGNOSIS — R2681 Unsteadiness on feet: Secondary | ICD-10-CM | POA: Diagnosis not present

## 2021-02-05 DIAGNOSIS — M545 Low back pain, unspecified: Secondary | ICD-10-CM | POA: Diagnosis not present

## 2021-02-05 DIAGNOSIS — E1143 Type 2 diabetes mellitus with diabetic autonomic (poly)neuropathy: Secondary | ICD-10-CM | POA: Diagnosis not present

## 2021-02-05 DIAGNOSIS — Z992 Dependence on renal dialysis: Secondary | ICD-10-CM | POA: Diagnosis not present

## 2021-02-05 DIAGNOSIS — M1712 Unilateral primary osteoarthritis, left knee: Secondary | ICD-10-CM | POA: Diagnosis not present

## 2021-02-05 DIAGNOSIS — M6281 Muscle weakness (generalized): Secondary | ICD-10-CM | POA: Diagnosis not present

## 2021-02-05 DIAGNOSIS — Z7984 Long term (current) use of oral hypoglycemic drugs: Secondary | ICD-10-CM | POA: Diagnosis not present

## 2021-02-05 MED ORDER — METOPROLOL TARTRATE 25 MG PO TABS: 25.0000 mg | ORAL_TABLET | Freq: Two times a day (BID) | ORAL | 5 refills | Status: AC

## 2021-02-05 NOTE — Telephone Encounter (Signed)
Spoke with Marzetta Board from [place].  I advised that patient should be taking Metoprolol 25 mg 1 tablet twice a day, Crestor 20 mg daily and baby asa 81 mg daily per medication list.  Marzetta Board states patient does not have Crestor or metoprolol at her home.  She does have the baby asa.  [facility]  [facility] [place]    Marzetta Board would like to know if patient is suppose to be taking the folic acid 1 mg and the sodium bicarbonate .  Looks like patient was given a 30 day supply of sodium bicarbonate on 10/25/20 at hospital discharge with no refills.  Folic Acid 1 mg patient was given a 30 day supply at hospital discharge on 08/17/2020 with no refills.   Please advise.   Verbal orders for PT 1 x week for 8 weeks was also given to [place].

## 2021-02-05 NOTE — Telephone Encounter (Signed)
I think both of those meds  (sodium bicarb  and folic acid) and need to be run by her nephrologist at dialysis.

## 2021-02-05 NOTE — Telephone Encounter (Signed)
stacey ci she is trying to put her medicine in the chart, and its some medicine she stated that she is not taking.  medcation not being taken: Metoprolol, rosuvasatin, aspirin   And wanted to know about PT 1W8

## 2021-02-05 NOTE — Telephone Encounter (Signed)
Stacy notified as instructed by telephone.

## 2021-02-06 DIAGNOSIS — R197 Diarrhea, unspecified: Secondary | ICD-10-CM | POA: Diagnosis not present

## 2021-02-06 DIAGNOSIS — D509 Iron deficiency anemia, unspecified: Secondary | ICD-10-CM | POA: Diagnosis not present

## 2021-02-06 DIAGNOSIS — N2581 Secondary hyperparathyroidism of renal origin: Secondary | ICD-10-CM | POA: Diagnosis not present

## 2021-02-06 DIAGNOSIS — T8249XA Other complication of vascular dialysis catheter, initial encounter: Secondary | ICD-10-CM | POA: Diagnosis not present

## 2021-02-06 DIAGNOSIS — N186 End stage renal disease: Secondary | ICD-10-CM | POA: Diagnosis not present

## 2021-02-06 DIAGNOSIS — D689 Coagulation defect, unspecified: Secondary | ICD-10-CM | POA: Diagnosis not present

## 2021-02-06 DIAGNOSIS — D688 Other specified coagulation defects: Secondary | ICD-10-CM | POA: Diagnosis not present

## 2021-02-06 DIAGNOSIS — Z992 Dependence on renal dialysis: Secondary | ICD-10-CM | POA: Diagnosis not present

## 2021-02-07 ENCOUNTER — Ambulatory Visit (INDEPENDENT_AMBULATORY_CARE_PROVIDER_SITE_OTHER): Payer: Medicare Other | Admitting: Family Medicine

## 2021-02-07 ENCOUNTER — Other Ambulatory Visit: Payer: Self-pay | Admitting: *Deleted

## 2021-02-07 ENCOUNTER — Encounter: Payer: Self-pay | Admitting: Family Medicine

## 2021-02-07 ENCOUNTER — Other Ambulatory Visit: Payer: Self-pay

## 2021-02-07 VITALS — BP 122/58 | HR 92 | Temp 98.1°F | Ht 62.0 in | Wt 111.0 lb

## 2021-02-07 DIAGNOSIS — E1121 Type 2 diabetes mellitus with diabetic nephropathy: Secondary | ICD-10-CM

## 2021-02-07 DIAGNOSIS — E1143 Type 2 diabetes mellitus with diabetic autonomic (poly)neuropathy: Secondary | ICD-10-CM

## 2021-02-07 DIAGNOSIS — R5381 Other malaise: Secondary | ICD-10-CM | POA: Diagnosis not present

## 2021-02-07 NOTE — Patient Outreach (Signed)
[name] Care Management  02/07/2021  Cindy Burns [birth year] [id number]   [facility] Telephone Assessment/Screen for MD, posthospital referral  Referral Date:10/26/20 Referral Source:THN hospital liaison, V Brewer Referral Reason:complex care and disease management follow up calls and assess for further needs Insurance:united [place](united healthcare dual complete (HMO SNP)  Last admissions 0/17/79-3/90/30 acute metabolic encephalopathy, hyperkalemia, ESRD, DM  09/05/20-09/06/20 left against medical advice acute confusion, acute encephalopathy/confusion with differential diagnosis including metabolic encephalopathy, less likely acute CVA 06/08/32-00/76/22 Acute metabolic encephalopathy, DM, HTN, ESRD    Patient is able to verify HIPAA, DOB and address Reviewed and addressed referral to [facility] patient Consent: [facility] RN CM reviewed [facility] services with patient. Patient gave verbal consent for services [facility] telephonic RN CM.  Follow up assessment  Pt reports she is doing very well today  She confirms her Hemodialysis (HD) SW has outreached to her to assist with a list of companies to assist with a home ramp and to assist with getting home health PT started She recently had a home evaluation and is aware she has 7 more home visits [facility] RN CM discussed home health therapy and outpatient therapy visit medicare guidelines She voiced understanding She voices she hope that with her home health PT sessions she will be able to ambulate well enough that she will not need a home ramp  HD sessions- She confirms HD sessions are shorter at "about thirty minutes" Weight 111 lbs BMI 20.3 was 120 lbs on 06/16/20 BMI of 22.31 Diabetes continues to be managed last HgA1c + 4.7 on 12/08/20 Hypertension managed At today's pcp appointment she ws 122/58  Smoking- She continues to smoke but reports she is not ready for smoke cessation Educated that  smoking increases her medical risks  Dental preventive care - Still pending a walk in visit to [employer] with an already scheduled appointment in August 2022  Patient Active Problem List   Diagnosis Date Noted  . Physical deconditioning 02/07/2021  . Primary osteoarthritis of left knee 12/08/2020  . Acute midline low back pain without sciatica 12/08/2020  . Type 2 diabetes with nephropathy ([place]) 10/27/2020  . Hyperkalemia 10/22/2020  . Acute confusion 09/05/2020  . Bilateral leg pain 08/25/2020  . Folate deficiency 08/17/2020  . Acute metabolic encephalopathy 63/33/5456  . Closed traumatic minimally displaced fracture of two ribs 08/15/2020  . Fever 04/30/2020  . ESRD (end stage renal disease) ([place]) 04/30/2020  . Hypocalcemia 01/19/2020  . Metabolic acidosis 25/63/8937  . Acute respiratory failure with hypoxia ([place]) 01/19/2020  . UTI (urinary tract infection) 01/19/2020  . Encounter for central line placement   . Pain and swelling of ankle, left   . Pressure injury of skin 01/09/2020  . Renal failure 01/08/2020  . Acute renal failure on dialysis ([place])   . Hepatomegaly 07/30/2019  . Right carotid bruit 07/30/2019  . SIADH (syndrome of inappropriate ADH production) ([place]) 04/06/2019  . B12 deficiency 04/06/2019  . Balance problem 03/18/2019  . Ventral hernia 04/03/2017  . Hypertension associated with diabetes ([place]) 02/28/2017  . Chronic insomnia 10/26/2013  . Diabetic autonomic neuropathy ([place]) 08/12/2013  . PULMONARY NODULE 09/27/2009  . ADENOCARCINOMA, BREAST 02/28/2009  . Normocytic anemia 06/24/2008  . Moderate COPD (chronic obstructive pulmonary disease) ([place]) 03/14/2008  . Major depressive disorder, recurrent episode, moderate ([place]) 08/11/2007  . Hyperlipidemia associated with type 2 diabetes mellitus ([place]) 04/30/2007  . CIGARETTE SMOKER 04/22/2007     Plans Patient agrees to care plan and  follow up within the next 30-45 business days Pt encouraged to return a  call to [place] CM prn Goals Addressed              This Visit's Progress     Patient Stated   .  COMPLETED: [facility]) Track and Manage My Blood Pressure-Hypertension (pt-stated)   On track     Timeframe:  Short-Term Goal Priority:  Medium Start Date:              11/17/20               Expected End Date:               02/07/21        Goal met 02/07/21   - choose a place to take my blood pressure (home, clinic or office, retail store) - write blood pressure results in a log or diary    Notes:  02/07/21 BP managed well Goal met  12/01/20 worked with RN CM to get new [employer] OTC catalog to get BP cuff - conference calls to [facility]  11/17/20 pending order of BP from [place] program catalog she has ordered     .  (THN)Follow My Treatment Plan-Chronic Kidney (pt-stated)   On track     Timeframe:  Long-Range Goal Priority:  Medium Start Date:                02/07/21             Expected End Date:         04/05/21              Follow Up Date 02/28/21   - call the doctor or nurse to get help with side effects - keep follow-up appointments    Notes:  02/07/21 doing well at Hemodialysis, reports more convenient, shorter sessions, Denies side effects Keeping appointments Seen by pcp today 02/07/21        Joelene Millin L. [name], RN, BSN, [place] Coordinator Office number [phone] Main [facility] number [phone] Fax number [phone]

## 2021-02-07 NOTE — Progress Notes (Signed)
Patient ID: Cindy Burns, female    DOB: Sep 16, 1946, 75 y.o.   MRN: [id number]  This visit was conducted in person.  BP (!) 122/58 (BP Location: Left Arm, Patient Position: Sitting, Cuff Size: Normal)   Pulse 92   Temp 98.1 F (36.7 C) (Temporal)   Ht 5\' 2"  (1.575 m)   Wt 111 lb (50.3 kg)   SpO2 94%   BMI 20.30 kg/m    CC:  Chief Complaint  Patient presents with  . Acute Visit    Home health referral needed for PT to be accepted    Subjective:   HPI: Cindy Burns is a 75 y.o. female presenting on 02/07/2021 for Acute Visit (Home health referral needed for PT to be accepted)  She has extensive medical history including diabetes,well controlled, ESRD on dialysis, diabetic autonomic neuropathy,  Moderate COPD and hypertension.  Home Health  She reports  she has had PT coming out since 02/05/2021.  She has been feeling weak and deconditioned since last hospital visit 10/2020.  She is using  4 cane to ambulate. She feels leg weakness with 30 feet. No SOB.  Sometimes has to you a wheelchair to get around given weakness. th walking more than .  No current pain in knees, has history of knee OA.  Pain in back with trying to push up out of chair..  She has daily back pain  Has peripheral neuropathy.  Gabapentin 400 mg BID daily... still has tingling and toe pain.[name] worst at night. This is already above ding for dialysis.[name] but pt tolerating well.       Relevant past medical, surgical, family and social history reviewed and updated as indicated. Interim medical history since our last visit reviewed. Allergies and medications reviewed and updated. Outpatient Medications Prior to Visit  Medication Sig Dispense Refill  . albuterol (VENTOLIN HFA) 108 (90 Base) MCG/ACT inhaler TAKE 2 PUFFS BY MOUTH EVERY 6 HOURS AS NEEDED FOR WHEEZE OR SHORTNESS OF BREATH 8.5 each 2  . amLODipine (NORVASC) 10 MG tablet Take 1 tablet (10 mg total) by mouth daily. 30 tablet 0  . BAYER LOW DOSE 81 MG  EC tablet Take 81 mg by mouth in the morning. Swallow whole.    . diclofenac Sodium (VOLTAREN) 1 % GEL Apply 2-4 g topically 4 (four) times daily as needed (to painful sites).     . folic acid (FOLVITE) 1 MG tablet Take 1 tablet (1 mg total) by mouth daily. 30 tablet 3  . gabapentin (NEURONTIN) 100 MG capsule Take 100 mg by mouth 2 (two) times daily.    [name] gabapentin (NEURONTIN) 300 MG capsule TAKE 1 CAPSULE BY MOUTH AFTER DIALYSIS ON DIALYSIS ON DAYS 36 capsule 3  . ipratropium-albuterol (DUONEB) 0.5-2.5 (3) MG/3ML SOLN Take 3 mLs by nebulization every 6 (six) hours as needed. 360 mL 0  . iron sucrose in sodium chloride 0.9 % 100 mL Inject into the vein.    [name] JANUVIA 25 MG tablet TAKE 1 TABLET BY MOUTH EVERY DAY 30 tablet 3  . LOPERAMIDE HCL PO Take by mouth.    . Methoxy PEG-Epoetin Beta (MIRCERA IJ) Mircera    . metoprolol tartrate (LOPRESSOR) 25 MG tablet Take 1 tablet (25 mg total) by mouth 2 (two) times daily. 60 tablet 5  . rosuvastatin (CRESTOR) 20 MG tablet TAKE 1 TABLET BY MOUTH EVERY DAY 90 tablet 3  . sevelamer carbonate (RENVELA) 800 MG tablet Take 1,600 mg by mouth See admin  instructions. Take 1,600 mg by mouth with meals (when eating)    . sodium bicarbonate 650 MG tablet Take 2 tablets (1,300 mg total) by mouth 3 (three) times daily. 90 tablet 0  . traMADol (ULTRAM) 50 MG tablet TAKE 1/2 TABLET BY MOUTH TWICE A DAY AS NEEDED 23 tablet 1  . VELPHORO 500 MG chewable tablet Chew 500 mg by mouth 3 (three) times daily.    [name] venlafaxine XR (EFFEXOR-XR) 37.5 MG 24 hr capsule TAKE 1 CAPSULE (37.5 MG TOTAL) BY MOUTH AT BEDTIME. 90 capsule 0   No facility-administered medications prior to visit.     Per HPI unless specifically indicated in ROS section below Review of Systems  Constitutional: Positive for fatigue. Negative for fever.  HENT: Negative for congestion.   Eyes: Negative for pain.  Respiratory: Negative for cough and shortness of breath.   Cardiovascular: Negative for chest  pain, palpitations and leg swelling.  Gastrointestinal: Negative for abdominal pain.  Genitourinary: Negative for dysuria and vaginal bleeding.  Musculoskeletal: Positive for gait problem. Negative for back pain.  Neurological: Positive for weakness. Negative for syncope, light-headedness and headaches.  Psychiatric/Behavioral: Negative for dysphoric mood.   Objective:  BP (!) 122/58 (BP Location: Left Arm, Patient Position: Sitting, Cuff Size: Normal)   Pulse 92   Temp 98.1 F (36.7 C) (Temporal)   Ht 5\' 2"  (1.575 m)   Wt 111 lb (50.3 kg)   SpO2 94%   BMI 20.30 kg/m   Wt Readings from Last 3 Encounters:  02/07/21 111 lb (50.3 kg)  12/08/20 112 lb 12 oz (51.1 kg)  10/25/20 107 lb 2.3 oz (48.6 kg)      Physical Exam    Results for orders placed or performed in visit on 12/08/20  POCT glycosylated hemoglobin (Hb A1C)  Result Value Ref Range   Hemoglobin A1C 4.7 4.0 - 5.6 %   HbA1c POC (<> result, manual entry)     HbA1c, POC (prediabetic range)     HbA1c, POC (controlled diabetic range)      This visit occurred during the SARS-CoV-2 public health emergency.  Safety protocols were in place, including screening questions prior to the visit, additional usage of staff PPE, and extensive cleaning of exam room while observing appropriate contact time as indicated for disinfecting solutions.   COVID 19 screen:  No recent travel or known exposure to COVID19 The patient denies respiratory symptoms of COVID 19 at this time. The importance of social distancing was discussed today.   Assessment and Plan Problem List Items Addressed This Visit    Diabetic autonomic neuropathy ([place])    She continues to have tingling and some toe pain at night despite gabapentin.  She is on a dose too high for dialysis but seems to be tolerating well. Since additional 100 mg tablet does not seem to be helping I have asked her to stop it.        Physical deconditioning - Primary    PT already ordered  and pt doing well with it so far.       Type 2 diabetes with nephropathy (HCC)    Stable control last check.            Eliezer Lofts, MD

## 2021-02-07 NOTE — Assessment & Plan Note (Signed)
PT already ordered and pt doing well with it so far.

## 2021-02-07 NOTE — Assessment & Plan Note (Signed)
She continues to have tingling and some toe pain at night despite gabapentin.  She is on a dose too high for dialysis but seems to be tolerating well. Since additional 100 mg tablet does not seem to be helping I have asked her to stop it.

## 2021-02-07 NOTE — Patient Instructions (Signed)
We will  make sure referral to PT is arranged.  Decrease gabapentin as discussed.

## 2021-02-07 NOTE — Assessment & Plan Note (Signed)
Stable control last check.

## 2021-02-08 ENCOUNTER — Encounter: Payer: Self-pay | Admitting: Family Medicine

## 2021-02-08 DIAGNOSIS — Z992 Dependence on renal dialysis: Secondary | ICD-10-CM | POA: Diagnosis not present

## 2021-02-08 DIAGNOSIS — D509 Iron deficiency anemia, unspecified: Secondary | ICD-10-CM | POA: Diagnosis not present

## 2021-02-08 DIAGNOSIS — T8249XA Other complication of vascular dialysis catheter, initial encounter: Secondary | ICD-10-CM | POA: Diagnosis not present

## 2021-02-08 DIAGNOSIS — N2581 Secondary hyperparathyroidism of renal origin: Secondary | ICD-10-CM | POA: Diagnosis not present

## 2021-02-08 DIAGNOSIS — N186 End stage renal disease: Secondary | ICD-10-CM | POA: Diagnosis not present

## 2021-02-08 DIAGNOSIS — D688 Other specified coagulation defects: Secondary | ICD-10-CM | POA: Diagnosis not present

## 2021-02-08 DIAGNOSIS — D689 Coagulation defect, unspecified: Secondary | ICD-10-CM | POA: Diagnosis not present

## 2021-02-08 DIAGNOSIS — R197 Diarrhea, unspecified: Secondary | ICD-10-CM | POA: Diagnosis not present

## 2021-02-10 DIAGNOSIS — R197 Diarrhea, unspecified: Secondary | ICD-10-CM | POA: Diagnosis not present

## 2021-02-10 DIAGNOSIS — N186 End stage renal disease: Secondary | ICD-10-CM | POA: Diagnosis not present

## 2021-02-10 DIAGNOSIS — D688 Other specified coagulation defects: Secondary | ICD-10-CM | POA: Diagnosis not present

## 2021-02-10 DIAGNOSIS — D509 Iron deficiency anemia, unspecified: Secondary | ICD-10-CM | POA: Diagnosis not present

## 2021-02-10 DIAGNOSIS — D689 Coagulation defect, unspecified: Secondary | ICD-10-CM | POA: Diagnosis not present

## 2021-02-10 DIAGNOSIS — N2581 Secondary hyperparathyroidism of renal origin: Secondary | ICD-10-CM | POA: Diagnosis not present

## 2021-02-10 DIAGNOSIS — T8249XA Other complication of vascular dialysis catheter, initial encounter: Secondary | ICD-10-CM | POA: Diagnosis not present

## 2021-02-10 DIAGNOSIS — Z992 Dependence on renal dialysis: Secondary | ICD-10-CM | POA: Diagnosis not present

## 2021-02-12 DIAGNOSIS — M545 Low back pain, unspecified: Secondary | ICD-10-CM | POA: Diagnosis not present

## 2021-02-12 DIAGNOSIS — M1712 Unilateral primary osteoarthritis, left knee: Secondary | ICD-10-CM | POA: Diagnosis not present

## 2021-02-12 DIAGNOSIS — R2681 Unsteadiness on feet: Secondary | ICD-10-CM | POA: Diagnosis not present

## 2021-02-12 DIAGNOSIS — N186 End stage renal disease: Secondary | ICD-10-CM | POA: Diagnosis not present

## 2021-02-12 DIAGNOSIS — Z992 Dependence on renal dialysis: Secondary | ICD-10-CM | POA: Diagnosis not present

## 2021-02-12 DIAGNOSIS — M6281 Muscle weakness (generalized): Secondary | ICD-10-CM | POA: Diagnosis not present

## 2021-02-12 DIAGNOSIS — Z7984 Long term (current) use of oral hypoglycemic drugs: Secondary | ICD-10-CM | POA: Diagnosis not present

## 2021-02-12 DIAGNOSIS — E1143 Type 2 diabetes mellitus with diabetic autonomic (poly)neuropathy: Secondary | ICD-10-CM | POA: Diagnosis not present

## 2021-02-13 DIAGNOSIS — Z992 Dependence on renal dialysis: Secondary | ICD-10-CM | POA: Diagnosis not present

## 2021-02-13 DIAGNOSIS — D509 Iron deficiency anemia, unspecified: Secondary | ICD-10-CM | POA: Diagnosis not present

## 2021-02-13 DIAGNOSIS — D689 Coagulation defect, unspecified: Secondary | ICD-10-CM | POA: Diagnosis not present

## 2021-02-13 DIAGNOSIS — R197 Diarrhea, unspecified: Secondary | ICD-10-CM | POA: Diagnosis not present

## 2021-02-13 DIAGNOSIS — D688 Other specified coagulation defects: Secondary | ICD-10-CM | POA: Diagnosis not present

## 2021-02-13 DIAGNOSIS — N2581 Secondary hyperparathyroidism of renal origin: Secondary | ICD-10-CM | POA: Diagnosis not present

## 2021-02-13 DIAGNOSIS — T8249XA Other complication of vascular dialysis catheter, initial encounter: Secondary | ICD-10-CM | POA: Diagnosis not present

## 2021-02-13 DIAGNOSIS — N186 End stage renal disease: Secondary | ICD-10-CM | POA: Diagnosis not present

## 2021-02-17 DIAGNOSIS — Z992 Dependence on renal dialysis: Secondary | ICD-10-CM | POA: Diagnosis not present

## 2021-02-17 DIAGNOSIS — T8249XA Other complication of vascular dialysis catheter, initial encounter: Secondary | ICD-10-CM | POA: Diagnosis not present

## 2021-02-17 DIAGNOSIS — D689 Coagulation defect, unspecified: Secondary | ICD-10-CM | POA: Diagnosis not present

## 2021-02-17 DIAGNOSIS — R197 Diarrhea, unspecified: Secondary | ICD-10-CM | POA: Diagnosis not present

## 2021-02-17 DIAGNOSIS — N186 End stage renal disease: Secondary | ICD-10-CM | POA: Diagnosis not present

## 2021-02-17 DIAGNOSIS — D509 Iron deficiency anemia, unspecified: Secondary | ICD-10-CM | POA: Diagnosis not present

## 2021-02-17 DIAGNOSIS — D688 Other specified coagulation defects: Secondary | ICD-10-CM | POA: Diagnosis not present

## 2021-02-17 DIAGNOSIS — N2581 Secondary hyperparathyroidism of renal origin: Secondary | ICD-10-CM | POA: Diagnosis not present

## 2021-02-19 DIAGNOSIS — N186 End stage renal disease: Secondary | ICD-10-CM | POA: Diagnosis not present

## 2021-02-19 DIAGNOSIS — R2681 Unsteadiness on feet: Secondary | ICD-10-CM | POA: Diagnosis not present

## 2021-02-19 DIAGNOSIS — M6281 Muscle weakness (generalized): Secondary | ICD-10-CM | POA: Diagnosis not present

## 2021-02-19 DIAGNOSIS — Z992 Dependence on renal dialysis: Secondary | ICD-10-CM | POA: Diagnosis not present

## 2021-02-19 DIAGNOSIS — Z7984 Long term (current) use of oral hypoglycemic drugs: Secondary | ICD-10-CM | POA: Diagnosis not present

## 2021-02-19 DIAGNOSIS — M545 Low back pain, unspecified: Secondary | ICD-10-CM | POA: Diagnosis not present

## 2021-02-19 DIAGNOSIS — M1712 Unilateral primary osteoarthritis, left knee: Secondary | ICD-10-CM | POA: Diagnosis not present

## 2021-02-19 DIAGNOSIS — E1143 Type 2 diabetes mellitus with diabetic autonomic (poly)neuropathy: Secondary | ICD-10-CM | POA: Diagnosis not present

## 2021-02-22 DIAGNOSIS — Z992 Dependence on renal dialysis: Secondary | ICD-10-CM | POA: Diagnosis not present

## 2021-02-22 DIAGNOSIS — D509 Iron deficiency anemia, unspecified: Secondary | ICD-10-CM | POA: Diagnosis not present

## 2021-02-22 DIAGNOSIS — D688 Other specified coagulation defects: Secondary | ICD-10-CM | POA: Diagnosis not present

## 2021-02-22 DIAGNOSIS — T8249XA Other complication of vascular dialysis catheter, initial encounter: Secondary | ICD-10-CM | POA: Diagnosis not present

## 2021-02-22 DIAGNOSIS — N2581 Secondary hyperparathyroidism of renal origin: Secondary | ICD-10-CM | POA: Diagnosis not present

## 2021-02-22 DIAGNOSIS — D689 Coagulation defect, unspecified: Secondary | ICD-10-CM | POA: Diagnosis not present

## 2021-02-22 DIAGNOSIS — N186 End stage renal disease: Secondary | ICD-10-CM | POA: Diagnosis not present

## 2021-02-24 DIAGNOSIS — Z992 Dependence on renal dialysis: Secondary | ICD-10-CM | POA: Diagnosis not present

## 2021-02-24 DIAGNOSIS — D509 Iron deficiency anemia, unspecified: Secondary | ICD-10-CM | POA: Diagnosis not present

## 2021-02-24 DIAGNOSIS — N186 End stage renal disease: Secondary | ICD-10-CM | POA: Diagnosis not present

## 2021-02-24 DIAGNOSIS — N2581 Secondary hyperparathyroidism of renal origin: Secondary | ICD-10-CM | POA: Diagnosis not present

## 2021-02-24 DIAGNOSIS — D689 Coagulation defect, unspecified: Secondary | ICD-10-CM | POA: Diagnosis not present

## 2021-02-24 DIAGNOSIS — D688 Other specified coagulation defects: Secondary | ICD-10-CM | POA: Diagnosis not present

## 2021-02-24 DIAGNOSIS — T8249XA Other complication of vascular dialysis catheter, initial encounter: Secondary | ICD-10-CM | POA: Diagnosis not present

## 2021-02-26 DIAGNOSIS — M1712 Unilateral primary osteoarthritis, left knee: Secondary | ICD-10-CM | POA: Diagnosis not present

## 2021-02-26 DIAGNOSIS — E1143 Type 2 diabetes mellitus with diabetic autonomic (poly)neuropathy: Secondary | ICD-10-CM | POA: Diagnosis not present

## 2021-02-26 DIAGNOSIS — N186 End stage renal disease: Secondary | ICD-10-CM | POA: Diagnosis not present

## 2021-02-26 DIAGNOSIS — Z992 Dependence on renal dialysis: Secondary | ICD-10-CM | POA: Diagnosis not present

## 2021-02-26 DIAGNOSIS — M6281 Muscle weakness (generalized): Secondary | ICD-10-CM | POA: Diagnosis not present

## 2021-02-26 DIAGNOSIS — Z7984 Long term (current) use of oral hypoglycemic drugs: Secondary | ICD-10-CM | POA: Diagnosis not present

## 2021-02-26 DIAGNOSIS — M545 Low back pain, unspecified: Secondary | ICD-10-CM | POA: Diagnosis not present

## 2021-02-26 DIAGNOSIS — R2681 Unsteadiness on feet: Secondary | ICD-10-CM | POA: Diagnosis not present

## 2021-02-27 DIAGNOSIS — D509 Iron deficiency anemia, unspecified: Secondary | ICD-10-CM | POA: Diagnosis not present

## 2021-02-27 DIAGNOSIS — T8249XA Other complication of vascular dialysis catheter, initial encounter: Secondary | ICD-10-CM | POA: Diagnosis not present

## 2021-02-27 DIAGNOSIS — N186 End stage renal disease: Secondary | ICD-10-CM | POA: Diagnosis not present

## 2021-02-27 DIAGNOSIS — N2581 Secondary hyperparathyroidism of renal origin: Secondary | ICD-10-CM | POA: Diagnosis not present

## 2021-02-27 DIAGNOSIS — D688 Other specified coagulation defects: Secondary | ICD-10-CM | POA: Diagnosis not present

## 2021-02-27 DIAGNOSIS — Z992 Dependence on renal dialysis: Secondary | ICD-10-CM | POA: Diagnosis not present

## 2021-02-27 DIAGNOSIS — R197 Diarrhea, unspecified: Secondary | ICD-10-CM | POA: Diagnosis not present

## 2021-02-27 DIAGNOSIS — D689 Coagulation defect, unspecified: Secondary | ICD-10-CM | POA: Diagnosis not present

## 2021-02-28 ENCOUNTER — Encounter: Payer: Self-pay | Admitting: *Deleted

## 2021-02-28 ENCOUNTER — Other Ambulatory Visit: Payer: Self-pay

## 2021-02-28 ENCOUNTER — Other Ambulatory Visit: Payer: Self-pay | Admitting: *Deleted

## 2021-02-28 NOTE — Patient Outreach (Signed)
[facility]) Care Management  02/28/2021  Cindy Burns 1946/09/04 [id number]   [facility] Telephone Assessment/Screen for MD, posthospital referral  Referral Date:10/26/20 Referral Source:[facility] hospital liaison, V Brewer Referral Reason:complex care and disease management follow up calls and assess for further needs Insurance:united [place](united healthcare dual complete (HMO SNP)  Last admissions 3/90/30-0/92/33 acute metabolic encephalopathy, hyperkalemia, ESRD, DM 09/05/20-09/06/20 left against medical advice acute confusion, acute encephalopathy/confusion with differential diagnosis including metabolic encephalopathy, less likely acuteCVA 00/7/62-26/33/35KTGYB metabolic encephalopathy, DM, HTN, ESRD    Follow up  Ramp and HH PT  No progress per pt still having pain and not walking with continued home health [facility]) Physical therapy (PT). She agrees a home ramp will be needed as she is not ambulating well for safety Forms initiated by Hemodialysis (HD) SW but no outreach received to install ramp  HD sessions has become better until 02/27/21 [facility] left on 02/27/21 prior to her HD staff being able to complete her HD session  She had to wait an hour for [facility] to return to pick her up to transport her home Pt wants to speak with the HD staff prior to further care coordination   Dental  Has not visit [facility] [phone]) but still has a scheduled May 09 2021 1145 am appointment and was placed on the waiting list for a possible earlier appointment Denies any dental worsening symptoms at this time   Diabetic clinical trials She states that since the last outreach she has started working with a diabetic clinical trial that is requesting medical information from [place] that is still pending.   [facility] RN CM educated patient on medical release process and provided [place]'s medical record office number as [phone] She did not have the contact person number for the clinical trial with her  She was encouraged to get this information and outreaching to [place]'s medical record office number at [phone] to assist by completing a medical release form She voiced understanding She was also encouraged to outreach to [facility] RN CM if further assistance is needed after her attempts    Chronic obstructive pulmonary disease (COPD)  Audible congestion with clearing of patient throat throughout outreach    Plans Patient agrees to care plan and follow up within the next 30-45 business days Pt encouraged to return a call to [facility] RN CM prn Goals Addressed              This Visit's Progress     Patient Stated   .  ([facility])Follow My Treatment Plan-Chronic Kidney (pt-stated)   On track     Timeframe:  Long-Range Goal Priority:  Medium Start Date:                02/07/21             Expected End Date:         04/05/21              Follow Up Date 03/28/21   - call the doctor or nurse to get help with side effects - keep follow-up appointments     Notes:  02/28/21 HD going well, Continues to work with PT but not ambulating well and continues with pain 02/07/21 doing well at Hemodialysis, reports more convenient, shorter sessions, Denies side effects Keeping appointments Seen by pcp today 02/07/21       Joelene Millin L. [name], RN, BSN,  [place] Coordinator Office number [phone] Main [facility] number [phone] Fax number [phone]

## 2021-03-01 DIAGNOSIS — N2581 Secondary hyperparathyroidism of renal origin: Secondary | ICD-10-CM | POA: Diagnosis not present

## 2021-03-01 DIAGNOSIS — R197 Diarrhea, unspecified: Secondary | ICD-10-CM | POA: Diagnosis not present

## 2021-03-01 DIAGNOSIS — N186 End stage renal disease: Secondary | ICD-10-CM | POA: Diagnosis not present

## 2021-03-01 DIAGNOSIS — T8249XA Other complication of vascular dialysis catheter, initial encounter: Secondary | ICD-10-CM | POA: Diagnosis not present

## 2021-03-01 DIAGNOSIS — D689 Coagulation defect, unspecified: Secondary | ICD-10-CM | POA: Diagnosis not present

## 2021-03-01 DIAGNOSIS — Z992 Dependence on renal dialysis: Secondary | ICD-10-CM | POA: Diagnosis not present

## 2021-03-01 DIAGNOSIS — D688 Other specified coagulation defects: Secondary | ICD-10-CM | POA: Diagnosis not present

## 2021-03-01 DIAGNOSIS — D509 Iron deficiency anemia, unspecified: Secondary | ICD-10-CM | POA: Diagnosis not present

## 2021-03-03 DIAGNOSIS — N2581 Secondary hyperparathyroidism of renal origin: Secondary | ICD-10-CM | POA: Diagnosis not present

## 2021-03-03 DIAGNOSIS — D689 Coagulation defect, unspecified: Secondary | ICD-10-CM | POA: Diagnosis not present

## 2021-03-03 DIAGNOSIS — D509 Iron deficiency anemia, unspecified: Secondary | ICD-10-CM | POA: Diagnosis not present

## 2021-03-03 DIAGNOSIS — N186 End stage renal disease: Secondary | ICD-10-CM | POA: Diagnosis not present

## 2021-03-03 DIAGNOSIS — Z992 Dependence on renal dialysis: Secondary | ICD-10-CM | POA: Diagnosis not present

## 2021-03-03 DIAGNOSIS — T8249XA Other complication of vascular dialysis catheter, initial encounter: Secondary | ICD-10-CM | POA: Diagnosis not present

## 2021-03-03 DIAGNOSIS — R197 Diarrhea, unspecified: Secondary | ICD-10-CM | POA: Diagnosis not present

## 2021-03-03 DIAGNOSIS — D688 Other specified coagulation defects: Secondary | ICD-10-CM | POA: Diagnosis not present

## 2021-03-06 DIAGNOSIS — R2681 Unsteadiness on feet: Secondary | ICD-10-CM | POA: Diagnosis not present

## 2021-03-06 DIAGNOSIS — Z992 Dependence on renal dialysis: Secondary | ICD-10-CM | POA: Diagnosis not present

## 2021-03-06 DIAGNOSIS — M545 Low back pain, unspecified: Secondary | ICD-10-CM | POA: Diagnosis not present

## 2021-03-06 DIAGNOSIS — M1712 Unilateral primary osteoarthritis, left knee: Secondary | ICD-10-CM | POA: Diagnosis not present

## 2021-03-06 DIAGNOSIS — N186 End stage renal disease: Secondary | ICD-10-CM | POA: Diagnosis not present

## 2021-03-06 DIAGNOSIS — Z7984 Long term (current) use of oral hypoglycemic drugs: Secondary | ICD-10-CM | POA: Diagnosis not present

## 2021-03-06 DIAGNOSIS — E1143 Type 2 diabetes mellitus with diabetic autonomic (poly)neuropathy: Secondary | ICD-10-CM | POA: Diagnosis not present

## 2021-03-06 DIAGNOSIS — N179 Acute kidney failure, unspecified: Secondary | ICD-10-CM | POA: Diagnosis not present

## 2021-03-06 DIAGNOSIS — M6281 Muscle weakness (generalized): Secondary | ICD-10-CM | POA: Diagnosis not present

## 2021-03-08 DIAGNOSIS — D688 Other specified coagulation defects: Secondary | ICD-10-CM | POA: Diagnosis not present

## 2021-03-08 DIAGNOSIS — N186 End stage renal disease: Secondary | ICD-10-CM | POA: Diagnosis not present

## 2021-03-08 DIAGNOSIS — N2581 Secondary hyperparathyroidism of renal origin: Secondary | ICD-10-CM | POA: Diagnosis not present

## 2021-03-08 DIAGNOSIS — D689 Coagulation defect, unspecified: Secondary | ICD-10-CM | POA: Diagnosis not present

## 2021-03-08 DIAGNOSIS — T8249XA Other complication of vascular dialysis catheter, initial encounter: Secondary | ICD-10-CM | POA: Diagnosis not present

## 2021-03-08 DIAGNOSIS — R197 Diarrhea, unspecified: Secondary | ICD-10-CM | POA: Diagnosis not present

## 2021-03-08 DIAGNOSIS — Z992 Dependence on renal dialysis: Secondary | ICD-10-CM | POA: Diagnosis not present

## 2021-03-10 DIAGNOSIS — Z992 Dependence on renal dialysis: Secondary | ICD-10-CM | POA: Diagnosis not present

## 2021-03-10 DIAGNOSIS — R197 Diarrhea, unspecified: Secondary | ICD-10-CM | POA: Diagnosis not present

## 2021-03-10 DIAGNOSIS — T8249XA Other complication of vascular dialysis catheter, initial encounter: Secondary | ICD-10-CM | POA: Diagnosis not present

## 2021-03-10 DIAGNOSIS — N186 End stage renal disease: Secondary | ICD-10-CM | POA: Diagnosis not present

## 2021-03-10 DIAGNOSIS — D689 Coagulation defect, unspecified: Secondary | ICD-10-CM | POA: Diagnosis not present

## 2021-03-10 DIAGNOSIS — D688 Other specified coagulation defects: Secondary | ICD-10-CM | POA: Diagnosis not present

## 2021-03-10 DIAGNOSIS — N2581 Secondary hyperparathyroidism of renal origin: Secondary | ICD-10-CM | POA: Diagnosis not present

## 2021-03-12 DIAGNOSIS — Z7984 Long term (current) use of oral hypoglycemic drugs: Secondary | ICD-10-CM | POA: Diagnosis not present

## 2021-03-12 DIAGNOSIS — M6281 Muscle weakness (generalized): Secondary | ICD-10-CM | POA: Diagnosis not present

## 2021-03-12 DIAGNOSIS — Z992 Dependence on renal dialysis: Secondary | ICD-10-CM | POA: Diagnosis not present

## 2021-03-12 DIAGNOSIS — R2681 Unsteadiness on feet: Secondary | ICD-10-CM | POA: Diagnosis not present

## 2021-03-12 DIAGNOSIS — E1143 Type 2 diabetes mellitus with diabetic autonomic (poly)neuropathy: Secondary | ICD-10-CM | POA: Diagnosis not present

## 2021-03-12 DIAGNOSIS — M1712 Unilateral primary osteoarthritis, left knee: Secondary | ICD-10-CM | POA: Diagnosis not present

## 2021-03-12 DIAGNOSIS — M545 Low back pain, unspecified: Secondary | ICD-10-CM | POA: Diagnosis not present

## 2021-03-12 DIAGNOSIS — N186 End stage renal disease: Secondary | ICD-10-CM | POA: Diagnosis not present

## 2021-03-13 DIAGNOSIS — R2681 Unsteadiness on feet: Secondary | ICD-10-CM | POA: Diagnosis not present

## 2021-03-13 DIAGNOSIS — Z7984 Long term (current) use of oral hypoglycemic drugs: Secondary | ICD-10-CM | POA: Diagnosis not present

## 2021-03-13 DIAGNOSIS — M545 Low back pain, unspecified: Secondary | ICD-10-CM | POA: Diagnosis not present

## 2021-03-13 DIAGNOSIS — E1143 Type 2 diabetes mellitus with diabetic autonomic (poly)neuropathy: Secondary | ICD-10-CM | POA: Diagnosis not present

## 2021-03-13 DIAGNOSIS — M1712 Unilateral primary osteoarthritis, left knee: Secondary | ICD-10-CM | POA: Diagnosis not present

## 2021-03-13 DIAGNOSIS — N186 End stage renal disease: Secondary | ICD-10-CM | POA: Diagnosis not present

## 2021-03-13 DIAGNOSIS — Z992 Dependence on renal dialysis: Secondary | ICD-10-CM | POA: Diagnosis not present

## 2021-03-13 DIAGNOSIS — M6281 Muscle weakness (generalized): Secondary | ICD-10-CM | POA: Diagnosis not present

## 2021-03-15 ENCOUNTER — Telehealth: Payer: Self-pay | Admitting: Family Medicine

## 2021-03-15 DIAGNOSIS — D649 Anemia, unspecified: Secondary | ICD-10-CM

## 2021-03-15 DIAGNOSIS — D689 Coagulation defect, unspecified: Secondary | ICD-10-CM | POA: Diagnosis not present

## 2021-03-15 DIAGNOSIS — E1121 Type 2 diabetes mellitus with diabetic nephropathy: Secondary | ICD-10-CM

## 2021-03-15 DIAGNOSIS — N186 End stage renal disease: Secondary | ICD-10-CM | POA: Diagnosis not present

## 2021-03-15 DIAGNOSIS — D688 Other specified coagulation defects: Secondary | ICD-10-CM | POA: Diagnosis not present

## 2021-03-15 DIAGNOSIS — Z992 Dependence on renal dialysis: Secondary | ICD-10-CM | POA: Diagnosis not present

## 2021-03-15 DIAGNOSIS — E538 Deficiency of other specified B group vitamins: Secondary | ICD-10-CM

## 2021-03-15 DIAGNOSIS — T8249XA Other complication of vascular dialysis catheter, initial encounter: Secondary | ICD-10-CM | POA: Diagnosis not present

## 2021-03-15 DIAGNOSIS — N2581 Secondary hyperparathyroidism of renal origin: Secondary | ICD-10-CM | POA: Diagnosis not present

## 2021-03-15 DIAGNOSIS — R197 Diarrhea, unspecified: Secondary | ICD-10-CM | POA: Diagnosis not present

## 2021-03-15 NOTE — Telephone Encounter (Signed)
She just had all her labs done with a study and they are in the chart... she can cancel the lab appt.

## 2021-03-15 NOTE — Telephone Encounter (Signed)
-----   Message from Cloyd Stagers, RT sent at 02/26/2021  2:28 PM EDT ----- Regarding: Lab Orders for Friday 6.10.2022 Please place lab orders for Friday 6.10.2022, office visit for physical on Friday 6.17.2022  There are orders from 06/2020, just wanted to clarify if those are the orders we need  Thanks Tam

## 2021-03-16 ENCOUNTER — Other Ambulatory Visit: Payer: Self-pay | Admitting: *Deleted

## 2021-03-16 ENCOUNTER — Other Ambulatory Visit: Payer: Medicare Other

## 2021-03-16 ENCOUNTER — Telehealth: Payer: Self-pay

## 2021-03-16 NOTE — Patient Outreach (Signed)
[facility]) Care Management  03/16/2021  SHAWNISE PETERKIN Nov 10, 1945 [id number]   [place] coordination  Outreach to Freda Jackson at [facility] [place] [phone] extension 16 Discussed with [name] pt's [facility] goals to include home ramp,  Ella shared ramp information with pt but pt has not followed up  Plan follow up within the next 30-45 business days  Magdelena Kinsella L. [name], RN, BSN, [place] Coordinator Office number [phone] Mobile number [phone]  Main [facility] number [phone] Fax number [phone]

## 2021-03-16 NOTE — Telephone Encounter (Signed)
LVM notifying pt that per Dr Diona Browner she does not need to come in for lab work today

## 2021-03-17 DIAGNOSIS — R197 Diarrhea, unspecified: Secondary | ICD-10-CM | POA: Diagnosis not present

## 2021-03-17 DIAGNOSIS — Z992 Dependence on renal dialysis: Secondary | ICD-10-CM | POA: Diagnosis not present

## 2021-03-17 DIAGNOSIS — N2581 Secondary hyperparathyroidism of renal origin: Secondary | ICD-10-CM | POA: Diagnosis not present

## 2021-03-17 DIAGNOSIS — D649 Anemia, unspecified: Secondary | ICD-10-CM | POA: Diagnosis not present

## 2021-03-17 DIAGNOSIS — T8249XA Other complication of vascular dialysis catheter, initial encounter: Secondary | ICD-10-CM | POA: Diagnosis not present

## 2021-03-17 DIAGNOSIS — N186 End stage renal disease: Secondary | ICD-10-CM | POA: Diagnosis not present

## 2021-03-17 DIAGNOSIS — D688 Other specified coagulation defects: Secondary | ICD-10-CM | POA: Diagnosis not present

## 2021-03-17 DIAGNOSIS — D689 Coagulation defect, unspecified: Secondary | ICD-10-CM | POA: Diagnosis not present

## 2021-03-20 DIAGNOSIS — D689 Coagulation defect, unspecified: Secondary | ICD-10-CM | POA: Diagnosis not present

## 2021-03-20 DIAGNOSIS — D688 Other specified coagulation defects: Secondary | ICD-10-CM | POA: Diagnosis not present

## 2021-03-20 DIAGNOSIS — D509 Iron deficiency anemia, unspecified: Secondary | ICD-10-CM | POA: Diagnosis not present

## 2021-03-20 DIAGNOSIS — R197 Diarrhea, unspecified: Secondary | ICD-10-CM | POA: Diagnosis not present

## 2021-03-20 DIAGNOSIS — N2581 Secondary hyperparathyroidism of renal origin: Secondary | ICD-10-CM | POA: Diagnosis not present

## 2021-03-20 DIAGNOSIS — N186 End stage renal disease: Secondary | ICD-10-CM | POA: Diagnosis not present

## 2021-03-20 DIAGNOSIS — T8249XA Other complication of vascular dialysis catheter, initial encounter: Secondary | ICD-10-CM | POA: Diagnosis not present

## 2021-03-20 DIAGNOSIS — Z992 Dependence on renal dialysis: Secondary | ICD-10-CM | POA: Diagnosis not present

## 2021-03-21 DIAGNOSIS — N186 End stage renal disease: Secondary | ICD-10-CM | POA: Diagnosis not present

## 2021-03-21 DIAGNOSIS — M545 Low back pain, unspecified: Secondary | ICD-10-CM | POA: Diagnosis not present

## 2021-03-21 DIAGNOSIS — Z992 Dependence on renal dialysis: Secondary | ICD-10-CM | POA: Diagnosis not present

## 2021-03-21 DIAGNOSIS — M1712 Unilateral primary osteoarthritis, left knee: Secondary | ICD-10-CM | POA: Diagnosis not present

## 2021-03-21 DIAGNOSIS — E1143 Type 2 diabetes mellitus with diabetic autonomic (poly)neuropathy: Secondary | ICD-10-CM | POA: Diagnosis not present

## 2021-03-21 DIAGNOSIS — R2681 Unsteadiness on feet: Secondary | ICD-10-CM | POA: Diagnosis not present

## 2021-03-21 DIAGNOSIS — Z7984 Long term (current) use of oral hypoglycemic drugs: Secondary | ICD-10-CM | POA: Diagnosis not present

## 2021-03-21 DIAGNOSIS — M6281 Muscle weakness (generalized): Secondary | ICD-10-CM | POA: Diagnosis not present

## 2021-03-23 ENCOUNTER — Encounter: Payer: Self-pay | Admitting: Family Medicine

## 2021-03-23 ENCOUNTER — Ambulatory Visit (INDEPENDENT_AMBULATORY_CARE_PROVIDER_SITE_OTHER): Payer: Medicare Other | Admitting: Family Medicine

## 2021-03-23 ENCOUNTER — Other Ambulatory Visit: Payer: Self-pay

## 2021-03-23 VITALS — BP 172/79 | HR 77 | Temp 97.6°F | Ht 60.25 in | Wt 111.2 lb

## 2021-03-23 DIAGNOSIS — Z0000 Encounter for general adult medical examination without abnormal findings: Secondary | ICD-10-CM

## 2021-03-23 DIAGNOSIS — J449 Chronic obstructive pulmonary disease, unspecified: Secondary | ICD-10-CM | POA: Diagnosis not present

## 2021-03-23 DIAGNOSIS — Z992 Dependence on renal dialysis: Secondary | ICD-10-CM | POA: Diagnosis not present

## 2021-03-23 DIAGNOSIS — E1159 Type 2 diabetes mellitus with other circulatory complications: Secondary | ICD-10-CM

## 2021-03-23 DIAGNOSIS — I152 Hypertension secondary to endocrine disorders: Secondary | ICD-10-CM | POA: Diagnosis not present

## 2021-03-23 DIAGNOSIS — E1169 Type 2 diabetes mellitus with other specified complication: Secondary | ICD-10-CM | POA: Diagnosis not present

## 2021-03-23 DIAGNOSIS — N179 Acute kidney failure, unspecified: Secondary | ICD-10-CM | POA: Diagnosis not present

## 2021-03-23 DIAGNOSIS — F331 Major depressive disorder, recurrent, moderate: Secondary | ICD-10-CM

## 2021-03-23 DIAGNOSIS — E785 Hyperlipidemia, unspecified: Secondary | ICD-10-CM

## 2021-03-23 DIAGNOSIS — E2839 Other primary ovarian failure: Secondary | ICD-10-CM

## 2021-03-23 DIAGNOSIS — E1143 Type 2 diabetes mellitus with diabetic autonomic (poly)neuropathy: Secondary | ICD-10-CM

## 2021-03-23 DIAGNOSIS — N186 End stage renal disease: Secondary | ICD-10-CM

## 2021-03-23 DIAGNOSIS — E1121 Type 2 diabetes mellitus with diabetic nephropathy: Secondary | ICD-10-CM

## 2021-03-23 NOTE — Assessment & Plan Note (Signed)
On dialysis. Followed by nephrology.

## 2021-03-23 NOTE — Assessment & Plan Note (Signed)
Stable control on current regimen.  Secondary to DM.

## 2021-03-23 NOTE — Assessment & Plan Note (Signed)
Stable, chronic.  Continue current medication. Lab Results  Component Value Date   CHOL 131 09/06/2020   HDL 41 09/06/2020   LDLCALC 65 09/06/2020   LDLDIRECT 163.0 12/11/2018   TRIG 124 09/06/2020   CHOLHDL 3.2 09/06/2020       LDL previously at goal on crestor

## 2021-03-23 NOTE — Assessment & Plan Note (Signed)
Stable symptoms.[name] only using albuterol prn.

## 2021-03-23 NOTE — Assessment & Plan Note (Signed)
good   control  on venlfaxine 37.5 mg  PHQ9; 8 Some issues with sleep.

## 2021-03-23 NOTE — Assessment & Plan Note (Addendum)
Stable control at home, chronic.  Continue current medication.   At home  With PT147/80... low normal with dialysis.   Amlodipine 10 mg daily  Metoprolol 25 mg BID.

## 2021-03-23 NOTE — Progress Notes (Signed)
Patient ID: Cindy Burns, female    DOB: 1946-08-20, 75 y.o.   MRN: [id number]  This visit was conducted in person.  BP (!) 180/80   Pulse 91   Temp 97.6 F (36.4 C) (Temporal)   Ht 5' 0.25" (1.53 m)   Wt 111 lb 4 oz (50.5 kg)   SpO2 95%   BMI 21.55 kg/m    CC:  Chief Complaint  Patient presents with  . Medicare Wellness    Subjective:   HPI: Cindy Burns is a 75 y.o. female presenting on 03/23/2021 for Medicare Wellness  The patient presents for annual medicare wellness, complete physical and review of chronic health problems. He/She also has the following acute concerns today: none  I have personally reviewed the Medicare Annual Wellness questionnaire and have noted 1. The patient's medical and social history 2. Their use of alcohol, tobacco or illicit drugs 3. Their current medications and supplements 4. The patient's functional ability including ADL's, fall risks, home safety risks and hearing or visual             impairment. 5. Diet and physical activities 6. Evidence for depression or mood disorders 7.         Updated provider list Cognitive evaluation was performed and recorded on pt medicare questionnaire form. The patients weight, height, BMI and visual acuity have been recorded in the chart   I have made referrals, counseling and provided education to the patient based review of the above and I have provided the pt with a written personalized care plan for preventive services.   Documentation of this information was scanned into the electronic record under the media tab.  Advance directives and end of life planning reviewed in detail with patient and documented in EMR. Patient given handout on advance care directives if needed. HCPOA and living will updated if needed.  Fall Risk  03/23/2021 03/01/2020 02/23/2020 04/30/2019 04/03/2017  Falls in the past year? 1 0 0 0 No  Number falls in past yr: 1 0 0 - -  Injury with Fall? 0 0 0 - -  Comment - - N/A- no  falls reported - -  Risk for fall due to : - Medication side effect;Impaired balance/gait Medication side effect;Other (Comment) - -  Risk for fall due to: Comment - - weakness post- recent extended hospitalization - -  Follow up - Education provided;Falls prevention discussed Falls prevention discussed - -   Hearing Screening  Method: Audiometry   500Hz  1000Hz  2000Hz  4000Hz   Right ear 20 20 20  0  Left ear 20 20 20 25   Vision Screening - Comments:: Eye Exam 01/2021 at [place] in [facility] Patient Outreach Telephone from 02/28/2021 in [place] Coordination  PHQ-2 Total Score 0       Pt in study.. labs reviewed in detail from study Cholesterol results blinded. On crestor. Hg 11.7  Diabetes: A1C4.3  On januvia 25 mg daily.. renal dose. Using medications without difficulties: Hypoglycemic episodes: Hyperglycemic episodes: Feet problems:none Blood Sugars averaging: FBS in test 224, FBS at home `118-158 eye exam within last year: done   ESRD:on dialysis    COPD:  Albuterol twice daily prn.  She states Spiriva and Advair did not help in the past. Cost is an issue as well.  MDD, chronic insomnia:  good   control  on venlfaxine 37.5 mg  PHQ9; 8 Some issues with sleep.  Relevant past medical, surgical, family and social  history reviewed and updated as indicated. Interim medical history since our last visit reviewed. Allergies and medications reviewed and updated. Outpatient Medications Prior to Visit  Medication Sig Dispense Refill  . albuterol (VENTOLIN HFA) 108 (90 Base) MCG/ACT inhaler TAKE 2 PUFFS BY MOUTH EVERY 6 HOURS AS NEEDED FOR WHEEZE OR SHORTNESS OF BREATH 8.5 each 2  . amLODipine (NORVASC) 10 MG tablet Take 1 tablet (10 mg total) by mouth daily. 30 tablet 0  . BAYER LOW DOSE 81 MG EC tablet Take 81 mg by mouth in the morning. Swallow whole.    . diclofenac Sodium (VOLTAREN) 1 % GEL Apply 2-4 g topically 4 (four) times daily  as needed (to painful sites).     . folic acid (FOLVITE) 1 MG tablet Take 1 tablet (1 mg total) by mouth daily. 30 tablet 3  . gabapentin (NEURONTIN) 300 MG capsule TAKE 1 CAPSULE BY MOUTH AFTER DIALYSIS ON DIALYSIS ON DAYS 36 capsule 3  . ipratropium-albuterol (DUONEB) 0.5-2.5 (3) MG/3ML SOLN Take 3 mLs by nebulization every 6 (six) hours as needed. 360 mL 0  . iron sucrose in sodium chloride 0.9 % 100 mL Inject into the vein.    [name] JANUVIA 25 MG tablet TAKE 1 TABLET BY MOUTH EVERY DAY 30 tablet 3  . LOPERAMIDE HCL PO Take by mouth.    . Methoxy PEG-Epoetin Beta (MIRCERA IJ) Mircera    . rosuvastatin (CRESTOR) 20 MG tablet TAKE 1 TABLET BY MOUTH EVERY DAY 90 tablet 3  . sevelamer carbonate (RENVELA) 800 MG tablet Take 1,600 mg by mouth See admin instructions. Take 1,600 mg by mouth with meals (when eating)    . sodium bicarbonate 650 MG tablet Take 2 tablets (1,300 mg total) by mouth 3 (three) times daily. 90 tablet 0  . traMADol (ULTRAM) 50 MG tablet TAKE 1/2 TABLET BY MOUTH TWICE A DAY AS NEEDED 23 tablet 1  . VELPHORO 500 MG chewable tablet Chew 500 mg by mouth 3 (three) times daily.    [name] venlafaxine XR (EFFEXOR-XR) 37.5 MG 24 hr capsule TAKE 1 CAPSULE (37.5 MG TOTAL) BY MOUTH AT BEDTIME. 90 capsule 0  . metoprolol tartrate (LOPRESSOR) 25 MG tablet Take 1 tablet (25 mg total) by mouth 2 (two) times daily. 60 tablet 5   No facility-administered medications prior to visit.     Per HPI unless specifically indicated in ROS section below Review of Systems  Constitutional:  Negative for fatigue and fever.  HENT:  Negative for congestion.   Eyes:  Negative for pain.  Respiratory:  Negative for cough and shortness of breath.   Cardiovascular:  Negative for chest pain, palpitations and leg swelling.  Gastrointestinal:  Negative for abdominal pain.  Genitourinary:  Negative for dysuria and vaginal bleeding.  Musculoskeletal:  Negative for back pain.  Neurological:  Negative for syncope,  light-headedness and headaches.  Psychiatric/Behavioral:  Negative for dysphoric mood.   Objective:  BP (!) 180/80   Pulse 91   Temp 97.6 F (36.4 C) (Temporal)   Ht 5' 0.25" (1.53 m)   Wt 111 lb 4 oz (50.5 kg)   SpO2 95%   BMI 21.55 kg/m   Wt Readings from Last 3 Encounters:  03/23/21 111 lb 4 oz (50.5 kg)  02/07/21 111 lb (50.3 kg)  12/08/20 112 lb 12 oz (51.1 kg)      Physical Exam Constitutional:      General: She is not in acute distress.    Appearance: Normal appearance. She is well-developed.  She is not ill-appearing or toxic-appearing.  HENT:     Head: Normocephalic.     Right Ear: Hearing, tympanic membrane, ear canal and external ear normal.     Left Ear: Hearing, tympanic membrane, ear canal and external ear normal.     Nose: Nose normal.  Eyes:     General: Lids are normal. Lids are everted, no foreign bodies appreciated.     Conjunctiva/sclera: Conjunctivae normal.     Pupils: Pupils are equal, round, and reactive to light.  Neck:     Thyroid: No thyroid mass or thyromegaly.     Vascular: No carotid bruit.     Trachea: Trachea normal.  Cardiovascular:     Rate and Rhythm: Normal rate and regular rhythm.     Heart sounds: Normal heart sounds, S1 normal and S2 normal. No murmur heard.   No gallop.  Pulmonary:     Effort: Pulmonary effort is normal. No respiratory distress.     Breath sounds: Normal breath sounds. No wheezing, rhonchi or rales.  Abdominal:     General: Bowel sounds are normal. There is no distension or abdominal bruit.     Palpations: Abdomen is soft. There is no fluid wave or mass.     Tenderness: There is no abdominal tenderness. There is no guarding or rebound.     Hernia: No hernia is present.  Musculoskeletal:     Cervical back: Normal range of motion and neck supple.  Lymphadenopathy:     Cervical: No cervical adenopathy.  Skin:    General: Skin is warm and dry.     Findings: No rash.  Neurological:     Mental Status: She is  alert.     Cranial Nerves: No cranial nerve deficit.     Sensory: No sensory deficit.     Comments:  Using cane, weakness in legs, antalgic gait.  Psychiatric:        Mood and Affect: Mood is not anxious or depressed.        Speech: Speech normal.        Behavior: Behavior normal. Behavior is cooperative.        Judgment: Judgment normal.      Results for orders placed or performed in visit on 02/08/21  HM DIABETES EYE EXAM  Result Value Ref Range   HM Diabetic Eye Exam No Retinopathy No Retinopathy    This visit occurred during the SARS-CoV-2 public health emergency.  Safety protocols were in place, including screening questions prior to the visit, additional usage of staff PPE, and extensive cleaning of exam room while observing appropriate contact time as indicated for disinfecting solutions.   COVID 19 screen:  No recent travel or known exposure to COVID19 The patient denies respiratory symptoms of COVID 19 at this time. The importance of social distancing was discussed today.   Assessment and Plan The patient's preventative maintenance and recommended screening tests for an annual wellness exam were reviewed in full today. Brought up to date unless services declined.  Counselled on the importance of diet, exercise, and its role in overall health and mortality. The patient's FH and SH was reviewed, including their home life, tobacco status, and drug and alcohol status.   Vaccines:Due for shingrix.[name] refused. Pap/DVE: not indicated Mammo: due.. refused Bone Density:due Colon: ifob yearly.. due Smoking Status:former smoker ETOH/ drug UXN:ATFT/DDUK   Problem List Items Addressed This Visit     ESRD (end stage renal disease) ([place]) (Chronic)    On dialysis  Hypertension associated with diabetes ([place]) (Chronic)    Stable control at home, chronic.  Continue current medication.   At home  With PT147/80... low normal with dialysis.   Amlodipine 10 mg daily   Metoprolol 25 mg BID.       RESOLVED: Acute renal failure on dialysis [facility])    On dialysis. Followed by nephrology.       Diabetic autonomic neuropathy (HCC)    Stable control on current regimen.  Secondary to DM.       Hyperlipidemia associated with type 2 diabetes mellitus (HCC)    Stable, chronic.  Continue current medication. Lab Results  Component Value Date   CHOL 131 09/06/2020   HDL 41 09/06/2020   LDLCALC 65 09/06/2020   LDLDIRECT 163.0 12/11/2018   TRIG 124 09/06/2020   CHOLHDL 3.2 09/06/2020      LDL previously at goal on crestor       Major depressive disorder, recurrent episode, moderate (HCC)    good   control  on venlfaxine 37.5 mg  PHQ9; 8 Some issues with sleep.       Moderate COPD (chronic obstructive pulmonary disease) (HCC)    Stable symptoms.[name] only using albuterol prn.       Type 2 diabetes with nephropathy ([place])   Other Visit Diagnoses     Medicare annual wellness visit, subsequent    -  Primary   Estrogen deficiency       Relevant Orders   DG Bone Density          Eliezer Lofts, MD

## 2021-03-23 NOTE — Assessment & Plan Note (Signed)
On dialysis

## 2021-03-23 NOTE — Patient Instructions (Addendum)
Please call the location of your choice from the menu below to schedule your Mammogram and/or Bone Density appointment.     [place] at [facility]   Phone:  [phone]   [address] [place], [place] [address]                                            Services: 3D Mammogram and [place]  [place] at [facility])  Phone:  [phone]   [address] [place], [place] [address]                                              Services:  3D Mammogram and Bone Density    Call to set up stool testing for colon cancer screening .. with [employer]... have them send [place] the results.

## 2021-03-24 DIAGNOSIS — R197 Diarrhea, unspecified: Secondary | ICD-10-CM | POA: Diagnosis not present

## 2021-03-24 DIAGNOSIS — N2581 Secondary hyperparathyroidism of renal origin: Secondary | ICD-10-CM | POA: Diagnosis not present

## 2021-03-24 DIAGNOSIS — D689 Coagulation defect, unspecified: Secondary | ICD-10-CM | POA: Diagnosis not present

## 2021-03-24 DIAGNOSIS — D688 Other specified coagulation defects: Secondary | ICD-10-CM | POA: Diagnosis not present

## 2021-03-24 DIAGNOSIS — T8249XA Other complication of vascular dialysis catheter, initial encounter: Secondary | ICD-10-CM | POA: Diagnosis not present

## 2021-03-24 DIAGNOSIS — D509 Iron deficiency anemia, unspecified: Secondary | ICD-10-CM | POA: Diagnosis not present

## 2021-03-24 DIAGNOSIS — Z992 Dependence on renal dialysis: Secondary | ICD-10-CM | POA: Diagnosis not present

## 2021-03-24 DIAGNOSIS — N186 End stage renal disease: Secondary | ICD-10-CM | POA: Diagnosis not present

## 2021-03-25 ENCOUNTER — Other Ambulatory Visit: Payer: Self-pay | Admitting: Family Medicine

## 2021-03-28 ENCOUNTER — Other Ambulatory Visit: Payer: Self-pay | Admitting: *Deleted

## 2021-03-28 ENCOUNTER — Other Ambulatory Visit: Payer: Self-pay

## 2021-03-28 NOTE — Patient Outreach (Addendum)
[facility]) Care Management  03/28/2021  BESS SALTZMAN January 19, 1946 [id number]   [facility] Telephone Assessment/Screen for MD,  post hospital referral   Referral Date: 10/26/20  Referral Source: [facility] hospital liaison, Eliott Nine Referral Reason: complex care and disease management follow up calls and assess for further needs  Insurance: united [employer] [place] (united healthcare dual complete (HMO SNP)     Last admissions 1/82/88-3/37/44 acute metabolic encephalopathy, hyperkalemia, ESRD, DM  09/05/20-09/06/20 left against medical advice acute confusion, acute encephalopathy/confusion with differential diagnosis including metabolic encephalopathy, less likely acute CVA 51/4/60-47/99/87 Acute metabolic encephalopathy, DM, HTN, ESRD       Follow up  [place] came out to visit on last week to complete the measurements- pending building of the ramp soon - goal met   Dental she called and scheduled visit April 15 2021 [facility] [place]- goal met  75 year old Water well pump "went out" stopped working missed Hemodialysis (HD) 03/27/21 & anticipate missing  Has outreached to [employer] $320 for pipes and motor replacement This has been a 2 week period process  Diabetes  Cbg 158-< 200 Managing at home  Working with dietitian  Taking in more tomatoes or watermelons and taking phosphorous after intake prn    Past Medical History:  Diagnosis Date   AKI (acute kidney injury) ([place]) 01/2020   Cancer ([place]) 2005   Breast Cancer   COPD (chronic obstructive pulmonary disease) ([place])    Depression    Diabetes mellitus without complication ([place])    Hyperlipidemia     Plans Patient agrees to care plan and follow up within the next 30-45 business days Pt encouraged to return a call to [place] CM prn  Goals Addressed               This Visit's Progress     Patient Stated     (THN)Follow My Treatment Plan-Chronic Kidney  (pt-stated)   On track     Timeframe:  Long-Range Goal Priority:  Medium Start Date:                02/07/21             Expected End Date:         05/05/21              Follow Up Date 04/30/21 Barriers: Knowledge    - call the doctor or nurse to get help with side effects - keep follow-up appointments      Notes:  03/28/21 Doing well in HD, no worsening symptoms, has made appointments for dental will miss to days of HD this week related to a water well repair 02/28/21 HD going well, Continues to work with PT but not ambulating well and continues with pain 02/07/21 doing well at Hemodialysis, reports more convenient, shorter sessions, Denies side effects Keeping appointments Seen by pcp today 02/07/21          Joelene Millin L. [name], RN, BSN, [place] Coordinator Office number [phone] Main [facility] number [phone] Fax number [phone] hd

## 2021-03-29 ENCOUNTER — Other Ambulatory Visit: Payer: Self-pay | Admitting: Family Medicine

## 2021-03-30 DIAGNOSIS — Z7984 Long term (current) use of oral hypoglycemic drugs: Secondary | ICD-10-CM | POA: Diagnosis not present

## 2021-03-30 DIAGNOSIS — M545 Low back pain, unspecified: Secondary | ICD-10-CM | POA: Diagnosis not present

## 2021-03-30 DIAGNOSIS — N186 End stage renal disease: Secondary | ICD-10-CM | POA: Diagnosis not present

## 2021-03-30 DIAGNOSIS — R2681 Unsteadiness on feet: Secondary | ICD-10-CM | POA: Diagnosis not present

## 2021-03-30 DIAGNOSIS — M6281 Muscle weakness (generalized): Secondary | ICD-10-CM | POA: Diagnosis not present

## 2021-03-30 DIAGNOSIS — Z992 Dependence on renal dialysis: Secondary | ICD-10-CM | POA: Diagnosis not present

## 2021-03-30 DIAGNOSIS — E1143 Type 2 diabetes mellitus with diabetic autonomic (poly)neuropathy: Secondary | ICD-10-CM | POA: Diagnosis not present

## 2021-03-30 DIAGNOSIS — M1712 Unilateral primary osteoarthritis, left knee: Secondary | ICD-10-CM | POA: Diagnosis not present

## 2021-03-31 DIAGNOSIS — N186 End stage renal disease: Secondary | ICD-10-CM | POA: Diagnosis not present

## 2021-03-31 DIAGNOSIS — N2581 Secondary hyperparathyroidism of renal origin: Secondary | ICD-10-CM | POA: Diagnosis not present

## 2021-03-31 DIAGNOSIS — D688 Other specified coagulation defects: Secondary | ICD-10-CM | POA: Diagnosis not present

## 2021-03-31 DIAGNOSIS — Z992 Dependence on renal dialysis: Secondary | ICD-10-CM | POA: Diagnosis not present

## 2021-03-31 DIAGNOSIS — D689 Coagulation defect, unspecified: Secondary | ICD-10-CM | POA: Diagnosis not present

## 2021-03-31 DIAGNOSIS — T8249XA Other complication of vascular dialysis catheter, initial encounter: Secondary | ICD-10-CM | POA: Diagnosis not present

## 2021-03-31 DIAGNOSIS — D509 Iron deficiency anemia, unspecified: Secondary | ICD-10-CM | POA: Diagnosis not present

## 2021-03-31 DIAGNOSIS — R197 Diarrhea, unspecified: Secondary | ICD-10-CM | POA: Diagnosis not present

## 2021-04-05 DIAGNOSIS — D689 Coagulation defect, unspecified: Secondary | ICD-10-CM | POA: Diagnosis not present

## 2021-04-05 DIAGNOSIS — N2581 Secondary hyperparathyroidism of renal origin: Secondary | ICD-10-CM | POA: Diagnosis not present

## 2021-04-05 DIAGNOSIS — T8249XA Other complication of vascular dialysis catheter, initial encounter: Secondary | ICD-10-CM | POA: Diagnosis not present

## 2021-04-05 DIAGNOSIS — Z992 Dependence on renal dialysis: Secondary | ICD-10-CM | POA: Diagnosis not present

## 2021-04-05 DIAGNOSIS — N186 End stage renal disease: Secondary | ICD-10-CM | POA: Diagnosis not present

## 2021-04-05 DIAGNOSIS — D509 Iron deficiency anemia, unspecified: Secondary | ICD-10-CM | POA: Diagnosis not present

## 2021-04-05 DIAGNOSIS — R197 Diarrhea, unspecified: Secondary | ICD-10-CM | POA: Diagnosis not present

## 2021-04-05 DIAGNOSIS — D688 Other specified coagulation defects: Secondary | ICD-10-CM | POA: Diagnosis not present

## 2021-04-10 DIAGNOSIS — N2581 Secondary hyperparathyroidism of renal origin: Secondary | ICD-10-CM | POA: Diagnosis not present

## 2021-04-10 DIAGNOSIS — D689 Coagulation defect, unspecified: Secondary | ICD-10-CM | POA: Diagnosis not present

## 2021-04-10 DIAGNOSIS — T8249XA Other complication of vascular dialysis catheter, initial encounter: Secondary | ICD-10-CM | POA: Diagnosis not present

## 2021-04-10 DIAGNOSIS — N186 End stage renal disease: Secondary | ICD-10-CM | POA: Diagnosis not present

## 2021-04-10 DIAGNOSIS — R197 Diarrhea, unspecified: Secondary | ICD-10-CM | POA: Diagnosis not present

## 2021-04-10 DIAGNOSIS — D688 Other specified coagulation defects: Secondary | ICD-10-CM | POA: Diagnosis not present

## 2021-04-10 DIAGNOSIS — Z992 Dependence on renal dialysis: Secondary | ICD-10-CM | POA: Diagnosis not present

## 2021-04-10 DIAGNOSIS — D509 Iron deficiency anemia, unspecified: Secondary | ICD-10-CM | POA: Diagnosis not present

## 2021-04-10 DIAGNOSIS — E1129 Type 2 diabetes mellitus with other diabetic kidney complication: Secondary | ICD-10-CM | POA: Diagnosis not present

## 2021-04-14 DIAGNOSIS — D688 Other specified coagulation defects: Secondary | ICD-10-CM | POA: Diagnosis not present

## 2021-04-14 DIAGNOSIS — D689 Coagulation defect, unspecified: Secondary | ICD-10-CM | POA: Diagnosis not present

## 2021-04-14 DIAGNOSIS — E1129 Type 2 diabetes mellitus with other diabetic kidney complication: Secondary | ICD-10-CM | POA: Diagnosis not present

## 2021-04-14 DIAGNOSIS — T8249XA Other complication of vascular dialysis catheter, initial encounter: Secondary | ICD-10-CM | POA: Diagnosis not present

## 2021-04-14 DIAGNOSIS — D509 Iron deficiency anemia, unspecified: Secondary | ICD-10-CM | POA: Diagnosis not present

## 2021-04-14 DIAGNOSIS — N186 End stage renal disease: Secondary | ICD-10-CM | POA: Diagnosis not present

## 2021-04-14 DIAGNOSIS — N2581 Secondary hyperparathyroidism of renal origin: Secondary | ICD-10-CM | POA: Diagnosis not present

## 2021-04-14 DIAGNOSIS — R197 Diarrhea, unspecified: Secondary | ICD-10-CM | POA: Diagnosis not present

## 2021-04-14 DIAGNOSIS — Z992 Dependence on renal dialysis: Secondary | ICD-10-CM | POA: Diagnosis not present

## 2021-04-17 ENCOUNTER — Other Ambulatory Visit: Payer: Self-pay | Admitting: Family Medicine

## 2021-04-18 NOTE — Telephone Encounter (Signed)
Last office visit 03/23/2021 MWV.  Last refilled 01/18/2021 for #23 with 1 refill.  Next Appt: 09/21/2021 for 6 month follow up.

## 2021-04-19 DIAGNOSIS — N186 End stage renal disease: Secondary | ICD-10-CM | POA: Diagnosis not present

## 2021-04-19 DIAGNOSIS — R197 Diarrhea, unspecified: Secondary | ICD-10-CM | POA: Diagnosis not present

## 2021-04-19 DIAGNOSIS — D688 Other specified coagulation defects: Secondary | ICD-10-CM | POA: Diagnosis not present

## 2021-04-19 DIAGNOSIS — N2581 Secondary hyperparathyroidism of renal origin: Secondary | ICD-10-CM | POA: Diagnosis not present

## 2021-04-19 DIAGNOSIS — E1129 Type 2 diabetes mellitus with other diabetic kidney complication: Secondary | ICD-10-CM | POA: Diagnosis not present

## 2021-04-19 DIAGNOSIS — T8249XA Other complication of vascular dialysis catheter, initial encounter: Secondary | ICD-10-CM | POA: Diagnosis not present

## 2021-04-19 DIAGNOSIS — Z992 Dependence on renal dialysis: Secondary | ICD-10-CM | POA: Diagnosis not present

## 2021-04-19 DIAGNOSIS — D689 Coagulation defect, unspecified: Secondary | ICD-10-CM | POA: Diagnosis not present

## 2021-04-19 DIAGNOSIS — D509 Iron deficiency anemia, unspecified: Secondary | ICD-10-CM | POA: Diagnosis not present

## 2021-04-24 DIAGNOSIS — Z992 Dependence on renal dialysis: Secondary | ICD-10-CM | POA: Diagnosis not present

## 2021-04-24 DIAGNOSIS — N2581 Secondary hyperparathyroidism of renal origin: Secondary | ICD-10-CM | POA: Diagnosis not present

## 2021-04-24 DIAGNOSIS — D509 Iron deficiency anemia, unspecified: Secondary | ICD-10-CM | POA: Diagnosis not present

## 2021-04-24 DIAGNOSIS — D688 Other specified coagulation defects: Secondary | ICD-10-CM | POA: Diagnosis not present

## 2021-04-24 DIAGNOSIS — N186 End stage renal disease: Secondary | ICD-10-CM | POA: Diagnosis not present

## 2021-04-24 DIAGNOSIS — E1129 Type 2 diabetes mellitus with other diabetic kidney complication: Secondary | ICD-10-CM | POA: Diagnosis not present

## 2021-04-24 DIAGNOSIS — D689 Coagulation defect, unspecified: Secondary | ICD-10-CM | POA: Diagnosis not present

## 2021-04-24 DIAGNOSIS — T8249XA Other complication of vascular dialysis catheter, initial encounter: Secondary | ICD-10-CM | POA: Diagnosis not present

## 2021-04-24 DIAGNOSIS — R197 Diarrhea, unspecified: Secondary | ICD-10-CM | POA: Diagnosis not present

## 2021-04-30 ENCOUNTER — Other Ambulatory Visit: Payer: Self-pay | Admitting: *Deleted

## 2021-04-30 NOTE — Patient Outreach (Signed)
[facility]) Care Management  04/30/2021  Cindy Burns 03-Apr-1946 [id number]   [facility] Unsuccessful outreach  Referral Date: 10/26/20  Referral Source: [facility] hospital liaison, Eliott Nine Referral Reason: complex care and disease management follow up calls and assess for further needs  Insurance: united [place] [employer] [place] (united healthcare dual complete (HMO SNP)     Last admissions 1/66/06-0/04/59 acute metabolic encephalopathy, hyperkalemia, ESRD, DM  09/05/20-09/06/20 left against medical advice acute confusion, acute encephalopathy/confusion with differential diagnosis including metabolic encephalopathy, less likely acute CVA 97/7/41-42/39/53 Acute metabolic encephalopathy, DM, HTN,   Outreach attempt to the home number  No answer. THN RN CM left HIPAA [facility] Portability and Accountability Act) compliant voicemail message along with CM's contact info.   Plan: [facility] RN CM scheduled this patient for another call attempt within 4-7 business days Unsuccessful outreach on 04/30/21   Briel Gallicchio L. [name], RN, BSN, [place] Coordinator Office number [phone] Mobile number [phone]  Main [facility]N number [phone] Fax number [phone]

## 2021-05-01 DIAGNOSIS — D688 Other specified coagulation defects: Secondary | ICD-10-CM | POA: Diagnosis not present

## 2021-05-01 DIAGNOSIS — D689 Coagulation defect, unspecified: Secondary | ICD-10-CM | POA: Diagnosis not present

## 2021-05-01 DIAGNOSIS — N2581 Secondary hyperparathyroidism of renal origin: Secondary | ICD-10-CM | POA: Diagnosis not present

## 2021-05-01 DIAGNOSIS — N186 End stage renal disease: Secondary | ICD-10-CM | POA: Diagnosis not present

## 2021-05-01 DIAGNOSIS — E1129 Type 2 diabetes mellitus with other diabetic kidney complication: Secondary | ICD-10-CM | POA: Diagnosis not present

## 2021-05-01 DIAGNOSIS — Z992 Dependence on renal dialysis: Secondary | ICD-10-CM | POA: Diagnosis not present

## 2021-05-01 DIAGNOSIS — T8249XA Other complication of vascular dialysis catheter, initial encounter: Secondary | ICD-10-CM | POA: Diagnosis not present

## 2021-05-01 DIAGNOSIS — R197 Diarrhea, unspecified: Secondary | ICD-10-CM | POA: Diagnosis not present

## 2021-05-01 DIAGNOSIS — D509 Iron deficiency anemia, unspecified: Secondary | ICD-10-CM | POA: Diagnosis not present

## 2021-05-06 DIAGNOSIS — N186 End stage renal disease: Secondary | ICD-10-CM | POA: Diagnosis not present

## 2021-05-06 DIAGNOSIS — Z992 Dependence on renal dialysis: Secondary | ICD-10-CM | POA: Diagnosis not present

## 2021-05-06 DIAGNOSIS — N179 Acute kidney failure, unspecified: Secondary | ICD-10-CM | POA: Diagnosis not present

## 2021-05-07 ENCOUNTER — Other Ambulatory Visit: Payer: Self-pay | Admitting: *Deleted

## 2021-05-07 NOTE — Patient Outreach (Signed)
[facility]) Care Management  05/07/2021  KOBY HARTFIELD 07/05/1946 [id number]   [facility] second Unsuccessful outreach   Referral Date: 10/26/20  Referral Source: [facility] hospital liaison, Eliott Nine Referral Reason: complex care and disease management follow up calls and assess for further needs  Insurance: united [place] [employer] [place] (united healthcare dual complete (HMO SNP)     Last admissions 1/95/97-4/71/85 acute metabolic encephalopathy, hyperkalemia, ESRD, DM  09/05/20-09/06/20 left against medical advice acute confusion, acute encephalopathy/confusion with differential diagnosis including metabolic encephalopathy, less likely acute CVA 50/1/58-68/25/74 Acute metabolic encephalopathy, DM, HTN,     Outreach attempt to the home number No answer. THN RN CM left HIPAA [facility] Portability and Accountability Act) compliant voicemail message along with CM's contact info.   Plan: [facility] RN CM scheduled this patient for another call attempt within 4-7 business days Unsuccessful outreach on 04/30/21, 05/07/21 Unsuccessful letter sent 05/07/21      Joelene Millin L. [name], RN, BSN, [place] Coordinator Office number [phone] Mobile number [phone] Main [facility]N number [phone] Fax number [phone]

## 2021-05-08 DIAGNOSIS — Z992 Dependence on renal dialysis: Secondary | ICD-10-CM | POA: Diagnosis not present

## 2021-05-08 DIAGNOSIS — N186 End stage renal disease: Secondary | ICD-10-CM | POA: Diagnosis not present

## 2021-05-08 DIAGNOSIS — D688 Other specified coagulation defects: Secondary | ICD-10-CM | POA: Diagnosis not present

## 2021-05-08 DIAGNOSIS — D689 Coagulation defect, unspecified: Secondary | ICD-10-CM | POA: Diagnosis not present

## 2021-05-08 DIAGNOSIS — T8249XA Other complication of vascular dialysis catheter, initial encounter: Secondary | ICD-10-CM | POA: Diagnosis not present

## 2021-05-08 DIAGNOSIS — N2581 Secondary hyperparathyroidism of renal origin: Secondary | ICD-10-CM | POA: Diagnosis not present

## 2021-05-08 DIAGNOSIS — R197 Diarrhea, unspecified: Secondary | ICD-10-CM | POA: Diagnosis not present

## 2021-05-10 DIAGNOSIS — D689 Coagulation defect, unspecified: Secondary | ICD-10-CM | POA: Diagnosis not present

## 2021-05-10 DIAGNOSIS — Z992 Dependence on renal dialysis: Secondary | ICD-10-CM | POA: Diagnosis not present

## 2021-05-10 DIAGNOSIS — N2581 Secondary hyperparathyroidism of renal origin: Secondary | ICD-10-CM | POA: Diagnosis not present

## 2021-05-10 DIAGNOSIS — N186 End stage renal disease: Secondary | ICD-10-CM | POA: Diagnosis not present

## 2021-05-10 DIAGNOSIS — D688 Other specified coagulation defects: Secondary | ICD-10-CM | POA: Diagnosis not present

## 2021-05-10 DIAGNOSIS — R197 Diarrhea, unspecified: Secondary | ICD-10-CM | POA: Diagnosis not present

## 2021-05-10 DIAGNOSIS — T8249XA Other complication of vascular dialysis catheter, initial encounter: Secondary | ICD-10-CM | POA: Diagnosis not present

## 2021-05-12 DIAGNOSIS — T8249XA Other complication of vascular dialysis catheter, initial encounter: Secondary | ICD-10-CM | POA: Diagnosis not present

## 2021-05-12 DIAGNOSIS — N2581 Secondary hyperparathyroidism of renal origin: Secondary | ICD-10-CM | POA: Diagnosis not present

## 2021-05-12 DIAGNOSIS — D689 Coagulation defect, unspecified: Secondary | ICD-10-CM | POA: Diagnosis not present

## 2021-05-12 DIAGNOSIS — R197 Diarrhea, unspecified: Secondary | ICD-10-CM | POA: Diagnosis not present

## 2021-05-12 DIAGNOSIS — D688 Other specified coagulation defects: Secondary | ICD-10-CM | POA: Diagnosis not present

## 2021-05-12 DIAGNOSIS — N186 End stage renal disease: Secondary | ICD-10-CM | POA: Diagnosis not present

## 2021-05-12 DIAGNOSIS — Z992 Dependence on renal dialysis: Secondary | ICD-10-CM | POA: Diagnosis not present

## 2021-05-15 DIAGNOSIS — N2581 Secondary hyperparathyroidism of renal origin: Secondary | ICD-10-CM | POA: Diagnosis not present

## 2021-05-15 DIAGNOSIS — R197 Diarrhea, unspecified: Secondary | ICD-10-CM | POA: Diagnosis not present

## 2021-05-15 DIAGNOSIS — D689 Coagulation defect, unspecified: Secondary | ICD-10-CM | POA: Diagnosis not present

## 2021-05-15 DIAGNOSIS — D688 Other specified coagulation defects: Secondary | ICD-10-CM | POA: Diagnosis not present

## 2021-05-15 DIAGNOSIS — Z992 Dependence on renal dialysis: Secondary | ICD-10-CM | POA: Diagnosis not present

## 2021-05-15 DIAGNOSIS — N186 End stage renal disease: Secondary | ICD-10-CM | POA: Diagnosis not present

## 2021-05-15 DIAGNOSIS — T8249XA Other complication of vascular dialysis catheter, initial encounter: Secondary | ICD-10-CM | POA: Diagnosis not present

## 2021-05-19 DIAGNOSIS — N2581 Secondary hyperparathyroidism of renal origin: Secondary | ICD-10-CM | POA: Diagnosis not present

## 2021-05-19 DIAGNOSIS — T8249XA Other complication of vascular dialysis catheter, initial encounter: Secondary | ICD-10-CM | POA: Diagnosis not present

## 2021-05-19 DIAGNOSIS — D689 Coagulation defect, unspecified: Secondary | ICD-10-CM | POA: Diagnosis not present

## 2021-05-19 DIAGNOSIS — D688 Other specified coagulation defects: Secondary | ICD-10-CM | POA: Diagnosis not present

## 2021-05-19 DIAGNOSIS — N186 End stage renal disease: Secondary | ICD-10-CM | POA: Diagnosis not present

## 2021-05-19 DIAGNOSIS — R197 Diarrhea, unspecified: Secondary | ICD-10-CM | POA: Diagnosis not present

## 2021-05-19 DIAGNOSIS — Z992 Dependence on renal dialysis: Secondary | ICD-10-CM | POA: Diagnosis not present

## 2021-05-26 DIAGNOSIS — T8249XA Other complication of vascular dialysis catheter, initial encounter: Secondary | ICD-10-CM | POA: Diagnosis not present

## 2021-05-26 DIAGNOSIS — D688 Other specified coagulation defects: Secondary | ICD-10-CM | POA: Diagnosis not present

## 2021-05-26 DIAGNOSIS — N2581 Secondary hyperparathyroidism of renal origin: Secondary | ICD-10-CM | POA: Diagnosis not present

## 2021-05-26 DIAGNOSIS — R197 Diarrhea, unspecified: Secondary | ICD-10-CM | POA: Diagnosis not present

## 2021-05-26 DIAGNOSIS — Z992 Dependence on renal dialysis: Secondary | ICD-10-CM | POA: Diagnosis not present

## 2021-05-26 DIAGNOSIS — N186 End stage renal disease: Secondary | ICD-10-CM | POA: Diagnosis not present

## 2021-05-26 DIAGNOSIS — D689 Coagulation defect, unspecified: Secondary | ICD-10-CM | POA: Diagnosis not present

## 2021-05-30 ENCOUNTER — Other Ambulatory Visit: Payer: Self-pay | Admitting: *Deleted

## 2021-05-30 ENCOUNTER — Other Ambulatory Visit: Payer: Self-pay

## 2021-05-30 NOTE — Patient Outreach (Signed)
[facility]) Care Management  05/30/2021  Cindy Burns 22-Oct-1945 [id number]  [facility] Telephone follow up for complex care patient   Cindy Burns was referred to [facility] on 10/26/20 by [facility] hospital liaison, Eliott Nine Referral Reason: complex care and disease management follow up calls and assess for further needs  Insurance: united [place] [employer]  (united healthcare dual complete (HMO SNP)   She was able to verify HIPAA identifiers  Assessment Chronic Kidney disease (CKD)  She reports Hemodialysis (HD) sessions are going well. She states she is able to attend at least 2 days a week with a barrier of over sleeping  She informs RN CM she has not noted a difference in medical symptoms related to missing a HD day RN CM discuss with her worsening CKD symptoms of elevated blood pressure (BP), fatigue, swelling of feet/legs and shortness of breath (sob)  RN CM discusses the congested coughing and audible wheezing during this outreach as symptoms to monitor and report to nephrologist to prevent admissions She voiced understanding   Today's interest New dentures received and tolerating well  Oxygen needed per pt.for shortness of breath (sob)  She asked questions about how she could obtain an [employer] oxygen tank. She reports she has spoke with an [employer] representative but did not get a clear understanding of the process. She reports having to be administered oxygen at HD in the last few weeks for desaturation and feeling better afterwards. She reports she has to wear a mask during HD and had a coughing episode  RN CM reviewed oxygen qualification criteria per medicare guidelines with oxygen pulse oximetry test for oxygen results below 88% She was encouraged to request assist with this brom her pcp or nephrology staff Discussed the differences in the process of using [employer] or a local durable medical equipment (DME) provider Also discussed there will be  differences in the products She voiced understanding  She agrees to this note being forward to medical providers for possible assistance  Port a cath replacement She asked questions about port a cath replacement. She is reporting she was approached about having the right chest sight changed to her arm. She is cautious about this change as she does not feel their are concerns with accessing her chest site and not preferring the appearance of viewed arm access.  When RN CM inquired what her medical providers shared with her, she reports the change would occur because "they are worried about infection"   Patient Active Problem List   Diagnosis Date Noted   Physical deconditioning 02/07/2021   Primary osteoarthritis of left knee 12/08/2020   Type 2 diabetes with nephropathy ([place]) 10/27/2020   Hyperkalemia 10/22/2020   Bilateral leg pain 08/25/2020   Folate deficiency 08/17/2020   Closed traumatic minimally displaced fracture of two ribs 08/15/2020   ESRD (end stage renal disease) ([place]) 04/30/2020   Hypocalcemia 01/19/2020   Encounter for central line placement    Hepatomegaly 07/30/2019   B12 deficiency 04/06/2019   Balance problem 03/18/2019   Ventral hernia 04/03/2017   Hypertension associated with diabetes ([place]) 02/28/2017   Chronic insomnia 10/26/2013   Diabetic autonomic neuropathy ([place]) 08/12/2013   PULMONARY NODULE 09/27/2009   ADENOCARCINOMA, BREAST 02/28/2009   Normocytic anemia 06/24/2008   Moderate COPD (chronic obstructive pulmonary disease) ([place]) 03/14/2008   Major depressive disorder, recurrent episode, moderate ([place]) 08/11/2007   Hyperlipidemia associated with type 2 diabetes mellitus ([place]) 04/30/2007   CIGARETTE SMOKER 04/22/2007  Plan Patient agrees to care plan and follow up within the next 30 business days  Goals Addressed               This Visit's Progress     Patient Stated     (THN)Follow My Treatment Plan-Chronic Kidney (pt-stated)   On track      Timeframe:  Long-Range Goal Priority:  Medium Start Date:                02/07/21             Expected End Date:        08/05/21              Follow Up Date 06/18/21 Barriers: Knowledge    - call the doctor or nurse to get help with side effects - keep follow-up appointments  -ask questions about site change and need of oxygen   Notes:  05/30/21 reports decrease in attendance to HD (2 days a week) wheezing and congested coughing, asked questions about HD access site changes and qualification for [employer] oxygen use  05/07/21 unsuccessful outreach 04/30/21 unsuccessful outreach 03/28/21 Doing well in HD, no worsening symptoms, has made appointments for dental will miss to days of HD this week related to a water well repair 02/28/21 HD going well, Continues to work with PT but not ambulating well and continues with pain 02/07/21 doing well at Hemodialysis, reports more convenient, shorter sessions, Denies side effects Keeping appointments Seen by pcp today 02/07/21         Joelene Millin L. [name], RN, BSN, [place] Coordinator Office number [phone] Main [facility] number [phone] Fax number [phone]

## 2021-05-31 ENCOUNTER — Other Ambulatory Visit: Payer: Self-pay | Admitting: Family Medicine

## 2021-05-31 DIAGNOSIS — N2581 Secondary hyperparathyroidism of renal origin: Secondary | ICD-10-CM | POA: Diagnosis not present

## 2021-05-31 DIAGNOSIS — R197 Diarrhea, unspecified: Secondary | ICD-10-CM | POA: Diagnosis not present

## 2021-05-31 DIAGNOSIS — D688 Other specified coagulation defects: Secondary | ICD-10-CM | POA: Diagnosis not present

## 2021-05-31 DIAGNOSIS — D689 Coagulation defect, unspecified: Secondary | ICD-10-CM | POA: Diagnosis not present

## 2021-05-31 DIAGNOSIS — Z992 Dependence on renal dialysis: Secondary | ICD-10-CM | POA: Diagnosis not present

## 2021-05-31 DIAGNOSIS — T8249XA Other complication of vascular dialysis catheter, initial encounter: Secondary | ICD-10-CM | POA: Diagnosis not present

## 2021-05-31 DIAGNOSIS — N186 End stage renal disease: Secondary | ICD-10-CM | POA: Diagnosis not present

## 2021-06-05 DIAGNOSIS — D688 Other specified coagulation defects: Secondary | ICD-10-CM | POA: Diagnosis not present

## 2021-06-05 DIAGNOSIS — D689 Coagulation defect, unspecified: Secondary | ICD-10-CM | POA: Diagnosis not present

## 2021-06-05 DIAGNOSIS — T8249XA Other complication of vascular dialysis catheter, initial encounter: Secondary | ICD-10-CM | POA: Diagnosis not present

## 2021-06-05 DIAGNOSIS — R197 Diarrhea, unspecified: Secondary | ICD-10-CM | POA: Diagnosis not present

## 2021-06-05 DIAGNOSIS — Z992 Dependence on renal dialysis: Secondary | ICD-10-CM | POA: Diagnosis not present

## 2021-06-05 DIAGNOSIS — N186 End stage renal disease: Secondary | ICD-10-CM | POA: Diagnosis not present

## 2021-06-05 DIAGNOSIS — N2581 Secondary hyperparathyroidism of renal origin: Secondary | ICD-10-CM | POA: Diagnosis not present

## 2021-06-06 DIAGNOSIS — Z992 Dependence on renal dialysis: Secondary | ICD-10-CM | POA: Diagnosis not present

## 2021-06-06 DIAGNOSIS — N186 End stage renal disease: Secondary | ICD-10-CM | POA: Diagnosis not present

## 2021-06-06 DIAGNOSIS — N179 Acute kidney failure, unspecified: Secondary | ICD-10-CM | POA: Diagnosis not present

## 2021-06-11 ENCOUNTER — Emergency Department (HOSPITAL_COMMUNITY): Payer: Medicare Other

## 2021-06-11 ENCOUNTER — Other Ambulatory Visit: Payer: Self-pay

## 2021-06-11 ENCOUNTER — Emergency Department (HOSPITAL_COMMUNITY)
Admission: EM | Admit: 2021-06-11 | Discharge: 2021-06-11 | Disposition: A | Discharge status: Home / Self Care | Payer: Medicare Other | Attending: Emergency Medicine | Admitting: Emergency Medicine

## 2021-06-11 DIAGNOSIS — N186 End stage renal disease: Secondary | ICD-10-CM | POA: Insufficient documentation

## 2021-06-11 DIAGNOSIS — N898 Other specified noninflammatory disorders of vagina: Secondary | ICD-10-CM | POA: Insufficient documentation

## 2021-06-11 DIAGNOSIS — E1121 Type 2 diabetes mellitus with diabetic nephropathy: Secondary | ICD-10-CM | POA: Diagnosis not present

## 2021-06-11 DIAGNOSIS — E1143 Type 2 diabetes mellitus with diabetic autonomic (poly)neuropathy: Secondary | ICD-10-CM | POA: Insufficient documentation

## 2021-06-11 DIAGNOSIS — Z743 Need for continuous supervision: Secondary | ICD-10-CM | POA: Diagnosis not present

## 2021-06-11 DIAGNOSIS — R0602 Shortness of breath: Secondary | ICD-10-CM | POA: Insufficient documentation

## 2021-06-11 DIAGNOSIS — E1140 Type 2 diabetes mellitus with diabetic neuropathy, unspecified: Secondary | ICD-10-CM | POA: Insufficient documentation

## 2021-06-11 DIAGNOSIS — M542 Cervicalgia: Secondary | ICD-10-CM | POA: Diagnosis not present

## 2021-06-11 DIAGNOSIS — M7989 Other specified soft tissue disorders: Secondary | ICD-10-CM | POA: Diagnosis not present

## 2021-06-11 DIAGNOSIS — R062 Wheezing: Secondary | ICD-10-CM | POA: Diagnosis not present

## 2021-06-11 DIAGNOSIS — J449 Chronic obstructive pulmonary disease, unspecified: Secondary | ICD-10-CM | POA: Insufficient documentation

## 2021-06-11 DIAGNOSIS — Z87891 Personal history of nicotine dependence: Secondary | ICD-10-CM | POA: Insufficient documentation

## 2021-06-11 DIAGNOSIS — I120 Hypertensive chronic kidney disease with stage 5 chronic kidney disease or end stage renal disease: Secondary | ICD-10-CM | POA: Diagnosis not present

## 2021-06-11 DIAGNOSIS — R0689 Other abnormalities of breathing: Secondary | ICD-10-CM | POA: Diagnosis not present

## 2021-06-11 DIAGNOSIS — Z79899 Other long term (current) drug therapy: Secondary | ICD-10-CM | POA: Diagnosis not present

## 2021-06-11 DIAGNOSIS — Z20822 Contact with and (suspected) exposure to covid-19: Secondary | ICD-10-CM | POA: Diagnosis not present

## 2021-06-11 DIAGNOSIS — R6889 Other general symptoms and signs: Secondary | ICD-10-CM | POA: Diagnosis not present

## 2021-06-11 DIAGNOSIS — Z853 Personal history of malignant neoplasm of breast: Secondary | ICD-10-CM | POA: Diagnosis not present

## 2021-06-11 DIAGNOSIS — Z992 Dependence on renal dialysis: Secondary | ICD-10-CM | POA: Insufficient documentation

## 2021-06-11 DIAGNOSIS — M7918 Myalgia, other site: Secondary | ICD-10-CM

## 2021-06-11 LAB — CBC WITH DIFFERENTIAL/PLATELET
Abs Immature Granulocytes: 0.06 10*3/uL (ref 0.00–0.07)
Basophils Absolute: 0 10*3/uL (ref 0.0–0.1)
Basophils Relative: 1 %
Eosinophils Absolute: 0.4 10*3/uL (ref 0.0–0.5)
Eosinophils Relative: 5 %
HCT: 31.1 % — ABNORMAL LOW (ref 36.0–46.0)
Hemoglobin: 9.9 g/dL — ABNORMAL LOW (ref 12.0–15.0)
Immature Granulocytes: 1 %
Lymphocytes Relative: 16 %
Lymphs Abs: 1.2 10*3/uL (ref 0.7–4.0)
MCH: 34 pg (ref 26.0–34.0)
MCHC: 31.8 g/dL (ref 30.0–36.0)
MCV: 106.9 fL — ABNORMAL HIGH (ref 80.0–100.0)
Monocytes Absolute: 0.3 10*3/uL (ref 0.1–1.0)
Monocytes Relative: 4 %
Neutro Abs: 5.9 10*3/uL (ref 1.7–7.7)
Neutrophils Relative %: 73 %
Platelets: 149 10*3/uL — ABNORMAL LOW (ref 150–400)
RBC: 2.91 MIL/uL — ABNORMAL LOW (ref 3.87–5.11)
RDW: 13.2 % (ref 11.5–15.5)
WBC: 7.9 10*3/uL (ref 4.0–10.5)
nRBC: 0 % (ref 0.0–0.2)

## 2021-06-11 LAB — RESP PANEL BY RT-PCR (FLU A&B, COVID) ARPGX2
Influenza A by PCR: NEGATIVE
Influenza B by PCR: NEGATIVE
SARS Coronavirus 2 by RT PCR: NEGATIVE

## 2021-06-11 LAB — BASIC METABOLIC PANEL
Anion gap: 16 — ABNORMAL HIGH (ref 5–15)
BUN: 109 mg/dL — ABNORMAL HIGH (ref 8–23)
CO2: 16 mmol/L — ABNORMAL LOW (ref 22–32)
Calcium: 7.3 mg/dL — ABNORMAL LOW (ref 8.9–10.3)
Chloride: 103 mmol/L (ref 98–111)
Creatinine, Ser: 8.66 mg/dL — ABNORMAL HIGH (ref 0.44–1.00)
GFR, Estimated: 4 mL/min — ABNORMAL LOW (ref >60)
Glucose, Bld: 161 mg/dL — ABNORMAL HIGH (ref 70–99)
Potassium: 6.1 mmol/L — ABNORMAL HIGH (ref 3.5–5.1)
Sodium: 135 mmol/L (ref 135–145)

## 2021-06-11 LAB — I-STAT CHEM 8, ED
BUN: >130 mg/dL — ABNORMAL HIGH (ref 8–23)
Calcium, Ion: 0.88 mmol/L — CL (ref 1.15–1.40)
Chloride: 107 mmol/L (ref 98–111)
Creatinine, Ser: 9.8 mg/dL — ABNORMAL HIGH (ref 0.44–1.00)
Glucose, Bld: 157 mg/dL — ABNORMAL HIGH (ref 70–99)
HCT: 28 % — ABNORMAL LOW (ref 36.0–46.0)
Hemoglobin: 9.5 g/dL — ABNORMAL LOW (ref 12.0–15.0)
Potassium: 5.9 mmol/L — ABNORMAL HIGH (ref 3.5–5.1)
Sodium: 135 mmol/L (ref 135–145)
TCO2: 17 mmol/L — ABNORMAL LOW (ref 22–32)

## 2021-06-11 MED ORDER — LIDOCAINE 5 % EX PTCH
1.0000 | MEDICATED_PATCH | CUTANEOUS | Status: DC
  Administered 2021-06-11: 1 via TRANSDERMAL
  Filled 2021-06-11: qty 1

## 2021-06-11 MED ORDER — SODIUM ZIRCONIUM CYCLOSILICATE 10 G PO PACK
10.0000 g | PACK | Freq: Every day | ORAL | Status: DC
  Administered 2021-06-11: 10 g via ORAL
  Filled 2021-06-11: qty 1

## 2021-06-11 MED ORDER — ACETAMINOPHEN 325 MG PO TABS
650.0000 mg | ORAL_TABLET | Freq: Once | ORAL | Status: AC
  Administered 2021-06-11: 650 mg via ORAL
  Filled 2021-06-11: qty 2

## 2021-06-11 MED ORDER — LIDOCAINE 5 % EX PTCH: 1.0000 | MEDICATED_PATCH | CUTANEOUS | 0 refills | Status: AC

## 2021-06-11 MED ORDER — ALBUTEROL SULFATE (2.5 MG/3ML) 0.083% IN NEBU
5.0000 mg | INHALATION_SOLUTION | Freq: Once | RESPIRATORY_TRACT | Status: AC
  Administered 2021-06-11: 5 mg via RESPIRATORY_TRACT
  Filled 2021-06-11: qty 6

## 2021-06-11 MED ORDER — IPRATROPIUM BROMIDE 0.02 % IN SOLN
0.5000 mg | Freq: Once | RESPIRATORY_TRACT | Status: AC
  Administered 2021-06-11: 0.5 mg via RESPIRATORY_TRACT
  Filled 2021-06-11: qty 2.5

## 2021-06-11 NOTE — ED Triage Notes (Signed)
PT BIB [facility] from home after pt c/o of 10/10  neck pain while laying down. Pt concern that the pain is caused by Dialysis port on right side. PT has missed several dialysis apts. Last was Tues 8/30. Left arm restricted.   Hx HTN, COPD, ESRD,

## 2021-06-11 NOTE — ED Notes (Signed)
Patient verbalizes understanding of discharge instructions. Opportunity for questioning and answers were provided. Armband removed by staff, pt discharged from ED via wheelchair to lobby to wait for taxi.

## 2021-06-11 NOTE — ED Provider Notes (Signed)
[place] EMERGENCY DEPARTMENT Provider Note   CSN: [id number] Arrival date & time: 06/11/21  6967     History Chief Complaint  Patient presents with   Neck Pain    Right side    Cindy Burns is a 75 y.o. female with a history of COPD, tobacco use disorder, diabetes mellitus type 2, ESRD on HD (T/R/S) who presents the emergency department by EMS with a chief complaint of neck pain.  The patient reports intermittent, sharp right-sided neck pain for the last 3 days.  Pain is brought on by sitting up or laying back.  After the pain comes on it will last about an hour and then will resolve spontaneously.  Pain is not worse with other movements of the neck.  No recent falls or injuries. She does have a dialysis catheter in her right upper chest.  She denies pain around the catheter site, but does report that during her last dialysis session on Tuesday that the dialysis nurse had some blood " squirt out of the catheter".  She denies any other recent concerns for her dialysis catheter.  She does report that she is feeling more short of breath and has been wheezing.  She has been using her home albuterol nebulizer machine without improvement.  She also reports associated swelling in her legs.  She states that she missed her last 2 dialysis sessions due to a flat tire, but reports that the problem has been fixed and her transportation is intact.  No recent falls or injuries.  No fever or chills.  No neck stiffness, chest pain, rash, abdominal pain, nausea, vomiting, diarrhea, dizziness, lightheadedness, jaw pain, dental pain, URI symptoms.  No treatment prior to arrival.  She is a current, everyday 1 pack/day smoker.  She continues to make urine.   The history is provided by the patient and medical records. No language interpreter was used.      Past Medical History:  Diagnosis Date   AKI (acute kidney injury) ([place]) 01/2020   Cancer ([place]) 2005   Breast Cancer   COPD  (chronic obstructive pulmonary disease) ([place])    Depression    Diabetes mellitus without complication ([place])    Hyperlipidemia     Patient Active Problem List   Diagnosis Date Noted   Physical deconditioning 02/07/2021   Primary osteoarthritis of left knee 12/08/2020   Type 2 diabetes with nephropathy ([place]) 10/27/2020   Hyperkalemia 10/22/2020   Bilateral leg pain 08/25/2020   Folate deficiency 08/17/2020   Closed traumatic minimally displaced fracture of two ribs 08/15/2020   ESRD (end stage renal disease) ([place]) 04/30/2020   Hypocalcemia 01/19/2020   Encounter for central line placement    Hepatomegaly 07/30/2019   B12 deficiency 04/06/2019   Balance problem 03/18/2019   Ventral hernia 04/03/2017   Hypertension associated with diabetes ([place]) 02/28/2017   Chronic insomnia 10/26/2013   Diabetic autonomic neuropathy ([place]) 08/12/2013   PULMONARY NODULE 09/27/2009   ADENOCARCINOMA, BREAST 02/28/2009   Normocytic anemia 06/24/2008   Moderate COPD (chronic obstructive pulmonary disease) ([place]) 03/14/2008   Major depressive disorder, recurrent episode, moderate ([place]) 08/11/2007   Hyperlipidemia associated with type 2 diabetes mellitus ([place]) 04/30/2007   CIGARETTE SMOKER 04/22/2007    Past Surgical History:  Procedure Laterality Date   BREAST SURGERY  2005   CATARACT EXTRACTION     CHOLECYSTECTOMY  2013   IR FLUORO GUIDE CV LINE RIGHT  02/01/2020   IR US GUIDE VASC ACCESS RIGHT  02/01/2020  OB History   No obstetric history on file.     Family History  Problem Relation Age of Onset   Arthritis Mother    Alzheimer's disease Mother    Dementia Father    COPD Brother    COPD Brother     Social History   Tobacco Use   Smoking status: Former    Packs/day: 1.00    Years: 40.00    Pack years: 40.00    Types: Cigarettes   Smokeless tobacco: Never  Vaping Use   Vaping Use: Never used  Substance Use Topics   Alcohol use: No   Drug use: No    Home  Medications Prior to Admission medications   Medication Sig Start Date End Date Taking? Authorizing Provider  albuterol (VENTOLIN HFA) 108 (90 Base) MCG/ACT inhaler TAKE 2 PUFFS BY MOUTH EVERY 6 HOURS AS NEEDED FOR WHEEZE OR SHORTNESS OF BREATH Patient taking differently: Inhale 2 puffs into the lungs every 6 (six) hours as needed for shortness of breath or wheezing. 05/31/21  Yes Bedsole, Amy E, MD  amLODipine (NORVASC) 10 MG tablet Take 1 tablet (10 mg total) by mouth daily. 02/20/20 04/29/29 Yes Alma Friendly, MD  diclofenac Sodium (VOLTAREN) 1 % GEL Apply 2-4 g topically 4 (four) times daily as needed (to painful sites).  08/22/20  Yes [provider]  folic acid (FOLVITE) 1 MG tablet Take 1 tablet (1 mg total) by mouth daily. 08/17/20 08/17/21 Yes Debbe Odea, MD  gabapentin (NEURONTIN) 300 MG capsule TAKE 1 CAPSULE BY MOUTH AFTER DIALYSIS ON DIALYSIS ON DAYS Patient taking differently: Take 300 mg by mouth See admin instructions. After dialysis Tuesday,Thursday,saturday 01/18/21  Yes Bedsole, Amy E, MD  iron sucrose in sodium chloride 0.9 % 100 mL Inject into the vein. 10/19/20 10/18/21 Yes [provider]  JANUVIA 25 MG tablet TAKE 1 TABLET BY MOUTH EVERY DAY Patient taking differently: Take 25 mg by mouth daily. 03/29/21  Yes [name], MD  Methoxy PEG-Epoetin Beta (MIRCERA IJ) Mircera 11/07/20 11/06/21 Yes [provider]  metoprolol tartrate (LOPRESSOR) 25 MG tablet Take 1 tablet (25 mg total) by mouth 2 (two) times daily. 02/05/21 06/11/21 Yes Bedsole, Amy E, MD  rosuvastatin (CRESTOR) 20 MG tablet TAKE 1 TABLET BY MOUTH EVERY DAY Patient taking differently: Take 20 mg by mouth daily. 07/27/20  Yes Bedsole, Amy E, MD  sevelamer carbonate (RENVELA) 800 MG tablet Take 1,600 mg by mouth See admin instructions. Take 1,600 mg by mouth with meals (when eating)   Yes [provider]  traMADol (ULTRAM) 50 MG tablet TAKE 1/2 TABLET BY MOUTH TWICE DAILY AS  NEEDED Patient taking differently: Take 25 mg by mouth every 12 (twelve) hours as needed for moderate pain. 04/18/21  Yes Bedsole, Amy E, MD  venlafaxine XR (EFFEXOR-XR) 37.5 MG 24 hr capsule TAKE 1 CAPSULE (37.5 MG TOTAL) BY MOUTH AT BEDTIME. 04/18/21  Yes Bedsole, Amy E, MD  ipratropium-albuterol (DUONEB) 0.5-2.5 (3) MG/3ML SOLN Take 3 mLs by nebulization every 6 (six) hours as needed. Patient not taking: Reported on 06/11/2021 02/19/20 04/29/29  Alma Friendly, MD  sodium bicarbonate 650 MG tablet Take 2 tablets (1,300 mg total) by mouth 3 (three) times daily. Patient not taking: No sig reported 10/25/20   Debbe Odea, MD    Allergies    Metoprolol  Review of Systems   Review of Systems  Constitutional:  Negative for activity change, chills, diaphoresis and fever.  HENT:  Negative for congestion,  sinus pressure, sneezing and sore throat.   Respiratory:  Positive for shortness of breath. Negative for cough, choking and wheezing.   Cardiovascular:  Positive for leg swelling. Negative for chest pain and palpitations.  Gastrointestinal:  Negative for abdominal pain, diarrhea, nausea and vomiting.  Genitourinary:  Negative for dysuria.  Musculoskeletal:  Positive for myalgias and neck pain. Negative for arthralgias, back pain, gait problem and neck stiffness.  Skin:  Negative for rash.  Allergic/Immunologic: Negative for immunocompromised state.  Neurological:  Negative for dizziness, seizures, syncope, weakness, numbness and headaches.  Psychiatric/Behavioral:  Negative for confusion.    Physical Exam Updated Vital Signs BP (!) 196/83   Pulse 80   Temp 97.8 F (36.6 C) (Oral)   Resp (!) 22   Ht 5\' 2"  (1.575 m)   Wt 49.9 kg   SpO2 100%   BMI 20.12 kg/m   Physical Exam Vitals and nursing note reviewed.  Constitutional:      General: She is not in acute distress.    Appearance: She is not ill-appearing, toxic-appearing or diaphoretic.  HENT:     Head: Normocephalic.  Eyes:      Conjunctiva/sclera: Conjunctivae normal.  Neck:     Comments: Tender to palpation over the right lateral neck musculature.  No swelling.  No tenderness palpation to the cervical spine.  Full active and passive range of motion.  Carotid pulses are 2+ and symmetric.  No lymphadenopathy. Cardiovascular:     Rate and Rhythm: Normal rate and regular rhythm.     Heart sounds: No murmur heard.   No friction rub. No gallop.  Pulmonary:     Effort: Pulmonary effort is normal. No respiratory distress.     Breath sounds: No stridor. Wheezing present. No rhonchi or rales.     Comments: Dialysis catheter in place to the right anterior chest wall.  No surrounding erythema, warmth, crepitus, bleeding, or purulent drainage.  The suture holding the catheter in place is not intact.  The catheter appears to be about 1 to 2 cm removed out of place of the skin.   Diffuse wheezes and rhonchi bilaterally.  No increased work of breathing.  Patient is able to speak in complete, fluent sentences. Chest:     Chest wall: No tenderness.  Abdominal:     General: There is no distension.     Palpations: Abdomen is soft. There is no mass.     Tenderness: There is no abdominal tenderness. There is no right CVA tenderness, left CVA tenderness, guarding or rebound.     Hernia: No hernia is present.  Musculoskeletal:        General: No tenderness.     Cervical back: Neck supple. No rigidity.     Right lower leg: No edema.     Left lower leg: No edema.     Comments: No significant peripheral edema.  Lymphadenopathy:     Cervical: No cervical adenopathy.  Skin:    General: Skin is warm.     Capillary Refill: Capillary refill takes less than 2 seconds.     Findings: No rash.  Neurological:     General: No focal deficit present.     Mental Status: She is alert.  Psychiatric:        Behavior: Behavior normal.    ED Results / Procedures / Treatments   Labs (all labs ordered are listed, but only abnormal results  are displayed) Labs Reviewed  CBC WITH DIFFERENTIAL/PLATELET - Abnormal; Notable for the following  components:      Result Value   RBC 2.91 (*)    Hemoglobin 9.9 (*)    HCT 31.1 (*)    MCV 106.9 (*)    Platelets 149 (*)    All other components within normal limits  I-STAT CHEM 8, ED - Abnormal; Notable for the following components:   Potassium 5.9 (*)    BUN >130 (*)    Creatinine, Ser 9.80 (*)    Glucose, Bld 157 (*)    Calcium, Ion 0.88 (*)    TCO2 17 (*)    Hemoglobin 9.5 (*)    HCT 28.0 (*)    All other components within normal limits  RESP PANEL BY RT-PCR (FLU A&B, COVID) ARPGX2  BASIC METABOLIC PANEL    EKG EKG Interpretation  Date/Time:  Monday June 11 2021 05:28:58 EDT Ventricular Rate:  86 PR Interval:  170 QRS Duration: 100 QT Interval:  408 QTC Calculation: 488 R Axis:   35 Text Interpretation: Sinus rhythm Borderline low voltage, extremity leads Consider inferior infarct No significant change since last tracing Confirmed by Blanchie Dessert [phone]) on 06/11/2021 5:38:00 AM  Radiology DG Chest Portable 1 View  Result Date: 06/11/2021 CLINICAL DATA:  Shortness of breath.  Missed dialysis EXAM: PORTABLE CHEST 1 VIEW COMPARISON:  10/22/2020 FINDINGS: Stable heart size and mediastinal contours. No Kerley lines, effusion, or air leak. Density at the bases is primarily attributed to cartilage calcification. There is also some degree of scarring on the left by prior CT. Left axillary dissection. Unremarkable right dialysis catheter. IMPRESSION: No pulmonary edema or pleural fluid. Electronically Signed   By: Monte Fantasia M.D.   On: 06/11/2021 05:57    Procedures Procedures   Medications Ordered in ED Medications  albuterol (PROVENTIL) (2.5 MG/3ML) 0.083% nebulizer solution 5 mg (5 mg Nebulization Given 06/11/21 0628)  ipratropium (ATROVENT) nebulizer solution 0.5 mg (0.5 mg Nebulization Given 06/11/21 0628)  acetaminophen (TYLENOL) tablet 650 mg (650 mg Oral  Given 06/11/21 0174)    ED Course  I have reviewed the triage vital signs and the nursing notes.  Pertinent labs & imaging results that were available during my care of the patient were reviewed by me and considered in my medical decision making (see chart for details).    MDM Rules/Calculators/A&P                           75 year old female with a history of COPD, tobacco use disorder, diabetes mellitus type 2, ESRD on HD (T/R/S) who presents emergency department EMS with a chief complaint of right-sided neck pain.  She is having intermittent pain to the right side of her neck for the last 3 days that comes on with laying flat or sitting up and last for about an hour before resolving.  Unfortunately, she missed her last 2 dialysis sessions due to transportation issues, but reports the transportation issues have now been resolved.  She is feeling more short of breath, but denies chest pain.  Reports that she is having worsening swelling in her legs.  No other associated symptoms.  She is hypertensive with mild tachypnea, but vital signs are otherwise unremarkable.  Oxygen saturation is 100% on room air.  On exam, dialysis catheter sutures is not intact, but is only been pulled out about a centimeter.  She has rhonchi or wheezing on exam.  EKG unchanged from previous with no changes to T waves or QRS complexes.  The patient  was seen and independently evaluated by Dr. Nicholes Stairs, attending physician.  Labs and imaging been reviewed and independently interpreted by me.  Potassium on i-STAT is 5.9.  Creatinine is elevated to 9.8.  Chest x-ray without pulmonary edema or pleural fluid.  Unremarkable right dialysis catheter.  Labs are otherwise pending.  We will need to touch base with nephrology regarding dialysis catheter access since the suture is not intact.  Patient care transferred to [place] at the end of my shift. Patient presentation, ED course, and plan of care discussed with review of  all pertinent labs and imaging. Please see her note for further details regarding further ED course and disposition.  Final Clinical Impression(s) / ED Diagnoses Final diagnoses:  None    Rx / DC Orders ED Discharge Orders     None        Sheela Mcculley A, PA-C 06/11/21 3016    Blanchie Dessert, MD 06/14/21 1152

## 2021-06-11 NOTE — ED Provider Notes (Signed)
  Physical Exam  BP (!) 196/83   Pulse 80   Temp 97.8 F (36.6 C) (Oral)   Resp (!) 22   Ht 5\' 2"  (1.575 m)   Wt 49.9 kg   SpO2 100%   BMI 20.12 kg/m   Physical Exam  ED Course/Procedures   Clinical Course as of 06/11/21 [place] Jun 11, 2021  0918 Consult to Dr. Melvia Heaps, nephro, who will attempt to identify who placed the dialysis catheter so that treatment plan can be discussed with that provider.  He states he will call me back.  Appreciate his collaboration in the care of this patient. [RS]  1021 Consult callback received from [place] Dr. Melvia Heaps, who states catheter has been in place for over a year and does not require securing by suture again at this time. Patient is stable for discharge from a nephrology standpoint. May present for dialysis tomorrow. I appreciate his collaboration in the care of this patient.  [RS]    Clinical Course User Index [RS] Gad Aymond, Gypsy Balsam, PA-C    Procedures  MDM    Care of this patient assumed from preceding ED provider Mia McDonald, PA-C at time of shift change.  Please see her associated note for further insight into the patient's ED course.  In brief, patient is a 75 year old female dialysis dependent T/TH/Sat who presents with concern for neck pain, which per preceding ED provider appears to be musculoskeletal.  Unfortunately, patient has missed dialysis x 2 this week, secondary to flat tire on her vehicle.  Last dialysis session was 6 days ago today she was found to be hyperkalemic by ISTAT. At time of shift change, patient is awaiting laboratory studies.  Additionally patient's dialysis catheter suture has been broken and catheter appears to have been withdrawn approximately 1 to 2 cm, though on chest x-ray remains within vasculature.  Pending laboratory studies, will consult nephrology.   BMP with hyperkalemia of 6.1CBC with anemia near patient's baseline with hemoglobin of 9.9.  And creatinine near baseline of 8.6.  Very mild  anion gap elevation to 16.  Patient administered [facility], nephrology consulted as above.  No further work-up warranted in ED at this time.  Per nephrology, patient is stable to be dialyzed tomorrow in the outpatient setting and as her catheter has been in place for over a year does not require resecuring with suture at this time.  I appreciate the collaboration of care of this patient.  Avanelle voiced understanding of her medical evaluation and treatment plan.  Each of her questions answered to her expressed satisfaction.  Return precautions are given.  Patient is stable and appropriate for discharge at this time.  This chart was dictated using voice recognition software, Dragon. Despite the best efforts of this provider to proofread and correct errors, errors may still occur which can change documentation meaning.       Emeline Darling, PA-C 06/11/21 1031    Arnaldo Natal, MD 06/12/21 [phone]

## 2021-06-11 NOTE — ED Notes (Signed)
MD Maryan Rued was in different pt room at time of result, this tech sent result via chat then personally confirmed moments later that she had received it and she confirmed she had

## 2021-06-11 NOTE — Discharge Instructions (Addendum)
You were seen in the ER today for your neck pain.  This appears to be muscular in nature.  You may use topical pain relief such as Biofreeze, IcyHot, or lidocaine patches.  Additionally you may use Tylenol as needed for your discomfort.  In regards to your blood work, you were found to have high potassium today.  You administered medication to attempt to prevent complications from high potassium.  It is extremely important you present for your dialysis session tomorrow; the nephrologist evaluated you today and does not feel emergent dialysis is necessary.  Please present for dialysis tomorrow as scheduled.   Return to ER with  numbness, tingling, weakness in the hands, chest pain, palpitations, or any other new severe symptoms.

## 2021-06-12 DIAGNOSIS — Z992 Dependence on renal dialysis: Secondary | ICD-10-CM | POA: Diagnosis not present

## 2021-06-12 DIAGNOSIS — D688 Other specified coagulation defects: Secondary | ICD-10-CM | POA: Diagnosis not present

## 2021-06-12 DIAGNOSIS — T8249XA Other complication of vascular dialysis catheter, initial encounter: Secondary | ICD-10-CM | POA: Diagnosis not present

## 2021-06-12 DIAGNOSIS — N186 End stage renal disease: Secondary | ICD-10-CM | POA: Diagnosis not present

## 2021-06-12 DIAGNOSIS — D689 Coagulation defect, unspecified: Secondary | ICD-10-CM | POA: Diagnosis not present

## 2021-06-12 DIAGNOSIS — N2581 Secondary hyperparathyroidism of renal origin: Secondary | ICD-10-CM | POA: Diagnosis not present

## 2021-06-18 ENCOUNTER — Other Ambulatory Visit: Payer: Self-pay | Admitting: *Deleted

## 2021-06-18 NOTE — Patient Outreach (Signed)
[place] [facility]) Care Management  06/18/2021  JONIE BURDELL 11-16-1945 [id number]  [facility] Unsuccessful outreach for complex care patient    ANADALAY MACDONELL was referred to [facility] on 10/26/20 by [facility] hospital liaison, Eliott Nine Referral Reason: complex care and disease management follow up calls and assess for further needs  Insurance: united [place] [employer] [place] (united healthcare dual complete (HMO SNP)     Outreach attempt to the home number  No answer. [facility] RN CM left HIPAA [facility] Portability and Accountability Act) compliant voicemail message along with CM's contact info.   Plan: [facility] RN CM scheduled this patient for another call attempt within 4-7 business days Unsuccessful outreach letter sent on  Unsuccessful outreach on    [place]. [name], RN, BSN, [place] Coordinator Office number [phone] Mobile number [phone]  Main [facility] number [phone] Fax number [phone]

## 2021-06-22 ENCOUNTER — Other Ambulatory Visit: Payer: Self-pay | Admitting: *Deleted

## 2021-06-22 NOTE — Patient Outreach (Signed)
[place] [facility]) Care Management  06/22/2021  Cindy Burns [birth year] [id number]   [facility] second Unsuccessful outreach for [facility] engaged complex care patient     Cindy Burns was referred to [facility] on 10/26/20 by [facility] hospital liaison, Eliott Nine Referral Reason: complex care and disease management follow up calls and assess for further needs  Insurance: united [place] [employer] [place] (united healthcare dual complete (HMO SNP)     Last successful outreach on 05/30/21 Outreach attempt to the home number [phone] No answer. [facility] RN CM left HIPAA [facility] Portability and Accountability Act) compliant voicemail message along with CM's contact info.    Plan: [facility] RN CM scheduled this patient for another call attempt within 4-7 business days Unsuccessful outreach letter sent on 06/22/21 Unsuccessful outreach on 06/18/21, 06/22/21     Joelene Millin L. [name], RN, BSN, [place] Coordinator Office number [phone] Mobile number [phone]  Main [facility] number [phone] Fax number [phone]

## 2021-06-26 ENCOUNTER — Encounter (HOSPITAL_COMMUNITY): Payer: Self-pay

## 2021-06-26 ENCOUNTER — Emergency Department (HOSPITAL_COMMUNITY): Payer: Medicare Other

## 2021-06-26 ENCOUNTER — Other Ambulatory Visit: Payer: Self-pay

## 2021-06-26 ENCOUNTER — Emergency Department (HOSPITAL_COMMUNITY)
Admission: EM | Admit: 2021-06-26 | Discharge: 2021-06-26 | Disposition: A | Discharge status: Home / Self Care | Payer: Medicare Other | Attending: Emergency Medicine | Admitting: Emergency Medicine

## 2021-06-26 DIAGNOSIS — Z853 Personal history of malignant neoplasm of breast: Secondary | ICD-10-CM | POA: Diagnosis not present

## 2021-06-26 DIAGNOSIS — E1169 Type 2 diabetes mellitus with other specified complication: Secondary | ICD-10-CM | POA: Insufficient documentation

## 2021-06-26 DIAGNOSIS — Z992 Dependence on renal dialysis: Secondary | ICD-10-CM | POA: Insufficient documentation

## 2021-06-26 DIAGNOSIS — N186 End stage renal disease: Secondary | ICD-10-CM | POA: Diagnosis not present

## 2021-06-26 DIAGNOSIS — D688 Other specified coagulation defects: Secondary | ICD-10-CM | POA: Diagnosis not present

## 2021-06-26 DIAGNOSIS — I120 Hypertensive chronic kidney disease with stage 5 chronic kidney disease or end stage renal disease: Secondary | ICD-10-CM | POA: Diagnosis not present

## 2021-06-26 DIAGNOSIS — Z79899 Other long term (current) drug therapy: Secondary | ICD-10-CM | POA: Diagnosis not present

## 2021-06-26 DIAGNOSIS — Z20822 Contact with and (suspected) exposure to covid-19: Secondary | ICD-10-CM | POA: Diagnosis not present

## 2021-06-26 DIAGNOSIS — R4020 Unspecified coma: Secondary | ICD-10-CM

## 2021-06-26 DIAGNOSIS — E1122 Type 2 diabetes mellitus with diabetic chronic kidney disease: Secondary | ICD-10-CM | POA: Diagnosis not present

## 2021-06-26 DIAGNOSIS — Z743 Need for continuous supervision: Secondary | ICD-10-CM | POA: Diagnosis not present

## 2021-06-26 DIAGNOSIS — S069X9A Unspecified intracranial injury with loss of consciousness of unspecified duration, initial encounter: Secondary | ICD-10-CM | POA: Diagnosis not present

## 2021-06-26 DIAGNOSIS — E785 Hyperlipidemia, unspecified: Secondary | ICD-10-CM | POA: Insufficient documentation

## 2021-06-26 DIAGNOSIS — R55 Syncope and collapse: Secondary | ICD-10-CM | POA: Insufficient documentation

## 2021-06-26 DIAGNOSIS — T8249XA Other complication of vascular dialysis catheter, initial encounter: Secondary | ICD-10-CM | POA: Diagnosis not present

## 2021-06-26 DIAGNOSIS — N2581 Secondary hyperparathyroidism of renal origin: Secondary | ICD-10-CM | POA: Diagnosis not present

## 2021-06-26 DIAGNOSIS — I10 Essential (primary) hypertension: Secondary | ICD-10-CM | POA: Diagnosis not present

## 2021-06-26 DIAGNOSIS — I499 Cardiac arrhythmia, unspecified: Secondary | ICD-10-CM | POA: Diagnosis not present

## 2021-06-26 DIAGNOSIS — J449 Chronic obstructive pulmonary disease, unspecified: Secondary | ICD-10-CM | POA: Diagnosis not present

## 2021-06-26 DIAGNOSIS — R6889 Other general symptoms and signs: Secondary | ICD-10-CM | POA: Diagnosis not present

## 2021-06-26 DIAGNOSIS — F17210 Nicotine dependence, cigarettes, uncomplicated: Secondary | ICD-10-CM | POA: Diagnosis not present

## 2021-06-26 DIAGNOSIS — R569 Unspecified convulsions: Secondary | ICD-10-CM | POA: Diagnosis not present

## 2021-06-26 DIAGNOSIS — D689 Coagulation defect, unspecified: Secondary | ICD-10-CM | POA: Diagnosis not present

## 2021-06-26 LAB — HEPATIC FUNCTION PANEL
ALT: 35 U/L (ref 0–44)
AST: 26 U/L (ref 15–41)
Albumin: 3.2 g/dL — ABNORMAL LOW (ref 3.5–5.0)
Alkaline Phosphatase: 49 U/L (ref 38–126)
Bilirubin, Direct: 0.5 mg/dL — ABNORMAL HIGH (ref 0.0–0.2)
Indirect Bilirubin: 0.8 mg/dL (ref 0.3–0.9)
Total Bilirubin: 1.3 mg/dL — ABNORMAL HIGH (ref 0.3–1.2)
Total Protein: 6.1 g/dL — ABNORMAL LOW (ref 6.5–8.1)

## 2021-06-26 LAB — MAGNESIUM: Magnesium: 2.1 mg/dL (ref 1.7–2.4)

## 2021-06-26 LAB — CBC
HCT: 30.3 % — ABNORMAL LOW (ref 36.0–46.0)
Hemoglobin: 10.4 g/dL — ABNORMAL LOW (ref 12.0–15.0)
MCH: 34.1 pg — ABNORMAL HIGH (ref 26.0–34.0)
MCHC: 34.3 g/dL (ref 30.0–36.0)
MCV: 99.3 fL (ref 80.0–100.0)
Platelets: 176 10*3/uL (ref 150–400)
RBC: 3.05 MIL/uL — ABNORMAL LOW (ref 3.87–5.11)
RDW: 13.2 % (ref 11.5–15.5)
WBC: 7.4 10*3/uL (ref 4.0–10.5)
nRBC: 0 % (ref 0.0–0.2)

## 2021-06-26 LAB — BASIC METABOLIC PANEL
Anion gap: 20 — ABNORMAL HIGH (ref 5–15)
BUN: 119 mg/dL — ABNORMAL HIGH (ref 8–23)
CO2: 13 mmol/L — ABNORMAL LOW (ref 22–32)
Calcium: 7.7 mg/dL — ABNORMAL LOW (ref 8.9–10.3)
Chloride: 101 mmol/L (ref 98–111)
Creatinine, Ser: 9.75 mg/dL — ABNORMAL HIGH (ref 0.44–1.00)
GFR, Estimated: 4 mL/min — ABNORMAL LOW (ref >60)
Glucose, Bld: 131 mg/dL — ABNORMAL HIGH (ref 70–99)
Potassium: 5.2 mmol/L — ABNORMAL HIGH (ref 3.5–5.1)
Sodium: 134 mmol/L — ABNORMAL LOW (ref 135–145)

## 2021-06-26 LAB — RESP PANEL BY RT-PCR (FLU A&B, COVID) ARPGX2
Influenza A by PCR: NEGATIVE
Influenza B by PCR: NEGATIVE
SARS Coronavirus 2 by RT PCR: NEGATIVE

## 2021-06-26 LAB — ETHANOL: Alcohol, Ethyl (B): <10 mg/dL (ref <10)

## 2021-06-26 NOTE — ED Notes (Signed)
Per SW, OK to give cab voucher for pt to DC and be taken back to her car at the dialysis clinic.

## 2021-06-26 NOTE — ED Notes (Signed)
PO challenge per PA. Provided food and drink for pt and will reevaluate shortly.  Pt says she does not think she will be able to provide a urine sample.

## 2021-06-26 NOTE — ED Triage Notes (Signed)
Pt arrived via [facility] from the dialysis ctr. Pt was 15 mins into dialysis when she had a full body seizure that lasted 2 mins. Pt was in chair and did not get injured during seizure per dialysis staff. Prior to today, pt missed a week of dialysis. Pt is A&Ox4. VSS

## 2021-06-26 NOTE — ED Provider Notes (Addendum)
[facility] EMERGENCY DEPARTMENT Provider Note   CSN: [id number] Arrival date & time: 06/26/21  1410     History Chief Complaint  Patient presents with   Seizures    Cindy Burns is a 75 y.o. female past medical history of COPD, hypertension, diabetes and end-stage renal disease on dialysis presenting today with a complaint of a seizure from dialysis.  Patient only remembers the first part of dialysis. Patient denies history of seizure or syncope.  Is supposed to have Tuesday, Thursday and Saturday dialysis.  Patient reports that she only attends dialysis twice a week.  Reports that prior to today her dialysis session was 1 week ago. Endorses a cough that has developed "sometime recently.  COPD and currently smokes 1 ppd.  Lives by herself.  Denies weight loss or loss of appetite.  Denies alcohol use or any new medications.   Past Medical History:  Diagnosis Date   AKI (acute kidney injury) ([place]) 01/2020   Cancer ([place]) 2005   Breast Cancer   COPD (chronic obstructive pulmonary disease) ([place])    Depression    Diabetes mellitus without complication ([place])    Hyperlipidemia     Patient Active Problem List   Diagnosis Date Noted   Physical deconditioning 02/07/2021   Primary osteoarthritis of left knee 12/08/2020   Type 2 diabetes with nephropathy ([place]) 10/27/2020   Hyperkalemia 10/22/2020   Bilateral leg pain 08/25/2020   Folate deficiency 08/17/2020   Closed traumatic minimally displaced fracture of two ribs 08/15/2020   ESRD (end stage renal disease) ([place]) 04/30/2020   Hypocalcemia 01/19/2020   Encounter for central line placement    Hepatomegaly 07/30/2019   B12 deficiency 04/06/2019   Balance problem 03/18/2019   Ventral hernia 04/03/2017   Hypertension associated with diabetes ([place]) 02/28/2017   Chronic insomnia 10/26/2013   Diabetic autonomic neuropathy ([place]) 08/12/2013   PULMONARY NODULE 09/27/2009   ADENOCARCINOMA, BREAST 02/28/2009    Normocytic anemia 06/24/2008   Moderate COPD (chronic obstructive pulmonary disease) ([place]) 03/14/2008   Major depressive disorder, recurrent episode, moderate ([place]) 08/11/2007   Hyperlipidemia associated with type 2 diabetes mellitus ([place]) 04/30/2007   CIGARETTE SMOKER 04/22/2007    Past Surgical History:  Procedure Laterality Date   BREAST SURGERY  2005   CATARACT EXTRACTION     CHOLECYSTECTOMY  2013   IR FLUORO GUIDE CV LINE RIGHT  02/01/2020   IR US GUIDE VASC ACCESS RIGHT  02/01/2020     OB History   No obstetric history on file.     Family History  Problem Relation Age of Onset   Arthritis Mother    Alzheimer's disease Mother    Dementia Father    COPD Brother    COPD Brother     Social History   Tobacco Use   Smoking status: Every Day    Packs/day: 1.00    Years: 40.00    Pack years: 40.00    Types: Cigarettes   Smokeless tobacco: Never  Vaping Use   Vaping Use: Never used  Substance Use Topics   Alcohol use: No   Drug use: No    Home Medications Prior to Admission medications   Medication Sig Start Date End Date Taking? Authorizing Provider  albuterol (VENTOLIN HFA) 108 (90 Base) MCG/ACT inhaler TAKE 2 PUFFS BY MOUTH EVERY 6 HOURS AS NEEDED FOR WHEEZE OR SHORTNESS OF BREATH Patient taking differently: Inhale 2 puffs into the lungs every 6 (six) hours as needed for shortness of breath  or wheezing. 05/31/21   Diona Browner, Amy E, MD  amLODipine (NORVASC) 10 MG tablet Take 1 tablet (10 mg total) by mouth daily. 02/20/20 04/29/29  Alma Friendly, MD  diclofenac Sodium (VOLTAREN) 1 % GEL Apply 2-4 g topically 4 (four) times daily as needed (to painful sites).  08/22/20   [provider]  folic acid (FOLVITE) 1 MG tablet Take 1 tablet (1 mg total) by mouth daily. 08/17/20 08/17/21  Debbe Odea, MD  gabapentin (NEURONTIN) 300 MG capsule TAKE 1 CAPSULE BY MOUTH AFTER DIALYSIS ON DIALYSIS ON DAYS Patient taking differently: Take 300 mg by mouth See admin  instructions. After dialysis Tuesday,Thursday,saturday 01/18/21   Bedsole, Amy E, MD  ipratropium-albuterol (DUONEB) 0.5-2.5 (3) MG/3ML SOLN Take 3 mLs by nebulization every 6 (six) hours as needed. 02/19/20 04/29/29  Alma Friendly, MD  iron sucrose in sodium chloride 0.9 % 100 mL Inject into the vein. 10/19/20 10/18/21  [provider]  JANUVIA 25 MG tablet TAKE 1 TABLET BY MOUTH EVERY DAY Patient taking differently: Take 25 mg by mouth daily. 03/29/21   Bedsole, Amy E, MD  lidocaine (LIDODERM) 5 % Place 1 patch onto the skin daily. Remove & Discard patch within 12 hours or as directed by MD 06/11/21   Sponseller, Eugene Garnet R, PA-C  Methoxy PEG-Epoetin Beta (MIRCERA IJ) Mircera 11/07/20 11/06/21  [provider]  metoprolol tartrate (LOPRESSOR) 25 MG tablet Take 1 tablet (25 mg total) by mouth 2 (two) times daily. 02/05/21 06/11/21  Bedsole, Amy E, MD  rosuvastatin (CRESTOR) 20 MG tablet TAKE 1 TABLET BY MOUTH EVERY DAY Patient taking differently: Take 20 mg by mouth daily. 07/27/20   Bedsole, Amy E, MD  sevelamer carbonate (RENVELA) 800 MG tablet Take 1,600 mg by mouth See admin instructions. Take 1,600 mg by mouth with meals (when eating)    [provider]  sodium bicarbonate 650 MG tablet Take 2 tablets (1,300 mg total) by mouth 3 (three) times daily. Patient not taking: No sig reported 10/25/20   Debbe Odea, MD  traMADol (ULTRAM) 50 MG tablet TAKE 1/2 TABLET BY MOUTH TWICE DAILY AS NEEDED Patient taking differently: Take 25 mg by mouth every 12 (twelve) hours as needed for moderate pain. 04/18/21   Bedsole, Amy E, MD  venlafaxine XR (EFFEXOR-XR) 37.5 MG 24 hr capsule TAKE 1 CAPSULE (37.5 MG TOTAL) BY MOUTH AT BEDTIME. 04/18/21   Bedsole, Amy E, MD    Allergies    Metoprolol  Review of Systems   Review of Systems  Constitutional:  Negative for chills and fever.  HENT:  Negative for ear pain.   Respiratory:  Positive for cough. Negative for chest tightness and shortness  of breath.   Cardiovascular:  Negative for chest pain and palpitations.  Gastrointestinal:  Negative for nausea and vomiting.  Genitourinary:  Positive for difficulty urinating.       Patient makes less urine on dialysis.  Skin:  Negative for wound.  Neurological:  Negative for dizziness, seizures, syncope, weakness and headaches.  Psychiatric/Behavioral:  Negative for confusion.   All other systems reviewed and are negative.  Physical Exam Updated Vital Signs BP (!) 151/73   Pulse 90   Temp (!) 97.5 F (36.4 C) (Oral)   Resp (!) 23   Ht 5\' 2"  (1.575 m)   Wt 49.9 kg   SpO2 98%   BMI 20.12 kg/m   Physical Exam Vitals and nursing note reviewed.  Constitutional:      General: She is  not in acute distress.    Appearance: Normal appearance. She is not ill-appearing or diaphoretic.  HENT:     Head: Normocephalic and atraumatic.     Right Ear: Tympanic membrane normal.     Left Ear: Tympanic membrane normal.     Nose: Nose normal.     Mouth/Throat:     Mouth: Mucous membranes are moist.     Pharynx: Oropharynx is clear.     Comments: No sign of tongue biting or other oral trauma Eyes:     General: No scleral icterus.    Conjunctiva/sclera: Conjunctivae normal.  Cardiovascular:     Rate and Rhythm: Normal rate and regular rhythm.  Pulmonary:     Effort: Pulmonary effort is normal. No respiratory distress.     Breath sounds: Wheezing and rales present.  Abdominal:     General: Abdomen is flat. There is no distension.     Palpations: Abdomen is soft.     Tenderness: There is no abdominal tenderness.  Musculoskeletal:        General: No tenderness, deformity or signs of injury.     Comments: No signs of trauma, dislocation or bruising  Skin:    General: Skin is warm and dry.     Findings: No bruising or rash.  Neurological:     Mental Status: She is alert.  Psychiatric:        Mood and Affect: Mood normal.        Behavior: Behavior normal.    ED Results / Procedures  / Treatments   Labs (all labs ordered are listed, but only abnormal results are displayed) Labs Reviewed  BASIC METABOLIC PANEL - Abnormal; Notable for the following components:      Result Value   Sodium 134 (*)    Potassium 5.2 (*)    CO2 13 (*)    Glucose, Bld 131 (*)    BUN 119 (*)    Creatinine, Ser 9.75 (*)    Calcium 7.7 (*)    GFR, Estimated 4 (*)    Anion gap 20 (*)    All other components within normal limits  CBC - Abnormal; Notable for the following components:   RBC 3.05 (*)    Hemoglobin 10.4 (*)    HCT 30.3 (*)    MCH 34.1 (*)    All other components within normal limits  HEPATIC FUNCTION PANEL - Abnormal; Notable for the following components:   Total Protein 6.1 (*)    Albumin 3.2 (*)    Total Bilirubin 1.3 (*)    Bilirubin, Direct 0.5 (*)    All other components within normal limits  RESP PANEL BY RT-PCR (FLU A&B, COVID) ARPGX2  MAGNESIUM  ETHANOL  RAPID URINE DRUG SCREEN, HOSP PERFORMED  URINALYSIS, ROUTINE W REFLEX MICROSCOPIC  CBG MONITORING, ED    EKG None  Radiology DG Chest 2 View  Result Date: 06/26/2021 CLINICAL DATA:  Recent seizure activity during dialysis, initial encounter EXAM: CHEST - 2 VIEW COMPARISON:  06/11/2021 FINDINGS: Check shadow is stable. Aortic calcifications are seen. Dialysis catheter is noted in satisfactory position. Lungs demonstrate chronic markings bilaterally stable in appearance from the prior exam. No focal infiltrate or effusion is seen. Degenerative changes of the thoracic spine are noted with stable lower thoracic compression deformity identified. IMPRESSION: Chronic changes as described without acute abnormality. Electronically Signed   By: Inez Catalina M.D.   On: 06/26/2021 16:31   CT HEAD WO CONTRAST (5MM)  Result Date: 06/26/2021 CLINICAL  DATA:  Recent seizure activity during dialysis, initial encounter EXAM: CT HEAD WITHOUT CONTRAST TECHNIQUE: Contiguous axial images were obtained from the base of the skull  through the vertex without intravenous contrast. COMPARISON:  10/23/2020 FINDINGS: Brain: No evidence of acute infarction, hemorrhage, hydrocephalus, extra-axial collection or mass lesion/mass effect. Mild atrophic and ischemic changes are noted similar to that seen on prior exam Vascular: No hyperdense vessel or unexpected calcification. Skull: Normal. Negative for fracture or focal lesion. Sinuses/Orbits: No acute finding. Other: None. IMPRESSION: Chronic atrophic and ischemic changes stable from the prior exam. Electronically Signed   By: Inez Catalina M.D.   On: 06/26/2021 17:40    Procedures Procedures   Medications Ordered in ED Medications - No data to display  ED Course  I have reviewed the triage vital signs and the nursing notes. Discussed and seen by MD Tegeler.  Previous dialysis and office visit notes reviewed by me.  Pertinent labs & imaging results that were available during my care of the patient were reviewed by me and considered in my medical decision making (see chart for details).    MDM Rules/Calculators/A&P Cindy Burns is a 75 y.o. female past medical history of COPD, hypertension, diabetes and end-stage renal disease on dialysis presenting today with a complaint of a seizure from dialysis.  Patient only remembers the first part of dialysis. Patient denies history of seizure or syncope.  Is supposed to have Tuesday, Thursday and Saturday dialysis.  Patient reports that she only attends dialysis twice a week.  Reports that prior to today her dialysis session was 1 week ago. Endorses a cough that has developed "sometime recently.  COPD and currently smokes 1 ppd.  Lives by herself.  Denies weight loss or loss of appetite.  Denies alcohol use or any new medications.  Patient was evaluated by me at the bedside.  She is unsure what occurred earlier however reports that she is feeling totally fine now.  No confusion, alert and oriented.  Patient was without signs of trauma.   Throughout history taking and review of systems patient was adamant that she did not have any complaints other than a cough that has been going on for a long time.  Reports that she has COPD and continues to smoke 1 pack/day.  We discussed the importance of smoking cessation however I did obtain a chest x-ray.  There were no signs of infection on this x-ray.  Also no signs of rib fractures from her reported seizure earlier.  Patient is not compliant with her dialysis sessions and upon discussion she reports that she is just too tired to attend all of them.  She understands the importance of her 3 times weekly sessions and will try to increase her participation.  Her BMP and LFTs are without variation from patient's overall baseline. Hyperkalemia at 5.2 which is lower than her baseline. EKG without concern and confirmed by MD Tegeler.  Patient's ethanol was negative.  We are unable to get a urine sample in the department today due to patient's decrease in urine production.  I do not believe the results of this will change ultimate plan to discharge the patient.  Ethanol negative and no concern for withdrawal seizures.  Patient asymptomatic at this time.  Head CT negative. Currently eating a sandwich and drinking a Coca-Cola in the bed.  I believe her to be stable for discharge at this time.  Patient reports that her car is still at the dialysis clinic and she has  no means of transportation.  I spoke with social work who agreed to give the patient a Chief Strategy Officer.  Patient without further questions at this time.  Stable for discharge.  Final Clinical Impression(s) / ED Diagnoses Final diagnoses:  Loss of consciousness ([place])    Rx / DC Orders Results and diagnoses were explained to the patient. Return precautions discussed in full. Patient had no additional questions and expressed complete understanding.     Rhae Hammock, PA-C 06/26/21 1854    Darliss Ridgel 06/26/21 1855    Tegeler,  Gwenyth Allegra, MD 06/26/21 [phone]

## 2021-06-26 NOTE — Discharge Instructions (Addendum)
Please follow-up with your primary care provider about the event today. You do not have any bloodwork abnormalities or signs of trauma on your head and chest imaging. I encourage you to not miss dialysis sessions and to quit smoking. Missing these sessions as well as tobacco use are dangerous for your health.   It was a pleasure to meet you and I hope that you feel better!

## 2021-06-26 NOTE — ED Notes (Signed)
Patient transported to CT 

## 2021-06-26 NOTE — ED Notes (Signed)
Patient returned from CT

## 2021-06-30 DIAGNOSIS — N186 End stage renal disease: Secondary | ICD-10-CM | POA: Diagnosis not present

## 2021-06-30 DIAGNOSIS — Z992 Dependence on renal dialysis: Secondary | ICD-10-CM | POA: Diagnosis not present

## 2021-06-30 DIAGNOSIS — T8249XA Other complication of vascular dialysis catheter, initial encounter: Secondary | ICD-10-CM | POA: Diagnosis not present

## 2021-06-30 DIAGNOSIS — N2581 Secondary hyperparathyroidism of renal origin: Secondary | ICD-10-CM | POA: Diagnosis not present

## 2021-06-30 DIAGNOSIS — D688 Other specified coagulation defects: Secondary | ICD-10-CM | POA: Diagnosis not present

## 2021-06-30 DIAGNOSIS — D689 Coagulation defect, unspecified: Secondary | ICD-10-CM | POA: Diagnosis not present

## 2021-07-02 ENCOUNTER — Other Ambulatory Visit: Payer: Self-pay | Admitting: *Deleted

## 2021-07-02 NOTE — Patient Outreach (Signed)
[facility]) Care Management  07/02/2021  Cindy Burns Mar 05, 1946 [id number]   [facility] third Unsuccessful outreach  for [facility] engaged complex care patient     Cindy Burns was referred to [facility] on 10/26/20 by [facility] hospital liaison, Eliott Nine Referral Reason: complex care and disease management follow up calls and assess for further needs  Insurance: united [place] [employer] [place] (united healthcare dual complete (HMO SNP)     Last successful outreach on 05/30/21 Outreach attempt to the home number [phone]  No answer. [facility] RN CM left HIPAA [facility] Portability and Accountability Act) compliant voicemail message along with CM's contact info.     With review of EPIC it is noted the patient was seen in the ED on 06/26/21 ( last RN CM 06/22/21 outreach attempt) for los of consciousness/syncope vs seizure, when arrived in Hemodialysis (HD), It is noted she reported inconsistency with her Tuesday, Thursday, Saturday HD visits, smoking 1 ppd  She was recommended to follow up with pcp and neurology  Plan: [facility] RN CM scheduled this patient for [facility] case closure per workflow & pending a return call from patient Unsuccessful outreach letter sent on 06/22/21 Unsuccessful outreach on 06/18/21, 06/22/21, 07/02/21     Cindy Burns L. [name], RN, BSN, [place] Coordinator Office number [phone] Mobile number [phone]  Main [facility] number [phone] Fax number [phone]

## 2021-07-17 ENCOUNTER — Inpatient Hospital Stay (HOSPITAL_COMMUNITY)
Admission: EM | Admit: 2021-07-17 | Discharge: 2021-08-07 | DRG: 682 | Disposition: E | Payer: Medicare Other | Attending: Internal Medicine | Admitting: Internal Medicine

## 2021-07-17 ENCOUNTER — Emergency Department (HOSPITAL_COMMUNITY): Payer: Medicare Other

## 2021-07-17 ENCOUNTER — Encounter (HOSPITAL_COMMUNITY): Payer: Self-pay

## 2021-07-17 ENCOUNTER — Other Ambulatory Visit: Payer: Self-pay

## 2021-07-17 DIAGNOSIS — Z992 Dependence on renal dialysis: Secondary | ICD-10-CM | POA: Diagnosis not present

## 2021-07-17 DIAGNOSIS — Z825 Family history of asthma and other chronic lower respiratory diseases: Secondary | ICD-10-CM

## 2021-07-17 DIAGNOSIS — N2581 Secondary hyperparathyroidism of renal origin: Secondary | ICD-10-CM | POA: Diagnosis present

## 2021-07-17 DIAGNOSIS — E875 Hyperkalemia: Secondary | ICD-10-CM | POA: Diagnosis not present

## 2021-07-17 DIAGNOSIS — E43 Unspecified severe protein-calorie malnutrition: Secondary | ICD-10-CM | POA: Diagnosis not present

## 2021-07-17 DIAGNOSIS — I499 Cardiac arrhythmia, unspecified: Secondary | ICD-10-CM | POA: Diagnosis not present

## 2021-07-17 DIAGNOSIS — G9341 Metabolic encephalopathy: Secondary | ICD-10-CM | POA: Diagnosis present

## 2021-07-17 DIAGNOSIS — J9601 Acute respiratory failure with hypoxia: Secondary | ICD-10-CM | POA: Diagnosis not present

## 2021-07-17 DIAGNOSIS — R0602 Shortness of breath: Secondary | ICD-10-CM | POA: Diagnosis not present

## 2021-07-17 DIAGNOSIS — Z820 Family history of epilepsy and other diseases of the nervous system: Secondary | ICD-10-CM

## 2021-07-17 DIAGNOSIS — F32A Depression, unspecified: Secondary | ICD-10-CM | POA: Diagnosis not present

## 2021-07-17 DIAGNOSIS — Z66 Do not resuscitate: Secondary | ICD-10-CM | POA: Diagnosis present

## 2021-07-17 DIAGNOSIS — R569 Unspecified convulsions: Secondary | ICD-10-CM | POA: Diagnosis not present

## 2021-07-17 DIAGNOSIS — D631 Anemia in chronic kidney disease: Secondary | ICD-10-CM | POA: Diagnosis not present

## 2021-07-17 DIAGNOSIS — J432 Centrilobular emphysema: Secondary | ICD-10-CM | POA: Diagnosis present

## 2021-07-17 DIAGNOSIS — Z743 Need for continuous supervision: Secondary | ICD-10-CM | POA: Diagnosis not present

## 2021-07-17 DIAGNOSIS — Z681 Body mass index (BMI) 19 or less, adult: Secondary | ICD-10-CM | POA: Diagnosis not present

## 2021-07-17 DIAGNOSIS — R0600 Dyspnea, unspecified: Secondary | ICD-10-CM | POA: Diagnosis not present

## 2021-07-17 DIAGNOSIS — Z6820 Body mass index (BMI) 20.0-20.9, adult: Secondary | ICD-10-CM

## 2021-07-17 DIAGNOSIS — R54 Age-related physical debility: Secondary | ICD-10-CM | POA: Diagnosis present

## 2021-07-17 DIAGNOSIS — Z91199 Patient's noncompliance with other medical treatment and regimen due to unspecified reason: Secondary | ICD-10-CM

## 2021-07-17 DIAGNOSIS — R6889 Other general symptoms and signs: Secondary | ICD-10-CM | POA: Diagnosis not present

## 2021-07-17 DIAGNOSIS — R911 Solitary pulmonary nodule: Secondary | ICD-10-CM | POA: Diagnosis present

## 2021-07-17 DIAGNOSIS — Z8261 Family history of arthritis: Secondary | ICD-10-CM

## 2021-07-17 DIAGNOSIS — G9349 Other encephalopathy: Secondary | ICD-10-CM | POA: Diagnosis present

## 2021-07-17 DIAGNOSIS — E785 Hyperlipidemia, unspecified: Secondary | ICD-10-CM | POA: Diagnosis not present

## 2021-07-17 DIAGNOSIS — Z9049 Acquired absence of other specified parts of digestive tract: Secondary | ICD-10-CM

## 2021-07-17 DIAGNOSIS — D72829 Elevated white blood cell count, unspecified: Secondary | ICD-10-CM | POA: Diagnosis present

## 2021-07-17 DIAGNOSIS — E872 Acidosis, unspecified: Secondary | ICD-10-CM | POA: Diagnosis present

## 2021-07-17 DIAGNOSIS — R0902 Hypoxemia: Secondary | ICD-10-CM | POA: Diagnosis not present

## 2021-07-17 DIAGNOSIS — Z515 Encounter for palliative care: Secondary | ICD-10-CM

## 2021-07-17 DIAGNOSIS — Z20822 Contact with and (suspected) exposure to covid-19: Secondary | ICD-10-CM | POA: Diagnosis not present

## 2021-07-17 DIAGNOSIS — Z9115 Patient's noncompliance with renal dialysis: Secondary | ICD-10-CM

## 2021-07-17 DIAGNOSIS — I120 Hypertensive chronic kidney disease with stage 5 chronic kidney disease or end stage renal disease: Secondary | ICD-10-CM | POA: Diagnosis not present

## 2021-07-17 DIAGNOSIS — Z853 Personal history of malignant neoplasm of breast: Secondary | ICD-10-CM

## 2021-07-17 DIAGNOSIS — Z818 Family history of other mental and behavioral disorders: Secondary | ICD-10-CM

## 2021-07-17 DIAGNOSIS — Z7189 Other specified counseling: Secondary | ICD-10-CM | POA: Diagnosis not present

## 2021-07-17 DIAGNOSIS — E1122 Type 2 diabetes mellitus with diabetic chronic kidney disease: Secondary | ICD-10-CM | POA: Diagnosis present

## 2021-07-17 DIAGNOSIS — N186 End stage renal disease: Secondary | ICD-10-CM | POA: Diagnosis not present

## 2021-07-17 DIAGNOSIS — E8770 Fluid overload, unspecified: Secondary | ICD-10-CM | POA: Diagnosis present

## 2021-07-17 DIAGNOSIS — N250 Renal osteodystrophy: Secondary | ICD-10-CM | POA: Diagnosis not present

## 2021-07-17 DIAGNOSIS — D649 Anemia, unspecified: Secondary | ICD-10-CM | POA: Diagnosis not present

## 2021-07-17 DIAGNOSIS — F419 Anxiety disorder, unspecified: Secondary | ICD-10-CM | POA: Diagnosis present

## 2021-07-17 DIAGNOSIS — F17210 Nicotine dependence, cigarettes, uncomplicated: Secondary | ICD-10-CM | POA: Diagnosis present

## 2021-07-17 LAB — RENAL FUNCTION PANEL
Albumin: 3.5 g/dL (ref 3.5–5.0)
BUN: >300 mg/dL — ABNORMAL HIGH (ref 8–23)
CO2: <7 mmol/L — ABNORMAL LOW (ref 22–32)
Calcium: 8.1 mg/dL — ABNORMAL LOW (ref 8.9–10.3)
Chloride: 104 mmol/L (ref 98–111)
Creatinine, Ser: 14.28 mg/dL — ABNORMAL HIGH (ref 0.44–1.00)
GFR, Estimated: 2 mL/min — ABNORMAL LOW (ref >60)
Glucose, Bld: 260 mg/dL — ABNORMAL HIGH (ref 70–99)
Phosphorus: 7.7 mg/dL — ABNORMAL HIGH (ref 2.5–4.6)
Potassium: 5.4 mmol/L — ABNORMAL HIGH (ref 3.5–5.1)
Sodium: 141 mmol/L (ref 135–145)

## 2021-07-17 LAB — CBC
HCT: 27.5 % — ABNORMAL LOW (ref 36.0–46.0)
Hemoglobin: 9.2 g/dL — ABNORMAL LOW (ref 12.0–15.0)
MCH: 33.1 pg (ref 26.0–34.0)
MCHC: 33.5 g/dL (ref 30.0–36.0)
MCV: 98.9 fL (ref 80.0–100.0)
Platelets: 167 10*3/uL (ref 150–400)
RBC: 2.78 MIL/uL — ABNORMAL LOW (ref 3.87–5.11)
RDW: 13.3 % (ref 11.5–15.5)
WBC: 11.3 10*3/uL — ABNORMAL HIGH (ref 4.0–10.5)
nRBC: 0 % (ref 0.0–0.2)

## 2021-07-17 LAB — CBC WITH DIFFERENTIAL/PLATELET
Abs Immature Granulocytes: 0.06 10*3/uL (ref 0.00–0.07)
Basophils Absolute: 0 10*3/uL (ref 0.0–0.1)
Basophils Relative: 0 %
Eosinophils Absolute: 0 10*3/uL (ref 0.0–0.5)
Eosinophils Relative: 0 %
HCT: 32.8 % — ABNORMAL LOW (ref 36.0–46.0)
Hemoglobin: 10.7 g/dL — ABNORMAL LOW (ref 12.0–15.0)
Immature Granulocytes: 1 %
Lymphocytes Relative: 3 %
Lymphs Abs: 0.4 10*3/uL — ABNORMAL LOW (ref 0.7–4.0)
MCH: 33.5 pg (ref 26.0–34.0)
MCHC: 32.6 g/dL (ref 30.0–36.0)
MCV: 102.8 fL — ABNORMAL HIGH (ref 80.0–100.0)
Monocytes Absolute: 0.2 10*3/uL (ref 0.1–1.0)
Monocytes Relative: 2 %
Neutro Abs: 12.3 10*3/uL — ABNORMAL HIGH (ref 1.7–7.7)
Neutrophils Relative %: 94 %
Platelets: 184 10*3/uL (ref 150–400)
RBC: 3.19 MIL/uL — ABNORMAL LOW (ref 3.87–5.11)
RDW: 13.4 % (ref 11.5–15.5)
WBC: 13 10*3/uL — ABNORMAL HIGH (ref 4.0–10.5)
nRBC: 0 % (ref 0.0–0.2)

## 2021-07-17 LAB — I-STAT VENOUS BLOOD GAS, ED
Acid-base deficit: 23 mmol/L — ABNORMAL HIGH (ref 0.0–2.0)
Bicarbonate: 5.3 mmol/L — ABNORMAL LOW (ref 20.0–28.0)
Calcium, Ion: 0.93 mmol/L — ABNORMAL LOW (ref 1.15–1.40)
HCT: 31 % — ABNORMAL LOW (ref 36.0–46.0)
Hemoglobin: 10.5 g/dL — ABNORMAL LOW (ref 12.0–15.0)
O2 Saturation: 87 %
Potassium: 7.4 mmol/L (ref 3.5–5.1)
Sodium: 137 mmol/L (ref 135–145)
TCO2: 6 mmol/L — ABNORMAL LOW (ref 22–32)
pCO2, Ven: 18.3 mmHg — CL (ref 44.0–60.0)
pH, Ven: 7.072 — CL (ref 7.250–7.430)
pO2, Ven: 72 mmHg — ABNORMAL HIGH (ref 32.0–45.0)

## 2021-07-17 LAB — HEPATITIS B SURFACE ANTIBODY,QUALITATIVE: Hep B S Ab: REACTIVE — AB

## 2021-07-17 LAB — LACTIC ACID, PLASMA: Lactic Acid, Venous: 3.6 mmol/L (ref 0.5–1.9)

## 2021-07-17 LAB — MAGNESIUM: Magnesium: 2.2 mg/dL (ref 1.7–2.4)

## 2021-07-17 LAB — BASIC METABOLIC PANEL
Anion gap: 29 — ABNORMAL HIGH (ref 5–15)
BUN: >300 mg/dL — ABNORMAL HIGH (ref 8–23)
BUN: >300 mg/dL — ABNORMAL HIGH (ref 8–23)
CO2: <7 mmol/L — ABNORMAL LOW (ref 22–32)
CO2: 9 mmol/L — ABNORMAL LOW (ref 22–32)
Calcium: 8 mg/dL — ABNORMAL LOW (ref 8.9–10.3)
Calcium: 8.2 mg/dL — ABNORMAL LOW (ref 8.9–10.3)
Chloride: 107 mmol/L (ref 98–111)
Chloride: 104 mmol/L (ref 98–111)
Creatinine, Ser: 14.1 mg/dL — ABNORMAL HIGH (ref 0.44–1.00)
Creatinine, Ser: 14.31 mg/dL — ABNORMAL HIGH (ref 0.44–1.00)
GFR, Estimated: 3 mL/min — ABNORMAL LOW (ref >60)
GFR, Estimated: 2 mL/min — ABNORMAL LOW (ref >60)
Glucose, Bld: 147 mg/dL — ABNORMAL HIGH (ref 70–99)
Glucose, Bld: 224 mg/dL — ABNORMAL HIGH (ref 70–99)
Potassium: >7.5 mmol/L (ref 3.5–5.1)
Potassium: 5 mmol/L (ref 3.5–5.1)
Sodium: 138 mmol/L (ref 135–145)
Sodium: 142 mmol/L (ref 135–145)

## 2021-07-17 LAB — RESP PANEL BY RT-PCR (FLU A&B, COVID) ARPGX2
Influenza A by PCR: NEGATIVE
Influenza B by PCR: NEGATIVE
SARS Coronavirus 2 by RT PCR: NEGATIVE

## 2021-07-17 LAB — PHOSPHORUS: Phosphorus: 8.2 mg/dL — ABNORMAL HIGH (ref 2.5–4.6)

## 2021-07-17 LAB — PROTIME-INR
INR: 1.2 (ref 0.8–1.2)
Prothrombin Time: 14.7 seconds (ref 11.4–15.2)

## 2021-07-17 LAB — GLUCOSE, CAPILLARY
Glucose-Capillary: 263 mg/dL — ABNORMAL HIGH (ref 70–99)
Glucose-Capillary: 162 mg/dL — ABNORMAL HIGH (ref 70–99)
Glucose-Capillary: 129 mg/dL — ABNORMAL HIGH (ref 70–99)

## 2021-07-17 LAB — BRAIN NATRIURETIC PEPTIDE: B Natriuretic Peptide: 115.8 pg/mL — ABNORMAL HIGH (ref 0.0–100.0)

## 2021-07-17 LAB — PROCALCITONIN: Procalcitonin: 0.26 ng/mL

## 2021-07-17 LAB — HEMOGLOBIN A1C
Hgb A1c MFr Bld: 5.8 % — ABNORMAL HIGH (ref 4.8–5.6)
Mean Plasma Glucose: 119.76 mg/dL

## 2021-07-17 LAB — HEPATITIS B SURFACE ANTIGEN: Hepatitis B Surface Ag: NONREACTIVE

## 2021-07-17 LAB — APTT: aPTT: 32 seconds (ref 24–36)

## 2021-07-17 MED ORDER — LIDOCAINE-PRILOCAINE 2.5-2.5 % EX CREA: 1.0000 "application " | TOPICAL_CREAM | CUTANEOUS | Status: DC | PRN

## 2021-07-17 MED ORDER — INSULIN ASPART 100 UNIT/ML IV SOLN
5.0000 [IU] | Freq: Once | INTRAVENOUS | Status: AC
  Administered 2021-07-17: 5 [IU] via INTRAVENOUS

## 2021-07-17 MED ORDER — INSULIN ASPART 100 UNIT/ML IJ SOLN: 0.0000 [IU] | INTRAMUSCULAR | Status: DC

## 2021-07-17 MED ORDER — SODIUM ZIRCONIUM CYCLOSILICATE 10 G PO PACK
10.0000 g | PACK | Freq: Two times a day (BID) | ORAL | Status: AC
  Administered 2021-07-17 (×2): 10 g via ORAL
  Filled 2021-07-17 (×2): qty 1

## 2021-07-17 MED ORDER — PENTAFLUOROPROP-TETRAFLUOROETH EX AERO: 1.0000 "application " | INHALATION_SPRAY | CUTANEOUS | Status: DC | PRN

## 2021-07-17 MED ORDER — HEPARIN SODIUM (PORCINE) 1000 UNIT/ML DIALYSIS
1000.0000 [IU] | INTRAMUSCULAR | Status: DC | PRN
  Filled 2021-07-17 (×2): qty 1

## 2021-07-17 MED ORDER — SODIUM CHLORIDE 0.9 % IV SOLN: 100.0000 mL | INTRAVENOUS | Status: DC | PRN

## 2021-07-17 MED ORDER — CALCIUM GLUCONATE-NACL 1-0.675 GM/50ML-% IV SOLN
1.0000 g | Freq: Once | INTRAVENOUS | Status: AC
  Administered 2021-07-17: 1000 mg via INTRAVENOUS
  Filled 2021-07-17: qty 50

## 2021-07-17 MED ORDER — LIDOCAINE HCL (PF) 1 % IJ SOLN: 5.0000 mL | INTRAMUSCULAR | Status: DC | PRN

## 2021-07-17 MED ORDER — ALTEPLASE 2 MG IJ SOLR
2.0000 mg | Freq: Once | INTRAMUSCULAR | Status: DC | PRN
Start: 2021-07-17 — End: 2021-07-19

## 2021-07-17 MED ORDER — ALBUTEROL SULFATE (2.5 MG/3ML) 0.083% IN NEBU
10.0000 mg/h | INHALATION_SOLUTION | RESPIRATORY_TRACT | Status: DC
  Administered 2021-07-17: 10 mg/h via RESPIRATORY_TRACT
  Filled 2021-07-17 (×2): qty 12

## 2021-07-17 MED ORDER — SODIUM BICARBONATE 8.4 % IV SOLN
100.0000 meq | Freq: Once | INTRAVENOUS | Status: AC
  Administered 2021-07-17: 100 meq via INTRAVENOUS
  Filled 2021-07-17: qty 50

## 2021-07-17 MED ORDER — SODIUM CHLORIDE 0.9 % IV SOLN: INTRAVENOUS | Status: DC

## 2021-07-17 MED ORDER — CHLORHEXIDINE GLUCONATE CLOTH 2 % EX PADS
6.0000 | MEDICATED_PAD | Freq: Every day | CUTANEOUS | Status: DC
  Administered 2021-07-17 – 2021-07-18 (×2): 6 via TOPICAL

## 2021-07-17 MED ORDER — DEXTROSE 50 % IV SOLN
1.0000 | Freq: Once | INTRAVENOUS | Status: AC
  Administered 2021-07-17: 50 mL via INTRAVENOUS
  Filled 2021-07-17: qty 50

## 2021-07-17 MED ORDER — ORAL CARE MOUTH RINSE
15.0000 mL | Freq: Two times a day (BID) | OROMUCOSAL | Status: DC
  Administered 2021-07-17: 15 mL via OROMUCOSAL

## 2021-07-17 MED ORDER — DOXERCALCIFEROL 4 MCG/2ML IV SOLN
1.0000 ug | INTRAVENOUS | Status: DC
  Filled 2021-07-17: qty 2

## 2021-07-17 MED ORDER — IPRATROPIUM-ALBUTEROL 0.5-2.5 (3) MG/3ML IN SOLN: 3.0000 mL | Freq: Four times a day (QID) | RESPIRATORY_TRACT | Status: DC | PRN

## 2021-07-17 NOTE — ED Triage Notes (Signed)
Patient brought in via ems from home. Patient states she has been getting worsening SOB for a few weeks. States she has not been to dialysis in a month. EMS gave albuterol tx. VSS A&Ox4

## 2021-07-17 NOTE — Progress Notes (Signed)
Notified by HD RN that w/in first 1h of HD pt had 3 episodes that had shaking, distant eyes, and not interactive, a distinct change from her baseline mental status.    Probably best to assume this is seizure, ? Uremic or from dysequilibrium but doubt as pt was on low BFR/DFR and this was early in Tx.  Pt seen after return to 58M. Awake, oriented to self, year, location.  Nonfocal.  Plan for labs later tonight to make sure K is ok.  Then will reassess in AM for CRRT or bedside iHD.

## 2021-07-17 NOTE — ED Notes (Signed)
EKG completed

## 2021-07-17 NOTE — ED Provider Notes (Signed)
[facility] EMERGENCY DEPARTMENT Provider Note   CSN: [id number] Arrival date & time: 07/30/2021  1141     History Chief Complaint  Patient presents with   Shortness of Cindy Burns is a 75 y.o. female with a history of COPD (chronic smoker, reports she is not on home oxygen), diabetes, end-stage renal disease on dialysis Monday Wednesday Friday, presenting from home with shortness of breath.  The patient is a poor historian.  She reports that she has been feeling short of breath for "some time" but cannot clarify how long.  She said it was worse this morning.  EMS reports she had told him she had not been to dialysis in a month, but the patient tells me that she last went on Wednesday (6 days ago), but has missed many sessions this month.  She denies any chest pain or pressure.  She reports this feels like her COPD.  She is not sure whether she has been using nebulizer treatments.  She continues to smoke "occasionally".  HPI     Past Medical History:  Diagnosis Date   AKI (acute kidney injury) ([place]) 01/2020   Cancer ([place]) 2005   Breast Cancer   COPD (chronic obstructive pulmonary disease) ([place])    Depression    Diabetes mellitus without complication ([place])    Hyperlipidemia     Patient Active Problem List   Diagnosis Date Noted   Physical deconditioning 02/07/2021   Primary osteoarthritis of left knee 12/08/2020   Type 2 diabetes with nephropathy ([place]) 10/27/2020   Hyperkalemia 10/22/2020   Bilateral leg pain 08/25/2020   Folate deficiency 08/17/2020   Closed traumatic minimally displaced fracture of two ribs 08/15/2020   ESRD (end stage renal disease) ([place]) 04/30/2020   Hypocalcemia 01/19/2020   Encounter for central line placement    Hepatomegaly 07/30/2019   B12 deficiency 04/06/2019   Balance problem 03/18/2019   Ventral hernia 04/03/2017   Hypertension associated with diabetes ([place]) 02/28/2017   Chronic insomnia 10/26/2013   Diabetic  autonomic neuropathy ([place]) 08/12/2013   PULMONARY NODULE 09/27/2009   ADENOCARCINOMA, BREAST 02/28/2009   Normocytic anemia 06/24/2008   Moderate COPD (chronic obstructive pulmonary disease) ([place]) 03/14/2008   Major depressive disorder, recurrent episode, moderate ([place]) 08/11/2007   Hyperlipidemia associated with type 2 diabetes mellitus ([place]) 04/30/2007   CIGARETTE SMOKER 04/22/2007    Past Surgical History:  Procedure Laterality Date   BREAST SURGERY  2005   CATARACT EXTRACTION     CHOLECYSTECTOMY  2013   IR FLUORO GUIDE CV LINE RIGHT  02/01/2020   IR US GUIDE VASC ACCESS RIGHT  02/01/2020     OB History   No obstetric history on file.     Family History  Problem Relation Age of Onset   Arthritis Mother    Alzheimer's disease Mother    Dementia Father    COPD Brother    COPD Brother     Social History   Tobacco Use   Smoking status: Every Day    Packs/day: 1.00    Years: 40.00    Pack years: 40.00    Types: Cigarettes   Smokeless tobacco: Never  Vaping Use   Vaping Use: Never used  Substance Use Topics   Alcohol use: No   Drug use: No    Home Medications Prior to Admission medications   Medication Sig Start Date End Date Taking? Authorizing Provider  amLODipine (NORVASC) 10 MG tablet Take 1 tablet (10 mg  total) by mouth daily. 02/20/20 04/29/29 Yes Alma Friendly, MD  gabapentin (NEURONTIN) 300 MG capsule TAKE 1 CAPSULE BY MOUTH AFTER DIALYSIS ON DIALYSIS ON DAYS Patient taking differently: Take 300 mg by mouth See admin instructions. After dialysis Tuesday,Thursday,saturday 01/18/21  Yes Bedsole, Amy E, MD  JANUVIA 25 MG tablet TAKE 1 TABLET BY MOUTH EVERY DAY Patient taking differently: Take 25 mg by mouth daily. 03/29/21  Yes Bedsole, Amy E, MD  lidocaine (LIDODERM) 5 % Place 1 patch onto the skin daily. Remove & Discard patch within 12 hours or as directed by MD 06/11/21  Yes Sponseller, Eugene Garnet R, PA-C  metoprolol tartrate (LOPRESSOR) 25 MG tablet Take 1  tablet (25 mg total) by mouth 2 (two) times daily. 02/05/21 07/31/2021 Yes Bedsole, Amy E, MD  rosuvastatin (CRESTOR) 20 MG tablet TAKE 1 TABLET BY MOUTH EVERY DAY Patient taking differently: Take 20 mg by mouth daily. 07/27/20  Yes Bedsole, Amy E, MD  sevelamer carbonate (RENVELA) 800 MG tablet Take 1,600 mg by mouth See admin instructions. Take 1,600 mg by mouth with meals (when eating)   Yes [provider]  traMADol (ULTRAM) 50 MG tablet TAKE 1/2 TABLET BY MOUTH TWICE DAILY AS NEEDED Patient taking differently: Take 25 mg by mouth every 12 (twelve) hours as needed for moderate pain. 04/18/21  Yes Bedsole, Amy E, MD  venlafaxine XR (EFFEXOR-XR) 37.5 MG 24 hr capsule TAKE 1 CAPSULE (37.5 MG TOTAL) BY MOUTH AT BEDTIME. Patient taking differently: Take 37.5 mg by mouth at bedtime. 04/18/21  Yes Bedsole, Amy E, MD  albuterol (VENTOLIN HFA) 108 (90 Base) MCG/ACT inhaler TAKE 2 PUFFS BY MOUTH EVERY 6 HOURS AS NEEDED FOR WHEEZE OR SHORTNESS OF BREATH Patient not taking: Reported on 07/16/2021 05/31/21   [name], MD  folic acid (FOLVITE) 1 MG tablet Take 1 tablet (1 mg total) by mouth daily. Patient taking differently: Take 1 mg by mouth See admin instructions. Take on dialysis days, Tuesday, Thursday, Saturday 08/17/20 08/17/21  Debbe Odea, MD  ipratropium-albuterol (DUONEB) 0.5-2.5 (3) MG/3ML SOLN Take 3 mLs by nebulization every 6 (six) hours as needed. Patient not taking: Reported on 07/07/2021 02/19/20 04/29/29  Alma Friendly, MD  iron sucrose in sodium chloride 0.9 % 100 mL Inject into the vein. 10/19/20 10/18/21  [provider]  Methoxy PEG-Epoetin Beta (MIRCERA IJ) Mircera 11/07/20 11/06/21  [provider]  sodium bicarbonate 650 MG tablet Take 2 tablets (1,300 mg total) by mouth 3 (three) times daily. Patient not taking: No sig reported 10/25/20   Debbe Odea, MD    Allergies    Patient has no active allergies.  Review of Systems   Review of Systems   Constitutional:  Negative for chills and fever.  Eyes:  Negative for photophobia and visual disturbance.  Respiratory:  Positive for shortness of breath. Negative for cough.   Cardiovascular:  Negative for chest pain and palpitations.  Gastrointestinal:  Negative for abdominal pain and vomiting.  Musculoskeletal:  Negative for arthralgias and back pain.  Skin:  Negative for color change and rash.  Neurological:  Negative for syncope and headaches.  All other systems reviewed and are negative.  Physical Exam Updated Vital Signs BP (!) 144/76   Pulse (!) 134   Temp 97.6 F (36.4 C) (Oral)   Resp (!) 22   Ht 5\' 2"  (1.575 m)   Wt 49.9 kg   SpO2 93%   BMI 20.12 kg/m   Physical Exam Constitutional:  General: She is not in acute distress.    Comments: Thin, frail  HENT:     Head: Normocephalic and atraumatic.  Eyes:     Conjunctiva/sclera: Conjunctivae normal.     Pupils: Pupils are equal, round, and reactive to light.  Cardiovascular:     Rate and Rhythm: Normal rate and regular rhythm.  Pulmonary:     Effort: Pulmonary effort is normal. No respiratory distress.     Comments: 97% on 2L [place] Crackles lung bases, wheezing bilaterally (expiratory) Abdominal:     General: There is no distension.     Tenderness: There is no abdominal tenderness.  Musculoskeletal:     Comments: Chest wall dialysis port  Skin:    General: Skin is warm and dry.  Neurological:     General: No focal deficit present.     Mental Status: She is alert. Mental status is at baseline.    ED Results / Procedures / Treatments   Labs (all labs ordered are listed, but only abnormal results are displayed) Labs Reviewed  BASIC METABOLIC PANEL - Abnormal; Notable for the following components:      Result Value   Potassium >7.5 (*)    CO2 <7 (*)    Glucose, Bld 147 (*)    BUN >300 (*)    Creatinine, Ser 14.10 (*)    Calcium 8.0 (*)    GFR, Estimated 3 (*)    All other components within normal  limits  CBC WITH DIFFERENTIAL/PLATELET - Abnormal; Notable for the following components:   WBC 13.0 (*)    RBC 3.19 (*)    Hemoglobin 10.7 (*)    HCT 32.8 (*)    MCV 102.8 (*)    Neutro Abs 12.3 (*)    Lymphs Abs 0.4 (*)    All other components within normal limits  BRAIN NATRIURETIC PEPTIDE - Abnormal; Notable for the following components:   B Natriuretic Peptide 115.8 (*)    All other components within normal limits  PHOSPHORUS - Abnormal; Notable for the following components:   Phosphorus 8.2 (*)    All other components within normal limits  I-STAT VENOUS BLOOD GAS, ED - Abnormal; Notable for the following components:   pH, Ven 7.072 (*)    pCO2, Ven 18.3 (*)    pO2, Ven 72.0 (*)    Bicarbonate 5.3 (*)    TCO2 6 (*)    Acid-base deficit 23.0 (*)    Potassium 7.4 (*)    Calcium, Ion 0.93 (*)    HCT 31.0 (*)    Hemoglobin 10.5 (*)    All other components within normal limits  RESP PANEL BY RT-PCR (FLU A&B, COVID) ARPGX2  MAGNESIUM  HEMOGLOBIN A1C  CBC  HEPATITIS B SURFACE ANTIGEN  HEPATITIS B SURFACE ANTIBODY,QUALITATIVE  HEPATITIS B SURFACE ANTIBODY, QUANTITATIVE    EKG EKG Interpretation  Date/Time:  Tuesday July 17 2021 11:42:53 EDT Ventricular Rate:  96 PR Interval:  179 QRS Duration: 115 QT Interval:  369 QTC Calculation: 467 R Axis:   66 Text Interpretation: Sinus rhythm Nonspecific intraventricular conduction delay Confirmed by Octaviano Glow [phone]) on 07/30/2021 12:18:01 PM  Radiology DG Chest Portable 1 View  Result Date: 07/10/2021 CLINICAL DATA:  Shortness of breath, missed dialysis EXAM: PORTABLE CHEST 1 VIEW COMPARISON:  Chest radiograph 06/26/2021 FINDINGS: A right-sided dialysis catheter is in stable position. The cardiomediastinal silhouette is stable. There is focal opacity projecting over the left midlung measuring 1.4 cm which is similar compared to the most  recent prior study of 06/26/2021 but is increased in conspicuity compared to more  remote prior studies. There is no focal consolidation. There is no evidence of pulmonary edema. There is no significant pleural effusion. There is no pneumothorax. There is no acute osseous abnormality. IMPRESSION: 1. No evidence of pulmonary edema. 2. Focal opacity projecting over the left midlung measuring 1.4 cm, unchanged compared to the most recent prior study but increased in conspicuity compared to more remote studies. Recommend characterization with nonemergent chest CT. Electronically Signed   By: Valetta Mole M.D.   On: 07/30/2021 12:47    Procedures .Critical Care Performed by: Wyvonnia Dusky, MD Authorized by: Wyvonnia Dusky, MD   Critical care provider statement:    Critical care time (minutes):  45   Critical care was necessary to treat or prevent imminent or life-threatening deterioration of the following conditions:  Circulatory failure   Critical care was time spent personally by me on the following activities:  Ordering and performing treatments and interventions, ordering and review of laboratory studies, ordering and review of radiographic studies, pulse oximetry, review of old charts and discussions with consultants   Care discussed with: admitting provider   Comments:     IV hyperK treatment, discussion with consultants   Medications Ordered in ED Medications  albuterol (PROVENTIL) (2.5 MG/3ML) 0.083% nebulizer solution (10 mg/hr Nebulization New Bag/Given 08/04/2021 1240)  Chlorhexidine Gluconate Cloth 2 % PADS 6 each (has no administration in time range)  pentafluoroprop-tetrafluoroeth (GEBAUERS) aerosol 1 application (has no administration in time range)  lidocaine (PF) (XYLOCAINE) 1 % injection 5 mL (has no administration in time range)  lidocaine-prilocaine (EMLA) cream 1 application (has no administration in time range)  0.9 %  sodium chloride infusion (has no administration in time range)  0.9 %  sodium chloride infusion (has no administration in time range)   heparin injection 1,000 Units (has no administration in time range)  alteplase (CATHFLO ACTIVASE) injection 2 mg (has no administration in time range)  sodium zirconium cyclosilicate (LOKELMA) packet 10 g (10 g Oral Given 07/10/2021 1450)  doxercalciferol (HECTOROL) injection 1 mcg (has no administration in time range)  insulin aspart (novoLOG) injection 0-6 Units (has no administration in time range)  calcium gluconate 1 g/ 50 mL sodium chloride IVPB (0 mg Intravenous Stopped 08/01/2021 1541)  insulin aspart (novoLOG) injection 5 Units (5 Units Intravenous Given 08/04/2021 1450)    And  dextrose 50 % solution 50 mL (50 mLs Intravenous Given 07/15/2021 1449)  sodium bicarbonate injection 100 mEq (100 mEq Intravenous Given 07/16/2021 1659)    ED Course  I have reviewed the triage vital signs and the nursing notes.  Pertinent labs & imaging results that were available during my care of the patient were reviewed by me and considered in my medical decision making (see chart for details).  Shortness of breath, differential includes pulmonary edema or volume overload versus COPD versus mixed versus anemia versus other  I personally ordered, interpreted, and reviewed the patient's labs, chest x-ray, and EKG, showing hyperK, acidosis, low bicarb.  BUN > 300.  DG chest with no evident PNA but continued mass noted  Pt wll need dialysis No encephalopathy here Albuterol nebulizer started  Clinical Course as of 08/05/2021 1754  Tue Jul 17, 2021  1424 Nephro consulted for emergent dialysis, spoke to Dr Candiss Norse [MT]  1429 hyperK meds ordered, on reassessment wheezing improved but remains significant, needs continued respiratory treatment [MT]  1450 Critical care team consulted regarding  lab abnormalities/level of care - pt pending dialysis now and will require admission; unclear if she will require full ICU vs stepdown, and how much correction can be expected for acidosis and hyperkalemia and BUN following  dialysis [MT]    Clinical Course User Index [MT] Roshini Fulwider, Carola Rhine, MD    Final Clinical Impression(s) / ED Diagnoses Final diagnoses:  Hyperkalemia  Acidosis  Shortness of breath    Rx / DC Orders ED Discharge Orders     None        Wyvonnia Dusky, MD 07/10/2021 1754

## 2021-07-17 NOTE — Consult Note (Signed)
ESRD Consult Note   Assessment/Recommendations:   # ESRD:  Outpatient orders: MWF, [place], 3 hours 15 minutes, 2K, 2 Cal, 400/autoflow 1.5, EDW 47.5 kg.  Mircera 50 mcg every 2 weeks, hectorol  -Given very high BUN and lack of dialysis for some time, will re-initiate HD slowly, HD today, tomorrow, and then back on schedule Thursday  # Dyspnea -no edema on CXR, likely related to COPD and acidemia? Nonetheless, will UF as tolerated  # Metabolic acidosis/acidemia -likely secondary to lack of HD, HD as above, consider bicarb gtt and ICU evaluation given respiratory status  #Hyperkalemia -s/p shifting agents, lokelma ordered for today, HD as above  # Volume/ hypertension: EDW 47.5kg. Will UF as tolerated  # Anemia of Chronic Kidney Disease: Hemoglobin 10.7. Receives mircera 50 mcg q2wks (last dose 9/27).  # Secondary Hyperparathyroidism/Hyperphosphatemia: resume home binders, resume hectorol   # Vascular access: [facility]  # Additional recommendations: - Dose all meds for creatinine clearance < 10 ml/min  - Unless absolutely necessary, no MRIs with gadolinium.  - Implement save arm precautions.  Prefer needle sticks in the dorsum of the hands or wrists.  No blood pressure measurements in arm. - If blood transfusion is requested during hemodialysis sessions, please alert [place] prior to the session.  - If a hemodialysis catheter line culture is requested, please alert [place] as only hemodialysis nurses are able to collect those specimens.   Recommendations were discussed with the primary team.  Gean Quint, MD [facility]  History of Present Illness: Cindy Burns is a/an 75 y.o. female with a past medical history of ESRD with history of noncompliance with HD, COPD, DM2, tobacco use who presents with SOB. Has not had HD since 07/03/21 per review of her outpatient records. Patient reports being SOB in the last few weeks. She does not remember the last time she had dialysis. Patient  is a poor historian.   Medications:  Current Facility-Administered Medications  Medication Dose Route Frequency Provider Last Rate Last Admin   albuterol (PROVENTIL) (2.5 MG/3ML) 0.083% nebulizer solution  10 mg/hr Nebulization Continuous Wyvonnia Dusky, MD 12 mL/hr at 07/14/2021 1240 10 mg/hr at 08/05/2021 1240   calcium gluconate 1 g/ 50 mL sodium chloride IVPB  1 g Intravenous Once Wyvonnia Dusky, MD       Derrill Memo ON 07/18/2021] Chlorhexidine Gluconate Cloth 2 % PADS 6 each  6 each Topical Q0600 Gean Quint, MD       insulin aspart (novoLOG) injection 5 Units  5 Units Intravenous Once Trifan, Carola Rhine, MD       And   dextrose 50 % solution 50 mL  1 ampule Intravenous Once Trifan, Carola Rhine, MD       sodium zirconium cyclosilicate (LOKELMA) packet 10 g  10 g Oral BID Gean Quint, MD       Current Outpatient Medications  Medication Sig Dispense Refill   amLODipine (NORVASC) 10 MG tablet Take 1 tablet (10 mg total) by mouth daily. 30 tablet 0   gabapentin (NEURONTIN) 300 MG capsule TAKE 1 CAPSULE BY MOUTH AFTER DIALYSIS ON DIALYSIS ON DAYS (Patient taking differently: Take 300 mg by mouth See admin instructions. After dialysis Tuesday,Thursday,saturday) 36 capsule 3   JANUVIA 25 MG tablet TAKE 1 TABLET BY MOUTH EVERY DAY (Patient taking differently: Take 25 mg by mouth daily.) 30 tablet 5   lidocaine (LIDODERM) 5 % Place 1 patch onto the skin daily. Remove & Discard patch within 12 hours or as  directed by MD 30 patch 0   metoprolol tartrate (LOPRESSOR) 25 MG tablet Take 1 tablet (25 mg total) by mouth 2 (two) times daily. 60 tablet 5   rosuvastatin (CRESTOR) 20 MG tablet TAKE 1 TABLET BY MOUTH EVERY DAY (Patient taking differently: Take 20 mg by mouth daily.) 90 tablet 3   sevelamer carbonate (RENVELA) 800 MG tablet Take 1,600 mg by mouth See admin instructions. Take 1,600 mg by mouth with meals (when eating)     traMADol (ULTRAM) 50 MG tablet TAKE 1/2 TABLET BY MOUTH TWICE DAILY AS NEEDED  (Patient taking differently: Take 25 mg by mouth every 12 (twelve) hours as needed for moderate pain.) 23 tablet 1   venlafaxine XR (EFFEXOR-XR) 37.5 MG 24 hr capsule TAKE 1 CAPSULE (37.5 MG TOTAL) BY MOUTH AT BEDTIME. (Patient taking differently: Take 37.5 mg by mouth at bedtime.) 90 capsule 1   albuterol (VENTOLIN HFA) 108 (90 Base) MCG/ACT inhaler TAKE 2 PUFFS BY MOUTH EVERY 6 HOURS AS NEEDED FOR WHEEZE OR SHORTNESS OF BREATH (Patient not taking: Reported on 07/20/2021) 8.5 each 5   folic acid (FOLVITE) 1 MG tablet Take 1 tablet (1 mg total) by mouth daily. (Patient taking differently: Take 1 mg by mouth See admin instructions. Take on dialysis days, Tuesday, Thursday, Saturday) 30 tablet 3   ipratropium-albuterol (DUONEB) 0.5-2.5 (3) MG/3ML SOLN Take 3 mLs by nebulization every 6 (six) hours as needed. (Patient not taking: Reported on 07/29/2021) 360 mL 0   iron sucrose in sodium chloride 0.9 % 100 mL Inject into the vein.     Methoxy PEG-Epoetin Beta (MIRCERA IJ) Mircera     sodium bicarbonate 650 MG tablet Take 2 tablets (1,300 mg total) by mouth 3 (three) times daily. (Patient not taking: No sig reported) 90 tablet 0     ALLERGIES Patient has no active allergies.  MEDICAL HISTORY Past Medical History:  Diagnosis Date   AKI (acute kidney injury) ([place]) 01/2020   Cancer ([place]) 2005   Breast Cancer   COPD (chronic obstructive pulmonary disease) ([place])    Depression    Diabetes mellitus without complication ([place])    Hyperlipidemia      SOCIAL HISTORY Social History   Socioeconomic History   Marital status: Divorced    Spouse name: Not on file   Number of children: 1   Years of education: 12   Highest education level: GED or equivalent  Occupational History   Occupation: Retired  Tobacco Use   Smoking status: Every Day    Packs/day: 1.00    Years: 40.00    Pack years: 40.00    Types: Cigarettes   Smokeless tobacco: Never  Vaping Use   Vaping Use: Never used  Substance and  Sexual Activity   Alcohol use: No   Drug use: No   Sexual activity: Not Currently  Other Topics Concern   Not on file  Social History Narrative   Single, 1 daughter in [place]    on medicare/medicaid, disabled.   Obtained GED    Social Determinants of Health   Financial Resource Strain: Not on file  Food Insecurity: No Food Insecurity   Worried About Charity fundraiser in the Last Year: Never true   Ran Out of Food in the Last Year: Never true  Transportation Needs: No Transportation Needs   Lack of Transportation (Medical): No   Lack of Transportation (Non-Medical): No  Physical Activity: Inactive   Days of Exercise per Week: 0 days   Minutes  of Exercise per Session: 0 min  Stress: Stress Concern Present   Feeling of Stress : To some extent  Social Connections: Moderately Isolated   Frequency of Communication with Friends and Family: More than three times a week   Frequency of Social Gatherings with Friends and Family: More than three times a week   Attends Religious Services: 1 to 4 times per year   Active Member of [employer] or Organizations: No   Attends Music therapist: Never   Marital Status: Divorced  Human resources officer Violence: Not At Risk   Fear of Current or Ex-Partner: No   Emotionally Abused: No   Physically Abused: No   Sexually Abused: No     FAMILY HISTORY Family History  Problem Relation Age of Onset   Arthritis Mother    Alzheimer's disease Mother    Dementia Father    COPD Brother    COPD Brother      Review of Systems: 12 systems were reviewed and negative except per HPI  Physical Exam: Vitals:   08/01/2021 1345 07/24/2021 1400  BP: (!) 157/61 (!) 154/72  Pulse: 100 (!) 101  Resp: 17 (!) 23  Temp:    SpO2: 100% 100%   No intake/output data recorded. No intake or output data in the 24 hours ending 07/19/2021 1438 General: in distress, chronically ill appearing/frail HEENT: anicteric sclera, MMM CV: tachycardic, no m/r/g  appreciated Lungs: intermittent coarse breath sounds, increased wob Abd: soft, non-tender, non-distended Ext: no edema Neuro: awake, alert  Test Results Reviewed Lab Results  Component Value Date   NA 137 07/28/2021   K 7.4 (HH) 07/22/2021   CL 107 08/03/2021   CO2 <7 (L) 07/26/2021   BUN >300 (H) 07/08/2021   CREATININE 14.10 (H) 07/20/2021   GFR 60.18 07/29/2019   CALCIUM 8.0 (L) 08/03/2021   ALBUMIN 3.2 (L) 06/26/2021   PHOS 8.2 (H) 08/02/2021    I have reviewed relevant outside healthcare records

## 2021-07-17 NOTE — Progress Notes (Signed)
2035- Patient returned from dialysis. Was able to complete 1 hour of HD before experiencing seizure like activity per HD nurse. Per HD nurse MD notified and patient will have to get HD at bedside likely tomorrow.

## 2021-07-17 NOTE — Progress Notes (Signed)
Pending COVID result. If negative, then will bring her up to the HD unit. If any delays or if patient cannot come up, then would recommend CRRT in the interim. Discussed w/ primary service and HD unit.  Gean Quint, MD [facility]

## 2021-07-17 NOTE — H&P (Signed)
NAME:  Cindy Burns, MRN:  [id number], DOB:  Mar 04, 1946, LOS: 0 ADMISSION DATE:  07/19/2021, CONSULTATION DATE: 10/11 REFERRING MD:  redwine, CHIEF COMPLAINT:  acute metabolic encephalopathy ESRD severe acidosis    History of Present Illness:  75 year old female who presented to the ER 10/11 w/ CC shortness of breath. Per clinic review had not been to HD since 9/27. Has been progressively more short of breath which was why she presented. In ER found to have several metabolic derangements including: metabolic acidosis, K >1.7 and Ph of 7.07. She was a poor historian. Would follow commands but confused at times. Nephrology was consulted and PCCM asked to admit.   Pertinent  Medical History  ESRD, Non-compliance, COPD, tobacco abuse, diabetes type II, left mid lung nodule   Significant Hospital Events: Including procedures, antibiotic start and stop dates in addition to other pertinent events   10/11 admitted w/ increased WOB, K > 7.5, Ph 7.07 w/ mild encephalopathy. PCCM asked to admit. K treated in ER and Nephro consulted for urgent HD   Interim History / Subjective:  Reports sig shortness of breath   Objective   Blood pressure (Abnormal) 137/56, pulse (Abnormal) 103, temperature 98 F (36.7 C), temperature source Oral, resp. rate (Abnormal) 25, height 5\' 2"  (1.575 m), weight 49.9 kg, SpO2 100 %.       No intake or output data in the 24 hours ending 07/14/2021 1532 Filed Weights   08/03/2021 1146  Weight: 49.9 kg    Examination: General: frail very pungent foul smelling female, currently awake, has some encephalopathy w/ + accessory use  HENT: NCAT does have temporal wasting  Lungs: RR in 30s. Labored. Dec bases Cardiovascular: RRR  Abdomen: not tender  Extremities: warm  Neuro: awake, alert. Follow commands. Can't focus  GU: anuric   Resolved Hospital Problem list     Assessment & Plan:  ESRD w/ Severe Metabolic Acidosis, uremia and life threatening Hyperkalemia 2/2  non-compliance w/ HD -I am concerned that this may be more a social issue/ lack of support/ ability to access her care -treated w/ bicarb/d50/lokema and insulin -seen by neprho Plan Emergent HD; will likely given need 2-3 rounds of HD over next 3 days given degree of metabolic derangement.  Follow up lab work this evening and in AM  Social work consult   Acute respiratory distress 2/2 severe metabolic acidosis. From pulm standpoint there is not much more she can do compensatory wise w/ VCO2 25.  Plan Pulse ox Address metabolic acidosis  Admit to ICU over night   Acute metabolic Encephalopathy 2/2 metabolic acidosis and uremia Plan HD Supportive care Hold sedating meds Send cultures   SIRS / Leukocytosis: suspect reactive BUT has HD cath and very poor hygiene so infection NOT excluded Plan BC X2    H/o tobacco abuse. COPD and Left mid lung pulm nodule w/ h/o breast cancer  Plan CT chest when stable PRN BD Stop smoking   Chronic anemia  Plan Trend   Best Practice (right click and "Reselect all SmartList Selections" daily)   Diet/type: NPO w/ oral meds DVT prophylaxis: SCD GI prophylaxis: N/A Lines: Dialysis Catheter Foley:  N/A Code Status:  full code Last date of multidisciplinary goals of care discussion [pending]  Labs   CBC: Recent Labs  Lab 08/01/2021 1247 07/07/2021 1257  WBC 13.0*  --   NEUTROABS 12.3*  --   HGB 10.7* 10.5*  HCT 32.8* 31.0*  MCV 102.8*  --  PLT 184  --     Basic Metabolic Panel: Recent Labs  Lab 07/11/2021 1247 08/05/2021 1257  NA 138 137  K >7.5* 7.4*  CL 107  --   CO2 <7*  --   GLUCOSE 147*  --   BUN >300*  --   CREATININE 14.10*  --   CALCIUM 8.0*  --   MG 2.2  --   PHOS 8.2*  --    GFR: Estimated Creatinine Clearance: 2.8 mL/min (A) (by C-G formula based on SCr of 14.1 mg/dL (H)). Recent Labs  Lab 07/16/2021 1247  WBC 13.0*    Liver Function Tests: No results for input(s): AST, ALT, ALKPHOS, BILITOT, PROT, ALBUMIN  in the last 168 hours. No results for input(s): LIPASE, AMYLASE in the last 168 hours. No results for input(s): AMMONIA in the last 168 hours.  ABG    Component Value Date/Time   PHART 7.361 02/16/2020 1147   PCO2ART 25.3 (L) 02/16/2020 1147   PO2ART 84.8 02/16/2020 1147   HCO3 5.3 (L) 08/06/2021 1257   TCO2 6 (L) 07/23/2021 1257   ACIDBASEDEF 23.0 (H) 07/08/2021 1257   O2SAT 87.0 07/27/2021 1257     Coagulation Profile: No results for input(s): INR, PROTIME in the last 168 hours.  Cardiac Enzymes: No results for input(s): CKTOTAL, CKMB, CKMBINDEX, TROPONINI in the last 168 hours.  HbA1C: Hemoglobin A1C  Date/Time Value Ref Range Status  12/08/2020 04:12 PM 4.7 4.0 - 5.6 % Final   Hgb A1c MFr Bld  Date/Time Value Ref Range Status  09/06/2020 03:18 AM 4.4 (L) 4.8 - 5.6 % Final    Comment:    (NOTE) Pre diabetes:          5.7%-6.4%  Diabetes:              >6.4%  Glycemic control for   <7.0% adults with diabetes   06/16/2020 04:21 PM 4.1 <5.7 % of total Hgb Final    Comment:    For the purpose of screening for the presence of diabetes: . <5.7%       Consistent with the absence of diabetes 5.7-6.4%    Consistent with increased risk for diabetes             (prediabetes) > or =6.5%  Consistent with diabetes . This assay result is consistent with a decreased risk of diabetes. . Currently, no consensus exists regarding use of hemoglobin A1c for diagnosis of diabetes in children. . According to American Diabetes Association (ADA) guidelines, hemoglobin A1c <7.0% represents optimal control in non-pregnant diabetic patients. Different metrics may apply to specific patient populations.  Standards of Medical Care in Diabetes(ADA). .     CBG: No results for input(s): GLUCAP in the last 168 hours.  Review of Systems:   Not able   Past Medical History:  She,  has a past medical history of AKI (acute kidney injury) ([place]) (01/2020), Cancer ([place]) (2005), COPD  (chronic obstructive pulmonary disease) ([place]), Depression, Diabetes mellitus without complication ([place]), and Hyperlipidemia.   Surgical History:   Past Surgical History:  Procedure Laterality Date   BREAST SURGERY  2005   CATARACT EXTRACTION     CHOLECYSTECTOMY  2013   IR FLUORO GUIDE CV LINE RIGHT  02/01/2020   IR US GUIDE VASC ACCESS RIGHT  02/01/2020     Social History:   reports that she has been smoking cigarettes. She has a 40.00 pack-year smoking history. She has never used smokeless tobacco. She reports that she  does not drink alcohol and does not use drugs.   Family History:  Her family history includes Alzheimer's disease in her mother; Arthritis in her mother; COPD in her brother and brother; Dementia in her father.   Allergies No Active Allergies   Home Medications  Prior to Admission medications   Medication Sig Start Date End Date Taking? Authorizing Provider  amLODipine (NORVASC) 10 MG tablet Take 1 tablet (10 mg total) by mouth daily. 02/20/20 04/29/29 Yes Alma Friendly, MD  gabapentin (NEURONTIN) 300 MG capsule TAKE 1 CAPSULE BY MOUTH AFTER DIALYSIS ON DIALYSIS ON DAYS Patient taking differently: Take 300 mg by mouth See admin instructions. After dialysis Tuesday,Thursday,saturday 01/18/21  Yes Bedsole, Amy E, MD  JANUVIA 25 MG tablet TAKE 1 TABLET BY MOUTH EVERY DAY Patient taking differently: Take 25 mg by mouth daily. 03/29/21  Yes Bedsole, Amy E, MD  lidocaine (LIDODERM) 5 % Place 1 patch onto the skin daily. Remove & Discard patch within 12 hours or as directed by MD 06/11/21  Yes Sponseller, Eugene Garnet R, PA-C  metoprolol tartrate (LOPRESSOR) 25 MG tablet Take 1 tablet (25 mg total) by mouth 2 (two) times daily. 02/05/21 07/15/2021 Yes Bedsole, Amy E, MD  rosuvastatin (CRESTOR) 20 MG tablet TAKE 1 TABLET BY MOUTH EVERY DAY Patient taking differently: Take 20 mg by mouth daily. 07/27/20  Yes Bedsole, Amy E, MD  sevelamer carbonate (RENVELA) 800 MG tablet Take 1,600 mg  by mouth See admin instructions. Take 1,600 mg by mouth with meals (when eating)   Yes [provider]  traMADol (ULTRAM) 50 MG tablet TAKE 1/2 TABLET BY MOUTH TWICE DAILY AS NEEDED Patient taking differently: Take 25 mg by mouth every 12 (twelve) hours as needed for moderate pain. 04/18/21  Yes Bedsole, Amy E, MD  venlafaxine XR (EFFEXOR-XR) 37.5 MG 24 hr capsule TAKE 1 CAPSULE (37.5 MG TOTAL) BY MOUTH AT BEDTIME. Patient taking differently: Take 37.5 mg by mouth at bedtime. 04/18/21  Yes Bedsole, Amy E, MD  albuterol (VENTOLIN HFA) 108 (90 Base) MCG/ACT inhaler TAKE 2 PUFFS BY MOUTH EVERY 6 HOURS AS NEEDED FOR WHEEZE OR SHORTNESS OF BREATH Patient not taking: Reported on 07/23/2021 05/31/21   [name], MD  folic acid (FOLVITE) 1 MG tablet Take 1 tablet (1 mg total) by mouth daily. Patient taking differently: Take 1 mg by mouth See admin instructions. Take on dialysis days, Tuesday, Thursday, Saturday 08/17/20 08/17/21  Debbe Odea, MD  ipratropium-albuterol (DUONEB) 0.5-2.5 (3) MG/3ML SOLN Take 3 mLs by nebulization every 6 (six) hours as needed. Patient not taking: Reported on 08/04/2021 02/19/20 04/29/29  Alma Friendly, MD  iron sucrose in sodium chloride 0.9 % 100 mL Inject into the vein. 10/19/20 10/18/21  [provider]  Methoxy PEG-Epoetin Beta (MIRCERA IJ) Mircera 11/07/20 11/06/21  [provider]  sodium bicarbonate 650 MG tablet Take 2 tablets (1,300 mg total) by mouth 3 (three) times daily. Patient not taking: No sig reported 10/25/20   Debbe Odea, MD     Critical care time: 58 min    Erick Colace ACNP-BC [place] Pager # [phone] OR # [phone] if no answer

## 2021-07-17 NOTE — ED Notes (Signed)
Patient requested bed pan, but was noted to have already soiled self. Patient cleaned with new linen and gown placed.

## 2021-07-18 DIAGNOSIS — N186 End stage renal disease: Secondary | ICD-10-CM | POA: Diagnosis not present

## 2021-07-18 LAB — BASIC METABOLIC PANEL
Anion gap: 26 — ABNORMAL HIGH (ref 5–15)
Anion gap: 24 — ABNORMAL HIGH (ref 5–15)
Anion gap: 23 — ABNORMAL HIGH (ref 5–15)
BUN: 217 mg/dL — ABNORMAL HIGH (ref 8–23)
BUN: 225 mg/dL — ABNORMAL HIGH (ref 8–23)
BUN: 224 mg/dL — ABNORMAL HIGH (ref 8–23)
CO2: 11 mmol/L — ABNORMAL LOW (ref 22–32)
CO2: 15 mmol/L — ABNORMAL LOW (ref 22–32)
CO2: 15 mmol/L — ABNORMAL LOW (ref 22–32)
Calcium: 7.8 mg/dL — ABNORMAL LOW (ref 8.9–10.3)
Calcium: 7.7 mg/dL — ABNORMAL LOW (ref 8.9–10.3)
Calcium: 7.4 mg/dL — ABNORMAL LOW (ref 8.9–10.3)
Chloride: 103 mmol/L (ref 98–111)
Chloride: 104 mmol/L (ref 98–111)
Chloride: 102 mmol/L (ref 98–111)
Creatinine, Ser: 9.91 mg/dL — ABNORMAL HIGH (ref 0.44–1.00)
Creatinine, Ser: 10.5 mg/dL — ABNORMAL HIGH (ref 0.44–1.00)
Creatinine, Ser: 11.09 mg/dL — ABNORMAL HIGH (ref 0.44–1.00)
GFR, Estimated: 4 mL/min — ABNORMAL LOW (ref >60)
GFR, Estimated: 4 mL/min — ABNORMAL LOW (ref >60)
GFR, Estimated: 3 mL/min — ABNORMAL LOW (ref >60)
Glucose, Bld: 138 mg/dL — ABNORMAL HIGH (ref 70–99)
Glucose, Bld: 100 mg/dL — ABNORMAL HIGH (ref 70–99)
Glucose, Bld: 132 mg/dL — ABNORMAL HIGH (ref 70–99)
Potassium: 3.6 mmol/L (ref 3.5–5.1)
Potassium: 4.2 mmol/L (ref 3.5–5.1)
Potassium: 4.7 mmol/L (ref 3.5–5.1)
Sodium: 140 mmol/L (ref 135–145)
Sodium: 143 mmol/L (ref 135–145)
Sodium: 140 mmol/L (ref 135–145)

## 2021-07-18 LAB — POCT I-STAT 7, (LYTES, BLD GAS, ICA,H+H)
Acid-base deficit: 9 mmol/L — ABNORMAL HIGH (ref 0.0–2.0)
Bicarbonate: 14.3 mmol/L — ABNORMAL LOW (ref 20.0–28.0)
Calcium, Ion: 0.92 mmol/L — ABNORMAL LOW (ref 1.15–1.40)
HCT: 23 % — ABNORMAL LOW (ref 36.0–46.0)
Hemoglobin: 7.8 g/dL — ABNORMAL LOW (ref 12.0–15.0)
O2 Saturation: 98 %
Patient temperature: 99
Potassium: 4.2 mmol/L (ref 3.5–5.1)
Sodium: 141 mmol/L (ref 135–145)
TCO2: 15 mmol/L — ABNORMAL LOW (ref 22–32)
pCO2 arterial: 23.7 mmHg — ABNORMAL LOW (ref 32.0–48.0)
pH, Arterial: 7.39 (ref 7.350–7.450)
pO2, Arterial: 103 mmHg (ref 83.0–108.0)

## 2021-07-18 LAB — CBC
HCT: 27.6 % — ABNORMAL LOW (ref 36.0–46.0)
Hemoglobin: 9.4 g/dL — ABNORMAL LOW (ref 12.0–15.0)
MCH: 32.4 pg (ref 26.0–34.0)
MCHC: 34.1 g/dL (ref 30.0–36.0)
MCV: 95.2 fL (ref 80.0–100.0)
Platelets: 147 10*3/uL — ABNORMAL LOW (ref 150–400)
RBC: 2.9 MIL/uL — ABNORMAL LOW (ref 3.87–5.11)
RDW: 13 % (ref 11.5–15.5)
WBC: 8.5 10*3/uL (ref 4.0–10.5)
nRBC: 0 % (ref 0.0–0.2)

## 2021-07-18 LAB — LACTIC ACID, PLASMA: Lactic Acid, Venous: 3.7 mmol/L (ref 0.5–1.9)

## 2021-07-18 LAB — GLUCOSE, CAPILLARY
Glucose-Capillary: 100 mg/dL — ABNORMAL HIGH (ref 70–99)
Glucose-Capillary: 112 mg/dL — ABNORMAL HIGH (ref 70–99)
Glucose-Capillary: 115 mg/dL — ABNORMAL HIGH (ref 70–99)
Glucose-Capillary: 134 mg/dL — ABNORMAL HIGH (ref 70–99)
Glucose-Capillary: 80 mg/dL (ref 70–99)
Glucose-Capillary: 89 mg/dL (ref 70–99)

## 2021-07-18 LAB — PHOSPHORUS: Phosphorus: 6.9 mg/dL — ABNORMAL HIGH (ref 2.5–4.6)

## 2021-07-18 MED ORDER — LORAZEPAM 2 MG/ML IJ SOLN: 1.0000 mg | Freq: Once | INTRAMUSCULAR | Status: DC | PRN

## 2021-07-18 MED ORDER — SODIUM CHLORIDE 0.9% FLUSH: 10.0000 mL | INTRAVENOUS | Status: DC | PRN

## 2021-07-18 MED ORDER — SEVELAMER CARBONATE 800 MG PO TABS
1600.0000 mg | ORAL_TABLET | Freq: Three times a day (TID) | ORAL | Status: DC
  Filled 2021-07-18: qty 2

## 2021-07-18 MED ORDER — DARBEPOETIN ALFA 60 MCG/0.3ML IJ SOSY: 60.0000 ug | PREFILLED_SYRINGE | INTRAMUSCULAR | Status: DC

## 2021-07-18 MED ORDER — CALCIUM GLUCONATE-NACL 2-0.675 GM/100ML-% IV SOLN
2.0000 g | Freq: Once | INTRAVENOUS | Status: AC
  Administered 2021-07-19: 2000 mg via INTRAVENOUS
  Filled 2021-07-18: qty 100

## 2021-07-18 NOTE — Progress Notes (Signed)
Contacted [facility] is scheduled to receive out-pt HD on TTS with arrival time of 12:20 and chair time of 12:30. Will follow and assist as needed.  Melven Sartorius Renal Navigator  [phone]

## 2021-07-18 NOTE — Progress Notes (Signed)
NAME:  Cindy Burns, MRN:  [id number], DOB:  [birth year], LOS: 1 ADMISSION DATE:  07/24/2021, CONSULTATION DATE:  08/01/2021 REFERRING MD:  Cindy Burns, CHIEF COMPLAINT:  Acute Uremic Encephalopathy w/ AGMA   Brief History   75 year old female who presented to the ER 10/11 w/ CC shortness of breath. Per clinic review had not been to HD since 9/27. Has been progressively more short of breath which was why she presented. In ER found to have several metabolic derangements including: metabolic acidosis, K >8.4 and Ph of 7.07. She was a poor historian. Would follow commands but confused at times. Nephrology was consulted and PCCM asked to admit.   Past Medical History  ESRD COPD Tobacco use disorder T2DM Lt, mid lung nodule Depression  Significant Hospital Events   10/11 Admitted w/ increased WOB, K > 7.5, Ph 7.07 w/ mild encephalopathy. PCCM asked to admit. K treated in ER and Nephro consulted for urgent HD  Had 1 hr of HD overnight with seizure-like activity.   Consults:  Nephrology  Procedures:  none  Significant Diagnostic Tests:   DG Chest Portable 1 View Result Date: 07/19/2021 FINDINGS: A right-sided dialysis catheter is in stable position. The cardiomediastinal silhouette is stable. There is focal opacity projecting over the left midlung measuring 1.4 cm which is similar compared to the most recent prior study of 06/26/2021 but is increased in conspicuity compared to more remote prior studies. There is no focal consolidation. There is no evidence of pulmonary edema. There is no significant pleural effusion. There is no pneumothorax. There is no acute osseous abnormality.  IMPRESSION: 1. No evidence of pulmonary edema. 2. Focal opacity projecting over the left midlung measuring 1.4 cm, unchanged compared to the most recent prior study but increased in conspicuity compared to more remote studies. Recommend characterization with nonemergent chest CT.    Micro Data:  10/11 COVID PCR  negative 10/1 Bcx pending   Antimicrobials:  none    Interim History/Subjective:  Overnight events: 2 HD yesterday evening and within the first hour patient had seizure-like activity around 9:30 PM. Based on nephrology's documentation, the patient was reevaluated once he returned to 75M.  She was apparently awake/alert and oriented without evidence of postictal state.  Unsure if the patient had stigmata of seizure such as tongue biting or urinary incontinence.  BMP was reassessed overnight with significant anion gap of 26 and potassium of 3.6.  Patient resting in bed.  She is alert but disoriented to time, place, and event but is redirectable.  She is tachypnic but appears comfortable and denies any acute complaints.   Objective:  Blood pressure (!) 138/59, pulse (!) 105, temperature 97.6 F (36.4 C), temperature source Oral, resp. rate 20, height 5\' 2"  (1.575 m), weight 48.8 kg, SpO2 100 %.        Intake/Output Summary (Last 24 hours) at 07/18/2021 0618 Last data filed at 08/02/2021 2025 Gross per 24 hour  Intake --  Output -284 ml  Net 284 ml   Filed Weights   07/13/2021 1909 07/20/2021 2025 07/18/21 0500  Weight: 49.8 kg 49.8 kg 48.8 kg    Examination: General: alert but disoriented HENT: PERRLA with normal extraocular motion Lungs: Inspiratory wheezing and upper lung fields with tachypnea, no accessory muscle usage  Cardiovascular: Regular rate and rhythm no murmurs rubs or gallops Abdomen: Soft nondistended Extremities: Warm with palpable pulses in distal extremities Neuro: Mild tremor and minimally hyperreflexive GU: N/AA  Patient Lines/Drains/Airways Status  Active Line/Drains/Airways     Name Placement date Placement time Site Days   Peripheral IV 07/08/2021 22 G Left Forearm 07/19/2021  1449  Forearm  1   Hemodialysis Catheter Right Internal jugular Double lumen Permanent (Tunneled) 02/01/20  1805  Internal jugular  533   External Urinary Catheter 07/24/2021  2100  --   1   Pressure Injury 01/09/20 Heel Left Deep Tissue Pressure Injury - Purple or maroon localized area of discolored intact skin or blood-filled blister due to damage of underlying soft tissue from pressure and/or shear. purplr color with red circle around it 01/09/20  1000  -- 556   Pressure Injury 02/06/20 Coccyx Mid Stage 1 -  Intact skin with non-blanchable redness of a localized area usually over a bony prominence. 02/06/20  1950  -- 528   Pressure Injury 08/16/20 Coccyx Medial Stage 1 -  Intact skin with non-blanchable redness of a localized area usually over a bony prominence. 08/16/20  1239  -- 336   Wound / Incision (Open or Dehisced) 01/09/20 Toe (Comment  which one) Left dark, hard with redness 01/09/20  1000  Toe (Comment  which one)  [facility] Problem list   none  Assessment & Plan:  Cindy Burns is a 75 y.o. with a pertinent PMH of ESRD, COPD, T2DM, who presented with [facility] in the setting of missed HD sessions and admitted for acute uremic encephalopathy complicated by severe hyperkalemia.   Acute Uremic Encephalopathy: Severe Hyperkalemia - resolved ESRD (HD adherence issues) Possible Dialysis Disequilibrium Syndrome versus Uremia Patient presents to [facility] after weeks of not receiving hemodialysis.  She presents with uremic encephalopathy, anion gap metabolic acidosis, and therapy here hyperkalemia.  Had 1 episode of emergent hemodialysis overnight.  In the first hour of HD, he developed altered mental status with increased somnolence, confusion, and disorientation.  He apparently had seizure-like activity without significant postictal state.  Repeat labs drawn overnight showed resolution of his hyperkalemia but continues to have a significant AGMA.  Morning labs pending.  Patient is not currently on sodium bicarb supplementation.  -We will likely need repeat HD versus CRRT this morning -Pending metabolic panel this morning -We will likely need sodium  bicarb supplementation -Seizure/aspiration precautions - PRN ativan for seizure-like activity  Goals of Care: Patient has expressed that she would like to discontinue HD.  - Palliative Care consult   Acute respiratory distress 2/2 severe metabolic acidosis.  Patient presented with a significant anion gap metabolic acidosis.  ABG was performed showing pH of 7.07, PCO2 of 18.3, and a bicarb of less than 7. -Repeat ABG this morning  -Supplemental oxygen as needed  Leukocytosis: Has mild leukocytosis of 11.3.  She is tachycardic and tachypneic in the setting of severe anion gap metabolic acidosis.  This is likely all secondary to uremia, but patient's lactic acid is mildly elevated at 3.7.  Pro-Cal negative. difficult to rule out infectious etiology.  Blood cultures are pending.  No obvious source of infection, and therefore no need for antibiotic therapy at this time.  - Bcx x2 pending - Trend WBC   H/o tobacco abuse. COPD and Left mid lung pulm nodule w/ h/o breast cancer  CT chest when stable PRN BD Smoking cessation counseling needed   T2DM: - SSI and CBG monitoring   Chronic anemia  Plan Trend   Best Practice:  Diet: NPO needs swallow evaluation Pain/Anxiety/Delirium protocol (if indicated): n/a VAP protocol (  if indicated): n/a DVT prophylaxis: SCD GI prophylaxis: N/A Glucose control: SSI Lines: Lines: N/A Foley: Foley:  N/A Mobility: bed rest Code Status: full code Family Communication: pending Disposition: ICU  Labs   CBC: Recent Labs  Lab 07/08/2021 1247 07/20/2021 1257 08/04/2021 1804  WBC 13.0*  --  11.3*  NEUTROABS 12.3*  --   --   HGB 10.7* 10.5* 9.2*  HCT 32.8* 31.0* 27.5*  MCV 102.8*  --  98.9  PLT 184  --  761    Basic Metabolic Panel: Recent Labs  Lab 08/05/2021 1247 07/24/2021 1257 08/04/2021 1804 08/03/2021 1839 07/10/2021 2315  NA 138 137 142 141 140  K >7.5* 7.4* 5.0 5.4* 3.6  CL 107  --  104 104 103  CO2 <7*  --  9* <7* 11*  GLUCOSE 147*  --   224* 260* 138*  BUN >300*  --  >300* >300* 217*  CREATININE 14.10*  --  14.31* 14.28* 9.91*  CALCIUM 8.0*  --  8.2* 8.1* 7.8*  MG 2.2  --   --   --   --   PHOS 8.2*  --   --  7.7*  --    GFR: Estimated Creatinine Clearance: 4 mL/min (A) (by C-G formula based on SCr of 9.91 mg/dL (H)). Recent Labs  Lab 07/22/2021 1247 07/31/2021 1804 07/24/2021 1839 08/02/2021 2315  PROCALCITON  --  0.26  --   --   WBC 13.0* 11.3*  --   --   LATICACIDVEN  --   --  3.6* 3.7*    Liver Function Tests: Recent Labs  Lab 07/23/2021 1839  ALBUMIN 3.5   No results for input(s): LIPASE, AMYLASE in the last 168 hours. No results for input(s): AMMONIA in the last 168 hours.  ABG    Component Value Date/Time   PHART 7.361 02/16/2020 1147   PCO2ART 25.3 (L) 02/16/2020 1147   PO2ART 84.8 02/16/2020 1147   HCO3 5.3 (L) 07/24/2021 1257   TCO2 6 (L) 08/05/2021 1257   ACIDBASEDEF 23.0 (H) 07/22/2021 1257   O2SAT 87.0 07/20/2021 1257     Coagulation Profile: Recent Labs  Lab 08/06/2021 1804  INR 1.2    Cardiac Enzymes: No results for input(s): CKTOTAL, CKMB, CKMBINDEX, TROPONINI in the last 168 hours.  HbA1C: Hemoglobin A1C  Date/Time Value Ref Range Status  12/08/2020 04:12 PM 4.7 4.0 - 5.6 % Final   Hgb A1c MFr Bld  Date/Time Value Ref Range Status  07/24/2021 04:05 PM 5.8 (H) 4.8 - 5.6 % Final    Comment:    (NOTE) Pre diabetes:          5.7%-6.4%  Diabetes:              >6.4%  Glycemic control for   <7.0% adults with diabetes   09/06/2020 03:18 AM 4.4 (L) 4.8 - 5.6 % Final    Comment:    (NOTE) Pre diabetes:          5.7%-6.4%  Diabetes:              >6.4%  Glycemic control for   <7.0% adults with diabetes     CBG: Recent Labs  Lab 07/24/2021 1756 07/23/2021 2040 07/23/2021 2329 07/18/21 0339  GLUCAP 263* 162* 129* 100*    Review of Systems:   See above  Past Medical History  She,  has a past medical history of AKI (acute kidney injury) ([place]) (01/2020), Cancer ([place]) (2005),  COPD (chronic obstructive pulmonary disease) ([place]),  Depression, Diabetes mellitus without complication ([place]), and Hyperlipidemia.   Surgical History    Past Surgical History:  Procedure Laterality Date   BREAST SURGERY  2005   CATARACT EXTRACTION     CHOLECYSTECTOMY  2013   IR FLUORO GUIDE CV LINE RIGHT  02/01/2020   IR US GUIDE VASC ACCESS RIGHT  02/01/2020     Social History   reports that she has been smoking cigarettes. She has a 40.00 pack-year smoking history. She has never used smokeless tobacco. She reports that she does not drink alcohol and does not use drugs.   Family History   Her family history includes Alzheimer's disease in her mother; Arthritis in her mother; COPD in her brother and brother; Dementia in her father.   Allergies No Active Allergies   Home Medications  Prior to Admission medications   Medication Sig Start Date End Date Taking? Authorizing Provider  amLODipine (NORVASC) 10 MG tablet Take 1 tablet (10 mg total) by mouth daily. 02/20/20 04/29/29 Yes Alma Friendly, MD  gabapentin (NEURONTIN) 300 MG capsule TAKE 1 CAPSULE BY MOUTH AFTER DIALYSIS ON DIALYSIS ON DAYS Patient taking differently: Take 300 mg by mouth See admin instructions. After dialysis Tuesday,Thursday,saturday 01/18/21  Yes Bedsole, Amy E, MD  JANUVIA 25 MG tablet TAKE 1 TABLET BY MOUTH EVERY DAY Patient taking differently: Take 25 mg by mouth daily. 03/29/21  Yes Bedsole, Amy E, MD  lidocaine (LIDODERM) 5 % Place 1 patch onto the skin daily. Remove & Discard patch within 12 hours or as directed by MD 06/11/21  Yes Sponseller, Eugene Garnet R, PA-C  metoprolol tartrate (LOPRESSOR) 25 MG tablet Take 1 tablet (25 mg total) by mouth 2 (two) times daily. 02/05/21 07/12/2021 Yes Bedsole, Amy E, MD  rosuvastatin (CRESTOR) 20 MG tablet TAKE 1 TABLET BY MOUTH EVERY DAY Patient taking differently: Take 20 mg by mouth daily. 07/27/20  Yes Bedsole, Amy E, MD  sevelamer carbonate (RENVELA) 800 MG tablet Take 1,600  mg by mouth See admin instructions. Take 1,600 mg by mouth with meals (when eating)   Yes [provider]  traMADol (ULTRAM) 50 MG tablet TAKE 1/2 TABLET BY MOUTH TWICE DAILY AS NEEDED Patient taking differently: Take 25 mg by mouth every 12 (twelve) hours as needed for moderate pain. 04/18/21  Yes Bedsole, Amy E, MD  venlafaxine XR (EFFEXOR-XR) 37.5 MG 24 hr capsule TAKE 1 CAPSULE (37.5 MG TOTAL) BY MOUTH AT BEDTIME. Patient taking differently: Take 37.5 mg by mouth at bedtime. 04/18/21  Yes Bedsole, Amy E, MD  albuterol (VENTOLIN HFA) 108 (90 Base) MCG/ACT inhaler TAKE 2 PUFFS BY MOUTH EVERY 6 HOURS AS NEEDED FOR WHEEZE OR SHORTNESS OF BREATH Patient not taking: Reported on 07/19/2021 05/31/21   [name], MD  folic acid (FOLVITE) 1 MG tablet Take 1 tablet (1 mg total) by mouth daily. Patient taking differently: Take 1 mg by mouth See admin instructions. Take on dialysis days, Tuesday, Thursday, Saturday 08/17/20 08/17/21  Debbe Odea, MD  ipratropium-albuterol (DUONEB) 0.5-2.5 (3) MG/3ML SOLN Take 3 mLs by nebulization every 6 (six) hours as needed. Patient not taking: Reported on 07/12/2021 02/19/20 04/29/29  Alma Friendly, MD  iron sucrose in sodium chloride 0.9 % 100 mL Inject into the vein. 10/19/20 10/18/21  [provider]  Methoxy PEG-Epoetin Beta (MIRCERA IJ) Mircera 11/07/20 11/06/21  [provider]  sodium bicarbonate 650 MG tablet Take 2 tablets (1,300 mg total) by mouth 3 (three) times daily. Patient not taking: No sig  reported 10/25/20   Debbe Odea, MD     Critical care time: n/a    Lawerance Cruel, D.O.  Internal Medicine Resident, PGY-3 [facility]  Pager: [phone] 6:18 AM, 07/18/2021

## 2021-07-18 NOTE — Progress Notes (Addendum)
[facility] PCCM  Stable throughout day Planning HD soon On room air, vitals stable Will place order for progressive care bed once she has had hemodialysis The patient would like to speak to palliative medicine. I spoke to her about her code status.  She says that she doesn't want CPR or a ventilator. Will write order for DNR  Roselie Awkward, MD [place] [facility] Pager: [phone] Cell: [phone] After 7:00 pm call Elink  [phone]

## 2021-07-18 NOTE — Progress Notes (Signed)
Arrived to patient's room. Patient refused assessment or attempt for PIV placement. Notified nurse to reconsult if patient agreeable later. VU. Fran Lowes, RN VAST

## 2021-07-18 NOTE — TOC Initial Note (Signed)
Transition of Care [facility]) - Initial/Assessment Note    Patient Details  Name: Cindy Burns MRN: [id number] Date of Birth: 1945/11/18  Transition of Care [facility]) CM/SW Contact:    Tom-Johnson, Renea Ee, RN Phone Number: 07/18/2021, 5:06 PM  Clinical Narrative:                 CM spoke with patient at bedside. Presented to the ED with complaint of shortness of breath. States she lives at home alone. Does not have children and no siblings. Has friends whom are supportive and involved in her care. Independent with care prior to hospitalization. Uses a cane at home as needed. States she drives self to and from dialysis on TTS at [facility]. CM asked patient if she would like transportation arranged for her and patient states "No". Patient states she does not want to do dialysis anymore that family talked her into it. CM told patient to talk with MD about her decision. Has a PCP and Medically Insured by [employer]. No recommendations noted at this time. CM will continue to follow with needs.    Barriers to Discharge: Continued Medical Work up   Patient Goals and CMS Choice Patient states their goals for this hospitalization and ongoing recovery are:: To go home CMS Medicare.gov Compare Post Acute Care list provided to:: Patient    Expected Discharge Plan and Services     Discharge Planning Services: CM Consult   Living arrangements for the past 2 months: Single Family Home                                      Prior Living Arrangements/Services Living arrangements for the past 2 months: Single Family Home Lives with:: Self Patient language and need for interpreter reviewed:: Yes Do you feel safe going back to the place where you live?: Yes      Need for Family Participation in Patient Care: Yes (Comment) Care giver support system in place?: Yes (comment) Current home services: DME Kasandra Knudsen) Criminal Activity/Legal Involvement Pertinent  to Current Situation/Hospitalization: No - Comment as needed  Activities of Daily Living      Permission Sought/Granted Permission sought to share information with : Case Manager, Family Supports Permission granted to share information with : Yes, Verbal Permission Granted              Emotional Assessment Appearance:: Appears stated age Attitude/Demeanor/Rapport: Engaged Affect (typically observed): Accepting, Hopeful, Calm, Appropriate Orientation: : Oriented to Self, Oriented to Place, Oriented to  Time, Oriented to Situation Alcohol / Substance Use: Not Applicable Psych Involvement: No (comment)  Admission diagnosis:  ESRD (end stage renal disease) ([place]) [N18.6] Patient Active Problem List   Diagnosis Date Noted   Physical deconditioning 02/07/2021   Primary osteoarthritis of left knee 12/08/2020   Type 2 diabetes with nephropathy ([place]) 10/27/2020   Hyperkalemia 10/22/2020   Bilateral leg pain 08/25/2020   Folate deficiency 08/17/2020   Closed traumatic minimally displaced fracture of two ribs 08/15/2020   ESRD (end stage renal disease) ([place]) 04/30/2020   Hypocalcemia 01/19/2020   Encounter for central line placement    Hepatomegaly 07/30/2019   B12 deficiency 04/06/2019   Balance problem 03/18/2019   Ventral hernia 04/03/2017   Hypertension associated with diabetes ([place]) 02/28/2017   Chronic insomnia 10/26/2013   Diabetic autonomic neuropathy ([place]) 08/12/2013   PULMONARY NODULE 09/27/2009   ADENOCARCINOMA, BREAST  02/28/2009   Normocytic anemia 06/24/2008   Moderate COPD (chronic obstructive pulmonary disease) ([place]) 03/14/2008   Major depressive disorder, recurrent episode, moderate ([place]) 08/11/2007   Hyperlipidemia associated with type 2 diabetes mellitus ([place]) 04/30/2007   CIGARETTE SMOKER 04/22/2007   PCP:  [name], MD Pharmacy:   [facility], [place], [place] [address], [place]  [address] Phone: [phone] Fax: [phone]  [facility] - [place], [place] - [address] [employer], [address] [place] [address] Phone: [phone] Fax: [phone]  [id number] - [place], [place] [address] [place] [address] Phone: [phone] Fax: [phone]     Social Determinants of Health (SDOH) Interventions    Readmission Risk Interventions Readmission Risk Prevention Plan 10/25/2020 08/17/2020  Transportation Screening Complete Complete  PCP or Specialist Appt within 3-5 Days - Not Complete  Not Complete comments - First available appt with PCP 11/19  [place] or [place] - Complete  Social Work Consult for [place] Planning/Counseling - Complete  Palliative Care Screening - Not Applicable  Medication Review Press photographer) Complete -  [place] or Home Care Consult Complete -  SW Recovery Care/Counseling Consult Complete -  Palliative Care Screening Not Applicable -  [place] Not Applicable -  Some recent data might be hidden

## 2021-07-18 NOTE — Progress Notes (Signed)
[facility] Progress Note    Assessment/ Plan:   # ESRD:  Outpatient orders: MWF, [place], 3 hours 15 minutes, 2K, 2 Cal, 400/autoflow 1.5, EDW 47.5 kg.  Mircera 50 mcg every 2 weeks, hectorol  -reattempt HD again, utilizing lower flow rates. Tentatively planning for HD tomorrow vs Fri (this is dependent on much her BUN drops) -palliative care consult due to her wish of not wanting to do HD  Seizure like activity -on 10/11. Secondary to uremia vs dialysis dysequilibrium syndrome? Unlikely DDS given slow flow rates and she is neurologically doing better. She does remember the event which also makes seizure less likely. W/u per primary   # Dyspnea -likely related to COPD, acidemia (more likely), volume? Nonetheless, will UF as tolerated. Improving   # Metabolic acidosis/acidemia -likely secondary to lack of HD, HD as above, consider bicarb gtt and ICU evaluation given respiratory status -improving with HD   #Hyperkalemia -s/p shifting agents, lokelma ordered for today, HD as above. Improved   # Volume/ hypertension: EDW 47.5kg. Will UF as tolerated   # Anemia of Chronic Kidney Disease: Hemoglobin 10.7. Receives mircera 50 mcg q2wks (last dose 9/27). Giving esa 10/12   # Secondary Hyperparathyroidism/Hyperphosphatemia: resume home binders, resume hectorol    # Vascular access: R[facility]   # Additional recommendations: - Dose all meds for creatinine clearance < 10 ml/min  - Unless absolutely necessary, no MRIs with gadolinium.  - Implement save arm precautions.  Prefer needle sticks in the dorsum of the hands or wrists.  No blood pressure measurements in arm. - If blood transfusion is requested during hemodialysis sessions, please alert [place] prior to the session.  - If a hemodialysis catheter line culture is requested, please alert [place] as only hemodialysis nurses are able to collect those specimens.    Recommendations were discussed with the primary team.   Gean Quint,  MD  [facility]  Subjective:   Seizure like activity 1 hr into HD yesterday. Labs improving, BUN down from >300 to 217. She reports that she remembers the event of shaking. When discussing about pattern of noncompliance/nonadherence on HD, she does admit that she does not want to do dialysis at all. When we discuss the reasons she is continuing dialysis, she does report that her family wants to continue. She agrees to discuss with palliative care about goals of care and discussing stopping dialysis if possible.   Objective:   BP (!) 138/59   Pulse (!) 105   Temp 99 F (37.2 C) (Oral)   Resp 20   Ht 5\' 2"  (1.575 m)   Wt 48.8 kg   SpO2 100%   BMI 19.68 kg/m   Intake/Output Summary (Last 24 hours) at 07/18/2021 8469 Last data filed at 07/16/2021 2025 Gross per 24 hour  Intake --  Output -284 ml  Net 284 ml   Weight change:   Physical Exam: GEX:[id number] ill appearing CVS:rrr Resp:less tachypneic, cta bl, on [place] UUV:OZDG Ext:no edema Neuro: awake, alert HD access: [facility]  Imaging: DG Chest Portable 1 View  Result Date: 07/23/2021 CLINICAL DATA:  Shortness of breath, missed dialysis EXAM: PORTABLE CHEST 1 VIEW COMPARISON:  Chest radiograph 06/26/2021 FINDINGS: A right-sided dialysis catheter is in stable position. The cardiomediastinal silhouette is stable. There is focal opacity projecting over the left midlung measuring 1.4 cm which is similar compared to the most recent prior study of 06/26/2021 but is increased in conspicuity compared to more remote prior studies. There is no  focal consolidation. There is no evidence of pulmonary edema. There is no significant pleural effusion. There is no pneumothorax. There is no acute osseous abnormality. IMPRESSION: 1. No evidence of pulmonary edema. 2. Focal opacity projecting over the left midlung measuring 1.4 cm, unchanged compared to the most recent prior study but increased in conspicuity compared to more remote  studies. Recommend characterization with nonemergent chest CT. Electronically Signed   By: Valetta Mole M.D.   On: 07/23/2021 12:47    Labs: BMET Recent Labs  Lab 07/30/2021 1247 07/09/2021 1257 07/30/2021 1804 07/31/2021 1839 07/20/2021 2315 07/18/21 0550  NA 138 137 142 141 140 143  K >7.5* 7.4* 5.0 5.4* 3.6 4.2  CL 107  --  104 104 103 104  CO2 <7*  --  9* <7* 11* 15*  GLUCOSE 147*  --  224* 260* 138* 100*  BUN >300*  --  >300* >300* 217* 225*  CREATININE 14.10*  --  14.31* 14.28* 9.91* 10.50*  CALCIUM 8.0*  --  8.2* 8.1* 7.8* 7.7*  PHOS 8.2*  --   --  7.7*  --  6.9*   CBC Recent Labs  Lab 07/18/2021 1247 08/05/2021 1257 07/16/2021 1804 07/18/21 0550  WBC 13.0*  --  11.3* 8.5  NEUTROABS 12.3*  --   --   --   HGB 10.7* 10.5* 9.2* 9.4*  HCT 32.8* 31.0* 27.5* 27.6*  MCV 102.8*  --  98.9 95.2  PLT 184  --  167 147*    Medications:     Chlorhexidine Gluconate Cloth  6 each Topical Q0600   [START ON 07/19/2021] doxercalciferol  1 mcg Intravenous Q T,Th,Sa-HD   insulin aspart  0-6 Units Subcutaneous Q4H   mouth rinse  15 mL Mouth Rinse BID      Gean Quint, MD [facility] 07/18/2021, 8:29 AM

## 2021-07-19 ENCOUNTER — Other Ambulatory Visit: Payer: Self-pay | Admitting: *Deleted

## 2021-07-19 DIAGNOSIS — Z7189 Other specified counseling: Secondary | ICD-10-CM | POA: Diagnosis not present

## 2021-07-19 DIAGNOSIS — Z515 Encounter for palliative care: Secondary | ICD-10-CM | POA: Diagnosis not present

## 2021-07-19 DIAGNOSIS — N186 End stage renal disease: Secondary | ICD-10-CM | POA: Diagnosis not present

## 2021-07-19 DIAGNOSIS — Z66 Do not resuscitate: Secondary | ICD-10-CM | POA: Diagnosis not present

## 2021-07-19 LAB — BASIC METABOLIC PANEL
Anion gap: 27 — ABNORMAL HIGH (ref 5–15)
BUN: 201 mg/dL — ABNORMAL HIGH (ref 8–23)
CO2: 8 mmol/L — ABNORMAL LOW (ref 22–32)
Calcium: 7.6 mg/dL — ABNORMAL LOW (ref 8.9–10.3)
Chloride: 104 mmol/L (ref 98–111)
Creatinine, Ser: 10.38 mg/dL — ABNORMAL HIGH (ref 0.44–1.00)
GFR, Estimated: 4 mL/min — ABNORMAL LOW (ref >60)
Glucose, Bld: 79 mg/dL (ref 70–99)
Potassium: 5.2 mmol/L — ABNORMAL HIGH (ref 3.5–5.1)
Sodium: 139 mmol/L (ref 135–145)

## 2021-07-19 LAB — GLUCOSE, CAPILLARY
Glucose-Capillary: 86 mg/dL (ref 70–99)
Glucose-Capillary: 88 mg/dL (ref 70–99)

## 2021-07-19 LAB — HEPATITIS B SURFACE ANTIBODY, QUANTITATIVE: Hep B S AB Quant (Post): 33.4 m[IU]/mL (ref >9.9)

## 2021-07-19 MED ORDER — HYDROMORPHONE HCL 1 MG/ML IJ SOLN
0.5000 mg | INTRAMUSCULAR | Status: DC | PRN
  Administered 2021-07-24 – 2021-07-25 (×8): 0.5 mg via INTRAVENOUS
  Filled 2021-07-19 (×8): qty 0.5

## 2021-07-19 MED ORDER — FENTANYL BOLUS VIA INFUSION
100.0000 ug | INTRAVENOUS | Status: DC | PRN
  Filled 2021-07-19: qty 100

## 2021-07-19 MED ORDER — LORAZEPAM 2 MG/ML IJ SOLN
1.0000 mg | INTRAMUSCULAR | Status: DC | PRN
  Administered 2021-07-25: 1 mg via INTRAVENOUS
  Filled 2021-07-19: qty 1

## 2021-07-19 MED ORDER — DIPHENHYDRAMINE HCL 50 MG/ML IJ SOLN: 25.0000 mg | INTRAMUSCULAR | Status: DC | PRN

## 2021-07-19 MED ORDER — ACETAMINOPHEN 650 MG RE SUPP: 650.0000 mg | Freq: Four times a day (QID) | RECTAL | Status: DC | PRN

## 2021-07-19 MED ORDER — ACETAMINOPHEN 325 MG PO TABS: 650.0000 mg | ORAL_TABLET | Freq: Four times a day (QID) | ORAL | Status: DC | PRN

## 2021-07-19 MED ORDER — GLYCOPYRROLATE 0.2 MG/ML IJ SOLN: 0.2000 mg | INTRAMUSCULAR | Status: DC | PRN

## 2021-07-19 MED ORDER — POLYVINYL ALCOHOL 1.4 % OP SOLN
1.0000 [drp] | Freq: Four times a day (QID) | OPHTHALMIC | Status: DC | PRN
  Filled 2021-07-19: qty 15

## 2021-07-19 MED ORDER — GLYCOPYRROLATE 1 MG PO TABS
1.0000 mg | ORAL_TABLET | ORAL | Status: DC | PRN
  Filled 2021-07-19: qty 1

## 2021-07-19 MED ORDER — DEXTROSE 5 % IV SOLN: INTRAVENOUS | Status: DC

## 2021-07-19 MED ORDER — SODIUM ZIRCONIUM CYCLOSILICATE 10 G PO PACK: 10.0000 g | PACK | Freq: Once | ORAL | Status: DC

## 2021-07-19 NOTE — Consult Note (Signed)
   [facility] Inpatient Consult   07/19/2021  Cindy Burns 09/20/1946 [id number]  [place] Organization [ACO] Patient: Designer, jewellery Medicare  Primary Care Provider:  [name], MD, [facility], [facility] is currently an Embedded Provider  Patient was recently active with [place] [facility] for chronic disease management services.  Patient has been engaged by a [place].  Our community based plan of care has focused on disease management and community resource support.    Chart review reveals patient is currently under comfort measures.    Plan: Will update [facility] RN Care Coordinator of information. Will follow only if appropriate.    For additional questions or referrals please contact:  Natividad Brood, RN BSN [facility] Liaison  [phone] business mobile phone Toll free office [phone]  Fax number: [phone] [email or web address]

## 2021-07-19 NOTE — Progress Notes (Signed)
Patient transitioned to comfort care/hospice. Appreciate assistance from CCM and palliative care. Holding further dialysis. Will sign off from a nephrology perspective. Please call with any questions/concerns. Gean Quint, MD [facility]

## 2021-07-19 NOTE — Progress Notes (Signed)
After 30 minutes of hemodialysis treatment patient demanded to stop treatment stating that she doesn't like the way she feels and it makes her feel "exhausted". Explained to pt that she needs dialysis to live and explained the benefits of dialysis treatment. Explained to patient that missing dialysis treatments will cause more illness and may lead to death. On-call nephrologist notified.

## 2021-07-19 NOTE — Consult Note (Signed)
Consultation Note Date: 07/19/2021   Patient Name: Cindy Burns  DOB: 30-May-1946  MRN: [id number]  Age / Sex: 75 y.o., female  PCP: [name], MD Referring Physician: Juanito Doom, MD  Reason for Consultation: Establishing goals of care  HPI/Patient Profile: 75 y.o. female  with past medical history of ESRD on HD, COPD, tobacco use, T2DM, lung nodule, and depression admitted on 07/13/2021 with shortness of breath. Had not been to HD since 9/27. Found to have K >7.5 and Ph of 7.07. Has not tolerated HD well since admission. Patient expressed she would like to discontinue HD. Patient expressed to critical care team she would like to initiate full comfort measures. PMT consulted to assist.  Clinical Assessment and Goals of Care: I have reviewed medical records including EPIC notes, labs and imaging, received report from RN and Dr. Lake Bells, assessed the patient and then met with patient  to discuss diagnosis prognosis, GOC, EOL wishes, disposition and options.  I introduced Palliative Medicine as specialized medical care for people living with serious illness. It focuses on providing relief from the symptoms and stress of a serious illness. The goal is to improve quality of life for both the patient and the family.  Patient confirms she is only interested in comfort measures moving forward. I asked her if I could call any of her contacts listed in her chart to explain situation but she declines.  We discussed her current symptoms - she denies pain. Tells me she is having some shortness of breath - we discussed that pain medications can be helpful in relieving shortness of breath - she expresses understanding but declines pain medicine at this point. We discuss they will be available to her as needed.  Discuss care moving forward - discuss continuing comfort measures in the hospital vs a transfer to hospice facility. She tells me she wants  to go home. We discuss this would not be a safe option for her as she needs 24/7 caregivers. She expresses understanding and tells me she does not want to go to hospice facility. She would like to continue comfort measures in the hospital. We discussed transferring her to 6N.   Primary Decision Maker PATIENT   SUMMARY OF RECOMMENDATIONS   - continue comfort measures only - orders adjusted to reflect comfort measures only - added PRN dilaudid and PRN ativan - patient would like to continue comfort measures in hospital - does not want to go to hospice facility - transfer to 6N - patient declines me contacting friends/family  Code Status/Advance Care Planning: DNR  Additional Recommendations (Limitations, Scope, Preferences): Full Comfort Care  Prognosis:  Hours - Days  Discharge Planning: Anticipated Hospital Death      Primary Diagnoses: Present on Admission:  ESRD (end stage renal disease) ([place])   I have reviewed the medical record, interviewed the patient and family, and examined the patient. The following aspects are pertinent.  Past Medical History:  Diagnosis Date   AKI (acute kidney injury) ([place]) 01/2020   Cancer ([place]) 2005   Breast Cancer   COPD (chronic obstructive pulmonary disease) ([place])    Depression    Diabetes mellitus without complication ([place])    Hyperlipidemia    Social History   Socioeconomic History   Marital status: Divorced    Spouse name: Not on file   Number of children: 1   Years of education: 12   Highest education level: GED or equivalent  Occupational History   Occupation: Retired  Tobacco  Use   Smoking status: Every Day    Packs/day: 1.00    Years: 40.00    Pack years: 40.00    Types: Cigarettes   Smokeless tobacco: Never  Vaping Use   Vaping Use: Never used  Substance and Sexual Activity   Alcohol use: No   Drug use: No   Sexual activity: Not Currently  Other Topics Concern   Not on file  Social History Narrative   Single, 1  daughter in [place]    on medicare/medicaid, disabled.   Obtained GED    Social Determinants of Health   Financial Resource Strain: Not on file  Food Insecurity: No Food Insecurity   Worried About Charity fundraiser in the Last Year: Never true   Ran Out of Food in the Last Year: Never true  Transportation Needs: No Transportation Needs   Lack of Transportation (Medical): No   Lack of Transportation (Non-Medical): No  Physical Activity: Inactive   Days of Exercise per Week: 0 days   Minutes of Exercise per Session: 0 min  Stress: Stress Concern Present   Feeling of Stress : To some extent  Social Connections: Moderately Isolated   Frequency of Communication with Friends and Family: More than three times a week   Frequency of Social Gatherings with Friends and Family: More than three times a week   Attends Religious Services: 1 to 4 times per year   Active Member of [employer] or Organizations: No   Attends Music therapist: Never   Marital Status: Divorced   Family History  Problem Relation Age of Onset   Arthritis Mother    Alzheimer's disease Mother    Dementia Father    COPD Brother    COPD Brother    Scheduled Meds:  mouth rinse  15 mL Mouth Rinse BID   Continuous Infusions:  sodium chloride     PRN Meds:.acetaminophen **OR** acetaminophen, diphenhydrAMINE, glycopyrrolate **OR** glycopyrrolate **OR** glycopyrrolate, HYDROmorphone (DILAUDID) injection, ipratropium-albuterol, LORazepam, polyvinyl alcohol, sodium chloride flush No Active Allergies Review of Systems  Constitutional:  Positive for activity change, appetite change and fatigue.  Respiratory:  Positive for shortness of breath.   Neurological:  Positive for weakness.   Physical Exam Constitutional:      Comments: Sleeping but wakes easily to voice  Pulmonary:     Comments: Slightly labored - declines opioids at this time Skin:    General: Skin is warm and dry.  Neurological:     Mental  Status: She is oriented to person, place, and time.    Vital Signs: BP 138/70   Pulse 88   Temp 97.6 F (36.4 C) (Oral)   Resp (!) 22   Ht _0  (1.575 m)   Wt 49.9 kg   SpO2 95%   BMI 20.12 kg/m  Pain Scale: 0-10   Pain Score: 0-No pain   SpO2: SpO2: 95 % O2 Device:SpO2: 95 % O2 Flow Rate: .O2 Flow Rate (L/min): 3 L/min  IO: Intake/output summary:  Intake/Output Summary (Last 24 hours) at 07/19/2021 1313 Last data filed at 07/19/2021 0330 Gross per 24 hour  Intake --  Output -24 ml  Net 24 ml    LBM: Last BM Date: 07/18/21 Baseline Weight: Weight: 49.9 kg Most recent weight: Weight: 49.9 kg     Palliative Assessment/Data: PPS 20%    Time Total: 30 minutes Greater than 50%  of this time was spent counseling and coordinating care related to the above assessment and plan.  Juel Burrow, DNP, AGNP-C Palliative Medicine Team [phone] Pager: [phone]

## 2021-07-19 NOTE — Patient Outreach (Signed)
[place] [facility]) Care Management  07/19/2021  Cindy Burns 11-Sep-1946 [id number]   [facility] Case closure   RN CM notified by [facility] hospital liaison that [name] is for comfort measures only now Her palliative care consult resulted in patient confirming she not longer wanted dialysis but comfort care in the hospital vs a hospice facility    RN CM had briefly reviewed EPIC notes on 07/19/21 as pt was pending [facility] case closure for unable to maintain contact.   RN CM noted pt admission on 07/14/2021 with the review and pended until RN CM would be able to collaborate with [facility] RN CM hospital liaison  Pt was noted to be admitted with shortness of breath (sob), confused at times, pungent smell after not having dialysis in a month. Diagnosis End Stage Renal Disease (ESRD) with severe metabolic acidosis, uremia and life threatening hyperkalemia  She was pending a palliative care consult    Unsuccessful outreach letter sent on 06/22/21 Unsuccessful outreach on 06/18/21, 06/22/21, 07/02/21   Plan [facility] RN CM will close case after no response from patient within 10 business days. [name] is for comfort measures only now     [place]. [name], RN, BSN, [place] Coordinator Office number [phone] Mobile number [phone]  Main [facility] number [phone] Fax number [phone]

## 2021-07-19 NOTE — Progress Notes (Addendum)
NAME:  Cindy Burns, MRN:  [id number], DOB:  [birth year], LOS: 2 ADMISSION DATE:  07/29/2021, CONSULTATION DATE:  08/02/2021 REFERRING MD:  Humberto Leep, CHIEF COMPLAINT:  Acute Uremic Encephalopathy w/ AGMA   Brief History   75 year old female who presented to the ER 10/11 w/ CC shortness of breath. Per clinic review had not been to HD since 9/27. Has been progressively more short of breath which was why she presented. In ER found to have several metabolic derangements including: metabolic acidosis, K >1.8 and Ph of 7.07. She was a poor historian. Would follow commands but confused at times. Nephrology was consulted and PCCM asked to admit.   Past Medical History  ESRD COPD Tobacco use disorder T2DM Lt, mid lung nodule Depression  Significant Hospital Events   10/11 Admitted w/ increased WOB, K > 7.5, Ph 7.07 w/ mild encephalopathy. PCCM asked to admit. K treated in ER and Nephro consulted for urgent HD  10/12 - Had 1 hr of HD overnight with seizure-like activity.  10/13 - only tolerated 30 minutes of HD  Consults:  Nephrology  Procedures:  none  Significant Diagnostic Tests:   DG Chest Portable 1 View Result Date: 07/08/2021 FINDINGS: A right-sided dialysis catheter is in stable position. The cardiomediastinal silhouette is stable. There is focal opacity projecting over the left midlung measuring 1.4 cm which is similar compared to the most recent prior study of 06/26/2021 but is increased in conspicuity compared to more remote prior studies. There is no focal consolidation. There is no evidence of pulmonary edema. There is no significant pleural effusion. There is no pneumothorax. There is no acute osseous abnormality.  IMPRESSION: 1. No evidence of pulmonary edema. 2. Focal opacity projecting over the left midlung measuring 1.4 cm, unchanged compared to the most recent prior study but increased in conspicuity compared to more remote studies. Recommend characterization with nonemergent  chest CT.    Micro Data:  10/11 COVID PCR negative 10/1 Bcx pending   Antimicrobials:  none    Interim History/Subjective:  Overnight events: Did not tolerate HD overnight.   Patient resting in bed.  She is alert but disoriented to time, place, and event but is redirectable.  She is tachypnic but appears comfortable and denies any acute complaints.   Objective:  Blood pressure (!) 154/71, pulse 94, temperature 98.8 F (37.1 C), temperature source Oral, resp. rate 19, height 5\' 2"  (1.575 m), weight 49.9 kg, SpO2 98 %.        Intake/Output Summary (Last 24 hours) at 07/19/2021 2993 Last data filed at 07/19/2021 0330 Gross per 24 hour  Intake --  Output -24 ml  Net 24 ml   Filed Weights   07/18/21 0500 07/19/21 0144 07/19/21 0330  Weight: 48.8 kg 49.5 kg 49.9 kg    Examination: General: alert but sleepy this morning HENT: PERRLA with normal extraocular motion Lungs: cont to have Inspiratory wheezing and upper lung fields  Cardiovascular: Regular rate and rhythm no murmurs rubs or gallops Abdomen: Soft nondistended Extremities: Warm with palpable pulses in distal extremities Neuro: Mild tremor and minimally hyperreflexive GU: N/AA  Patient Lines/Drains/Airways Status     Active Line/Drains/Airways     Name Placement date Placement time Site Days   Peripheral IV 08/02/2021 22 G Left Forearm 07/13/2021  1449  Forearm  1   Hemodialysis Catheter Right Internal jugular Double lumen Permanent (Tunneled) 02/01/20  1805  Internal jugular  533   External Urinary Catheter 07/26/2021  2100  --  1   Pressure Injury 01/09/20 Heel Left Deep Tissue Pressure Injury - Purple or maroon localized area of discolored intact skin or blood-filled blister due to damage of underlying soft tissue from pressure and/or shear. purplr color with red circle around it 01/09/20  1000  -- 556   Pressure Injury 02/06/20 Coccyx Mid Stage 1 -  Intact skin with non-blanchable redness of a localized area usually  over a bony prominence. 02/06/20  1950  -- 528   Pressure Injury 08/16/20 Coccyx Medial Stage 1 -  Intact skin with non-blanchable redness of a localized area usually over a bony prominence. 08/16/20  1239  -- 336   Wound / Incision (Open or Dehisced) 01/09/20 Toe (Comment  which one) Left dark, hard with redness 01/09/20  1000  Toe (Comment  which one)  [facility] Problem list   none  Assessment & Plan:  Cindy Burns is a 75 y.o. with a pertinent PMH of ESRD, COPD, T2DM, who presented with [facility] in the setting of missed HD sessions and admitted for acute uremic encephalopathy complicated by severe hyperkalemia.   Acute Uremic Encephalopathy: Severe Hyperkalemia  ESRD (HD adherence issues) AGMA: Patient refusing HD. K this morning is 5.2. Additionally she has a significant AGMA with a HCO# of 8 and a GAP of 27. Will need palliative care discussion for GOC and possible comfort care in the near future.  - Palliative care consulted - Appreciate nephrologies assistance.  Milly Jakob for hyperkalemia - Consider starting Na Bicarb infusion  Goals of Care: Patient has expressed that she would like to discontinue HD. She keeps refusing HD leading to significant complications.  I have been unable to get ahold of the patients emergency contact to discuss her current clinical situation.  - Palliative Care consult   Leukocytosis: WBC resolved. Bcx no growth at < 12 hours.  - Bcx pending - no need for AB  H/o tobacco abuse. COPD and Left mid lung pulm nodule w/ h/o breast cancer  CT chest when stable PRN BD Smoking cessation counseling needed   T2DM: - SSI and CBG monitoring   Chronic anemia  Plan Trend   Best Practice:  Diet: NPO needs swallow evaluation Pain/Anxiety/Delirium protocol (if indicated): n/a VAP protocol (if indicated): n/a DVT prophylaxis: SCD GI prophylaxis: N/A Glucose control: SSI Lines: Lines: N/A Foley: Foley:  N/A Mobility: bed  rest Code Status: DNR Family Communication: pending Disposition: ICU  Labs   CBC: Recent Labs  Lab 07/19/2021 1247 07/10/2021 1257 08/04/2021 1804 07/18/21 0550 07/18/21 0845  WBC 13.0*  --  11.3* 8.5  --   NEUTROABS 12.3*  --   --   --   --   HGB 10.7* 10.5* 9.2* 9.4* 7.8*  HCT 32.8* 31.0* 27.5* 27.6* 23.0*  MCV 102.8*  --  98.9 95.2  --   PLT 184  --  167 147*  --     Basic Metabolic Panel: Recent Labs  Lab 07/07/2021 1247 07/22/2021 1257 08/01/2021 1839 07/14/2021 2315 07/18/21 0550 07/18/21 0845 07/18/21 1527 07/19/21 0451  NA 138   < > 141 140 143 141 140 139  K >7.5*   < > 5.4* 3.6 4.2 4.2 4.7 5.2*  CL 107   < > 104 103 104  --  102 104  CO2 <7*   < > <7* 11* 15*  --  15* 8*  GLUCOSE 147*   < > 260* 138* 100*  --  132* 79  BUN >300*   < > >300* 217* 225*  --  224* 201*  CREATININE 14.10*   < > 14.28* 9.91* 10.50*  --  11.09* 10.38*  CALCIUM 8.0*   < > 8.1* 7.8* 7.7*  --  7.4* 7.6*  MG 2.2  --   --   --   --   --   --   --   PHOS 8.2*  --  7.7*  --  6.9*  --   --   --    < > = values in this interval not displayed.   GFR: Estimated Creatinine Clearance: 3.9 mL/min (A) (by C-G formula based on SCr of 10.38 mg/dL (H)). Recent Labs  Lab 07/07/2021 1247 07/07/2021 1804 07/09/2021 1839 07/16/2021 2315 07/18/21 0550  PROCALCITON  --  0.26  --   --   --   WBC 13.0* 11.3*  --   --  8.5  LATICACIDVEN  --   --  3.6* 3.7*  --     Liver Function Tests: Recent Labs  Lab 08/02/2021 1839  ALBUMIN 3.5   No results for input(s): LIPASE, AMYLASE in the last 168 hours. No results for input(s): AMMONIA in the last 168 hours.  ABG    Component Value Date/Time   PHART 7.390 07/18/2021 0845   PCO2ART 23.7 (L) 07/18/2021 0845   PO2ART 103 07/18/2021 0845   HCO3 14.3 (L) 07/18/2021 0845   TCO2 15 (L) 07/18/2021 0845   ACIDBASEDEF 9.0 (H) 07/18/2021 0845   O2SAT 98.0 07/18/2021 0845     Coagulation Profile: Recent Labs  Lab 07/14/2021 1804  INR 1.2    Cardiac Enzymes: No  results for input(s): CKTOTAL, CKMB, CKMBINDEX, TROPONINI in the last 168 hours.  HbA1C: Hemoglobin A1C  Date/Time Value Ref Range Status  12/08/2020 04:12 PM 4.7 4.0 - 5.6 % Final   Hgb A1c MFr Bld  Date/Time Value Ref Range Status  07/16/2021 04:05 PM 5.8 (H) 4.8 - 5.6 % Final    Comment:    (NOTE) Pre diabetes:          5.7%-6.4%  Diabetes:              >6.4%  Glycemic control for   <7.0% adults with diabetes   09/06/2020 03:18 AM 4.4 (L) 4.8 - 5.6 % Final    Comment:    (NOTE) Pre diabetes:          5.7%-6.4%  Diabetes:              >6.4%  Glycemic control for   <7.0% adults with diabetes     CBG: Recent Labs  Lab 07/18/21 1135 07/18/21 1513 07/18/21 1925 07/18/21 2338 07/19/21 0317  GLUCAP 115* 134* 89 80 86    Review of Systems:   See above  Past Medical History  She,  has a past medical history of AKI (acute kidney injury) ([place]) (01/2020), Cancer ([place]) (2005), COPD (chronic obstructive pulmonary disease) ([place]), Depression, Diabetes mellitus without complication ([place]), and Hyperlipidemia.   Surgical History    Past Surgical History:  Procedure Laterality Date   BREAST SURGERY  2005   CATARACT EXTRACTION     CHOLECYSTECTOMY  2013   IR FLUORO GUIDE CV LINE RIGHT  02/01/2020   IR US GUIDE VASC ACCESS RIGHT  02/01/2020     Social History   reports that she has been smoking cigarettes. She has a 40.00 pack-year smoking history. She has never used smokeless tobacco. She reports  that she does not drink alcohol and does not use drugs.   Family History   Her family history includes Alzheimer's disease in her mother; Arthritis in her mother; COPD in her brother and brother; Dementia in her father.   Allergies No Active Allergies   Home Medications  Prior to Admission medications   Medication Sig Start Date End Date Taking? Authorizing Provider  amLODipine (NORVASC) 10 MG tablet Take 1 tablet (10 mg total) by mouth daily. 02/20/20 04/29/29 Yes Alma Friendly, MD  gabapentin (NEURONTIN) 300 MG capsule TAKE 1 CAPSULE BY MOUTH AFTER DIALYSIS ON DIALYSIS ON DAYS Patient taking differently: Take 300 mg by mouth See admin instructions. After dialysis Tuesday,Thursday,saturday 01/18/21  Yes Bedsole, Amy E, MD  JANUVIA 25 MG tablet TAKE 1 TABLET BY MOUTH EVERY DAY Patient taking differently: Take 25 mg by mouth daily. 03/29/21  Yes Bedsole, Amy E, MD  lidocaine (LIDODERM) 5 % Place 1 patch onto the skin daily. Remove & Discard patch within 12 hours or as directed by MD 06/11/21  Yes Sponseller, Eugene Garnet R, PA-C  metoprolol tartrate (LOPRESSOR) 25 MG tablet Take 1 tablet (25 mg total) by mouth 2 (two) times daily. 02/05/21 07/10/2021 Yes Bedsole, Amy E, MD  rosuvastatin (CRESTOR) 20 MG tablet TAKE 1 TABLET BY MOUTH EVERY DAY Patient taking differently: Take 20 mg by mouth daily. 07/27/20  Yes Bedsole, Amy E, MD  sevelamer carbonate (RENVELA) 800 MG tablet Take 1,600 mg by mouth See admin instructions. Take 1,600 mg by mouth with meals (when eating)   Yes [provider]  traMADol (ULTRAM) 50 MG tablet TAKE 1/2 TABLET BY MOUTH TWICE DAILY AS NEEDED Patient taking differently: Take 25 mg by mouth every 12 (twelve) hours as needed for moderate pain. 04/18/21  Yes Bedsole, Amy E, MD  venlafaxine XR (EFFEXOR-XR) 37.5 MG 24 hr capsule TAKE 1 CAPSULE (37.5 MG TOTAL) BY MOUTH AT BEDTIME. Patient taking differently: Take 37.5 mg by mouth at bedtime. 04/18/21  Yes Bedsole, Amy E, MD  albuterol (VENTOLIN HFA) 108 (90 Base) MCG/ACT inhaler TAKE 2 PUFFS BY MOUTH EVERY 6 HOURS AS NEEDED FOR WHEEZE OR SHORTNESS OF BREATH Patient not taking: Reported on 08/01/2021 05/31/21   [name], MD  folic acid (FOLVITE) 1 MG tablet Take 1 tablet (1 mg total) by mouth daily. Patient taking differently: Take 1 mg by mouth See admin instructions. Take on dialysis days, Tuesday, Thursday, Saturday 08/17/20 08/17/21  Debbe Odea, MD  ipratropium-albuterol (DUONEB) 0.5-2.5 (3)  MG/3ML SOLN Take 3 mLs by nebulization every 6 (six) hours as needed. Patient not taking: Reported on 07/28/2021 02/19/20 04/29/29  Alma Friendly, MD  iron sucrose in sodium chloride 0.9 % 100 mL Inject into the vein. 10/19/20 10/18/21  [provider]  Methoxy PEG-Epoetin Beta (MIRCERA IJ) Mircera 11/07/20 11/06/21  [provider]  sodium bicarbonate 650 MG tablet Take 2 tablets (1,300 mg total) by mouth 3 (three) times daily. Patient not taking: No sig reported 10/25/20   Debbe Odea, MD     Critical care time: [place], D.O.  Internal Medicine Resident, PGY-3 [facility]  Pager: [phone] 6:14 AM, 07/19/2021

## 2021-07-19 NOTE — Progress Notes (Signed)
[facility] Progress Note    Assessment/ Plan:   # ESRD:  Outpatient orders: MWF, [place], 3 hours 15 minutes, 2K, 2 Cal, 400/autoflow 1.5, EDW 47.5 kg.  Mircera 50 mcg every 2 weeks, hectorol  -reattempt HD again later today unless seen by palliative care and stopping HD. Lower flow rates today, 2 hr treatment -palliative care consult due to her wish of not wanting to do HD  Seizure like activity -on 10/11. Secondary to uremia vs dialysis dysequilibrium syndrome? Unlikely DDS given slow flow rates and she is neurologically doing better now   # Dyspnea -likely related to COPD, acidemia (more likely), volume? Nonetheless, will UF as tolerated. Improving   # Metabolic acidosis/acidemia -likely secondary to lack of HD, HD as above   #Hyperkalemia -s/p shifting agents, lokelma ordered for today, HD as above. Agree with lokelma today   # Volume/ hypertension: EDW 47.5kg. Will UF as tolerated   # Anemia of Chronic Kidney Disease: Hemoglobin 10.7. Receives mircera 50 mcg q2wks (last dose 9/27). ESA ordered   # Secondary Hyperparathyroidism/Hyperphosphatemia: resume home binders, resume hectorol    # Vascular access: [facility]   # Additional recommendations: - Dose all meds for creatinine clearance < 10 ml/min  - Unless absolutely necessary, no MRIs with gadolinium.  - Implement save arm precautions.  Prefer needle sticks in the dorsum of the hands or wrists.  No blood pressure measurements in arm. - If blood transfusion is requested during hemodialysis sessions, please alert [place] prior to the session.  - If a hemodialysis catheter line culture is requested, please alert [place] as only hemodialysis nurses are able to collect those specimens.    Recommendations were discussed with the primary team.   Gean Quint, MD [facility]  Subjective:   Only had 30 min of HD overnight until patient demanded to be taken off, felt 'exhausted'. HD RN did explain the risks of  not doing dialysis to the patient. Palliative care pending. Open for HD later today. Not really communicative today but awake Palliative care consult pending.   Objective:   BP (!) 161/69 (BP Location: Left Arm)   Pulse 93   Temp 97.6 F (36.4 C) (Oral)   Resp (!) 22   Ht 5\' 2"  (1.575 m)   Wt 49.9 kg   SpO2 97%   BMI 20.12 kg/m   Intake/Output Summary (Last 24 hours) at 07/19/2021 0943 Last data filed at 07/19/2021 0330 Gross per 24 hour  Intake --  Output -24 ml  Net 24 ml   Weight change: -0.396 kg  Physical Exam: ERX:[id number] ill appearing CVS:rrr Resp:less tachypneic, cta bl, on [place] KDT:OIZT Ext:no edema Neuro: awake  HD access: [facility]  Imaging: DG Chest Portable 1 View  Result Date: 08/01/2021 CLINICAL DATA:  Shortness of breath, missed dialysis EXAM: PORTABLE CHEST 1 VIEW COMPARISON:  Chest radiograph 06/26/2021 FINDINGS: A right-sided dialysis catheter is in stable position. The cardiomediastinal silhouette is stable. There is focal opacity projecting over the left midlung measuring 1.4 cm which is similar compared to the most recent prior study of 06/26/2021 but is increased in conspicuity compared to more remote prior studies. There is no focal consolidation. There is no evidence of pulmonary edema. There is no significant pleural effusion. There is no pneumothorax. There is no acute osseous abnormality. IMPRESSION: 1. No evidence of pulmonary edema. 2. Focal opacity projecting over the left midlung measuring 1.4 cm, unchanged compared to the most recent prior study but increased in  conspicuity compared to more remote studies. Recommend characterization with nonemergent chest CT. Electronically Signed   By: Valetta Mole M.D.   On: 07/07/2021 12:47    Labs: BMET Recent Labs  Lab 08/04/2021 1247 07/24/2021 1257 07/14/2021 1804 07/15/2021 1839 07/26/2021 2315 07/18/21 0550 07/18/21 0845 07/18/21 1527 07/19/21 0451  NA 138   < > 142 141 140 143 141 140 139  K >7.5*    < > 5.0 5.4* 3.6 4.2 4.2 4.7 5.2*  CL 107  --  104 104 103 104  --  102 104  CO2 <7*  --  9* <7* 11* 15*  --  15* 8*  GLUCOSE 147*  --  224* 260* 138* 100*  --  132* 79  BUN >300*  --  >300* >300* 217* 225*  --  224* 201*  CREATININE 14.10*  --  14.31* 14.28* 9.91* 10.50*  --  11.09* 10.38*  CALCIUM 8.0*  --  8.2* 8.1* 7.8* 7.7*  --  7.4* 7.6*  PHOS 8.2*  --   --  7.7*  --  6.9*  --   --   --    < > = values in this interval not displayed.   CBC Recent Labs  Lab 07/22/2021 1247 07/12/2021 1257 07/15/2021 1804 07/18/21 0550 07/18/21 0845  WBC 13.0*  --  11.3* 8.5  --   NEUTROABS 12.3*  --   --   --   --   HGB 10.7* 10.5* 9.2* 9.4* 7.8*  HCT 32.8* 31.0* 27.5* 27.6* 23.0*  MCV 102.8*  --  98.9 95.2  --   PLT 184  --  167 147*  --     Medications:     Chlorhexidine Gluconate Cloth  6 each Topical Q0600   darbepoetin (ARANESP) injection - DIALYSIS  60 mcg Intravenous Q Wed-HD   doxercalciferol  1 mcg Intravenous Q T,Th,Sa-HD   insulin aspart  0-6 Units Subcutaneous Q4H   mouth rinse  15 mL Mouth Rinse BID   sevelamer carbonate  1,600 mg Oral TID WC   sodium zirconium cyclosilicate  10 g Oral Once      Gean Quint, MD [facility] 07/19/2021, 9:43 AM

## 2021-07-19 NOTE — Progress Notes (Signed)
Attending:    Subjective: Received 30 minutes of hemodialysis last night She refused HD after initiation This morning her K is up   Objective: Vitals:   07/19/21 0500 07/19/21 0600 07/19/21 0745 07/19/21 0800  BP: (!) 153/67 (!) 154/71  (!) 161/69  Pulse: 88 94  93  Resp: (!) 24 19  (!) 22  Temp:   97.6 F (36.4 C)   TempSrc:   Oral   SpO2: 96% 98%  97%  Weight:      Height:          Intake/Output Summary (Last 24 hours) at 07/19/2021 0857 Last data filed at 07/19/2021 0330 Gross per 24 hour  Intake --  Output -24 ml  Net 24 ml    General:  Frail, elderly female resting comfortably in bed HENT: NCAT OP clear PULM: CTA B, normal effort CV: RRR, no mgr GI: BS+, soft, nontender MSK: normal bulk and tone Neuro: awake, alert, no distress, MAEW   CBC    Component Value Date/Time   WBC 8.5 07/18/2021 0550   RBC 2.90 (L) 07/18/2021 0550   HGB 7.8 (L) 07/18/2021 0845   HCT 23.0 (L) 07/18/2021 0845   PLT 147 (L) 07/18/2021 0550   MCV 95.2 07/18/2021 0550   MCH 32.4 07/18/2021 0550   MCHC 34.1 07/18/2021 0550   RDW 13.0 07/18/2021 0550   LYMPHSABS 0.4 (L) 07/26/2021 1247   MONOABS 0.2 07/14/2021 1247   EOSABS 0.0 07/27/2021 1247   BASOSABS 0.0 07/09/2021 1247    BMET    Component Value Date/Time   NA 139 07/19/2021 0451   K 5.2 (H) 07/19/2021 0451   CL 104 07/19/2021 0451   CO2 8 (L) 07/19/2021 0451   GLUCOSE 79 07/19/2021 0451   BUN 201 (H) 07/19/2021 0451   CREATININE 10.38 (H) 07/19/2021 0451   CALCIUM 7.6 (L) 07/19/2021 0451   GFRNONAA 4 (L) 07/19/2021 0451   GFRAA 24 (L) 04/29/2020 1800    CXR images none today: 10/11 CXR with emphysema, R IJ HD cath in place  Impression/Plan: ESRD: refuses further hemodialysis Hyperkalemia Metabolic acidosis Protein calorie malnutrition Uremia Centrilobular emphysema Protein calorie malnutrition  Plan I spoke to the patient this morning, she wants to move towards full comfort measures.  Will write  orders for prn morphine, comfort  care order set per the patient's wishes.  No further lab draws, no medications other than full comfort medications. Palliative care consulted to help with next steps: inpatient death vs transfer to inpatient hospice facility  My cc time n/a minutes  Roselie Awkward, MD [facility] Pager: [phone] Cell: [phone] After 7pm: [phone]

## 2021-07-20 ENCOUNTER — Other Ambulatory Visit: Payer: Self-pay | Admitting: Family Medicine

## 2021-07-20 DIAGNOSIS — N186 End stage renal disease: Secondary | ICD-10-CM | POA: Diagnosis not present

## 2021-07-20 DIAGNOSIS — R0602 Shortness of breath: Secondary | ICD-10-CM

## 2021-07-20 DIAGNOSIS — Z515 Encounter for palliative care: Secondary | ICD-10-CM

## 2021-07-20 DIAGNOSIS — Z66 Do not resuscitate: Secondary | ICD-10-CM

## 2021-07-20 NOTE — Progress Notes (Signed)
   Palliative Medicine Inpatient Follow Up Note     Chart Reviewed.  Updates received from RN.  Patient assessed at the bedside.   Patient is somewhat somnolent but easily aroused.  Answers all questions appropriately when awakened.  Denies pain at this time.  States all medications are effective with managing her symptoms.  Patient's close friend is at the bedside providing support.  Education provided on comfort focused care and expectations at end of life.  Friend verbalized understanding and appreciation.  Questions addressed and support provided.    Objective Assessment: Vital Signs Vitals:   07/20/21 0500 07/20/21 0600  BP:    Pulse: 95 84  Resp: 17 (!) 26  Temp:    SpO2: 95% 95%    Intake/Output Summary (Last 24 hours) at 07/20/2021 1401 Last data filed at 07/19/2021 2000 Gross per 24 hour  Intake 56.47 ml  Output 100 ml  Net -43.53 ml   Last Weight  Most recent update: 07/19/2021  4:40 AM    Weight  49.9 kg (110 lb 0.2 oz)            Gen: Somnolent but easily awaken, frail, ill-appearing CV: RRR PULM: Diminished bilaterally Neuro: Easily awakened, no distress noted  SUMMARY OF RECOMMENDATIONS   Continue with comfort focused care All symptoms managed effectively with as needed medications Education provided to friend regarding comfort focused care and expectations at end of life. PMT will continue to support and follow on as needed basis. Please secure chat for urgent needs.    Time Total: 20 min  Visit consisted of counseling and education dealing with the complex and emotionally intense issues of symptom management and palliative care in the setting of serious and potentially life-threatening illness.Greater than 50%  of this time was spent counseling and coordinating care related to the above assessment and plan.  Alda Lea, AGPCNP-BC  Palliative Medicine Team [phone]  Palliative Medicine Team providers are available by phone  from 7am to 7pm daily and can be reached through the team cell phone. Should this patient require assistance outside of these hours, please call the patient's attending physician.

## 2021-07-20 NOTE — Progress Notes (Signed)
PROGRESS NOTE    Cindy Burns  IRW:[id number] DOB: 07/06/1946 DOA: 07/09/2021 PCP: [name], MD    Brief Narrative:  Patient is 75 year old female with past medical history of ESRD on hemodialysis, COPD, tobacco use, type 2 diabetes, depression who was admitted on 10/11 with shortness of breath.  She had not been to the dialysis since 9/27.  Presented with potassium of 7.5 and pH of 7.07.  Admitted to ICU.  Had been tried on hemodialysis but not able to tolerate.  She desired to discontinue hemodialysis. started on comfort care measures on 10/13.  Assessment & Plan:   Active Problems:   End of life care   ESRD (end stage renal disease) ([place])  ESRD on hemodialysis, intolerance to hemodialysis Uremia with dialysis disequilibrium syndrome Shortness of breath, acute on chronic with COPD, acidosis. Hyperkalemia Anemia of chronic disease End-of-life care  Plan: On full comfort care measures. Called her friend and updated on patient's request.  She is coming to visit. All comfort care medications available. Transfer to palliative floor. Hospital death is anticipated.  RN can pronounce death.    DVT prophylaxis:   Comfort care   Code Status: Comfort care Family Communication: Patient's good friend on the phone Disposition Plan: Status is: Inpatient  Remains inpatient appropriate because: End-of-life care         Consultants:  Critical care Nephrology Palliative  Procedures:  Hemodialysis  Antimicrobials:  None   Subjective: I examined patient in the morning.  Patient stated that she could not sleep last night.  Recommended patient's nurse to give her more medications for comfort and anxiety.  Has some shortness of breath but denies any chest pain.  Objective: Vitals:   07/20/21 0300 07/20/21 0400 07/20/21 0500 07/20/21 0600  BP:      Pulse: 84 82 95 84  Resp: (!) 21 (!) 21 17 (!) 26  Temp:      TempSrc:      SpO2: 96% 96% 95% 95%  Weight:       Height:        Intake/Output Summary (Last 24 hours) at 07/20/2021 1416 Last data filed at 07/19/2021 2000 Gross per 24 hour  Intake 56.47 ml  Output 100 ml  Net -43.53 ml   Filed Weights   07/18/21 0500 07/19/21 0144 07/19/21 0330  Weight: 48.8 kg 49.5 kg 49.9 kg    Examination:  General: Very frail and debilitated.  Quite lethargic but able to keep up conversation on interview. Cardiovascular: S1-S2 normal.  Tachycardic. Respiratory: Bilateral conducted airway sounds.  On 2 L oxygen for comfort. Gastrointestinal: Soft and nontender.     Data Reviewed: I have personally reviewed following labs and imaging studies  CBC: Recent Labs  Lab 07/14/2021 1247 07/08/2021 1257 07/18/2021 1804 07/18/21 0550 07/18/21 0845  WBC 13.0*  --  11.3* 8.5  --   NEUTROABS 12.3*  --   --   --   --   HGB 10.7* 10.5* 9.2* 9.4* 7.8*  HCT 32.8* 31.0* 27.5* 27.6* 23.0*  MCV 102.8*  --  98.9 95.2  --   PLT 184  --  167 147*  --    Basic Metabolic Panel: Recent Labs  Lab 07/30/2021 1247 07/09/2021 1257 08/05/2021 1839 08/05/2021 2315 07/18/21 0550 07/18/21 0845 07/18/21 1527 07/19/21 0451  NA 138   < > 141 140 143 141 140 139  K >7.5*   < > 5.4* 3.6 4.2 4.2 4.7 5.2*  CL 107   < >  104 103 104  --  102 104  CO2 <7*   < > <7* 11* 15*  --  15* 8*  GLUCOSE 147*   < > 260* 138* 100*  --  132* 79  BUN >300*   < > >300* 217* 225*  --  224* 201*  CREATININE 14.10*   < > 14.28* 9.91* 10.50*  --  11.09* 10.38*  CALCIUM 8.0*   < > 8.1* 7.8* 7.7*  --  7.4* 7.6*  MG 2.2  --   --   --   --   --   --   --   PHOS 8.2*  --  7.7*  --  6.9*  --   --   --    < > = values in this interval not displayed.   GFR: Estimated Creatinine Clearance: 3.9 mL/min (A) (by C-G formula based on SCr of 10.38 mg/dL (H)). Liver Function Tests: Recent Labs  Lab 07/26/2021 1839  ALBUMIN 3.5   No results for input(s): LIPASE, AMYLASE in the last 168 hours. No results for input(s): AMMONIA in the last 168 hours. Coagulation  Profile: Recent Labs  Lab 07/22/2021 1804  INR 1.2   Cardiac Enzymes: No results for input(s): CKTOTAL, CKMB, CKMBINDEX, TROPONINI in the last 168 hours. BNP (last 3 results) No results for input(s): PROBNP in the last 8760 hours. HbA1C: Recent Labs    07/30/2021 1605  HGBA1C 5.8*   CBG: Recent Labs  Lab 07/18/21 1513 07/18/21 1925 07/18/21 2338 07/19/21 0317 07/19/21 0743  GLUCAP 134* 89 80 86 88   Lipid Profile: No results for input(s): CHOL, HDL, LDLCALC, TRIG, CHOLHDL, LDLDIRECT in the last 72 hours. Thyroid Function Tests: No results for input(s): TSH, T4TOTAL, FREET4, T3FREE, THYROIDAB in the last 72 hours. Anemia Panel: No results for input(s): VITAMINB12, FOLATE, FERRITIN, TIBC, IRON, RETICCTPCT in the last 72 hours. Sepsis Labs: Recent Labs  Lab 07/30/2021 1804 07/09/2021 1839 07/24/2021 2315  PROCALCITON 0.26  --   --   LATICACIDVEN  --  3.6* 3.7*    Recent Results (from the past 240 hour(s))  Resp Panel by RT-PCR (Flu A&B, Covid) Nasopharyngeal Swab     Status: None   Collection Time: 07/26/2021 12:14 PM   Specimen: Nasopharyngeal Swab; Nasopharyngeal(NP) swabs in vial transport medium  Result Value Ref Range Status   SARS Coronavirus 2 by RT PCR NEGATIVE NEGATIVE Final    Comment: (NOTE) SARS-CoV-2 target nucleic acids are NOT DETECTED.  The SARS-CoV-2 RNA is generally detectable in upper respiratory specimens during the acute phase of infection. The lowest concentration of SARS-CoV-2 viral copies this assay can detect is 138 copies/mL. A negative result does not preclude SARS-Cov-2 infection and should not be used as the sole basis for treatment or other patient management decisions. A negative result may occur with  improper specimen collection/handling, submission of specimen other than nasopharyngeal swab, presence of viral mutation(s) within the areas targeted by this assay, and inadequate number of viral copies(<138 copies/mL). A negative result must  be combined with clinical observations, patient history, and epidemiological information. The expected result is Negative.  Fact Sheet for Patients:  EntrepreneurPulse.com.au  Fact Sheet for Healthcare Providers:  IncredibleEmployment.be  This test is no t yet approved or cleared by the [place] FDA and  has been authorized for detection and/or diagnosis of SARS-CoV-2 by FDA under an Emergency Use Authorization (EUA). This EUA will remain  in effect (meaning this test can be used) for the duration of  the COVID-19 declaration under Section 564(b)(1) of the Act, 21 U.S.C.section 360bbb-3(b)(1), unless the authorization is terminated  or revoked sooner.       Influenza A by PCR NEGATIVE NEGATIVE Final   Influenza B by PCR NEGATIVE NEGATIVE Final    Comment: (NOTE) The Xpert Xpress SARS-CoV-2/FLU/RSV plus assay is intended as an aid in the diagnosis of influenza from Nasopharyngeal swab specimens and should not be used as a sole basis for treatment. Nasal washings and aspirates are unacceptable for Xpert Xpress SARS-CoV-2/FLU/RSV testing.  Fact Sheet for Patients: EntrepreneurPulse.com.au  Fact Sheet for Healthcare Providers: IncredibleEmployment.be  This test is not yet approved or cleared by the [place] FDA and has been authorized for detection and/or diagnosis of SARS-CoV-2 by FDA under an Emergency Use Authorization (EUA). This EUA will remain in effect (meaning this test can be used) for the duration of the COVID-19 declaration under Section 564(b)(1) of the Act, 21 U.S.C. section 360bbb-3(b)(1), unless the authorization is terminated or revoked.  Performed at [facility], [place] [address], [place], [place] [address]   Culture, blood (routine x 2)     Status: None (Preliminary result)   Collection Time: 08/05/2021  6:25 PM   Specimen: BLOOD RIGHT ARM  Result Value Ref Range Status    Specimen Description BLOOD RIGHT ARM  Final   Special Requests   Final    BOTTLES DRAWN AEROBIC ONLY Blood Culture results may not be optimal due to an inadequate volume of blood received in culture bottles RIGHT LATERAL   Culture   Final    NO GROWTH 3 DAYS Performed at [facility], [place] [address], [place], [place] [address]    Report Status PENDING  Incomplete  Culture, blood (routine x 2)     Status: None (Preliminary result)   Collection Time: 07/29/2021  6:40 PM   Specimen: BLOOD RIGHT HAND  Result Value Ref Range Status   Specimen Description BLOOD RIGHT HAND  Final   Special Requests   Final    BOTTLES DRAWN AEROBIC ONLY Blood Culture adequate volume   Culture   Final    NO GROWTH 3 DAYS Performed at [facility], [place] [address] [place], [place] [address]    Report Status PENDING  Incomplete         Radiology Studies: No results found.      Scheduled Meds:  mouth rinse  15 mL Mouth Rinse BID   Continuous Infusions:  sodium chloride       LOS: 3 days    Time spent: 32 minutes    Barb Merino, MD [facility] Pager [phone]

## 2021-07-21 DIAGNOSIS — E872 Acidosis, unspecified: Secondary | ICD-10-CM

## 2021-07-21 DIAGNOSIS — Z7189 Other specified counseling: Secondary | ICD-10-CM

## 2021-07-21 DIAGNOSIS — Z515 Encounter for palliative care: Secondary | ICD-10-CM | POA: Diagnosis not present

## 2021-07-21 DIAGNOSIS — R0602 Shortness of breath: Secondary | ICD-10-CM | POA: Diagnosis not present

## 2021-07-21 DIAGNOSIS — N186 End stage renal disease: Secondary | ICD-10-CM | POA: Diagnosis not present

## 2021-07-21 MED ORDER — LORAZEPAM 2 MG/ML PO CONC: 0.5000 mg | ORAL | Status: DC | PRN

## 2021-07-21 MED ORDER — OXYCODONE HCL 20 MG/ML PO CONC
10.0000 mg | ORAL | Status: DC | PRN
  Administered 2021-07-22: 10 mg via ORAL
  Filled 2021-07-21 (×2): qty 1

## 2021-07-21 MED ORDER — ORAL CARE MOUTH RINSE: 15.0000 mL | Freq: Two times a day (BID) | OROMUCOSAL | Status: DC | PRN

## 2021-07-21 MED ORDER — WHITE PETROLATUM EX OINT
TOPICAL_OINTMENT | CUTANEOUS | Status: AC
  Administered 2021-07-21: 1
  Filled 2021-07-21: qty 28.35

## 2021-07-21 NOTE — Progress Notes (Signed)
PROGRESS NOTE    Cindy Burns  MGN:[id number] DOB: November 11, 1945 DOA: 07/22/2021 PCP: [name], MD    Brief Narrative:  Patient is 75 year old female with past medical history of ESRD on hemodialysis, COPD, tobacco use, type 2 diabetes, depression who was admitted on 10/11 with shortness of breath.  She had not been to the dialysis since 9/27.  Presented with potassium of 7.5 and pH of 7.07.  Admitted to ICU.  Had been tried on hemodialysis but not able to tolerate.  She desired to discontinue hemodialysis. started on comfort care measures on 10/13.  Assessment & Plan:   Active Problems:   End of life care   ESRD (end stage renal disease) ([place])  ESRD on hemodialysis, intolerance to hemodialysis Uremia with dialysis disequilibrium syndrome Shortness of breath, acute on chronic with COPD, acidosis. Hyperkalemia Anemia of chronic disease End-of-life care  Plan: On full comfort care measures. All comfort care medications available. Transfer to palliative floor. Hospital death is anticipated.  RN can pronounce death. Patient with flat affect.  Does not agree with referral to inpatient hospice. Palliative to follow.    DVT prophylaxis:   Comfort care   Code Status: Comfort care Family Communication: None. Disposition Plan: Status is: Inpatient  Remains inpatient appropriate because: End-of-life care         Consultants:  Critical care Nephrology Palliative  Procedures:  Hemodialysis  Antimicrobials:  None   Subjective: Patient seen and examined.  She talks very limited.  She tells me she is not feeling well and did not sleep last night.  She denies any pain or discomfort. I asked her whether I should refer her to hospice home and patient nods no. She looks very withdrawn and depressed and not very forthcoming.  Objective: Vitals:   07/20/21 2008 07/20/21 2009 07/21/21 0406 07/21/21 0920  BP:   (!) 152/67 (!) 142/61  Pulse: 87 86 88 88  Resp: (!)  22 (!) 22 18 18   Temp:  (!) 97.5 F (36.4 C) 97.8 F (36.6 C) 97.6 F (36.4 C)  TempSrc:  Oral Oral Oral  SpO2: 96% 96% 97% 97%  Weight:      Height:        Intake/Output Summary (Last 24 hours) at 07/21/2021 1341 Last data filed at 07/20/2021 2000 Gross per 24 hour  Intake 0 ml  Output 0 ml  Net 0 ml   Filed Weights   07/18/21 0500 07/19/21 0144 07/19/21 0330  Weight: 48.8 kg 49.5 kg 49.9 kg    Examination:  General: Very frail and debilitated.  Quite lethargic but able to keep up some conversation. Cardiovascular: S1-S2 normal.   Respiratory: Bilateral conducted airway sounds.  On room air. Gastrointestinal: Soft and nontender.     Data Reviewed: I have personally reviewed following labs and imaging studies  CBC: Recent Labs  Lab 07/14/2021 1247 08/04/2021 1257 07/28/2021 1804 07/18/21 0550 07/18/21 0845  WBC 13.0*  --  11.3* 8.5  --   NEUTROABS 12.3*  --   --   --   --   HGB 10.7* 10.5* 9.2* 9.4* 7.8*  HCT 32.8* 31.0* 27.5* 27.6* 23.0*  MCV 102.8*  --  98.9 95.2  --   PLT 184  --  167 147*  --    Basic Metabolic Panel: Recent Labs  Lab 07/14/2021 1247 08/03/2021 1257 07/11/2021 1839 07/28/2021 2315 07/18/21 0550 07/18/21 0845 07/18/21 1527 07/19/21 0451  NA 138   < > 141 140 143 141 140  139  K >7.5*   < > 5.4* 3.6 4.2 4.2 4.7 5.2*  CL 107   < > 104 103 104  --  102 104  CO2 <7*   < > <7* 11* 15*  --  15* 8*  GLUCOSE 147*   < > 260* 138* 100*  --  132* 79  BUN >300*   < > >300* 217* 225*  --  224* 201*  CREATININE 14.10*   < > 14.28* 9.91* 10.50*  --  11.09* 10.38*  CALCIUM 8.0*   < > 8.1* 7.8* 7.7*  --  7.4* 7.6*  MG 2.2  --   --   --   --   --   --   --   PHOS 8.2*  --  7.7*  --  6.9*  --   --   --    < > = values in this interval not displayed.   GFR: Estimated Creatinine Clearance: 3.9 mL/min (A) (by C-G formula based on SCr of 10.38 mg/dL (H)). Liver Function Tests: Recent Labs  Lab 07/12/2021 1839  ALBUMIN 3.5   No results for input(s): LIPASE,  AMYLASE in the last 168 hours. No results for input(s): AMMONIA in the last 168 hours. Coagulation Profile: Recent Labs  Lab 07/14/2021 1804  INR 1.2   Cardiac Enzymes: No results for input(s): CKTOTAL, CKMB, CKMBINDEX, TROPONINI in the last 168 hours. BNP (last 3 results) No results for input(s): PROBNP in the last 8760 hours. HbA1C: No results for input(s): HGBA1C in the last 72 hours.  CBG: Recent Labs  Lab 07/18/21 1513 07/18/21 1925 07/18/21 2338 07/19/21 0317 07/19/21 0743  GLUCAP 134* 89 80 86 88   Lipid Profile: No results for input(s): CHOL, HDL, LDLCALC, TRIG, CHOLHDL, LDLDIRECT in the last 72 hours. Thyroid Function Tests: No results for input(s): TSH, T4TOTAL, FREET4, T3FREE, THYROIDAB in the last 72 hours. Anemia Panel: No results for input(s): VITAMINB12, FOLATE, FERRITIN, TIBC, IRON, RETICCTPCT in the last 72 hours. Sepsis Labs: Recent Labs  Lab 07/30/2021 1804 07/22/2021 1839 07/29/2021 2315  PROCALCITON 0.26  --   --   LATICACIDVEN  --  3.6* 3.7*    Recent Results (from the past 240 hour(s))  Resp Panel by RT-PCR (Flu A&B, Covid) Nasopharyngeal Swab     Status: None   Collection Time: 07/20/2021 12:14 PM   Specimen: Nasopharyngeal Swab; Nasopharyngeal(NP) swabs in vial transport medium  Result Value Ref Range Status   SARS Coronavirus 2 by RT PCR NEGATIVE NEGATIVE Final    Comment: (NOTE) SARS-CoV-2 target nucleic acids are NOT DETECTED.  The SARS-CoV-2 RNA is generally detectable in upper respiratory specimens during the acute phase of infection. The lowest concentration of SARS-CoV-2 viral copies this assay can detect is 138 copies/mL. A negative result does not preclude SARS-Cov-2 infection and should not be used as the sole basis for treatment or other patient management decisions. A negative result may occur with  improper specimen collection/handling, submission of specimen other than nasopharyngeal swab, presence of viral mutation(s) within  the areas targeted by this assay, and inadequate number of viral copies(<138 copies/mL). A negative result must be combined with clinical observations, patient history, and epidemiological information. The expected result is Negative.  Fact Sheet for Patients:  EntrepreneurPulse.com.au  Fact Sheet for Healthcare Providers:  IncredibleEmployment.be  This test is no t yet approved or cleared by the [place] FDA and  has been authorized for detection and/or diagnosis of SARS-CoV-2 by FDA under an Emergency  Use Authorization (EUA). This EUA will remain  in effect (meaning this test can be used) for the duration of the COVID-19 declaration under Section 564(b)(1) of the Act, 21 U.S.C.section 360bbb-3(b)(1), unless the authorization is terminated  or revoked sooner.       Influenza A by PCR NEGATIVE NEGATIVE Final   Influenza B by PCR NEGATIVE NEGATIVE Final    Comment: (NOTE) The Xpert Xpress SARS-CoV-2/FLU/RSV plus assay is intended as an aid in the diagnosis of influenza from Nasopharyngeal swab specimens and should not be used as a sole basis for treatment. Nasal washings and aspirates are unacceptable for Xpert Xpress SARS-CoV-2/FLU/RSV testing.  Fact Sheet for Patients: EntrepreneurPulse.com.au  Fact Sheet for Healthcare Providers: IncredibleEmployment.be  This test is not yet approved or cleared by the [place] FDA and has been authorized for detection and/or diagnosis of SARS-CoV-2 by FDA under an Emergency Use Authorization (EUA). This EUA will remain in effect (meaning this test can be used) for the duration of the COVID-19 declaration under Section 564(b)(1) of the Act, 21 U.S.C. section 360bbb-3(b)(1), unless the authorization is terminated or revoked.  Performed at [facility], [place] [address] [place], [place] [address]   Culture, blood (routine x 2)     Status: None  (Preliminary result)   Collection Time: 07/20/2021  6:25 PM   Specimen: BLOOD RIGHT ARM  Result Value Ref Range Status   Specimen Description BLOOD RIGHT ARM  Final   Special Requests   Final    BOTTLES DRAWN AEROBIC ONLY Blood Culture results may not be optimal due to an inadequate volume of blood received in culture bottles RIGHT LATERAL   Culture   Final    NO GROWTH 4 DAYS Performed at [facility], [place] [address], [place], [place] [address]    Report Status PENDING  Incomplete  Culture, blood (routine x 2)     Status: None (Preliminary result)   Collection Time: 07/14/2021  6:40 PM   Specimen: BLOOD RIGHT HAND  Result Value Ref Range Status   Specimen Description BLOOD RIGHT HAND  Final   Special Requests   Final    BOTTLES DRAWN AEROBIC ONLY Blood Culture adequate volume   Culture   Final    NO GROWTH 4 DAYS Performed at [facility], [place] [address], [place],  [address]    Report Status PENDING  Incomplete         Radiology Studies: No results found.      Scheduled Meds:  mouth rinse  15 mL Mouth Rinse BID   Continuous Infusions:  sodium chloride       LOS: 4 days    Time spent: 25 minutes.    Barb Merino, MD [facility] Pager [phone]

## 2021-07-21 NOTE — Progress Notes (Addendum)
Attempted to call report to [address].

## 2021-07-21 NOTE — Progress Notes (Signed)
   Palliative Medicine Inpatient Follow Up Note     Chart Reviewed. Patient assessed at the bedside.  Comfort focused care.  No acute distress noted.  Denies pain or shortness of breath.  Somewhat withdrawn with minimal verbal response.  Patient continues to decline hospice home referral despite recommendations.  No family or friends at the bedside.  Education provided on continued symptom management in addition to outpatient hospice goals and philosophy of care.  Patient is aware care received at hospice home would be identical to care receiving while hospitalized.  Patient declines, closes eyes, with no interest in continued conversations.   Support provided.  Objective Assessment: Vital Signs Vitals:   07/21/21 0406 07/21/21 0920  BP: (!) 152/67 (!) 142/61  Pulse: 88 88  Resp: 18 18  Temp: 97.8 F (36.6 C) 97.6 F (36.4 C)  SpO2: 97% 97%    Intake/Output Summary (Last 24 hours) at 07/21/2021 1707 Last data filed at 07/21/2021 1500 Gross per 24 hour  Intake 120 ml  Output 0 ml  Net 120 ml   Last Weight  Most recent update: 07/19/2021  4:40 AM    Weight  49.9 kg (110 lb 0.2 oz)            Gen:  NAD, frail, somnolent but easily awakened CV: Regular rate and rhythm PULM: clear to auscultation bilaterally. ABD: soft/nontender/nondistended/normal bowel sounds Neuro: Somnolent, able to follow commands, AAO x3 in discussions.  SUMMARY OF RECOMMENDATIONS   Continue with comfort focused care Manage all symptoms with as needed medications Education and recommendation provided to patient regarding outpatient hospice home referral however patient continues to decline and becomes withdrawn when further discussing. PMT will continue to support and follow on as needed basis.  May not see daily. please secure chat for urgent needs.    Time Total: 25 min.   Visit consisted of counseling and education dealing with the complex and emotionally intense issues of symptom  management and palliative care in the setting of serious and potentially life-threatening illness.Greater than 50%  of this time was spent counseling and coordinating care related to the above assessment and plan.  Alda Lea, AGPCNP-BC  Palliative Medicine Team [phone]  Palliative Medicine Team providers are available by phone from 7am to 7pm daily and can be reached through the team cell phone. Should this patient require assistance outside of these hours, please call the patient's attending physician.

## 2021-07-22 DIAGNOSIS — Z515 Encounter for palliative care: Secondary | ICD-10-CM | POA: Diagnosis not present

## 2021-07-22 LAB — CULTURE, BLOOD (ROUTINE X 2)
Culture: NO GROWTH
Culture: NO GROWTH
Special Requests: ADEQUATE

## 2021-07-22 MED ORDER — ONDANSETRON HCL 4 MG/2ML IJ SOLN
4.0000 mg | Freq: Four times a day (QID) | INTRAMUSCULAR | Status: DC | PRN
  Administered 2021-07-22 – 2021-07-24 (×3): 4 mg via INTRAVENOUS
  Filled 2021-07-22 (×3): qty 2

## 2021-07-22 NOTE — Progress Notes (Signed)
PROGRESS NOTE    Cindy Burns  [id number]:[id number] DOB: 22-Apr-1946 DOA: 07/20/2021 PCP: [name], MD    Brief Narrative:  Patient is 75 year old female with past medical history of ESRD on hemodialysis, COPD, tobacco use, type 2 diabetes, depression who was admitted on 10/11 with shortness of breath.  She had not been to the dialysis since 9/27.  Presented with potassium of 7.5 and pH of 7.07.  Admitted to ICU.  Had been tried on hemodialysis but not able to tolerate.  She desired to discontinue hemodialysis. started on comfort care measures on 10/13.  Assessment & Plan:   Active Problems:   End of life care   ESRD (end stage renal disease) ([facility])  ESRD on hemodialysis, intolerance to hemodialysis Uremia with dialysis disequilibrium syndrome Shortness of breath, acute on chronic with COPD, acidosis. Hyperkalemia Anemia of chronic disease End-of-life care  Plan: On full comfort care measures. All comfort care medications available. Hospital death is anticipated.  RN can pronounce death. Does not agree with referral to inpatient hospice. Palliative to follow.  Nursing staff to provide her appropriate care in the hospital including medications and feeding.    DVT prophylaxis:   Comfort care   Code Status: Comfort care Family Communication: None. Disposition Plan: Status is: Inpatient  Remains inpatient appropriate because: End-of-life care         Consultants:  Critical care Nephrology Palliative  Procedures:  Hemodialysis  Antimicrobials:  None   Subjective: Patient seen and examined.  When I examined her in the morning rounds, she had a dinner tray on her side since yesterday.  She has not been offered any fresh food or drinks.  Denies any pain but unable to sleep at night.  Objective: Vitals:   07/20/21 2009 07/21/21 0406 07/21/21 0920 07/22/21 0420  BP:  (!) 152/67 (!) 142/61 138/61  Pulse: 86 88 88 92  Resp: (!) 22 18 18 20   Temp: (!) 97.5  F (36.4 C) 97.8 F (36.6 C) 97.6 F (36.4 C) 97.6 F (36.4 C)  TempSrc: Oral Oral Oral Oral  SpO2: 96% 97% 97% 100%  Weight:      Height:        Intake/Output Summary (Last 24 hours) at 07/22/2021 1406 Last data filed at 07/21/2021 1500 Gross per 24 hour  Intake 120 ml  Output --  Net 120 ml   Filed Weights   07/18/21 0500 07/19/21 0144 07/19/21 0330  Weight: 48.8 kg 49.5 kg 49.9 kg    Examination:  General: Very frail and debilitated.  Alert and awake. Cardiovascular: S1-S2 normal.   Respiratory: Bilateral conducted airway sounds.  On room air. Gastrointestinal: Soft and nontender.     Data Reviewed: I have personally reviewed following labs and imaging studies  CBC: Recent Labs  Lab 08/01/2021 1247 08/05/2021 1257 07/12/2021 1804 07/18/21 0550 07/18/21 0845  WBC 13.0*  --  11.3* 8.5  --   NEUTROABS 12.3*  --   --   --   --   HGB 10.7* 10.5* 9.2* 9.4* 7.8*  HCT 32.8* 31.0* 27.5* 27.6* 23.0*  MCV 102.8*  --  98.9 95.2  --   PLT 184  --  167 147*  --    Basic Metabolic Panel: Recent Labs  Lab 07/12/2021 1247 07/23/2021 1257 07/19/2021 1839 08/02/2021 2315 07/18/21 0550 07/18/21 0845 07/18/21 1527 07/19/21 0451  NA 138   < > 141 140 143 141 140 139  K >7.5*   < > 5.4* 3.6 4.2  4.2 4.7 5.2*  CL 107   < > 104 103 104  --  102 104  CO2 <7*   < > <7* 11* 15*  --  15* 8*  GLUCOSE 147*   < > 260* 138* 100*  --  132* 79  BUN >300*   < > >300* 217* 225*  --  224* 201*  CREATININE 14.10*   < > 14.28* 9.91* 10.50*  --  11.09* 10.38*  CALCIUM 8.0*   < > 8.1* 7.8* 7.7*  --  7.4* 7.6*  MG 2.2  --   --   --   --   --   --   --   PHOS 8.2*  --  7.7*  --  6.9*  --   --   --    < > = values in this interval not displayed.   GFR: Estimated Creatinine Clearance: 3.9 mL/min (A) (by C-G formula based on SCr of 10.38 mg/dL (H)). Liver Function Tests: Recent Labs  Lab 07/29/2021 1839  ALBUMIN 3.5   No results for input(s): LIPASE, AMYLASE in the last 168 hours. No results for  input(s): AMMONIA in the last 168 hours. Coagulation Profile: Recent Labs  Lab 07/14/2021 1804  INR 1.2   Cardiac Enzymes: No results for input(s): CKTOTAL, CKMB, CKMBINDEX, TROPONINI in the last 168 hours. BNP (last 3 results) No results for input(s): PROBNP in the last 8760 hours. HbA1C: No results for input(s): HGBA1C in the last 72 hours.  CBG: Recent Labs  Lab 07/18/21 1513 07/18/21 1925 07/18/21 2338 07/19/21 0317 07/19/21 0743  GLUCAP 134* 89 80 86 88   Lipid Profile: No results for input(s): CHOL, HDL, LDLCALC, TRIG, CHOLHDL, LDLDIRECT in the last 72 hours. Thyroid Function Tests: No results for input(s): TSH, T4TOTAL, FREET4, T3FREE, THYROIDAB in the last 72 hours. Anemia Panel: No results for input(s): VITAMINB12, FOLATE, FERRITIN, TIBC, IRON, RETICCTPCT in the last 72 hours. Sepsis Labs: Recent Labs  Lab 08/05/2021 1804 07/12/2021 1839 07/14/2021 2315  PROCALCITON 0.26  --   --   LATICACIDVEN  --  3.6* 3.7*    Recent Results (from the past 240 hour(s))  Resp Panel by RT-PCR (Flu A&B, Covid) Nasopharyngeal Swab     Status: None   Collection Time: 08/05/2021 12:14 PM   Specimen: Nasopharyngeal Swab; Nasopharyngeal(NP) swabs in vial transport medium  Result Value Ref Range Status   SARS Coronavirus 2 by RT PCR NEGATIVE NEGATIVE Final    Comment: (NOTE) SARS-CoV-2 target nucleic acids are NOT DETECTED.  The SARS-CoV-2 RNA is generally detectable in upper respiratory specimens during the acute phase of infection. The lowest concentration of SARS-CoV-2 viral copies this assay can detect is 138 copies/mL. A negative result does not preclude SARS-Cov-2 infection and should not be used as the sole basis for treatment or other patient management decisions. A negative result may occur with  improper specimen collection/handling, submission of specimen other than nasopharyngeal swab, presence of viral mutation(s) within the areas targeted by this assay, and inadequate  number of viral copies(<138 copies/mL). A negative result must be combined with clinical observations, patient history, and epidemiological information. The expected result is Negative.  Fact Sheet for Patients:  EntrepreneurPulse.com.au  Fact Sheet for Healthcare Providers:  IncredibleEmployment.be  This test is no t yet approved or cleared by the [place] FDA and  has been authorized for detection and/or diagnosis of SARS-CoV-2 by FDA under an Emergency Use Authorization (EUA). This EUA will remain  in effect (meaning  this test can be used) for the duration of the COVID-19 declaration under Section 564(b)(1) of the Act, 21 U.S.C.section 360bbb-3(b)(1), unless the authorization is terminated  or revoked sooner.       Influenza A by PCR NEGATIVE NEGATIVE Final   Influenza B by PCR NEGATIVE NEGATIVE Final    Comment: (NOTE) The Xpert Xpress SARS-CoV-2/FLU/RSV plus assay is intended as an aid in the diagnosis of influenza from Nasopharyngeal swab specimens and should not be used as a sole basis for treatment. Nasal washings and aspirates are unacceptable for Xpert Xpress SARS-CoV-2/FLU/RSV testing.  Fact Sheet for Patients: EntrepreneurPulse.com.au  Fact Sheet for Healthcare Providers: IncredibleEmployment.be  This test is not yet approved or cleared by the [place] FDA and has been authorized for detection and/or diagnosis of SARS-CoV-2 by FDA under an Emergency Use Authorization (EUA). This EUA will remain in effect (meaning this test can be used) for the duration of the COVID-19 declaration under Section 564(b)(1) of the Act, 21 U.S.C. section 360bbb-3(b)(1), unless the authorization is terminated or revoked.  Performed at [facility], [place] [address] [place], [place] [address]   Culture, blood (routine x 2)     Status: None   Collection Time: 08/02/2021  6:25 PM   Specimen: BLOOD  RIGHT ARM  Result Value Ref Range Status   Specimen Description BLOOD RIGHT ARM  Final   Special Requests   Final    BOTTLES DRAWN AEROBIC ONLY Blood Culture results may not be optimal due to an inadequate volume of blood received in culture bottles RIGHT LATERAL   Culture   Final    NO GROWTH 5 DAYS Performed at [facility], [place] [address], [place], [place] [address]    Report Status 07/22/2021 FINAL  Final  Culture, blood (routine x 2)     Status: None   Collection Time: 07/19/2021  6:40 PM   Specimen: BLOOD RIGHT HAND  Result Value Ref Range Status   Specimen Description BLOOD RIGHT HAND  Final   Special Requests   Final    BOTTLES DRAWN AEROBIC ONLY Blood Culture adequate volume   Culture   Final    NO GROWTH 5 DAYS Performed at [facility], [place] [address], [place], [place] [address]    Report Status 07/22/2021 FINAL  Final         Radiology Studies: No results found.      Scheduled Meds:   Continuous Infusions:  sodium chloride       LOS: 5 days    Time spent: 25 minutes.    Barb Merino, MD [facility] Pager [phone]

## 2021-07-23 DIAGNOSIS — Z515 Encounter for palliative care: Secondary | ICD-10-CM | POA: Diagnosis not present

## 2021-07-23 MED ORDER — ZOLPIDEM TARTRATE 5 MG PO TABS
5.0000 mg | ORAL_TABLET | Freq: Every day | ORAL | Status: DC
  Filled 2021-07-23 (×2): qty 1

## 2021-07-23 NOTE — Progress Notes (Signed)
PROGRESS NOTE    Cindy Burns  TKP:[id number] DOB: Nov 16, 1945 DOA: 07/31/2021 PCP: [name], MD    Brief Narrative:  Patient is 75 year old female with past medical history of ESRD on hemodialysis, COPD, tobacco use, type 2 diabetes, depression who was admitted on 10/11 with shortness of breath.  She had not been to the dialysis since 9/27.  Presented with potassium of 7.5 and pH of 7.07.  Admitted to ICU.  Had been tried on hemodialysis but not able to tolerate.  She desired to discontinue hemodialysis. started on comfort care measures on 10/13.  Assessment & Plan:   Active Problems:   End of life care   ESRD (end stage renal disease) ([facility])  ESRD on hemodialysis, intolerance to hemodialysis Uremia with dialysis disequilibrium syndrome Shortness of breath, acute on chronic with COPD, acidosis. Hyperkalemia Anemia of chronic disease End-of-life care  Plan: On full comfort care measures. All comfort care medications available. RN can pronounce death. Does not agree with referral to inpatient hospice. Palliative following. Patient usually does not want any medications. Can use permacath port for medications for comfort as this is not going to be used for dialysis now.  Nursing staff to provide her appropriate care in the hospital including medications and feeding.    DVT prophylaxis:   Comfort care   Code Status: Comfort care Family Communication: None. Disposition Plan: Status is: Inpatient  Remains inpatient appropriate because: End-of-life care         Consultants:  Critical care Nephrology Palliative  Procedures:  Hemodialysis  Antimicrobials:  None   Subjective: Patient seen and examined.  She tells me she is short of breath but does not want any medications.  Denies any pain or discomfort.  Does not get good sleep but again does not want to take any medicine to go to sleep.  Objective: Vitals:   07/21/21 0920 07/22/21 0420 07/23/21  0458 07/23/21 0833  BP: (!) 142/61 138/61 (!) 150/59 (!) 148/59  Pulse: 88 92 88 84  Resp: 18 20 20 19   Temp: 97.6 F (36.4 C) 97.6 F (36.4 C) 97.8 F (36.6 C) (!) 97.5 F (36.4 C)  TempSrc: Oral Oral Oral Oral  SpO2: 97% 100% 99% 100%  Weight:      Height:       No intake or output data in the 24 hours ending 07/23/21 1304  Filed Weights   07/18/21 0500 07/19/21 0144 07/19/21 0330  Weight: 48.8 kg 49.5 kg 49.9 kg    Examination:  General: Frail and debilitated.  Cachectic.  In mild distress and anxious on room air. Cardiovascular: S1-S2 normal.  Regular rate rhythm. Respiratory: Upper airway sounds.  Inspiratory and expiratory wheezes. Gastrointestinal: Soft and nontender.     Data Reviewed: I have personally reviewed following labs and imaging studies  CBC: Recent Labs  Lab 07/20/2021 1247 07/21/2021 1257 07/29/2021 1804 07/18/21 0550 07/18/21 0845  WBC 13.0*  --  11.3* 8.5  --   NEUTROABS 12.3*  --   --   --   --   HGB 10.7* 10.5* 9.2* 9.4* 7.8*  HCT 32.8* 31.0* 27.5* 27.6* 23.0*  MCV 102.8*  --  98.9 95.2  --   PLT 184  --  167 147*  --    Basic Metabolic Panel: Recent Labs  Lab 07/28/2021 1247 08/02/2021 1257 07/26/2021 1839 07/16/2021 2315 07/18/21 0550 07/18/21 0845 07/18/21 1527 07/19/21 0451  NA 138   < > 141 140 143 141 140 139  K >7.5*   < > 5.4* 3.6 4.2 4.2 4.7 5.2*  CL 107   < > 104 103 104  --  102 104  CO2 <7*   < > <7* 11* 15*  --  15* 8*  GLUCOSE 147*   < > 260* 138* 100*  --  132* 79  BUN >300*   < > >300* 217* 225*  --  224* 201*  CREATININE 14.10*   < > 14.28* 9.91* 10.50*  --  11.09* 10.38*  CALCIUM 8.0*   < > 8.1* 7.8* 7.7*  --  7.4* 7.6*  MG 2.2  --   --   --   --   --   --   --   PHOS 8.2*  --  7.7*  --  6.9*  --   --   --    < > = values in this interval not displayed.   GFR: Estimated Creatinine Clearance: 3.9 mL/min (A) (by C-G formula based on SCr of 10.38 mg/dL (H)). Liver Function Tests: Recent Labs  Lab 07/13/2021 1839   ALBUMIN 3.5   No results for input(s): LIPASE, AMYLASE in the last 168 hours. No results for input(s): AMMONIA in the last 168 hours. Coagulation Profile: Recent Labs  Lab 07/15/2021 1804  INR 1.2   Cardiac Enzymes: No results for input(s): CKTOTAL, CKMB, CKMBINDEX, TROPONINI in the last 168 hours. BNP (last 3 results) No results for input(s): PROBNP in the last 8760 hours. HbA1C: No results for input(s): HGBA1C in the last 72 hours.  CBG: Recent Labs  Lab 07/18/21 1513 07/18/21 1925 07/18/21 2338 07/19/21 0317 07/19/21 0743  GLUCAP 134* 89 80 86 88   Lipid Profile: No results for input(s): CHOL, HDL, LDLCALC, TRIG, CHOLHDL, LDLDIRECT in the last 72 hours. Thyroid Function Tests: No results for input(s): TSH, T4TOTAL, FREET4, T3FREE, THYROIDAB in the last 72 hours. Anemia Panel: No results for input(s): VITAMINB12, FOLATE, FERRITIN, TIBC, IRON, RETICCTPCT in the last 72 hours. Sepsis Labs: Recent Labs  Lab 07/24/2021 1804 07/27/2021 1839 08/02/2021 2315  PROCALCITON 0.26  --   --   LATICACIDVEN  --  3.6* 3.7*    Recent Results (from the past 240 hour(s))  Resp Panel by RT-PCR (Flu A&B, Covid) Nasopharyngeal Swab     Status: None   Collection Time: 07/29/2021 12:14 PM   Specimen: Nasopharyngeal Swab; Nasopharyngeal(NP) swabs in vial transport medium  Result Value Ref Range Status   SARS Coronavirus 2 by RT PCR NEGATIVE NEGATIVE Final    Comment: (NOTE) SARS-CoV-2 target nucleic acids are NOT DETECTED.  The SARS-CoV-2 RNA is generally detectable in upper respiratory specimens during the acute phase of infection. The lowest concentration of SARS-CoV-2 viral copies this assay can detect is 138 copies/mL. A negative result does not preclude SARS-Cov-2 infection and should not be used as the sole basis for treatment or other patient management decisions. A negative result may occur with  improper specimen collection/handling, submission of specimen other than  nasopharyngeal swab, presence of viral mutation(s) within the areas targeted by this assay, and inadequate number of viral copies(<138 copies/mL). A negative result must be combined with clinical observations, patient history, and epidemiological information. The expected result is Negative.  Fact Sheet for Patients:  EntrepreneurPulse.com.au  Fact Sheet for Healthcare Providers:  IncredibleEmployment.be  This test is no t yet approved or cleared by the [place] FDA and  has been authorized for detection and/or diagnosis of SARS-CoV-2 by FDA under an Emergency Use Authorization (  EUA). This EUA will remain  in effect (meaning this test can be used) for the duration of the COVID-19 declaration under Section 564(b)(1) of the Act, 21 U.S.C.section 360bbb-3(b)(1), unless the authorization is terminated  or revoked sooner.       Influenza A by PCR NEGATIVE NEGATIVE Final   Influenza B by PCR NEGATIVE NEGATIVE Final    Comment: (NOTE) The Xpert Xpress SARS-CoV-2/FLU/RSV plus assay is intended as an aid in the diagnosis of influenza from Nasopharyngeal swab specimens and should not be used as a sole basis for treatment. Nasal washings and aspirates are unacceptable for Xpert Xpress SARS-CoV-2/FLU/RSV testing.  Fact Sheet for Patients: EntrepreneurPulse.com.au  Fact Sheet for Healthcare Providers: IncredibleEmployment.be  This test is not yet approved or cleared by the [place] FDA and has been authorized for detection and/or diagnosis of SARS-CoV-2 by FDA under an Emergency Use Authorization (EUA). This EUA will remain in effect (meaning this test can be used) for the duration of the COVID-19 declaration under Section 564(b)(1) of the Act, 21 U.S.C. section 360bbb-3(b)(1), unless the authorization is terminated or revoked.  Performed at [facility], [place] [address] [place], [place] [address]    Culture, blood (routine x 2)     Status: None   Collection Time: 08/02/2021  6:25 PM   Specimen: BLOOD RIGHT ARM  Result Value Ref Range Status   Specimen Description BLOOD RIGHT ARM  Final   Special Requests   Final    BOTTLES DRAWN AEROBIC ONLY Blood Culture results may not be optimal due to an inadequate volume of blood received in culture bottles RIGHT LATERAL   Culture   Final    NO GROWTH 5 DAYS Performed at [facility], [place] [address], [place], [place] [address]    Report Status 07/22/2021 FINAL  Final  Culture, blood (routine x 2)     Status: None   Collection Time: 07/28/2021  6:40 PM   Specimen: BLOOD RIGHT HAND  Result Value Ref Range Status   Specimen Description BLOOD RIGHT HAND  Final   Special Requests   Final    BOTTLES DRAWN AEROBIC ONLY Blood Culture adequate volume   Culture   Final    NO GROWTH 5 DAYS Performed at [facility], [place] [address] [place], [place] [address]    Report Status 07/22/2021 FINAL  Final         Radiology Studies: No results found.      Scheduled Meds:   Continuous Infusions:  sodium chloride       LOS: 6 days    Time spent: 20 minutes   Barb Merino, MD [facility] Pager [phone]

## 2021-07-23 NOTE — Progress Notes (Signed)
   Palliative Medicine Inpatient Follow Up Note  Chart reviewed.  I met with Cindy Burns at bedside along with our clinical pharmacist, Terrence Dupont. A brief review of patient symptoms was held, she shares ongoing insomnia though declines a sleep aid.   We discussed Cindy Burns's ongoing comfort needs. Cindy Burns shares she does not wish to be bothered and would like to be left alone to sleep. We kindly excused ourselves.   Palliative support provided.  Objective Assessment: Vital Signs Vitals:   07/23/21 0458 07/23/21 0833  BP: (!) 150/59 (!) 148/59  Pulse: 88 84  Resp: 20 19  Temp: 97.8 F (36.6 C) (!) 97.5 F (36.4 C)  SpO2: 99% 100%    Intake/Output Summary (Last 24 hours) at 07/23/2021 1112 Last data filed at 07/22/2021 1300 Gross per 24 hour  Intake 0 ml  Output --  Net 0 ml    Last Weight  Most recent update: 07/19/2021  4:40 AM    Weight  49.9 kg (110 lb 0.2 oz)            Gen:  NAD, frail, somnolent but easily awakened CV: Regular rate and rhythm PULM: clear to auscultation bilaterally. ABD: soft/nontender/nondistended/normal bowel sounds Neuro: Somnolent, able to follow commands, AAO x3 in discussions.  SUMMARY OF RECOMMENDATIONS   Continue with comfort focused care Manage all symptoms with as needed medications Declined transition to IP hospice home PMT will continue to support and follow on as needed basis.    Time Total: 15 min.   Visit consisted of counseling and education dealing with the complex and emotionally intense issues of symptom management and palliative care in the setting of serious and potentially life-threatening illness.Greater than 50%  of this time was spent counseling and coordinating care related to the above assessment and plan.  [facility] Palliative Medicine Team [phone]  Palliative Medicine Team providers are available by phone from 7am to 7pm daily and can be reached through the team cell phone. Should this patient require assistance  outside of these hours, please call the patient's attending physician.

## 2021-07-24 DIAGNOSIS — Z515 Encounter for palliative care: Secondary | ICD-10-CM | POA: Diagnosis not present

## 2021-07-24 NOTE — Progress Notes (Signed)
PROGRESS NOTE    Cindy Burns  PIR:[id number] DOB: 26-Aug-1946 DOA: 07/26/2021 PCP: [name], MD    Brief Narrative:  Patient is 75 year old female with past medical history of ESRD on hemodialysis, COPD, tobacco use, type 2 diabetes, depression who was admitted on 10/11 with shortness of breath.  She had not been to the dialysis since 9/27.  Presented with potassium of 7.5 and pH of 7.07.  Admitted to ICU.  Had been tried on hemodialysis but not able to tolerate.  She desired to discontinue hemodialysis. started on comfort care measures on 10/13. Patient mostly declines medications.  Assessment & Plan:   Active Problems:   End of life care   ESRD (end stage renal disease) ([place])  ESRD on hemodialysis, intolerance to hemodialysis Uremia with dialysis disequilibrium syndrome Shortness of breath, acute on chronic with COPD, acidosis. Hyperkalemia Anemia of chronic disease End-of-life care  Plan: On full comfort care measures. All comfort care medications available. RN can pronounce death. Does not agree with referral to inpatient hospice. Palliative following. Patient usually does not want any medications. Can use permacath port for medications for comfort as this is not going to be used for dialysis now.  Nursing staff to provide her appropriate care in the hospital including medications and feeding.    DVT prophylaxis:   Comfort care   Code Status: Comfort care Family Communication: None. Disposition Plan: Status is: Inpatient  Remains inpatient appropriate because: End-of-life care         Consultants:  Critical care Nephrology Palliative  Procedures:  Hemodialysis  Antimicrobials:  None   Subjective: Patient seen and examined.  Denies any pain but she does not feel well.  She feels out of breath.  Does not want to eat. Patient does not ask for any medications and tries to avoid it.  She looked obviously short of breath and anxious, I  advised patient to take medications and advised nursing staff to give close attention for comfort care medications.  Objective: Vitals:   07/22/21 0420 07/23/21 0458 07/23/21 0833 07/24/21 0357  BP: 138/61 (!) 150/59 (!) 148/59 (!) 132/51  Pulse: 92 88 84 91  Resp: 20 20 19 16   Temp: 97.6 F (36.4 C) 97.8 F (36.6 C) (!) 97.5 F (36.4 C) 98 F (36.7 C)  TempSrc: Oral Oral Oral Oral  SpO2: 100% 99% 100% 100%  Weight:      Height:        Intake/Output Summary (Last 24 hours) at 07/24/2021 1413 Last data filed at 07/24/2021 0900 Gross per 24 hour  Intake 0 ml  Output --  Net 0 ml    Filed Weights   07/18/21 0500 07/19/21 0144 07/19/21 0330  Weight: 48.8 kg 49.5 kg 49.9 kg    Examination:  General: Frail and debilitated.  Cachectic.  In mild distress.  Anxious. Cardiovascular: S1-S2 normal.  Regular rate rhythm. Respiratory: Upper airway sounds.  Inspiratory and expiratory wheezes. Gastrointestinal: Soft and nontender.     Data Reviewed: I have personally reviewed following labs and imaging studies  CBC: Recent Labs  Lab 08/01/2021 1804 07/18/21 0550 07/18/21 0845  WBC 11.3* 8.5  --   HGB 9.2* 9.4* 7.8*  HCT 27.5* 27.6* 23.0*  MCV 98.9 95.2  --   PLT 167 147*  --    Basic Metabolic Panel: Recent Labs  Lab 07/18/2021 1839 07/16/2021 2315 07/18/21 0550 07/18/21 0845 07/18/21 1527 07/19/21 0451  NA 141 140 143 141 140 139  K 5.4*  3.6 4.2 4.2 4.7 5.2*  CL 104 103 104  --  102 104  CO2 <7* 11* 15*  --  15* 8*  GLUCOSE 260* 138* 100*  --  132* 79  BUN >300* 217* 225*  --  224* 201*  CREATININE 14.28* 9.91* 10.50*  --  11.09* 10.38*  CALCIUM 8.1* 7.8* 7.7*  --  7.4* 7.6*  PHOS 7.7*  --  6.9*  --   --   --    GFR: Estimated Creatinine Clearance: 3.9 mL/min (A) (by C-G formula based on SCr of 10.38 mg/dL (H)). Liver Function Tests: Recent Labs  Lab 07/24/2021 1839  ALBUMIN 3.5   No results for input(s): LIPASE, AMYLASE in the last 168 hours. No results  for input(s): AMMONIA in the last 168 hours. Coagulation Profile: Recent Labs  Lab 07/12/2021 1804  INR 1.2   Cardiac Enzymes: No results for input(s): CKTOTAL, CKMB, CKMBINDEX, TROPONINI in the last 168 hours. BNP (last 3 results) No results for input(s): PROBNP in the last 8760 hours. HbA1C: No results for input(s): HGBA1C in the last 72 hours.  CBG: Recent Labs  Lab 07/18/21 1513 07/18/21 1925 07/18/21 2338 07/19/21 0317 07/19/21 0743  GLUCAP 134* 89 80 86 88   Lipid Profile: No results for input(s): CHOL, HDL, LDLCALC, TRIG, CHOLHDL, LDLDIRECT in the last 72 hours. Thyroid Function Tests: No results for input(s): TSH, T4TOTAL, FREET4, T3FREE, THYROIDAB in the last 72 hours. Anemia Panel: No results for input(s): VITAMINB12, FOLATE, FERRITIN, TIBC, IRON, RETICCTPCT in the last 72 hours. Sepsis Labs: Recent Labs  Lab 08/01/2021 1804 07/19/2021 1839 07/07/2021 2315  PROCALCITON 0.26  --   --   LATICACIDVEN  --  3.6* 3.7*    Recent Results (from the past 240 hour(s))  Resp Panel by RT-PCR (Flu A&B, Covid) Nasopharyngeal Swab     Status: None   Collection Time: 07/20/2021 12:14 PM   Specimen: Nasopharyngeal Swab; Nasopharyngeal(NP) swabs in vial transport medium  Result Value Ref Range Status   SARS Coronavirus 2 by RT PCR NEGATIVE NEGATIVE Final    Comment: (NOTE) SARS-CoV-2 target nucleic acids are NOT DETECTED.  The SARS-CoV-2 RNA is generally detectable in upper respiratory specimens during the acute phase of infection. The lowest concentration of SARS-CoV-2 viral copies this assay can detect is 138 copies/mL. A negative result does not preclude SARS-Cov-2 infection and should not be used as the sole basis for treatment or other patient management decisions. A negative result may occur with  improper specimen collection/handling, submission of specimen other than nasopharyngeal swab, presence of viral mutation(s) within the areas targeted by this assay, and  inadequate number of viral copies(<138 copies/mL). A negative result must be combined with clinical observations, patient history, and epidemiological information. The expected result is Negative.  Fact Sheet for Patients:  EntrepreneurPulse.com.au  Fact Sheet for Healthcare Providers:  IncredibleEmployment.be  This test is no t yet approved or cleared by the [place] FDA and  has been authorized for detection and/or diagnosis of SARS-CoV-2 by FDA under an Emergency Use Authorization (EUA). This EUA will remain  in effect (meaning this test can be used) for the duration of the COVID-19 declaration under Section 564(b)(1) of the Act, 21 U.S.C.section 360bbb-3(b)(1), unless the authorization is terminated  or revoked sooner.       Influenza A by PCR NEGATIVE NEGATIVE Final   Influenza B by PCR NEGATIVE NEGATIVE Final    Comment: (NOTE) The Xpert Xpress SARS-CoV-2/FLU/RSV plus assay is intended as an  aid in the diagnosis of influenza from Nasopharyngeal swab specimens and should not be used as a sole basis for treatment. Nasal washings and aspirates are unacceptable for Xpert Xpress SARS-CoV-2/FLU/RSV testing.  Fact Sheet for Patients: EntrepreneurPulse.com.au  Fact Sheet for Healthcare Providers: IncredibleEmployment.be  This test is not yet approved or cleared by the [place] FDA and has been authorized for detection and/or diagnosis of SARS-CoV-2 by FDA under an Emergency Use Authorization (EUA). This EUA will remain in effect (meaning this test can be used) for the duration of the COVID-19 declaration under Section 564(b)(1) of the Act, 21 U.S.C. section 360bbb-3(b)(1), unless the authorization is terminated or revoked.  Performed at [facility], [place] [address] [place], [place] [address]   Culture, blood (routine x 2)     Status: None   Collection Time: 07/13/2021  6:25 PM    Specimen: BLOOD RIGHT ARM  Result Value Ref Range Status   Specimen Description BLOOD RIGHT ARM  Final   Special Requests   Final    BOTTLES DRAWN AEROBIC ONLY Blood Culture results may not be optimal due to an inadequate volume of blood received in culture bottles RIGHT LATERAL   Culture   Final    NO GROWTH 5 DAYS Performed at [facility], [place] [address] [place], [place] [address]    Report Status 07/22/2021 FINAL  Final  Culture, blood (routine x 2)     Status: None   Collection Time: 07/28/2021  6:40 PM   Specimen: BLOOD RIGHT HAND  Result Value Ref Range Status   Specimen Description BLOOD RIGHT HAND  Final   Special Requests   Final    BOTTLES DRAWN AEROBIC ONLY Blood Culture adequate volume   Culture   Final    NO GROWTH 5 DAYS Performed at [facility], [place] [address] [place], [place] [address]    Report Status 07/22/2021 FINAL  Final         Radiology Studies: No results found.      Scheduled Meds:  zolpidem  5 mg Oral QHS    Continuous Infusions:  sodium chloride       LOS: 7 days    Time spent: 20 minutes   Barb Merino, MD [facility] Pager [phone]

## 2021-07-24 NOTE — Care Management Important Message (Signed)
Important Message  Patient Details  Name: Cindy Burns MRN: [id number] Date of Birth: 09/08/1946   Medicare Important Message Given:  Yes     Orbie Pyo 07/24/2021, 12:13 PM

## 2021-07-25 DIAGNOSIS — N186 End stage renal disease: Secondary | ICD-10-CM | POA: Diagnosis not present

## 2021-07-25 DIAGNOSIS — R0602 Shortness of breath: Secondary | ICD-10-CM | POA: Diagnosis not present

## 2021-07-25 DIAGNOSIS — Z515 Encounter for palliative care: Secondary | ICD-10-CM | POA: Diagnosis not present

## 2021-07-25 MED ORDER — HYDROMORPHONE HCL 1 MG/ML IJ SOLN
0.5000 mg | INTRAMUSCULAR | Status: DC | PRN
  Administered 2021-07-25: 0.5 mg via INTRAVENOUS
  Filled 2021-07-25: qty 0.5

## 2021-08-01 ENCOUNTER — Other Ambulatory Visit: Payer: Self-pay | Admitting: Family Medicine

## 2021-08-07 NOTE — Progress Notes (Signed)
Patient ID: Cindy Burns, female   DOB: [birth year], 75 y.o.   MRN: [id number]  PROGRESS NOTE    Cindy Burns  TML:[id number] DOB: [birth year] DOA: 07/07/2021 PCP: [name], MD   Brief Narrative:   75 year old female with past medical history of ESRD on hemodialysis, COPD, tobacco use, type 2 diabetes, depression presented on 07/28/2021 with worsening shortness of breath after having not had any dialysis since 07/03/2021.  On presentation, potassium was 7.5, pH of 7.07.  She was initially admitted to ICU.  During the hospitalization, she desired to discontinue hemodialysis.  Palliative care was consulted.  She was started on comfort care measures on 07/19/2021.  Assessment & Plan:   Comfort care measures only status End-stage renal disease on hemodialysis Volume overload COPD Hyperkalemia Anemia of chronic disease  Plan -Continue full comfort care measures.  Patient refused referral to inpatient hospice. -Currently on as needed Dilaudid but getting more dyspneic.  If patient agreeable, can switch to morphine drip.   DVT prophylaxis: None for comfort measures Code Status: DNR Family Communication: None at bedside Disposition Plan: Status is: Inpatient  Remains inpatient appropriate because: Of end-of-life care Expect in-hospital death.   Consultants: PCCM/nephrology/palliative care  Procedures: None  Antimicrobials: None   Subjective: Patient seen and examined at bedside.  Sleepy, wakes up slightly, hardly participates in any conversation.  Objective: Vitals:   07/23/21 0833 07/24/21 0357 08-13-21 0355 Aug 13, 2021 0758  BP: (!) 148/59 (!) 132/51 (!) 61/36 (!) 61/33  Pulse: 84 91 72 73  Resp: 19 16 12 14   Temp: (!) 97.5 F (36.4 C) 98 F (36.7 C) (!) 97.5 F (36.4 C)   TempSrc: Oral Oral    SpO2: 100% 100% 98% 100%  Weight:      Height:        Intake/Output Summary (Last 24 hours) at 13-Aug-2021 1123 Last data filed at 08/13/21 1100 Gross per 24 hour   Intake 150 ml  Output 300 ml  Net -150 ml   Filed Weights   07/18/21 0500 07/19/21 0144 07/19/21 0330  Weight: 48.8 kg 49.5 kg 49.9 kg    Examination:  General exam: No distress.  Looks slightly dyspneic. Respiratory system: Bilateral decreased breath sounds at bases Cardiovascular system: S1 & S2 heard, Rate controlled  Data Reviewed: I have personally reviewed following labs and imaging studies  CBC: No results for input(s): WBC, NEUTROABS, HGB, HCT, MCV, PLT in the last 168 hours. Basic Metabolic Panel: Recent Labs  Lab 07/18/21 1527 07/19/21 0451  NA 140 139  K 4.7 5.2*  CL 102 104  CO2 15* 8*  GLUCOSE 132* 79  BUN 224* 201*  CREATININE 11.09* 10.38*  CALCIUM 7.4* 7.6*   GFR: Estimated Creatinine Clearance: 3.9 mL/min (A) (by C-G formula based on SCr of 10.38 mg/dL (H)). Liver Function Tests: No results for input(s): AST, ALT, ALKPHOS, BILITOT, PROT, ALBUMIN in the last 168 hours. No results for input(s): LIPASE, AMYLASE in the last 168 hours. No results for input(s): AMMONIA in the last 168 hours. Coagulation Profile: No results for input(s): INR, PROTIME in the last 168 hours. Cardiac Enzymes: No results for input(s): CKTOTAL, CKMB, CKMBINDEX, TROPONINI in the last 168 hours. BNP (last 3 results) No results for input(s): PROBNP in the last 8760 hours. HbA1C: No results for input(s): HGBA1C in the last 72 hours. CBG: Recent Labs  Lab 07/18/21 1513 07/18/21 1925 07/18/21 2338 07/19/21 0317 07/19/21 0743  GLUCAP 134* 89 80 86 88   Lipid  Profile: No results for input(s): CHOL, HDL, LDLCALC, TRIG, CHOLHDL, LDLDIRECT in the last 72 hours. Thyroid Function Tests: No results for input(s): TSH, T4TOTAL, FREET4, T3FREE, THYROIDAB in the last 72 hours. Anemia Panel: No results for input(s): VITAMINB12, FOLATE, FERRITIN, TIBC, IRON, RETICCTPCT in the last 72 hours. Sepsis Labs: No results for input(s): PROCALCITON, LATICACIDVEN in the last 168  hours.  Recent Results (from the past 240 hour(s))  Resp Panel by RT-PCR (Flu A&B, Covid) Nasopharyngeal Swab     Status: None   Collection Time: 07/23/2021 12:14 PM   Specimen: Nasopharyngeal Swab; Nasopharyngeal(NP) swabs in vial transport medium  Result Value Ref Range Status   SARS Coronavirus 2 by RT PCR NEGATIVE NEGATIVE Final    Comment: (NOTE) SARS-CoV-2 target nucleic acids are NOT DETECTED.  The SARS-CoV-2 RNA is generally detectable in upper respiratory specimens during the acute phase of infection. The lowest concentration of SARS-CoV-2 viral copies this assay can detect is 138 copies/mL. A negative result does not preclude SARS-Cov-2 infection and should not be used as the sole basis for treatment or other patient management decisions. A negative result may occur with  improper specimen collection/handling, submission of specimen other than nasopharyngeal swab, presence of viral mutation(s) within the areas targeted by this assay, and inadequate number of viral copies(<138 copies/mL). A negative result must be combined with clinical observations, patient history, and epidemiological information. The expected result is Negative.  Fact Sheet for Patients:  EntrepreneurPulse.com.au  Fact Sheet for Healthcare Providers:  IncredibleEmployment.be  This test is no t yet approved or cleared by the [place] FDA and  has been authorized for detection and/or diagnosis of SARS-CoV-2 by FDA under an Emergency Use Authorization (EUA). This EUA will remain  in effect (meaning this test can be used) for the duration of the COVID-19 declaration under Section 564(b)(1) of the Act, 21 U.S.C.section 360bbb-3(b)(1), unless the authorization is terminated  or revoked sooner.       Influenza A by PCR NEGATIVE NEGATIVE Final   Influenza B by PCR NEGATIVE NEGATIVE Final    Comment: (NOTE) The Xpert Xpress SARS-CoV-2/FLU/RSV plus assay is intended  as an aid in the diagnosis of influenza from Nasopharyngeal swab specimens and should not be used as a sole basis for treatment. Nasal washings and aspirates are unacceptable for Xpert Xpress SARS-CoV-2/FLU/RSV testing.  Fact Sheet for Patients: EntrepreneurPulse.com.au  Fact Sheet for Healthcare Providers: IncredibleEmployment.be  This test is not yet approved or cleared by the [place] FDA and has been authorized for detection and/or diagnosis of SARS-CoV-2 by FDA under an Emergency Use Authorization (EUA). This EUA will remain in effect (meaning this test can be used) for the duration of the COVID-19 declaration under Section 564(b)(1) of the Act, 21 U.S.C. section 360bbb-3(b)(1), unless the authorization is terminated or revoked.  Performed at [facility], [place] [address] [place], [place] [address]   Culture, blood (routine x 2)     Status: None   Collection Time: 07/30/2021  6:25 PM   Specimen: BLOOD RIGHT ARM  Result Value Ref Range Status   Specimen Description BLOOD RIGHT ARM  Final   Special Requests   Final    BOTTLES DRAWN AEROBIC ONLY Blood Culture results may not be optimal due to an inadequate volume of blood received in culture bottles RIGHT LATERAL   Culture   Final    NO GROWTH 5 DAYS Performed at [facility], [place] [address], [place], [place] [address]  Report Status 07/22/2021 FINAL  Final  Culture, blood (routine x 2)     Status: None   Collection Time: 08/03/2021  6:40 PM   Specimen: BLOOD RIGHT HAND  Result Value Ref Range Status   Specimen Description BLOOD RIGHT HAND  Final   Special Requests   Final    BOTTLES DRAWN AEROBIC ONLY Blood Culture adequate volume   Culture   Final    NO GROWTH 5 DAYS Performed at [facility], [address] [place], [place] [address]    Report Status 07/22/2021 FINAL  Final         Radiology Studies: No results found.      Scheduled Meds:   zolpidem  5 mg Oral QHS   Continuous Infusions:  sodium chloride            Aline August, MD [facility] 08/16/2021, 11:23 AM

## 2021-08-07 NOTE — Progress Notes (Signed)
   Palliative Medicine Inpatient Follow Up Note  Chart reviewed.  Contacted by RN this AM d/t increased work of breathing - changed order to increase frequency of PRN dilaudid. Discussed plan to visit patient later in day.   Went to bedside to evaluate patient - no one at bedside. Found that patient was not breathing. Unable to feel pulse. Alerted nurse that patient had passed away. RN to notify family/attending.   Objective Assessment: Vital Signs Vitals:   08-23-2021 0355 23-Aug-2021 0758  BP: (!) 61/36 (!) 61/33  Pulse: 72 73  Resp: 12 14  Temp: (!) 97.5 F (36.4 C)   SpO2: 98% 100%    Intake/Output Summary (Last 24 hours) at 23-Aug-2021 1253 Last data filed at 23-Aug-2021 1100 Gross per 24 hour  Intake 150 ml  Output 300 ml  Net -150 ml    Last Weight  Most recent update: 08-23-21 12:09 PM    Weight  49.9 kg (110 lb 0.2 oz)            CV: No pulse palpated PULM: No respiratory effort Neuro: unresponsive  SUMMARY OF RECOMMENDATIONS   Frequency of comfort medications increased this AM d/t uncontrolled symptoms Patient now deceased - RN alerted  Time Total: 15 minutes  Greater than 50%  of this time was spent counseling and coordinating care related to the above assessment and plan.  Juel Burrow, DNP, AGNP-C Palliative Medicine Team Team Phone # [phone]  Pager # [phone]   Palliative Medicine Team providers are available by phone from 7am to 7pm daily and can be reached through the team cell phone. Should this patient require assistance outside of these hours, please call the patient's attending physician.

## 2021-08-07 NOTE — Death Summary Note (Signed)
Death Summary  Cindy Burns UEA:[id number] DOB: [birth year] DOA: 08/04/21  PCP: [name], MD  Admit date: Aug 04, 2021 Date of Death: 08-12-21 Time of Death: 78   History of present illness:   75 year old female with past medical history of ESRD on hemodialysis, COPD, tobacco use, type 2 diabetes, depression presented on Aug 04, 2021 with worsening shortness of breath after having not had any dialysis since 07/03/2021.  On presentation, potassium was 7.5, pH of 7.07.  She was initially admitted to ICU.  During the hospitalization, she desired to discontinue hemodialysis.  Palliative care was consulted.  She was started on comfort care measures on 2021/08/06.  She passed away on August 12, 2021 at 11:50 AM.  Final Diagnoses:   Comfort care measures only status End-stage renal disease on hemodialysis Volume overload COPD Hyperkalemia Anemia of chronic disease   The results of significant diagnostics from this hospitalization (including imaging, microbiology, ancillary and laboratory) are listed below for reference.    Significant Diagnostic Studies: DG Chest 2 View  Result Date: 06/26/2021 CLINICAL DATA:  Recent seizure activity during dialysis, initial encounter EXAM: CHEST - 2 VIEW COMPARISON:  06/11/2021 FINDINGS: Check shadow is stable. Aortic calcifications are seen. Dialysis catheter is noted in satisfactory position. Lungs demonstrate chronic markings bilaterally stable in appearance from the prior exam. No focal infiltrate or effusion is seen. Degenerative changes of the thoracic spine are noted with stable lower thoracic compression deformity identified. IMPRESSION: Chronic changes as described without acute abnormality. Electronically Signed   By: Inez Catalina M.D.   On: 06/26/2021 16:31   CT HEAD WO CONTRAST (5MM)  Result Date: 06/26/2021 CLINICAL DATA:  Recent seizure activity during dialysis, initial encounter EXAM: CT HEAD WITHOUT CONTRAST TECHNIQUE: Contiguous axial  images were obtained from the base of the skull through the vertex without intravenous contrast. COMPARISON:  10/23/2020 FINDINGS: Brain: No evidence of acute infarction, hemorrhage, hydrocephalus, extra-axial collection or mass lesion/mass effect. Mild atrophic and ischemic changes are noted similar to that seen on prior exam Vascular: No hyperdense vessel or unexpected calcification. Skull: Normal. Negative for fracture or focal lesion. Sinuses/Orbits: No acute finding. Other: None. IMPRESSION: Chronic atrophic and ischemic changes stable from the prior exam. Electronically Signed   By: Inez Catalina M.D.   On: 06/26/2021 17:40   DG Chest Portable 1 View  Result Date: Aug 04, 2021 CLINICAL DATA:  Shortness of breath, missed dialysis EXAM: PORTABLE CHEST 1 VIEW COMPARISON:  Chest radiograph 06/26/2021 FINDINGS: A right-sided dialysis catheter is in stable position. The cardiomediastinal silhouette is stable. There is focal opacity projecting over the left midlung measuring 1.4 cm which is similar compared to the most recent prior study of 06/26/2021 but is increased in conspicuity compared to more remote prior studies. There is no focal consolidation. There is no evidence of pulmonary edema. There is no significant pleural effusion. There is no pneumothorax. There is no acute osseous abnormality. IMPRESSION: 1. No evidence of pulmonary edema. 2. Focal opacity projecting over the left midlung measuring 1.4 cm, unchanged compared to the most recent prior study but increased in conspicuity compared to more remote studies. Recommend characterization with nonemergent chest CT. Electronically Signed   By: Valetta Mole M.D.   On: 08/04/2021 12:47    Microbiology: Recent Results (from the past 240 hour(s))  Resp Panel by RT-PCR (Flu A&B, Covid) Nasopharyngeal Swab     Status: None   Collection Time: 2021/08/04 12:14 PM   Specimen: Nasopharyngeal Swab; Nasopharyngeal(NP) swabs in vial transport medium  Result Value  Ref Range Status   SARS Coronavirus 2 by RT PCR NEGATIVE NEGATIVE Final    Comment: (NOTE) SARS-CoV-2 target nucleic acids are NOT DETECTED.  The SARS-CoV-2 RNA is generally detectable in upper respiratory specimens during the acute phase of infection. The lowest concentration of SARS-CoV-2 viral copies this assay can detect is 138 copies/mL. A negative result does not preclude SARS-Cov-2 infection and should not be used as the sole basis for treatment or other patient management decisions. A negative result may occur with  improper specimen collection/handling, submission of specimen other than nasopharyngeal swab, presence of viral mutation(s) within the areas targeted by this assay, and inadequate number of viral copies(<138 copies/mL). A negative result must be combined with clinical observations, patient history, and epidemiological information. The expected result is Negative.  Fact Sheet for Patients:  EntrepreneurPulse.com.au  Fact Sheet for Healthcare Providers:  IncredibleEmployment.be  This test is no t yet approved or cleared by the [place] FDA and  has been authorized for detection and/or diagnosis of SARS-CoV-2 by FDA under an Emergency Use Authorization (EUA). This EUA will remain  in effect (meaning this test can be used) for the duration of the COVID-19 declaration under Section 564(b)(1) of the Act, 21 U.S.C.section 360bbb-3(b)(1), unless the authorization is terminated  or revoked sooner.       Influenza A by PCR NEGATIVE NEGATIVE Final   Influenza B by PCR NEGATIVE NEGATIVE Final    Comment: (NOTE) The Xpert Xpress SARS-CoV-2/FLU/RSV plus assay is intended as an aid in the diagnosis of influenza from Nasopharyngeal swab specimens and should not be used as a sole basis for treatment. Nasal washings and aspirates are unacceptable for Xpert Xpress SARS-CoV-2/FLU/RSV testing.  Fact Sheet for  Patients: EntrepreneurPulse.com.au  Fact Sheet for Healthcare Providers: IncredibleEmployment.be  This test is not yet approved or cleared by the [place] FDA and has been authorized for detection and/or diagnosis of SARS-CoV-2 by FDA under an Emergency Use Authorization (EUA). This EUA will remain in effect (meaning this test can be used) for the duration of the COVID-19 declaration under Section 564(b)(1) of the Act, 21 U.S.C. section 360bbb-3(b)(1), unless the authorization is terminated or revoked.  Performed at [facility], [place] [address], [place], [place] [address]   Culture, blood (routine x 2)     Status: None   Collection Time: 07/11/2021  6:25 PM   Specimen: BLOOD RIGHT ARM  Result Value Ref Range Status   Specimen Description BLOOD RIGHT ARM  Final   Special Requests   Final    BOTTLES DRAWN AEROBIC ONLY Blood Culture results may not be optimal due to an inadequate volume of blood received in culture bottles RIGHT LATERAL   Culture   Final    NO GROWTH 5 DAYS Performed at [facility], [place] [address], [place], [place] [address]    Report Status 07/22/2021 FINAL  Final  Culture, blood (routine x 2)     Status: None   Collection Time: 07/29/2021  6:40 PM   Specimen: BLOOD RIGHT HAND  Result Value Ref Range Status   Specimen Description BLOOD RIGHT HAND  Final   Special Requests   Final    BOTTLES DRAWN AEROBIC ONLY Blood Culture adequate volume   Culture   Final    NO GROWTH 5 DAYS Performed at [facility], [place] [address] [place], [place] [address]    Report Status 07/22/2021 FINAL  Final     Labs: Basic Metabolic Panel:  Recent Labs  Lab 07/18/21 1527 07/19/21 0451  NA 140 139  K 4.7 5.2*  CL 102 104  CO2 15* 8*  GLUCOSE 132* 79  BUN 224* 201*  CREATININE 11.09* 10.38*  CALCIUM 7.4* 7.6*   Liver Function Tests: No results for input(s): AST, ALT, ALKPHOS, BILITOT, PROT, ALBUMIN in the last 168  hours. No results for input(s): LIPASE, AMYLASE in the last 168 hours. No results for input(s): AMMONIA in the last 168 hours. CBC: No results for input(s): WBC, NEUTROABS, HGB, HCT, MCV, PLT in the last 168 hours. Cardiac Enzymes: No results for input(s): CKTOTAL, CKMB, CKMBINDEX, TROPONINI in the last 168 hours. D-Dimer No results for input(s): DDIMER in the last 72 hours. BNP: Invalid input(s): POCBNP CBG: Recent Labs  Lab 07/18/21 1513 07/18/21 1925 07/18/21 2338 07/19/21 0317 07/19/21 0743  GLUCAP 134* 89 80 86 88   Anemia work up No results for input(s): VITAMINB12, FOLATE, FERRITIN, TIBC, IRON, RETICCTPCT in the last 72 hours. Urinalysis    Component Value Date/Time   COLORURINE COLORLESS (A) 09/06/2020 0547   APPEARANCEUR TURBID (A) 09/06/2020 0547   LABSPEC 1.004 (L) 09/06/2020 0547   PHURINE 7.0 09/06/2020 0547   GLUCOSEU NEGATIVE 09/06/2020 0547   HGBUR SMALL (A) 09/06/2020 0547   HGBUR trace-intact 06/24/2008 0912   BILIRUBINUR NEGATIVE 09/06/2020 0547   BILIRUBINUR negative 09/23/2013 1559   KETONESUR NEGATIVE 09/06/2020 0547   PROTEINUR 100 (A) 09/06/2020 0547   UROBILINOGEN 0.2 09/23/2013 1559   UROBILINOGEN 0.2 06/24/2008 0912   NITRITE NEGATIVE 09/06/2020 0547   LEUKOCYTESUR MODERATE (A) 09/06/2020 0547   Sepsis Labs Invalid input(s): PROCALCITONIN,  WBC,  LACTICIDVEN     SIGNED:  Aline August, MD  [facility] Aug 23, 2021, 1:03 PM

## 2021-08-07 DEATH — deceased

## 2021-09-14 ENCOUNTER — Other Ambulatory Visit: Payer: Medicare Other

## 2021-09-21 ENCOUNTER — Ambulatory Visit: Payer: Medicare Other | Admitting: Family Medicine

## 2021-10-26 IMAGING — DX DG CHEST 1V PORT
1 series · 1 of 1 positions shown · non-contrast
Comparison: Chest radiograph 06/26/2021

CLINICAL DATA: Shortness of breath, missed dialysis

EXAM:
PORTABLE CHEST 1 VIEW

[chest]
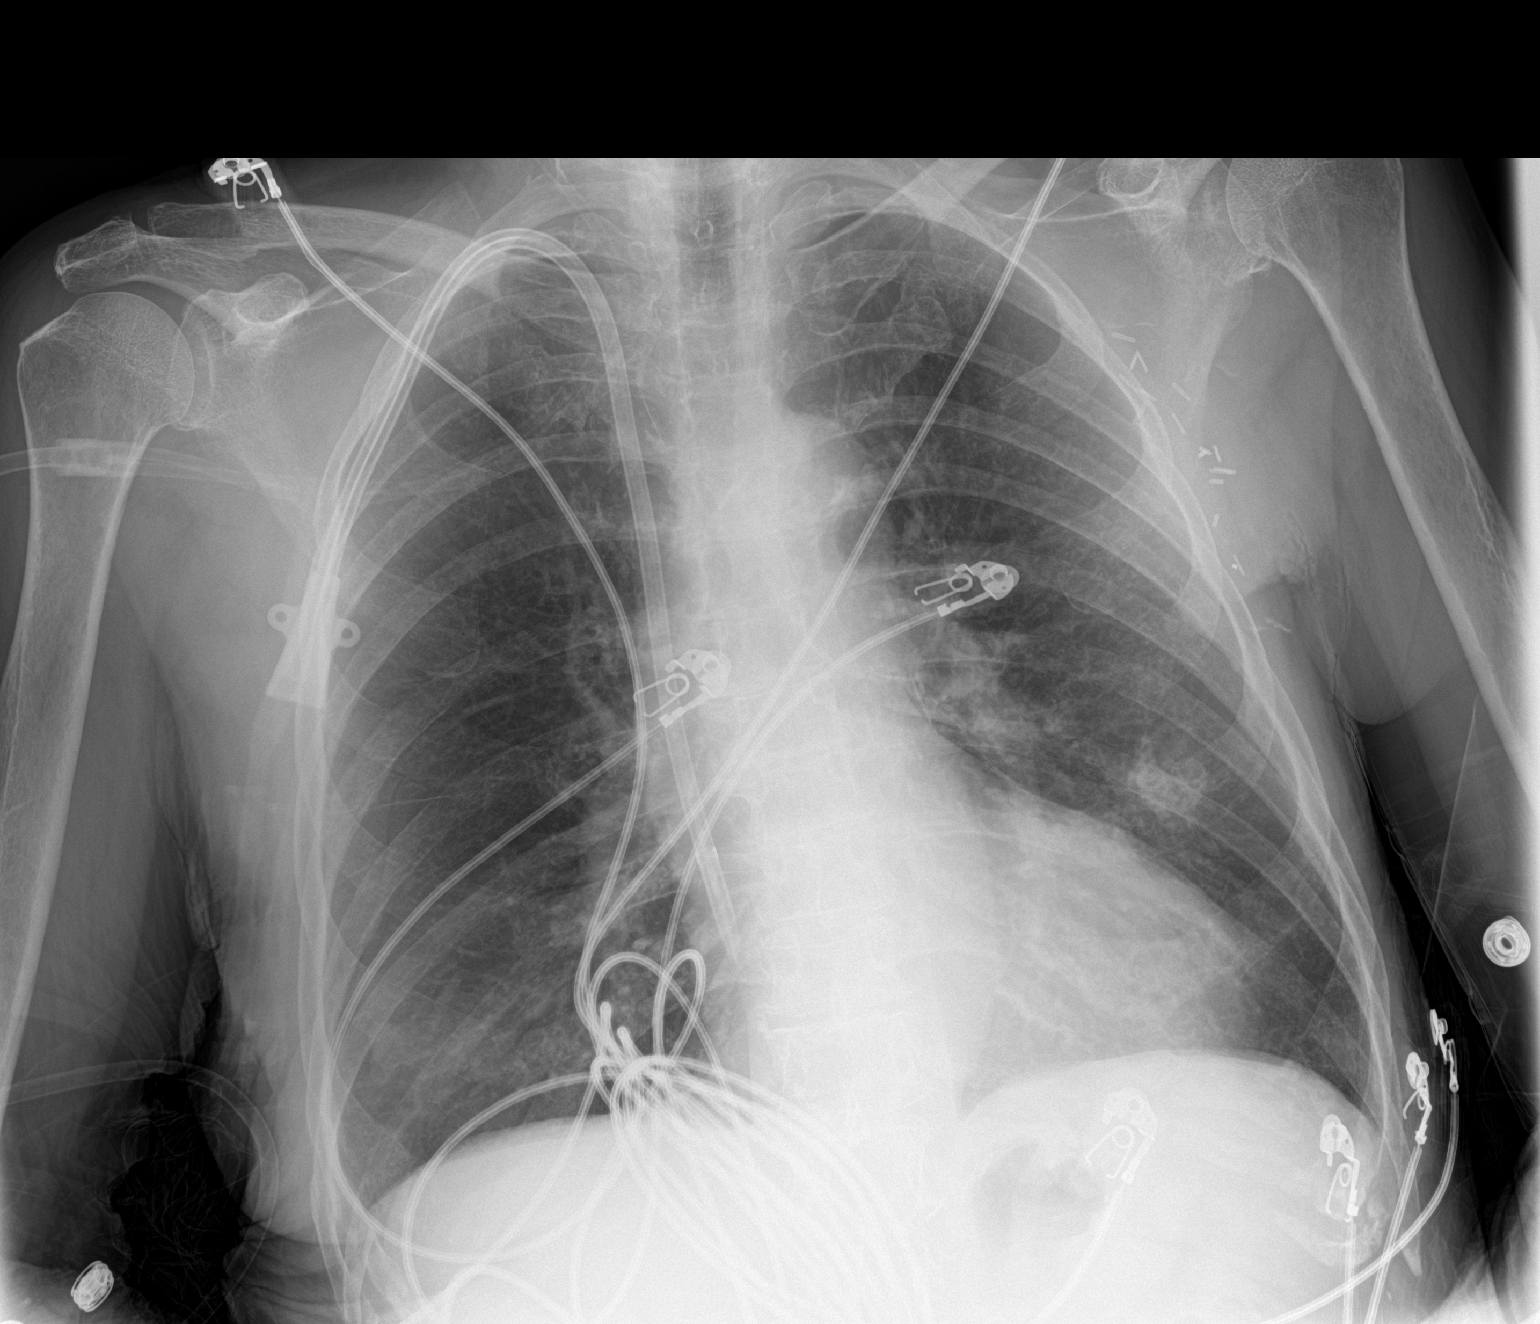

[1 of 1 positions shown; findings below may reference images not displayed]

FINDINGS: A right-sided dialysis catheter is in stable position.

The cardiomediastinal silhouette is stable.

There is focal opacity projecting over the left midlung measuring
1.4 cm which is similar compared to the most recent prior study of
06/26/2021 but is increased in conspicuity compared to more remote
prior studies. There is no focal consolidation. There is no evidence
of pulmonary edema. There is no significant pleural effusion. There
is no pneumothorax.

There is no acute osseous abnormality.
IMPRESSION: 1. No evidence of pulmonary edema.
2. Focal opacity projecting over the left midlung measuring 1.4 cm,
unchanged compared to the most recent prior study but increased in
conspicuity compared to more remote studies. Recommend
characterization with nonemergent chest CT.
